# Patient Record
Sex: Male | Born: 1945 | Race: White | Hispanic: No | Marital: Married | Smoking: Former smoker
Health system: Southern US, Community
[De-identification: ages and names within clinical notes are randomized; demographics above are authoritative.]

## PROBLEM LIST (undated history)

## (undated) DIAGNOSIS — M5136 Other intervertebral disc degeneration, lumbar region: Secondary | ICD-10-CM

## (undated) DIAGNOSIS — J449 Chronic obstructive pulmonary disease, unspecified: Secondary | ICD-10-CM

## (undated) DIAGNOSIS — M4712 Other spondylosis with myelopathy, cervical region: Secondary | ICD-10-CM

## (undated) DIAGNOSIS — I2510 Atherosclerotic heart disease of native coronary artery without angina pectoris: Secondary | ICD-10-CM

## (undated) DIAGNOSIS — N529 Male erectile dysfunction, unspecified: Secondary | ICD-10-CM

## (undated) DIAGNOSIS — I219 Acute myocardial infarction, unspecified: Secondary | ICD-10-CM

## (undated) DIAGNOSIS — I10 Essential (primary) hypertension: Secondary | ICD-10-CM

## (undated) DIAGNOSIS — G4700 Insomnia, unspecified: Secondary | ICD-10-CM

## (undated) DIAGNOSIS — G629 Polyneuropathy, unspecified: Secondary | ICD-10-CM

## (undated) DIAGNOSIS — R58 Hemorrhage, not elsewhere classified: Secondary | ICD-10-CM

## (undated) DIAGNOSIS — J439 Emphysema, unspecified: Secondary | ICD-10-CM

## (undated) DIAGNOSIS — G8929 Other chronic pain: Secondary | ICD-10-CM

## (undated) DIAGNOSIS — R200 Anesthesia of skin: Secondary | ICD-10-CM

## (undated) DIAGNOSIS — M549 Dorsalgia, unspecified: Secondary | ICD-10-CM

## (undated) DIAGNOSIS — C3491 Malignant neoplasm of unspecified part of right bronchus or lung: Secondary | ICD-10-CM

## (undated) DIAGNOSIS — K284 Chronic or unspecified gastrojejunal ulcer with hemorrhage: Secondary | ICD-10-CM

## (undated) DIAGNOSIS — Z970 Presence of artificial eye: Secondary | ICD-10-CM

## (undated) DIAGNOSIS — G709 Myoneural disorder, unspecified: Secondary | ICD-10-CM

## (undated) DIAGNOSIS — C801 Malignant (primary) neoplasm, unspecified: Secondary | ICD-10-CM

## (undated) DIAGNOSIS — Z6832 Body mass index (BMI) 32.0-32.9, adult: Secondary | ICD-10-CM

## (undated) DIAGNOSIS — E119 Type 2 diabetes mellitus without complications: Secondary | ICD-10-CM

## (undated) DIAGNOSIS — M4646 Discitis, unspecified, lumbar region: Secondary | ICD-10-CM

## (undated) DIAGNOSIS — J349 Unspecified disorder of nose and nasal sinuses: Secondary | ICD-10-CM

## (undated) DIAGNOSIS — H25019 Cortical age-related cataract, unspecified eye: Secondary | ICD-10-CM

## (undated) DIAGNOSIS — K219 Gastro-esophageal reflux disease without esophagitis: Secondary | ICD-10-CM

## (undated) DIAGNOSIS — G4730 Sleep apnea, unspecified: Secondary | ICD-10-CM

## (undated) DIAGNOSIS — H547 Unspecified visual loss: Secondary | ICD-10-CM

## (undated) DIAGNOSIS — R350 Frequency of micturition: Secondary | ICD-10-CM

## (undated) DIAGNOSIS — E669 Obesity, unspecified: Secondary | ICD-10-CM

## (undated) DIAGNOSIS — I700 Atherosclerosis of aorta: Secondary | ICD-10-CM

## (undated) DIAGNOSIS — K274 Chronic or unspecified peptic ulcer, site unspecified, with hemorrhage: Secondary | ICD-10-CM

## (undated) DIAGNOSIS — D649 Anemia, unspecified: Secondary | ICD-10-CM

## (undated) DIAGNOSIS — Z720 Tobacco use: Secondary | ICD-10-CM

## (undated) DIAGNOSIS — M51369 Other intervertebral disc degeneration, lumbar region without mention of lumbar back pain or lower extremity pain: Secondary | ICD-10-CM

## (undated) HISTORY — PX: TONSILLECTOMY AND ADENOIDECTOMY: SUR1326

## (undated) HISTORY — PX: GALLBLADDER SURGERY: SHX652

## (undated) HISTORY — DX: Atherosclerotic heart disease of native coronary artery without angina pectoris: I25.10

## (undated) HISTORY — DX: Anemia, unspecified: D64.9

## (undated) HISTORY — PX: BACK SURGERY: SHX140

## (undated) HISTORY — DX: Tobacco use: Z72.0

## (undated) HISTORY — DX: Chronic obstructive pulmonary disease, unspecified: J44.9

## (undated) HISTORY — DX: Unspecified visual loss: H54.7

## (undated) HISTORY — DX: Unspecified disorder of nose and nasal sinuses: J34.9

## (undated) HISTORY — PX: FEMUR FRACTURE SURGERY: SHX633

## (undated) HISTORY — PX: CHOLECYSTECTOMY: SHX55

## (undated) HISTORY — PX: OTHER SURGICAL HISTORY: SHX169

## (undated) HISTORY — PX: ANTERIOR CERVICAL DECOMP/DISCECTOMY FUSION: SHX1161

## (undated) HISTORY — DX: Discitis, unspecified, lumbar region: M46.46

## (undated) HISTORY — PX: NASAL RECONSTRUCTION: SHX2069

## (undated) HISTORY — PX: FOOT SURGERY: SHX648

## (undated) HISTORY — DX: Gastro-esophageal reflux disease without esophagitis: K21.9

---

## 1965-03-23 HISTORY — PX: FRACTURE SURGERY: SHX138

## 1965-06-21 DIAGNOSIS — Z9289 Personal history of other medical treatment: Secondary | ICD-10-CM

## 1965-06-21 HISTORY — DX: Personal history of other medical treatment: Z92.89

## 2000-03-23 HISTORY — PX: NECK SURGERY: SHX720

## 2000-09-10 ENCOUNTER — Ambulatory Visit (HOSPITAL_COMMUNITY)
Admission: RE | Admit: 2000-09-10 | Discharge: 2000-09-10 | Payer: Self-pay | Admitting: Physical Medicine and Rehabilitation

## 2000-09-10 ENCOUNTER — Encounter: Payer: Self-pay | Admitting: Physical Medicine and Rehabilitation

## 2000-11-01 ENCOUNTER — Ambulatory Visit (HOSPITAL_COMMUNITY): Admission: RE | Admit: 2000-11-01 | Discharge: 2000-11-01 | Payer: Self-pay | Admitting: Specialist

## 2000-11-01 ENCOUNTER — Encounter: Payer: Self-pay | Admitting: Specialist

## 2000-12-09 ENCOUNTER — Encounter: Payer: Self-pay | Admitting: Specialist

## 2000-12-15 ENCOUNTER — Encounter: Payer: Self-pay | Admitting: Specialist

## 2000-12-15 ENCOUNTER — Inpatient Hospital Stay (HOSPITAL_COMMUNITY): Admission: RE | Admit: 2000-12-15 | Discharge: 2000-12-16 | Payer: Self-pay | Admitting: Specialist

## 2001-04-08 ENCOUNTER — Encounter: Admission: RE | Admit: 2001-04-08 | Discharge: 2001-04-08 | Payer: Self-pay | Admitting: Specialist

## 2001-04-08 ENCOUNTER — Encounter: Payer: Self-pay | Admitting: Specialist

## 2004-06-10 ENCOUNTER — Ambulatory Visit (HOSPITAL_BASED_OUTPATIENT_CLINIC_OR_DEPARTMENT_OTHER): Admission: RE | Admit: 2004-06-10 | Discharge: 2004-06-10 | Payer: Self-pay | Admitting: Orthopedic Surgery

## 2004-06-10 ENCOUNTER — Ambulatory Visit (HOSPITAL_COMMUNITY): Admission: RE | Admit: 2004-06-10 | Discharge: 2004-06-10 | Payer: Self-pay | Admitting: Orthopedic Surgery

## 2004-06-22 ENCOUNTER — Emergency Department (HOSPITAL_COMMUNITY): Admission: EM | Admit: 2004-06-22 | Discharge: 2004-06-22 | Payer: Self-pay | Admitting: Emergency Medicine

## 2006-12-10 ENCOUNTER — Ambulatory Visit: Payer: Self-pay | Admitting: Unknown Physician Specialty

## 2007-04-12 ENCOUNTER — Emergency Department: Payer: Self-pay | Admitting: Emergency Medicine

## 2007-04-12 ENCOUNTER — Other Ambulatory Visit: Payer: Self-pay

## 2007-04-17 ENCOUNTER — Emergency Department (HOSPITAL_COMMUNITY): Admission: EM | Admit: 2007-04-17 | Discharge: 2007-04-17 | Payer: Self-pay | Admitting: Emergency Medicine

## 2007-10-04 ENCOUNTER — Ambulatory Visit (HOSPITAL_BASED_OUTPATIENT_CLINIC_OR_DEPARTMENT_OTHER): Admission: RE | Admit: 2007-10-04 | Discharge: 2007-10-04 | Payer: Self-pay | Admitting: Orthopedic Surgery

## 2008-03-13 ENCOUNTER — Ambulatory Visit: Payer: Self-pay | Admitting: Urology

## 2008-10-04 ENCOUNTER — Ambulatory Visit: Payer: Self-pay | Admitting: Internal Medicine

## 2010-06-05 ENCOUNTER — Ambulatory Visit: Payer: Self-pay | Admitting: Vascular Surgery

## 2010-08-05 NOTE — Op Note (Signed)
NAMEORVA, GWALTNEY              ACCOUNT NO.:  [id number]   MEDICAL RECORD NO.:  [id number]          PATIENT TYPE:  AMB   LOCATION:  DSC                          FACILITY:  [facility]   PHYSICIAN:  Leonides Grills, M.D.     DATE OF BIRTH:  24-Jan-1946   DATE OF PROCEDURE:  10/04/2007  DATE OF DISCHARGE:                               OPERATIVE REPORT   PREOPERATIVE DIAGNOSES:  1. Left insertional Achilles tendinitis, with calcification.  2. Left tight gastrocnemius.  3. Left Haglund deformity.   POSTOPERATIVE DIAGNOSES:  1. Left insertional Achilles tendinitis, with calcification.  2. Left tight gastrocnemius.  3. Left Haglund deformity.   OPERATION:  1. Left excision, Haglund deformity.  2. Left gastrocnemius slide.  3. Left primary repaired of Achilles tendon.   ANESTHESIA:  General.   SURGEON:  Leonides Grills, MD   ASSISTANT:  Richardean Canal, PA-C   ESTIMATED BLOOD LOSS:  Minimal.   TOURNIQUET TIME:  Approximately 1 hour 15 minutes.   COMPLICATIONS:  None.   DISPOSITION:  Stable to PAR.   INDICATIONS:  This is a 64 year old male who has had longstanding left  posterior heel pain that was interfering with his life to the point  where he could not do what he wants to do despite conservative  management.  He was consented to the above procedure.  All risks, which  include infection, nerve or vessel injury, Achilles tendon rupture,  persistent pain, worsening pain, prolonged recovery, and wound  complications were all explained.  Questions were encouraged and  answered.   OPERATION:  The patient was brought to the operating room and placed in  supine position initially after adequate general endotracheal anesthesia  was administered as well as popliteal block and Ancef 1 g IV piggyback.  The patient was then placed in a prone position.  All bony prominences  were well padded.  Chest bolsters were placed.  The left lower extremity  was prepped and draped in sterile manner with  a proximally placed thigh  tourniquet.  We started the procedure with a longitudinal incision in  the medial aspect of gastrocnemius muscle tendinous junction.  Dissection was carried down through skin.  Hemostasis was obtained.  Fascia was opened in line with the incision.  Conjoined region was then  developed between the gastrocnemius and soleus.  Soft tissue was  elevated off the posterior aspect of gastrocnemius.  Sural nerve was  identified and protected posteriorly.  Gastrocnemius was then released  with a curved Mayo scissors.  This had excellent release of tight  gastrocnemius.  The area was copiously irrigated with normal saline.  Subcu was closed with 3-0 Vicryl.  Skin was closed with 4-0 Monocryl  subcuticular stitch.  Steri-Strips were applied.  Limb was then gravity  exsanguinated and tourniquet was elevated to 290 mmHg.  An anterolateral  and anteromedial incision over the Achilles tendon was then made.  Dissection was carried down through skin.  Hemostasis was obtained.  Achilles tendon was then incised both anteromedially and anterolaterally  and was elevated off the calcaneal tuber over the Haglund deformity.  This was  then removed with a sagittal saw through, both the medial and  lateral wounds.  We then carefully dissected out the calcification  within the Achilles tendon.  This was done sharply with a scalpel as  well as a Therapist, nutritional.  We then removed this with a curved 1/4-inch  osteotome and rongeur.  This was then palpated and noted to be  adequately decompressed.  The area was copiously irrigated with normal  saline.  We then placed two 5.5-mm bioabsorbable Arthrotec suture  anchors with #2 FiberWire near the insertion of the Achilles tendon and  this was done through the standard Arthrotec technique and then we used  a crisscross type of suture configuration similar to repairs on rotator  cuff repairs with the 4 suture limbs from the 2 absorbable suture   anchors respectively.  We then placed two PushLock suture anchors using  a knotless technique.  This was then palpated through the skin and this  was adequately decompressed.  There were no palpable knots and no  palpable prominence.  Tourniquet was deflated and hemostasis was  obtained.  Also, of note, care was taken to tunnel the sutures directly  on top of the Achilles tendon and once the tourniquet was deflated and  hemostasis was obtained, the anteromedial and anterolateral edges of the  Achilles tendon were repaired with 2-0 FiberWire stitch.  Subcu was  closed with 3-0 Vicryl.  Skin was closed with 4-0 nylon over both  wounds.  Sterile dressing was applied.  Modified Jones dressing was  applied in gravity equinus.  The patient was stable to the PAR.      Leonides Grills, M.D.  Electronically Signed     PB/MEDQ  D:  10/04/2007  T:  10/05/2007  Job:  [id number]

## 2010-08-08 NOTE — Op Note (Signed)
[facility]  Patient:    Howard Maxwell, Howard Maxwell Visit Number: [id number] MRN: [id number]          Service Type: SUR Location: [id number] Attending Physician:  Lubertha South Dictated by:   Kerrin Champagne, M.D. Proc. Date: 12/15/00 Admit Date:  12/15/2000 Discharge Date: 12/16/2000                             Operative Report  PREOPERATIVE DIAGNOSES:  Left C3-4 uncovertebral spur with left C4-C5 nerve root impingement, left C6-7 herniated nucleus pulposus with left C7 nerve root impingement. 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  PROCEDURE:  Anterior diskectomy and fusion at C6-7 using a 28 mm EBI plate with a 9 mm long graft dense cancellous bone graft. Anterior diskectomy and fusion at C3-4 using a 26 mm EBI plate and screws and an 8 mm long graft. Two 14 mm screws were used throughout the neck. A 4.0 screw at each segment with the exception of the left lower C4 screwing being a 5 mm screw.  SURGEON:  Kerrin Champagne, M.D.  ASSISTANT:  Beverely Low, M.D.  ANESTHESIA:  GOT, Dr. Rica Mast.  ESTIMATED BLOOD LOSS:  150 cc.  DRAINS:  TLS 7 mm x 1 left neck and Foley to straight drain.  BRIEF CLINICAL NOTE:  The patient is a 65 year old man who has had persistent neck pain and radiation to the left upper extremity. He has undergone numerous attempts at selective nerve root block without relief of the pain. A cervical MRI demonstrating significant left C3-4 spondylosis changes with spur affecting the left foramen as well as flattening the left side of the cord and a C6-7 disk degeneration. MRI scan was followed by a myelogram which demonstrated  disk herniation, left sided C6-7 both soft and hard disk material and a left C3-4 hard disk protrusion. EMGs, nerve conduction studies demonstrated no significant cervical radiculopathy. No sign of carpal tunnel syndrome. After attempts at initial management with selective nerve blocks, physical therapy felt that surgical intervention was necessary and diminished the patients pain and improved function. The patient was brought to the operating room to undergo a left C3-4 foraminotomy, decompression and uncovertebral spondylosis changes with fusion and a left sided C7 nerve root decompression with anterior diskectomy and fusion at this site.  INTRAOPERATIVE FINDINGS:  The patient was found to have a very large spur on the left side at C3-4 affecting the left C4 nerve root and causing mass effect on the left side of the cervical cord. He was found to have hard disk protrusion with soft disk protrusion at the left C6-7 level causing primarily C7 nerve root entrapment.  DESCRIPTION OF PROCEDURE:  After adequate general anesthesia, the patient in the beach chair position at the neck and 5 pounds cervical halter traction with Mayfield horseshoe and a roll transverse in the midline between the shoulder blades to allow for exposure of the neck. Standard prep with Duraprep solution, arms well padded at the sides. Draped in the usual manner, iodine impregnated Vidrape with standard preoperative antibiotics. The incision over the left anterior sternocleidomastoid approximately 9-10 cm in length through the skin and subcutaneous layers  down to the platysmal layer. Electrocautery used to control bleeders. The platysmal layer then incised in line with the skin incision. The interval had developed between the trachea and esophagus medially and across the sheath laterally to the anterior aspect of the cervical spine at the expected C6-7 level.  Prevertebral fascia then teased across the midline using a  Kidner dissector and spinal needle inserted at the expected C6-7 level on the left side. Interval between the trachea and esophagus then developed more superiorly using blunt dissection to the anterior aspect of the cervical spine where spurs were evident.  This was felt to represent the C3-4 level. Another spinal needle was inserted at this level. Once the spinal needles were inserted with the sheath attached only allowing about a centimeter of the needle to be inserted. Intraoperative C-arm fluoro was then used to inspect the neck and examine the neck. On my review, it was observed that the upper needle was at the C3-4 level. The lower needle was found to be present at the C5-6 level. With this then being the case then the patient had removal of the upper needle under direct observation using hand held Clowards and a small portion of the disk excised using a 15 blade scalpel and pituitary rongeur. The lower needle was left in place and examination of the neck, a Kidner dissector was used to tease and expose further caudally down to the next level. A second spinal needle was then inserted and C-arm fluoro again used on lateral view to demonstrate that this was at the C6-7 level. With this being the case then the C-arm was removed from the field, the upper spinal needle at the lower segment C5-6 was then removed under direct observation.  The lower needle was then removed under careful observation and retraction using the hand held Clowards and a 15 blade scalpel used to incise the disk at the C5-6 level. The disk was found to be quite narrowed with anterior spurs evident.  This being the case then the medial border of the longus colli muscle was carefully freed up bilaterally using electrocautery and Key elevator. Bipolar electrocautery used to control bleeders. A McCullough retractor was then inserted with the foot of the retractor beneath the medial border of the longus colli muscle  which provided exposure of the anterior aspect of the cervical spine at the C6-7 level. A high speed bur was then brought into the  field and then under loop magnification, the anterior lip spurs were excised using a high speed bur and the disk space identified. Pituitary rongeurs and curettes then used to identify the disk space and remove disk material in the anterior two-thirds of the disk.  The Casper distraction system was inserted using a 14 mm screw post. These were inserted in both the vertebral body of C7 and C6 and distraction obtained across the intervertebral disk space. The operating room microscope was then draped and brought onto the field. Under the operating room microscope then the microcurettes were then used to excise the cartilaginous endplates over the superior aspect of C7 and the inferior aspect of C6. The disk was then removed back to the posterior longitudinal ligament.  A 1 mm Kerrison was then introduced over the posterior aspect of the posterior superior lip of C7 and then this bone bridge was resected from the right side to left side and the posterior annular fibers resected similarly.  Foraminotomy then performed in the left neuroforamen using 1 mm and then 2 mm  Kerrisons opening the neuroforamen widely and decompressing the left C7 nerve root. A titanium nerve hook was then used to free up the left side of the posterior longitudinal ligament and then to carefully peel it off the left C7 nerve root and the left side of the cervical cord. There was bony attachment to the posterior longitudinal ligament and this was resected using both titanium nerve hook and the micropituitary rongeur. Following this, the left neuroforamen and the left sided thecal sac was found to be completely free. The high speed bur was then used to carefully bur the endplates at the both the inferior aspect of C6 and superior aspect of C7. Sounding was performed in the disk space and a  9 mm long graft was found to fit best. This was then inserted after careful inspection of the depth demonstrated a depth of nearly 15 mm at the C6-7 level. The graft was inserted, impacted into place under distraction. Distraction was then released ______ holes and then coated with bone wax to obtain hemostasis. The anterior aspect of the cervical spine and the anterior aspect of C6 and C7 was then exposed using electrocautery to cauterize soft tissue anteriorly. Then debriding this using a high speed bur over both the mid and the lower portion of C6 and the upper and mid portion of C7. The expected height for the intrascrew distance was measured using bone wax applied to cottonoid string placed over the anterior aspect of the cervical spine measuring approximately 24 mm between screw holes. A 28 mm plate was chosen. The plate was then placed against the anterior aspect of the cervical spine at the C6-7 level after burring allowed for removal of osteophytes and smooth opposition of the plate to the anterior aspect of the cervical spine at this level. The plate was fixed in the upper left side with a retaining screw. Then using the drill sleeve, a drill hole was placed first in the left lower side 14 mm in depth and a 14 mm 4.0 screw placed. This was self tapping. Similarly the lower right side was placed and after drilling 14 mm, a 4.0 screw was placed. Next, the single retaining screw was removed, 5 p pounds cervical traction released and the two upper screws were then drilled in place allowing for compression of the graft and excellent opposition of the plate against the anterior aspect of the cervical spine. Irrigation was then performed and a sponge placed into the incision to allow for further inspection of this region later. Attention was then directed to the C3-4 level where hand held Clowards were inserted. The medial border of the longus colli muscle freed up bilaterally and then  the [facility] retractor inserted over the foot of the retractor beneath the medial border of the longus colli muscle bilaterally. Excellent exposure was obtained. Screw posts were then inserted in the anterior aspect of the vertebral body of C3 and C4 and distraction obtained across the intervertebral disk space. A 15 blade scalpel was then used to incise the anterior aspect of the disk space at the C3-4 level. Pituitary rongeurs and currettes were then used to debride the disk space of the intervertebral disk material.  The operating room microscope was then brought into the field under direct observation and the endplates both in the inferior aspect of C3 and over the superior aspect of C4 were debrided of cartilaginous material down to bone.  The high speed bur was used to further open the intervertebral disk space allowing  exposure of the posterior aspect of the disk space. A 1 mm Kerrison was then able to be introduced and placed over the posterior superior lip of C4 and used to resect bony bar here towards the right side to the midline and then brought back towards the left side resecting spur off the posterior superior aspect of C4. This was then used to further enter the neuroforamen on the left side over the pedicle and left C4 performing an initial foraminotomy on this side. The high speed bur was then used to thin the posterior aspect of the posterior inferior aspect of the vertebral body of C3. This was used to thin the spur off the left side posteriorly and left posterior uncovertebral joint. Once this was thin, a house curette as well as 1 mm Kerrison was then used to resect the spur along the left side decompressing the left neuroforamen and left C4 nerve root. The left side of the thecal sac was found to be completely decompressed. Irrigation was performed. The high speed bur was then used to carefully bur to bleeding bony endplates both in the inferior aspect of C3 and  the superior aspect of C4. Sounding the intervertebral disk space measured 8 mm long. Irrigation again was performed. Careful inspection demonstrated no soft tissue including retropulsion with insertion of the graft and a distraction graft was then inserted back into place. The depth of the intervertebral disk space was measured at 18 mm using Cloward depth gauge. This was done prior to insertion of graft. Following insertion of the graft,  distraction was released, the screw posts removed at C3 and C4. Bone wax applied to the bleeding screw post holes to obtain hemostasis. The high speed bur then used to carefully bur the anterior osteophytes off the anterior aspect of the vertebral body of C4 and the anterior aspect of C3. Cautery was used to control bleeders.  The height of the expected plate was then measured using similarly cottonoid string and a 26 mm plate was chosen. The plate placed across the anterior aspect of the cervical spine at the C3-4 level. A single retention screw placed on the left side at the C4 level. This held the blade in place and a screw was then placed on the right side at the C4 level using a 14 mm drill and then a 4.0, 14 mm screw was placed. Distraction was released on the intervertebral disk space, 5 pounds cervical traction released and the two screws then placed at the C3 level superiorly both right and left side first drilling and then using appropriate self tapping 4.0, 14 mm screw. Then the retention screws removed on the left side at C4 and a drill performed using a 14 mm drill. A 5.0 mm screw was then placed on the left side at the this level. All of these obtained excellent purchase. A [place]field 4 was then used to carefully ensure that the washers all spun around the heads of the screws to ensure that they captured appropriately and locked to the plate appropriately. This was done at C3-4 and then down at C6-7 as well. Irrigation was  performed. Intraoperative radiograph using a C-arm fluoro demonstrated the plates at Z6-1 in excellent position and alignment and bone graft showing excellent distraction of the disk space without evidence of retropulsion. At C6-7 the plates and screws appear to be in excellent position and alignment with no evidence of screw penetration in the canal and the bone graft appeared to be in good  position and alignment without evidence of retropulsion. Irrigation was performed above C3-4 and C6-7. There was no active bleeding evident. A single 7.0 mm TLS drain was then placed in the depth of the incision down deeply superiorly exiting out a stab incision over the anterior lower aspect of the neck and left side. The platysmal layer was then approximated to the left side using interrupted 2-0 Vicryl sutures. The deep subcu layer was approximated with interrupted 2-0 Vicryl sutures and 3-0 Vicryl sutures and the more superficial layers with interrupted 3-0 Vicryl suture and the skin closed with a running subcu stitch of 4-0 Vicryl. Tinctured Benzoin and Steri-Strips applied, 4 x 4s affixed to the skin with hypofix tape, TLS drain charge. The patient placed in a [place] collar and he was then reactivated and returned to the recovery room in satisfactory condition. All instrument and sponge counts were correct. Dictated by:   Kerrin Champagne, M.D. Attending Physician:  Lubertha South DD:  12/15/00 TD:  12/16/00 Job: [id number]
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
## 2010-08-08 NOTE — Op Note (Signed)
Howard Maxwell, FORE              ACCOUNT NO.:  [id number]   MEDICAL RECORD NO.:  [id number]          PATIENT TYPE:  AMB   LOCATION:  DSC                          FACILITY:  [facility]   PHYSICIAN:  Leonides Grills, M.D.     DATE OF BIRTH:  May 15, 1945   DATE OF PROCEDURE:  06/10/2004  DATE OF DISCHARGE:                                 OPERATIVE REPORT   PREOPERATIVE DIAGNOSES:  1.  Right Haglund deformity.  2.  Right tight gastrocnemius.  3.  Right Achilles tendon tendinosis/rupture.   POSTOPERATIVE DIAGNOSES:  1.  Right Haglund deformity.  2.  Right tight gastrocnemius.  3.  Right Achilles tendon tendinosis/rupture.   OPERATION:  1.  Excision, right Haglund deformity.  2.  Stress x-rays, right ankle.  3.  Right gastrocnemius slide.  4.  Primary repair, right Achilles tendon.   ANESTHESIA:  General.   SURGEON:  Durene Romans. Lestine Box, MD   ASSISTANT:  Lianne Cure, P.A.   ESTIMATED BLOOD LOSS:  Minimal.   TOURNIQUET TIME:  Approximately an hour.   COMPLICATIONS:  None.   DISPOSITION:  Stable to the PAR.   INDICATIONS:  This is a 65 year old male who has had chronic posterior right  heel pain that was interfering with his life to the point that he cannot do  what he wants to do despite conservative management.  He was consented for  the above procedure.  All risks, which include infection, nerve or vessel  injury, Achilles tendon rupture, persistent pain, worsening pain, prolonged  recovery, weakness and contracture, were all explained, questions were  encouraged and answered.   OPERATION:  The patient was brought to the operating room and placed in the  supine position after adequate general endotracheal tube anesthesia was  administered as well as Ancef 1 g IV piggyback.  The patient was then placed  in a prone position and the right lower extremity was then prepped and  draped in a sterile manner over a proximally-placed thigh tourniquet.  We  started the procedure with  a longitudinal incision over the medial  gastrocnemius muscle-tendinous junction.  Dissection was carried down  through skin and hemostasis was obtained.  The fascia was opened in line  with the incision and soft tissue was elevated off the posterior aspect of  the gastrocnemius tendon.  The conjoint region was then developed between  the gastroc and soleus musculature.  The sural nerve was identified and  protected in the posterior aspect of the knee with the knee retractor.  The  gastrocnemius was then released with a Mayo scissors.  That released the  tight gastrocnemius.  The area was copiously irrigated and the subcu was  closed with 3-0 Vicryl, the skin was closed with 4-0 Monocryl subcuticular  stitch.  Steri-Strips were applied.  The tourniquet was then elevated to 290  mmHg.  Two longitudinal incisions on either side of the Achilles tendon were  then made.  Dissection was carried down directly to bone.  Tendon and soft  tissue were elevated off the posterior aspect of the Haglund area, i.e.,  calcaneal tuber.  The  retrocalcaneal bursa was then removed, as well as  calcification within the Achilles tendon.  The Achilles tendon was  completely debrided during this procedure.  This took quite some time.  We  then obtained a sagittal saw and then cut the Haglund's deformity out using  a sagittal saw as well as a straight osteotome.  We then obtained stress x-  rays in the lateral plane that showed that this was adequately decompressed  and no gross movement of loose bone in this area.  This was then copiously  irrigated with normal saline.  We then assessed the Achilles tendon and it  was quite diseased, and portions of it were torn.  Because of this, we  elected to repair it down to the Haglund deformity excision.  We then  obtained an Arthrotec 5.5 mm suture anchor and placed this with standard  technique with #2 Fibrewire.  We put two of these in, one medially and one  laterally.   This had excellent repair of the tendon back down to the exposed  cancellous bone.  We then, once we tied this down, we then buried the stitch  using Arthrotec slide anchors through a percutaneous longitudinal slit in  the Achilles tendon and used a crisscross hatch technique to help hold the  Achilles tendon down flush to the exposed calcaneal tuber.  This was done  through subcutaneous tunnel on the posterior aspect of the Achilles tendon.  This was again placed in the standard technique and had an outstanding  repair.  There was no dimpling of the skin.  The Achilles was flush down.  There was no prominence posteriorly.  The area was copiously irrigated with  normal saline.  The tourniquet was deflated and hemostasis obtained.  The  skin was closed with 4-0 nylon suture.  Sterile dressings applied.  Modified  Jones dressing applied with the ankle in gravity equinus.  The patient was  stable to the PAR.      PB/MEDQ  D:  06/10/2004  T:  06/10/2004  Job:  [id number]

## 2010-12-18 LAB — POCT I-STAT, CHEM 8
BUN: 12
Calcium, Ion: 1 — ABNORMAL LOW
Chloride: 104
Creatinine, Ser: 1
Glucose, Bld: 149 — ABNORMAL HIGH
HCT: 46
Hemoglobin: 15.6
Potassium: 3.9
Sodium: 138
TCO2: 25

## 2010-12-18 LAB — POCT HEMOGLOBIN-HEMACUE: Hemoglobin: 16.5

## 2012-03-10 ENCOUNTER — Ambulatory Visit: Payer: Self-pay | Admitting: Unknown Physician Specialty

## 2012-10-19 ENCOUNTER — Encounter (HOSPITAL_COMMUNITY): Payer: Self-pay | Admitting: *Deleted

## 2012-10-19 DIAGNOSIS — E119 Type 2 diabetes mellitus without complications: Secondary | ICD-10-CM | POA: Insufficient documentation

## 2012-10-19 DIAGNOSIS — M545 Low back pain, unspecified: Secondary | ICD-10-CM | POA: Insufficient documentation

## 2012-10-19 DIAGNOSIS — Z79899 Other long term (current) drug therapy: Secondary | ICD-10-CM | POA: Insufficient documentation

## 2012-10-19 DIAGNOSIS — F17200 Nicotine dependence, unspecified, uncomplicated: Secondary | ICD-10-CM | POA: Insufficient documentation

## 2012-10-19 DIAGNOSIS — R52 Pain, unspecified: Secondary | ICD-10-CM | POA: Insufficient documentation

## 2012-10-19 DIAGNOSIS — I10 Essential (primary) hypertension: Secondary | ICD-10-CM | POA: Insufficient documentation

## 2012-10-19 LAB — CBC WITH DIFFERENTIAL/PLATELET
Basophils Absolute: 0.1 10*3/uL (ref 0.0–0.1)
Basophils Relative: 1 % (ref 0–1)
Eosinophils Absolute: 0.3 10*3/uL (ref 0.0–0.7)
Eosinophils Relative: 4 % (ref 0–5)
HCT: 45 % (ref 39.0–52.0)
Hemoglobin: 15.2 g/dL (ref 13.0–17.0)
Lymphocytes Relative: 32 % (ref 12–46)
Lymphs Abs: 2.6 10*3/uL (ref 0.7–4.0)
MCH: 31.5 pg (ref 26.0–34.0)
MCHC: 33.8 g/dL (ref 30.0–36.0)
MCV: 93.4 fL (ref 78.0–100.0)
Monocytes Absolute: 0.6 10*3/uL (ref 0.1–1.0)
Monocytes Relative: 7 % (ref 3–12)
Neutro Abs: 4.7 10*3/uL (ref 1.7–7.7)
Neutrophils Relative %: 57 % (ref 43–77)
Platelets: 205 10*3/uL (ref 150–400)
RBC: 4.82 MIL/uL (ref 4.22–5.81)
RDW: 13.7 % (ref 11.5–15.5)
WBC: 8.3 10*3/uL (ref 4.0–10.5)

## 2012-10-19 LAB — COMPREHENSIVE METABOLIC PANEL
ALT: 16 U/L (ref 0–53)
AST: 15 U/L (ref 0–37)
Albumin: 3.9 g/dL (ref 3.5–5.2)
Alkaline Phosphatase: 86 U/L (ref 39–117)
BUN: 14 mg/dL (ref 6–23)
CO2: 27 mEq/L (ref 19–32)
Calcium: 9.3 mg/dL (ref 8.4–10.5)
Chloride: 100 mEq/L (ref 96–112)
Creatinine, Ser: 0.96 mg/dL (ref 0.50–1.35)
GFR calc Af Amer: >90 mL/min (ref >90)
GFR calc non Af Amer: 84 mL/min — ABNORMAL LOW (ref >90)
Glucose, Bld: 127 mg/dL — ABNORMAL HIGH (ref 70–99)
Potassium: 3.7 mEq/L (ref 3.5–5.1)
Sodium: 138 mEq/L (ref 135–145)
Total Bilirubin: 0.3 mg/dL (ref 0.3–1.2)
Total Protein: 7 g/dL (ref 6.0–8.3)

## 2012-10-19 LAB — URINALYSIS, ROUTINE W REFLEX MICROSCOPIC
Glucose, UA: NEGATIVE mg/dL
Hgb urine dipstick: NEGATIVE
Ketones, ur: 15 mg/dL — AB
Leukocytes, UA: NEGATIVE
Nitrite: NEGATIVE
Protein, ur: NEGATIVE mg/dL
Specific Gravity, Urine: 1.026 (ref 1.005–1.030)
Urobilinogen, UA: 1 mg/dL (ref 0.0–1.0)
pH: 6 (ref 5.0–8.0)

## 2012-10-19 NOTE — ED Notes (Signed)
Pt c/o lower back pain for the past 6 hours..  No n or v

## 2012-10-20 ENCOUNTER — Emergency Department (HOSPITAL_COMMUNITY)
Admission: EM | Admit: 2012-10-20 | Discharge: 2012-10-20 | Disposition: A | Discharge status: Home / Self Care | Payer: Medicare Other | Attending: Emergency Medicine | Admitting: Emergency Medicine

## 2012-10-20 DIAGNOSIS — M545 Low back pain, unspecified: Secondary | ICD-10-CM

## 2012-10-20 HISTORY — DX: Essential (primary) hypertension: I10

## 2012-10-20 HISTORY — DX: Type 2 diabetes mellitus without complications: E11.9

## 2012-10-20 MED ORDER — OXYCODONE-ACETAMINOPHEN 5-325 MG PO TABS: 1.0000 | ORAL_TABLET | ORAL | Status: DC | PRN

## 2012-10-20 MED ORDER — DIAZEPAM 5 MG PO TABS
10.0000 mg | ORAL_TABLET | Freq: Once | ORAL | Status: AC
Start: 2012-10-20 — End: 2012-10-20
  Administered 2012-10-20: 10 mg via ORAL
  Filled 2012-10-20: qty 2

## 2012-10-20 MED ORDER — OXYCODONE-ACETAMINOPHEN 5-325 MG PO TABS
1.0000 | ORAL_TABLET | Freq: Once | ORAL | Status: AC
  Administered 2012-10-20: 1 via ORAL
  Filled 2012-10-20: qty 1

## 2012-10-20 MED ORDER — IBUPROFEN 800 MG PO TABS
800.0000 mg | ORAL_TABLET | Freq: Once | ORAL | Status: AC
  Administered 2012-10-20: 800 mg via ORAL
  Filled 2012-10-20: qty 1

## 2012-10-20 MED ORDER — DIAZEPAM 10 MG PO TABS: 10.0000 mg | ORAL_TABLET | Freq: Three times a day (TID) | ORAL | Status: DC | PRN

## 2012-10-20 NOTE — ED Provider Notes (Signed)
CSN: [id number]     Arrival date & time 10/19/12  2132 History     First MD Initiated Contact with Patient 10/20/12 0026     Chief Complaint  Patient presents with  . Back Pain   (Consider location/radiation/quality/duration/timing/severity/associated sxs/prior Treatment) Patient is a 67 y.o. male presenting with back pain. The history is provided by the patient.  Back Pain He states that he took a nap this afternoon when he woke up, he had severe pain in his right paralumbar area which radiated down into the right thigh. At rest, pain is dull and throbbing. When he tries to move, pain gets worse and becomes sharp. There is no associated numbness or tingling or weakness. He denies any bowel or bladder dysfunction. He has had back pain in the past and has had injections in his back for arthritis of the spine. He denies any recent trauma. He states he only lifting he did was putting a light weight wheelchair into his car.  Past Medical History  Diagnosis Date  . Diabetes mellitus without complication   . Hypertension    History reviewed. No pertinent past surgical history. No family history on file. History  Substance Use Topics  . Smoking status: Current Every Day Smoker  . Smokeless tobacco: Not on file  . Alcohol Use: No    Review of Systems  Musculoskeletal: Positive for back pain.  All other systems reviewed and are negative.    Allergies  Review of patient's allergies indicates not on file.  Home Medications   Current Outpatient Rx  Name  Route  Sig  Dispense  Refill  . amLODipine (NORVASC) 5 MG tablet   Oral   Take 5 mg by mouth daily.         Marland Kitchen etodolac (LODINE) 400 MG tablet   Oral   Take 400 mg by mouth 2 (two) times daily.         . hydrochlorothiazide (HYDRODIURIL) 25 MG tablet   Oral   Take 25 mg by mouth daily.         Marland Kitchen lisinopril (PRINIVIL,ZESTRIL) 20 MG tablet   Oral   Take 20 mg by mouth 2 (two) times daily.         . metFORMIN  (GLUCOPHAGE) 500 MG tablet   Oral   Take 500 mg by mouth 2 (two) times daily with a meal.         . Multiple Vitamin (MULTIVITAMIN WITH MINERALS) TABS   Oral   Take 1 tablet by mouth daily.         Marland Kitchen omeprazole (PRILOSEC) 20 MG capsule   Oral   Take 20 mg by mouth daily.         . Probiotic Product (PROBIOTIC DAILY PO)   Oral   Take 1 tablet by mouth 2 (two) times daily.          BP 156/68  Pulse 60  Temp(Src) 98.6 F (37 C)  Resp 22  SpO2 98% Physical Exam  Nursing note and vitals reviewed.  67 year old male, resting comfortably and in no acute distress. Vital signs are significant for hypertension with blood pressure 136/68, and tachypnea with respiratory rate of 22. Oxygen saturation is 98%, which is normal. Head is normocephalic and atraumatic. PERRLA, EOMI. Oropharynx is clear. Neck is nontender and supple without adenopathy or JVD. Back is nontender in the midline, but there is right paralumbar tenderness and spasm. Straight leg raise is positive on the right at  30. There is a positive crossed straight leg raise on the left at 30. Lungs are clear without rales, wheezes, or rhonchi. Chest is nontender. Heart has regular rate and rhythm without murmur. Abdomen is soft, flat, nontender without masses or hepatosplenomegaly and peristalsis is normoactive. Extremities have no cyanosis or edema, full range of motion is present. Skin is warm and dry without rash. Neurologic: Mental status is normal, cranial nerves are intact, there are no motor or sensory deficits.  ED Course   Procedures (including critical care time)  Results for orders placed during the hospital encounter of 10/20/12  URINALYSIS, ROUTINE W REFLEX MICROSCOPIC      Result Value Range   Color, Urine YELLOW  YELLOW   APPearance CLEAR  CLEAR   Specific Gravity, Urine 1.026  1.005 - 1.030   pH 6.0  5.0 - 8.0   Glucose, UA NEGATIVE  NEGATIVE mg/dL   Hgb urine dipstick NEGATIVE  NEGATIVE    Bilirubin Urine LARGE (*) NEGATIVE   Ketones, ur 15 (*) NEGATIVE mg/dL   Protein, ur NEGATIVE  NEGATIVE mg/dL   Urobilinogen, UA 1.0  0.0 - 1.0 mg/dL   Nitrite NEGATIVE  NEGATIVE   Leukocytes, UA NEGATIVE  NEGATIVE  CBC WITH DIFFERENTIAL      Result Value Range   WBC 8.3  4.0 - 10.5 K/uL   RBC 4.82  4.22 - 5.81 MIL/uL   Hemoglobin 15.2  13.0 - 17.0 g/dL   HCT 16.1  09.6 - 04.5 %   MCV 93.4  78.0 - 100.0 fL   MCH 31.5  26.0 - 34.0 pg   MCHC 33.8  30.0 - 36.0 g/dL   RDW 40.9  81.1 - 91.4 %   Platelets 205  150 - 400 K/uL   Neutrophils Relative % 57  43 - 77 %   Neutro Abs 4.7  1.7 - 7.7 K/uL   Lymphocytes Relative 32  12 - 46 %   Lymphs Abs 2.6  0.7 - 4.0 K/uL   Monocytes Relative 7  3 - 12 %   Monocytes Absolute 0.6  0.1 - 1.0 K/uL   Eosinophils Relative 4  0 - 5 %   Eosinophils Absolute 0.3  0.0 - 0.7 K/uL   Basophils Relative 1  0 - 1 %   Basophils Absolute 0.1  0.0 - 0.1 K/uL  COMPREHENSIVE METABOLIC PANEL      Result Value Range   Sodium 138  135 - 145 mEq/L   Potassium 3.7  3.5 - 5.1 mEq/L   Chloride 100  96 - 112 mEq/L   CO2 27  19 - 32 mEq/L   Glucose, Bld 127 (*) 70 - 99 mg/dL   BUN 14  6 - 23 mg/dL   Creatinine, Ser 7.82  0.50 - 1.35 mg/dL   Calcium 9.3  8.4 - 95.6 mg/dL   Total Protein 7.0  6.0 - 8.3 g/dL   Albumin 3.9  3.5 - 5.2 g/dL   AST 15  0 - 37 U/L   ALT 16  0 - 53 U/L   Alkaline Phosphatase 86  39 - 117 U/L   Total Bilirubin 0.3  0.3 - 1.2 mg/dL   GFR calc non Af Amer 84 (*) >90 mL/min   GFR calc Af Amer >90  >90 mL/min    1. Acute lumbar back pain     MDM  Acute lumbar strain with sciatica. There is no indication for emergent imaging. He is given a dose of  diazepam, ibuprofen, and oxycodone-acetaminophen and will be reassessed.  He got very good relief of pain with the above-noted medications. He is on etodolac at home but has not been taking it on a routine basis. He is advised to start taking it twice a day and is sent home with prescriptions  for oxycodone acetaminophen, and diazepam.  Dione Booze, MD 10/20/12 [phone]

## 2012-12-27 ENCOUNTER — Encounter: Payer: Self-pay | Admitting: Podiatrist

## 2012-12-27 NOTE — Progress Notes (Deleted)
Subjective:     Patient ID: Howard Maxwell, male   DOB: 10/28/1945, 67 y.o.   MRN: [id number]  HPI   Review of Systems     Objective:   Physical Exam     Assessment:     ***    Plan:     ***

## 2012-12-27 NOTE — Progress Notes (Signed)
Patient picked up 2nd pair of orthotics.

## 2013-02-07 ENCOUNTER — Ambulatory Visit (INDEPENDENT_AMBULATORY_CARE_PROVIDER_SITE_OTHER): Payer: 59 | Admitting: Podiatrist

## 2013-02-07 ENCOUNTER — Encounter (INDEPENDENT_AMBULATORY_CARE_PROVIDER_SITE_OTHER): Payer: Self-pay

## 2013-02-07 ENCOUNTER — Encounter: Payer: Self-pay | Admitting: Podiatrist

## 2013-02-07 VITALS — BP 132/83 | HR 61 | Resp 18

## 2013-02-07 DIAGNOSIS — M216X9 Other acquired deformities of unspecified foot: Secondary | ICD-10-CM

## 2013-02-07 DIAGNOSIS — Q828 Other specified congenital malformations of skin: Secondary | ICD-10-CM

## 2013-02-07 DIAGNOSIS — M216X2 Other acquired deformities of left foot: Secondary | ICD-10-CM

## 2013-02-07 MED ORDER — HYDROCODONE-ACETAMINOPHEN 5-325 MG PO TABS: 1.0000 | ORAL_TABLET | ORAL | Status: DC | PRN

## 2013-02-07 NOTE — Progress Notes (Signed)
  Subjective: Patient presents today for followup of calloused area submetatarsal 3, 4 of the left foot. He states it's been real sore and he has a friend who referred him to Dr. Lajoyce Corners whom he did see for a consult who recommended a shortening metatarsal osteotomy of metatarsals 2, 3, 4 all the left foot. He relates that the orthotics have helped some however he still gets the pain occurs after the callus gets large. Objective: Neurovascular status is intact and unchanged to the left foot. A large hyperkeratotic lesion is present submetatarsal 3, 4 of the left foot it is painful and symptomatic with pressure. The previous biopsy showed a hyperkeratotic lesion. Assessment: Plantarflexed metatarsal with porokeratotic lesion x1 Plan: Discussed surgical and conservative therapies and treatments. I did agree that conservative therapies have been exhausted with debridement of the lesion and orthotics. Discussed Dr. Burna Sis surgical plan versus my own  thought on performing a dorsiflexion osteotomy of either the third or fourth metatarsals after looking at his x-rays with the patient. He will consider his surgical options and will be seen back when necessary.

## 2013-04-06 ENCOUNTER — Encounter: Payer: Self-pay | Admitting: Podiatrist

## 2013-04-06 ENCOUNTER — Ambulatory Visit (INDEPENDENT_AMBULATORY_CARE_PROVIDER_SITE_OTHER): Payer: Medicare Other | Admitting: Podiatrist

## 2013-04-06 VITALS — BP 152/80 | HR 93 | Resp 16 | Ht 70.0 in | Wt <= 1120 oz

## 2013-04-06 DIAGNOSIS — Q828 Other specified congenital malformations of skin: Secondary | ICD-10-CM

## 2013-04-06 DIAGNOSIS — M216X9 Other acquired deformities of unspecified foot: Secondary | ICD-10-CM

## 2013-04-06 DIAGNOSIS — M216X2 Other acquired deformities of left foot: Secondary | ICD-10-CM

## 2013-04-06 NOTE — Progress Notes (Signed)
Subjective: Patient presents today for followup of calloused area submetatarsal 3, 4 of the left foot. He states it's been real sore and he relates that the orthotics have helped some however he still gets the pain occurs after the callus gets large. We have spoken in the past about surgery to elevate the metatarsals and his also spoken to Dr. Sharol Given about the surgery as well.  Objective: Neurovascular status is intact and unchanged to the left foot. A large hyperkeratotic lesion is present submetatarsal 3, 4 of the left foot it is painful and symptomatic with pressure. The previous biopsy showed a hyperkeratotic lesion.   Assessment: Plantarflexed metatarsal with porokeratotic lesion x1   Plan: Discussed surgical and conservative therapies and treatments. Today we did a thorough debridement of the lesion with a #15 blade without complication. We did briefly again discussed the surgery and the patient's considering this as well. He'll be seen back as needed for followup and if any problems or concerns arise he will call.

## 2013-12-04 ENCOUNTER — Emergency Department: Payer: Self-pay | Admitting: Emergency Medicine

## 2013-12-04 LAB — BASIC METABOLIC PANEL
Anion Gap: 8 (ref 7–16)
BUN: 16 mg/dL (ref 7–18)
Calcium, Total: 9.1 mg/dL (ref 8.5–10.1)
Chloride: 103 mmol/L (ref 98–107)
Co2: 27 mmol/L (ref 21–32)
Creatinine: 1.18 mg/dL (ref 0.60–1.30)
EGFR (African American): >60
EGFR (Non-African Amer.): >60
Glucose: 214 mg/dL — ABNORMAL HIGH (ref 65–99)
Osmolality: 283 (ref 275–301)
Potassium: 3.7 mmol/L (ref 3.5–5.1)
Sodium: 138 mmol/L (ref 136–145)

## 2013-12-04 LAB — CBC
HCT: 47.1 % (ref 40.0–52.0)
HGB: 15.3 g/dL (ref 13.0–18.0)
MCH: 31.3 pg (ref 26.0–34.0)
MCHC: 32.5 g/dL (ref 32.0–36.0)
MCV: 96 fL (ref 80–100)
Platelet: 202 10*3/uL (ref 150–440)
RBC: 4.9 10*6/uL (ref 4.40–5.90)
RDW: 13.6 % (ref 11.5–14.5)
WBC: 12.4 10*3/uL — ABNORMAL HIGH (ref 3.8–10.6)

## 2013-12-05 LAB — PROTIME-INR
INR: 1.1
Prothrombin Time: 13.6 secs (ref 11.5–14.7)

## 2013-12-05 LAB — PRO B NATRIURETIC PEPTIDE: B-Type Natriuretic Peptide: 253 pg/mL — ABNORMAL HIGH (ref 0–125)

## 2013-12-05 LAB — TROPONIN I
Troponin-I: 0.06 ng/mL — ABNORMAL HIGH
Troponin-I: 0.08 ng/mL — ABNORMAL HIGH

## 2013-12-06 ENCOUNTER — Telehealth: Payer: Self-pay | Admitting: *Deleted

## 2013-12-06 ENCOUNTER — Encounter (INDEPENDENT_AMBULATORY_CARE_PROVIDER_SITE_OTHER): Payer: Self-pay

## 2013-12-06 ENCOUNTER — Encounter: Payer: Self-pay | Admitting: Cardiovascular Disease

## 2013-12-06 ENCOUNTER — Ambulatory Visit (INDEPENDENT_AMBULATORY_CARE_PROVIDER_SITE_OTHER): Payer: Medicare Other | Admitting: Cardiovascular Disease

## 2013-12-06 VITALS — BP 149/91 | HR 84 | Ht 70.0 in | Wt 257.2 lb

## 2013-12-06 DIAGNOSIS — R0789 Other chest pain: Secondary | ICD-10-CM

## 2013-12-06 DIAGNOSIS — I200 Unstable angina: Secondary | ICD-10-CM

## 2013-12-06 DIAGNOSIS — Z720 Tobacco use: Secondary | ICD-10-CM | POA: Insufficient documentation

## 2013-12-06 DIAGNOSIS — I10 Essential (primary) hypertension: Secondary | ICD-10-CM

## 2013-12-06 NOTE — Patient Instructions (Addendum)
[facility] Cardiac Cath Instructions   You are scheduled for a Cardiac Cath on:__9/17/15_______________________  Please arrive at _0630______am on the day of your procedure  You will need to pre-register prior to the day of your procedure.  Enter through the [employer] at [facility].  Registration is the first desk on your right.  Please take the procedure order we have given you in order to be registered appropriately  Do not eat/drink anything after midnight  Someone will need to drive you home  It is recommended someone be with you for the first 24 hours after your procedure  Wear clothes that are easy to get on/off and wear slip on shoes if possible   Medications bring a current list of all medications with you   __x_ Do not take these medications before your procedure: Hold Metformin today, tomorrow and two after your procedure. Hold Hydrochlorothiazide the day of your procedure.      Day of your procedure: Arrive at the [facility] entrance.  Free valet service is available.  After entering the [place] please check-in at the registration desk (1st desk on your right) to receive your armband. After receiving your armband someone will escort you to the cardiac cath/special procedures waiting area.  The usual length of stay after your procedure is about 2 to 3 hours.  This can vary.  If you have any questions, please call our office at [phone], or you may call the cardiac cath lab at [facility] directly at [phone]

## 2013-12-06 NOTE — Assessment & Plan Note (Signed)
The patient's symptoms are highly suggestive of unstable angina with mildly elevated troponin. He has multiple risk factors for coronary artery disease. I recommend proceeding with urgent cardiac catheterization. I discussed with him the alternative option of proceeding with stress testing. However, his pretest probability for underlying obstructive coronary artery disease is high and thus stress testing is unlikely to be helpful. He is to continue aspirin 81 mg once daily. I discussed the procedure in details as well as all potential risks associated with the procedure. I asked him to avoid any physical exertion until further instructions after cardiac catheterization which will be scheduled tomorrow.

## 2013-12-06 NOTE — Telephone Encounter (Signed)
Informed patient to arrive for cath 12/07/13 at 0630 am  Patient verbalized understanding   Cath orders faxed  Angie conformed receipt

## 2013-12-06 NOTE — Progress Notes (Signed)
Primary care physician: [name]  HPI  This is a pleasant 68 year old man who was referred from the emergency room at [facility] : [facility] for evaluation of chest pain. He has no previous cardiac history. He has known history of type 2 diabetes diagnosed in 2010, hypertension, tobacco use and obesity. He also has family history of coronary artery disease. 2 days ago he was mowing his yard and was stung by yellow jackets multiple times. He had a local skin reaction. Later that day, he started having substernal chest tightness located on the left side with radiation to the left shoulder. It was associated with shortness of breath, dizziness and sweating. This lasted for about 24 hours. He went to the emergency room at [facility]. His troponin was found to be mildly elevated at 0.08 with a BNP of 253. ECG showed no significant ST changes. Chest x-ray showed no acute cardiopulmonary process. He was discharged home with a close followup. He has not done much physical activities since then.  Allergies  Allergen Reactions  . Codeine Sulfate     Other reaction(s): Unknown Onset 02/19/1999.  Marland Kitchen Loratadine     Other reaction(s): Unknown Onset 02/19/1999.     Current Outpatient Prescriptions on File Prior to Visit  Medication Sig Dispense Refill  . amLODipine (NORVASC) 5 MG tablet Take 5 mg by mouth daily.      . hydrochlorothiazide (HYDRODIURIL) 25 MG tablet Take 25 mg by mouth daily.      Marland Kitchen lisinopril (PRINIVIL,ZESTRIL) 20 MG tablet Take 20 mg by mouth 2 (two) times daily.      . metFORMIN (GLUCOPHAGE) 500 MG tablet Take 500 mg by mouth 2 (two) times daily with a meal.      . Multiple Vitamin (MULTIVITAMIN WITH MINERALS) TABS Take 1 tablet by mouth daily.      Marland Kitchen omeprazole (PRILOSEC) 20 MG capsule Take 20 mg by mouth daily.      . Probiotic Product (PROBIOTIC DAILY PO) Take 1 tablet by mouth 2 (two) times daily.       No current facility-administered medications on file prior to visit.     Past Medical History    Diagnosis Date  . Diabetes mellitus without complication   . Sinus problem   . Blind     one eye left  . GERD (gastroesophageal reflux disease)   . COPD (chronic obstructive pulmonary disease)   . Hypertension   . Tobacco use      Past Surgical History  Procedure Laterality Date  . Foot surgery Bilateral   . Anterior cervical decomp/discectomy fusion    . Gallbladder surgery    . Femur fracture surgery    . Eye surgery    . Nasal reconstruction       Family History  Problem Relation Age of Onset  . Family history unknown: Yes     History   Social History  . Marital Status: Married    Spouse Name: N/A    Number of Children: N/A  . Years of Education: N/A   Occupational History  . Not on file.   Social History Main Topics  . Smoking status: Current Every Day Smoker -- 1.00 packs/day    Types: Cigarettes  . Smokeless tobacco: Never Used  . Alcohol Use: No  . Drug Use: No  . Sexual Activity: Not on file   Other Topics Concern  . Not on file   Social History Narrative  . No narrative on file     ROS A  10 point review of system was performed. It is negative other than that mentioned in the history of present illness.   PHYSICAL EXAM   BP 149/91  Pulse 84  Ht 5\' 10"  (1.778 m)  Wt 257 lb 4 oz (116.688 kg)  BMI 36.91 kg/m2 Constitutional: He is oriented to person, place, and time. He appears well-developed and well-nourished. No distress.  HENT: No nasal discharge.  Head: Normocephalic and atraumatic.  Eyes: Pupils are equal and round.  No discharge. Neck: Normal range of motion. Neck supple. No JVD present. No thyromegaly present.  Cardiovascular: Normal rate, regular rhythm, normal heart sounds. Exam reveals no gallop and no friction rub. No murmur heard.  Pulmonary/Chest: Effort normal and breath sounds normal. No stridor. No respiratory distress. He has no wheezes. He has no rales. He exhibits no tenderness.  Abdominal: Soft. Bowel sounds are  normal. He exhibits no distension. There is no tenderness. There is no rebound and no guarding.  Musculoskeletal: Normal range of motion. He exhibits no edema and no tenderness.  Neurological: He is alert and oriented to person, place, and time. Coordination normal.  Skin: Skin is warm and dry. No rash noted. He is not diaphoretic. No erythema. No pallor.  Psychiatric: He has a normal mood and affect. His behavior is normal. Judgment and thought content normal.       EKG: Sinus  Rhythm  -First degree A-V block  PRi = 232 -No significant ST or T wave changes Isolated Q wave in a 3 likely physiologic  ABNORMAL    ASSESSMENT AND PLAN

## 2013-12-06 NOTE — Assessment & Plan Note (Signed)
Blood pressure is mildly elevated. I will consider adding a beta blocker based on results of cardiac catheterization. Hold hydrochlorothiazide tomorrow.

## 2013-12-07 ENCOUNTER — Ambulatory Visit: Payer: Self-pay | Admitting: Cardiovascular Disease

## 2013-12-07 ENCOUNTER — Telehealth: Payer: Self-pay | Admitting: *Deleted

## 2013-12-07 DIAGNOSIS — I2510 Atherosclerotic heart disease of native coronary artery without angina pectoris: Secondary | ICD-10-CM

## 2013-12-07 HISTORY — PX: CARDIAC CATHETERIZATION: SHX172

## 2013-12-07 MED ORDER — CLOPIDOGREL BISULFATE 75 MG PO TABS: 75.0000 mg | ORAL_TABLET | Freq: Every day | ORAL | Status: DC

## 2013-12-07 MED ORDER — PANTOPRAZOLE SODIUM 40 MG PO TBEC: 40.0000 mg | DELAYED_RELEASE_TABLET | Freq: Every day | ORAL | Status: DC

## 2013-12-07 MED ORDER — ATORVASTATIN CALCIUM 40 MG PO TABS: 40.0000 mg | ORAL_TABLET | Freq: Every day | ORAL | Status: DC

## 2013-12-07 NOTE — Telephone Encounter (Signed)
Per telephone call from Dr. Fletcher Anon  Patient needs to be started on Plavix 75 mg once daily  Atorvastatin 40 mg once daily

## 2013-12-07 NOTE — Telephone Encounter (Signed)
Patient returned call and verbalized understanding.

## 2013-12-07 NOTE — Telephone Encounter (Signed)
Per verbal from Dr. Fletcher Anon  Change Omeprazole to Protonix 40 mg once daily   LVM to inform patient

## 2013-12-08 ENCOUNTER — Encounter: Payer: Self-pay | Admitting: Cardiovascular Disease

## 2013-12-13 ENCOUNTER — Encounter: Payer: Self-pay | Admitting: *Deleted

## 2013-12-14 ENCOUNTER — Ambulatory Visit (INDEPENDENT_AMBULATORY_CARE_PROVIDER_SITE_OTHER): Payer: Medicare Other | Admitting: Cardiovascular Disease

## 2013-12-14 ENCOUNTER — Encounter: Payer: Self-pay | Admitting: Cardiovascular Disease

## 2013-12-14 VITALS — BP 140/84 | HR 70 | Ht 70.0 in | Wt 255.5 lb

## 2013-12-14 DIAGNOSIS — F17200 Nicotine dependence, unspecified, uncomplicated: Secondary | ICD-10-CM

## 2013-12-14 DIAGNOSIS — Z9889 Other specified postprocedural states: Secondary | ICD-10-CM

## 2013-12-14 DIAGNOSIS — Z720 Tobacco use: Secondary | ICD-10-CM

## 2013-12-14 DIAGNOSIS — E785 Hyperlipidemia, unspecified: Secondary | ICD-10-CM | POA: Insufficient documentation

## 2013-12-14 DIAGNOSIS — I10 Essential (primary) hypertension: Secondary | ICD-10-CM

## 2013-12-14 DIAGNOSIS — I2510 Atherosclerotic heart disease of native coronary artery without angina pectoris: Secondary | ICD-10-CM | POA: Insufficient documentation

## 2013-12-14 NOTE — Assessment & Plan Note (Signed)
Blood pressure is reasonably controlled on current medications. 

## 2013-12-14 NOTE — Progress Notes (Signed)
Primary care physician: [name]  HPI  This is a pleasant 68 year old man who is here today for a followup visit regarding coronary artery disease.  He has known history of type 2 diabetes diagnosed in 2010, hypertension, tobacco use and obesity. He also has family history of coronary artery disease. He was seen recently at the emergency room for chest pain with borderline elevated troponin.  ECG showed no significant ST changes. Chest x-ray showed no acute cardiopulmonary process. He was discharged home with a close followup.  Due to continued symptoms, I proceeded with cardiac catheterization which showed  an occluded mid RCA which was medium in size and codominant. Normal ejection fraction. I started him on Plavix and atorvastatin. He has been doing reasonably well but has not resumed his activities.   Allergies  Allergen Reactions  . Codeine Sulfate     Other reaction(s): Unknown Onset 02/19/1999.  Marland Kitchen Loratadine     Other reaction(s): Unknown Onset 02/19/1999.     Current Outpatient Prescriptions on File Prior to Visit  Medication Sig Dispense Refill  . amLODipine (NORVASC) 5 MG tablet Take 5 mg by mouth daily.      Marland Kitchen atorvastatin (LIPITOR) 40 MG tablet Take 1 tablet (40 mg total) by mouth daily.  30 tablet  6  . clopidogrel (PLAVIX) 75 MG tablet Take 1 tablet (75 mg total) by mouth daily.  30 tablet  6  . hydrochlorothiazide (HYDRODIURIL) 25 MG tablet Take 25 mg by mouth daily.      Marland Kitchen lisinopril (PRINIVIL,ZESTRIL) 20 MG tablet Take 20 mg by mouth 2 (two) times daily.      . metFORMIN (GLUCOPHAGE) 500 MG tablet Take 500 mg by mouth 2 (two) times daily with a meal.      . Multiple Vitamin (MULTIVITAMIN WITH MINERALS) TABS Take 1 tablet by mouth daily.      . pantoprazole (PROTONIX) 40 MG tablet Take 1 tablet (40 mg total) by mouth daily.  30 tablet  11  . Probiotic Product (PROBIOTIC DAILY PO) Take 1 tablet by mouth 2 (two) times daily.       No current facility-administered  medications on file prior to visit.     Past Medical History  Diagnosis Date  . Diabetes mellitus without complication   . Sinus problem   . Blind     one eye left  . GERD (gastroesophageal reflux disease)   . COPD (chronic obstructive pulmonary disease)   . Hypertension   . Tobacco use      Past Surgical History  Procedure Laterality Date  . Foot surgery Bilateral   . Anterior cervical decomp/discectomy fusion    . Gallbladder surgery    . Femur fracture surgery    . Eye surgery    . Nasal reconstruction    . Cardiac catheterization  12/07/2013    [facility]     Family History  Problem Relation Age of Onset  . Family history unknown: Yes     History   Social History  . Marital Status: Married    Spouse Name: N/A    Number of Children: N/A  . Years of Education: N/A   Occupational History  . Not on file.   Social History Main Topics  . Smoking status: Current Every Day Smoker -- 1.00 packs/day    Types: Cigarettes  . Smokeless tobacco: Never Used  . Alcohol Use: No  . Drug Use: No  . Sexual Activity: Not on file   Other Topics Concern  .  Not on file   Social History Narrative  . No narrative on file     ROS A 10 point review of system was performed. It is negative other than that mentioned in the history of present illness.   PHYSICAL EXAM   BP 140/84  Pulse 70  Ht 5\' 10"  (1.778 m)  Wt 255 lb 8 oz (115.894 kg)  BMI 36.66 kg/m2 Constitutional: He is oriented to person, place, and time. He appears well-developed and well-nourished. No distress.  HENT: No nasal discharge.  Head: Normocephalic and atraumatic.  Eyes: Pupils are equal and round.  No discharge. Neck: Normal range of motion. Neck supple. No JVD present. No thyromegaly present.  Cardiovascular: Normal rate, regular rhythm, normal heart sounds. Exam reveals no gallop and no friction rub. No murmur heard.  Pulmonary/Chest: Effort normal and breath sounds normal. No stridor. No  respiratory distress. He has no wheezes. He has no rales. He exhibits no tenderness.  Abdominal: Soft. Bowel sounds are normal. He exhibits no distension. There is no tenderness. There is no rebound and no guarding.  Musculoskeletal: Normal range of motion. He exhibits no edema and no tenderness.  Neurological: He is alert and oriented to person, place, and time. Coordination normal.  Skin: Skin is warm and dry. No rash noted. He is not diaphoretic. No erythema. No pallor.  Psychiatric: He has a normal mood and affect. His behavior is normal. Judgment and thought content normal.  Right radial pulse is normal with no hematoma     EKG: Normal sinus rhythm   ASSESSMENT AND PLAN

## 2013-12-14 NOTE — Assessment & Plan Note (Signed)
I started him on atorvastatin recently. Check fasting lipid and liver profile in one month.

## 2013-12-14 NOTE — Assessment & Plan Note (Signed)
He was found recently to have an occluded right coronary artery with faint left-to-right collaterals. Clinically, he seems to be stable and at the present time I recommend continuing medical therapy. 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 Thus, I recommend cardiac rehabilitation. I had a prolonged discussion with him about the importance of lifestyle changes. A beta blocker can be considered if he develops angina.

## 2013-12-14 NOTE — Patient Instructions (Signed)
Your physician recommends that you return for lab work in:  1 month for fasting labs   You have been referred to cardiac rehab. They will call you with an appointment.   Your physician recommends that you schedule a follow-up appointment in:  3 months with Dr. Fletcher Anon

## 2013-12-14 NOTE — Assessment & Plan Note (Signed)
I had a prolonged discussion with them about the importance of smoking cessation.

## 2013-12-21 ENCOUNTER — Telehealth: Payer: Self-pay | Admitting: *Deleted

## 2013-12-21 NOTE — Telephone Encounter (Signed)
LVM to confirm cardiac rehab referral has been sent

## 2013-12-27 NOTE — Telephone Encounter (Signed)
Faxed cardiac rehab orders and patient info

## 2014-01-08 ENCOUNTER — Encounter: Payer: Self-pay | Admitting: Cardiovascular Disease

## 2014-01-12 ENCOUNTER — Emergency Department: Payer: Self-pay | Admitting: Emergency Medicine

## 2014-01-12 LAB — PROTIME-INR
INR: 1
Prothrombin Time: 13.3 secs (ref 11.5–14.7)

## 2014-01-12 LAB — COMPREHENSIVE METABOLIC PANEL
Albumin: 3.6 g/dL (ref 3.4–5.0)
Alkaline Phosphatase: 85 U/L
Anion Gap: 8 (ref 7–16)
BUN: 16 mg/dL (ref 7–18)
Bilirubin,Total: 0.4 mg/dL (ref 0.2–1.0)
Calcium, Total: 9 mg/dL (ref 8.5–10.1)
Chloride: 101 mmol/L (ref 98–107)
Co2: 28 mmol/L (ref 21–32)
Creatinine: 0.78 mg/dL (ref 0.60–1.30)
EGFR (African American): >60
EGFR (Non-African Amer.): >60
Glucose: 158 mg/dL — ABNORMAL HIGH (ref 65–99)
Osmolality: 278 (ref 275–301)
Potassium: 4.2 mmol/L (ref 3.5–5.1)
SGOT(AST): 21 U/L (ref 15–37)
SGPT (ALT): 28 U/L
Sodium: 137 mmol/L (ref 136–145)
Total Protein: 6.7 g/dL (ref 6.4–8.2)

## 2014-01-12 LAB — CK TOTAL AND CKMB (NOT AT ARMC)
CK, Total: 161 U/L
CK-MB: 2.3 ng/mL (ref 0.5–3.6)

## 2014-01-12 LAB — CBC
HCT: 44.9 % (ref 40.0–52.0)
HGB: 14.6 g/dL (ref 13.0–18.0)
MCH: 31.6 pg (ref 26.0–34.0)
MCHC: 32.5 g/dL (ref 32.0–36.0)
MCV: 97 fL (ref 80–100)
Platelet: 224 10*3/uL (ref 150–440)
RBC: 4.62 10*6/uL (ref 4.40–5.90)
RDW: 14 % (ref 11.5–14.5)
WBC: 9.7 10*3/uL (ref 3.8–10.6)

## 2014-01-12 LAB — PRO B NATRIURETIC PEPTIDE: B-Type Natriuretic Peptide: 319 pg/mL — ABNORMAL HIGH (ref 0–125)

## 2014-01-12 LAB — TROPONIN I: Troponin-I: 0.05 ng/mL

## 2014-01-12 LAB — APTT: Activated PTT: 33.9 secs (ref 23.6–35.9)

## 2014-01-15 ENCOUNTER — Encounter: Payer: Self-pay | Admitting: Cardiovascular Disease

## 2014-01-15 ENCOUNTER — Ambulatory Visit (INDEPENDENT_AMBULATORY_CARE_PROVIDER_SITE_OTHER): Payer: Medicare Other | Admitting: Cardiovascular Disease

## 2014-01-15 ENCOUNTER — Telehealth: Payer: Self-pay | Admitting: *Deleted

## 2014-01-15 VITALS — BP 124/78 | HR 61 | Ht 70.0 in | Wt 255.5 lb

## 2014-01-15 DIAGNOSIS — E785 Hyperlipidemia, unspecified: Secondary | ICD-10-CM

## 2014-01-15 DIAGNOSIS — I2510 Atherosclerotic heart disease of native coronary artery without angina pectoris: Secondary | ICD-10-CM

## 2014-01-15 DIAGNOSIS — I10 Essential (primary) hypertension: Secondary | ICD-10-CM

## 2014-01-15 DIAGNOSIS — R0602 Shortness of breath: Secondary | ICD-10-CM

## 2014-01-15 DIAGNOSIS — I471 Supraventricular tachycardia: Secondary | ICD-10-CM

## 2014-01-15 NOTE — Patient Instructions (Signed)
Continue same medications.   Keep your previous follow ups for labs and office visit.   Your next appointment will be scheduled in our new office located at :  [facility]  [address] [place]  [place], [place] [address]

## 2014-01-15 NOTE — Assessment & Plan Note (Signed)
He has no symptoms of angina. Continue medical therapy. Continue cardiac rehabilitation with no restrictions.

## 2014-01-15 NOTE — Assessment & Plan Note (Signed)
Continue treatment with atorvastatin. He is coming for a fasting lipid and liver profile tomorrow.

## 2014-01-15 NOTE — Telephone Encounter (Signed)
Patient scheduled to see Dr. Fletcher Anon today Patient aware

## 2014-01-15 NOTE — Assessment & Plan Note (Signed)
Blood pressure is well controlled on current medications. 

## 2014-01-15 NOTE — Assessment & Plan Note (Signed)
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 Continue metoprolol 25 mg twice daily which was started recently.

## 2014-01-15 NOTE — Progress Notes (Signed)
Primary care physician: [name]  HPI  This is a pleasant 68 year old man who is here today for a followup visit regarding coronary artery disease and recent supraventricular tachycardia.  He has known history of type 2 diabetes diagnosed in 2010, hypertension, tobacco use and obesity. He also has family history of coronary artery disease. He was seen recently at the emergency room for chest pain with borderline elevated troponin.   He was discharged home with a close followup.  Due to continued symptoms, I proceeded with cardiac catheterization which showed  an occluded mid RCA which was medium in size and codominant. Normal ejection fraction. I started him on Plavix and atorvastatin.  He was referred to cardiac rehabilitation and doing his first session, he became suddenly tachycardic with a heart rate of around 150 beats per minute with mild hypotension. Exercise was stopped. He was not significantly symptomatic. He was sent to the emergency room but by the time he arrived his heart rate was back to normal. He was started on metoprolol 25 mg twice daily. He has been doing well since then and resume cardiac rehabilitation today without any significant issues.  Allergies  Allergen Reactions  . Codeine Sulfate     Other reaction(s): Unknown Onset 02/19/1999.  Marland Kitchen Loratadine     Other reaction(s): Unknown Onset 02/19/1999.     Current Outpatient Prescriptions on File Prior to Visit  Medication Sig Dispense Refill  . amLODipine (NORVASC) 5 MG tablet Take 5 mg by mouth daily.      Marland Kitchen atorvastatin (LIPITOR) 40 MG tablet Take 1 tablet (40 mg total) by mouth daily.  30 tablet  6  . clopidogrel (PLAVIX) 75 MG tablet Take 1 tablet (75 mg total) by mouth daily.  30 tablet  6  . hydrochlorothiazide (HYDRODIURIL) 25 MG tablet Take 25 mg by mouth daily.      Marland Kitchen lisinopril (PRINIVIL,ZESTRIL) 20 MG tablet Take 20 mg by mouth 2 (two) times daily.      . metFORMIN (GLUCOPHAGE) 500 MG tablet Take 500 mg by  mouth 2 (two) times daily with a meal.      . Multiple Vitamin (MULTIVITAMIN WITH MINERALS) TABS Take 1 tablet by mouth daily.      . naphazoline (NAPHCON) 0.1 % ophthalmic solution Place 1 drop into both eyes daily.      . pantoprazole (PROTONIX) 40 MG tablet Take 1 tablet (40 mg total) by mouth daily.  30 tablet  11  . Probiotic Product (PROBIOTIC DAILY PO) Take 1 tablet by mouth 2 (two) times daily.       No current facility-administered medications on file prior to visit.     Past Medical History  Diagnosis Date  . Diabetes mellitus without complication   . Sinus problem   . Blind     one eye left  . GERD (gastroesophageal reflux disease)   . COPD (chronic obstructive pulmonary disease)   . Hypertension   . Tobacco use   . Coronary artery disease     Cardiac catheterization in September of 2015 showed an occluded mid RCA which was medium in size and codominant. Normal ejection fraction.     Past Surgical History  Procedure Laterality Date  . Foot surgery Bilateral   . Anterior cervical decomp/discectomy fusion    . Gallbladder surgery    . Femur fracture surgery    . Eye surgery    . Nasal reconstruction    . Cardiac catheterization  12/07/2013    [facility]  History reviewed. No pertinent family history.   History   Social History  . Marital Status: Married    Spouse Name: N/A    Number of Children: N/A  . Years of Education: N/A   Occupational History  . Not on file.   Social History Main Topics  . Smoking status: Current Every Day Smoker -- 1.00 packs/day    Types: Cigarettes  . Smokeless tobacco: Never Used  . Alcohol Use: No  . Drug Use: No  . Sexual Activity: Not on file   Other Topics Concern  . Not on file   Social History Narrative  . No narrative on file     ROS A 10 point review of system was performed. It is negative other than that mentioned in the history of present illness.   PHYSICAL EXAM   BP 124/78  Pulse 61  Ht 5\' 10"   (1.778 m)  Wt 255 lb 8 oz (115.894 kg)  BMI 36.66 kg/m2 Constitutional: He is oriented to person, place, and time. He appears well-developed and well-nourished. No distress.  HENT: No nasal discharge.  Head: Normocephalic and atraumatic.  Eyes: Pupils are equal and round.  No discharge. Neck: Normal range of motion. Neck supple. No JVD present. No thyromegaly present.  Cardiovascular: Normal rate, regular rhythm, normal heart sounds. Exam reveals no gallop and no friction rub. No murmur heard.  Pulmonary/Chest: Effort normal and breath sounds normal. No stridor. No respiratory distress. He has no wheezes. He has no rales. He exhibits no tenderness.  Abdominal: Soft. Bowel sounds are normal. He exhibits no distension. There is no tenderness. There is no rebound and no guarding.  Musculoskeletal: Normal range of motion. He exhibits no edema and no tenderness.  Neurological: He is alert and oriented to person, place, and time. Coordination normal.  Skin: Skin is warm and dry. No rash noted. He is not diaphoretic. No erythema. No pallor.  Psychiatric: He has a normal mood and affect. His behavior is normal. Judgment and thought content normal.  Right radial pulse is normal with no hematoma     EKG: Sinus  Rhythm  -First degree A-V block  -Frequent PACs -atrial bigeminy  PRi = 256 BORDERLINE RHYTHM   ASSESSMENT AND PLAN

## 2014-01-15 NOTE — Telephone Encounter (Signed)
LVM to inform him of fu appt with Dr. Fletcher Anon for ed follow up for svt

## 2014-01-16 ENCOUNTER — Other Ambulatory Visit (INDEPENDENT_AMBULATORY_CARE_PROVIDER_SITE_OTHER): Payer: Medicare Other

## 2014-01-16 DIAGNOSIS — Z9889 Other specified postprocedural states: Secondary | ICD-10-CM

## 2014-01-16 DIAGNOSIS — E785 Hyperlipidemia, unspecified: Secondary | ICD-10-CM

## 2014-01-17 LAB — LIPID PANEL
Chol/HDL Ratio: 2.9 ratio units (ref 0.0–5.0)
Cholesterol, Total: 88 mg/dL — ABNORMAL LOW (ref 100–199)
HDL: 30 mg/dL — ABNORMAL LOW (ref >39)
LDL Calculated: 41 mg/dL (ref 0–99)
Triglycerides: 85 mg/dL (ref 0–149)
VLDL Cholesterol Cal: 17 mg/dL (ref 5–40)

## 2014-01-17 LAB — HEPATIC FUNCTION PANEL
ALT: 22 IU/L (ref 0–44)
AST: 16 IU/L (ref 0–40)
Albumin: 4.5 g/dL (ref 3.6–4.8)
Alkaline Phosphatase: 87 IU/L (ref 39–117)
Bilirubin, Direct: 0.14 mg/dL (ref 0.00–0.40)
Total Bilirubin: 0.5 mg/dL (ref 0.0–1.2)
Total Protein: 6.6 g/dL (ref 6.0–8.5)

## 2014-01-18 ENCOUNTER — Ambulatory Visit: Payer: Medicare Other | Admitting: Cardiovascular Disease

## 2014-01-21 ENCOUNTER — Encounter: Payer: Self-pay | Admitting: Cardiovascular Disease

## 2014-02-20 ENCOUNTER — Encounter: Payer: Self-pay | Admitting: Cardiovascular Disease

## 2014-03-13 ENCOUNTER — Other Ambulatory Visit: Payer: Self-pay | Admitting: *Deleted

## 2014-03-13 ENCOUNTER — Ambulatory Visit
Admission: RE | Admit: 2014-03-13 | Discharge: 2014-03-13 | Disposition: A | Discharge status: Home / Self Care | Payer: Medicare Other | Source: Ambulatory Visit | Attending: Neurological Surgery | Admitting: Neurological Surgery

## 2014-03-13 ENCOUNTER — Other Ambulatory Visit: Payer: Self-pay | Admitting: Neurological Surgery

## 2014-03-13 ENCOUNTER — Telehealth: Payer: Self-pay | Admitting: Cardiovascular Disease

## 2014-03-13 DIAGNOSIS — Z139 Encounter for screening, unspecified: Secondary | ICD-10-CM

## 2014-03-13 DIAGNOSIS — M5412 Radiculopathy, cervical region: Secondary | ICD-10-CM

## 2014-03-13 MED ORDER — METOPROLOL TARTRATE 25 MG PO TABS: 25.0000 mg | ORAL_TABLET | Freq: Two times a day (BID) | ORAL | Status: DC

## 2014-03-13 NOTE — Telephone Encounter (Signed)
Rx sent to local pharmacy for metoprolol 25 mg #60 R#3.

## 2014-03-13 NOTE — Telephone Encounter (Signed)
Pt needs refill on Metoprolol Tartrate 25 mg sent to [facility] on [address] going out of town Sunday morning. Please call patient if we have any questions. He is about out.

## 2014-03-15 ENCOUNTER — Other Ambulatory Visit: Payer: Medicare Other

## 2014-03-18 ENCOUNTER — Ambulatory Visit
Admission: RE | Admit: 2014-03-18 | Discharge: 2014-03-18 | Disposition: A | Discharge status: Home / Self Care | Payer: Medicare Other | Source: Ambulatory Visit | Attending: Neurological Surgery | Admitting: Neurological Surgery

## 2014-03-18 DIAGNOSIS — M5412 Radiculopathy, cervical region: Secondary | ICD-10-CM

## 2014-03-19 ENCOUNTER — Ambulatory Visit: Payer: Medicare Other | Admitting: Cardiovascular Disease

## 2014-03-23 ENCOUNTER — Encounter: Payer: Self-pay | Admitting: Cardiovascular Disease

## 2014-03-29 DIAGNOSIS — M4722 Other spondylosis with radiculopathy, cervical region: Secondary | ICD-10-CM | POA: Insufficient documentation

## 2014-04-02 ENCOUNTER — Ambulatory Visit (INDEPENDENT_AMBULATORY_CARE_PROVIDER_SITE_OTHER): Payer: Medicare Other | Admitting: Cardiovascular Disease

## 2014-04-02 ENCOUNTER — Encounter: Payer: Self-pay | Admitting: Cardiovascular Disease

## 2014-04-02 VITALS — BP 130/78 | HR 57 | Ht 70.0 in | Wt 256.2 lb

## 2014-04-02 DIAGNOSIS — I2510 Atherosclerotic heart disease of native coronary artery without angina pectoris: Secondary | ICD-10-CM

## 2014-04-02 DIAGNOSIS — E785 Hyperlipidemia, unspecified: Secondary | ICD-10-CM

## 2014-04-02 DIAGNOSIS — I10 Essential (primary) hypertension: Secondary | ICD-10-CM

## 2014-04-02 DIAGNOSIS — I471 Supraventricular tachycardia: Secondary | ICD-10-CM

## 2014-04-02 NOTE — Assessment & Plan Note (Signed)
He has not had any arrhythmia since he was started on metoprolol.

## 2014-04-02 NOTE — Assessment & Plan Note (Signed)
Blood pressure is reasonably controlled on current medications. 

## 2014-04-02 NOTE — Progress Notes (Signed)
Primary care physician: [name]  HPI  This is a pleasant 69 year old man who is here today for a followup visit regarding coronary artery disease and supraventricular tachycardia.  He has known history of type 2 diabetes diagnosed in 2010, hypertension, tobacco use and obesity. He also has family history of coronary artery disease. He was seen for chest pain and shortness of breath. He underwent cardiac catheterization in September 2015 which showed  an occluded mid RCA which was medium in size and codominant. Normal ejection fraction. I started him on Plavix and atorvastatin.  He was referred to cardiac rehabilitation and doing his first session, he became suddenly tachycardic with a heart rate of around 150 beats per minute with mild hypotension. Exercise was stopped.  He was sent to the emergency room but by the time he arrived his heart rate was back to normal. He was started on metoprolol 25 mg twice daily. He has been doing well since then and resumed cardiac rehabilitation without any  Issues. He quit smoking in December. He denies any chest pain or shortness of breath. He has significant back and neck pain . He was prescribed meloxicam but he is concerned about taking this medication.  Allergies  Allergen Reactions  . Codeine Sulfate     Other reaction(s): Unknown Onset 02/19/1999.  Marland Kitchen Loratadine     Other reaction(s): Unknown Onset 02/19/1999.     Current Outpatient Prescriptions on File Prior to Visit  Medication Sig Dispense Refill  . amLODipine (NORVASC) 5 MG tablet Take 5 mg by mouth daily.    Marland Kitchen atorvastatin (LIPITOR) 40 MG tablet Take 1 tablet (40 mg total) by mouth daily. 30 tablet 6  . clopidogrel (PLAVIX) 75 MG tablet Take 1 tablet (75 mg total) by mouth daily. 30 tablet 6  . hydrochlorothiazide (HYDRODIURIL) 25 MG tablet Take 25 mg by mouth daily.    Marland Kitchen lisinopril (PRINIVIL,ZESTRIL) 20 MG tablet Take 20 mg by mouth 2 (two) times daily.    . metFORMIN (GLUCOPHAGE) 500 MG  tablet Take 500 mg by mouth 2 (two) times daily with a meal.    . metoprolol tartrate (LOPRESSOR) 25 MG tablet Take 1 tablet (25 mg total) by mouth every 12 (twelve) hours. 60 tablet 3  . Multiple Vitamin (MULTIVITAMIN WITH MINERALS) TABS Take 1 tablet by mouth daily.    . naphazoline (NAPHCON) 0.1 % ophthalmic solution Place 1 drop into both eyes daily.    . pantoprazole (PROTONIX) 40 MG tablet Take 1 tablet (40 mg total) by mouth daily. 30 tablet 11  . Probiotic Product (PROBIOTIC DAILY PO) Take 1 tablet by mouth 2 (two) times daily.     No current facility-administered medications on file prior to visit.     Past Medical History  Diagnosis Date  . Diabetes mellitus without complication   . Sinus problem   . Blind     one eye left  . GERD (gastroesophageal reflux disease)   . COPD (chronic obstructive pulmonary disease)   . Hypertension   . Tobacco use   . Coronary artery disease     Cardiac catheterization in September of 2015 showed an occluded mid RCA which was medium in size and codominant. Normal ejection fraction.     Past Surgical History  Procedure Laterality Date  . Foot surgery Bilateral   . Anterior cervical decomp/discectomy fusion    . Gallbladder surgery    . Femur fracture surgery    . Eye surgery    . Nasal reconstruction    .  Cardiac catheterization  12/07/2013    [facility]     History reviewed. No pertinent family history.   History   Social History  . Marital Status: Married    Spouse Name: N/A    Number of Children: N/A  . Years of Education: N/A   Occupational History  . Not on file.   Social History Main Topics  . Smoking status: Former Smoker -- 1.00 packs/day    Types: Cigarettes    Quit date: 03/21/2014  . Smokeless tobacco: Never Used  . Alcohol Use: No  . Drug Use: No  . Sexual Activity: Not on file   Other Topics Concern  . Not on file   Social History Narrative     ROS A 10 point review of system was performed. It is  negative other than that mentioned in the history of present illness.   PHYSICAL EXAM   BP 130/78 mmHg  Pulse 57  Ht 5\' 10"  (1.778 m)  Wt 256 lb 4 oz (116.234 kg)  BMI 36.77 kg/m2 Constitutional: He is oriented to person, place, and time. He appears well-developed and well-nourished. No distress.  HENT: No nasal discharge.  Head: Normocephalic and atraumatic.  Eyes: Pupils are equal and round.  No discharge. Neck: Normal range of motion. Neck supple. No JVD present. No thyromegaly present.  Cardiovascular: Normal rate, regular rhythm, normal heart sounds. Exam reveals no gallop and no friction rub. No murmur heard.  Pulmonary/Chest: Effort normal and breath sounds normal. No stridor. No respiratory distress. He has no wheezes. He has no rales. He exhibits no tenderness.  Abdominal: Soft. Bowel sounds are normal. He exhibits no distension. There is no tenderness. There is no rebound and no guarding.  Musculoskeletal: Normal range of motion. He exhibits no edema and no tenderness.  Neurological: He is alert and oriented to person, place, and time. Coordination normal.  Skin: Skin is warm and dry. No rash noted. He is not diaphoretic. No erythema. No pallor.  Psychiatric: He has a normal mood and affect. His behavior is normal. Judgment and thought content normal.  Right radial pulse is normal with no hematoma     EKG: Normal sinus rhythm with PACs and first degree AV block ABNORMAL RHYTHM   ASSESSMENT AND PLAN

## 2014-04-02 NOTE — Assessment & Plan Note (Signed)
Lab Results  Component Value Date   HDL 30* 01/16/2014   LDLCALC 41 01/16/2014   TRIG 85 01/16/2014   CHOLHDL 2.9 01/16/2014   Continue treatment with atorvastatin. LDL is at target.

## 2014-04-02 NOTE — Patient Instructions (Signed)
Continue same medications.   Your physician wants you to follow-up in: 6 months.  You will receive a reminder letter in the mail two months in advance. If you don't receive a letter, please call our office to schedule the follow-up appointment.  

## 2014-04-02 NOTE — Assessment & Plan Note (Addendum)
He is doing very well and reports no symptoms suggestive of angina. Continue medical therapy. He is still attending cardiac rehabilitation. Meloxicam is a nonsteroidal anti-inflammatory drug and Increased risk of GI bleed. This was explained to the patient.  risks and benefits have to be factored in. He is currently not on aspirin and only on Plavix. So it might be reasonable to proceed with a small dose meloxicam as a trial.

## 2014-04-23 ENCOUNTER — Encounter: Payer: Self-pay | Admitting: Cardiovascular Disease

## 2014-05-30 ENCOUNTER — Ambulatory Visit: Payer: Self-pay | Admitting: Internal Medicine

## 2014-07-13 ENCOUNTER — Other Ambulatory Visit: Payer: Self-pay | Admitting: Cardiovascular Disease

## 2014-09-14 ENCOUNTER — Other Ambulatory Visit: Payer: Self-pay | Admitting: *Deleted

## 2014-09-14 MED ORDER — METOPROLOL TARTRATE 25 MG PO TABS: 25.0000 mg | ORAL_TABLET | Freq: Two times a day (BID) | ORAL | Status: DC

## 2014-11-09 ENCOUNTER — Ambulatory Visit (INDEPENDENT_AMBULATORY_CARE_PROVIDER_SITE_OTHER): Payer: Medicare Other | Admitting: Cardiovascular Disease

## 2014-11-09 ENCOUNTER — Encounter: Payer: Self-pay | Admitting: Cardiovascular Disease

## 2014-11-09 VITALS — BP 100/80 | HR 57 | Ht 70.0 in | Wt 258.5 lb

## 2014-11-09 DIAGNOSIS — I471 Supraventricular tachycardia: Secondary | ICD-10-CM | POA: Diagnosis not present

## 2014-11-09 DIAGNOSIS — E785 Hyperlipidemia, unspecified: Secondary | ICD-10-CM

## 2014-11-09 DIAGNOSIS — R0789 Other chest pain: Secondary | ICD-10-CM | POA: Diagnosis not present

## 2014-11-09 DIAGNOSIS — I10 Essential (primary) hypertension: Secondary | ICD-10-CM

## 2014-11-09 DIAGNOSIS — I2510 Atherosclerotic heart disease of native coronary artery without angina pectoris: Secondary | ICD-10-CM | POA: Diagnosis not present

## 2014-11-09 MED ORDER — METOPROLOL TARTRATE 25 MG PO TABS: 25.0000 mg | ORAL_TABLET | Freq: Two times a day (BID) | ORAL | Status: DC

## 2014-11-09 NOTE — Progress Notes (Signed)
Primary care physician: [name]  HPI  This is a pleasant 69 year old man who is here today for a followup visit regarding coronary artery disease and supraventricular tachycardia.  He has known history of type 2 diabetes diagnosed in 2010, hypertension, tobacco use and obesity. He also has family history of coronary artery disease. He was seen in 2015 for chest pain and shortness of breath. He underwent cardiac catheterization in September 2015 which showed  an occluded mid RCA which was medium in size and codominant. Normal ejection fraction. I started him on Plavix and atorvastatin. I recommended medical therapy. He quit smoking since then. During cardiac rehabilitation, he had an episode of tachycardia likely SVT. He was treated with metoprolol with no recurrent episodes. He has been doing reasonably well and denies any chest tightness. He has occasional twinges that last for a few seconds. Shortness of breath is stable. His diabetes is not optimally controlled and he admits to poor diet choices.  Allergies  Allergen Reactions  . Codeine Sulfate     Other reaction(s): Unknown Onset 02/19/1999.  Marland Kitchen Loratadine     Other reaction(s): Unknown Onset 02/19/1999.     Current Outpatient Prescriptions on File Prior to Visit  Medication Sig Dispense Refill  . amLODipine (NORVASC) 5 MG tablet Take 5 mg by mouth daily.    Marland Kitchen atorvastatin (LIPITOR) 40 MG tablet Take 1 tablet (40 mg total) by mouth daily. 30 tablet 6  . clopidogrel (PLAVIX) 75 MG tablet Take 1 tablet (75 mg total) by mouth daily. 30 tablet 6  . hydrochlorothiazide (HYDRODIURIL) 25 MG tablet Take 25 mg by mouth daily.    Marland Kitchen lisinopril (PRINIVIL,ZESTRIL) 20 MG tablet Take 20 mg by mouth 2 (two) times daily.    . meloxicam (MOBIC) 15 MG tablet Take 15 mg by mouth daily.     . methocarbamol (ROBAXIN) 500 MG tablet every 6 (six) hours as needed.  2  . Multiple Vitamin (MULTIVITAMIN WITH MINERALS) TABS Take 1 tablet by mouth daily.    .  naphazoline (NAPHCON) 0.1 % ophthalmic solution Place 1 drop into both eyes daily.    . pantoprazole (PROTONIX) 40 MG tablet Take 1 tablet (40 mg total) by mouth daily. 30 tablet 11  . Probiotic Product (PROBIOTIC DAILY PO) Take 1 tablet by mouth daily.      No current facility-administered medications on file prior to visit.     Past Medical History  Diagnosis Date  . Diabetes mellitus without complication   . Sinus problem   . Blind     one eye left  . GERD (gastroesophageal reflux disease)   . COPD (chronic obstructive pulmonary disease)   . Hypertension   . Tobacco use   . Coronary artery disease     Cardiac catheterization in September of 2015 showed an occluded mid RCA which was medium in size and codominant. Normal ejection fraction.     Past Surgical History  Procedure Laterality Date  . Foot surgery Bilateral   . Anterior cervical decomp/discectomy fusion    . Gallbladder surgery    . Femur fracture surgery    . Eye surgery    . Nasal reconstruction    . Cardiac catheterization  12/07/2013    [facility]     Family History  Problem Relation Age of Onset  . Family history unknown: Yes     Social History   Social History  . Marital Status: Married    Spouse Name: N/A  . Number of Children:  N/A  . Years of Education: N/A   Occupational History  . Not on file.   Social History Main Topics  . Smoking status: Former Smoker -- 1.00 packs/day    Types: Cigarettes    Quit date: 03/21/2014  . Smokeless tobacco: Never Used  . Alcohol Use: No  . Drug Use: No  . Sexual Activity: Not on file   Other Topics Concern  . Not on file   Social History Narrative     ROS A 10 point review of system was performed. It is negative other than that mentioned in the history of present illness.   PHYSICAL EXAM   BP 100/80 mmHg  Pulse 57  Ht '5\' 10"'$  (1.778 m)  Wt 258 lb 8 oz (117.255 kg)  BMI 37.09 kg/m2 Constitutional: He is oriented to person, place, and time. He  appears well-developed and well-nourished. No distress.  HENT: No nasal discharge.  Head: Normocephalic and atraumatic.  Eyes: Pupils are equal and round.  No discharge. Neck: Normal range of motion. Neck supple. No JVD present. No thyromegaly present.  Cardiovascular: Normal rate, regular rhythm, normal heart sounds. Exam reveals no gallop and no friction rub. No murmur heard.  Pulmonary/Chest: Effort normal and breath sounds normal. No stridor. No respiratory distress. He has no wheezes. He has no rales. He exhibits no tenderness.  Abdominal: Soft. Bowel sounds are normal. He exhibits no distension. There is no tenderness. There is no rebound and no guarding.  Musculoskeletal: Normal range of motion. He exhibits no edema and no tenderness.  Neurological: He is alert and oriented to person, place, and time. Coordination normal.  Skin: Skin is warm and dry. No rash noted. He is not diaphoretic. No erythema. No pallor.  Psychiatric: He has a normal mood and affect. His behavior is normal. Judgment and thought content normal.       EKG: Sinus  Bradycardia  -First degree A-V block  -With rate variation  PRi = 252  cv = 10. BORDERLINE RHYTHM   ASSESSMENT AND PLAN

## 2014-11-09 NOTE — Patient Instructions (Signed)
Medication Instructions: Continue same medications.   Labwork: None.   Procedures/Testing: None.   Follow-Up: 6 months with Dr. Arida.   Any Additional Special Instructions Will Be Listed Below (If Applicable).   

## 2014-11-09 NOTE — Assessment & Plan Note (Signed)
Blood pressure is on the low side but he denies any dizziness. He reports that his blood pressure is usually around 263-335 systolic at home.

## 2014-11-09 NOTE — Assessment & Plan Note (Signed)
No evidence of recurrent arrhythmia on current dose of metoprolol.

## 2014-11-09 NOTE — Assessment & Plan Note (Signed)
Lab Results  Component Value Date   CHOL 88* 01/16/2014   HDL 30* 01/16/2014   LDLCALC 41 01/16/2014   TRIG 85 01/16/2014   CHOLHDL 2.9 01/16/2014   LDL was at target of less than 70. Continue current dose of atorvastatin.

## 2014-11-09 NOTE — Assessment & Plan Note (Signed)
He is doing well with no convincing symptoms of angina. He is on Plavix without aspirin given that he is on NSAIDs for chronic arthritis. I discussed with him the importance of healthy lifestyle changes including diet, exercise and weight loss.

## 2015-02-16 ENCOUNTER — Emergency Department (HOSPITAL_COMMUNITY)
Admission: EM | Admit: 2015-02-16 | Discharge: 2015-02-16 | Disposition: A | Discharge status: Home / Self Care | Payer: Medicare Other | Attending: Emergency Medicine | Admitting: Emergency Medicine

## 2015-02-16 ENCOUNTER — Encounter (HOSPITAL_COMMUNITY): Payer: Self-pay | Admitting: Emergency Medicine

## 2015-02-16 DIAGNOSIS — I2510 Atherosclerotic heart disease of native coronary artery without angina pectoris: Secondary | ICD-10-CM | POA: Diagnosis not present

## 2015-02-16 DIAGNOSIS — I10 Essential (primary) hypertension: Secondary | ICD-10-CM | POA: Diagnosis not present

## 2015-02-16 DIAGNOSIS — M545 Low back pain: Secondary | ICD-10-CM | POA: Diagnosis not present

## 2015-02-16 DIAGNOSIS — M62830 Muscle spasm of back: Secondary | ICD-10-CM | POA: Insufficient documentation

## 2015-02-16 DIAGNOSIS — H5412 Blindness, left eye, low vision right eye: Secondary | ICD-10-CM | POA: Diagnosis not present

## 2015-02-16 DIAGNOSIS — J449 Chronic obstructive pulmonary disease, unspecified: Secondary | ICD-10-CM | POA: Diagnosis not present

## 2015-02-16 DIAGNOSIS — Z79899 Other long term (current) drug therapy: Secondary | ICD-10-CM | POA: Insufficient documentation

## 2015-02-16 DIAGNOSIS — Z87891 Personal history of nicotine dependence: Secondary | ICD-10-CM | POA: Insufficient documentation

## 2015-02-16 DIAGNOSIS — Z7902 Long term (current) use of antithrombotics/antiplatelets: Secondary | ICD-10-CM | POA: Insufficient documentation

## 2015-02-16 DIAGNOSIS — K219 Gastro-esophageal reflux disease without esophagitis: Secondary | ICD-10-CM | POA: Insufficient documentation

## 2015-02-16 DIAGNOSIS — E119 Type 2 diabetes mellitus without complications: Secondary | ICD-10-CM | POA: Insufficient documentation

## 2015-02-16 MED ORDER — DEXAMETHASONE SODIUM PHOSPHATE 10 MG/ML IJ SOLN
10.0000 mg | Freq: Once | INTRAMUSCULAR | Status: AC
  Administered 2015-02-16: 10 mg via INTRAMUSCULAR
  Filled 2015-02-16: qty 1

## 2015-02-16 MED ORDER — KETOROLAC TROMETHAMINE 60 MG/2ML IM SOLN
60.0000 mg | Freq: Once | INTRAMUSCULAR | Status: AC
  Administered 2015-02-16: 60 mg via INTRAMUSCULAR
  Filled 2015-02-16: qty 2

## 2015-02-16 MED ORDER — CYCLOBENZAPRINE HCL 10 MG PO TABS
5.0000 mg | ORAL_TABLET | Freq: Once | ORAL | Status: AC
  Administered 2015-02-16: 5 mg via ORAL
  Filled 2015-02-16: qty 1

## 2015-02-16 MED ORDER — BACLOFEN 10 MG PO TABS: 5.0000 mg | ORAL_TABLET | Freq: Three times a day (TID) | ORAL | Status: DC | PRN

## 2015-02-16 MED ORDER — IBUPROFEN 800 MG PO TABS: 800.0000 mg | ORAL_TABLET | Freq: Three times a day (TID) | ORAL | Status: DC

## 2015-02-16 NOTE — ED Notes (Signed)
Pt is in stable condition upon d/c and ambulates from ED. 

## 2015-02-16 NOTE — ED Notes (Addendum)
Pt c/o low back pain and right hip pain. Pt reports back pain ongoing for couple weeks increased today. Pt seen here for same in past given valium and pain medications with relief next day.

## 2015-02-16 NOTE — ED Provider Notes (Signed)
CSN: [id number]     Arrival date & time 02/16/15  1800 History  By signing my name below, I, Rayna Sexton, attest that this documentation has been prepared under the direction and in the presence of Gloriann Loan, PA-C. Electronically Signed: Rayna Sexton, ED Scribe. 02/16/2015. 6:29 PM.   Chief Complaint  Patient presents with  . Back Pain  . Hip Pain   The history is provided by the patient. No language interpreter was used.    HPI Comments: Howard Maxwell is a 69 y.o. male with a hx of DDD who presents to the Emergency Department complaining of moderate, diffuse, back pain with onset 2 weeks ago and worsening pain beginning yesterday. Pt notes that his pain began worsening and radiated to his right hip "around the socket" and radiates down his right leg noting that it worsens when ambulating or climbing stairs. He notes associated, mild, tingling to his LE's when the pain worsens further noting that he began ambulating with crutches since his pain worsened. Pt has been seen for the same symptoms (Dr. Ellene Route) in the past noting that he received rx's for vallum and pain medication which he says provided relief. He denies any hx of IVDA or CA. He denies any recent trauma or falls, fevers, groin pain, saddle anesthesia, abd pain, hematuria, dysuria, incontinence of his bowels or bladder and increased urinary frequency.   Past Medical History  Diagnosis Date  . Diabetes mellitus without complication ([place])   . Sinus problem   . Blind     one eye left  . GERD (gastroesophageal reflux disease)   . COPD (chronic obstructive pulmonary disease) ([place])   . Hypertension   . Tobacco use   . Coronary artery disease     Cardiac catheterization in September of 2015 showed an occluded mid RCA which was medium in size and codominant. Normal ejection fraction.   Past Surgical History  Procedure Laterality Date  . Foot surgery Bilateral   . Anterior cervical decomp/discectomy fusion    . Gallbladder  surgery    . Femur fracture surgery    . Eye surgery    . Nasal reconstruction    . Cardiac catheterization  12/07/2013    [facility]   Family History  Problem Relation Age of Onset  . Family history unknown: Yes   Social History  Substance Use Topics  . Smoking status: Former Smoker -- 1.00 packs/day    Types: Cigarettes    Quit date: 03/21/2014  . Smokeless tobacco: Never Used  . Alcohol Use: No    Review of Systems A complete 10 system review of systems was obtained and all systems are negative except as noted in the HPI and PMH.  Allergies  Codeine sulfate and Loratadine  Home Medications   Prior to Admission medications   Medication Sig Start Date End Date Taking? Authorizing Provider  amLODipine (NORVASC) 5 MG tablet Take 5 mg by mouth daily.    Historical Provider, MD  atorvastatin (LIPITOR) 40 MG tablet Take 1 tablet (40 mg total) by mouth daily. 12/07/13   Wellington Hampshire, MD  baclofen (LIORESAL) 10 MG tablet Take 0.5 tablets (5 mg total) by mouth 3 (three) times daily as needed for muscle spasms. 02/16/15   Gloriann Loan, PA-C  clopidogrel (PLAVIX) 75 MG tablet Take 1 tablet (75 mg total) by mouth daily. 12/07/13   Wellington Hampshire, MD  glipiZIDE (GLUCOTROL) 5 MG tablet Take 5 mg by mouth daily before breakfast.  Historical Provider, MD  hydrochlorothiazide (HYDRODIURIL) 25 MG tablet Take 25 mg by mouth daily.    Historical Provider, MD  ibuprofen (ADVIL,MOTRIN) 800 MG tablet Take 1 tablet (800 mg total) by mouth 3 (three) times daily. 02/16/15   Gloriann Loan, PA-C  lisinopril (PRINIVIL,ZESTRIL) 20 MG tablet Take 20 mg by mouth 2 (two) times daily.    Historical Provider, MD  meloxicam (MOBIC) 15 MG tablet Take 15 mg by mouth daily.  03/29/14   Historical Provider, MD  metFORMIN (GLUCOPHAGE) 850 MG tablet Take 850 mg by mouth 2 (two) times daily with a meal.    Historical Provider, MD  metoprolol tartrate (LOPRESSOR) 25 MG tablet Take 1 tablet (25 mg total) by mouth 2 (two) times  daily. 11/09/14   Wellington Hampshire, MD  Multiple Vitamin (MULTIVITAMIN WITH MINERALS) TABS Take 1 tablet by mouth daily.    Historical Provider, MD  naphazoline (NAPHCON) 0.1 % ophthalmic solution Place 1 drop into both eyes daily.    Historical Provider, MD  naproxen sodium (ANAPROX) 220 MG tablet Take 220-440 mg by mouth daily as needed.    Historical Provider, MD  Omega-3 Fatty Acids (FISH OIL) 1000 MG CAPS Take by mouth daily as needed.    Historical Provider, MD  pantoprazole (PROTONIX) 40 MG tablet Take 1 tablet (40 mg total) by mouth daily. 12/07/13   Wellington Hampshire, MD  Probiotic Product (PROBIOTIC DAILY PO) Take 1 tablet by mouth daily.     Historical Provider, MD   BP 135/74 mmHg  Pulse 82  Temp(Src) 98.4 F (36.9 C) (Oral)  Resp 18  Ht '5\' 10"'$  (1.778 m)  Wt 120.203 kg  BMI 38.02 kg/m2  SpO2 96% Physical Exam  Constitutional: He is oriented to person, place, and time. He appears well-developed and well-nourished.  HENT:  Head: Normocephalic and atraumatic.  Mouth/Throat: No oropharyngeal exudate.  Neck: Normal range of motion. No tracheal deviation present.  Cardiovascular: Intact distal pulses.   Pulses:      Dorsalis pedis pulses are 2+ on the right side, and 2+ on the left side.       Posterior tibial pulses are 2+ on the right side, and 2+ on the left side.  Pulmonary/Chest: Effort normal. No respiratory distress.  Abdominal: Soft. Bowel sounds are normal. He exhibits no distension. There is no tenderness.  Musculoskeletal: Normal range of motion.       Right hip: He exhibits tenderness and bony tenderness. He exhibits normal range of motion, normal strength, no swelling, no crepitus, no deformity and no laceration.       Lumbar back: He exhibits tenderness, pain and spasm. He exhibits normal range of motion, no bony tenderness, no swelling, no edema, no deformity, no laceration and normal pulse.       Back:       Legs: No c/t/l midline tenderness.  TTP along lumbar  paraspinal muscles with right spasm noted.   Neurological: He is alert and oriented to person, place, and time. He has normal strength. He displays normal reflexes. No cranial nerve deficit or sensory deficit. Coordination normal.  No saddle anesthesia.  Strength and sensation intact bilaterally throughout lower extremities.   Skin: Skin is warm and dry. He is not diaphoretic.  Psychiatric: He has a normal mood and affect. His behavior is normal.  Nursing note and vitals reviewed.  ED Course  Procedures  DIAGNOSTIC STUDIES: Oxygen Saturation is 96% on RA, normal by my interpretation.    COORDINATION OF  CARE: 6:27 PM Pt presents today due to worsening lower back pain. Discussed next steps with pt and he agreed to plan.   Labs Review Labs Reviewed - No data to display  Imaging Review No results found. I have personally reviewed and evaluated these images and lab results as part of my medical decision-making   EKG Interpretation None      MDM   Final diagnoses:  Right low back pain, with sciatica presence unspecified  Muscle spasm of back    Patient presents with low back pain that radiates to right hip.  VSS.  No injury/trauma.  No red flags. TTP along lumbar paraspinal muscles with right spasm noted. No neurological deficits.  Distal pulses intact.  No gait abnormalities.  No indication for imaging.  Suspect low back strain or other mechanical cause.  Doubt cauda equina.  Doubt infectious process.  Doubt AAA.  Will manage pain with toradol, decadron, and flexeril.  Upon reassessment, patient's pain much improved.  Discussed return precautions to the ED.  Follow up with PCP as well as orthopedics.  I personally performed the services described in this documentation, which was scribed in my presence. The recorded information has been reviewed and is accurate.    Gloriann Loan, PA-C 02/16/15 1940  Merrily Pew, MD 02/19/15 1228

## 2015-02-16 NOTE — ED Notes (Signed)
Call to triage x 1 no response

## 2015-02-16 NOTE — Discharge Instructions (Signed)

## 2015-05-13 ENCOUNTER — Encounter: Payer: Self-pay | Admitting: Cardiovascular Disease

## 2015-05-13 ENCOUNTER — Ambulatory Visit (INDEPENDENT_AMBULATORY_CARE_PROVIDER_SITE_OTHER): Payer: Medicare Other | Admitting: Cardiovascular Disease

## 2015-05-13 VITALS — BP 130/64 | HR 59 | Ht 70.0 in | Wt 257.2 lb

## 2015-05-13 DIAGNOSIS — M5441 Lumbago with sciatica, right side: Secondary | ICD-10-CM

## 2015-05-13 DIAGNOSIS — I471 Supraventricular tachycardia: Secondary | ICD-10-CM

## 2015-05-13 DIAGNOSIS — I2510 Atherosclerotic heart disease of native coronary artery without angina pectoris: Secondary | ICD-10-CM | POA: Diagnosis not present

## 2015-05-13 DIAGNOSIS — E785 Hyperlipidemia, unspecified: Secondary | ICD-10-CM

## 2015-05-13 MED ORDER — TRAMADOL HCL 50 MG PO TABS: 50.0000 mg | ORAL_TABLET | Freq: Four times a day (QID) | ORAL | Status: DC | PRN

## 2015-05-13 NOTE — Assessment & Plan Note (Signed)
No recurrent episodes on Metoprolol.

## 2015-05-13 NOTE — Assessment & Plan Note (Signed)
Does not seem to be vascular. Pulses are normal. He has a follow up with NS. Will likely need an MRI. I agreed to give him a one time refill on Tramadol until his appointment.

## 2015-05-13 NOTE — Progress Notes (Signed)
Primary care physician: [name]  HPI  This is a pleasant 70 year old man who is here today for a followup visit regarding coronary artery disease and supraventricular tachycardia.  He has known history of type 2 diabetes diagnosed in 2010, hypertension, tobacco use and obesity. He also has family history of coronary artery disease.  Cardiac catheterization in September 2015  showed  an occluded mid RCA which was medium in size and codominant. Normal ejection fraction. He was treated medically.  He quit smoking since then. During cardiac rehabilitation, he had an episode of tachycardia likely SVT. He was treated with metoprolol with no recurrent episodes.  He has been stable from a cardiac standpoint with no chest pain or SOB. His biggest issue is back pain radiating to right leg. He has an appointment with neurosurgery next month.   Allergies  Allergen Reactions  . Codeine Sulfate     Other reaction(s): Unknown Onset 02/19/1999.  Marland Kitchen Loratadine     Other reaction(s): Unknown Onset 02/19/1999.     Current Outpatient Prescriptions on File Prior to Visit  Medication Sig Dispense Refill  . atorvastatin (LIPITOR) 40 MG tablet Take 1 tablet (40 mg total) by mouth daily. 30 tablet 6  . baclofen (LIORESAL) 10 MG tablet Take 0.5 tablets (5 mg total) by mouth 3 (three) times daily as needed for muscle spasms. 5 each 0  . clopidogrel (PLAVIX) 75 MG tablet Take 1 tablet (75 mg total) by mouth daily. 30 tablet 6  . glipiZIDE (GLUCOTROL) 5 MG tablet Take 5 mg by mouth daily before breakfast.     . hydrochlorothiazide (HYDRODIURIL) 25 MG tablet Take 25 mg by mouth daily.    Marland Kitchen ibuprofen (ADVIL,MOTRIN) 800 MG tablet Take 1 tablet (800 mg total) by mouth 3 (three) times daily. (Patient taking differently: Take 800 mg by mouth as needed. ) 21 tablet 0  . lisinopril (PRINIVIL,ZESTRIL) 20 MG tablet Take 20 mg by mouth 2 (two) times daily.    . meloxicam (MOBIC) 15 MG tablet Take 15 mg by mouth daily.       . metFORMIN (GLUCOPHAGE) 850 MG tablet Take 850 mg by mouth 2 (two) times daily with a meal.    . metoprolol tartrate (LOPRESSOR) 25 MG tablet Take 1 tablet (25 mg total) by mouth 2 (two) times daily. 180 tablet 3  . Multiple Vitamin (MULTIVITAMIN WITH MINERALS) TABS Take 1 tablet by mouth as needed.     . naphazoline (NAPHCON) 0.1 % ophthalmic solution Place 1 drop into both eyes daily.    . Omega-3 Fatty Acids (FISH OIL) 1000 MG CAPS Take by mouth daily as needed.    . pantoprazole (PROTONIX) 40 MG tablet Take 1 tablet (40 mg total) by mouth daily. 30 tablet 11  . Probiotic Product (PROBIOTIC DAILY PO) Take 1 tablet by mouth daily.      No current facility-administered medications on file prior to visit.     Past Medical History  Diagnosis Date  . Diabetes mellitus without complication ([place])   . Sinus problem   . Blind     one eye left  . GERD (gastroesophageal reflux disease)   . COPD (chronic obstructive pulmonary disease) ([place])   . Hypertension   . Tobacco use   . Coronary artery disease     Cardiac catheterization in September of 2015 showed an occluded mid RCA which was medium in size and codominant. Normal ejection fraction.     Past Surgical History  Procedure Laterality Date  .  Foot surgery Bilateral   . Anterior cervical decomp/discectomy fusion    . Gallbladder surgery    . Femur fracture surgery    . Eye surgery    . Nasal reconstruction    . Cardiac catheterization  12/07/2013    [facility]     Family History  Problem Relation Age of Onset  . Family history unknown: Yes     Social History   Social History  . Marital Status: Married    Spouse Name: N/A  . Number of Children: N/A  . Years of Education: N/A   Occupational History  . Not on file.   Social History Main Topics  . Smoking status: Former Smoker -- 1.00 packs/day    Types: Cigarettes    Quit date: 03/21/2014  . Smokeless tobacco: Never Used  . Alcohol Use: No  . Drug Use: No  . Sexual  Activity: Not on file   Other Topics Concern  . Not on file   Social History Narrative     ROS A 10 point review of system was performed. It is negative other than that mentioned in the history of present illness.   PHYSICAL EXAM   BP 130/64 mmHg  Pulse 59  Ht '5\' 10"'$  (1.778 m)  Wt 257 lb 4 oz (116.688 kg)  BMI 36.91 kg/m2 Constitutional: He is oriented to person, place, and time. He appears well-developed and well-nourished. No distress.  HENT: No nasal discharge.  Head: Normocephalic and atraumatic.  Eyes: Pupils are equal and round.  No discharge. Neck: Normal range of motion. Neck supple. No JVD present. No thyromegaly present.  Cardiovascular: Normal rate, regular rhythm, normal heart sounds. Exam reveals no gallop and no friction rub. No murmur heard.  Pulmonary/Chest: Effort normal and breath sounds normal. No stridor. No respiratory distress. He has no wheezes. He has no rales. He exhibits no tenderness.  Abdominal: Soft. Bowel sounds are normal. He exhibits no distension. There is no tenderness. There is no rebound and no guarding.  Musculoskeletal: Normal range of motion. He exhibits no edema and no tenderness.  Neurological: He is alert and oriented to person, place, and time. Coordination normal.  Skin: Skin is warm and dry. No rash noted. He is not diaphoretic. No erythema. No pallor.  Psychiatric: He has a normal mood and affect. His behavior is normal. Judgment and thought content normal.  Distal pulses are normal.      EKG: Sinus  Bradycardia  -First degree A-V block  -   ASSESSMENT AND PLAN

## 2015-05-13 NOTE — Assessment & Plan Note (Signed)
He is doing well with no angina. Continue medical therapy.

## 2015-05-13 NOTE — Patient Instructions (Signed)
Medication Instructions:  Your physician recommends that you continue on your current medications as directed. Please refer to the Current Medication list given to you today.   Labwork: none  Testing/Procedures: none  Follow-Up: Your physician wants you to follow-up in: six months with Dr. Fletcher Anon.  You will receive a reminder letter in the mail two months in advance. If you don't receive a letter, please call our office to schedule the follow-up appointment.   Any Other Special Instructions Will Be Listed Below (If Applicable).     If you need a refill on your cardiac medications before your next appointment, please call your pharmacy.

## 2015-05-13 NOTE — Assessment & Plan Note (Signed)
Lab Results  Component Value Date   CHOL 88* 01/16/2014   HDL 30* 01/16/2014   LDLCALC 41 01/16/2014   TRIG 85 01/16/2014   CHOLHDL 2.9 01/16/2014   Continue atorvastatin.

## 2015-05-31 ENCOUNTER — Other Ambulatory Visit: Payer: Self-pay | Admitting: Neurological Surgery

## 2015-05-31 DIAGNOSIS — M5416 Radiculopathy, lumbar region: Secondary | ICD-10-CM

## 2015-06-09 ENCOUNTER — Ambulatory Visit
Admission: RE | Admit: 2015-06-09 | Discharge: 2015-06-09 | Disposition: A | Discharge status: Home / Self Care | Payer: Medicare Other | Source: Ambulatory Visit | Attending: Neurological Surgery | Admitting: Neurological Surgery

## 2015-06-09 DIAGNOSIS — M5416 Radiculopathy, lumbar region: Secondary | ICD-10-CM

## 2015-06-19 DIAGNOSIS — M48061 Spinal stenosis, lumbar region without neurogenic claudication: Secondary | ICD-10-CM | POA: Insufficient documentation

## 2015-08-16 ENCOUNTER — Telehealth: Payer: Self-pay | Admitting: Cardiovascular Disease

## 2015-08-16 NOTE — Telephone Encounter (Signed)
Pt states he is having back surgery on 6/22, by Dr. Ellene Route. They ask he stay off his blood thinner 1 week prior.

## 2015-08-19 NOTE — Telephone Encounter (Signed)
He is at low risk from a cardiac standpoint. Plavix can be held 1 week before surgery.

## 2015-08-20 NOTE — Telephone Encounter (Signed)
Faxed clearance letter  to Howard Maxwell, surgical scheduler for Dr. [name] at [facility], [phone] Pt to have back surgery September 13, 2015.

## 2015-08-23 ENCOUNTER — Telehealth: Payer: Self-pay | Admitting: Cardiovascular Disease

## 2015-08-23 NOTE — Telephone Encounter (Signed)
Pt calling stating the past few morning his HR has been running low Says its running around 40-45   today 126-77 with HR of 45 Yesterday 123/74 HR of 58  Thinks it may be his metoprolol, he's been on it for a year he said.  Please advise.

## 2015-08-23 NOTE — Telephone Encounter (Signed)
S/w pt who states HR normally runs 55-65bpm however, he reports HR 58, 48, and 45 for past 3 consecutive mornings.  This is before taking '25mg'$  metoprolol. He does not check HR prior to evening dose. Pt has also  been taking '100mg'$  tramadol PRN for back pain.  He has 5-'325mg'$  vicodin but reports he has not taken this as often.   He has had no other medication changes. He is scheduled for back surgery June 23. He took [place] '10mg'$  two nights ago but it made him too lethargic during the day and will not take it again. Reports BP normal and is asymptomatic.  He has been dieting and has lost 30lbs. Advised pt to check HR prior to taking metoprolol and hold if HR is lower than his normal rate. I will forward to Dr. Fletcher Anon to review and advise.

## 2015-08-23 NOTE — Telephone Encounter (Signed)
S/w pt of Dr. Tyrell Antonio recommendations. Pt states he is not dizzy or tired and understands to continue to monitor and notify us if he becomes symptomatic. Advised pt to call the On Call this weekend if he has further concerns before Monday. He verbalized understanding and is appreciative of the call.

## 2015-08-23 NOTE — Telephone Encounter (Signed)
If he is asymptomatic, he does not need to worry about this. If he is having dizziness or extreme fatigue, we can decrease the dose of metoprolol to 12.5 mg twice daily.

## 2015-11-14 ENCOUNTER — Encounter (INDEPENDENT_AMBULATORY_CARE_PROVIDER_SITE_OTHER): Payer: Self-pay

## 2015-11-14 ENCOUNTER — Ambulatory Visit (INDEPENDENT_AMBULATORY_CARE_PROVIDER_SITE_OTHER): Payer: Medicare Other | Admitting: Cardiovascular Disease

## 2015-11-14 ENCOUNTER — Encounter: Payer: Self-pay | Admitting: Cardiovascular Disease

## 2015-11-14 VITALS — BP 94/60 | HR 63 | Ht 70.0 in | Wt 235.8 lb

## 2015-11-14 DIAGNOSIS — I10 Essential (primary) hypertension: Secondary | ICD-10-CM | POA: Diagnosis not present

## 2015-11-14 DIAGNOSIS — E785 Hyperlipidemia, unspecified: Secondary | ICD-10-CM

## 2015-11-14 DIAGNOSIS — I471 Supraventricular tachycardia: Secondary | ICD-10-CM

## 2015-11-14 DIAGNOSIS — I2510 Atherosclerotic heart disease of native coronary artery without angina pectoris: Secondary | ICD-10-CM | POA: Diagnosis not present

## 2015-11-14 MED ORDER — TRAMADOL HCL 50 MG PO TABS
50.0000 mg | ORAL_TABLET | Freq: Four times a day (QID) | ORAL | 0 refills | Status: DC | PRN
Start: 2015-11-14 — End: 2016-05-12

## 2015-11-14 MED ORDER — DILTIAZEM HCL ER 180 MG PO CP24: 180.0000 mg | ORAL_CAPSULE | Freq: Every day | ORAL | 6 refills | Status: DC

## 2015-11-14 NOTE — Patient Instructions (Signed)
Medication Instructions:  Your physician has recommended you make the following change in your medication:  STOP taking amlodipine STOP taking metoprolol START taking diltiazem '180mg'$  once daily  Labwork: none  Testing/Procedures: none  Follow-Up: Your physician wants you to follow-up in: 6 months with Dr. Fletcher Anon.  You will receive a reminder letter in the mail two months in advance. If you don't receive a letter, please call our office to schedule the follow-up appointment.   Any Other Special Instructions Will Be Listed Below (If Applicable).     If you need a refill on your cardiac medications before your next appointment, please call your pharmacy.

## 2015-11-14 NOTE — Progress Notes (Signed)
Cardiology Office Note   Date:  11/14/2015   ID:  Howard Maxwell, DOB [birth year], MRN [id number]  PCP:  Tracie Harrier, MD  Cardiologist:   Kathlyn Sacramento, MD   Chief Complaint  Patient presents with  . Other    6 month f/u no complaints today. Meds reviewed verbally with pt.      History of Present Illness: Howard Maxwell is a 70 y.o. male who presents for a followup visit regarding coronary artery disease and supraventricular tachycardia.  He has known history of type 2 diabetes diagnosed in 2010, hypertension, tobacco use and obesity. He also has family history of coronary artery disease.  He presented in September 2015 with unstable angina.  Cardiac catheterization in September 2015  showed  an occluded mid RCA which was medium in size and codominant. Normal ejection fraction. He was treated medically.  He quit smoking since then. During cardiac rehabilitation, he had an episode of tachycardia likely SVT. He was treated with metoprolol with no recurrent episodes.  He has been doing reasonably well with no recurrent anginal symptoms. 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 He started following healthier diet and has been able to lose about 40 pounds since last year. A pressure has been running low recently. He complains of erectile dysfunction and he thinks it's due to some of his antihypertensive medications.   Past Medical History:  Diagnosis Date  . Blind    one eye left  . COPD (chronic obstructive pulmonary disease) ([place])   . Coronary artery disease    Cardiac catheterization in September of 2015 showed an occluded mid RCA which was medium in size and codominant. Normal ejection fraction.  . Diabetes mellitus without complication ([place])   . GERD (gastroesophageal reflux disease)   . Hypertension   . Sinus problem   . Tobacco use     Past Surgical History:  Procedure Laterality Date  . ANTERIOR CERVICAL DECOMP/DISCECTOMY FUSION    . BACK SURGERY     . CARDIAC CATHETERIZATION  12/07/2013   [facility]  . EYE SURGERY    . FEMUR FRACTURE SURGERY    . FOOT SURGERY Bilateral   . GALLBLADDER SURGERY    . NASAL RECONSTRUCTION       Current Outpatient Prescriptions  Medication Sig Dispense Refill  . amLODipine (NORVASC) 10 MG tablet Take 10 mg by mouth daily.    Marland Kitchen atorvastatin (LIPITOR) 40 MG tablet Take 1 tablet (40 mg total) by mouth daily. 30 tablet 6  . baclofen (LIORESAL) 10 MG tablet Take 0.5 tablets (5 mg total) by mouth 3 (three) times daily as needed for muscle spasms. 5 each 0  . clopidogrel (PLAVIX) 75 MG tablet Take 1 tablet (75 mg total) by mouth daily. 30 tablet 6  . glipiZIDE (GLUCOTROL) 5 MG tablet Take 5 mg by mouth daily before breakfast.     . hydrochlorothiazide (HYDRODIURIL) 25 MG tablet Take 25 mg by mouth daily.    Marland Kitchen ibuprofen (ADVIL,MOTRIN) 800 MG tablet Take 1 tablet (800 mg total) by mouth 3 (three) times daily. (Patient taking differently: Take 800 mg by mouth as needed. ) 21 tablet 0  . losartan (COZAAR) 50 MG tablet Take 50 mg by mouth daily.    . metFORMIN (GLUCOPHAGE) 850 MG tablet Take 850 mg by mouth 2 (two) times daily with a meal.    . metoprolol tartrate (LOPRESSOR) 25 MG tablet Take 1 tablet (25 mg total) by mouth 2 (two) times  daily. 180 tablet 3  . naphazoline (NAPHCON) 0.1 % ophthalmic solution Place 1 drop into both eyes as needed.     . pantoprazole (PROTONIX) 40 MG tablet Take 1 tablet (40 mg total) by mouth daily. 30 tablet 11  . Probiotic Product (PROBIOTIC DAILY PO) Take 1 tablet by mouth daily.     . traMADol (ULTRAM) 50 MG tablet Take 1 tablet (50 mg total) by mouth every 6 (six) hours as needed. 30 tablet 0   No current facility-administered medications for this visit.     Allergies:   Codeine sulfate and Loratadine    Social History:  The patient  reports that he quit smoking about 19 months ago. His smoking use included Cigarettes. He smoked 1.00 pack per day. He has never used smokeless  tobacco. He reports that he does not drink alcohol or use drugs.   Family History:  The patient's Family history is unknown by patient.    ROS:  Please see the history of present illness.   Otherwise, review of systems are positive for none.   All other systems are reviewed and negative.    PHYSICAL EXAM: VS:  BP 94/60 (BP Location: Left Arm, Patient Position: Sitting, Cuff Size: Large)   Pulse 63   Ht '5\' 10"'$  (1.778 m)   Wt 235 lb 12 oz (106.9 kg)   BMI 33.83 kg/m  , BMI Body mass index is 33.83 kg/m. GEN: Well nourished, well developed, in no acute distress  HEENT: normal  Neck: no JVD, carotid bruits, or masses Cardiac: RRR; no murmurs, rubs, or gallops,no edema  Respiratory:  clear to auscultation bilaterally, normal work of breathing GI: soft, nontender, nondistended, + BS MS: no deformity or atrophy  Skin: warm and dry, no rash Neuro:  Strength and sensation are intact Psych: euthymic mood, full affect   EKG:  EKG is ordered today. The ekg ordered today demonstrates  Normal sinus rhythm with sinus arrhythmia. Possible old inferior infarct.   Recent Labs: No results found for requested labs within last 8760 hours.    Lipid Panel    Component Value Date/Time   CHOL 88 (L) 01/16/2014 0805   TRIG 85 01/16/2014 0805   HDL 30 (L) 01/16/2014 0805   CHOLHDL 2.9 01/16/2014 0805   LDLCALC 41 01/16/2014 0805      Wt Readings from Last 3 Encounters:  11/14/15 235 lb 12 oz (106.9 kg)  05/13/15 257 lb 4 oz (116.7 kg)  02/16/15 265 lb (120.2 kg)       ASSESSMENT AND PLAN:  1.  Coronary artery disease involving native coronary arteries without angina: He is overall doing well. Continue medical therapy.  2. Paroxysmal supraventricular tachycardia: He thinks that metoprolol might be causing erectile dysfunction. Thus, I elected to switch this to diltiazem.  3. Essential hypertension: Blood pressure has been running low which could be due to the fact that he has been  losing weight. In order to simplify his medications, I discontinued metoprolol for the above-mentioned reasons, I discontinued amlodipine and put him on diltiazem extended release 180 mg once daily.  4. Hyperlipidemia: Continue treatment with atorvastatin 40 mg daily. His LDL has been consistently below 70.  5. Erectile dysfunction: I explained to him that this could be due to his diabetes and some other vascular disease. Medications might be contributing especially metoprolol and hydrochlorothiazide. Metoprolol was discontinued as outlined above.   Disposition:   FU with me in 6 months  Signed,  Kathlyn Sacramento, MD  11/14/2015 10:20 AM    [facility]

## 2016-03-23 DIAGNOSIS — K284 Chronic or unspecified gastrojejunal ulcer with hemorrhage: Secondary | ICD-10-CM

## 2016-03-23 HISTORY — DX: Chronic or unspecified gastrojejunal ulcer with hemorrhage: K28.4

## 2016-03-30 ENCOUNTER — Telehealth: Payer: Self-pay | Admitting: Cardiovascular Disease

## 2016-03-30 NOTE — Telephone Encounter (Signed)
Pt c/o BP issue: STAT if pt c/o blurred vision, one-sided weakness or slurred speech  1. What are your last 5 BP readings?  All these readings are in the morning 1/4-179/94 1/5-167/88 1/6-167/90 1/7-184/92 1/8-174/91  2. Are you having any other symptoms (ex. Dizziness, headache, blurred vision, passed out)? no  3. What is your BP issue? elevated

## 2016-03-30 NOTE — Telephone Encounter (Signed)
S/w patient. He reports his BP has remained elevated over the past few months. Most recent readings from this past week are: 1/4-179/94 1/5-167/88 1/6-167/90 1/7-184/92 1/8-174/91  He denies Headache, CP, SOB, Dizziness. He has been exercising by walking and going to the gym regularly.  Patient is also concerned that his Diltiazem will move to Tier 3 drug with his insurance started Mar 23, 2016. Currently, he has half a month's supply of diltiazem. He is taking his diltiazem, HCTZ, losartan as prescribed. He was started on diltiazem at last office visit 11/14/15 at the same time his Metoprolol and amlodipine were discontinued and patient reports that's when he started seeing the BP elevation trend begin.  Patient due for 6 month f/u in February and is scheduled to see Dr Fletcher Anon on 04/24/16.  Routing to Dr Fletcher Anon for advice.

## 2016-03-31 MED ORDER — CARVEDILOL 6.25 MG PO TABS: 6.2500 mg | ORAL_TABLET | Freq: Two times a day (BID) | ORAL | 3 refills | Status: DC

## 2016-03-31 NOTE — Telephone Encounter (Signed)
Called patient. He is out of town until Friday so he will not start the new medication regimen until Saturday. He will call our office next Thursday or Friday with BP and HR readings.

## 2016-03-31 NOTE — Telephone Encounter (Signed)
Called patient. In speaking with him today, he stated he thought he was already taking Losartan '100mg'$  daily as increased by Dr Ginette Pitman about 6 months ago. Yesterday, patient was not at home when we reviewed medications and had stated the dosage was correct. Patient verbalized understanding to stop diltiazem and start carvedilol 6.'25mg'$  by mouth twice a day. He will continue on the losartan 100 mg daily. He has f/u with Dr Fletcher Anon on 04/24/16. Patient will continue to monitor his BP.  Will route to Dr Fletcher Anon to let him know patient already taking Losartan '100mg'$  daily and for advice.

## 2016-03-31 NOTE — Telephone Encounter (Signed)
Stop Diltiazem. Start Carvedilol 6.25 mg bid and Increase Losartan to 100 mg once daily. Schedule routine follow up to recheck.

## 2016-03-31 NOTE — Telephone Encounter (Signed)
Ok. He should call us back in 1 week with BP and HR readings to see if we need to increase Carvedilol or resume Amlodipine.

## 2016-04-15 ENCOUNTER — Telehealth: Payer: Self-pay | Admitting: Cardiovascular Disease

## 2016-04-15 ENCOUNTER — Other Ambulatory Visit: Payer: Self-pay

## 2016-04-15 ENCOUNTER — Emergency Department
Admission: EM | Admit: 2016-04-15 | Discharge: 2016-04-15 | Disposition: A | Discharge status: Home / Self Care | Payer: Medicare Other | Attending: Emergency Medicine | Admitting: Emergency Medicine

## 2016-04-15 ENCOUNTER — Emergency Department: Payer: Medicare Other

## 2016-04-15 ENCOUNTER — Encounter: Payer: Self-pay | Admitting: Emergency Medicine

## 2016-04-15 DIAGNOSIS — I10 Essential (primary) hypertension: Secondary | ICD-10-CM | POA: Diagnosis not present

## 2016-04-15 DIAGNOSIS — R0600 Dyspnea, unspecified: Secondary | ICD-10-CM

## 2016-04-15 DIAGNOSIS — Z7984 Long term (current) use of oral hypoglycemic drugs: Secondary | ICD-10-CM | POA: Insufficient documentation

## 2016-04-15 DIAGNOSIS — R0602 Shortness of breath: Secondary | ICD-10-CM | POA: Insufficient documentation

## 2016-04-15 DIAGNOSIS — E119 Type 2 diabetes mellitus without complications: Secondary | ICD-10-CM | POA: Insufficient documentation

## 2016-04-15 DIAGNOSIS — Z79899 Other long term (current) drug therapy: Secondary | ICD-10-CM | POA: Diagnosis not present

## 2016-04-15 DIAGNOSIS — Z87891 Personal history of nicotine dependence: Secondary | ICD-10-CM | POA: Insufficient documentation

## 2016-04-15 DIAGNOSIS — J449 Chronic obstructive pulmonary disease, unspecified: Secondary | ICD-10-CM | POA: Diagnosis not present

## 2016-04-15 DIAGNOSIS — I2510 Atherosclerotic heart disease of native coronary artery without angina pectoris: Secondary | ICD-10-CM | POA: Insufficient documentation

## 2016-04-15 HISTORY — DX: Other chronic pain: G89.29

## 2016-04-15 HISTORY — DX: Dorsalgia, unspecified: M54.9

## 2016-04-15 LAB — CBC
HCT: 35.2 % — ABNORMAL LOW (ref 40.0–52.0)
Hemoglobin: 12.2 g/dL — ABNORMAL LOW (ref 13.0–18.0)
MCH: 29.4 pg (ref 26.0–34.0)
MCHC: 34.7 g/dL (ref 32.0–36.0)
MCV: 84.8 fL (ref 80.0–100.0)
Platelets: 226 10*3/uL (ref 150–440)
RBC: 4.15 MIL/uL — ABNORMAL LOW (ref 4.40–5.90)
RDW: 14.5 % (ref 11.5–14.5)
WBC: 8.3 10*3/uL (ref 3.8–10.6)

## 2016-04-15 LAB — TROPONIN I
Troponin I: <0.03 ng/mL (ref <0.03)
Troponin I: <0.03 ng/mL (ref <0.03)

## 2016-04-15 LAB — BASIC METABOLIC PANEL
Anion gap: 9 (ref 5–15)
BUN: 19 mg/dL (ref 6–20)
CO2: 27 mmol/L (ref 22–32)
Calcium: 8.9 mg/dL (ref 8.9–10.3)
Chloride: 101 mmol/L (ref 101–111)
Creatinine, Ser: 0.69 mg/dL (ref 0.61–1.24)
GFR calc Af Amer: >60 mL/min (ref >60)
GFR calc non Af Amer: >60 mL/min (ref >60)
Glucose, Bld: 152 mg/dL — ABNORMAL HIGH (ref 65–99)
Potassium: 3.4 mmol/L — ABNORMAL LOW (ref 3.5–5.1)
Sodium: 137 mmol/L (ref 135–145)

## 2016-04-15 LAB — FIBRIN DERIVATIVES D-DIMER (ARMC ONLY): Fibrin derivatives D-dimer (ARMC): 471 (ref 0–499)

## 2016-04-15 MED ORDER — CLONIDINE HCL 0.1 MG PO TABS
0.1000 mg | ORAL_TABLET | Freq: Once | ORAL | Status: AC
  Administered 2016-04-15: 0.1 mg via ORAL
  Filled 2016-04-15: qty 1

## 2016-04-15 MED ORDER — SPIRONOLACTONE 25 MG PO TABS: 25.0000 mg | ORAL_TABLET | Freq: Every day | ORAL | 3 refills | Status: DC

## 2016-04-15 MED ORDER — IOPAMIDOL (ISOVUE-370) INJECTION 76%
75.0000 mL | Freq: Once | INTRAVENOUS | Status: AC | PRN
  Administered 2016-04-15: 75 mL via INTRAVENOUS

## 2016-04-15 NOTE — Telephone Encounter (Signed)
Left message on machine for patient to contact the office.  Prescription submitted, orders placed and scheduled

## 2016-04-15 NOTE — Telephone Encounter (Signed)
Pt states he was in the ED late last night due to SOB States he was given Clonidine at 4:48 am  Pt c/o BP issue: STAT if pt c/o blurred vision, one-sided weakness or slurred speech  1. What are your last 5 BP readings?  1/24 am- 10 min ago 169/90 1/23 am-183/93 1/22-190/97 1/21-179/91 1/19-164/88   2. Are you having any other symptoms (ex. Dizziness, headache, blurred vision, passed out)? SOB, went to ED late last night   3. What is your BP issue? elevated

## 2016-04-15 NOTE — ED Triage Notes (Signed)
Pt presents to ED with c/o shortness of breath starting this morning. Pt reports woke up this morning feeling as if he could not get a "full breath of air" Pt reports hx of COPD. Pt denies hx of similar symptoms. Pt c/o chronic back pain, states had back pain tonight radiating to abdomen when he had episode. Pt denies chest pain, dizziness or weakness. Pt speaking in complete sentences. Reports non-productive cough starting this morning as well.

## 2016-04-15 NOTE — ED Provider Notes (Signed)
[facility] Emergency Department Provider Note    First MD Initiated Contact with Patient 04/15/16 0112     (approximate)  I have reviewed the triage vital signs and the nursing notes.   HISTORY  Chief Complaint Shortness of Breath    HPI Howard Maxwell is a 71 y.o. male with bolus of chronic medical conditions presents with dyspnea following intercourse tonight. Patient states that he felt as though he could not take a deep breath however that symptoms since resolved. Patient denies any chest pain no nausea vomiting no dizziness.   Past Medical History:  Diagnosis Date  . Blind    one eye left  . Chronic back pain   . COPD (chronic obstructive pulmonary disease) ([place])   . Coronary artery disease    Cardiac catheterization in September of 2015 showed an occluded mid RCA which was medium in size and codominant. Normal ejection fraction.  . Diabetes mellitus without complication ([place])   . GERD (gastroesophageal reflux disease)   . Hypertension   . Sinus problem   . Tobacco use     Patient Active Problem List   Diagnosis Date Noted  . Back pain 05/13/2015  . PSVT (paroxysmal supraventricular tachycardia) ([place]) 01/15/2014  . Hyperlipidemia 12/14/2013  . Coronary artery disease   . Unstable angina ([place]) 12/06/2013  . Hypertension   . Tobacco use     Past Surgical History:  Procedure Laterality Date  . ANTERIOR CERVICAL DECOMP/DISCECTOMY FUSION    . BACK SURGERY    . CARDIAC CATHETERIZATION  12/07/2013   [facility]  . EYE SURGERY    . FEMUR FRACTURE SURGERY    . FOOT SURGERY Bilateral   . GALLBLADDER SURGERY    . NASAL RECONSTRUCTION      Prior to Admission medications   Medication Sig Start Date End Date Taking? Authorizing Provider  baclofen (LIORESAL) 10 MG tablet Take 0.5 tablets (5 mg total) by mouth 3 (three) times daily as needed for muscle spasms. 02/16/15  Yes Kayla Rose, PA-C  carvedilol (COREG) 6.25 MG tablet Take 1 tablet (6.25  mg total) by mouth 2 (two) times daily. 03/31/16  Yes Wellington Hampshire, MD  clopidogrel (PLAVIX) 75 MG tablet Take 1 tablet (75 mg total) by mouth daily. 12/07/13  Yes Wellington Hampshire, MD  glipiZIDE (GLUCOTROL) 5 MG tablet Take 5 mg by mouth daily before breakfast.    Yes Historical Provider, MD  hydrochlorothiazide (HYDRODIURIL) 25 MG tablet Take 25 mg by mouth daily.   Yes Historical Provider, MD  ibuprofen (ADVIL,MOTRIN) 800 MG tablet Take 1 tablet (800 mg total) by mouth 3 (three) times daily. Patient taking differently: Take 800 mg by mouth as needed.  02/16/15  Yes Kayla Rose, PA-C  losartan (COZAAR) 50 MG tablet Take 100 mg by mouth daily.    Yes Historical Provider, MD  metFORMIN (GLUCOPHAGE) 850 MG tablet Take 850 mg by mouth 2 (two) times daily with a meal.   Yes Historical Provider, MD  naphazoline (NAPHCON) 0.1 % ophthalmic solution Place 1 drop into both eyes as needed.    Yes Historical Provider, MD  pantoprazole (PROTONIX) 40 MG tablet Take 1 tablet (40 mg total) by mouth daily. 12/07/13  Yes Wellington Hampshire, MD  atorvastatin (LIPITOR) 40 MG tablet Take 1 tablet (40 mg total) by mouth daily. 12/07/13   Wellington Hampshire, MD  Probiotic Product (PROBIOTIC DAILY PO) Take 1 tablet by mouth daily.     Historical Provider, MD  traMADol (ULTRAM) 50 MG tablet Take 1 tablet (50 mg total) by mouth every 6 (six) hours as needed. 11/14/15   Wellington Hampshire, MD    Allergies Codeine sulfate and Loratadine  Family History  Problem Relation Age of Onset  . Family history unknown: Yes    Social History Social History  Substance Use Topics  . Smoking status: Former Smoker    Packs/day: 1.00    Types: Cigarettes    Quit date: 03/21/2014  . Smokeless tobacco: Never Used  . Alcohol use No    Review of Systems Constitutional: No fever/chills Eyes: No visual changes. ENT: No sore throat. Cardiovascular: Denies chest pain. Respiratory: Denies shortness of breath. Gastrointestinal: No  abdominal pain.  No nausea, no vomiting.  No diarrhea.  No constipation. Genitourinary: Negative for dysuria. Musculoskeletal: Negative for back pain. Skin: Negative for rash. Neurological: Negative for headaches, focal weakness or numbness.  10-point ROS otherwise negative.  ____________________________________________   PHYSICAL EXAM:  VITAL SIGNS: ED Triage Vitals  Enc Vitals Group     BP 04/15/16 0041 (!) 174/85     Pulse Rate 04/15/16 0041 69     Resp 04/15/16 0041 20     Temp 04/15/16 0041 98.2 F (36.8 C)     Temp Source 04/15/16 0041 Oral     SpO2 04/15/16 0041 92 %     Weight 04/15/16 0042 240 lb (108.9 kg)     Height 04/15/16 0042 '5\' 10"'$  (1.778 m)     Head Circumference --      Peak Flow --      Pain Score 04/15/16 0044 5     Pain Loc --      Pain Edu? --      Excl. in [place]? --     Constitutional: Alert and oriented. Well appearing and in no acute distress. Eyes: Conjunctivae are normal. PERRL. EOMI. Head: Atraumatic. Mouth/Throat: Mucous membranes are moist.  Oropharynx non-erythematous. Neck: No stridor.   Cardiovascular: Normal rate, regular rhythm. Good peripheral circulation. Grossly normal heart sounds. Respiratory: Normal respiratory effort.  No retractions. Lungs CTAB. Gastrointestinal: Soft and nontender. No distention.  Musculoskeletal: No lower extremity tenderness nor edema. No gross deformities of extremities. Neurologic:  Normal speech and language. No gross focal neurologic deficits are appreciated.  Skin:  Skin is warm, dry and intact. No rash noted. Psychiatric: Mood and affect are normal. Speech and behavior are normal.  ____________________________________________   LABS (all labs ordered are listed, but only abnormal results are displayed)  Labs Reviewed  BASIC METABOLIC PANEL - Abnormal; Notable for the following:       Result Value   Potassium 3.4 (*)    Glucose, Bld 152 (*)    All other components within normal limits  CBC -  Abnormal; Notable for the following:    RBC 4.15 (*)    Hemoglobin 12.2 (*)    HCT 35.2 (*)    All other components within normal limits  TROPONIN I  FIBRIN DERIVATIVES D-DIMER ([facility] ONLY)  TROPONIN I   ____________________________________________  EKG  ED ECG REPORT I, Clare N Diaz Crago, the attending physician, personally viewed and interpreted this ECG.   Date: 04/16/2016  EKG Time: 12:49 AM  Rate: 68  Rhythm: Normal sinus rhythm  Axis: Normal  Intervals: Normal  ST&T Change: None  ____________________________________________  RADIOLOGY I, [place] Lewie Deman, personally viewed and evaluated these images (plain radiographs) as part of my medical decision making, as well as reviewing the written report  by the radiologist.  Dg Chest 2 View  Result Date: 04/15/2016 CLINICAL DATA:  Dyspnea EXAM: CHEST  2 VIEW COMPARISON:  01/12/2014 FINDINGS: Heart size is within normal limits. Minimal aortic atherosclerosis. ACDF hardware seen along the cervical spine. Chronic interstitial prominence noted bilaterally without pneumonic consolidation, effusion or pneumothorax. Bilateral nipple shadows project over the lung bases. IMPRESSION: No active cardiopulmonary disease. Aortic atherosclerosis. ACDF of the cervical spine. Electronically Signed   By: Ashley Royalty M.D.   On: 04/15/2016 01:16   Ct Angio Chest Pe W And/or Wo Contrast  Result Date: 04/15/2016 CLINICAL DATA:  71 year old male with shortness of breath. History of COPD. EXAM: CT ANGIOGRAPHY CHEST WITH CONTRAST TECHNIQUE: Multidetector CT imaging of the chest was performed using the standard protocol during bolus administration of intravenous contrast. Multiplanar CT image reconstructions and MIPs were obtained to evaluate the vascular anatomy. CONTRAST:  75 cc Isovue 370 COMPARISON:  Chest radiograph dated 04/15/2016 FINDINGS: Cardiovascular: There is no cardiomegaly or pericardial effusion. Coronary vascular calcification predominantly  involving the LAD. The thoracic aorta appears unremarkable. The origins of the great vessels of the aortic arch appear patent. No CT evidence of pulmonary embolism. Mediastinum/Nodes: There is no hilar or mediastinal adenopathy. The esophagus and the thyroid gland are grossly unremarkable. Lungs/Pleura: There is diffuse hazy density throughout the lungs with areas of air trapping. There is no focal consolidation, pleural effusion, or pneumothorax. The central airways are patent. Upper Abdomen: Cholecystectomy. Left adrenal thickening/ hyperplasia. The visualized upper abdomen is otherwise unremarkable. Musculoskeletal: There is degenerative changes of the spine with osteophyte formation. No acute fracture. Lower cervical anterior fixation hardware is partially visualized. A 4 mm apparent lucency in the anterior aspect of the right seventh rib (series 4, image 73) is indeterminate. Review of the MIP images confirms the above findings. IMPRESSION: No CT evidence of pulmonary embolism. Areas of air trapping within the lungs may be related to small vessel versus small airway disease. No focal consolidation. A 4 mm lucent focus in the anterior aspect of the right seventh rib, indeterminate. Electronically Signed   By: Anner Crete M.D.   On: 04/15/2016 03:19     Procedures    INITIAL IMPRESSION / ASSESSMENT AND PLAN / ED COURSE  Pertinent labs & imaging results that were available during my care of the patient were reviewed by me and considered in my medical decision making (see chart for details).  71 year-old male presenting to the emergency department with episode of dyspnea that has since resolved which occurred following sexual intercourse. Consider potential cardiac etiology troponin negative 2 EKG revealed no evidence of ischemia or infarct CT scan of the chest revealed no abnormality.      ____________________________________________  FINAL CLINICAL IMPRESSION(S) / ED DIAGNOSES  Final  diagnoses:  Dyspnea, unspecified type     MEDICATIONS GIVEN DURING THIS VISIT:  Medications  iopamidol (ISOVUE-370) 76 % injection 75 mL (75 mLs Intravenous Contrast Given 04/15/16 0251)     NEW OUTPATIENT MEDICATIONS STARTED DURING THIS VISIT:  New Prescriptions   No medications on file    Modified Medications   No medications on file    Discontinued Medications   No medications on file     Note:  This document was prepared using Dragon voice recognition software and may include unintentional dictation errors.    Gregor Hams, MD 04/16/16 [phone]

## 2016-04-15 NOTE — Telephone Encounter (Signed)
Pt presented to the [facility] ED early this morning d/t dyspnea. CXR negative for PE; pt reports he was given clonidine for elevated BP.    BP readings:  1/24 am- 10 min ago 169/90 1/23 am-183/93 1/22-190/97 1/21-179/91 1/19-164/88  HR upper 50s to low 60s. BP readings are early morning before he takes coreg and losartan and he does not recheck pressure. Advised him to recheck one hour after taking medications.   He has taken amlodipine and lisinopril in the past. Lisinopril d/c'd d/t cough. Per Jan 8 phone note: "He should call us back in 1 week with BP and HR readings to see if we need to increase Carvedilol or resume Amlodipine." Routed to MD for medication adjustment.

## 2016-04-15 NOTE — Telephone Encounter (Signed)
Add Spironolactone 25 mg once daily. check BMP in 1 week. Schedule renal artery duplex to see why his BP is not controlled on spite of multiple medications.

## 2016-04-15 NOTE — Telephone Encounter (Signed)
Pt was seen in ED on 04/14/16 for SOB  Pt already has appointment on 04/24/16 Please advise

## 2016-04-15 NOTE — Telephone Encounter (Signed)
Reviewed results and recommendations w/pt who verbalized understanding.  He will be out of town until Feb 2 but will have BMET drawn at [facility] outpatient lab that afternoon.  He will pick up spironolactone at [facility] today and continue to monitor BP. Pt will be out of town much of February; transferred to scheduling to adjust renal artery duplex and f/u appts.

## 2016-04-24 ENCOUNTER — Ambulatory Visit: Payer: Self-pay | Admitting: Cardiovascular Disease

## 2016-04-27 ENCOUNTER — Other Ambulatory Visit: Payer: Self-pay | Admitting: Cardiovascular Disease

## 2016-04-27 ENCOUNTER — Other Ambulatory Visit: Payer: Self-pay

## 2016-04-27 ENCOUNTER — Other Ambulatory Visit (INDEPENDENT_AMBULATORY_CARE_PROVIDER_SITE_OTHER): Payer: Medicare Other

## 2016-04-27 DIAGNOSIS — I10 Essential (primary) hypertension: Secondary | ICD-10-CM

## 2016-04-28 LAB — BASIC METABOLIC PANEL
BUN/Creatinine Ratio: 22 (ref 10–24)
BUN: 16 mg/dL (ref 8–27)
CO2: 25 mmol/L (ref 18–29)
Calcium: 9 mg/dL (ref 8.6–10.2)
Chloride: 98 mmol/L (ref 96–106)
Creatinine, Ser: 0.72 mg/dL — ABNORMAL LOW (ref 0.76–1.27)
GFR calc Af Amer: 109 mL/min/{1.73_m2} (ref >59)
GFR calc non Af Amer: 95 mL/min/{1.73_m2} (ref >59)
Glucose: 134 mg/dL — ABNORMAL HIGH (ref 65–99)
Potassium: 4.8 mmol/L (ref 3.5–5.2)
Sodium: 141 mmol/L (ref 134–144)

## 2016-04-30 ENCOUNTER — Encounter: Payer: Self-pay | Admitting: Cardiovascular Disease

## 2016-05-08 ENCOUNTER — Ambulatory Visit: Payer: Self-pay | Admitting: Cardiovascular Disease

## 2016-05-12 ENCOUNTER — Ambulatory Visit (INDEPENDENT_AMBULATORY_CARE_PROVIDER_SITE_OTHER): Payer: Medicare Other | Admitting: Cardiovascular Disease

## 2016-05-12 ENCOUNTER — Ambulatory Visit: Payer: Medicare Other

## 2016-05-12 ENCOUNTER — Encounter: Payer: Self-pay | Admitting: Cardiovascular Disease

## 2016-05-12 VITALS — BP 148/80 | HR 54 | Ht 70.0 in | Wt 245.5 lb

## 2016-05-12 DIAGNOSIS — E7800 Pure hypercholesterolemia, unspecified: Secondary | ICD-10-CM

## 2016-05-12 DIAGNOSIS — I471 Supraventricular tachycardia: Secondary | ICD-10-CM

## 2016-05-12 DIAGNOSIS — I2510 Atherosclerotic heart disease of native coronary artery without angina pectoris: Secondary | ICD-10-CM | POA: Diagnosis not present

## 2016-05-12 DIAGNOSIS — I10 Essential (primary) hypertension: Secondary | ICD-10-CM

## 2016-05-12 MED ORDER — AZILSARTAN-CHLORTHALIDONE 40-25 MG PO TABS: 1.0000 | ORAL_TABLET | Freq: Every day | ORAL | 3 refills | Status: DC

## 2016-05-12 NOTE — Patient Instructions (Addendum)
Medication Instructions:  Your physician has recommended you make the following change in your medication: STOP taking losartan STOP taking hydrochlorothiazide STOP taking plavix START taking edarbyclor 40/25 mg once daily  Labwork: BMET in one week at [facility]  Testing/Procedures: none  Follow-Up: Your physician recommends that you schedule a follow-up appointment in: one month with Dr. Fletcher Anon.    Any Other Special Instructions Will Be Listed Below (If Applicable).     If you need a refill on your cardiac medications before your next appointment, please call your pharmacy.

## 2016-05-12 NOTE — Progress Notes (Signed)
Cardiology Office Note   Date:  05/12/2016   ID:  Howard Maxwell, DOB Jul 28, 1945, MRN [id number]  PCP:  Tracie Harrier, MD  Cardiologist:   Kathlyn Sacramento, MD   Chief Complaint  Patient presents with  . other    Patient was in hospital for SOB with excertion. Meds reviewed verbally with patient.       History of Present Illness: Howard Maxwell is a 71 y.o. male who presents for a followup visit regarding coronary artery disease, hypertension and supraventricular tachycardia.  He has known history of type 2 diabetes diagnosed in 2010, hypertension, tobacco use and obesity. He also has family history of coronary artery disease.  He presented in September 2015 with unstable angina.  Cardiac catheterization in September 2015  showed  an occluded mid RCA which was medium in size and codominant. Normal ejection fraction. He was treated medically.  He quit smoking since then. During cardiac rehabilitation, he had an episode of tachycardia likely SVT. He was treated with metoprolol with no recurrent episodes.  Last year, he started exercising regularly and was able to lose 40 pounds. As a result, we were able to get him off some of his antihypertensive medications. However, he gained 10 pounds and started having lower back pain which required taking NSAIDs. His blood pressure has gradually increased and we started adding blood pressure medications due to that reason. Most recently, spironolactone was added with improved blood pressure. His blood pressure improved significantly but still not optimal even on his home blood pressure readings. He went to the emergency room recently for acute onset shortness of breath after he had sex. His workup was negative. He had no recurrent episodes since then.  Past Medical History:  Diagnosis Date  . Blind    one eye left  . Chronic back pain   . COPD (chronic obstructive pulmonary disease) ([place])   . Coronary artery disease    Cardiac  catheterization in September of 2015 showed an occluded mid RCA which was medium in size and codominant. Normal ejection fraction.  . Diabetes mellitus without complication ([place])   . GERD (gastroesophageal reflux disease)   . Hypertension   . Sinus problem   . Tobacco use     Past Surgical History:  Procedure Laterality Date  . ANTERIOR CERVICAL DECOMP/DISCECTOMY FUSION    . BACK SURGERY    . CARDIAC CATHETERIZATION  12/07/2013   [facility]  . EYE SURGERY    . FEMUR FRACTURE SURGERY    . FOOT SURGERY Bilateral   . GALLBLADDER SURGERY    . NASAL RECONSTRUCTION       Current Outpatient Prescriptions  Medication Sig Dispense Refill  . atorvastatin (LIPITOR) 40 MG tablet Take 1 tablet (40 mg total) by mouth daily. 30 tablet 6  . baclofen (LIORESAL) 10 MG tablet Take 0.5 tablets (5 mg total) by mouth 3 (three) times daily as needed for muscle spasms. 5 each 0  . carvedilol (COREG) 6.25 MG tablet Take 1 tablet (6.25 mg total) by mouth 2 (two) times daily. 180 tablet 3  . clopidogrel (PLAVIX) 75 MG tablet Take 1 tablet (75 mg total) by mouth daily. 30 tablet 6  . glipiZIDE (GLUCOTROL) 5 MG tablet Take 5 mg by mouth daily before breakfast.     . hydrochlorothiazide (HYDRODIURIL) 25 MG tablet Take 25 mg by mouth daily.    Marland Kitchen ibuprofen (ADVIL,MOTRIN) 800 MG tablet Take 1 tablet (800 mg total) by mouth 3 (three) times daily. (Patient  taking differently: Take 800 mg by mouth as needed. ) 21 tablet 0  . losartan (COZAAR) 50 MG tablet Take 100 mg by mouth daily.     . metFORMIN (GLUCOPHAGE) 850 MG tablet Take 850 mg by mouth 2 (two) times daily with a meal.    . naphazoline (NAPHCON) 0.1 % ophthalmic solution Place 1 drop into both eyes as needed.     . pantoprazole (PROTONIX) 40 MG tablet Take 1 tablet (40 mg total) by mouth daily. 30 tablet 11  . Probiotic Product (PROBIOTIC DAILY PO) Take 1 tablet by mouth daily.     Marland Kitchen spironolactone (ALDACTONE) 25 MG tablet Take 1 tablet (25 mg total) by mouth  daily. 30 tablet 3   No current facility-administered medications for this visit.     Allergies:   Codeine sulfate and Loratadine    Social History:  The patient  reports that he quit smoking about 2 years ago. His smoking use included Cigarettes. He smoked 1.00 pack per day. He has never used smokeless tobacco. He reports that he does not drink alcohol or use drugs.   Family History:  The patient's Family history is unknown by patient.    ROS:  Please see the history of present illness.   Otherwise, review of systems are positive for none.   All other systems are reviewed and negative.    PHYSICAL EXAM: VS:  BP (!) 148/80 (BP Location: Left Arm, Patient Position: Sitting, Cuff Size: Normal)   Pulse (!) 54   Ht '5\' 10"'$  (1.778 m)   Wt 245 lb 8 oz (111.4 kg)   BMI 35.23 kg/m  , BMI Body mass index is 35.23 kg/m. GEN: Well nourished, well developed, in no acute distress  HEENT: normal  Neck: no JVD, carotid bruits, or masses Cardiac: RRR; no murmurs, rubs, or gallops,no edema  Respiratory:  clear to auscultation bilaterally, normal work of breathing GI: soft, nontender, nondistended, + BS MS: no deformity or atrophy  Skin: warm and dry, no rash Neuro:  Strength and sensation are intact Psych: euthymic mood, full affect   EKG:  EKG is ordered today. The ekg ordered today demonstrates sinus bradycardia with PACs.   Recent Labs: 04/15/2016: Hemoglobin 12.2; Platelets 226 04/27/2016: BUN 16; Creatinine, Ser 0.72; Potassium 4.8; Sodium 141    Lipid Panel    Component Value Date/Time   CHOL 88 (L) 01/16/2014 0805   TRIG 85 01/16/2014 0805   HDL 30 (L) 01/16/2014 0805   CHOLHDL 2.9 01/16/2014 0805   LDLCALC 41 01/16/2014 0805      Wt Readings from Last 3 Encounters:  05/12/16 245 lb 8 oz (111.4 kg)  04/15/16 240 lb (108.9 kg)  11/14/15 235 lb 12 oz (106.9 kg)       ASSESSMENT AND PLAN:  1.  Coronary artery disease involving native coronary arteries without angina:  He is overall doing well. Continue medical therapy. I elected to discontinue clopidogrel today and continue aspirin 81 mg daily.  2. Paroxysmal supraventricular tachycardia:  No recurrent arrhythmia.  3. Essential hypertension:  Blood pressure is still not controlled in spite of multiple adjustments of medications. He did improve significantly below. I elected to stop losartan and hydrochlorothiazide and switch him to Edarbyclor 40/25 mg once daily. Check basic metabolic profile in one week. He underwent renal artery ultrasound today which showed no evidence of renal artery stenosis.  4. Hyperlipidemia: Continue treatment with atorvastatin 40 mg daily. His LDL has been consistently below 70.  5.  Erectile dysfunction:  This improved after stopping metoprolol.  Disposition:   FU with me in 1 months  Signed,  Kathlyn Sacramento, MD  05/12/2016 3:26 PM    [place]

## 2016-05-13 ENCOUNTER — Encounter: Payer: Self-pay | Admitting: Cardiovascular Disease

## 2016-05-14 ENCOUNTER — Telehealth: Payer: Self-pay | Admitting: Cardiovascular Disease

## 2016-05-14 NOTE — Telephone Encounter (Signed)
Tier exception denied for [employer]. Awaiting appeal process. Pt reports $100 copay for medication. [place] and has had prescription transferred to [facility] there. He will pay $100 this month but will be unable to afford it every month. He will pick up samples next week when he returns home and have labs drawn March 2 @ [facility] outpatient lab.  Samples of this drug were given to the patient, quantity 2 bottles, Lot Number [id number]

## 2016-05-19 NOTE — Telephone Encounter (Signed)
Faxed Edarbyclor information requested on Monday, Feb 26 to [phone]

## 2016-05-20 DIAGNOSIS — M48062 Spinal stenosis, lumbar region with neurogenic claudication: Secondary | ICD-10-CM | POA: Insufficient documentation

## 2016-05-22 ENCOUNTER — Other Ambulatory Visit
Admission: RE | Admit: 2016-05-22 | Discharge: 2016-05-22 | Disposition: A | Discharge status: Home / Self Care | Payer: Medicare Other | Source: Ambulatory Visit | Attending: Cardiovascular Disease | Admitting: Cardiovascular Disease

## 2016-05-22 DIAGNOSIS — I471 Supraventricular tachycardia: Secondary | ICD-10-CM | POA: Insufficient documentation

## 2016-05-22 LAB — BASIC METABOLIC PANEL
Anion gap: 8 (ref 5–15)
BUN: 15 mg/dL (ref 6–20)
CO2: 29 mmol/L (ref 22–32)
Calcium: 8.9 mg/dL (ref 8.9–10.3)
Chloride: 101 mmol/L (ref 101–111)
Creatinine, Ser: 0.52 mg/dL — ABNORMAL LOW (ref 0.61–1.24)
GFR calc Af Amer: >60 mL/min (ref >60)
GFR calc non Af Amer: >60 mL/min (ref >60)
Glucose, Bld: 138 mg/dL — ABNORMAL HIGH (ref 65–99)
Potassium: 4.5 mmol/L (ref 3.5–5.1)
Sodium: 138 mmol/L (ref 135–145)

## 2016-05-25 ENCOUNTER — Other Ambulatory Visit: Payer: Self-pay | Admitting: Neurological Surgery

## 2016-05-25 DIAGNOSIS — M48062 Spinal stenosis, lumbar region with neurogenic claudication: Secondary | ICD-10-CM

## 2016-06-02 ENCOUNTER — Ambulatory Visit
Admission: RE | Admit: 2016-06-02 | Discharge: 2016-06-02 | Disposition: A | Discharge status: Home / Self Care | Payer: Medicare Other | Source: Ambulatory Visit | Attending: Neurological Surgery | Admitting: Neurological Surgery

## 2016-06-02 DIAGNOSIS — M48062 Spinal stenosis, lumbar region with neurogenic claudication: Secondary | ICD-10-CM

## 2016-06-02 MED ORDER — GADOBENATE DIMEGLUMINE 529 MG/ML IV SOLN
20.0000 mL | Freq: Once | INTRAVENOUS | Status: AC | PRN
  Administered 2016-06-02: 20 mL via INTRAVENOUS

## 2016-06-04 DIAGNOSIS — M5126 Other intervertebral disc displacement, lumbar region: Secondary | ICD-10-CM | POA: Insufficient documentation

## 2016-06-12 ENCOUNTER — Encounter: Payer: Self-pay | Admitting: Cardiovascular Disease

## 2016-06-12 ENCOUNTER — Ambulatory Visit (INDEPENDENT_AMBULATORY_CARE_PROVIDER_SITE_OTHER): Payer: Medicare Other | Admitting: Cardiovascular Disease

## 2016-06-12 VITALS — BP 140/82 | HR 58 | Ht 70.0 in | Wt 252.0 lb

## 2016-06-12 DIAGNOSIS — I471 Supraventricular tachycardia: Secondary | ICD-10-CM

## 2016-06-12 DIAGNOSIS — I10 Essential (primary) hypertension: Secondary | ICD-10-CM

## 2016-06-12 DIAGNOSIS — I2510 Atherosclerotic heart disease of native coronary artery without angina pectoris: Secondary | ICD-10-CM

## 2016-06-12 DIAGNOSIS — E7800 Pure hypercholesterolemia, unspecified: Secondary | ICD-10-CM

## 2016-06-12 MED ORDER — CARVEDILOL 6.25 MG PO TABS: 6.2500 mg | ORAL_TABLET | Freq: Two times a day (BID) | ORAL | 3 refills | Status: DC

## 2016-06-12 MED ORDER — AZILSARTAN-CHLORTHALIDONE 40-25 MG PO TABS: 1.0000 | ORAL_TABLET | Freq: Every day | ORAL | 3 refills | Status: DC

## 2016-06-12 MED ORDER — PANTOPRAZOLE SODIUM 40 MG PO TBEC: 40.0000 mg | DELAYED_RELEASE_TABLET | Freq: Every day | ORAL | 3 refills | Status: DC

## 2016-06-12 MED ORDER — ATORVASTATIN CALCIUM 40 MG PO TABS: 40.0000 mg | ORAL_TABLET | Freq: Every day | ORAL | 6 refills | Status: DC

## 2016-06-12 MED ORDER — ATORVASTATIN CALCIUM 40 MG PO TABS: 40.0000 mg | ORAL_TABLET | Freq: Every day | ORAL | 3 refills | Status: DC

## 2016-06-12 MED ORDER — SPIRONOLACTONE 25 MG PO TABS: 25.0000 mg | ORAL_TABLET | Freq: Every day | ORAL | 3 refills | Status: DC

## 2016-06-12 MED ORDER — SPIRONOLACTONE 25 MG PO TABS
25.0000 mg | ORAL_TABLET | Freq: Every day | ORAL | 3 refills | Status: DC
Start: 2016-06-12 — End: 2016-09-08

## 2016-06-12 NOTE — Progress Notes (Signed)
Cardiology Office Note   Date:  06/12/2016   ID:  Howard Maxwell, DOB 14-May-1945, MRN [id number]  PCP:  Tracie Harrier, MD  Cardiologist:   Kathlyn Sacramento, MD   Chief Complaint  Patient presents with  . other    1 month follow up. Meds reviewed by the pt. verbally. "doing well."       History of Present Illness: Howard Maxwell is a 71 y.o. male who presents for a followup visit regarding coronary artery disease, hypertension and supraventricular tachycardia.  He has known history of type 2 diabetes diagnosed in 2010, hypertension, tobacco use and obesity. He also has family history of coronary artery disease.  He presented in September 2015 with unstable angina.  Cardiac catheterization showed  an occluded mid RCA which was medium in size and codominant. Normal ejection fraction. He was treated medically.  He quit smoking since then. During cardiac rehabilitation, he had an episode of tachycardia likely SVT.   He was seen recently for uncontrolled hypertension. Part of this was felt to be due to excessive NSAID use related to lower back pain which seems to be chronic at this point. He followed with neurosurgery and was advised against surgery. During last visit, I switched him to [facility] with significant improvement in blood pressure since then. He denies any chest pain or shortness of breath. He continues to use ibuprofen and Aleve for lower back pain. He also has not been sleeping well at night due to his back pain.  Past Medical History:  Diagnosis Date  . Blind    one eye left  . Chronic back pain   . COPD (chronic obstructive pulmonary disease) ([place])   . Coronary artery disease    Cardiac catheterization in September of 2015 showed an occluded mid RCA which was medium in size and codominant. Normal ejection fraction.  . Diabetes mellitus without complication ([place])   . GERD (gastroesophageal reflux disease)   . Hypertension   . Sinus problem   . Tobacco use      Past Surgical History:  Procedure Laterality Date  . ANTERIOR CERVICAL DECOMP/DISCECTOMY FUSION    . BACK SURGERY    . CARDIAC CATHETERIZATION  12/07/2013   [facility]  . EYE SURGERY    . FEMUR FRACTURE SURGERY    . FOOT SURGERY Bilateral   . GALLBLADDER SURGERY    . NASAL RECONSTRUCTION       Current Outpatient Prescriptions  Medication Sig Dispense Refill  . atorvastatin (LIPITOR) 40 MG tablet Take 1 tablet (40 mg total) by mouth daily. 30 tablet 6  . Azilsartan-Chlorthalidone (EDARBYCLOR) 40-25 MG TABS Take 1 tablet by mouth daily. 30 tablet 3  . baclofen (LIORESAL) 10 MG tablet Take 0.5 tablets (5 mg total) by mouth 3 (three) times daily as needed for muscle spasms. 5 each 0  . carvedilol (COREG) 6.25 MG tablet Take 1 tablet (6.25 mg total) by mouth 2 (two) times daily. 180 tablet 3  . glipiZIDE (GLUCOTROL) 5 MG tablet Take 5 mg by mouth daily before breakfast.     . ibuprofen (ADVIL,MOTRIN) 800 MG tablet Take 1 tablet (800 mg total) by mouth 3 (three) times daily. (Patient taking differently: Take 800 mg by mouth as needed. ) 21 tablet 0  . metFORMIN (GLUCOPHAGE) 850 MG tablet Take 850 mg by mouth 2 (two) times daily with a meal.    . naphazoline (NAPHCON) 0.1 % ophthalmic solution Place 1 drop into both eyes as needed.     Marland Kitchen  pantoprazole (PROTONIX) 40 MG tablet Take 1 tablet (40 mg total) by mouth daily. 30 tablet 11  . Probiotic Product (PROBIOTIC DAILY PO) Take 1 tablet by mouth daily.     Marland Kitchen spironolactone (ALDACTONE) 25 MG tablet Take 1 tablet (25 mg total) by mouth daily. 30 tablet 3   No current facility-administered medications for this visit.     Allergies:   Codeine sulfate and Loratadine    Social History:  The patient  reports that he quit smoking about 2 years ago. His smoking use included Cigarettes. He smoked 1.00 pack per day. He has never used smokeless tobacco. He reports that he does not drink alcohol or use drugs.   Family History:  The patient's Family  history is unknown by patient.    ROS:  Please see the history of present illness.   Otherwise, review of systems are positive for none.   All other systems are reviewed and negative.    PHYSICAL EXAM: VS:  BP 140/82 (BP Location: Left Arm, Patient Position: Sitting, Cuff Size: Normal)   Pulse (!) 58   Ht '5\' 10"'$  (1.778 m)   Wt 252 lb (114.3 kg)   BMI 36.16 kg/m  , BMI Body mass index is 36.16 kg/m. GEN: Well nourished, well developed, in no acute distress  HEENT: normal  Neck: no JVD, carotid bruits, or masses Cardiac: RRR; no murmurs, rubs, or gallops,no edema  Respiratory:  clear to auscultation bilaterally, normal work of breathing GI: soft, nontender, nondistended, + BS MS: no deformity or atrophy  Skin: warm and dry, no rash Neuro:  Strength and sensation are intact Psych: euthymic mood, full affect   EKG:  EKG is ordered today. The ekg ordered today demonstrates normal sinus rhythm with first-degree AV block   Recent Labs: 04/15/2016: Hemoglobin 12.2; Platelets 226 05/22/2016: BUN 15; Creatinine, Ser 0.52; Potassium 4.5; Sodium 138    Lipid Panel    Component Value Date/Time   CHOL 88 (L) 01/16/2014 0805   TRIG 85 01/16/2014 0805   HDL 30 (L) 01/16/2014 0805   CHOLHDL 2.9 01/16/2014 0805   LDLCALC 41 01/16/2014 0805      Wt Readings from Last 3 Encounters:  06/12/16 252 lb (114.3 kg)  05/12/16 245 lb 8 oz (111.4 kg)  04/15/16 240 lb (108.9 kg)       ASSESSMENT AND PLAN:  1.  Coronary artery disease involving native coronary arteries without angina: He is overall doing well. Continue medical therapy. Continue low-dose aspirin.  2. Paroxysmal supraventricular tachycardia:  No recurrent arrhythmia.  3. Essential hypertension:  Blood pressure is now reasonably controlled on current medications. I suspect that NSAIDs use and poor quality sleep are both contributing to elevated blood pressure.  4. Hyperlipidemia: Continue treatment with atorvastatin 40 mg  daily. His LDL has been consistently below 70.  5. Chronic back pain: I asked him to discuss with his primary care physician about the possibility of using  different pain medications other than NSAIDs. Possibly tramadol.  Disposition:   FU with me in 6 months  Signed,  Kathlyn Sacramento, MD  06/12/2016 10:41 AM    [place]

## 2016-06-12 NOTE — Patient Instructions (Signed)
Medication Instructions:  Your physician recommends that you continue on your current medications as directed. Please refer to the Current Medication list given to you today.   Labwork: none  Testing/Procedures: none  Follow-Up: Your physician wants you to follow-up in: 6 months with Dr. Arida. You will receive a reminder letter in the mail two months in advance. If you don't receive a letter, please call our office to schedule the follow-up appointment.   Any Other Special Instructions Will Be Listed Below (If Applicable).     If you need a refill on your cardiac medications before your next appointment, please call your pharmacy.   

## 2016-07-17 DIAGNOSIS — Z6835 Body mass index (BMI) 35.0-35.9, adult: Secondary | ICD-10-CM | POA: Insufficient documentation

## 2016-07-17 DIAGNOSIS — M48061 Spinal stenosis, lumbar region without neurogenic claudication: Secondary | ICD-10-CM | POA: Insufficient documentation

## 2016-07-17 DIAGNOSIS — M51379 Other intervertebral disc degeneration, lumbosacral region without mention of lumbar back pain or lower extremity pain: Secondary | ICD-10-CM | POA: Insufficient documentation

## 2016-07-17 DIAGNOSIS — M5137 Other intervertebral disc degeneration, lumbosacral region: Secondary | ICD-10-CM | POA: Insufficient documentation

## 2016-07-17 DIAGNOSIS — M5136 Other intervertebral disc degeneration, lumbar region: Secondary | ICD-10-CM | POA: Insufficient documentation

## 2016-08-05 DIAGNOSIS — Z9889 Other specified postprocedural states: Secondary | ICD-10-CM | POA: Insufficient documentation

## 2016-08-05 DIAGNOSIS — R937 Abnormal findings on diagnostic imaging of other parts of musculoskeletal system: Secondary | ICD-10-CM | POA: Insufficient documentation

## 2016-08-05 DIAGNOSIS — F1190 Opioid use, unspecified, uncomplicated: Secondary | ICD-10-CM | POA: Insufficient documentation

## 2016-08-05 DIAGNOSIS — E119 Type 2 diabetes mellitus without complications: Secondary | ICD-10-CM | POA: Insufficient documentation

## 2016-08-05 DIAGNOSIS — Z79899 Other long term (current) drug therapy: Secondary | ICD-10-CM | POA: Insufficient documentation

## 2016-08-05 DIAGNOSIS — M961 Postlaminectomy syndrome, not elsewhere classified: Secondary | ICD-10-CM | POA: Insufficient documentation

## 2016-08-05 DIAGNOSIS — G894 Chronic pain syndrome: Secondary | ICD-10-CM | POA: Insufficient documentation

## 2016-08-05 NOTE — Progress Notes (Signed)
Patient's Name: Howard Maxwell  MRN: [id number]  Referring Provider: Tracie Harrier, MD  DOB: 22-Aug-1945  PCP: Tracie Harrier, MD  DOS: 08/06/2016  Note by: Kathlen Brunswick. Dossie Arbour, MD  Service setting: Ambulatory outpatient  Specialty: Interventional Pain Management  Location: [facility] (AMB) Pain Management Facility    Patient type: New Patient   Primary Reason(s) for Visit: Initial Patient Evaluation CC: Back Pain (low )  HPI  Howard Maxwell is a 71 y.o. year old, male patient, who comes today for an initial evaluation. 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); History of cervical spinal surgery (C3-4 and C6-7 ACDF); Discitis of lumbar region (L1-2); 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); Musculoskeletal pain; Osteoarthritis of lumbar spine; Chronic low back pain (Location of Primary Source of Pain) (Bilateral) (R>L); Chronic hip pain (Location of Secondary source of pain) (Bilateral) (R>L); Chronic neck pain (Location of Tertiary source of pain) (Bilateral) (L>R); Chronic knee pain (Left); 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, C5-6, C6-7; 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) (s/p ACDF); 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 His primarily concern today is the Back Pain (low )  Pain Assessment: Self-Reported Pain Score: 4 /10             Reported level is compatible with observation.       Pain Type: Chronic pain Pain Location: Back Pain Orientation: Lower, Right, Left Pain Descriptors / Indicators: Aching, Sharp, Stabbing, Shooting Pain Frequency: Constant  Onset and Duration: Sudden Cause of pain: Unknown Severity: Getting worse, NAS-11 at its worse: 8/10, NAS-11 at its best: 4/10, NAS-11 now: 5/10 and NAS-11 on the average: 7/10 Timing: Not influenced by the time of the day, During activity or exercise, After activity or exercise and After a period of immobility Aggravating Factors: Motion, Prolonged standing and Walking Alleviating Factors: Medications Associated Problems: Numbness, Tingling and Pain that wakes patient up Quality of Pain: Constant, Sharp and Throbbing Previous Examinations or Tests: MRI scan, Nerve block, X-rays, Nerve conduction test, Neurological evaluation, Orthoperdic evaluation and Chiropractic evaluation Previous Treatments: Chiropractic manipulations, Epidural steroid injections, Narcotic medications and Physical Therapy  The patient comes into the clinics today for the first time for a chronic pain management evaluation. According to the patient and the primary area of pain is that of the lower back with the right side being worst on the left. The patient indicates having had a prior lumbar surgery done by Dr. Ellene Route around June 2017, without any complications. He also indicates having had a nerve block in his back around the year 2000 I Dr. Nelva Bush. At the time he was experiencing left-sided low back pain which did go away with only one injection  and never came back. The pain that he experienced prior to the  surgery was in the opposite side. The patient also indicates having had physical therapy which consisted of 2-3 visits per week for up proximally 4 weeks. He indicates that this seemed to have helped to a certain degree. He also indicates having a recent MRI of the lumbar spine. This pain in the lower back seems to be referred towards the area of the hips. The patient's second worst area pain is that of the hips with the right being worst on the left. He denies any surgeries, nerve blocks, physical therapy, or x-rays/MRIs of the hips. The next area of pain is described to be that of the neck with the pain is in the posterior aspect of the neck affecting both sides but with the left side being worse than the right. He indicates having had one cervical spine surgery done by Dr. Louanne Skye, around the year 2000 without any complications. He also indicates having a series of 3 cervical epidural steroid injections done by Dr. Nelva Bush without any significant benefit Lumbar prior to the surgery. At the time he was experiencing neck pain and left hand which completely results after the surgical he indicates having try physical therapy before the surgery without any success. After the surgery he has been seen by a chiropractor and he also indicates having an MRI of the cervical spine that is less than 56 years old. His next area of pain is that of the left knee. He indicates that this is fairly new but has been there for longer than 3 months. He denies any prior surgeries, nerve blocks, joint injections, physical therapy, x-rays, or MRIs.  He indicates treating his pain with ibuprofen 600-800 mg 2-3 times a day. With this he can completely control his pain. Unfortunately, he was instructed by his cardiologist to discontinue the nonsteroidal anti-inflammatory drugs. Although he did not provide a reason for this, we're assuming that this is secondary to the  cardiovascular risk associated with the NSAIDS. Since he was unable to use the ibuprofen for his pain management, he was started on hydrocodone/APAP 5/325, 2 tablets per day. He refers that this does help, but the ibuprofen use to work better. He also indicates having tried tramadol which did not help. The patient indicates that Dr. Ellene Route told him that he should not have any steroid injections and he also indicated that another one of his physicians told him that using the steroids could lead to insulin-dependent diabetes. Currently the patient has2 non-insulin-dependent diabetes but he denies ever having been admitted to the hospital due to this diabetes. He has never experienced any diabetic ketoacidosis, hyperosmolar coma, or significant hyperglycemia. 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  During today's appointment, I went over his lab work and radiological studies and he did not seem to be familiar with the results of his latest MRI that indicated the possibility of discitis or osteomyelitis at the L1-2 level. The MRI suggested following up with CRP and/or sedimentation rate levels, but I did not find any such labs. I informed the patient that I will be ordering today a CBC with differential, sedimentation rate, and C-reactive protein, as well as a bone scan of the thoracolumbar spine looking for any possible evidence of discitis/osteomyelitis. If the patient does have a discitis and or osteomyelitis, this would be the reason why he was told not to have any steroid injections into the area of the spine.  Today  I took the time to provide the patient with information regarding my pain practice. The patient was informed that my practice is divided into two sections: an interventional pain management section, as well as a completely separate and distinct medication management section. I explained that I have procedure days for my  interventional therapies, and evaluation days for follow-ups and medication management. Because of the amount of documentation required during both, they are kept separated. This means that there is the possibility that he may be scheduled for a procedure on one day, and medication management the next. I have also informed him that because of staffing and facility limitations, I no longer take patients for medication management only. To illustrate the reasons for this, I gave the patient the example of surgeons, and how inappropriate it would be to refer a patient to his/her care, just to write for the post-surgical antibiotics on a surgery done by a different surgeon.   Because interventional pain management is my board-certified specialty, the patient was informed that joining my practice means that they are open to any and all interventional therapies. I made it clear that this does not mean that they will be forced to have any procedures done. What this means is that I believe interventional therapies to be essential part of the diagnosis and proper management of chronic pain conditions. Therefore, patients not interested in these interventional alternatives will be better served under the care of a different practitioner.  The patient was also made aware of my Comprehensive Pain Management Safety Guidelines where by joining my practice, they limit all of their nerve blocks and joint injections to those done by our practice, for as long as we are retained to manage their care.   Historic Controlled Substance Pharmacotherapy Review  PMP and historical list of controlled substances: Hydrocodone/APAP 5/325; tramadol 50 mg; diazepam 5 mg; oxycodone/APAP 5/325; Ambien 10 mg; hydrocodone/ibuprofen 7.5/200 diazepam 10 mg; hydrocodone cough syrup; hydrocodone/APAP 5/500 Highest opioid analgesic regimen found: Oxycodone/APAP 5/325 one tablet by mouth every 4 hours (30 mg/day of oxycodone) (50 MME/Day) Most  recent opioid analgesic: Hydrocodone/APAP 5/325 one tablet by mouth twice a day (filled on 07/17/2016) Current opioid analgesics: Hydrocodone/APAP 5/325 one tablet by mouth twice a day (filled on 07/17/2016) Highest recorded MME/day: 50 mg/day MME/day: 10 mg/day Medications: The patient did not bring the medication(s) to the appointment, as requested in our "New Patient Package" Pharmacodynamics: Desired effects: Analgesia: The patient reports >50% benefit. Reported improvement in function: The patient reports medication allows him to accomplish basic ADLs. Clinically meaningful improvement in function (CMIF): Sustained CMIF goals met Perceived effectiveness: Described as relatively effective, allowing for increase in activities of daily living (ADL) Undesirable effects: Side-effects or Adverse reactions: None reported Historical Monitoring: The patient  reports that he does not use drugs. List of all UDS Test(s): No results found for: MDMA, COCAINSCRNUR, PCPSCRNUR, PCPQUANT, CANNABQUANT, THCU, [place] List of all Serum Drug Screening Test(s):  No results found for: AMPHSCRSER, BARBSCRSER, BENZOSCRSER, COCAINSCRSER, PCPSCRSER, PCPQUANT, THCSCRSER, CANNABQUANT, OPIATESCRSER, OXYSCRSER, PROPOXSCRSER Historical Background Evaluation: [place] PDMP: Six (6) year initial data search conducted. Regular, uninterrupted pattern of monthly opioid refills detected.       [place] Department of public safety, offender search: Editor, commissioning Information) Non-contributory Risk Assessment Profile: Aberrant behavior: None observed or detected today Risk factors for fatal opioid overdose: None identified today Fatal overdose hazard ratio (HR): Calculation deferred Non-fatal overdose hazard ratio (HR): Calculation deferred Risk of opioid abuse or dependence: 0.7-3.0% with doses ? [place]  MME/day and 6.1-26% with doses ? 120 MME/day. Substance use disorder (SUD) risk level: Pending results of Medical Psychology Evaluation for  SUD Opioid risk tool (ORT) (Total Score): 6  ORT Scoring interpretation table:  Score <3 = Low Risk for SUD  Score between 4-7 = Moderate Risk for SUD  Score >8 = High Risk for Opioid Abuse   PHQ-2 Depression Scale:  Total score: 0  PHQ-2 Scoring interpretation table: (Score and probability of major depressive disorder)  Score 0 = No depression  Score 1 = 15.4% Probability  Score 2 = 21.1% Probability  Score 3 = 38.4% Probability  Score 4 = 45.5% Probability  Score 5 = 56.4% Probability  Score 6 = 78.6% Probability   PHQ-9 Depression Scale:  Total score: 0  PHQ-9 Scoring interpretation table:  Score 0-4 = No depression  Score 5-9 = Mild depression  Score 10-14 = Moderate depression  Score 15-19 = Moderately severe depression  Score 20-27 = Severe depression (2.4 times higher risk of SUD and 2.89 times higher risk of overuse)   Pharmacologic Plan: Pending ordered tests and/or consults  Meds  The patient has a current medication list which includes the following prescription(s): atorvastatin, azilsartan-chlorthalidone, carvedilol, vitamin d3, glipizide, glucose blood, hydrocodone-acetaminophen, ibuprofen, magnesium, metformin, methocarbamol, naphazoline, pantoprazole, potassium, and spironolactone.  Current Outpatient Prescriptions on File Prior to Visit  Medication Sig  . atorvastatin (LIPITOR) 40 MG tablet Take 1 tablet (40 mg total) by mouth daily.  . Azilsartan-Chlorthalidone (EDARBYCLOR) 40-25 MG TABS Take 1 tablet by mouth daily.  . carvedilol (COREG) 6.25 MG tablet Take 1 tablet (6.25 mg total) by mouth 2 (two) times daily.  Marland Kitchen glipiZIDE (GLUCOTROL) 5 MG tablet Take 5 mg by mouth daily before breakfast.   . ibuprofen (ADVIL,MOTRIN) 800 MG tablet Take 1 tablet (800 mg total) by mouth 3 (three) times daily. (Patient taking differently: Take 800 mg by mouth as needed. )  . metFORMIN (GLUCOPHAGE) 850 MG tablet Take 850 mg by mouth 2 (two) times daily with a meal.  .  naphazoline (NAPHCON) 0.1 % ophthalmic solution Place 1 drop into both eyes as needed.   . pantoprazole (PROTONIX) 40 MG tablet Take 1 tablet (40 mg total) by mouth daily.  Marland Kitchen spironolactone (ALDACTONE) 25 MG tablet Take 1 tablet (25 mg total) by mouth daily.   No current facility-administered medications on file prior to visit.    Imaging Review  Cervical Imaging: Cervical MR wo contrast:  Results for orders placed during the hospital encounter of 03/18/14  MR Cervical Spine Wo Contrast   Narrative CLINICAL DATA:  71 year old male with chronic left side neck pain radiating to the left upper extremity. Numbness and weakness in the arm and fingers. History of cervical spine surgery in 2002. Subsequent encounter.  EXAM: MRI CERVICAL SPINE WITHOUT CONTRAST  TECHNIQUE: Multiplanar, multisequence MR imaging of the cervical spine was performed. No intravenous contrast was administered.  COMPARISON:  [facility] cervical spine radiographs 08/18/2012.  FINDINGS: Mild hardware susceptibility artifact associated with ACDF hardware at the C3-C4 and C6-C7 levels, corresponding to the the radiographic appearance in 2014. Stable vertebral height and alignment. No marrow edema or evidence of acute osseous abnormality.  Cervicomedullary junction is within normal limits. No spinal cord signal abnormality identified. Negative paraspinal soft tissues.  C2-C3: Moderate to severe facet hypertrophy on the left with mild uncovertebral hypertrophy. Mild left C3 foraminal stenosis.  C3-C4: ACDF. Moderate facet and uncovertebral hypertrophy. No spinal stenosis. Moderate to severe left C4 foraminal  stenosis.  C4-C5: Multifactorial spinal stenosis related to circumferential disc bulge, ligament flavum hypertrophy, and moderate facet hypertrophy greater on the left. Spinal stenosis with mild spinal cord mass effect, no cord signal abnormality. Moderate to severe left and moderate right C5  foraminal stenosis.  C5-C6: Circumferential disc osteophyte complex and mild to moderate ligament flavum hypertrophy. Moderate facet hypertrophy. Left greater than right uncovertebral hypertrophy. Spinal stenosis with no spinal cord mass effect. Moderate to severe left and borderline to mild right C6 foraminal stenosis.  C6-C7: ACDF. Mild ligament flavum hypertrophy. Moderate facet hypertrophy. Bulky left greater than right uncovertebral hypertrophy. No spinal stenosis. Moderate to severe left and moderate right C7 foraminal stenosis.  C7-T1: Circumferential disc osteophyte complex. Mild to moderate ligament flavum hypertrophy. No spinal stenosis. Mild to moderate facet and uncovertebral hypertrophy. Mild bilateral C8 foraminal stenosis.  No upper thoracic spinal stenosis.  IMPRESSION: 1. Sequelae of ACDF at C3-C4 and C6-C7. Moderate to severe left side foraminal stenosis at both levels. 2. 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 Mild spinal cord mass effect at the former but no cord signal abnormality. Moderate to severe left greater than right C5 and left C6 neural foraminal stenosis.   Electronically Signed   By: Lars Pinks M.D.   On: 03/18/2014 10:07    Cervical CT wo contrast:  Results for orders placed in visit on 04/08/01  CT Cervical Spine Wo Contrast   Narrative FINDINGS CLINICAL DATA:  NECK PAIN; S/P ANTERIOR CERVICAL FUSION AT C3-4 AND C6-7 LEVELS CERVICAL SPINE CT WITHOUT CONTRAST 2.5 MM THICK AXIAL IMAGES WERE OBTAINED FROM THE UPPER C2 LEVEL THROUGH THE MID T2 LEVEL. MULTIPLANAR SAGITTAL AND CORONAL RECONSTRUCTION IMAGES WERE ALSO OBTAINED.  THESE ARE CORRELATED WITH THE C-ARM RADIOGRAPHS OBTAINED AT [facility] ON 12/15/00.  AGAIN DEMONSTRATED IS INTERBODY BONE PLUG AND ANTERIOR SCREW AND PLATE FUSION AT THE V7-8 AND C6-7 LEVELS.  NORMAL VERTEBRAL ALIGNMENT IS DEMONSTRATED.  ON THE SAGITTAL AND AND CORONAL  RECONSTRUCTION IMAGES, NO LUCENCY IS SEEN BETWEEN THE BONE PLUGS AND ADJACENT VERTEBRAL BODIES.  ON THE AXIAL IMAGES, LUCENCY IS DEMONSTRATED BETWEEN THE BONE PLUGS AND ADJACENT INTERVERTEBRAL BODIES LATERALLY AND POSTERIORLY.  AGAIN DEMONSTRATED IS MILD ANTERIOR SPUR FORMATION AT THE C4-5 AND C5-6 LEVELS.  NO SIGNIFICANT POSTERIOR SPUR FORMATION IS DEMONSTRATED. IMPRESSION STATUS POST ANTERIOR CERVICAL FUSION AT THE C3-4 AND C6-7 LEVELS AS DESCRIBED ABOVE.  ON THE SAGITTAL AND CORONAL RECONSTRUCTION IMAGES, THE BONE PLUGS APPEAR FUSED WITH THE ADJACENT VERTEBRAL BODIES SUPERIORLY AND INFERIORLY.  ON THE AXIAL IMAGES, THERE ARE PORTIONS OF THE LATERAL AND POSTERIOR ASPECTS OF THE BONE PLUGS WHICH ARE NOT FUSED WITH THE ADJACENT BONE. MULTIPLANAR CT RECONSTRUCTION PLEASE SEE THE ABOVE REPORT. IMPRESSION SEE THE ABOVE REPORT.   Cervical CT w contrast:  Results for orders placed in visit on 11/01/00  CT Cervical Spine W Contrast   Narrative FINDINGS CERVICAL MYELOGRAM & POST-MYELOGRAM CT: HISTORY:  LT. ARM & SHOULDER PAIN. MYELOGRAM: LUMBAR PUNCTURE WAS PERFORMED BY MYSELF AT L4-5 USING A 22 GAUGE SPINAL NEEDLE.  FLUID WAS CLEAR AND COLORLESS.  8 CC OMNIPAQUE 300 WAS INSTILLED IN THE SUBARACHNOID SPACE AND MANEUVERED INTO THE CERVICAL REGION.  AP, LATERAL AND OBLIQUE VIEWS DEMONSTRATE AN EXTRADURAL DEFECT AT C6-7 ON THE LEFT.  A MORE SUBTLE DEFECT IS SEEN AT C3-4 ON THE LEFT. IMPRESSION EXTRADURAL DEFECT AT C6-7, LEFT, WITH MORE SUBTLE DEFECT SEEN ALSO AT C3-4 ON THE LEFT. POST-MYELOGRAM CT: DISC SPACES WERE EXAMINED AS  FOLLOWS: C3-4:  LARGE UNCINATE SPUR, LEFT.  LEFT C-4 NERVE ROOT ENCROACHMENT IS PRESENT. C4-5:  NORMAL INTERSPACE. C5-6:  NORMAL INTERSPACE. C6-7:  HARD AND SOFT DISC CENTRAL AND TO THE LEFT.  LEFT C-7 NERVE ROOT ENCROACHMENT IS PRESENT. C7-T1:  NORMAL INTERSPACE. IMPRESSION: 1.  HARD AND SOFT DISC AT C6-7 CENTRAL AND TO THE LEFT WITH LEFT C-7 NERVE ROOT  ENCROACHMENT. 2.  MODERATE-SIZED UNCINATE SPUR AT C3-4, LEFT, WITH LEFT C-4 NERVE ROOT ENCROACHMENT.   Cervical DG 1 view:  Results for orders placed in visit on 12/15/00  DG Cervical Spine 1 View   Narrative FINDINGS  CLINICAL DATA:  HERNIATED DISC AT C3-4 C-ARM FLUOROSCOPY: C-ARM FLUOROSCOPY WAS USED IN THE OPERATING ROOM FOR 30-60 MINUTES. IMPRESSION C-ARM FLUOROSCOPY. CERVICAL SPINE:FOUR LATERAL IMAGES OF THE CERVICAL SPINE WERE SUBMITTED FOR INTERPRETATION.  ON THE INITIAL IMAGES, THE ANTERIOR ORTHOPEDIC LOCALIZERS ARE AT THE C3-4 AND C5-6 LEVELS.  ON THE SECOND IMAGE, THE LOCALIZERS ARE AT THE C5-6 AND C6-7 LEVELS.  THE THIRD AND FOURTH IMAGES REVEAL ANTERIOR ORTHOPEDIC FIXATION OF C3 TO C4 AND C6 TO C7. IMPRESSION:INTRAOPERATIVE LATERAL CERVICAL SPINE IMAGES.   Cervical DG Myelogram views:  Results for orders placed in visit on 11/01/00  DG Myelogram Cervical   Narrative FINDINGS CERVICAL MYELOGRAM & POST-MYELOGRAM CT: HISTORY:  LT. ARM & SHOULDER PAIN. MYELOGRAM: LUMBAR PUNCTURE WAS PERFORMED BY MYSELF AT L4-5 USING A 22 GAUGE SPINAL NEEDLE.  FLUID WAS CLEAR AND COLORLESS.  8 CC OMNIPAQUE 300 WAS INSTILLED IN THE SUBARACHNOID SPACE AND MANEUVERED INTO THE CERVICAL REGION.  AP, LATERAL AND OBLIQUE VIEWS DEMONSTRATE AN EXTRADURAL DEFECT AT C6-7 ON THE LEFT.  A MORE SUBTLE DEFECT IS SEEN AT C3-4 ON THE LEFT. IMPRESSION EXTRADURAL DEFECT AT C6-7, LEFT, WITH MORE SUBTLE DEFECT SEEN ALSO AT C3-4 ON THE LEFT. POST-MYELOGRAM CT: DISC SPACES WERE EXAMINED AS FOLLOWS: C3-4:  LARGE UNCINATE SPUR, LEFT.  LEFT C-4 NERVE ROOT ENCROACHMENT IS PRESENT. C4-5:  NORMAL INTERSPACE. C5-6:  NORMAL INTERSPACE. C6-7:  HARD AND SOFT DISC CENTRAL AND TO THE LEFT.  LEFT C-7 NERVE ROOT ENCROACHMENT IS PRESENT. C7-T1:  NORMAL INTERSPACE. IMPRESSION: 1.  HARD AND SOFT DISC AT C6-7 CENTRAL AND TO THE LEFT WITH LEFT C-7 NERVE ROOT ENCROACHMENT. 2.  MODERATE-SIZED UNCINATE SPUR AT C3-4, LEFT, WITH  LEFT C-4 NERVE ROOT ENCROACHMENT.   Lumbosacral Imaging: Lumbar MR wo contrast:  Results for orders placed during the hospital encounter of 06/09/15  MR Lumbar Spine Wo Contrast   Narrative CLINICAL DATA:  Low back pain with right hip and leg pain for 18 months.  EXAM: MRI LUMBAR SPINE WITHOUT CONTRAST  TECHNIQUE: Multiplanar, multisequence MR imaging of the lumbar spine was performed. No intravenous contrast was administered.  COMPARISON:  03/07/2014  FINDINGS: The lowest lumbar type non-rib-bearing vertebra is labeled as L5. The conus medullaris appears normal. Conus level: L1-2.  Dextroconvex lumbar scoliosis. Intervertebral disc desiccation is observed at all levels in the lumbar spine with type 1 degenerative endplate findings posteriorly at the L1-2 level.  Congenitally short pedicles in the lumbar spine with mild prominence of lumbar epidural adipose tissues.  No vertebral subluxation is observed. Additional findings at individual levels are as follows:  L1-2: Moderate central narrowing of the thecal sac with mild left foraminal stenosis due to disc bulge and facet arthropathy.  L2-3: Moderate to prominent central narrowing of the thecal sac with mild bilateral subarticular lateral recess stenosis due to diffuse disc bulge, short pedicles, and facet arthropathy.  L3-4: Moderate  central narrowing of the thecal sac with mild bilateral foraminal stenosis and mild right subarticular lateral recess stenosis due to disc bulge, short pedicles, and facet arthropathy.  L4-5: Prominent central narrowing of the thecal sac with mild bilateral foraminal stenosis, mild displacement of the left L4 nerve in the lateral extraforaminal space, and mild bilateral subarticular lateral recess stenosis due to disc bulge, short pedicles, central disc protrusion, and facet arthropathy.  L5-S1:  No impingement.  Bilateral facet arthropathy.  IMPRESSION: 1. Lumbar spondylosis,  congenitally short pedicles, and degenerative disc disease, causing prominent impingement at L4-5; moderate to prominent impingement at L2- 3; and moderate impingement at L1- 2 and L3-4, as detailed above. 2. Mild dextroconvex lumbar scoliosis.   Electronically Signed   By: Van Clines M.D.   On: 06/09/2015 14:32    Lumbar MR w/wo contrast:  Results for orders placed during the hospital encounter of 06/02/16  MR Lumbar Spine W Wo Contrast   Narrative CLINICAL DATA:  Three-month acute on chronic constant low back pain radiating to RIGHT posterior flank. Status post lumbar spine surgery June 2017. Assess neurogenic claudication.  EXAM: MRI LUMBAR SPINE WITHOUT AND WITH CONTRAST  TECHNIQUE: Multiplanar and multiecho pulse sequences of the lumbar spine were obtained without and with intravenous contrast.  CONTRAST:  42m MULTIHANCE GADOBENATE DIMEGLUMINE 529 MG/ML IV SOLN  COMPARISON:  MRI of the lumbar spine June 09, 2015  FINDINGS: SEGMENTATION: For the purposes of this report, the last well-formed intervertebral disc will be described as L5-S1.  ALIGNMENT: Maintenance of the lumbar lordosis. No malalignment.  VERTEBRAE:Vertebral bodies are intact. Status post L4-5 laminectomies. Severe new L1-2 disc height loss with extensive irregular bone marrow edema and enhancement. Associated new bright STIR signal, and heterogeneously enhancing 17 mm soft tissue component extending into the prevertebral and LEFT paraspinal soft tissues deep to the iliopsoas muscle. Similar moderate to severe L4-5 disc height loss. Decreased T2 signal within the lumbar disc compatible with desiccation with similar L2-3 through L5-S1 mild subacute on chronic discogenic endplate changes.  CONUS MEDULLARIS: Conus medullaris terminates at L1-2 and demonstrates normal morphology and signal characteristics. Pops duration of the cauda equina L1 is similar with faint epidural enhancement from L1-2  at L2 predominately dorsal. No abnormal leptomeningeal enhancement or nerve root clumping.  PARASPINAL AND SOFT TISSUES: RIGHT greater than LEFT paraspinal muscle denervation at and below the level surgical intervention.  DISC LEVELS:  L1-2: Similar broad-based disc bulge with new large LEFT subarticular to extraforaminal disc protrusion displacing the exited LEFT L1 nerve. Enhancing LEFT central annular fissure. Mild canal stenosis, lateral recess stenosis may affect the traversing RIGHT L2 nerve. Mild RIGHT, moderate LEFT neural foraminal narrowing.  L2-3: 4 mm similar broad-based disc bulge, moderate RIGHT and mild LEFT facet arthropathy and ligamentum flavum redundancy. Stable moderate canal stenosis with thecal sac effacement. Mild LEFT neural foraminal narrowing.  L3-4: Similar 4 mm broad-based disc bulge. New small RIGHT central disc protrusion. Moderate facet arthropathy and ligamentum flavum redundancy, 5 mm RIGHT facet synovial cyst extending the paraspinal soft tissues. New 4 mm migrated LEFT facet synovial cyst extending into the dorsal epidural space. Moderate canal stenosis. Mild to moderate bilateral neural foraminal narrowing. 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  L4-5: Laminectomies. Similar 5 mm broad-based disc bulge and mild canal stenosis. Moderate facet arthropathy. Moderate RIGHT greater than LEFT neural foraminal narrowing.  L5-S1: Small broad-based disc bulge. Severe bilateral facet arthropathy. No canal stenosis. Similar 7 mm RIGHT  facet synovial cyst extending into the superior aspect of the neural foramen. No neural foraminal narrowing.  IMPRESSION: New large LEFT L1-2 disc protrusion, though given large prevertebral component at this level, superimposed discitis osteomyelitis is possible. Recommend correlation with ESR and CRP.  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  Similar moderate canal stenosis L2-3 and L3-4. Neural foraminal narrowing at all lumbar levels: Moderate at L1-2 and L4-5.   Electronically Signed   By: Elon Alas M.D.   On: 06/03/2016 04:11    Note: Available results from prior imaging studies were reviewed.        ROS  Cardiovascular History: Heart trouble, Hypertension, Heart attack ( Date: 11/2013) and Heart catheterization Pulmonary or Respiratory History: Smoker Neurological History: Negative for epilepsy, stroke, urinary or fecal inontinence, spina bifida or tethered cord syndrome Review of Past Neurological Studies: No results found for this or any previous visit. Psychological-Psychiatric History: Negative for anxiety, depression, schizophrenia, bipolar disorders or suicidal ideations or attempts Gastrointestinal History: Reflux or heatburn Genitourinary History: Negative for nephrolithiasis, hematuria, renal failure or chronic kidney disease Hematological History: Negative for anticoagulant therapy, anemia, bruising or bleeding easily, hemophilia, sickle cell disease or trait, thrombocytopenia or coagulupathies Endocrine History: Non-insulin-dependent diabetes mellitus Rheumatologic History: Negative for lupus, osteoarthritis, rheumatoid arthritis, myositis, polymyositis or fibromyagia Musculoskeletal History: Negative for myasthenia gravis, muscular dystrophy, multiple sclerosis or malignant hyperthermia Work History: Retired  Allergies  Mr. Walberg is allergic to codeine sulfate and loratadine.  Laboratory Chemistry  Inflammation Markers Lab Results  Component Value Date   ESRSEDRATE 10 08/06/2016   (CRP: Acute Phase) (ESR: Chronic Phase) Renal Function Markers Lab Results  Component Value Date   BUN 17 08/06/2016   CREATININE 0.87 08/06/2016   GFRAA >60 08/06/2016   GFRNONAA >60 08/06/2016   Hepatic Function Markers Lab Results  Component Value Date   AST 28 08/06/2016   ALT  30 08/06/2016   ALBUMIN 4.2 08/06/2016   ALKPHOS 71 08/06/2016   Electrolytes Lab Results  Component Value Date   NA 133 (L) 08/06/2016   K 4.0 08/06/2016   CL 96 (L) 08/06/2016   CALCIUM 9.1 08/06/2016   MG 1.7 08/06/2016   Neuropathy Markers No results found for: [id number] Bone Pathology Markers Lab Results  Component Value Date   ALKPHOS 71 08/06/2016   CALCIUM 9.1 08/06/2016   Coagulation Parameters Lab Results  Component Value Date   INR 1.0 01/12/2014   LABPROT 13.3 01/12/2014   APTT 33.9 01/12/2014   PLT 275 08/06/2016   Cardiovascular Markers Lab Results  Component Value Date   BNP 319 (H) 01/12/2014   HGB 13.4 08/06/2016   HCT 40.2 08/06/2016   Note: Lab results reviewed.  [place]  Drug: Mr. Cleavenger  reports that he does not use drugs. Alcohol:  reports that he does not drink alcohol. Tobacco:  reports that he quit smoking about 2 years ago. His smoking use included Cigarettes. He smoked 1.00 pack per day. He has never used smokeless tobacco. Medical:  has a past medical history of Blind; Chronic back pain; COPD (chronic obstructive pulmonary disease) ([place]); Coronary artery disease; Diabetes mellitus without complication ([place]); GERD (gastroesophageal reflux disease); Hypertension; Sinus problem; and Tobacco use. Family: Family history is unknown by patient.  Past Surgical History:  Procedure Laterality Date  . ANTERIOR CERVICAL DECOMP/DISCECTOMY FUSION    . BACK SURGERY    . CARDIAC CATHETERIZATION  12/07/2013   [facility]  . EYE SURGERY    .  FEMUR FRACTURE SURGERY    . FOOT SURGERY Bilateral   . GALLBLADDER SURGERY    . NASAL RECONSTRUCTION     Active Ambulatory Problems    Diagnosis Date Noted  . Hypertension   . Tobacco use   . Unstable angina ([place]) 12/06/2013  . Coronary artery disease   . Hyperlipidemia 12/14/2013  . PSVT (paroxysmal supraventricular tachycardia) ([place]) 01/15/2014  . Diabetes mellitus type 2, uncomplicated ([place]) 07/37/1062  . BMI  35.0-35.9,adult 07/17/2016  . Lumbar DDD (degenerative disc disease) 07/17/2016  . Lumbar central spinal stenosis (L2-3 and L3-4) 07/17/2016  . Chronic pain syndrome 08/05/2016  . Opiate use 08/05/2016  . Long-term use of high-risk medication 08/05/2016  . Abnormal MRI, lumbar spine 08/05/2016  . Abnormal MRI, cervical spine 08/05/2016  . 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 History of cervical spinal surgery (C3-4 and C6-7 ACDF) 08/05/2016  . Discitis of lumbar region (L1-2) 08/06/2016  . 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 Musculoskeletal pain 08/06/2016  . Osteoarthritis of lumbar spine 08/06/2016  . Chronic low back pain (Location of Primary Source of Pain) (Bilateral) (R>L) 08/06/2016  . Chronic hip pain (Location of Secondary source of pain) (Bilateral) (R>L) 08/06/2016  . Chronic neck pain (Location of Tertiary source of pain) (Bilateral) (L>R) 08/06/2016  . Chronic knee pain (Left) 08/06/2016  . 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) 08/06/2016  . Tricompartmental disease of knee (Left) 08/06/2016  . 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) 08/06/2016  . GERD (gastroesophageal reflux disease) 08/06/2016  . Vitamin D insufficiency 08/06/2016  . Long term (current) use of opiate analgesic 08/06/2016  . Long term prescription opiate use 08/06/2016  . Lumbar facet syndrome (Bilateral) (R>L) 08/06/2016  . Lumbar facet arthropathy (HCC) (Bilateral & Multilevel) (L2-3 to L5-S1) 08/06/2016  . Lumbar facet joint synovial cysts (Right: L3-4 & L5-S1 & Left: L3-4) 08/06/2016   Resolved Ambulatory Problems    Diagnosis Date Noted  . No Resolved  Ambulatory Problems   Past Medical History:  Diagnosis Date  . Blind   . Chronic back pain   . COPD (chronic obstructive pulmonary disease) ([place])   . Coronary artery disease   . Diabetes mellitus without complication ([place])   . GERD (gastroesophageal reflux disease)   . Hypertension   . Sinus problem   . Tobacco use    Constitutional Exam  General appearance: Well nourished, well developed, and well hydrated. In no apparent acute distress Vitals:   08/06/16 1136  BP: 134/75  Pulse: 65  Resp: 16  Temp: 98.4 F (36.9 C)  SpO2: 98%  Weight: 246 lb (111.6 kg)  Height: 5' 10"  (1.778 m)   BMI Assessment: Estimated body mass index is 35.3 kg/m as calculated from the following:   Height as of this encounter: 5' 10"  (1.778 m).   Weight as of this encounter: 246 lb (111.6 kg).  BMI interpretation table: BMI level Category Range association with higher incidence of chronic pain  <18 kg/m2 Underweight   18.5-24.9 kg/m2 Ideal body weight   25-29.9 kg/m2 Overweight Increased incidence by 20%  30-34.9 kg/m2 Obese (Class I) Increased incidence by 68%  35-39.9 kg/m2 Severe obesity (Class II) Increased incidence by 136%  >40 kg/m2 Extreme obesity (Class  III) Increased incidence by 254%   BMI Readings from Last 4 Encounters:  08/06/16 35.30 kg/m  06/12/16 36.16 kg/m  05/12/16 35.23 kg/m  04/15/16 34.44 kg/m   Wt Readings from Last 4 Encounters:  08/06/16 246 lb (111.6 kg)  06/12/16 252 lb (114.3 kg)  05/12/16 245 lb 8 oz (111.4 kg)  04/15/16 240 lb (108.9 kg)  Psych/Mental status: Alert, oriented x 3 (person, place, & time)       Eyes: PERLA. 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 Respiratory: No evidence of acute respiratory distress  Cervical Spine Exam  Inspection: Well healed scar from previous spine surgery detected Alignment: Symmetrical Functional ROM: Diminished ROM      Stability: No instability detected Muscle strength & Tone: Functionally intact Sensory: Movement-associated  discomfort Palpation: No palpable anomalies              Upper Extremity (UE) Exam    Side: Right upper extremity  Side: Left upper extremity  Inspection: No masses, redness, swelling, or asymmetry. No contractures  Inspection: No masses, redness, swelling, or asymmetry. No contractures  Functional ROM: Unrestricted ROM          Functional ROM: Unrestricted ROM          Muscle strength & Tone: Functionally intact  Muscle strength & Tone: Functionally intact  Sensory: Unimpaired  Sensory: Unimpaired  Palpation: No palpable anomalies              Palpation: No palpable anomalies              Specialized Test(s): Deferred         Specialized Test(s): Deferred          Thoracic Spine Exam  Inspection: No masses, redness, or swelling Alignment: Symmetrical Functional ROM: Unrestricted ROM Stability: No instability detected Sensory: Unimpaired Muscle strength & Tone: No palpable anomalies  Lumbar Spine Exam  Inspection: Well healed scar from previous spine surgery detected Alignment: Symmetrical Functional ROM: Decreased ROM      Stability: No instability detected Muscle strength & Tone: Functionally intact Sensory: Movement-associated pain Palpation: Complains of area being tender to palpation       Provocative Tests: Lumbar Hyperextension and rotation test: Positive bilaterally for facet joint pain. Patrick's Maneuver: evaluation deferred today                    Gait & Posture Assessment  Ambulation: Unassisted Gait: Antalgic Posture: WNL   Lower Extremity Exam    Side: Right lower extremity  Side: Left lower extremity  Inspection: No masses, redness, swelling, or asymmetry. No contractures  Inspection: No masses, redness, swelling, or asymmetry. No contractures  Functional ROM: Diminished ROM for hip joint  Functional ROM: Diminished ROM for hip and knee joints  Muscle strength & Tone: Functionally intact  Muscle strength & Tone: Functionally intact  Sensory: Unimpaired   Sensory: Unimpaired  Palpation: No palpable anomalies  Palpation: No palpable anomalies   Assessment  Primary Diagnosis & Pertinent Problem List: The primary encounter diagnosis was Chronic pain syndrome. 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), 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), Lumbar facet arthropathy (HCC) (multilevel), Lumbar facet joint synovial cysts (Right: L3-4 & L5-S1 & Left: L3-4), Lumbar facet syndrome (  Bilateral) (R>L), Abnormal MRI, lumbar spine, 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), Chronic hip pain (Location of Secondary source of pain) (Bilateral) (R>L), 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), Chronic neck pain (Location of Tertiary source of pain) (Bilateral) (L>R), History of cervical spinal surgery (C3-4 and C6-7 ACDF), Abnormal MRI, cervical spine, 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, C5-6, C6-7; 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) (s/p ACDF), Chronic knee pain (Left), 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  Visit Diagnosis: 1. Chronic pain syndrome   2. Chronic low back pain (Location of Primary Source of Pain)  (Bilateral) (R>L)   3. 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 Discitis of lumbar region (L1-2)   5. Osteoarthritis of lumbar spine   6. Lumbar DDD (degenerative disc disease)   7. 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 Lumbar facet arthropathy (HCC) (multilevel)   12. Lumbar facet joint synovial cysts (Right: L3-4 & L5-S1 & Left: L3-4)   13. Lumbar facet syndrome (Bilateral) (R>L)   14. Abnormal MRI, lumbar spine   15. 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 Chronic hip pain (Location of Secondary source of pain) (Bilateral) (R>L)   17. 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 Chronic neck pain (Location of Tertiary source of pain) (Bilateral) (L>R)   19. History of cervical spinal surgery (C3-4 and C6-7 ACDF)   20. Abnormal MRI, cervical spine   21. 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, C5-6, C6-7; 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) (s/p ACDF)   22. Chronic knee pain (Left)   23. 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 Tricompartmental disease of knee (Left)   25. Musculoskeletal pain   26. Long-term use of high-risk medication   27. Long term (current) use of opiate analgesic   28. Long term prescription opiate use   29. Opiate use   30. Type 2 diabetes mellitus without complication, without long-term current use of insulin (HCC)   31. Essential hypertension   32. Gastroesophageal reflux disease without esophagitis   33. PSVT (paroxysmal supraventricular tachycardia) ([place])   34. Pure hypercholesterolemia   35. Vitamin D insufficiency    Plan of Care  Initial treatment plan:  Please be advised that as per protocol, today's visit has been an evaluation only. We have not taken over the patient's controlled substance management.  Problem-specific plan: No  problem-specific Assessment & Plan notes found for this encounter.  Ordered Lab-work, Procedure(s), Referral(s), & Consult(s): Orders Placed This Encounter  Procedures  . NM Bone Scan Limited  . DG Lumbar Spine Complete W/Bend  . DG HIP UNILAT W OR W/O PELVIS 2-3 VIEWS LEFT  . DG HIP UNILAT W OR W/O PELVIS 2-3 VIEWS RIGHT  . DG Knee 1-2 Views Left  . DG Cervical Spine Complete  . Compliance Drug Analysis, Ur  . Comprehensive metabolic panel  . C-reactive protein  . Magnesium  . Sedimentation rate  . Vitamin B12  . 25-Hydroxyvitamin D Lcms D2+D3  .  CBC with Differential/Platelet  . Ambulatory referral to Psychology   Pharmacotherapy: Medications ordered:  No orders of the defined types were placed in this encounter.  Medications administered during this visit: Mr. Dargis had no medications administered during this visit.   Pharmacotherapy under consideration:  Opioid Analgesics: The patient was informed that there is no guarantee that he would be a candidate for opioid analgesics. The decision will be made following CDC guidelines. This decision will be based on the results of diagnostic studies, as well as Mr. Shubert's risk profile.  Membrane stabilizer: To be determined at a later time Muscle relaxant: To be determined at a later time NSAID: Despite the effectiveness of NSAIDs, the patient has a history of cardiovascular problems, SVT, and GERD. Because of this, his cardiologist recommended to stay away from them. Other analgesic(s): the patient indicates having unsuccessfully attempted the use of tramadol.   Interventional therapies under consideration: Mr. Dobie was informed that there is no guarantee that he would be a candidate for interventional therapies. The decision will be based on the results of diagnostic studies, as well as Mr. Montemayor's risk profile.  Possible procedure(s): The first step is to identify to identify whether or not the patient is having  discitis and/or osteomyelitis.  Diagnostic bilateral lumbar facet block  Possible bilateral lumbar facet RFA  Diagnostic L3-4 interspinous ligament block  Possible L3-4 bilateral medial branch RFA  Diagnostic right sided L2-3 and L3-4 lumbar epidural steroid injection  Diagnostic bilateral L1-2 & L4-5 transforaminal epidural steroid injection  Diagnostic caudal epidural steroid injection + diagnostic epidurogram  Possible Racz procedure  Diagnostic bilateral intra-articular hip joint injection  Diagnostic bilateral femoral nerve and obturator nerve block  Possible bilateral femoral nerve and obturator nerve RFA  Diagnostic left cervical epidural steroid injection  Diagnostic bilateral cervical facet block  Possible bilateral cervical facet RFA  Diagnostic left intra-articular knee injection with local anesthetic and steroid  Possible series of 5 left intra-articular Hyalgan knee injection  Diagnostic left Genicular nerve block  Possible left Genicular nerve radiofrequency ablation  Possible intrathecal opioid trial    Provider-requested follow-up: Return for 2nd Visit, after [facility] eval.  No future appointments.  Primary Care Physician: Tracie Harrier, MD Location: [facility] Note by: Kathlen Brunswick. Dossie Arbour, M.D, DABA, DABAPM, DABPM, DABIPP, FIPP Date: 08/06/2016; Time: 5:22 PM  Patient instructions provided during this appointment: Patient Instructions   Pain Score  Introduction: The pain score used by this practice is the Verbal Numerical Rating Scale (VNRS-11). This is an 11-point scale. It is for adults and children 10 years or older. There are significant differences in how the pain score is reported, used, and applied. Forget everything you learned in the past and learn this scoring system.  General Information: The scale should reflect your current level of pain. Unless you are specifically asked for the level of your worst pain, or your  average pain. If you are asked for one of these two, then it should be understood that it is over the past 24 hours.  Basic Activities of Daily Living (ADL): Personal hygiene, dressing, eating, transferring, and using restroom.  Instructions: Most patients tend to report their level of pain as a combination of two factors, their physical pain and their psychosocial pain. This last one is also known as "suffering" and it is reflection of how physical pain affects you socially and psychologically. From now on, report them separately. From this point on, when asked to report your pain  level, report only your physical pain. Use the following table for reference.  Pain Clinic Pain Levels (0-5/10)  Pain Level Score Description  No Pain 0   Mild pain 1 Nagging, annoying, but does not interfere with basic activities of daily living (ADL). Patients are able to eat, bathe, get dressed, toileting (being able to get on and off the toilet and perform personal hygiene functions), transfer (move in and out of bed or a chair without assistance), and maintain continence (able to control bladder and bowel functions). Blood pressure and heart rate are unaffected. A normal heart rate for a healthy adult ranges from 60 to 100 bpm (beats per minute).   Mild to moderate pain 2 Noticeable and distracting. Impossible to hide from other people. More frequent flare-ups. Still possible to adapt and function close to normal. It can be very annoying and may have occasional stronger flare-ups. With discipline, patients may get used to it and adapt.   Moderate pain 3 Interferes significantly with activities of daily living (ADL). It becomes difficult to feed, bathe, get dressed, get on and off the toilet or to perform personal hygiene functions. Difficult to get in and out of bed or a chair without assistance. Very distracting. With effort, it can be ignored when deeply involved in activities.   Moderately severe pain 4 Impossible  to ignore for more than a few minutes. With effort, patients may still be able to manage work or participate in some social activities. Very difficult to concentrate. Signs of autonomic nervous system discharge are evident: dilated pupils (mydriasis); mild sweating (diaphoresis); sleep interference. Heart rate becomes elevated (>115 bpm). Diastolic blood pressure (lower number) rises above 100 mmHg. Patients find relief in laying down and not moving.   Severe pain 5 Intense and extremely unpleasant. Associated with frowning face and frequent crying. Pain overwhelms the senses.  Ability to do any activity or maintain social relationships becomes significantly limited. Conversation becomes difficult. Pacing back and forth is common, as getting into a comfortable position is nearly impossible. Pain wakes you up from deep sleep. Physical signs will be obvious: pupillary dilation; increased sweating; goosebumps; brisk reflexes; cold, clammy hands and feet; nausea, vomiting or dry heaves; loss of appetite; significant sleep disturbance with inability to fall asleep or to remain asleep. When persistent, significant weight loss is observed due to the complete loss of appetite and sleep deprivation.  Blood pressure and heart rate becomes significantly elevated. Caution: If elevated blood pressure triggers a pounding headache associated with blurred vision, then the patient should immediately seek attention at an urgent or emergency care unit, as these may be signs of an impending stroke.    Emergency Department Pain Levels (6-10/10)  Emergency Room Pain 6 Severely limiting. Requires emergency care and should not be seen or managed at an outpatient pain management facility. Communication becomes difficult and requires great effort. Assistance to reach the emergency department may be required. Facial flushing and profuse sweating along with potentially dangerous increases in heart rate and blood pressure will be  evident.   Distressing pain 7 Self-care is very difficult. Assistance is required to transport, or use restroom. Assistance to reach the emergency department will be required. Tasks requiring coordination, such as bathing and getting dressed become very difficult.   Disabling pain 8 Self-care is no longer possible. At this level, pain is disabling. The individual is unable to do even the most "basic" activities such as walking, eating, bathing, dressing, transferring to a bed, or toileting. Fine  motor skills are lost. It is difficult to think clearly.   Incapacitating pain 9 Pain becomes incapacitating. Thought processing is no longer possible. Difficult to remember your own name. Control of movement and coordination are lost.   The worst pain imaginable 10 At this level, most patients pass out from pain. When this level is reached, collapse of the autonomic nervous system occurs, leading to a sudden drop in blood pressure and heart rate. This in turn results in a temporary and dramatic drop in blood flow to the brain, leading to a loss of consciousness. Fainting is one of the body's self defense mechanisms. Passing out puts the brain in a calmed state and causes it to shut down for a while, in order to begin the healing process.    Summary: 1. Refer to this scale when providing [place] with your pain level. 2. Be accurate and careful when reporting your pain level. This will help with your care. 3. Over-reporting your pain level will lead to loss of credibility. 4. Even a level of 1/10 means that there is pain and will be treated at our facility. 5. High, inaccurate reporting will be documented as "Symptom Exaggeration", leading to loss of credibility and suspicions of possible secondary gains such as obtaining more narcotics, or wanting to appear disabled, for fraudulent reasons. 6. Only pain levels of 5 or below will be seen at our facility. 7. Pain levels of 6 and above will be sent to the Emergency  Department and the appointment cancelled.  Please get your labs and x-rays done as soon as possible. _____________________________________________________________________________________________

## 2016-08-06 ENCOUNTER — Other Ambulatory Visit
Admission: RE | Admit: 2016-08-06 | Discharge: 2016-08-06 | Disposition: A | Discharge status: Home / Self Care | Payer: Medicare Other | Source: Ambulatory Visit | Attending: Pain Medicine | Admitting: Pain Medicine

## 2016-08-06 ENCOUNTER — Ambulatory Visit
Admission: RE | Admit: 2016-08-06 | Discharge: 2016-08-06 | Disposition: A | Discharge status: Home / Self Care | Payer: Medicare Other | Source: Ambulatory Visit | Attending: Pain Medicine | Admitting: Pain Medicine

## 2016-08-06 ENCOUNTER — Ambulatory Visit: Payer: Medicare Other | Attending: Pain Medicine | Admitting: Pain Medicine

## 2016-08-06 ENCOUNTER — Encounter: Payer: Self-pay | Admitting: Pain Medicine

## 2016-08-06 VITALS — BP 134/75 | HR 65 | Temp 98.4°F | Resp 16 | Ht 70.0 in | Wt 246.0 lb

## 2016-08-06 DIAGNOSIS — M25559 Pain in unspecified hip: Secondary | ICD-10-CM

## 2016-08-06 DIAGNOSIS — M4626 Osteomyelitis of vertebra, lumbar region: Secondary | ICD-10-CM

## 2016-08-06 DIAGNOSIS — M961 Postlaminectomy syndrome, not elsewhere classified: Secondary | ICD-10-CM

## 2016-08-06 DIAGNOSIS — G8929 Other chronic pain: Secondary | ICD-10-CM | POA: Insufficient documentation

## 2016-08-06 DIAGNOSIS — M545 Low back pain, unspecified: Secondary | ICD-10-CM

## 2016-08-06 DIAGNOSIS — G894 Chronic pain syndrome: Secondary | ICD-10-CM

## 2016-08-06 DIAGNOSIS — Z79899 Other long term (current) drug therapy: Secondary | ICD-10-CM | POA: Diagnosis not present

## 2016-08-06 DIAGNOSIS — M25562 Pain in left knee: Secondary | ICD-10-CM | POA: Diagnosis not present

## 2016-08-06 DIAGNOSIS — E559 Vitamin D deficiency, unspecified: Secondary | ICD-10-CM | POA: Diagnosis not present

## 2016-08-06 DIAGNOSIS — M4646 Discitis, unspecified, lumbar region: Secondary | ICD-10-CM | POA: Insufficient documentation

## 2016-08-06 DIAGNOSIS — M2578 Osteophyte, vertebrae: Secondary | ICD-10-CM | POA: Insufficient documentation

## 2016-08-06 DIAGNOSIS — Z981 Arthrodesis status: Secondary | ICD-10-CM | POA: Insufficient documentation

## 2016-08-06 DIAGNOSIS — M4820 Kissing spine, site unspecified: Secondary | ICD-10-CM | POA: Diagnosis not present

## 2016-08-06 DIAGNOSIS — I10 Essential (primary) hypertension: Secondary | ICD-10-CM

## 2016-08-06 DIAGNOSIS — I471 Supraventricular tachycardia, unspecified: Secondary | ICD-10-CM

## 2016-08-06 DIAGNOSIS — Z9889 Other specified postprocedural states: Secondary | ICD-10-CM

## 2016-08-06 DIAGNOSIS — M542 Cervicalgia: Secondary | ICD-10-CM | POA: Insufficient documentation

## 2016-08-06 DIAGNOSIS — M47816 Spondylosis without myelopathy or radiculopathy, lumbar region: Secondary | ICD-10-CM

## 2016-08-06 DIAGNOSIS — M1712 Unilateral primary osteoarthritis, left knee: Secondary | ICD-10-CM | POA: Insufficient documentation

## 2016-08-06 DIAGNOSIS — E7800 Pure hypercholesterolemia, unspecified: Secondary | ICD-10-CM | POA: Diagnosis not present

## 2016-08-06 DIAGNOSIS — E119 Type 2 diabetes mellitus without complications: Secondary | ICD-10-CM | POA: Diagnosis not present

## 2016-08-06 DIAGNOSIS — M48061 Spinal stenosis, lumbar region without neurogenic claudication: Secondary | ICD-10-CM

## 2016-08-06 DIAGNOSIS — F1190 Opioid use, unspecified, uncomplicated: Secondary | ICD-10-CM

## 2016-08-06 DIAGNOSIS — M5136 Other intervertebral disc degeneration, lumbar region: Secondary | ICD-10-CM

## 2016-08-06 DIAGNOSIS — M9981 Other biomechanical lesions of cervical region: Secondary | ICD-10-CM

## 2016-08-06 DIAGNOSIS — M7138 Other bursal cyst, other site: Secondary | ICD-10-CM

## 2016-08-06 DIAGNOSIS — M9983 Other biomechanical lesions of lumbar region: Secondary | ICD-10-CM

## 2016-08-06 DIAGNOSIS — M4696 Unspecified inflammatory spondylopathy, lumbar region: Secondary | ICD-10-CM

## 2016-08-06 DIAGNOSIS — M51369 Other intervertebral disc degeneration, lumbar region without mention of lumbar back pain or lower extremity pain: Secondary | ICD-10-CM

## 2016-08-06 DIAGNOSIS — M791 Myalgia: Secondary | ICD-10-CM

## 2016-08-06 DIAGNOSIS — Z79891 Long term (current) use of opiate analgesic: Secondary | ICD-10-CM | POA: Insufficient documentation

## 2016-08-06 DIAGNOSIS — M7918 Myalgia, other site: Secondary | ICD-10-CM

## 2016-08-06 DIAGNOSIS — M4802 Spinal stenosis, cervical region: Secondary | ICD-10-CM

## 2016-08-06 DIAGNOSIS — M160 Bilateral primary osteoarthritis of hip: Secondary | ICD-10-CM | POA: Insufficient documentation

## 2016-08-06 DIAGNOSIS — M5030 Other cervical disc degeneration, unspecified cervical region: Secondary | ICD-10-CM | POA: Diagnosis not present

## 2016-08-06 DIAGNOSIS — M4826 Kissing spine, lumbar region: Secondary | ICD-10-CM | POA: Diagnosis not present

## 2016-08-06 DIAGNOSIS — R937 Abnormal findings on diagnostic imaging of other parts of musculoskeletal system: Secondary | ICD-10-CM | POA: Diagnosis not present

## 2016-08-06 DIAGNOSIS — K219 Gastro-esophageal reflux disease without esophagitis: Secondary | ICD-10-CM | POA: Diagnosis not present

## 2016-08-06 DIAGNOSIS — M47812 Spondylosis without myelopathy or radiculopathy, cervical region: Secondary | ICD-10-CM | POA: Insufficient documentation

## 2016-08-06 HISTORY — DX: Discitis, unspecified, lumbar region: M46.46

## 2016-08-06 LAB — COMPREHENSIVE METABOLIC PANEL
ALT: 30 U/L (ref 17–63)
AST: 28 U/L (ref 15–41)
Albumin: 4.2 g/dL (ref 3.5–5.0)
Alkaline Phosphatase: 71 U/L (ref 38–126)
Anion gap: 9 (ref 5–15)
BUN: 17 mg/dL (ref 6–20)
CO2: 28 mmol/L (ref 22–32)
Calcium: 9.1 mg/dL (ref 8.9–10.3)
Chloride: 96 mmol/L — ABNORMAL LOW (ref 101–111)
Creatinine, Ser: 0.87 mg/dL (ref 0.61–1.24)
GFR calc Af Amer: >60 mL/min (ref >60)
GFR calc non Af Amer: >60 mL/min (ref >60)
Glucose, Bld: 230 mg/dL — ABNORMAL HIGH (ref 65–99)
Potassium: 4 mmol/L (ref 3.5–5.1)
Sodium: 133 mmol/L — ABNORMAL LOW (ref 135–145)
Total Bilirubin: 0.9 mg/dL (ref 0.3–1.2)
Total Protein: 7.2 g/dL (ref 6.5–8.1)

## 2016-08-06 LAB — CBC WITH DIFFERENTIAL/PLATELET
Basophils Absolute: 0.1 10*3/uL (ref 0–0.1)
Basophils Relative: 1 %
Eosinophils Absolute: 0.2 10*3/uL (ref 0–0.7)
Eosinophils Relative: 2 %
HCT: 40.2 % (ref 40.0–52.0)
Hemoglobin: 13.4 g/dL (ref 13.0–18.0)
Lymphocytes Relative: 21 %
Lymphs Abs: 1.8 10*3/uL (ref 1.0–3.6)
MCH: 28.9 pg (ref 26.0–34.0)
MCHC: 33.2 g/dL (ref 32.0–36.0)
MCV: 87 fL (ref 80.0–100.0)
Monocytes Absolute: 0.6 10*3/uL (ref 0.2–1.0)
Monocytes Relative: 7 %
Neutro Abs: 5.8 10*3/uL (ref 1.4–6.5)
Neutrophils Relative %: 69 %
Platelets: 275 10*3/uL (ref 150–440)
RBC: 4.63 MIL/uL (ref 4.40–5.90)
RDW: 15.8 % — ABNORMAL HIGH (ref 11.5–14.5)
WBC: 8.4 10*3/uL (ref 3.8–10.6)

## 2016-08-06 LAB — C-REACTIVE PROTEIN: CRP: <0.8 mg/dL (ref <1.0)

## 2016-08-06 LAB — VITAMIN B12: Vitamin B-12: 209 pg/mL (ref 180–914)

## 2016-08-06 LAB — SEDIMENTATION RATE: Sed Rate: 10 mm/hr (ref 0–20)

## 2016-08-06 LAB — MAGNESIUM: Magnesium: 1.7 mg/dL (ref 1.7–2.4)

## 2016-08-06 NOTE — Patient Instructions (Addendum)
Pain Score  Introduction: The pain score used by this practice is the Verbal Numerical Rating Scale (VNRS-11). This is an 11-point scale. It is for adults and children 10 years or older. There are significant differences in how the pain score is reported, used, and applied. Forget everything you learned in the past and learn this scoring system.  General Information: The scale should reflect your current level of pain. Unless you are specifically asked for the level of your worst pain, or your average pain. If you are asked for one of these two, then it should be understood that it is over the past 24 hours.  Basic Activities of Daily Living (ADL): Personal hygiene, dressing, eating, transferring, and using restroom.  Instructions: Most patients tend to report their level of pain as a combination of two factors, their physical pain and their psychosocial pain. This last one is also known as "suffering" and it is reflection of how physical pain affects you socially and psychologically. From now on, report them separately. From this point on, when asked to report your pain level, report only your physical pain. Use the following table for reference.  Pain Clinic Pain Levels (0-5/10)  Pain Level Score Description  No Pain 0   Mild pain 1 Nagging, annoying, but does not interfere with basic activities of daily living (ADL). Patients are able to eat, bathe, get dressed, toileting (being able to get on and off the toilet and perform personal hygiene functions), transfer (move in and out of bed or a chair without assistance), and maintain continence (able to control bladder and bowel functions). Blood pressure and heart rate are unaffected. A normal heart rate for a healthy adult ranges from 60 to 100 bpm (beats per minute).   Mild to moderate pain 2 Noticeable and distracting. Impossible to hide from other people. More frequent flare-ups. Still possible to adapt and function close to normal. It can be very  annoying and may have occasional stronger flare-ups. With discipline, patients may get used to it and adapt.   Moderate pain 3 Interferes significantly with activities of daily living (ADL). It becomes difficult to feed, bathe, get dressed, get on and off the toilet or to perform personal hygiene functions. Difficult to get in and out of bed or a chair without assistance. Very distracting. With effort, it can be ignored when deeply involved in activities.   Moderately severe pain 4 Impossible to ignore for more than a few minutes. With effort, patients may still be able to manage work or participate in some social activities. Very difficult to concentrate. Signs of autonomic nervous system discharge are evident: dilated pupils (mydriasis); mild sweating (diaphoresis); sleep interference. Heart rate becomes elevated (>115 bpm). Diastolic blood pressure (lower number) rises above 100 mmHg. Patients find relief in laying down and not moving.   Severe pain 5 Intense and extremely unpleasant. Associated with frowning face and frequent crying. Pain overwhelms the senses.  Ability to do any activity or maintain social relationships becomes significantly limited. Conversation becomes difficult. Pacing back and forth is common, as getting into a comfortable position is nearly impossible. Pain wakes you up from deep sleep. Physical signs will be obvious: pupillary dilation; increased sweating; goosebumps; brisk reflexes; cold, clammy hands and feet; nausea, vomiting or dry heaves; loss of appetite; significant sleep disturbance with inability to fall asleep or to remain asleep. When persistent, significant weight loss is observed due to the complete loss of appetite and sleep deprivation.  Blood pressure and heart   rate becomes significantly elevated. Caution: If elevated blood pressure triggers a pounding headache associated with blurred vision, then the patient should immediately seek attention at an urgent or  emergency care unit, as these may be signs of an impending stroke.    Emergency Department Pain Levels (6-10/10)  Emergency Room Pain 6 Severely limiting. Requires emergency care and should not be seen or managed at an outpatient pain management facility. Communication becomes difficult and requires great effort. Assistance to reach the emergency department may be required. Facial flushing and profuse sweating along with potentially dangerous increases in heart rate and blood pressure will be evident.   Distressing pain 7 Self-care is very difficult. Assistance is required to transport, or use restroom. Assistance to reach the emergency department will be required. Tasks requiring coordination, such as bathing and getting dressed become very difficult.   Disabling pain 8 Self-care is no longer possible. At this level, pain is disabling. The individual is unable to do even the most "basic" activities such as walking, eating, bathing, dressing, transferring to a bed, or toileting. Fine motor skills are lost. It is difficult to think clearly.   Incapacitating pain 9 Pain becomes incapacitating. Thought processing is no longer possible. Difficult to remember your own name. Control of movement and coordination are lost.   The worst pain imaginable 10 At this level, most patients pass out from pain. When this level is reached, collapse of the autonomic nervous system occurs, leading to a sudden drop in blood pressure and heart rate. This in turn results in a temporary and dramatic drop in blood flow to the brain, leading to a loss of consciousness. Fainting is one of the body's self defense mechanisms. Passing out puts the brain in a calmed state and causes it to shut down for a while, in order to begin the healing process.    Summary: 1. Refer to this scale when providing [place] with your pain level. 2. Be accurate and careful when reporting your pain level. This will help with your care. 3. Over-reporting  your pain level will lead to loss of credibility. 4. Even a level of 1/10 means that there is pain and will be treated at our facility. 5. High, inaccurate reporting will be documented as "Symptom Exaggeration", leading to loss of credibility and suspicions of possible secondary gains such as obtaining more narcotics, or wanting to appear disabled, for fraudulent reasons. 6. Only pain levels of 5 or below will be seen at our facility. 7. Pain levels of 6 and above will be sent to the Emergency Department and the appointment cancelled.  Please get your labs and x-rays done as soon as possible. _____________________________________________________________________________________________

## 2016-08-06 NOTE — Progress Notes (Signed)
Safety precautions to be maintained throughout the outpatient stay will include: orient to surroundings, keep bed in low position, maintain call bell within reach at all times, provide assistance with transfer out of bed and ambulation.  

## 2016-08-10 ENCOUNTER — Other Ambulatory Visit: Payer: Self-pay

## 2016-08-10 LAB — 25-HYDROXY VITAMIN D LCMS D2+D3
25-Hydroxy, Vitamin D-2: <1 ng/mL
25-Hydroxy, Vitamin D-3: 29 ng/mL
25-Hydroxy, Vitamin D: 29 ng/mL — ABNORMAL LOW

## 2016-08-10 NOTE — Progress Notes (Signed)
Results were reviewed and found to be: significantly abnormal  Further testing may be useful  Review would suggest interventional pain management techniques may be of benefit

## 2016-08-10 NOTE — Progress Notes (Signed)
Results were reviewed and found to be: mildly abnormal  Further testing may be useful  Review would suggest interventional pain management techniques may be of benefit

## 2016-08-10 NOTE — Progress Notes (Signed)
Results were reviewed and found to be: significantly abnormal  Further testing may be useful

## 2016-08-10 NOTE — Progress Notes (Signed)
Results were reviewed and found to be: mildly abnormal  No acute injury or pathology identified  Review would suggest interventional pain management techniques may be of benefit 

## 2016-08-12 LAB — COMPLIANCE DRUG ANALYSIS, UR

## 2016-08-18 ENCOUNTER — Telehealth: Payer: Self-pay

## 2016-08-25 ENCOUNTER — Other Ambulatory Visit: Payer: Self-pay | Admitting: Pain Medicine

## 2016-08-25 ENCOUNTER — Other Ambulatory Visit: Payer: Self-pay

## 2016-08-25 DIAGNOSIS — M4626 Osteomyelitis of vertebra, lumbar region: Secondary | ICD-10-CM

## 2016-08-25 DIAGNOSIS — M4646 Discitis, unspecified, lumbar region: Secondary | ICD-10-CM

## 2016-09-08 ENCOUNTER — Other Ambulatory Visit: Payer: Self-pay | Admitting: *Deleted

## 2016-09-08 MED ORDER — SPIRONOLACTONE 25 MG PO TABS: 25.0000 mg | ORAL_TABLET | Freq: Every day | ORAL | 3 refills | Status: DC

## 2016-09-08 MED ORDER — CARVEDILOL 6.25 MG PO TABS: 6.2500 mg | ORAL_TABLET | Freq: Two times a day (BID) | ORAL | 3 refills | Status: DC

## 2016-09-09 ENCOUNTER — Encounter
Admission: RE | Admit: 2016-09-09 | Discharge: 2016-09-09 | Disposition: A | Discharge status: Home / Self Care | Payer: Medicare Other | Source: Ambulatory Visit | Attending: Pain Medicine | Admitting: Pain Medicine

## 2016-09-09 DIAGNOSIS — M4646 Discitis, unspecified, lumbar region: Secondary | ICD-10-CM | POA: Insufficient documentation

## 2016-09-09 DIAGNOSIS — M4626 Osteomyelitis of vertebra, lumbar region: Secondary | ICD-10-CM | POA: Insufficient documentation

## 2016-09-09 MED ORDER — TECHNETIUM TC 99M MEDRONATE IV KIT
22.1600 | PACK | Freq: Once | INTRAVENOUS | Status: AC | PRN
  Administered 2016-09-09: 22.16 via INTRAVENOUS

## 2016-09-10 ENCOUNTER — Other Ambulatory Visit: Payer: Medicare Other

## 2016-09-10 ENCOUNTER — Ambulatory Visit: Payer: Medicare Other | Attending: Pain Medicine | Admitting: Pain Medicine

## 2016-09-10 ENCOUNTER — Other Ambulatory Visit: Payer: Self-pay | Admitting: *Deleted

## 2016-09-10 VITALS — BP 138/97 | HR 65 | Temp 97.9°F | Resp 16 | Ht 70.0 in | Wt 250.0 lb

## 2016-09-10 DIAGNOSIS — M961 Postlaminectomy syndrome, not elsewhere classified: Secondary | ICD-10-CM | POA: Insufficient documentation

## 2016-09-10 DIAGNOSIS — J449 Chronic obstructive pulmonary disease, unspecified: Secondary | ICD-10-CM | POA: Insufficient documentation

## 2016-09-10 DIAGNOSIS — Z885 Allergy status to narcotic agent status: Secondary | ICD-10-CM | POA: Insufficient documentation

## 2016-09-10 DIAGNOSIS — F1190 Opioid use, unspecified, uncomplicated: Secondary | ICD-10-CM

## 2016-09-10 DIAGNOSIS — H547 Unspecified visual loss: Secondary | ICD-10-CM | POA: Diagnosis not present

## 2016-09-10 DIAGNOSIS — E1169 Type 2 diabetes mellitus with other specified complication: Secondary | ICD-10-CM | POA: Diagnosis not present

## 2016-09-10 DIAGNOSIS — M542 Cervicalgia: Secondary | ICD-10-CM | POA: Diagnosis not present

## 2016-09-10 DIAGNOSIS — K219 Gastro-esophageal reflux disease without esophagitis: Secondary | ICD-10-CM | POA: Insufficient documentation

## 2016-09-10 DIAGNOSIS — I10 Essential (primary) hypertension: Secondary | ICD-10-CM | POA: Diagnosis not present

## 2016-09-10 DIAGNOSIS — E785 Hyperlipidemia, unspecified: Secondary | ICD-10-CM | POA: Insufficient documentation

## 2016-09-10 DIAGNOSIS — Z888 Allergy status to other drugs, medicaments and biological substances status: Secondary | ICD-10-CM | POA: Diagnosis not present

## 2016-09-10 DIAGNOSIS — Z79891 Long term (current) use of opiate analgesic: Secondary | ICD-10-CM | POA: Insufficient documentation

## 2016-09-10 DIAGNOSIS — G894 Chronic pain syndrome: Secondary | ICD-10-CM | POA: Insufficient documentation

## 2016-09-10 DIAGNOSIS — M4646 Discitis, unspecified, lumbar region: Secondary | ICD-10-CM | POA: Diagnosis not present

## 2016-09-10 DIAGNOSIS — Z9889 Other specified postprocedural states: Secondary | ICD-10-CM | POA: Diagnosis not present

## 2016-09-10 DIAGNOSIS — M4696 Unspecified inflammatory spondylopathy, lumbar region: Secondary | ICD-10-CM | POA: Insufficient documentation

## 2016-09-10 DIAGNOSIS — Z6835 Body mass index (BMI) 35.0-35.9, adult: Secondary | ICD-10-CM | POA: Insufficient documentation

## 2016-09-10 DIAGNOSIS — Z87891 Personal history of nicotine dependence: Secondary | ICD-10-CM | POA: Diagnosis not present

## 2016-09-10 DIAGNOSIS — Z981 Arthrodesis status: Secondary | ICD-10-CM | POA: Insufficient documentation

## 2016-09-10 DIAGNOSIS — Z7984 Long term (current) use of oral hypoglycemic drugs: Secondary | ICD-10-CM | POA: Insufficient documentation

## 2016-09-10 DIAGNOSIS — I25110 Atherosclerotic heart disease of native coronary artery with unstable angina pectoris: Secondary | ICD-10-CM | POA: Diagnosis not present

## 2016-09-10 DIAGNOSIS — E559 Vitamin D deficiency, unspecified: Secondary | ICD-10-CM | POA: Diagnosis not present

## 2016-09-10 DIAGNOSIS — M545 Low back pain, unspecified: Secondary | ICD-10-CM

## 2016-09-10 DIAGNOSIS — M25559 Pain in unspecified hip: Secondary | ICD-10-CM | POA: Diagnosis not present

## 2016-09-10 DIAGNOSIS — M4626 Osteomyelitis of vertebra, lumbar region: Secondary | ICD-10-CM | POA: Diagnosis not present

## 2016-09-10 DIAGNOSIS — Z79899 Other long term (current) drug therapy: Secondary | ICD-10-CM

## 2016-09-10 DIAGNOSIS — G8929 Other chronic pain: Secondary | ICD-10-CM

## 2016-09-10 DIAGNOSIS — M452 Ankylosing spondylitis of cervical region: Secondary | ICD-10-CM | POA: Insufficient documentation

## 2016-09-10 MED ORDER — HYDROCODONE-ACETAMINOPHEN 5-325 MG PO TABS: 1.0000 | ORAL_TABLET | Freq: Two times a day (BID) | ORAL | 0 refills | Status: DC

## 2016-09-10 MED ORDER — AZILSARTAN-CHLORTHALIDONE 40-25 MG PO TABS: 1.0000 | ORAL_TABLET | Freq: Every day | ORAL | 3 refills | Status: DC

## 2016-09-10 NOTE — Progress Notes (Signed)
Nursing Pain Medication Assessment:  Safety precautions to be maintained throughout the outpatient stay will include: orient to surroundings, keep bed in low position, maintain call bell within reach at all times, provide assistance with transfer out of bed and ambulation.  Medication Inspection Compliance: Pill count conducted under aseptic conditions, in front of the patient. Neither the pills nor the bottle was removed from the patient's sight at any time. Once count was completed pills were immediately returned to the patient in their original bottle.  Medication: See above Pill/Patch Count: 6 of 60 pills remain Pill/Patch Appearance: Markings consistent with prescribed medication Bottle Appearance: Standard pharmacy container. Clearly labeled. Filled Date: 08/19/16  Last Medication intake:  Yesterday

## 2016-09-10 NOTE — Patient Instructions (Addendum)
____________________________________________________________________________________________  Medication Rules  Applies to: All patients receiving prescriptions (written or electronic).  Pharmacy of record: Pharmacy where electronic prescriptions will be sent. If written prescriptions are taken to a different pharmacy, please inform the nursing staff. The pharmacy listed in the electronic medical record should be the one where you would like electronic prescriptions to be sent.  Prescription refills: Only during scheduled appointments. Applies to both, written and electronic prescriptions.  NOTE: The following applies primarily to controlled substances (Opioid Pain Medications)  Patient's responsibilities: 1. Pain Pills: Bring all pain pills to every appointment (except for procedure appointments). 2. Pill Bottles: Bring pills in original pharmacy bottle. Always bring newest bottle. Bring bottle, even if empty. 3. Medication refills: You are responsible for knowing and keeping track of what medications you need refilled. The day before your appointment, write a list of all prescriptions that need to be refilled. Bring that list to your appointment and give it to the admitting nurse. Prescriptions will be written only during appointments. If you forget a medication, it will not be "Called in", "Faxed", or "electronically sent". You will need to get another appointment to get these prescribed. 4. Prescription Accuracy: You are responsible for carefully inspecting your prescriptions before leaving our office. Have the discharge nurse carefully go over each prescription with you, before taking them home. Make sure that your name is accurately spelled, that your address is correct. Check the name and dose of your medication to make sure it is accurate. Check the number of pills, and the written instructions to make sure they are clear and accurate. Make sure that you are given enough medication to last  until your next medication refill appointment. 5. Taking Medication: Take medication as prescribed. Never take more pills than instructed. Never take medication more frequently than prescribed. Taking less pills or less frequently is permitted and encouraged, when it comes to controlled substances (written prescriptions).  6. Inform other Doctors: Always inform, all of your healthcare providers, of all the medications you take. 7. Pain Medication from other Providers: You are not allowed to accept any additional pain medication from any other Doctor or Healthcare provider. There are two exceptions to this rule. (see below) In the event that you require additional pain medication, you are responsible for notifying us, as stated below. 8. Medication Agreement: You are responsible for carefully reading and following our Medication Agreement. This must be signed before receiving any prescriptions from our practice. Safely store a copy of your signed Agreement. Violations to the Agreement will result in no further prescriptions. (Additional copies of our Medication Agreement are available upon request.) 9. Laws, Rules, & Regulations: All patients are expected to follow all Federal and [employer], Rules, [employer]. Ignorance of the Laws does not constitute a valid excuse.  Exceptions: There are only two exceptions to the rule of not receiving pain medications from other Healthcare Providers. 1. Exception #1 (Emergencies): In the event of an emergency (i.e.: accident requiring emergency care), you are allowed to receive additional pain medication. However, you are responsible for: As soon as you are able, call our office [phone], at any time of the day or night, and leave a message stating your name, the date and nature of the emergency, and the name and dose of the medication prescribed. In the event that your call is answered by a member of our staff, make sure to document and save the date,  time, and the name of the person that  took your information.  2. Exception #2 (Planned Surgery): In the event that you are scheduled by another doctor or dentist to have any type of surgery or procedure, you are allowed (for a period no longer than 30 days), to receive additional pain medication, for the acute post-op pain. However, in this case, you are responsible for picking up a copy of our "Post-op Pain Management for Surgeons" handout, and giving it to your surgeon or dentist. This document is available at our office, and does not require an appointment to obtain it. Simply go to our office during business hours (Monday-Thursday from 8:00 AM to 4:00 PM) (Friday 8:00 AM to 12:00 Noon) or if you have a scheduled appointment with [place], prior to your surgery, and ask for it by name. In addition, you will need to provide [place] with your name, name of your surgeon, type of surgery, and date of procedure or surgery.  ____________________________________________________________________________________________  ____________________________________________________________________________________________  Appointment Policy Summary  It is our goal and responsibility to provide the medical community with assistance in the evaluation and management of patients with chronic pain. Unfortunately our resources are limited. Because we do not have an unlimited amount of time, or available appointments, we are required to closely monitor and manage their use. The following rules exist to maximize their use:  Patient's responsibilities: 1. Punctuality: You are required to be physically present and registered in our facility at least 30 minutes before your appointment. 2. Tardiness: The cutoff is your appointment time. If you have an appointment scheduled for 10:00 AM and you arrive at 10:01, you will be required to reschedule your appointment.  3. Plan ahead: Always assume that you will encounter traffic on your way in.  Plan for it. If you are dependent on a driver, make sure they understand these rules and the need to arrive early. 4. Other appointments and responsibilities: Avoid scheduling any other appointments before or after your pain clinic appointments.  5. Be prepared: Write down everything that you need to discuss with your healthcare provider and give this information to the admitting nurse. Write down the medications that you will need refilled. Bring your pills and bottles (even the empty ones), to all of your appointments, except for those where a procedure is scheduled. 6. No children or pets: Find someone to take care of them. It is not appropriate to bring them in. 7. Scheduling changes: We request "advanced notification" of any changes or cancellations. 8. Advanced notification: Defined as a time period of more than 24 hours prior to the originally scheduled appointment. This allows for the appointment to be offered to other patients. 9. Rescheduling: When a visit is rescheduled, it will require the cancellation of the original appointment. For this reason they both fall within the category of "Cancellations".  10. Cancellations: They require advanced notification. Any cancellation less than 24 hours before the  appointment will be recorded as a "No Show". 11. No Show: Defined as an unkept appointment where the patient failed to notify or declare to the practice their intention or inability to keep the appointment.  Corrective process for repeat offenders:  1. Tardiness: Three (3) episodes of rescheduling due to late arrivals will be recorded as one (1) "No Show". 2. Cancellation or reschedule: Three (3) cancellations or rescheduling will be recorded as one (1) "No Show". 3. "No Shows": Three (3) "No Shows" within a 12 month period will result in discharge from the  practice.  ____________________________________________________________________________________________  ____________________________________________________________________________________________  Pain Scale  Introduction: The pain score used by this practice is the Verbal Numerical Rating Scale (VNRS-11). This is an 11-point scale. It is for adults and children 10 years or older. There are significant differences in how the pain score is reported, used, and applied. Forget everything you learned in the past and learn this scoring system.  General Information: The scale should reflect your current level of pain. Unless you are specifically asked for the level of your worst pain, or your average pain. If you are asked for one of these two, then it should be understood that it is over the past 24 hours.  Basic Activities of Daily Living (ADL): Personal hygiene, dressing, eating, transferring, and using restroom.  Instructions: Most patients tend to report their level of pain as a combination of two factors, their physical pain and their psychosocial pain. This last one is also known as "suffering" and it is reflection of how physical pain affects you socially and psychologically. From now on, report them separately. From this point on, when asked to report your pain level, report only your physical pain. Use the following table for reference.  Pain Clinic Pain Levels (0-5/10)  Pain Level Score  Description  No Pain 0   Mild pain 1 Nagging, annoying, but does not interfere with basic activities of daily living (ADL). Patients are able to eat, bathe, get dressed, toileting (being able to get on and off the toilet and perform personal hygiene functions), transfer (move in and out of bed or a chair without assistance), and maintain continence (able to control bladder and bowel functions). Blood pressure and heart rate are unaffected. A normal heart rate for a healthy adult ranges from 60 to 100 bpm  (beats per minute).   Mild to moderate pain 2 Noticeable and distracting. Impossible to hide from other people. More frequent flare-ups. Still possible to adapt and function close to normal. It can be very annoying and may have occasional stronger flare-ups. With discipline, patients may get used to it and adapt.   Moderate pain 3 Interferes significantly with activities of daily living (ADL). It becomes difficult to feed, bathe, get dressed, get on and off the toilet or to perform personal hygiene functions. Difficult to get in and out of bed or a chair without assistance. Very distracting. With effort, it can be ignored when deeply involved in activities.   Moderately severe pain 4 Impossible to ignore for more than a few minutes. With effort, patients may still be able to manage work or participate in some social activities. Very difficult to concentrate. Signs of autonomic nervous system discharge are evident: dilated pupils (mydriasis); mild sweating (diaphoresis); sleep interference. Heart rate becomes elevated (>115 bpm). Diastolic blood pressure (lower number) rises above 100 mmHg. Patients find relief in laying down and not moving.   Severe pain 5 Intense and extremely unpleasant. Associated with frowning face and frequent crying. Pain overwhelms the senses.  Ability to do any activity or maintain social relationships becomes significantly limited. Conversation becomes difficult. Pacing back and forth is common, as getting into a comfortable position is nearly impossible. Pain wakes you up from deep sleep. Physical signs will be obvious: pupillary dilation; increased sweating; goosebumps; brisk reflexes; cold, clammy hands and feet; nausea, vomiting or dry heaves; loss of appetite; significant sleep disturbance with inability to fall asleep or to remain asleep. When persistent, significant weight loss is observed due to the complete loss of appetite and sleep deprivation.  Blood pressure and heart  rate  becomes significantly elevated. Caution: If elevated blood pressure triggers a pounding headache associated with blurred vision, then the patient should immediately seek attention at an urgent or emergency care unit, as these may be signs of an impending stroke.    Emergency Department Pain Levels (6-10/10)  Emergency Room Pain 6 Severely limiting. Requires emergency care and should not be seen or managed at an outpatient pain management facility. Communication becomes difficult and requires great effort. Assistance to reach the emergency department may be required. Facial flushing and profuse sweating along with potentially dangerous increases in heart rate and blood pressure will be evident.   Distressing pain 7 Self-care is very difficult. Assistance is required to transport, or use restroom. Assistance to reach the emergency department will be required. Tasks requiring coordination, such as bathing and getting dressed become very difficult.   Disabling pain 8 Self-care is no longer possible. At this level, pain is disabling. The individual is unable to do even the most "basic" activities such as walking, eating, bathing, dressing, transferring to a bed, or toileting. Fine motor skills are lost. It is difficult to think clearly.   Incapacitating pain 9 Pain becomes incapacitating. Thought processing is no longer possible. Difficult to remember your own name. Control of movement and coordination are lost.   The worst pain imaginable 10 At this level, most patients pass out from pain. When this level is reached, collapse of the autonomic nervous system occurs, leading to a sudden drop in blood pressure and heart rate. This in turn results in a temporary and dramatic drop in blood flow to the brain, leading to a loss of consciousness. Fainting is one of the body's self defense mechanisms. Passing out puts the brain in a calmed state and causes it to shut down for a while, in order to begin the  healing process.    Summary: 1. Refer to this scale when providing [place] with your pain level. 2. Be accurate and careful when reporting your pain level. This will help with your care. 3. Over-reporting your pain level will lead to loss of credibility. 4. Even a level of 1/10 means that there is pain and will be treated at our facility. 5. High, inaccurate reporting will be documented as "Symptom Exaggeration", leading to loss of credibility and suspicions of possible secondary gains such as obtaining more narcotics, or wanting to appear disabled, for fraudulent reasons. 6. Only pain levels of 5 or below will be seen at our facility. 7. Pain levels of 6 and above will be sent to the Emergency Department and the appointment cancelled. _______________________________________  NEED TO TAKE VITAMIN D AND CALICUM DAILY- CAN BUY OVER THE COUNTER  rEFERRAL TO NEUROSURGERY AND INFECTIOUS DISESES_____________________________________________________

## 2016-09-10 NOTE — Progress Notes (Signed)
Patient's Name: Howard Maxwell  MRN: [id number]  Referring Provider: Tracie Harrier, MD  DOB: [birth year]  PCP: Tracie Harrier, MD  DOS: 09/10/2016  Note by: Kathlen Brunswick. Dossie Arbour, MD  Service setting: Ambulatory outpatient  Specialty: Interventional Pain Management  Location: [facility] (AMB) Pain Management Facility    Patient type: Established   Primary Reason(s) for Visit: Encounter for evaluation before starting new chronic pain management plan of care (Level of risk: moderate) CC: Back Pain (lower); Knee Pain (left); and Neck Pain (radiates to left shoulder)  HPI  Howard Maxwell is a 71 y.o. year old, male patient, who comes today for a follow-up evaluation to review the test results and decide on a treatment plan. 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); History of cervical spinal surgery (C3-4 and C6-7 ACDF); Discitis of lumbar region (L1-2); 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); Musculoskeletal pain; Osteoarthritis of lumbar spine; Chronic low back pain (Location of Primary Source of Pain) (Bilateral) (R>L); Chronic hip pain (Location of Secondary source of pain) (Bilateral) (R>L); Chronic neck pain (Location of Tertiary source of pain) (Bilateral) (L>R); Chronic knee pain (Left); 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, C5-6, C6-7; 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) (s/p ACDF); 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 His primarily concern today is the Back Pain (lower); Knee Pain (left); and Neck Pain (radiates to left shoulder)  Pain Assessment: Self-Reported Pain Score: 2 /10             Reported level is compatible with observation.       Pain Type: Chronic pain Pain Location: Back Pain Orientation: Lower Pain Descriptors / Indicators: Aching, Sharp, Stabbing Pain Frequency: Constant  Howard Maxwell comes in today for a follow-up visit after his initial evaluation on 08/06/2016. Today we went over the results of his tests. These were explained in "Layman's terms". During today's appointment we went over my diagnostic impression, as well as the proposed treatment plan. At this point, based on the results of his recent bone scan and prior radiological exams, I believe he needs to be evaluated by a Neurosurgeon and an Infectious Diseases specialist to determine if further tests or treatments are needed for the patient's osteomyelitis/diskitis problem. Should they determine that this is not an infectious osteomyelitis/diskitis, then we will reconsider the possibility of interventional treatments for his low back pain. At this point, I will stay away from the area, until this is clarified. I do understand that his current CBC, CRP, & ESR results would argue against an active infection, but we all know that this is no guarantee that there is not one. I'll need to know if a biopsy and culture, and/or a white blood cell scan may be needed. For this reason, the  patient is being  referred to those with the expertise to do and interpret them.  In considering the treatment plan options, Howard Maxwell was reminded that I no longer take patients for medication management only. I asked him to let me know if he had no intention of taking advantage of the interventional therapies, so that we could make arrangements to provide this space to someone interested. I also made it clear that undergoing interventional therapies for the purpose of getting pain medications is very inappropriate on the part of a patient, and it will not be tolerated in this practice. This type of behavior would suggest true addiction and therefore it requires referral to an addiction specialist.   Further details on both, my assessment(s), as well as the proposed treatment plan, please see below. Controlled Substance Pharmacotherapy Assessment REMS (Risk Evaluation and Mitigation Strategy)  Analgesic: Hydrocodone/APAP 5/325 one tablet by mouth twice a day (filled on 07/17/2016) Highest recorded MME/day: 50 mg/day MME/day: 10 mg/day Pill Count: See nursing notes. Pharmacokinetics: Liberation and absorption (onset of action): WNL Distribution (time to peak effect): WNL Metabolism and excretion (duration of action): WNL         Pharmacodynamics: Desired effects: Analgesia: Mr. Auguste reports >50% benefit. Functional ability: Patient reports that medication allows him to accomplish basic ADLs Clinically meaningful improvement in function (CMIF): Sustained CMIF goals met Perceived effectiveness: Described as relatively effective, allowing for increase in activities of daily living (ADL) Undesirable effects: Side-effects or Adverse reactions: None reported Monitoring: [place] PMP: Online review of the past 44-monthperiod previously conducted. Not applicable at this point since we have not taken over the patient's medication management yet. List of all Serum Drug Screening Test(s):  No results found for:  AMPHSCRSER, BARBSCRSER, BENZOSCRSER, COCAINSCRSER, PCPSCRSER, THCSCRSER, OPIATESCRSER, [place] PPort AllenList of all UDS test(s) done:  Lab Results  Component Value Date   SUMMARY FINAL 08/06/2016   Last UDS on record: Summary  Date Value Ref Range Status  08/06/2016 FINAL  Final    Comment:    ==================================================================== TOXASSURE COMP DRUG ANALYSIS,UR ==================================================================== Test                             Result       Flag       Units Drug Present and Declared for Prescription Verification   Hydrocodone                    252          EXPECTED   ng/mg creat   Norhydrocodone                 345          EXPECTED   ng/mg creat    Sources of hydrocodone include scheduled prescription    medications. Norhydrocodone is an expected metabolite of    hydrocodone.   Methocarbamol                  PRESENT      EXPECTED Drug Absent but Declared for Prescription Verification   Acetaminophen                  Not Detected UNEXPECTED    Acetaminophen, as indicated in the declared medication list, is    not always detected even when used as directed.   Ibuprofen  Not Detected UNEXPECTED    Ibuprofen, as indicated in the declared medication list, is not    always detected even when used as directed. ==================================================================== Test                      Result    Flag   Units      Ref Range   Creatinine              60               mg/dL      >=20 ==================================================================== Declared Medications:  The flagging and interpretation on this report are based on the  following declared medications.  Unexpected results may arise from  inaccuracies in the declared medications.  **Note: The testing scope of this panel includes these medications:  Hydrocodone (Hydrocodone-Acetaminophen)  Methocarbamol   **Note: The testing scope of this panel does not include small to  moderate amounts of these reported medications:  Acetaminophen (Hydrocodone-Acetaminophen)  Ibuprofen  **Note: The testing scope of this panel does not include following  reported medications:  Atorvastatin  Azilsartan  Carvedilol  Chlorthalidone  Cholecalciferol  Eye Drops  Glipizide  Magnesium  Metformin  Pantoprazole  Potassium  Spironolactone ==================================================================== For clinical consultation, please call [phone]. ====================================================================    UDS interpretation: Unexpected findings not considered significantly abnormal          Medication Assessment Form: Patient introduced to form today Treatment compliance: Treatment may start today if patient agrees with proposed plan. Evaluation of compliance is not applicable at this point Risk Assessment Profile: Aberrant behavior: See initial evaluations. None observed or detected today Comorbid factors increasing risk of overdose: See initial evaluation. No additional risks detected today Risk Mitigation Strategies:  Patient opioid safety counseling: Completed today. Counseling provided to patient as per "Patient Counseling Document". Document signed by patient, attesting to counseling and understanding Patient-Prescriber Agreement (PPA): Obtained today  Controlled substance notification to other providers: Written and sent today  Pharmacologic Plan: Today we may be taking over the patient's pharmacological regimen. See below  Laboratory Chemistry  Inflammation Markers Lab Results  Component Value Date   CRP <0.8 08/06/2016   ESRSEDRATE 10 08/06/2016   (CRP: Acute Phase) (ESR: Chronic Phase) Renal Function Markers Lab Results  Component Value Date   BUN 17 08/06/2016   CREATININE 0.87 08/06/2016   GFRAA >60 08/06/2016   GFRNONAA >60 08/06/2016   Hepatic Function  Markers Lab Results  Component Value Date   AST 28 08/06/2016   ALT 30 08/06/2016   ALBUMIN 4.2 08/06/2016   ALKPHOS 71 08/06/2016   Electrolytes Lab Results  Component Value Date   NA 133 (L) 08/06/2016   K 4.0 08/06/2016   CL 96 (L) 08/06/2016   CALCIUM 9.1 08/06/2016   MG 1.7 08/06/2016   Neuropathy Markers Lab Results  Component Value Date   VITAMINB12 209 08/06/2016   Bone Pathology Markers Lab Results  Component Value Date   ALKPHOS 71 08/06/2016   25OHVITD1 29 (L) 08/06/2016   25OHVITD2 <1.0 08/06/2016   25OHVITD3 29 08/06/2016   CALCIUM 9.1 08/06/2016   Coagulation Parameters Lab Results  Component Value Date   INR 1.0 01/12/2014   LABPROT 13.3 01/12/2014   APTT 33.9 01/12/2014   PLT 275 08/06/2016   Cardiovascular Markers Lab Results  Component Value Date   BNP 319 (H) 01/12/2014   HGB 13.4 08/06/2016   HCT 40.2 08/06/2016   Note: Lab results reviewed  and explained to patient in Layman's terms.  Recent Diagnostic Imaging Review  Nm Bone W/spect Result Date: 09/09/2016 CLINICAL DATA:  Low back pain.  Osteomyelitis.  Diskitis. EXAM: NM BONE SCAN AND SPECT IMAGING TECHNIQUE: After intravenous injection of radiopharmaceutical, delayed planar images were obtained in multiple projections. Additionally, delayed triplanar SPECT images were obtained through the area of interest. RADIOPHARMACEUTICALS:  22.16 mCi Tc-76mMDP COMPARISON:  Lumbar spine series 5 09/2016.  MRI 06/02/2016. FINDINGS: Bilateral renal function excretion. Lumbar vertebrae numbered as per prior MRI. Noted along the left L1-L2 level is intense increased activity corresponding to endplate sclerosis and erosive changes at this level. Adjacent soft tissue swelling noted. Findings are consistent with discitis/ osteomyelitis. Noted along the right T10-T11 disc space level is increased activity with sclerotic endplate changes. No prominent bony erosions. No adjacent soft tissue swelling. Findings most  consistent with degenerative change at this level. Activity noted over the left facets at L5-S1 most likely degenerative. IMPRESSION: Findings worrisome for L1-L2 diskitis and adjacent osteomyelitis . Electronically Signed   By: TMarcello Moores Register   On: 09/09/2016 15:52   Cervical Imaging: Cervical MR wo contrast:  Results for orders placed during the hospital encounter of 03/18/14  MR Cervical Spine Wo Contrast   Narrative CLINICAL DATA:  71year old male with chronic left side neck pain radiating to the left upper extremity. Numbness and weakness in the arm and fingers. History of cervical spine surgery in 2002. Subsequent encounter.  EXAM: MRI CERVICAL SPINE WITHOUT CONTRAST  TECHNIQUE: Multiplanar, multisequence MR imaging of the cervical spine was performed. No intravenous contrast was administered.  [facility].  FINDINGS: Mild hardware susceptibility artifact associated with ACDF hardware at the C3-C4 and C6-C7 levels, corresponding to the the radiographic appearance in 2014. Stable vertebral height and alignment. No marrow edema or evidence of acute osseous abnormality.  Cervicomedullary junction is within normal limits. No spinal cord signal abnormality identified. Negative paraspinal soft tissues.  C2-C3: Moderate to severe facet hypertrophy on the left with mild uncovertebral hypertrophy. Mild left C3 foraminal stenosis.  C3-C4: ACDF. Moderate facet and uncovertebral hypertrophy. No spinal stenosis. Moderate to severe left C4 foraminal stenosis.  C4-C5: Multifactorial spinal stenosis related to circumferential disc bulge, ligament flavum hypertrophy, and moderate facet hypertrophy greater on the left. Spinal stenosis with mild spinal cord mass effect, no cord signal abnormality. Moderate to severe left and moderate right C5 foraminal stenosis.  C5-C6: Circumferential disc osteophyte complex and mild to  moderate ligament flavum hypertrophy. Moderate facet hypertrophy. Left greater than right uncovertebral hypertrophy. Spinal stenosis with no spinal cord mass effect. Moderate to severe left and borderline to mild right C6 foraminal stenosis.  C6-C7: ACDF. Mild ligament flavum hypertrophy. Moderate facet hypertrophy. Bulky left greater than right uncovertebral hypertrophy. No spinal stenosis. Moderate to severe left and moderate right C7 foraminal stenosis.  C7-T1: Circumferential disc osteophyte complex. Mild to moderate ligament flavum hypertrophy. No spinal stenosis. Mild to moderate facet and uncovertebral hypertrophy. Mild bilateral C8 foraminal stenosis.  No upper thoracic spinal stenosis.  IMPRESSION: 1. Sequelae of ACDF at C3-C4 and C6-C7. Moderate to severe left side foraminal stenosis at both levels. 2. 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 Mild spinal cord mass effect at the former but no cord signal abnormality. Moderate to severe left greater than right C5 and left C6 neural foraminal stenosis.   Electronically Signed   By: LLars PinksM.D.   On: 03/18/2014 10:07  Cervical CT wo contrast:  Results for orders placed in visit on 04/08/01  CT Cervical Spine Wo Contrast   Narrative FINDINGS CLINICAL DATA:  NECK PAIN; S/P ANTERIOR CERVICAL FUSION AT C3-4 AND C6-7 LEVELS CERVICAL SPINE CT WITHOUT CONTRAST 2.5 MM THICK AXIAL IMAGES WERE OBTAINED FROM THE UPPER C2 LEVEL THROUGH THE MID T2 LEVEL. MULTIPLANAR SAGITTAL AND CORONAL RECONSTRUCTION IMAGES WERE ALSO OBTAINED.  THESE ARE CORRELATED WITH THE C-ARM RADIOGRAPHS OBTAINED AT [facility] ON 12/15/00.  AGAIN DEMONSTRATED IS INTERBODY BONE PLUG AND ANTERIOR SCREW AND PLATE FUSION AT THE V8-9 AND C6-7 LEVELS.  NORMAL VERTEBRAL ALIGNMENT IS DEMONSTRATED.  ON THE SAGITTAL AND AND CORONAL RECONSTRUCTION IMAGES, NO LUCENCY IS SEEN BETWEEN THE BONE PLUGS AND ADJACENT  VERTEBRAL BODIES.  ON THE AXIAL IMAGES, LUCENCY IS DEMONSTRATED BETWEEN THE BONE PLUGS AND ADJACENT INTERVERTEBRAL BODIES LATERALLY AND POSTERIORLY.  AGAIN DEMONSTRATED IS MILD ANTERIOR SPUR FORMATION AT THE C4-5 AND C5-6 LEVELS.  NO SIGNIFICANT POSTERIOR SPUR FORMATION IS DEMONSTRATED. IMPRESSION STATUS POST ANTERIOR CERVICAL FUSION AT THE C3-4 AND C6-7 LEVELS AS DESCRIBED ABOVE.  ON THE SAGITTAL AND CORONAL RECONSTRUCTION IMAGES, THE BONE PLUGS APPEAR FUSED WITH THE ADJACENT VERTEBRAL BODIES SUPERIORLY AND INFERIORLY.  ON THE AXIAL IMAGES, THERE ARE PORTIONS OF THE LATERAL AND POSTERIOR ASPECTS OF THE BONE PLUGS WHICH ARE NOT FUSED WITH THE ADJACENT BONE. MULTIPLANAR CT RECONSTRUCTION PLEASE SEE THE ABOVE REPORT. IMPRESSION SEE THE ABOVE REPORT.   Cervical CT w contrast:  Results for orders placed in visit on 11/01/00  CT Cervical Spine W Contrast   Narrative FINDINGS CERVICAL MYELOGRAM & POST-MYELOGRAM CT: HISTORY:  LT. ARM & SHOULDER PAIN. MYELOGRAM: LUMBAR PUNCTURE WAS PERFORMED BY MYSELF AT L4-5 USING A 22 GAUGE SPINAL NEEDLE.  FLUID WAS CLEAR AND COLORLESS.  8 CC OMNIPAQUE 300 WAS INSTILLED IN THE SUBARACHNOID SPACE AND MANEUVERED INTO THE CERVICAL REGION.  AP, LATERAL AND OBLIQUE VIEWS DEMONSTRATE AN EXTRADURAL DEFECT AT C6-7 ON THE LEFT.  A MORE SUBTLE DEFECT IS SEEN AT C3-4 ON THE LEFT. IMPRESSION EXTRADURAL DEFECT AT C6-7, LEFT, WITH MORE SUBTLE DEFECT SEEN ALSO AT C3-4 ON THE LEFT. POST-MYELOGRAM CT: DISC SPACES WERE EXAMINED AS FOLLOWS: C3-4:  LARGE UNCINATE SPUR, LEFT.  LEFT C-4 NERVE ROOT ENCROACHMENT IS PRESENT. C4-5:  NORMAL INTERSPACE. C5-6:  NORMAL INTERSPACE. C6-7:  HARD AND SOFT DISC CENTRAL AND TO THE LEFT.  LEFT C-7 NERVE ROOT ENCROACHMENT IS PRESENT. C7-T1:  NORMAL INTERSPACE. IMPRESSION: 1.  HARD AND SOFT DISC AT C6-7 CENTRAL AND TO THE LEFT WITH LEFT C-7 NERVE ROOT ENCROACHMENT. 2.  MODERATE-SIZED UNCINATE SPUR AT C3-4, LEFT, WITH LEFT C-4 NERVE ROOT  ENCROACHMENT.   Cervical DG 1 view:  Results for orders placed in visit on 12/15/00  DG Cervical Spine 1 View   Narrative FINDINGS  CLINICAL DATA:  HERNIATED DISC AT C3-4 C-ARM FLUOROSCOPY: C-ARM FLUOROSCOPY WAS USED IN THE OPERATING ROOM FOR 30-60 MINUTES. IMPRESSION C-ARM FLUOROSCOPY. CERVICAL SPINE:FOUR LATERAL IMAGES OF THE CERVICAL SPINE WERE SUBMITTED FOR INTERPRETATION.  ON THE INITIAL IMAGES, THE ANTERIOR ORTHOPEDIC LOCALIZERS ARE AT THE C3-4 AND C5-6 LEVELS.  ON THE SECOND IMAGE, THE LOCALIZERS ARE AT THE C5-6 AND C6-7 LEVELS.  THE THIRD AND FOURTH IMAGES REVEAL ANTERIOR ORTHOPEDIC FIXATION OF C3 TO C4 AND C6 TO C7. IMPRESSION:INTRAOPERATIVE LATERAL CERVICAL SPINE IMAGES.   Cervical DG complete:  Results for orders placed during the hospital encounter of 08/06/16  DG Cervical Spine Complete   Narrative CLINICAL DATA:  Pain.  EXAM: CERVICAL SPINE -  COMPLETE 4+ VIEW  COMPARISON:  None.  FINDINGS: Anterior plate and screws are seen at C3-4 and C6-7. No traumatic malalignment identified. No fractures are noted. The prevertebral soft tissues and pre odontoid space are unremarkable. The lateral masses of C1 align with C2. The odontoid process is unremarkable. Multilevel neural foraminal narrowing on the right. No definitive neural foraminal narrowing on the left. Degenerative disc disease with anterior osteophytes at C4-5 and C5-6. No fractures are noted.  IMPRESSION: Postsurgical and degenerative changes. Neural foraminal narrowing on the right.   Electronically Signed   By: Dorise Bullion III M.D   On: 08/06/2016 15:37    Cervical DG Myelogram views:  Results for orders placed in visit on 11/01/00  DG Myelogram Cervical   Narrative FINDINGS CERVICAL MYELOGRAM & POST-MYELOGRAM CT: HISTORY:  LT. ARM & SHOULDER PAIN. MYELOGRAM: LUMBAR PUNCTURE WAS PERFORMED BY MYSELF AT L4-5 USING A 22 GAUGE SPINAL NEEDLE.  FLUID WAS CLEAR AND COLORLESS.  8 CC OMNIPAQUE 300  WAS INSTILLED IN THE SUBARACHNOID SPACE AND MANEUVERED INTO THE CERVICAL REGION.  AP, LATERAL AND OBLIQUE VIEWS DEMONSTRATE AN EXTRADURAL DEFECT AT C6-7 ON THE LEFT.  A MORE SUBTLE DEFECT IS SEEN AT C3-4 ON THE LEFT. IMPRESSION EXTRADURAL DEFECT AT C6-7, LEFT, WITH MORE SUBTLE DEFECT SEEN ALSO AT C3-4 ON THE LEFT. POST-MYELOGRAM CT: DISC SPACES WERE EXAMINED AS FOLLOWS: C3-4:  LARGE UNCINATE SPUR, LEFT.  LEFT C-4 NERVE ROOT ENCROACHMENT IS PRESENT. C4-5:  NORMAL INTERSPACE. C5-6:  NORMAL INTERSPACE. C6-7:  HARD AND SOFT DISC CENTRAL AND TO THE LEFT.  LEFT C-7 NERVE ROOT ENCROACHMENT IS PRESENT. C7-T1:  NORMAL INTERSPACE. IMPRESSION: 1.  HARD AND SOFT DISC AT C6-7 CENTRAL AND TO THE LEFT WITH LEFT C-7 NERVE ROOT ENCROACHMENT. 2.  MODERATE-SIZED UNCINATE SPUR AT C3-4, LEFT, WITH LEFT C-4 NERVE ROOT ENCROACHMENT.   Lumbosacral Imaging: Lumbar MR wo contrast:  Results for orders placed in visit on 08/10/16  MR [place]   Lumbar MR w/wo contrast:  Results for orders placed during the hospital encounter of 06/02/16  MR Lumbar Spine W Wo Contrast   Narrative CLINICAL DATA:  Three-month acute on chronic constant low back pain radiating to RIGHT posterior flank. Status post lumbar spine surgery June 2017. Assess neurogenic claudication.  EXAM: MRI LUMBAR SPINE WITHOUT AND WITH CONTRAST  TECHNIQUE: Multiplanar and multiecho pulse sequences of the lumbar spine were obtained without and with intravenous contrast.  CONTRAST:  6m MULTIHANCE GADOBENATE DIMEGLUMINE 529 MG/ML IV SOLN  COMPARISON:  MRI of the lumbar spine June 09, 2015  FINDINGS: SEGMENTATION: For the purposes of this report, the last well-formed intervertebral disc will be described as L5-S1.  ALIGNMENT: Maintenance of the lumbar lordosis. No malalignment.  VERTEBRAE:Vertebral bodies are intact. Status post L4-5 laminectomies. Severe new L1-2 disc height loss with extensive irregular bone marrow edema  and enhancement. Associated new bright STIR signal, and heterogeneously enhancing 17 mm soft tissue component extending into the prevertebral and LEFT paraspinal soft tissues deep to the iliopsoas muscle. Similar moderate to severe L4-5 disc height loss. Decreased T2 signal within the lumbar disc compatible with desiccation with similar L2-3 through L5-S1 mild subacute on chronic discogenic endplate changes.  CONUS MEDULLARIS: Conus medullaris terminates at L1-2 and demonstrates normal morphology and signal characteristics. Pops duration of the cauda equina L1 is similar with faint epidural enhancement from L1-2 at L2 predominately dorsal. No abnormal leptomeningeal enhancement or nerve root clumping.  PARASPINAL AND SOFT TISSUES: RIGHT greater than LEFT paraspinal  muscle denervation at and below the level surgical intervention.  DISC LEVELS:  L1-2: Similar broad-based disc bulge with new large LEFT subarticular to extraforaminal disc protrusion displacing the exited LEFT L1 nerve. Enhancing LEFT central annular fissure. Mild canal stenosis, lateral recess stenosis may affect the traversing RIGHT L2 nerve. Mild RIGHT, moderate LEFT neural foraminal narrowing.  L2-3: 4 mm similar broad-based disc bulge, moderate RIGHT and mild LEFT facet arthropathy and ligamentum flavum redundancy. Stable moderate canal stenosis with thecal sac effacement. Mild LEFT neural foraminal narrowing.  L3-4: Similar 4 mm broad-based disc bulge. New small RIGHT central disc protrusion. Moderate facet arthropathy and ligamentum flavum redundancy, 5 mm RIGHT facet synovial cyst extending the paraspinal soft tissues. New 4 mm migrated LEFT facet synovial cyst extending into the dorsal epidural space. Moderate canal stenosis. Mild to moderate bilateral neural foraminal narrowing. 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  L4-5: Laminectomies. Similar 5 mm broad-based  disc bulge and mild canal stenosis. Moderate facet arthropathy. Moderate RIGHT greater than LEFT neural foraminal narrowing.  L5-S1: Small broad-based disc bulge. Severe bilateral facet arthropathy. No canal stenosis. Similar 7 mm RIGHT facet synovial cyst extending into the superior aspect of the neural foramen. No neural foraminal narrowing.  IMPRESSION: New large LEFT L1-2 disc protrusion, though given large prevertebral component at this level, superimposed discitis osteomyelitis is possible. Recommend correlation with ESR and CRP.  Status post L4-5 laminectomies with new suspected L3-4 Baastrup's disease.  Similar moderate canal stenosis L2-3 and L3-4. Neural foraminal narrowing at all lumbar levels: Moderate at L1-2 and L4-5.   Electronically Signed   By: Elon Alas M.D.   On: 06/03/2016 04:11    Lumbar DG Bending views:  Results for orders placed during the hospital encounter of 08/06/16  DG Lumbar Spine Complete W/Bend   Narrative CLINICAL DATA:  Failed back surgery.  EXAM: LUMBAR SPINE - COMPLETE WITH BENDING VIEWS  COMPARISON:  MRI 06/02/2016  FINDINGS: Previously seen bony changes at the L1-2 disc space not appreciated by plain film. Sclerosis is noted in the endplates around the K1-6 disc space. Disc space narrowing at this level with spurring. No instability with flexion or extension. Diffuse degenerative disc disease and facet disease throughout the lumbar spine.  IMPRESSION: Degenerative disc disease changes at L1-2 and elsewhere throughout the lumbar spine. No instability with flexion or extension.   Electronically Signed   By: Rolm Baptise M.D.   On: 08/06/2016 15:32    Hip Imaging: Hip-R DG 2-3 views:  Results for orders placed during the hospital encounter of 08/06/16  DG HIP UNILAT W OR W/O PELVIS 2-3 VIEWS RIGHT   Narrative CLINICAL DATA:  Pain without trauma  EXAM: DG HIP (WITH OR WITHOUT PELVIS) 2-3V RIGHT  COMPARISON:   None.  FINDINGS: Degenerative changes seen in both hips. No fractures or dislocations. Degenerative changes seen in the lower lumbar spine. Contour abnormality to the distal most visualized portion of the right femur, likely in the right femoral diaphysis. This could be sequela of previous trauma or an underlying bony lesion. No other acute abnormalities.  IMPRESSION: Contour abnormality to the mid right femoral diaphysis could represent sequela of previous trauma or an underlying bony lesion. This is incompletely evaluated. Recommend clinical correlation and dedicated imaging as clinically warranted.  Mild degenerative changes in the right hip.   Electronically Signed   By: Dorise Bullion III M.D   On: 08/06/2016 15:40    Hip-L DG 2-3 views:  Results for orders placed during the hospital encounter of 08/06/16  DG HIP UNILAT W OR W/O PELVIS 2-3 VIEWS LEFT   Narrative CLINICAL DATA:  Failed back surgery with chronic back pain since 2017. Fall December 2017.  EXAM: DG HIP (WITH OR WITHOUT PELVIS) 2-3V LEFT  COMPARISON:  None.  FINDINGS: Degenerative changes seen in both hips without significant loss of joint space. No fracture or dislocation. Degenerative changes in lower lumbar spine. A calcification lateral right hip may be from previous trauma. No other acute abnormalities.  IMPRESSION: Degenerative changes in both hips.  No acute abnormalities.   Electronically Signed   By: Dorise Bullion III M.D   On: 08/06/2016 15:35    Knee Imaging: Knee-L DG 1-2 views:  Results for orders placed during the hospital encounter of 08/06/16  DG Knee 1-2 Views Left   Narrative CLINICAL DATA:  Pain without trauma.  Chronic pain.  EXAM: LEFT KNEE - 1-2 VIEW  COMPARISON:  None.  FINDINGS: Degenerative changes in all 3 compartments of the knee with significant joint space loss medially. No fracture or dislocation. Known joint effusion.  IMPRESSION: Tricompartmental  degenerative changes with significant joint space loss medially.   Electronically Signed   By: Dorise Bullion III M.D   On: 08/06/2016 15:41    Note: Results of ordered imaging test(s) reviewed and explained to patient in Layman's terms. Copy of results provided to patient  Meds  The patient has a current medication list which includes the following prescription(s): atorvastatin, carvedilol, vitamin d3, glipizide, glucose blood, hydrocodone-acetaminophen, ibuprofen, magnesium, metformin, methocarbamol, naphazoline, pantoprazole, spironolactone, and azilsartan-chlorthalidone.  Current Outpatient Prescriptions on File Prior to Visit  Medication Sig  . atorvastatin (LIPITOR) 40 MG tablet Take 1 tablet (40 mg total) by mouth daily.  . carvedilol (COREG) 6.25 MG tablet Take 1 tablet (6.25 mg total) by mouth 2 (two) times daily.  . Cholecalciferol (VITAMIN D3) 1000 units CAPS Take 1 capsule by mouth daily.  Marland Kitchen glipiZIDE (GLUCOTROL) 5 MG tablet Take 5 mg by mouth daily before breakfast.   . glucose blood (ONE TOUCH ULTRA TEST) test strip USE AS DIRECTED TWICE A DAY  . ibuprofen (ADVIL,MOTRIN) 800 MG tablet Take 1 tablet (800 mg total) by mouth 3 (three) times daily. (Patient taking differently: Take 800 mg by mouth as needed. )  . Magnesium 250 MG TABS Take 1 tablet by mouth daily.  . metFORMIN (GLUCOPHAGE) 850 MG tablet Take 850 mg by mouth 2 (two) times daily with a meal.  . methocarbamol (ROBAXIN) 500 MG tablet Take 500 mg by mouth 3 (three) times daily.   . naphazoline (NAPHCON) 0.1 % ophthalmic solution Place 1 drop into both eyes as needed.   . pantoprazole (PROTONIX) 40 MG tablet Take 1 tablet (40 mg total) by mouth daily.  Marland Kitchen spironolactone (ALDACTONE) 25 MG tablet Take 1 tablet (25 mg total) by mouth daily.   No current facility-administered medications on file prior to visit.    ROS  Constitutional: Denies any fever or chills Gastrointestinal: No reported hemesis, hematochezia,  vomiting, or acute GI distress Musculoskeletal: Denies any acute onset joint swelling, redness, loss of ROM, or weakness Neurological: No reported episodes of acute onset apraxia, aphasia, dysarthria, agnosia, amnesia, paralysis, loss of coordination, or loss of consciousness  Allergies  Howard Maxwell is allergic to codeine sulfate and loratadine.  [place]  Drug: Howard Maxwell  reports that he does not use drugs. Alcohol:  reports that he does not drink alcohol. Tobacco:  reports  that he quit smoking about 2 years ago. His smoking use included Cigarettes. He smoked 1.00 pack per day. He has never used smokeless tobacco. Medical:  has a past medical history of Blind; Chronic back pain; COPD (chronic obstructive pulmonary disease) ([place]); Coronary artery disease; Diabetes mellitus without complication ([place]); GERD (gastroesophageal reflux disease); Hypertension; Sinus problem; and Tobacco use. Family: Family history is unknown by patient.  Past Surgical History:  Procedure Laterality Date  . ANTERIOR CERVICAL DECOMP/DISCECTOMY FUSION    . BACK SURGERY    . CARDIAC CATHETERIZATION  12/07/2013   [facility]  . EYE SURGERY    . FEMUR FRACTURE SURGERY    . FOOT SURGERY Bilateral   . GALLBLADDER SURGERY    . NASAL RECONSTRUCTION     Constitutional Exam  General appearance: Well nourished, well developed, and well hydrated. In no apparent acute distress Vitals:   09/10/16 0901  BP: (!) 138/97  Pulse: 65  Resp: 16  Temp: 97.9 F (36.6 C)  SpO2: 97%  Weight: 250 lb (113.4 kg)  Height: _0  (1.778 m)   BMI Assessment: Estimated body mass index is 35.87 kg/m as calculated from the following:   Height as of this encounter: _1  (1.778 m).   Weight as of this encounter: 250 lb (113.4 kg).  BMI interpretation table: BMI level Category Range association with higher incidence of chronic pain  <18 kg/m2 Underweight   18.5-24.9 kg/m2 Ideal body weight   25-29.9 kg/m2 Overweight Increased incidence  by 20%  30-34.9 kg/m2 Obese (Class I) Increased incidence by 68%  35-39.9 kg/m2 Severe obesity (Class II) Increased incidence by 136%  >40 kg/m2 Extreme obesity (Class III) Increased incidence by 254%   BMI Readings from Last 4 Encounters:  09/10/16 35.87 kg/m  08/06/16 35.30 kg/m  06/12/16 36.16 kg/m  05/12/16 35.23 kg/m   Wt Readings from Last 4 Encounters:  09/10/16 250 lb (113.4 kg)  08/06/16 246 lb (111.6 kg)  06/12/16 252 lb (114.3 kg)  05/12/16 245 lb 8 oz (111.4 kg)  Psych/Mental status: Alert, oriented x 3 (person, place, & time)       Eyes: PERLA Respiratory: No evidence of acute respiratory distress  Cervical Spine Exam  Inspection: No masses, redness, or swelling Alignment: Symmetrical Functional ROM: Unrestricted ROM      Stability: No instability detected Muscle strength & Tone: Functionally intact Sensory: Unimpaired Palpation: No palpable anomalies              Upper Extremity (UE) Exam    Side: Right upper extremity  Side: Left upper extremity  Inspection: No masses, redness, swelling, or asymmetry. No contractures  Inspection: No masses, redness, swelling, or asymmetry. No contractures  Functional ROM: Unrestricted ROM          Functional ROM: Unrestricted ROM          Muscle strength & Tone: Functionally intact  Muscle strength & Tone: Functionally intact  Sensory: Unimpaired  Sensory: Unimpaired  Palpation: No palpable anomalies              Palpation: No palpable anomalies              Specialized Test(s): Deferred         Specialized Test(s): Deferred          Thoracic Spine Exam  Inspection: No masses, redness, or swelling Alignment: Symmetrical Functional ROM: Unrestricted ROM Stability: No instability detected Sensory: Unimpaired Muscle strength & Tone: No palpable anomalies  Lumbar Spine Exam  Inspection: No masses, redness, or swelling Alignment: Symmetrical Functional ROM: Decreased ROM, bilaterally Stability: No instability  detected Muscle strength & Tone: Functionally intact Sensory: Movement-associated pain Palpation: Complains of area being tender to palpation       Provocative Tests: Lumbar Hyperextension and rotation test: Positive bilaterally for facet joint pain. Patrick's Maneuver: evaluation deferred today                    Gait & Posture Assessment  Ambulation: Unassisted Gait: Relatively normal for age and body habitus Posture: WNL   Lower Extremity Exam    Side: Right lower extremity  Side: Left lower extremity  Inspection: No masses, redness, swelling, or asymmetry. No contractures  Inspection: No masses, redness, swelling, or asymmetry. No contractures  Functional ROM: Unrestricted ROM          Functional ROM: Unrestricted ROM          Muscle strength & Tone: Functionally intact  Muscle strength & Tone: Functionally intact  Sensory: Unimpaired  Sensory: Unimpaired  Palpation: No palpable anomalies  Palpation: No palpable anomalies   Assessment & Plan  Primary Diagnosis & Pertinent Problem List: The primary encounter diagnosis was Chronic low back pain (Location of Primary Source of Pain) (Bilateral) (R>L). 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  Visit Diagnosis: 1. Chronic low back pain (Location of Primary Source of Pain) (Bilateral) (R>L)   2. 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 Chronic hip pain (Location of Secondary source of pain) (Bilateral) (R>L)   6. Chronic neck pain  (Location of Tertiary source of pain) (Bilateral) (L>R)   7. Chronic pain syndrome   8. Long term prescription opiate use   9. Long-term use of high-risk medication   10. Opiate use (10 MME/Day)    Problems updated and reviewed during this visit: No problems updated. Problem-specific Plan(s): No problem-specific Assessment & Plan notes found for this encounter.  Assessment & plan notes cannot be loaded without a specified hospital service.  Plan of Care  Pharmacotherapy (Medications Ordered): Meds ordered this encounter  Medications  . HYDROcodone-acetaminophen (NORCO/VICODIN) 5-325 MG tablet    Sig: Take 1 tablet by mouth 2 (two) times daily.    Dispense:  60 tablet    Refill:  0    Fill one day early if pharmacy is closed on scheduled refill date. Do not fill until: 09/10/16 To last until: 10/10/16   Lab-work, procedure(s), and/or referral(s): Orders Placed This Encounter  Referrals  . Ambulatory referral to Neurosurgery  . Ambulatory referral to Infectious Disease   I believe he needs to be evaluated by a Neurosurgeon and an Infectious Diseases specialist to determine if further tests or treatments are needed for the patient's osteomyelitis/diskitis problem. Should they determine that this is not an infectious osteomyelitis/diskitis, then we will reconsider the possibility of interventional treatments for his low back pain. At this point, I will stay away from the area, until this is clarified. I do understand that his current CBC, CRP, & ESR results would argue against an  active infection, but we all know that this is no guarantee that there is not one. I'll need to know if a biopsy and culture, and/or a white blood cell scan may be needed. For this reason, the patient is being referred to those with the expertise to do and interpret them.   Pharmacotherapy: Opioid Analgesics: We'll take over management today. See above orders Membrane stabilizer: We have discussed the  possibility of optimizing this mode of therapy, if tolerated Muscle relaxant: We have discussed the possibility of a trial NSAID: We have discussed the possibility of a trial Other analgesic(s): To be determined at a later time   Interventional therapies: Planned, scheduled, and/or pending:    No procedures. Very high likelihood of L1-2 discitis and/osteomyelitis on bone scan.    Considering:   The first step is to identify to identify whether or not the patient is having discitis and/or osteomyelitis.  Diagnostic bilateral lumbar facet block  Possible bilateral lumbar facet RFA  Diagnostic L3-4 interspinous ligament block  Possible L3-4 bilateral medial branch RFA  Diagnostic right sided L2-3 and L3-4 lumbar epidural steroid injection  Diagnostic bilateral L1-2 & L4-5 transforaminal epidural steroid injection  Diagnostic caudal epidural steroid injection + diagnostic epidurogram  Possible Racz procedure  Diagnostic bilateral intra-articular hip joint injection  Diagnostic bilateral femoral nerve and obturator nerve block  Possible bilateral femoral nerve and obturator nerve RFA  Diagnostic left cervical epidural steroid injection  Diagnostic bilateral cervical facet block  Possible bilateral cervical facet RFA  Diagnostic left intra-articular knee injection with local anesthetic and steroid  Possible series of 5 left intra-articular Hyalgan knee injection  Diagnostic left Genicular nerve block  Possible left Genicular nerve radiofrequency ablation  Possible intrathecal opioid trial    PRN Procedures:   To be determined at a later time   Provider-requested follow-up: Return in about 1 month (around 10/10/2016) for Med-Mgmt, (1 mo), w/ MD.  Future Appointments Date Time Provider [place]  10/08/2016 1:00 PM Milinda Pointer, MD [facility] None    Primary Care Physician: Tracie Harrier, MD Location: [facility] Note by: Kathlen Brunswick.  Dossie Arbour, M.D, DABA, DABAPM, DABPM, DABIPP, FIPP Date: 09/10/2016; Time: 10:01 AM  Patient instructions provided during this appointment: Patient Instructions    ____________________________________________________________________________________________  Medication Rules  Applies to: All patients receiving prescriptions (written or electronic).  Pharmacy of record: Pharmacy where electronic prescriptions will be sent. If written prescriptions are taken to a different pharmacy, please inform the nursing staff. The pharmacy listed in the electronic medical record should be the one where you would like electronic prescriptions to be sent.  Prescription refills: Only during scheduled appointments. Applies to both, written and electronic prescriptions.  NOTE: The following applies primarily to controlled substances (Opioid Pain Medications)  Patient's responsibilities: 1. Pain Pills: Bring all pain pills to every appointment (except for procedure appointments). 2. Pill Bottles: Bring pills in original pharmacy bottle. Always bring newest bottle. Bring bottle, even if empty. 3. Medication refills: You are responsible for knowing and keeping track of what medications you need refilled. The day before your appointment, write a list of all prescriptions that need to be refilled. Bring that list to your appointment and give it to the admitting nurse. Prescriptions will be written only during appointments. If you forget a medication, it will not be "Called in", "Faxed", or "electronically sent". You will need to get another appointment to get these prescribed. 4. Prescription Accuracy: You are responsible for carefully inspecting your  prescriptions before leaving our office. Have the discharge nurse carefully go over each prescription with you, before taking them home. Make sure that your name is accurately spelled, that your address is correct. Check the name and dose of your medication to make sure it  is accurate. Check the number of pills, and the written instructions to make sure they are clear and accurate. Make sure that you are given enough medication to last until your next medication refill appointment. 5. Taking Medication: Take medication as prescribed. Never take more pills than instructed. Never take medication more frequently than prescribed. Taking less pills or less frequently is permitted and encouraged, when it comes to controlled substances (written prescriptions).  6. Inform other Doctors: Always inform, all of your healthcare providers, of all the medications you take. 7. Pain Medication from other Providers: You are not allowed to accept any additional pain medication from any other Doctor or Healthcare provider. There are two exceptions to this rule. (see below) In the event that you require additional pain medication, you are responsible for notifying us, as stated below. 8. Medication Agreement: You are responsible for carefully reading and following our Medication Agreement. This must be signed before receiving any prescriptions from our practice. Safely store a copy of your signed Agreement. Violations to the Agreement will result in no further prescriptions. (Additional copies of our Medication Agreement are available upon request.) 9. Laws, Rules, & Regulations: All patients are expected to follow all Federal and [employer], [employer], Rules, [employer]. Ignorance of the Laws does not constitute a valid excuse.  Exceptions: There are only two exceptions to the rule of not receiving pain medications from other Healthcare Providers. 1. Exception #1 (Emergencies): In the event of an emergency (i.e.: accident requiring emergency care), you are allowed to receive additional pain medication. However, you are responsible for: As soon as you are able, call our office [phone], at any time of the day or night, and leave a message stating your name, the date and nature of the  emergency, and the name and dose of the medication prescribed. In the event that your call is answered by a member of our staff, make sure to document and save the date, time, and the name of the person that took your information.  2. Exception #2 (Planned Surgery): In the event that you are scheduled by another doctor or dentist to have any type of surgery or procedure, you are allowed (for a period no longer than 30 days), to receive additional pain medication, for the acute post-op pain. However, in this case, you are responsible for picking up a copy of our "Post-op Pain Management for Surgeons" handout, and giving it to your surgeon or dentist. This document is available at our office, and does not require an appointment to obtain it. Simply go to our office during business hours (Monday-Thursday from 8:00 AM to 4:00 PM) (Friday 8:00 AM to 12:00 Noon) or if you have a scheduled appointment with [place], prior to your surgery, and ask for it by name. In addition, you will need to provide [place] with your name, name of your surgeon, type of surgery, and date of procedure or surgery.  ____________________________________________________________________________________________  ____________________________________________________________________________________________  Appointment Policy Summary  It is our goal and responsibility to provide the medical community with assistance in the evaluation and management of patients with chronic pain. Unfortunately our resources are limited. Because we do not have an unlimited amount of time, or available appointments, we are  required to closely monitor and manage their use. The following rules exist to maximize their use:  Patient's responsibilities: 1. Punctuality: You are required to be physically present and registered in our facility at least 30 minutes before your appointment. 2. Tardiness: The cutoff is your appointment time. If you have an appointment scheduled  for 10:00 AM and you arrive at 10:01, you will be required to reschedule your appointment.  3. Plan ahead: Always assume that you will encounter traffic on your way in. Plan for it. If you are dependent on a driver, make sure they understand these rules and the need to arrive early. 4. Other appointments and responsibilities: Avoid scheduling any other appointments before or after your pain clinic appointments.  5. Be prepared: Write down everything that you need to discuss with your healthcare provider and give this information to the admitting nurse. Write down the medications that you will need refilled. Bring your pills and bottles (even the empty ones), to all of your appointments, except for those where a procedure is scheduled. 6. No children or pets: Find someone to take care of them. It is not appropriate to bring them in. 7. Scheduling changes: We request "advanced notification" of any changes or cancellations. 8. Advanced notification: Defined as a time period of more than 24 hours prior to the originally scheduled appointment. This allows for the appointment to be offered to other patients. 9. Rescheduling: When a visit is rescheduled, it will require the cancellation of the original appointment. For this reason they both fall within the category of "Cancellations".  10. Cancellations: They require advanced notification. Any cancellation less than 24 hours before the  appointment will be recorded as a "No Show". 11. No Show: Defined as an unkept appointment where the patient failed to notify or declare to the practice their intention or inability to keep the appointment.  Corrective process for repeat offenders:  1. Tardiness: Three (3) episodes of rescheduling due to late arrivals will be recorded as one (1) "No Show". 2. Cancellation or reschedule: Three (3) cancellations or rescheduling will be recorded as one (1) "No Show". 3. "No Shows": Three (3) "No Shows" within a 12 month period  will result in discharge from the practice.  ____________________________________________________________________________________________  ____________________________________________________________________________________________  Pain Scale  Introduction: The pain score used by this practice is the Verbal Numerical Rating Scale (VNRS-11). This is an 11-point scale. It is for adults and children 10 years or older. There are significant differences in how the pain score is reported, used, and applied. Forget everything you learned in the past and learn this scoring system.  General Information: The scale should reflect your current level of pain. Unless you are specifically asked for the level of your worst pain, or your average pain. If you are asked for one of these two, then it should be understood that it is over the past 24 hours.  Basic Activities of Daily Living (ADL): Personal hygiene, dressing, eating, transferring, and using restroom.  Instructions: Most patients tend to report their level of pain as a combination of two factors, their physical pain and their psychosocial pain. This last one is also known as "suffering" and it is reflection of how physical pain affects you socially and psychologically. From now on, report them separately. From this point on, when asked to report your pain level, report only your physical pain. Use the following table for reference.  Pain Clinic Pain Levels (0-5/10)  Pain Level Score  Description  No Pain  0   Mild pain 1 Nagging, annoying, but does not interfere with basic activities of daily living (ADL). Patients are able to eat, bathe, get dressed, toileting (being able to get on and off the toilet and perform personal hygiene functions), transfer (move in and out of bed or a chair without assistance), and maintain continence (able to control bladder and bowel functions). Blood pressure and heart rate are unaffected. A normal heart rate for a healthy  adult ranges from 60 to 100 bpm (beats per minute).   Mild to moderate pain 2 Noticeable and distracting. Impossible to hide from other people. More frequent flare-ups. Still possible to adapt and function close to normal. It can be very annoying and may have occasional stronger flare-ups. With discipline, patients may get used to it and adapt.   Moderate pain 3 Interferes significantly with activities of daily living (ADL). It becomes difficult to feed, bathe, get dressed, get on and off the toilet or to perform personal hygiene functions. Difficult to get in and out of bed or a chair without assistance. Very distracting. With effort, it can be ignored when deeply involved in activities.   Moderately severe pain 4 Impossible to ignore for more than a few minutes. With effort, patients may still be able to manage work or participate in some social activities. Very difficult to concentrate. Signs of autonomic nervous system discharge are evident: dilated pupils (mydriasis); mild sweating (diaphoresis); sleep interference. Heart rate becomes elevated (>115 bpm). Diastolic blood pressure (lower number) rises above 100 mmHg. Patients find relief in laying down and not moving.   Severe pain 5 Intense and extremely unpleasant. Associated with frowning face and frequent crying. Pain overwhelms the senses.  Ability to do any activity or maintain social relationships becomes significantly limited. Conversation becomes difficult. Pacing back and forth is common, as getting into a comfortable position is nearly impossible. Pain wakes you up from deep sleep. Physical signs will be obvious: pupillary dilation; increased sweating; goosebumps; brisk reflexes; cold, clammy hands and feet; nausea, vomiting or dry heaves; loss of appetite; significant sleep disturbance with inability to fall asleep or to remain asleep. When persistent, significant weight loss is observed due to the complete loss of appetite and sleep  deprivation.  Blood pressure and heart rate becomes significantly elevated. Caution: If elevated blood pressure triggers a pounding headache associated with blurred vision, then the patient should immediately seek attention at an urgent or emergency care unit, as these may be signs of an impending stroke.    Emergency Department Pain Levels (6-10/10)  Emergency Room Pain 6 Severely limiting. Requires emergency care and should not be seen or managed at an outpatient pain management facility. Communication becomes difficult and requires great effort. Assistance to reach the emergency department may be required. Facial flushing and profuse sweating along with potentially dangerous increases in heart rate and blood pressure will be evident.   Distressing pain 7 Self-care is very difficult. Assistance is required to transport, or use restroom. Assistance to reach the emergency department will be required. Tasks requiring coordination, such as bathing and getting dressed become very difficult.   Disabling pain 8 Self-care is no longer possible. At this level, pain is disabling. The individual is unable to do even the most "basic" activities such as walking, eating, bathing, dressing, transferring to a bed, or toileting. Fine motor skills are lost. It is difficult to think clearly.   Incapacitating pain 9 Pain becomes incapacitating. Thought processing is no longer possible. Difficult to  remember your own name. Control of movement and coordination are lost.   The worst pain imaginable 10 At this level, most patients pass out from pain. When this level is reached, collapse of the autonomic nervous system occurs, leading to a sudden drop in blood pressure and heart rate. This in turn results in a temporary and dramatic drop in blood flow to the brain, leading to a loss of consciousness. Fainting is one of the body's self defense mechanisms. Passing out puts the brain in a calmed state and causes it to shut down  for a while, in order to begin the healing process.    Summary: 1. Refer to this scale when providing [place] with your pain level. 2. Be accurate and careful when reporting your pain level. This will help with your care. 3. Over-reporting your pain level will lead to loss of credibility. 4. Even a level of 1/10 means that there is pain and will be treated at our facility. 5. High, inaccurate reporting will be documented as "Symptom Exaggeration", leading to loss of credibility and suspicions of possible secondary gains such as obtaining more narcotics, or wanting to appear disabled, for fraudulent reasons. 6. Only pain levels of 5 or below will be seen at our facility. 7. Pain levels of 6 and above will be sent to the Emergency Department and the appointment cancelled. _______________________________________  NEED TO TAKE VITAMIN D AND CALICUM DAILY- CAN BUY OVER THE COUNTER  rEFERRAL TO NEUROSURGERY AND INFECTIOUS DISESES_____________________________________________________

## 2016-09-14 ENCOUNTER — Telehealth: Payer: Self-pay | Admitting: *Deleted

## 2016-09-14 NOTE — Telephone Encounter (Signed)
Patient instructed to contact Medical Records Dept and request records be sent.

## 2016-09-25 ENCOUNTER — Other Ambulatory Visit: Payer: Self-pay | Admitting: Infectious Diseases

## 2016-09-25 ENCOUNTER — Other Ambulatory Visit: Payer: Self-pay | Admitting: Student

## 2016-09-25 DIAGNOSIS — R937 Abnormal findings on diagnostic imaging of other parts of musculoskeletal system: Secondary | ICD-10-CM

## 2016-09-25 DIAGNOSIS — M4646 Discitis, unspecified, lumbar region: Secondary | ICD-10-CM

## 2016-09-25 DIAGNOSIS — M4626 Osteomyelitis of vertebra, lumbar region: Secondary | ICD-10-CM

## 2016-09-27 ENCOUNTER — Emergency Department
Admission: EM | Admit: 2016-09-27 | Discharge: 2016-09-27 | Disposition: A | Discharge status: Home / Self Care | Payer: Medicare Other | Attending: Emergency Medicine | Admitting: Emergency Medicine

## 2016-09-27 ENCOUNTER — Encounter: Payer: Self-pay | Admitting: Emergency Medicine

## 2016-09-27 ENCOUNTER — Emergency Department: Payer: Medicare Other

## 2016-09-27 DIAGNOSIS — Z87891 Personal history of nicotine dependence: Secondary | ICD-10-CM | POA: Diagnosis not present

## 2016-09-27 DIAGNOSIS — E119 Type 2 diabetes mellitus without complications: Secondary | ICD-10-CM | POA: Diagnosis not present

## 2016-09-27 DIAGNOSIS — Z79899 Other long term (current) drug therapy: Secondary | ICD-10-CM | POA: Diagnosis not present

## 2016-09-27 DIAGNOSIS — K219 Gastro-esophageal reflux disease without esophagitis: Secondary | ICD-10-CM | POA: Diagnosis not present

## 2016-09-27 DIAGNOSIS — I10 Essential (primary) hypertension: Secondary | ICD-10-CM | POA: Diagnosis not present

## 2016-09-27 DIAGNOSIS — I2510 Atherosclerotic heart disease of native coronary artery without angina pectoris: Secondary | ICD-10-CM | POA: Insufficient documentation

## 2016-09-27 DIAGNOSIS — Z7984 Long term (current) use of oral hypoglycemic drugs: Secondary | ICD-10-CM | POA: Diagnosis not present

## 2016-09-27 DIAGNOSIS — R0602 Shortness of breath: Secondary | ICD-10-CM | POA: Diagnosis not present

## 2016-09-27 DIAGNOSIS — J449 Chronic obstructive pulmonary disease, unspecified: Secondary | ICD-10-CM | POA: Insufficient documentation

## 2016-09-27 LAB — CBC
HCT: 35.7 % — ABNORMAL LOW (ref 40.0–52.0)
Hemoglobin: 11.9 g/dL — ABNORMAL LOW (ref 13.0–18.0)
MCH: 30.3 pg (ref 26.0–34.0)
MCHC: 33.4 g/dL (ref 32.0–36.0)
MCV: 90.8 fL (ref 80.0–100.0)
Platelets: 293 10*3/uL (ref 150–440)
RBC: 3.93 MIL/uL — ABNORMAL LOW (ref 4.40–5.90)
RDW: 15.5 % — ABNORMAL HIGH (ref 11.5–14.5)
WBC: 9.6 10*3/uL (ref 3.8–10.6)

## 2016-09-27 LAB — BASIC METABOLIC PANEL
Anion gap: 9 (ref 5–15)
BUN: 35 mg/dL — ABNORMAL HIGH (ref 6–20)
CO2: 28 mmol/L (ref 22–32)
Calcium: 9.3 mg/dL (ref 8.9–10.3)
Chloride: 100 mmol/L — ABNORMAL LOW (ref 101–111)
Creatinine, Ser: 0.94 mg/dL (ref 0.61–1.24)
GFR calc Af Amer: >60 mL/min (ref >60)
GFR calc non Af Amer: >60 mL/min (ref >60)
Glucose, Bld: 237 mg/dL — ABNORMAL HIGH (ref 65–99)
Potassium: 4.6 mmol/L (ref 3.5–5.1)
Sodium: 137 mmol/L (ref 135–145)

## 2016-09-27 LAB — TROPONIN I: Troponin I: <0.03 ng/mL (ref <0.03)

## 2016-09-27 MED ORDER — METOCLOPRAMIDE HCL 10 MG PO TABS: 10.0000 mg | ORAL_TABLET | Freq: Four times a day (QID) | ORAL | 0 refills | Status: DC | PRN

## 2016-09-27 MED ORDER — FAMOTIDINE 20 MG PO TABS: 20.0000 mg | ORAL_TABLET | Freq: Two times a day (BID) | ORAL | 0 refills | Status: DC

## 2016-09-27 NOTE — ED Provider Notes (Signed)
[facility] Emergency Department Provider Note  ____________________________________________  Time seen: Approximately 3:53 PM  I have reviewed the triage vital signs and the nursing notes.   HISTORY  Chief Complaint Shortness of Breath and Chest Pain    HPI Howard Maxwell is a 71 y.o. male who complains of shortness of breath that started this morning at about 8 AM. Not exertional. No cough. No fever or chills. He did have some sweats with this last night. Also reports having some central chest tightness which is nonradiating, not pleuritic or exertional. No aggravating or alleviating factors, constant, started also about 8 or 9 AM today. He has had this before and it was due to acid reflux. He stopped taking his Protonix about 6 weeks ago because he noticed that it was giving him leg cramps. His leg cramps have now resolved. No leg swelling.  No recent travel trauma hospitalizations or surgeries. He is scheduled to have a spinal aspiration tomorrow for evaluation of possible discitis. He is not currently on any antibiotics.      Past Medical History:  Diagnosis Date  . Blind    one eye left  . Chronic back pain   . COPD (chronic obstructive pulmonary disease) ([place])   . Coronary artery disease    Cardiac catheterization in September of 2015 showed an occluded mid RCA which was medium in size and codominant. Normal ejection fraction.  . Diabetes mellitus without complication ([place])   . GERD (gastroesophageal reflux disease)   . Hypertension   . Sinus problem   . Tobacco use      Patient Active Problem List   Diagnosis Date Noted  . Discitis of lumbar region (L1-2) 08/06/2016  . 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 Musculoskeletal pain 08/06/2016  . Osteoarthritis of lumbar spine 08/06/2016  . Chronic low back pain (Location of Primary Source of Pain) (Bilateral) (R>L) 08/06/2016  . Chronic hip pain (Location of Secondary  source of pain) (Bilateral) (R>L) 08/06/2016  . Chronic neck pain (Location of Tertiary source of pain) (Bilateral) (L>R) 08/06/2016  . Chronic knee pain (Left) 08/06/2016  . 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) 08/06/2016  . Tricompartmental disease of knee (Left) 08/06/2016  . 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) 08/06/2016  . GERD (gastroesophageal reflux disease) 08/06/2016  . Vitamin D insufficiency 08/06/2016  . Long term (current) use of opiate analgesic 08/06/2016  . Long term prescription opiate use 08/06/2016  . Lumbar facet syndrome (Bilateral) (R>L) 08/06/2016  . Lumbar facet arthropathy (HCC) (Bilateral & Multilevel) (L2-3 to L5-S1) 08/06/2016  . Lumbar facet joint synovial cysts (Right: L3-4 & L5-S1 & Left: L3-4) 08/06/2016  . Diabetes mellitus type 2, uncomplicated ([place]) 82/99/3716  . Chronic pain syndrome 08/05/2016  . Opiate use (10 MME/Day) 08/05/2016  . Long-term use of high-risk medication 08/05/2016  . Abnormal MRI, lumbar spine 08/05/2016  . Abnormal MRI, cervical spine 08/05/2016  . 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 History of cervical spinal surgery (C3-4 and C6-7 ACDF) 08/05/2016  . BMI 35.0-35.9,adult 07/17/2016  . Lumbar DDD (degenerative disc disease) 07/17/2016  . Lumbar central spinal stenosis (L2-3 and L3-4) 07/17/2016  . PSVT (paroxysmal supraventricular tachycardia) ([place]) 01/15/2014  . Hyperlipidemia 12/14/2013  .  Coronary artery disease   . Unstable angina ([place]) 12/06/2013  . Hypertension   . Tobacco use      Past Surgical History:  Procedure Laterality Date  . ANTERIOR CERVICAL DECOMP/DISCECTOMY FUSION    . BACK  SURGERY    . CARDIAC CATHETERIZATION  12/07/2013   [facility]  . EYE SURGERY    . FEMUR FRACTURE SURGERY    . FOOT SURGERY Bilateral   . GALLBLADDER SURGERY    . NASAL RECONSTRUCTION       Prior to Admission medications   Medication Sig Start Date End Date Taking? Authorizing Provider  atorvastatin (LIPITOR) 40 MG tablet Take 1 tablet (40 mg total) by mouth daily. 06/12/16   Wellington Hampshire, MD  Azilsartan-Chlorthalidone (EDARBYCLOR) 40-25 MG TABS Take 1 tablet by mouth daily. 09/10/16   Wellington Hampshire, MD  carvedilol (COREG) 6.25 MG tablet Take 1 tablet (6.25 mg total) by mouth 2 (two) times daily. 09/08/16   Wellington Hampshire, MD  Cholecalciferol (VITAMIN D3) 1000 units CAPS Take 1 capsule by mouth daily.    [provider]  famotidine (PEPCID) 20 MG tablet Take 1 tablet (20 mg total) by mouth 2 (two) times daily. 09/27/16   Carrie Mew, MD  glipiZIDE (GLUCOTROL) 5 MG tablet Take 5 mg by mouth daily before breakfast.     [provider]  glucose blood (ONE TOUCH ULTRA TEST) test strip USE AS DIRECTED TWICE A DAY 06/18/16   [provider]  HYDROcodone-acetaminophen (NORCO/VICODIN) 5-325 MG tablet Take 1 tablet by mouth 2 (two) times daily. 09/10/16 10/10/16  Milinda Pointer, MD  ibuprofen (ADVIL,MOTRIN) 800 MG tablet Take 1 tablet (800 mg total) by mouth 3 (three) times daily. Patient taking differently: Take 800 mg by mouth as needed.  02/16/15   Gloriann Loan, PA-C  Magnesium 250 MG TABS Take 1 tablet by mouth daily.    [provider]  metFORMIN (GLUCOPHAGE) 850 MG tablet Take 850 mg by mouth 2 (two) times daily with a meal.    [provider]  methocarbamol (ROBAXIN) 500 MG tablet Take 500 mg by mouth 3 (three) times daily.  06/12/16   [provider]  metoCLOPramide (REGLAN) 10 MG tablet Take 1 tablet (10 mg total) by mouth every 6 (six) hours as needed. 09/27/16   Carrie Mew, MD  naphazoline [facility]) 0.1 % ophthalmic solution  Place 1 drop into both eyes as needed.     [provider]  pantoprazole (PROTONIX) 40 MG tablet Take 1 tablet (40 mg total) by mouth daily. 06/12/16   Wellington Hampshire, MD  spironolactone (ALDACTONE) 25 MG tablet Take 1 tablet (25 mg total) by mouth daily. 09/08/16 12/07/16  Wellington Hampshire, MD     Allergies Codeine sulfate and Loratadine   Family History  Problem Relation Age of Onset  . Family history unknown: Yes    Social History Social History  Substance Use Topics  . Smoking status: Former Smoker    Packs/day: 1.00    Types: Cigarettes    Quit date: 03/21/2014  . Smokeless tobacco: Never Used  . Alcohol use No    Review of Systems  Constitutional:   No fever or chills.  ENT:   No sore throat. No rhinorrhea. Cardiovascular:   Positive as above anterior chest tightness. No syncope. Respiratory:   Positive shortness of breath, no cough . Gastrointestinal:   Negative for abdominal pain, vomiting and diarrhea.  Musculoskeletal:   Positive chronic low back pain All  other systems reviewed and are negative except as documented above in ROS and HPI.  ____________________________________________   PHYSICAL EXAM:  VITAL SIGNS: ED Triage Vitals  Enc Vitals Group     BP 09/27/16 1308 106/72     Pulse Rate 09/27/16 1308 (!) 104     Resp 09/27/16 1308 20     Temp 09/27/16 1308 97.9 F (36.6 C)     Temp Source 09/27/16 1308 Oral     SpO2 09/27/16 1308 97 %     Weight 09/27/16 1308 255 lb (115.7 kg)     Height 09/27/16 1308 5\' 10"  (1.778 m)     Head Circumference --      Peak Flow --      Pain Score 09/27/16 1314 0     Pain Loc --      Pain Edu? --      Excl. in [place]? --     Vital signs reviewed, nursing assessments reviewed.   Constitutional:   Alert and oriented. Well appearing and in no distress. Eyes:   No scleral icterus.  EOMI. No nystagmus. No conjunctival pallor. PERRL. ENT   Head:   Normocephalic and atraumatic.   Nose:   No  congestion/rhinnorhea.    Mouth/Throat:   MMM, no pharyngeal erythema. No peritonsillar mass.    Neck:   No meningismus. Full ROM Hematological/Lymphatic/Immunilogical:   No cervical lymphadenopathy. Cardiovascular:   RRR, Heart rate 90 on my exam . Symmetric bilateral radial and DP pulses.  No murmurs.  Respiratory:   Normal respiratory effort without tachypnea/retractions. Breath sounds are clear and equal bilaterally. No wheezes/rales/rhonchi. Gastrointestinal:   Soft and nontender. Non distended. There is no CVA tenderness.  No rebound, rigidity, or guarding. Genitourinary:   deferred Musculoskeletal:   Normal range of motion in all extremities. No joint effusions.  No lower extremity tenderness.  No edema.No midline spinal tenderness. No low back tenderness. Chest wall nontender. Neurologic:   Normal speech and language.  Motor grossly intact. No gross focal neurologic deficits are appreciated.  Skin:    Skin is warm, dry and intact. No rash noted.  No petechiae, purpura, or bullae.  ____________________________________________    LABS (pertinent positives/negatives) (all labs ordered are listed, but only abnormal results are displayed) Labs Reviewed  BASIC METABOLIC PANEL - Abnormal; Notable for the following:       Result Value   Chloride 100 (*)    Glucose, Bld 237 (*)    BUN 35 (*)    All other components within normal limits  CBC - Abnormal; Notable for the following:    RBC 3.93 (*)    Hemoglobin 11.9 (*)    HCT 35.7 (*)    RDW 15.5 (*)    All other components within normal limits  TROPONIN I   ____________________________________________   EKG  Interpreted by me Sinus rhythm rate of 96, normal axis. First-degree AV block. Normal QRS ST segments and T waves.  ____________________________________________    RADIOLOGY  Dg Chest 2 View  Result Date: 09/27/2016 CLINICAL DATA:  Shortness of breath with nausea. EXAM: CHEST  2 VIEW COMPARISON:  Chest CT  04/15/2016.  Chest x-ray 04/15/2016. FINDINGS: Cardiopericardial silhouette is at upper limits of normal for size. Bilateral pleural thickening again noted and unchanged in the interval. No focal airspace consolidation. No pulmonary edema or pleural effusion. Lungs appear hyperexpanded. IMPRESSION: Stable exam. Bilateral pleural thickening and hyperexpansion. No acute cardiopulmonary findings. Electronically Signed   By: Verda Cumins.D.  On: 09/27/2016 13:54    ____________________________________________   PROCEDURES Procedures  ____________________________________________   INITIAL IMPRESSION / ASSESSMENT AND PLAN / ED COURSE  Pertinent labs & imaging results that were available during my care of the patient were reviewed by me and considered in my medical decision making (see chart for details).  Patient well appearing no acute distress, complains of shortness of breath and atypical chest pain which feels identical to acid reflux in the past. He did discontinue his antacid 6 weeks ago. He has close follow-up with his doctor and infectious disease and is getting a aspiration of possible discitis tomorrow. In this setting, there is no clear infectious source, I would not cover him with antibiotics presumptively for fear of confounding his upcoming test for diagnosis of his spinal condition. Encourage continued close follow-up with his primary care doctor and monitoring his symptoms. Return to the ED if his symptoms worsen.  Considering the patient's symptoms, medical history, and physical examination today, I have low suspicion for ACS, PE, TAD, pneumothorax, carditis, mediastinitis, pneumonia, CHF, or sepsis.        ____________________________________________   FINAL CLINICAL IMPRESSION(S) / ED DIAGNOSES  Final diagnoses:  Shortness of breath  Gastroesophageal reflux disease, esophagitis presence not specified      New Prescriptions   FAMOTIDINE (PEPCID) 20 MG TABLET     Take 1 tablet (20 mg total) by mouth 2 (two) times daily.   METOCLOPRAMIDE (REGLAN) 10 MG TABLET    Take 1 tablet (10 mg total) by mouth every 6 (six) hours as needed.     Portions of this note were generated with dragon dictation software. Dictation errors may occur despite best attempts at proofreading.    Carrie Mew, MD 09/27/16 [phone]

## 2016-09-27 NOTE — ED Triage Notes (Signed)
Pt arrived via POV with c/o shortness breath, feeling faint and nausea that started this morning.  Pt states he is having difficulty taking in a deep breath.  Pt c/o some chest tightness which he has had before.  Denies a cough but states he has had a cold sweats.

## 2016-09-28 ENCOUNTER — Ambulatory Visit: Payer: Medicare Other

## 2016-09-28 ENCOUNTER — Ambulatory Visit: Admission: RE | Admit: 2016-09-28 | Payer: Medicare Other | Source: Ambulatory Visit

## 2016-09-30 ENCOUNTER — Other Ambulatory Visit: Payer: Self-pay | Admitting: Neurological Surgery

## 2016-09-30 DIAGNOSIS — M5126 Other intervertebral disc displacement, lumbar region: Secondary | ICD-10-CM

## 2016-10-05 ENCOUNTER — Telehealth: Payer: Self-pay

## 2016-10-05 ENCOUNTER — Encounter: Payer: Self-pay | Admitting: Pain Medicine

## 2016-10-05 NOTE — Telephone Encounter (Signed)
Juliann Pulse, could you please reschedule Howard Maxwell appointment until after his MRI on 10-13-26?  He sent an email and requested to move the appointment that he has on 10-07-16 since his MRi was scheduled for 10-12-16. Would you please call patient and let him know.  Thank you.

## 2016-10-06 ENCOUNTER — Ambulatory Visit: Payer: Medicare Other | Admitting: Pain Medicine

## 2016-10-08 ENCOUNTER — Ambulatory Visit: Payer: Medicare Other | Admitting: Pain Medicine

## 2016-10-12 ENCOUNTER — Ambulatory Visit
Admission: RE | Admit: 2016-10-12 | Discharge: 2016-10-12 | Disposition: A | Discharge status: Home / Self Care | Payer: Medicare Other | Source: Ambulatory Visit | Attending: Neurological Surgery | Admitting: Neurological Surgery

## 2016-10-12 DIAGNOSIS — M5126 Other intervertebral disc displacement, lumbar region: Secondary | ICD-10-CM

## 2016-10-12 MED ORDER — GADOBENATE DIMEGLUMINE 529 MG/ML IV SOLN
20.0000 mL | Freq: Once | INTRAVENOUS | Status: AC | PRN
  Administered 2016-10-12: 20 mL via INTRAVENOUS

## 2016-10-12 NOTE — Progress Notes (Signed)
Patient's Name: Howard Maxwell  MRN: [id number]  Referring Provider: Tracie Harrier, MD  DOB: [birth year]  PCP: Tracie Harrier, MD  DOS: 10/13/2016  Note by: Gaspar Cola, MD  Service setting: Ambulatory outpatient  Specialty: Interventional Pain Management  Location: [facility] (AMB) Pain Management Facility    Patient type: Established   Primary Reason(s) for Visit: Encounter for prescription drug management. (Level of risk: moderate)  CC: Back Pain (lower)  HPI  Howard Maxwell is a 71 y.o. year old, male patient, who comes today for a medication management evaluation. 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); History of cervical spinal surgery (C3-4 and C6-7 ACDF); Discitis of lumbar region (L1-2); 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); Musculoskeletal pain; Osteoarthritis of lumbar spine; Chronic low back pain (Location of Primary Source of Pain) (Bilateral) (R>L); Chronic hip pain (Location of Secondary source of pain) (Bilateral) (R>L); Chronic neck pain (Location of Tertiary source of pain) (Bilateral) (L>R); Chronic knee pain (Left); 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, C5-6, C6-7; 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) (s/p ACDF); 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); 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); Tricompartmental disease of knee (Left); 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 His primarily concern today is the Back Pain (lower)  Pain Assessment: Location: Lower Back Radiating: right hip Onset: More than a month ago Duration: Chronic pain Quality: Throbbing Severity: 2 /10 (self-reported pain score)  Note: Reported level is compatible with observation.                   Effect on ADL:   Timing: Constant Modifying factors: lying on right side in fetal position, rest  Howard Maxwell was last scheduled for an appointment on 09/10/2016 for medication management. During today's appointment we reviewed Howard Maxwell chronic pain status, as well as his outpatient medication regimen. The patient returns to the clinics today for follow-up evaluation after having been seen by Dr. [name] (neurosurgeon). He had an MRI of the lumbar spine done yesterday and today we have gone over some of the results. To start with, the MRI would suggest that the patient does not have any evidence of ongoing infection. This means that we should be able to start treating him with interventional techniques. The MRI also states that he has some severe spinal stenosis at the L2-3 level, as well as some moderate spinal stenosis at the L3-4 level. The patient comes in today indicating that even though the MRI would suggest that he is improving, as he reads it, he does not feel like he is. He has gone now to using a cane and he has been experiencing lower extremity weakness. This is likely to be secondary to the severe spinal stenosis. With spinal stenosis at the L2-3 level  this can affect his lower  back and lower extremities. Today we will go ahead and schedule him to come back for a diagnostic bilateral L2-3 transforaminal epidural steroid injection under fluoroscopic guidance and IV sedation. Today we spoke about the steroids and their side effects and how it could affect his diabetes. We also talked about his medications and how he should not be taking ibuprofen and meloxicam since both are nonsteroidal anti-inflammatory drugs. Today I have given him some information regarding preemptive analgesia so that he can use this technique whenever he goes out to mow the yard. I have increased the dose on his magnesium so as to take advantage of the muscle relaxant effects of this over-the-counter medication. Today I also talked to the patient about the side effects and the magnesium and how it can give him diarrhea. At the same time, since that opioids can give him constipation, they may balance each other. He indicates that he was taken the hydrocodone, but it was not controlling his pain well and he was having to supplemented by taking 600 mg of ibuprofen twice a day. He does have a prior history of gastritis and because of his diabetes, he has increase risk of renal problems. Because of this I will encourage him to discontinue using the ibuprofen and meloxicam on a regular basis. He still has permission to use it on a when necessary basis. In addition, we will discontinue the hydrocodone and switch him to a more potent opioid that does not contain Tylenol. Today I have gone from a 10 MME/Day to a 30 MME/Day. According to our records at one point he was taking as much as 88 MME's.  The patient  reports that he does not use drugs. His body mass index is 36.59 kg/m.  Further details on both, my assessment(s), as well as the proposed treatment plan, please see below.  Controlled Substance Pharmacotherapy Assessment REMS (Risk Evaluation and Mitigation Strategy)  Current Analgesic: Hydrocodone/APAP 5/325 one  tablet by mouth twice a day (filled on 07/17/2016) Highest recorded MME/day:74m/day Current MME/day:143mday WhLandis MartinsRN  10/13/2016  1:03 PM  Sign at close encounter Nursing Pain Medication Assessment:  Safety precautions to be maintained throughout the outpatient stay will include: orient to surroundings, keep bed in low position, maintain call bell within reach at all times, provide assistance with transfer out of bed and ambulation.  Medication Inspection Compliance: Pill count conducted under aseptic conditions, in front of the patient. Neither the pills nor the bottle was removed from the patient's sight at any time. Once count was completed pills were immediately returned to the patient in their original bottle.  Medication: Hydrocodone/APAP Pill/Patch Count: 2 of 60 pills remain Pill/Patch Appearance: Markings consistent with prescribed medication Bottle Appearance: Standard pharmacy container. Clearly labeled. Filled Date:06/26/ 2018 Last Medication intake:  Today    Future Analgesic: Oxycodone IR 5 mg by mouth every 6 hours (20 mg/day of oxycodone) (30 MME/Day) Future MME/day: 30 mg/day.  Pharmacokinetics: Liberation and absorption (onset of action): WNL Distribution (time to peak effect): WNL Metabolism and excretion (duration of action): WNL         Pharmacodynamics: Desired effects: Analgesia: Mr. MiMasellieports >50% benefit. Functional ability: Patient reports that medication allows him to accomplish basic ADLs Clinically meaningful improvement in function (CMIF): Sustained CMIF goals met Perceived effectiveness: Described as relatively effective, allowing for increase in activities of daily living (ADL) Undesirable effects: Side-effects or Adverse reactions: None reported Monitoring: [place] PMP: Online review of  the past 61-monthperiod conducted. Compliant with practice rules and regulations List of all UDS test(s) done:  Lab Results  Component Value Date     SUMMARY FINAL 08/06/2016   Last UDS on record: Summary  Date Value Ref Range Status  08/06/2016 FINAL  Final    Comment:    ==================================================================== TOXASSURE COMP DRUG ANALYSIS,UR ==================================================================== Test                             Result       Flag       Units Drug Present and Declared for Prescription Verification   Hydrocodone                    252          EXPECTED   ng/mg creat   Norhydrocodone                 345          EXPECTED   ng/mg creat    Sources of hydrocodone include scheduled prescription    medications. Norhydrocodone is an expected metabolite of    hydrocodone.   Methocarbamol                  PRESENT      EXPECTED Drug Absent but Declared for Prescription Verification   Acetaminophen                  Not Detected UNEXPECTED    Acetaminophen, as indicated in the declared medication list, is    not always detected even when used as directed.   Ibuprofen                      Not Detected UNEXPECTED    Ibuprofen, as indicated in the declared medication list, is not    always detected even when used as directed. ==================================================================== Test                      Result    Flag   Units      Ref Range   Creatinine              60               mg/dL      >=20 ==================================================================== Declared Medications:  The flagging and interpretation on this report are based on the  following declared medications.  Unexpected results may arise from  inaccuracies in the declared medications.  **Note: The testing scope of this panel includes these medications:  Hydrocodone (Hydrocodone-Acetaminophen)  Methocarbamol  **Note: The testing scope of this panel does not include small to  moderate amounts of these reported medications:  Acetaminophen (Hydrocodone-Acetaminophen)  Ibuprofen  **Note: The  testing scope of this panel does not include following  reported medications:  Atorvastatin  Azilsartan  Carvedilol  Chlorthalidone  Cholecalciferol  Eye Drops  Glipizide  Magnesium  Metformin  Pantoprazole  Potassium  Spironolactone ==================================================================== For clinical consultation, please call ([phone] ====================================================================    UDS interpretation: Compliant          Medication Assessment Form: Reviewed. Patient indicates being compliant with therapy Treatment compliance: Compliant Risk Assessment Profile: Aberrant behavior: See prior evaluations. None observed or detected today Comorbid factors increasing risk of overdose: See prior notes. No additional risks detected today Risk of substance use disorder (SUD): Low Opioid Risk Tool (  ORT) Total Score: 0  Interpretation Table:  Score <3 = Low Risk for SUD  Score between 4-7 = Moderate Risk for SUD  Score >8 = High Risk for Opioid Abuse   Risk Mitigation Strategies:  Patient Counseling: Covered Patient-Prescriber Agreement (PPA): Present and active  Notification to other healthcare providers: Done  Pharmacologic Plan: No change in therapy, at this time  Laboratory Chemistry  Inflammation Markers (CRP: Acute Phase) (ESR: Chronic Phase) Lab Results  Component Value Date   CRP <0.8 08/06/2016   ESRSEDRATE 10 08/06/2016                 Renal Function Markers Lab Results  Component Value Date   BUN 35 (H) 09/27/2016   CREATININE 0.94 09/27/2016   GFRAA >60 09/27/2016   GFRNONAA >60 09/27/2016                 Hepatic Function Markers Lab Results  Component Value Date   AST 28 08/06/2016   ALT 30 08/06/2016   ALBUMIN 4.2 08/06/2016   ALKPHOS 71 08/06/2016                 Electrolytes Lab Results  Component Value Date   NA 137 09/27/2016   K 4.6 09/27/2016   CL 100 (L) 09/27/2016   CALCIUM 9.3 09/27/2016    MG 1.7 08/06/2016                 Neuropathy Markers Lab Results  Component Value Date   VITAMINB12 209 08/06/2016                 Bone Pathology Markers Lab Results  Component Value Date   ALKPHOS 71 08/06/2016   25OHVITD1 29 (L) 08/06/2016   25OHVITD2 <1.0 08/06/2016   25OHVITD3 29 08/06/2016   CALCIUM 9.3 09/27/2016                 Coagulation Parameters Lab Results  Component Value Date   INR 1.0 01/12/2014   LABPROT 13.3 01/12/2014   APTT 33.9 01/12/2014   PLT 293 09/27/2016                 Cardiovascular Markers Lab Results  Component Value Date   BNP 319 (H) 01/12/2014   HGB 11.9 (L) 09/27/2016   HCT 35.7 (L) 09/27/2016                 Note: Lab results reviewed.  Recent Diagnostic Imaging Review  No results found. Note: Imaging results reviewed.          Meds   Current Meds  Medication Sig  . atorvastatin (LIPITOR) 40 MG tablet Take 1 tablet (40 mg total) by mouth daily.  . Azilsartan-Chlorthalidone (EDARBYCLOR) 40-25 MG TABS Take 1 tablet by mouth daily.  . carvedilol (COREG) 6.25 MG tablet Take 1 tablet (6.25 mg total) by mouth 2 (two) times daily.  . Cholecalciferol (VITAMIN D3) 1000 units CAPS Take 1 capsule by mouth daily.  . famotidine (PEPCID) 20 MG tablet Take 1 tablet (20 mg total) by mouth 2 (two) times daily.  Marland Kitchen glipiZIDE (GLUCOTROL) 5 MG tablet Take 5 mg by mouth daily before breakfast.   . glucose blood (ONE TOUCH ULTRA TEST) test strip USE AS DIRECTED TWICE A DAY  . ibuprofen (ADVIL,MOTRIN) 800 MG tablet Take 1 tablet (800 mg total) by mouth 3 (three) times daily. (Patient taking differently: Take 800 mg by mouth as needed. )  . Magnesium 250 MG TABS Take  1 tablet by mouth daily.  . Magnesium Oxide 500 MG CAPS Take 1 capsule (500 mg total) by mouth 2 (two) times daily at 8 am and 10 pm.  . metFORMIN (GLUCOPHAGE) 850 MG tablet Take 850 mg by mouth 2 (two) times daily with a meal.  . methocarbamol (ROBAXIN) 500 MG tablet Take 500 mg by  mouth 3 (three) times daily.   . naphazoline (NAPHCON) 0.1 % ophthalmic solution Place 1 drop into both eyes as needed.   Marland Kitchen oxyCODONE (OXY IR/ROXICODONE) 5 MG immediate release tablet Take 1 tablet (5 mg total) by mouth every 6 (six) hours as needed for severe pain.  Marland Kitchen spironolactone (ALDACTONE) 25 MG tablet Take 1 tablet (25 mg total) by mouth daily.    ROS  Constitutional: Denies any fever or chills Gastrointestinal: No reported hemesis, hematochezia, vomiting, or acute GI distress Musculoskeletal: Denies any acute onset joint swelling, redness, loss of ROM, or weakness Neurological: No reported episodes of acute onset apraxia, aphasia, dysarthria, agnosia, amnesia, paralysis, loss of coordination, or loss of consciousness  Allergies  Mr. Thornhill is allergic to loratadine.  Anton  Drug: Mr. Markus  reports that he does not use drugs. Alcohol:  reports that he does not drink alcohol. Tobacco:  reports that he quit smoking about 2 years ago. His smoking use included Cigarettes. He smoked 1.00 pack per day. He has never used smokeless tobacco. Medical:  has a past medical history of Blind; Chronic back pain; COPD (chronic obstructive pulmonary disease) ([place]); Coronary artery disease; Diabetes mellitus without complication ([place]); GERD (gastroesophageal reflux disease); Hypertension; Sinus problem; and Tobacco use. Surgical: Mr. Gilkeson  has a past surgical history that includes Foot surgery (Bilateral); Anterior cervical decomp/discectomy fusion; Gallbladder surgery; Femur fracture surgery; Eye surgery; Nasal reconstruction; Cardiac catheterization (12/07/2013); and Back surgery. Family: Family history is unknown by patient.  Constitutional Exam  General appearance: Well nourished, well developed, and well hydrated. In no apparent acute distress Vitals:   10/13/16 1253  BP: 127/66  Pulse: 64  Resp: 16  Temp: 98.3 F (36.8 C)  TempSrc: Oral  SpO2: 96%  Weight: 255 lb (115.7 kg)    Height: _0  (1.778 m)   BMI Assessment: Estimated body mass index is 36.59 kg/m as calculated from the following:   Height as of this encounter: _1  (1.778 m).   Weight as of this encounter: 255 lb (115.7 kg).  BMI interpretation table: BMI level Category Range association with higher incidence of chronic pain  <18 kg/m2 Underweight   18.5-24.9 kg/m2 Ideal body weight   25-29.9 kg/m2 Overweight Increased incidence by 20%  30-34.9 kg/m2 Obese (Class I) Increased incidence by 68%  35-39.9 kg/m2 Severe obesity (Class II) Increased incidence by 136%  >40 kg/m2 Extreme obesity (Class III) Increased incidence by 254%   BMI Readings from Last 4 Encounters:  10/13/16 36.59 kg/m  09/27/16 36.59 kg/m  09/10/16 35.87 kg/m  08/06/16 35.30 kg/m   Wt Readings from Last 4 Encounters:  10/13/16 255 lb (115.7 kg)  09/27/16 255 lb (115.7 kg)  09/10/16 250 lb (113.4 kg)  08/06/16 246 lb (111.6 kg)  Psych/Mental status: Alert, oriented x 3 (person, place, & time)       Eyes: PERLA Respiratory: No evidence of acute respiratory distress  Cervical Spine Exam  Inspection: No masses, redness, or swelling Alignment: Symmetrical Functional ROM: Unrestricted ROM      Stability: No instability detected Muscle strength & Tone: Functionally intact Sensory: Unimpaired Palpation: No palpable anomalies  Upper Extremity (UE) Exam    Side: Right upper extremity  Side: Left upper extremity  Inspection: No masses, redness, swelling, or asymmetry. No contractures  Inspection: No masses, redness, swelling, or asymmetry. No contractures  Functional ROM: Unrestricted ROM          Functional ROM: Unrestricted ROM          Muscle strength & Tone: Functionally intact  Muscle strength & Tone: Functionally intact  Sensory: Unimpaired  Sensory: Unimpaired  Palpation: No palpable anomalies              Palpation: No palpable anomalies              Specialized Test(s): Deferred          Specialized Test(s): Deferred          Thoracic Spine Exam  Inspection: No masses, redness, or swelling Alignment: Symmetrical Functional ROM: Unrestricted ROM Stability: No instability detected Sensory: Unimpaired Muscle strength & Tone: No palpable anomalies  Lumbar Spine Exam  Inspection: No masses, redness, or swelling Alignment: Symmetrical Functional ROM: Unrestricted ROM      Stability: No instability detected Muscle strength & Tone: Functionally intact Sensory: Unimpaired Palpation: No palpable anomalies       Provocative Tests: Lumbar Hyperextension and rotation test: evaluation deferred today       Lumbar Lateral bending test: evaluation deferred today       Patrick's Maneuver: evaluation deferred today                    Gait & Posture Assessment  Ambulation: Patient ambulates using a cane Gait: Very limited, using assistive device to ambulate Posture: WNL   Lower Extremity Exam    Side: Right lower extremity  Side: Left lower extremity  Inspection: No masses, redness, swelling, or asymmetry. No contractures  Inspection: No masses, redness, swelling, or asymmetry. No contractures  Functional ROM: Unrestricted ROM          Functional ROM: Unrestricted ROM          Muscle strength & Tone: L2 weakness  Muscle strength & Tone: L2 weakness  Sensory: Dermatomal pain pattern  Sensory: Dermatomal pain pattern  Palpation: No palpable anomalies  Palpation: No palpable anomalies   Assessment  Primary Diagnosis & Pertinent Problem List: The primary encounter diagnosis was Chronic low back pain (Location of Primary Source of Pain) (Bilateral) (R>L). 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  Status Diagnosis  Worsening Worsening Unimproved 1. Chronic low back pain (Location of Primary Source of Pain) (Bilateral) (R>L)   2. Chronic hip pain (Location of Secondary source of pain) (Bilateral) (R>L)   3. Chronic neck pain (Location of Tertiary source of pain) (Bilateral) (L>R)   4. Lumbar facet syndrome (Bilateral) (R>L)   5. Lumbar facet joint synovial cysts (Right: L3-4 & L5-S1 & Left: L3-4)   6. Chronic pain syndrome   7. Musculoskeletal pain   8. Gastroesophageal reflux disease without esophagitis   9. Lumbar central spinal stenosis (L2-3  and L3-4)   10. 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 Lumbar radiculopathy (Bilateral)   12. Weakness of proximal end of lower extremity (Bilateral)   13. BMI 35.0-35.9,adult   14. Abnormal MRI, lumbar spine     Problems updated and reviewed during this visit: Problem  Lumbar radiculopathy (Bilateral)  Weakness of proximal end of lower extremity (Bilateral)  Abnormal MRI, lumbar spine (10/13/16)   FINDINGS: Alignment:  2 mm anterolisthesis L5-S1.  Mild dextroscoliosis. Vertebrae: Negative for fracture. Asymmetric bone marrow edema and enhancement on the left at L1-2 has improved since the prior study. This is most consistent with asymmetric disc degeneration with discogenic edema. Infection not considered likely. Conus medullaris: Extends to the L1-2 level and appears normal. Paraspinal and other soft tissues: Negative for mass or abscess Disc levels: T12-L1:  Mild disc degeneration L1-2: Asymmetric bone marrow edema and enhancement on the left has improved. Right-sided disc shows mild degenerative change.  Anterior disc protrusion and spurring unchanged. Posterolateral disc protrusion and spurring on the left is similar to the prior study causing subarticular and foraminal stenosis on the left. Moderate spinal stenosis unchanged. Posterior epidural enhancement seen on the prior study has resolved. No abscess. L2-3: Diffuse disc bulging and endplate spurring. Moderate facet hypertrophy. Moderate to severe spinal stenosis unchanged from the prior study. Subarticular stenosis bilaterally L3-4: Disc degeneration with diffuse disc bulging and endplate spurring. Bilateral facet hypertrophy. Moderate spinal stenosis unchanged. Small posterior synovial cyst on the left no longer visualized. L4-5: Diffuse disc bulging and endplate spurring. Posterior laminectomy. Subarticular stenosis bilaterally unchanged. L5-S1: 2 mm anterolisthesis with facet degeneration. Negative for disc protrusion or stenosis.  IMPRESSION: Asymmetric bone marrow edema enhancement on the left at L1-2 has improved and is felt to be related to degenerative change. Posterolateral disc protrusion and spurring on the left similar to the prior study. Moderate spinal stenosis unchanged. No evidence of infection. Moderate to severe spinal stenosis L2-3 unchanged Moderate spinal stenosis L3-4 unchanged   Bmi 35.0-35.9,Adult   The patient was instructed to lower his BMI to less than 30. In his case this would mean a weight of less than 205 pounds.    Plan of Care  Pharmacotherapy (Medications Ordered): Meds ordered this encounter  Medications  . oxyCODONE (OXY IR/ROXICODONE) 5 MG immediate release tablet    Sig: Take 1 tablet (5 mg total) by mouth every 6 (six) hours as needed for severe pain.    Dispense:  120 tablet    Refill:  0    Do not place this medication, or any other prescription from our practice, on "Automatic Refill". Patient may have prescription filled one day early if pharmacy is closed on scheduled refill date. Do not fill  until: 10/13/16 To last until: 11/12/16  . Magnesium Oxide 500 MG CAPS    Sig: Take 1 capsule (500 mg total) by mouth 2 (two) times daily at 8 am and 10 pm.    Dispense:  180 capsule    Refill:  0    Do not place medication on "Automatic Refill". Fill one day early if pharmacy is closed on scheduled refill date.   New Prescriptions   MAGNESIUM OXIDE 500 MG CAPS    Take 1 capsule (500 mg total) by mouth 2 (two) times daily at 8 am and 10 pm.   OXYCODONE (OXY IR/ROXICODONE) 5 MG IMMEDIATE RELEASE TABLET    Take 1 tablet (5 mg total) by mouth every 6 (six) hours as needed  for severe pain.   Medications administered today: Mr. [place] had no medications administered during this visit.  Lab-work, procedure(s), and/or referral(s): Orders Placed This Encounter  Procedures  . Lumbar Transforaminal Epidural  . NCV with EMG(electromyography)    Interventional management options: Planned, scheduled, and/or pending:   Diagnostic bilateral L2-3 transforaminal epidural steroid injection under fluoroscopic guidance and IV sedation    Considering:   The first step is to identify to identify whether or not the patient is having discitis and/or osteomyelitis.  Diagnostic bilateral lumbar facetblock  Possible bilateral lumbar facet RFA Diagnostic L3-4 interspinous ligament block Possible L3-4 bilateral medial branch RFA Diagnostic right sided L2-3 and L3-4 lumbar epiduralsteroid injection  Diagnostic bilateral L1-2 &L4-5 transforaminalepidural steroid injection  Diagnostic bilateral L2-3 transforaminal epidural steroid injection  Diagnostic caudal epidural steroid injection + diagnostic epidurogram Possible Racz procedure Diagnostic bilateral intra-articular hipjoint injection  Diagnostic bilateral femoral nerve and obturator nerveblock  Possible bilateral femoral nerve and obturator nerve RFA Diagnostic left cervical epidural steroid injection  Diagnostic bilateral cervical facet  block Possible bilateral cervical facet RFA Diagnosticleft intra-articular knee injectionwith local anesthetic and steroid  Possible series of 5 left intra-articular Hyalganknee injection  Diagnostic left Genicular nerve block Possible left Genicular nerve radiofrequencyablation  Possible intrathecal opioid trial   Palliative PRN treatment(s):   None at this time.    Provider-requested follow-up: Return for procedure (w/ sedation), (ASAP), by MD, in addition, Med-Mgmt, w/ MD.  Future Appointments Date Time Provider [place]  10/22/2016 8:00 AM Milinda Pointer, MD [facility] None  11/04/2016 8:45 AM Milinda Pointer, MD [facility] None  12/11/2016 10:40 AM Wellington Hampshire, MD [facility]   Primary Care Physician: Tracie Harrier, MD Location: [facility] Note by: Gaspar Cola, MD Date: 10/13/2016; Time: 2:33 PM  Patient Instructions   ____________________________________________________________________________________________  Preparing for Procedure with Sedation Instructions: . Oral Intake: Do not eat or drink anything for at least 8 hours prior to your procedure. . Transportation: Public transportation is not allowed. Bring an adult driver. The driver must be physically present in our waiting room before any procedure can be started. Marland Kitchen Physical Assistance: Bring an adult physically capable of assisting you, in the event you need help. This adult should keep you company at home for at least 6 hours after the procedure. . Blood Pressure Medicine: Take your blood pressure medicine with a sip of water the morning of the procedure. . Blood thinners:  . Diabetics on insulin: Notify the staff so that you can be scheduled 1st case in the morning. If your diabetes requires high dose insulin, take only  of your normal insulin dose the morning of the procedure and notify the staff that you have done so. . Preventing  infections: Shower with an antibacterial soap the morning of your procedure. . Build-up your immune system: Take 1000 mg of Vitamin C with every meal (3 times a day) the day prior to your procedure. Marland Kitchen Antibiotics: Inform the staff if you have a condition or reason that requires you to take antibiotics before dental procedures. . Pregnancy: If you are pregnant, call and cancel the procedure. . Sickness: If you have a cold, fever, or any active infections, call and cancel the procedure. . Arrival: You must be in the facility at least 30 minutes prior to your scheduled procedure. . Children: Do not bring children with you. . Dress appropriately: Bring dark clothing that you would not mind if they get stained. . Valuables: Do  not bring any jewelry or valuables. Procedure appointments are reserved for interventional treatments only. Marland Kitchen No Prescription Refills. . No medication changes will be discussed during procedure appointments. . No disability issues will be discussed. ____________________________________________________________________________________________  ____________________________________________________________________________________________  Pain Prevention Technique  Definition:   A technique used to minimize the effects of an activity known to cause inflammation or swelling, which in turn leads to an increase in pain.  Purpose: To prevent swelling from occurring. It is based on the fact that it is easier to prevent swelling from happening than it is to get rid of it, once it occurs.  Contraindications: 1. Anyone with allergy or hypersensitivity to the recommended medications. 2. Anyone taking anticoagulants (Blood Thinners) (e.g., Coumadin, Warfarin, Plavix, etc.). 3. Patients in Renal Failure.  Technique: Before you undertake an activity known to cause pain, or a flare-up of your chronic pain, and before you experience any pain, do the following:  1. On a full stomach,  take 4 (four) over the counter Ibuprofens 230m tablets (Motrin), for a total of 800 mg. 2. In addition, take over the counter Magnesium 400 to 500 mg, before doing the activity.  3. Six (6) hours later, again on a full stomach, repeat the Ibuprofen. 4. That night, take a warm shower and stretch under the running warm water.  This technique may be sufficient to abort the pain and discomfort before it happens. Keep in mind that it takes a lot less medication to prevent swelling than it takes to eliminate it once it occurs.  ____________________________________________________________________________________________  Pain Management Discharge Instructions  General Discharge Instructions :  If you need to reach your doctor call: Monday-Friday 8:00 am - 4:00 pm at [phone] toll free [phone]  After clinic hours [phone] have operator reach doctor.  Bring all of your medication bottles to all your appointments in the pain clinic.  To cancel or reschedule your appointment with Pain Management please remember to call 24 hours in advance to avoid a fee.  Refer to the educational materials which you have been given on: General Risks, I had my Procedure. Discharge Instructions, Post Sedation.  Post Procedure Instructions:  The drugs you were given will stay in your system until tomorrow, so for the next 24 hours you should not drive, make any legal decisions or drink any alcoholic beverages.  You may eat anything you prefer, but it is better to start with liquids then soups and crackers, and gradually work up to solid foods.  Please notify your doctor immediately if you have any unusual bleeding, trouble breathing or pain that is not related to your normal pain.  Depending on the type of procedure that was done, some parts of your body may feel week and/or numb.  This usually clears up by tonight or the next day.  Walk with the use of an assistive device or accompanied by an  adult for the 24 hours.  You may use ice on the affected area for the first 24 hours.  Put ice in a Ziploc bag and cover with a towel and place against area 15 minutes on 15 minutes off.  You may switch to heat after 24 hours.GENERAL RISKS AND COMPLICATIONS  What are the risk, side effects and possible complications? Generally speaking, most procedures are safe.  However, with any procedure there are risks, side effects, and the possibility of complications.  The risks and complications are dependent upon the sites that are lesioned, or the type of nerve block to  be performed.  The closer the procedure is to the spine, the more serious the risks are.  Great care is taken when placing the radio frequency needles, block needles or lesioning probes, but sometimes complications can occur. 1. Infection: Any time there is an injection through the skin, there is a risk of infection.  This is why sterile conditions are used for these blocks.  There are four possible types of infection. 1. Localized skin infection. 2. Central Nervous System Infection-This can be in the form of Meningitis, which can be deadly. 3. Epidural Infections-This can be in the form of an epidural abscess, which can cause pressure inside of the spine, causing compression of the spinal cord with subsequent paralysis. This would require an emergency surgery to decompress, and there are no guarantees that the patient would recover from the paralysis. 4. Discitis-This is an infection of the intervertebral discs.  It occurs in about 1% of discography procedures.  It is difficult to treat and it may lead to surgery.        2. Pain: the needles have to go through skin and soft tissues, will cause soreness.       3. Damage to internal structures:  The nerves to be lesioned may be near blood vessels or    other nerves which can be potentially damaged.       4. Bleeding: Bleeding is more common if the patient is taking blood thinners such as    aspirin, Coumadin, Ticiid, Plavix, etc., or if he/she have some genetic predisposition  such as hemophilia. Bleeding into the spinal canal can cause compression of the spinal  cord with subsequent paralysis.  This would require an emergency surgery to  decompress and there are no guarantees that the patient would recover from the  paralysis.       5. Pneumothorax:  Puncturing of a lung is a possibility, every time a needle is introduced in  the area of the chest or upper back.  Pneumothorax refers to free air around the  collapsed lung(s), inside of the thoracic cavity (chest cavity).  Another two possible  complications related to a similar event would include: Hemothorax and Chylothorax.   These are variations of the Pneumothorax, where instead of air around the collapsed  lung(s), you may have blood or chyle, respectively.       6. Spinal headaches: They may occur with any procedures in the area of the spine.       7. Persistent CSF (Cerebro-Spinal Fluid) leakage: This is a rare problem, but may occur  with prolonged intrathecal or epidural catheters either due to the formation of a fistulous  track or a dural tear.       8. Nerve damage: By working so close to the spinal cord, there is always a possibility of  nerve damage, which could be as serious as a permanent spinal cord injury with  paralysis.       9. Death:  Although rare, severe deadly allergic reactions known as "Anaphylactic  reaction" can occur to any of the medications used.      10. Worsening of the symptoms:  We can always make thing worse.  What are the chances of something like this happening? Chances of any of this occuring are extremely low.  By statistics, you have more of a chance of getting killed in a motor vehicle accident: while driving to the hospital than any of the above occurring .  Nevertheless, you should be aware  that they are possibilities.  In general, it is similar to taking a shower.  Everybody knows that you can  slip, hit your head and get killed.  Does that mean that you should not shower again?  Nevertheless always keep in mind that statistics do not mean anything if you happen to be on the wrong side of them.  Even if a procedure has a 1 (one) in a 1,000,000 (million) chance of going wrong, it you happen to be that one..Also, keep in mind that by statistics, you have more of a chance of having something go wrong when taking medications.  Who should not have this procedure? If you are on a blood thinning medication (e.g. Coumadin, Plavix, see list of "Blood Thinners"), or if you have an active infection going on, you should not have the procedure.  If you are taking any blood thinners, please inform your physician.  How should I prepare for this procedure?  Do not eat or drink anything at least six hours prior to the procedure.  Bring a driver with you .  It cannot be a taxi.  Come accompanied by an adult that can drive you back, and that is strong enough to help you if your legs get weak or numb from the local anesthetic.  Take all of your medicines the morning of the procedure with just enough water to swallow them.  If you have diabetes, make sure that you are scheduled to have your procedure done first thing in the morning, whenever possible.  If you have diabetes, take only half of your insulin dose and notify our nurse that you have done so as soon as you arrive at the clinic.  If you are diabetic, but only take blood sugar pills (oral hypoglycemic), then do not take them on the morning of your procedure.  You may take them after you have had the procedure.  Do not take aspirin or any aspirin-containing medications, at least eleven (11) days prior to the procedure.  They may prolong bleeding.  Wear loose fitting clothing that may be easy to take off and that you would not mind if it got stained with Betadine or blood.  Do not wear any jewelry or perfume  Remove any nail coloring.  It will  interfere with some of our monitoring equipment.  NOTE: Remember that this is not meant to be interpreted as a complete list of all possible complications.  Unforeseen problems may occur.  BLOOD THINNERS The following drugs contain aspirin or other products, which can cause increased bleeding during surgery and should not be taken for 2 weeks prior to and 1 week after surgery.  If you should need take something for relief of minor pain, you may take acetaminophen which is found in Tylenol,m Datril, Anacin-3 and Panadol. It is not blood thinner. The products listed below are.  Do not take any of the products listed below in addition to any listed on your instruction sheet.  A.P.C or A.P.C with Codeine Codeine Phosphate Capsules #3 Ibuprofen Ridaura  ABC compound Congesprin Imuran rimadil  Advil Cope Indocin Robaxisal  Alka-Seltzer Effervescent Pain Reliever and Antacid Coricidin or Coricidin-D  Indomethacin Rufen  Alka-Seltzer plus Cold Medicine Cosprin Ketoprofen S-A-C Tablets  Anacin Analgesic Tablets or Capsules Coumadin Korlgesic Salflex  Anacin Extra Strength Analgesic tablets or capsules CP-2 Tablets Lanoril Salicylate  Anaprox Cuprimine Capsules Levenox Salocol  Anexsia-D Dalteparin Magan Salsalate  Anodynos Darvon compound Magnesium Salicylate Sine-off  Ansaid Dasin Capsules Magsal Sodium  Salicylate  Anturane Depen Capsules Marnal Soma  APF Arthritis pain formula Dewitt's Pills Measurin Stanback  Argesic Dia-Gesic Meclofenamic Sulfinpyrazone  Arthritis Bayer Timed Release Aspirin Diclofenac Meclomen Sulindac  Arthritis pain formula Anacin Dicumarol Medipren Supac  Analgesic (Safety coated) Arthralgen Diffunasal Mefanamic Suprofen  Arthritis Strength Bufferin Dihydrocodeine Mepro Compound Suprol  Arthropan liquid Dopirydamole Methcarbomol with Aspirin Synalgos  ASA tablets/Enseals Disalcid Micrainin Tagament  Ascriptin Doan's Midol Talwin  Ascriptin A/D Dolene Mobidin Tanderil    Ascriptin Extra Strength Dolobid Moblgesic Ticlid  Ascriptin with Codeine Doloprin or Doloprin with Codeine Momentum Tolectin  Asperbuf Duoprin Mono-gesic Trendar  Aspergum Duradyne Motrin or Motrin IB Triminicin  Aspirin plain, buffered or enteric coated Durasal Myochrisine Trigesic  Aspirin Suppositories Easprin Nalfon Trillsate  Aspirin with Codeine Ecotrin Regular or Extra Strength Naprosyn Uracel  Atromid-S Efficin Naproxen Ursinus  Auranofin Capsules Elmiron Neocylate Vanquish  Axotal Emagrin Norgesic Verin  Azathioprine Empirin or Empirin with Codeine Normiflo Vitamin E  Azolid Emprazil Nuprin Voltaren  Bayer Aspirin plain, buffered or children's or timed BC Tablets or powders Encaprin Orgaran Warfarin Sodium  Buff-a-Comp Enoxaparin Orudis Zorpin  Buff-a-Comp with Codeine Equegesic Os-Cal-Gesic   Buffaprin Excedrin plain, buffered or Extra Strength Oxalid   Bufferin Arthritis Strength Feldene Oxphenbutazone   Bufferin plain or Extra Strength Feldene Capsules Oxycodone with Aspirin   Bufferin with Codeine Fenoprofen Fenoprofen Pabalate or Pabalate-SF   Buffets II Flogesic Panagesic   Buffinol plain or Extra Strength Florinal or Florinal with Codeine Panwarfarin   Buf-Tabs Flurbiprofen Penicillamine   Butalbital Compound Four-way cold tablets Penicillin   Butazolidin Fragmin Pepto-Bismol   Carbenicillin Geminisyn Percodan   Carna Arthritis Reliever Geopen Persantine   Carprofen Gold's salt Persistin   Chloramphenicol Goody's Phenylbutazone   Chloromycetin Haltrain Piroxlcam   Clmetidine heparin Plaquenil   Cllnoril Hyco-pap Ponstel   Clofibrate Hydroxy chloroquine Propoxyphen         Before stopping any of these medications, be sure to consult the physician who ordered them.  Some, such as Coumadin (Warfarin) are ordered to prevent or treat serious conditions such as "deep thrombosis", "pumonary embolisms", and other heart problems.  The amount of time that you may need off  of the medication may also vary with the medication and the reason for which you were taking it.  If you are taking any of these medications, please make sure you notify your pain physician before you undergo any procedures.          Epidural Steroid Injection An epidural steroid injection is a shot of steroid medicine and numbing medicine that is given into the space between the spinal cord and the bones in your back (epidural space). The shot helps relieve pain caused by an irritated or swollen nerve root. The amount of pain relief you get from the injection depends on what is causing the nerve to be swollen and irritated, and how long your pain lasts. You are more likely to benefit from this injection if your pain is strong and comes on suddenly rather than if you have had pain for a long time. Tell a health care provider about:  Any allergies you have.  All medicines you are taking, including vitamins, herbs, eye drops, creams, and over-the-counter medicines.  Any problems you or family members have had with anesthetic medicines.  Any blood disorders you have.  Any surgeries you have had.  Any medical conditions you have.  Whether you are pregnant or may be pregnant. What are the risks? Generally, this  is a safe procedure. However, problems may occur, including:  Headache.  Bleeding.  Infection.  Allergic reaction to medicines.  Damage to your nerves.  What happens before the procedure? Staying hydrated Follow instructions from your health care provider about hydration, which may include:  Up to 2 hours before the procedure - you may continue to drink clear liquids, such as water, clear fruit juice, black coffee, and plain tea.  Eating and drinking restrictions Follow instructions from your health care provider about eating and drinking, which may include:  8 hours before the procedure - stop eating heavy meals or foods such as meat, fried foods, or fatty  foods.  6 hours before the procedure - stop eating light meals or foods, such as toast or cereal.  6 hours before the procedure - stop drinking milk or drinks that contain milk.  2 hours before the procedure - stop drinking clear liquids.  Medicine  You may be given medicines to lower anxiety.  Ask your health care provider about: ? Changing or stopping your regular medicines. This is especially important if you are taking diabetes medicines or blood thinners. ? Taking medicines such as aspirin and ibuprofen. These medicines can thin your blood. Do not take these medicines before your procedure if your health care provider instructs you not to. General instructions  Plan to have someone take you home from the hospital or clinic. What happens during the procedure?  You may receive a medicine to help you relax (sedative).  You will be asked to lie on your abdomen.  The injection site will be cleaned.  A numbing medicine (local anesthetic) will be used to numb the injection site.  A needle will be inserted through your skin into the epidural space. You may feel some discomfort when this happens. An X-ray machine will be used to make sure the needle is put as close as possible to the affected nerve.  A steroid medicine and a local anesthetic will be injected into the epidural space.  The needle will be removed.  A bandage (dressing) will be put over the injection site. What happens after the procedure?  Your blood pressure, heart rate, breathing rate, and blood oxygen level will be monitored until the medicines you were given have worn off.  Your arm or leg may feel weak or numb for a few hours.  The injection site may feel sore.  Do not drive for 24 hours if you received a sedative. This information is not intended to replace advice given to you by your health care provider. Make sure you discuss any questions you have with your health care provider. Document Released:  06/16/2007 Document Revised: 08/21/2015 Document Reviewed: 06/25/2015 Elsevier Interactive Patient Education  2017 [employer].

## 2016-10-13 ENCOUNTER — Encounter: Payer: Self-pay | Admitting: Pain Medicine

## 2016-10-13 ENCOUNTER — Ambulatory Visit: Payer: Medicare Other | Attending: Pain Medicine | Admitting: Pain Medicine

## 2016-10-13 VITALS — BP 127/66 | HR 64 | Temp 98.3°F | Resp 16 | Ht 70.0 in | Wt 255.0 lb

## 2016-10-13 DIAGNOSIS — M545 Low back pain, unspecified: Secondary | ICD-10-CM

## 2016-10-13 DIAGNOSIS — H547 Unspecified visual loss: Secondary | ICD-10-CM | POA: Insufficient documentation

## 2016-10-13 DIAGNOSIS — M25551 Pain in right hip: Secondary | ICD-10-CM | POA: Diagnosis not present

## 2016-10-13 DIAGNOSIS — G8929 Other chronic pain: Secondary | ICD-10-CM

## 2016-10-13 DIAGNOSIS — M791 Myalgia: Secondary | ICD-10-CM | POA: Diagnosis not present

## 2016-10-13 DIAGNOSIS — E119 Type 2 diabetes mellitus without complications: Secondary | ICD-10-CM | POA: Insufficient documentation

## 2016-10-13 DIAGNOSIS — M4826 Kissing spine, lumbar region: Secondary | ICD-10-CM | POA: Insufficient documentation

## 2016-10-13 DIAGNOSIS — Z87891 Personal history of nicotine dependence: Secondary | ICD-10-CM | POA: Diagnosis not present

## 2016-10-13 DIAGNOSIS — M5116 Intervertebral disc disorders with radiculopathy, lumbar region: Secondary | ICD-10-CM | POA: Insufficient documentation

## 2016-10-13 DIAGNOSIS — I25110 Atherosclerotic heart disease of native coronary artery with unstable angina pectoris: Secondary | ICD-10-CM | POA: Insufficient documentation

## 2016-10-13 DIAGNOSIS — I471 Supraventricular tachycardia: Secondary | ICD-10-CM | POA: Insufficient documentation

## 2016-10-13 DIAGNOSIS — G894 Chronic pain syndrome: Secondary | ICD-10-CM | POA: Diagnosis not present

## 2016-10-13 DIAGNOSIS — R937 Abnormal findings on diagnostic imaging of other parts of musculoskeletal system: Secondary | ICD-10-CM | POA: Diagnosis not present

## 2016-10-13 DIAGNOSIS — I10 Essential (primary) hypertension: Secondary | ICD-10-CM | POA: Insufficient documentation

## 2016-10-13 DIAGNOSIS — M5416 Radiculopathy, lumbar region: Secondary | ICD-10-CM | POA: Diagnosis not present

## 2016-10-13 DIAGNOSIS — R531 Weakness: Secondary | ICD-10-CM | POA: Insufficient documentation

## 2016-10-13 DIAGNOSIS — M7918 Myalgia, other site: Secondary | ICD-10-CM

## 2016-10-13 DIAGNOSIS — Z5181 Encounter for therapeutic drug level monitoring: Secondary | ICD-10-CM | POA: Diagnosis not present

## 2016-10-13 DIAGNOSIS — M25552 Pain in left hip: Secondary | ICD-10-CM | POA: Diagnosis not present

## 2016-10-13 DIAGNOSIS — M4696 Unspecified inflammatory spondylopathy, lumbar region: Secondary | ICD-10-CM

## 2016-10-13 DIAGNOSIS — M7138 Other bursal cyst, other site: Secondary | ICD-10-CM | POA: Diagnosis not present

## 2016-10-13 DIAGNOSIS — M961 Postlaminectomy syndrome, not elsewhere classified: Secondary | ICD-10-CM | POA: Insufficient documentation

## 2016-10-13 DIAGNOSIS — M9983 Other biomechanical lesions of lumbar region: Secondary | ICD-10-CM

## 2016-10-13 DIAGNOSIS — Z79891 Long term (current) use of opiate analgesic: Secondary | ICD-10-CM | POA: Insufficient documentation

## 2016-10-13 DIAGNOSIS — M25559 Pain in unspecified hip: Secondary | ICD-10-CM | POA: Diagnosis not present

## 2016-10-13 DIAGNOSIS — M542 Cervicalgia: Secondary | ICD-10-CM | POA: Diagnosis not present

## 2016-10-13 DIAGNOSIS — E559 Vitamin D deficiency, unspecified: Secondary | ICD-10-CM | POA: Insufficient documentation

## 2016-10-13 DIAGNOSIS — M4802 Spinal stenosis, cervical region: Secondary | ICD-10-CM | POA: Insufficient documentation

## 2016-10-13 DIAGNOSIS — M1712 Unilateral primary osteoarthritis, left knee: Secondary | ICD-10-CM | POA: Insufficient documentation

## 2016-10-13 DIAGNOSIS — K219 Gastro-esophageal reflux disease without esophagitis: Secondary | ICD-10-CM | POA: Diagnosis not present

## 2016-10-13 DIAGNOSIS — M488X6 Other specified spondylopathies, lumbar region: Secondary | ICD-10-CM | POA: Insufficient documentation

## 2016-10-13 DIAGNOSIS — Z6835 Body mass index (BMI) 35.0-35.9, adult: Secondary | ICD-10-CM

## 2016-10-13 DIAGNOSIS — M160 Bilateral primary osteoarthritis of hip: Secondary | ICD-10-CM | POA: Insufficient documentation

## 2016-10-13 DIAGNOSIS — Z79899 Other long term (current) drug therapy: Secondary | ICD-10-CM | POA: Insufficient documentation

## 2016-10-13 DIAGNOSIS — Z888 Allergy status to other drugs, medicaments and biological substances status: Secondary | ICD-10-CM | POA: Insufficient documentation

## 2016-10-13 DIAGNOSIS — J449 Chronic obstructive pulmonary disease, unspecified: Secondary | ICD-10-CM | POA: Insufficient documentation

## 2016-10-13 DIAGNOSIS — Z981 Arthrodesis status: Secondary | ICD-10-CM | POA: Diagnosis not present

## 2016-10-13 DIAGNOSIS — M4726 Other spondylosis with radiculopathy, lumbar region: Secondary | ICD-10-CM | POA: Insufficient documentation

## 2016-10-13 DIAGNOSIS — M48061 Spinal stenosis, lumbar region without neurogenic claudication: Secondary | ICD-10-CM | POA: Diagnosis not present

## 2016-10-13 DIAGNOSIS — E785 Hyperlipidemia, unspecified: Secondary | ICD-10-CM | POA: Insufficient documentation

## 2016-10-13 DIAGNOSIS — M47816 Spondylosis without myelopathy or radiculopathy, lumbar region: Secondary | ICD-10-CM

## 2016-10-13 DIAGNOSIS — R29898 Other symptoms and signs involving the musculoskeletal system: Secondary | ICD-10-CM

## 2016-10-13 DIAGNOSIS — Z7984 Long term (current) use of oral hypoglycemic drugs: Secondary | ICD-10-CM | POA: Insufficient documentation

## 2016-10-13 MED ORDER — MAGNESIUM OXIDE -MG SUPPLEMENT 500 MG PO CAPS: 1.0000 | ORAL_CAPSULE | Freq: Two times a day (BID) | ORAL | 0 refills | Status: DC

## 2016-10-13 MED ORDER — OXYCODONE HCL 5 MG PO TABS: 5.0000 mg | ORAL_TABLET | Freq: Four times a day (QID) | ORAL | 0 refills | Status: DC | PRN

## 2016-10-13 NOTE — Progress Notes (Signed)
Nursing Pain Medication Assessment:  Safety precautions to be maintained throughout the outpatient stay will include: orient to surroundings, keep bed in low position, maintain call bell within reach at all times, provide assistance with transfer out of bed and ambulation.  Medication Inspection Compliance: Pill count conducted under aseptic conditions, in front of the patient. Neither the pills nor the bottle was removed from the patient's sight at any time. Once count was completed pills were immediately returned to the patient in their original bottle.  Medication: Hydrocodone/APAP Pill/Patch Count: 2 of 60 pills remain Pill/Patch Appearance: Markings consistent with prescribed medication Bottle Appearance: Standard pharmacy container. Clearly labeled. Filled Date:06/26/ 2018 Last Medication intake:  Today

## 2016-10-13 NOTE — Patient Instructions (Addendum)
____________________________________________________________________________________________  Preparing for Procedure with Sedation Instructions: . Oral Intake: Do not eat or drink anything for at least 8 hours prior to your procedure. . Transportation: Public transportation is not allowed. Bring an adult driver. The driver must be physically present in our waiting room before any procedure can be started. Marland Kitchen Physical Assistance: Bring an adult physically capable of assisting you, in the event you need help. This adult should keep you company at home for at least 6 hours after the procedure. . Blood Pressure Medicine: Take your blood pressure medicine with a sip of water the morning of the procedure. . Blood thinners:  . Diabetics on insulin: Notify the staff so that you can be scheduled 1st case in the morning. If your diabetes requires high dose insulin, take only  of your normal insulin dose the morning of the procedure and notify the staff that you have done so. . Preventing infections: Shower with an antibacterial soap the morning of your procedure. . Build-up your immune system: Take 1000 mg of Vitamin C with every meal (3 times a day) the day prior to your procedure. Marland Kitchen Antibiotics: Inform the staff if you have a condition or reason that requires you to take antibiotics before dental procedures. . Pregnancy: If you are pregnant, call and cancel the procedure. . Sickness: If you have a cold, fever, or any active infections, call and cancel the procedure. . Arrival: You must be in the facility at least 30 minutes prior to your scheduled procedure. . Children: Do not bring children with you. . Dress appropriately: Bring dark clothing that you would not mind if they get stained. . Valuables: Do not bring any jewelry or valuables. Procedure appointments are reserved for interventional treatments only. Marland Kitchen No Prescription Refills. . No medication changes will be discussed during procedure  appointments. . No disability issues will be discussed. ____________________________________________________________________________________________  ____________________________________________________________________________________________  Pain Prevention Technique  Definition:   A technique used to minimize the effects of an activity known to cause inflammation or swelling, which in turn leads to an increase in pain.  Purpose: To prevent swelling from occurring. It is based on the fact that it is easier to prevent swelling from happening than it is to get rid of it, once it occurs.  Contraindications: 1. Anyone with allergy or hypersensitivity to the recommended medications. 2. Anyone taking anticoagulants (Blood Thinners) (e.g., Coumadin, Warfarin, Plavix, etc.). 3. Patients in Renal Failure.  Technique: Before you undertake an activity known to cause pain, or a flare-up of your chronic pain, and before you experience any pain, do the following:  1. On a full stomach, take 4 (four) over the counter Ibuprofens 200mg  tablets (Motrin), for a total of 800 mg. 2. In addition, take over the counter Magnesium 400 to 500 mg, before doing the activity.  3. Six (6) hours later, again on a full stomach, repeat the Ibuprofen. 4. That night, take a warm shower and stretch under the running warm water.  This technique may be sufficient to abort the pain and discomfort before it happens. Keep in mind that it takes a lot less medication to prevent swelling than it takes to eliminate it once it occurs.  ____________________________________________________________________________________________  Pain Management Discharge Instructions  General Discharge Instructions :  If you need to reach your doctor call: Monday-Friday 8:00 am - 4:00 pm at [phone] or toll free [phone].  After clinic hours [phone] to have operator reach doctor.  Bring all of your medication bottles to all  your appointments in the pain clinic.  To cancel or reschedule your appointment with Pain Management please remember to call 24 hours in advance to avoid a fee.  Refer to the educational materials which you have been given on: General Risks, I had my Procedure. Discharge Instructions, Post Sedation.  Post Procedure Instructions:  The drugs you were given will stay in your system until tomorrow, so for the next 24 hours you should not drive, make any legal decisions or drink any alcoholic beverages.  You may eat anything you prefer, but it is better to start with liquids then soups and crackers, and gradually work up to solid foods.  Please notify your doctor immediately if you have any unusual bleeding, trouble breathing or pain that is not related to your normal pain.  Depending on the type of procedure that was done, some parts of your body may feel week and/or numb.  This usually clears up by tonight or the next day.  Walk with the use of an assistive device or accompanied by an adult for the 24 hours.  You may use ice on the affected area for the first 24 hours.  Put ice in a Ziploc bag and cover with a towel and place against area 15 minutes on 15 minutes off.  You may switch to heat after 24 hours.GENERAL RISKS AND COMPLICATIONS  What are the risk, side effects and possible complications? Generally speaking, most procedures are safe.  However, with any procedure there are risks, side effects, and the possibility of complications.  The risks and complications are dependent upon the sites that are lesioned, or the type of nerve block to be performed.  The closer the procedure is to the spine, the more serious the risks are.  Great care is taken when placing the radio frequency needles, block needles or lesioning probes, but sometimes complications can occur. 1. Infection: Any time there is an injection through the skin, there is a risk of infection.  This is why sterile conditions are used  for these blocks.  There are four possible types of infection. 1. Localized skin infection. 2. Central Nervous System Infection-This can be in the form of Meningitis, which can be deadly. 3. Epidural Infections-This can be in the form of an epidural abscess, which can cause pressure inside of the spine, causing compression of the spinal cord with subsequent paralysis. This would require an emergency surgery to decompress, and there are no guarantees that the patient would recover from the paralysis. 4. Discitis-This is an infection of the intervertebral discs.  It occurs in about 1% of discography procedures.  It is difficult to treat and it may lead to surgery.        2. Pain: the needles have to go through skin and soft tissues, will cause soreness.       3. Damage to internal structures:  The nerves to be lesioned may be near blood vessels or    other nerves which can be potentially damaged.       4. Bleeding: Bleeding is more common if the patient is taking blood thinners such as  aspirin, Coumadin, Ticiid, Plavix, etc., or if he/she have some genetic predisposition  such as hemophilia. Bleeding into the spinal canal can cause compression of the spinal  cord with subsequent paralysis.  This would require an emergency surgery to  decompress and there are no guarantees that the patient would recover from the  paralysis.       5. Pneumothorax:  Puncturing of a lung is a possibility, every time a needle is introduced in  the area of the chest or upper back.  Pneumothorax refers to free air around the  collapsed lung(s), inside of the thoracic cavity (chest cavity).  Another two possible  complications related to a similar event would include: Hemothorax and Chylothorax.   These are variations of the Pneumothorax, where instead of air around the collapsed  lung(s), you may have blood or chyle, respectively.       6. Spinal headaches: They may occur with any procedures in the area of the spine.        7. Persistent CSF (Cerebro-Spinal Fluid) leakage: This is a rare problem, but may occur  with prolonged intrathecal or epidural catheters either due to the formation of a fistulous  track or a dural tear.       8. Nerve damage: By working so close to the spinal cord, there is always a possibility of  nerve damage, which could be as serious as a permanent spinal cord injury with  paralysis.       9. Death:  Although rare, severe deadly allergic reactions known as "Anaphylactic  reaction" can occur to any of the medications used.      10. Worsening of the symptoms:  We can always make thing worse.  What are the chances of something like this happening? Chances of any of this occuring are extremely low.  By statistics, you have more of a chance of getting killed in a motor vehicle accident: while driving to the hospital than any of the above occurring .  Nevertheless, you should be aware that they are possibilities.  In general, it is similar to taking a shower.  Everybody knows that you can slip, hit your head and get killed.  Does that mean that you should not shower again?  Nevertheless always keep in mind that statistics do not mean anything if you happen to be on the wrong side of them.  Even if a procedure has a 1 (one) in a 1,000,000 (million) chance of going wrong, it you happen to be that one..Also, keep in mind that by statistics, you have more of a chance of having something go wrong when taking medications.  Who should not have this procedure? If you are on a blood thinning medication (e.g. Coumadin, Plavix, see list of "Blood Thinners"), or if you have an active infection going on, you should not have the procedure.  If you are taking any blood thinners, please inform your physician.  How should I prepare for this procedure?  Do not eat or drink anything at least six hours prior to the procedure.  Bring a driver with you .  It cannot be a taxi.  Come accompanied by an adult that can drive  you back, and that is strong enough to help you if your legs get weak or numb from the local anesthetic.  Take all of your medicines the morning of the procedure with just enough water to swallow them.  If you have diabetes, make sure that you are scheduled to have your procedure done first thing in the morning, whenever possible.  If you have diabetes, take only half of your insulin dose and notify our nurse that you have done so as soon as you arrive at the clinic.  If you are diabetic, but only take blood sugar pills (oral hypoglycemic), then do not take them on the morning of your procedure.  You  may take them after you have had the procedure.  Do not take aspirin or any aspirin-containing medications, at least eleven (11) days prior to the procedure.  They may prolong bleeding.  Wear loose fitting clothing that may be easy to take off and that you would not mind if it got stained with Betadine or blood.  Do not wear any jewelry or perfume  Remove any nail coloring.  It will interfere with some of our monitoring equipment.  NOTE: Remember that this is not meant to be interpreted as a complete list of all possible complications.  Unforeseen problems may occur.  BLOOD THINNERS The following drugs contain aspirin or other products, which can cause increased bleeding during surgery and should not be taken for 2 weeks prior to and 1 week after surgery.  If you should need take something for relief of minor pain, you may take acetaminophen which is found in Tylenol,m Datril, Anacin-3 and Panadol. It is not blood thinner. The products listed below are.  Do not take any of the products listed below in addition to any listed on your instruction sheet.  A.P.C or A.P.C with Codeine Codeine Phosphate Capsules #3 Ibuprofen Ridaura  ABC compound Congesprin Imuran rimadil  Advil Cope Indocin Robaxisal  Alka-Seltzer Effervescent Pain Reliever and Antacid Coricidin or Coricidin-D  Indomethacin Rufen   Alka-Seltzer plus Cold Medicine Cosprin Ketoprofen S-A-C Tablets  Anacin Analgesic Tablets or Capsules Coumadin Korlgesic Salflex  Anacin Extra Strength Analgesic tablets or capsules CP-2 Tablets Lanoril Salicylate  Anaprox Cuprimine Capsules Levenox Salocol  Anexsia-D Dalteparin Magan Salsalate  Anodynos Darvon compound Magnesium Salicylate Sine-off  Ansaid Dasin Capsules Magsal Sodium Salicylate  Anturane Depen Capsules Marnal Soma  APF Arthritis pain formula Dewitt's Pills Measurin Stanback  Argesic Dia-Gesic Meclofenamic Sulfinpyrazone  Arthritis Bayer Timed Release Aspirin Diclofenac Meclomen Sulindac  Arthritis pain formula Anacin Dicumarol Medipren Supac  Analgesic (Safety coated) Arthralgen Diffunasal Mefanamic Suprofen  Arthritis Strength Bufferin Dihydrocodeine Mepro Compound Suprol  Arthropan liquid Dopirydamole Methcarbomol with Aspirin Synalgos  ASA tablets/Enseals Disalcid Micrainin Tagament  Ascriptin Doan's Midol Talwin  Ascriptin A/D Dolene Mobidin Tanderil  Ascriptin Extra Strength Dolobid Moblgesic Ticlid  Ascriptin with Codeine Doloprin or Doloprin with Codeine Momentum Tolectin  Asperbuf Duoprin Mono-gesic Trendar  Aspergum Duradyne Motrin or Motrin IB Triminicin  Aspirin plain, buffered or enteric coated Durasal Myochrisine Trigesic  Aspirin Suppositories Easprin Nalfon Trillsate  Aspirin with Codeine Ecotrin Regular or Extra Strength Naprosyn Uracel  Atromid-S Efficin Naproxen Ursinus  Auranofin Capsules Elmiron Neocylate Vanquish  Axotal Emagrin Norgesic Verin  Azathioprine Empirin or Empirin with Codeine Normiflo Vitamin E  Azolid Emprazil Nuprin Voltaren  Bayer Aspirin plain, buffered or children's or timed BC Tablets or powders Encaprin Orgaran Warfarin Sodium  Buff-a-Comp Enoxaparin Orudis Zorpin  Buff-a-Comp with Codeine Equegesic Os-Cal-Gesic   Buffaprin Excedrin plain, buffered or Extra Strength Oxalid   Bufferin Arthritis Strength Feldene  Oxphenbutazone   Bufferin plain or Extra Strength Feldene Capsules Oxycodone with Aspirin   Bufferin with Codeine Fenoprofen Fenoprofen Pabalate or Pabalate-SF   Buffets II Flogesic Panagesic   Buffinol plain or Extra Strength Florinal or Florinal with Codeine Panwarfarin   Buf-Tabs Flurbiprofen Penicillamine   Butalbital Compound Four-way cold tablets Penicillin   Butazolidin Fragmin Pepto-Bismol   Carbenicillin Geminisyn Percodan   Carna Arthritis Reliever Geopen Persantine   Carprofen Gold's salt Persistin   Chloramphenicol Goody's Phenylbutazone   Chloromycetin Haltrain Piroxlcam   Clmetidine heparin Plaquenil   Cllnoril Hyco-pap Ponstel   Clofibrate Hydroxy chloroquine Propoxyphen  Before stopping any of these medications, be sure to consult the physician who ordered them.  Some, such as Coumadin (Warfarin) are ordered to prevent or treat serious conditions such as "deep thrombosis", "pumonary embolisms", and other heart problems.  The amount of time that you may need off of the medication may also vary with the medication and the reason for which you were taking it.  If you are taking any of these medications, please make sure you notify your pain physician before you undergo any procedures.          Epidural Steroid Injection An epidural steroid injection is a shot of steroid medicine and numbing medicine that is given into the space between the spinal cord and the bones in your back (epidural space). The shot helps relieve pain caused by an irritated or swollen nerve root. The amount of pain relief you get from the injection depends on what is causing the nerve to be swollen and irritated, and how long your pain lasts. You are more likely to benefit from this injection if your pain is strong and comes on suddenly rather than if you have had pain for a long time. Tell a health care provider about:  Any allergies you have.  All medicines you are taking, including  vitamins, herbs, eye drops, creams, and over-the-counter medicines.  Any problems you or family members have had with anesthetic medicines.  Any blood disorders you have.  Any surgeries you have had.  Any medical conditions you have.  Whether you are pregnant or may be pregnant. What are the risks? Generally, this is a safe procedure. However, problems may occur, including:  Headache.  Bleeding.  Infection.  Allergic reaction to medicines.  Damage to your nerves.  What happens before the procedure? Staying hydrated Follow instructions from your health care provider about hydration, which may include:  Up to 2 hours before the procedure - you may continue to drink clear liquids, such as water, clear fruit juice, black coffee, and plain tea.  Eating and drinking restrictions Follow instructions from your health care provider about eating and drinking, which may include:  8 hours before the procedure - stop eating heavy meals or foods such as meat, fried foods, or fatty foods.  6 hours before the procedure - stop eating light meals or foods, such as toast or cereal.  6 hours before the procedure - stop drinking milk or drinks that contain milk.  2 hours before the procedure - stop drinking clear liquids.  Medicine  You may be given medicines to lower anxiety.  Ask your health care provider about: ? Changing or stopping your regular medicines. This is especially important if you are taking diabetes medicines or blood thinners. ? Taking medicines such as aspirin and ibuprofen. These medicines can thin your blood. Do not take these medicines before your procedure if your health care provider instructs you not to. General instructions  Plan to have someone take you home from the hospital or clinic. What happens during the procedure?  You may receive a medicine to help you relax (sedative).  You will be asked to lie on your abdomen.  The injection site will be  cleaned.  A numbing medicine (local anesthetic) will be used to numb the injection site.  A needle will be inserted through your skin into the epidural space. You may feel some discomfort when this happens. An X-ray machine will be used to make sure the needle is put as close as possible  to the affected nerve.  A steroid medicine and a local anesthetic will be injected into the epidural space.  The needle will be removed.  A bandage (dressing) will be put over the injection site. What happens after the procedure?  Your blood pressure, heart rate, breathing rate, and blood oxygen level will be monitored until the medicines you were given have worn off.  Your arm or leg may feel weak or numb for a few hours.  The injection site may feel sore.  Do not drive for 24 hours if you received a sedative. This information is not intended to replace advice given to you by your health care provider. Make sure you discuss any questions you have with your health care provider. Document Released: 06/16/2007 Document Revised: 08/21/2015 Document Reviewed: 06/25/2015 [employer]vier Interactive Patient Education  2017 [employer].

## 2016-10-22 ENCOUNTER — Ambulatory Visit
Admission: RE | Admit: 2016-10-22 | Discharge: 2016-10-22 | Disposition: A | Discharge status: Home / Self Care | Payer: Medicare Other | Source: Ambulatory Visit | Attending: Pain Medicine | Admitting: Pain Medicine

## 2016-10-22 ENCOUNTER — Ambulatory Visit (HOSPITAL_BASED_OUTPATIENT_CLINIC_OR_DEPARTMENT_OTHER): Payer: Medicare Other | Admitting: Pain Medicine

## 2016-10-22 ENCOUNTER — Encounter: Payer: Self-pay | Admitting: Pain Medicine

## 2016-10-22 VITALS — BP 109/55 | HR 57 | Temp 97.5°F | Resp 13 | Ht 70.0 in | Wt 250.0 lb

## 2016-10-22 DIAGNOSIS — M545 Low back pain, unspecified: Secondary | ICD-10-CM

## 2016-10-22 DIAGNOSIS — M5136 Other intervertebral disc degeneration, lumbar region: Secondary | ICD-10-CM | POA: Insufficient documentation

## 2016-10-22 DIAGNOSIS — M47816 Spondylosis without myelopathy or radiculopathy, lumbar region: Secondary | ICD-10-CM | POA: Diagnosis not present

## 2016-10-22 DIAGNOSIS — M5416 Radiculopathy, lumbar region: Secondary | ICD-10-CM

## 2016-10-22 DIAGNOSIS — G8929 Other chronic pain: Secondary | ICD-10-CM | POA: Insufficient documentation

## 2016-10-22 DIAGNOSIS — M48061 Spinal stenosis, lumbar region without neurogenic claudication: Secondary | ICD-10-CM | POA: Insufficient documentation

## 2016-10-22 DIAGNOSIS — M9983 Other biomechanical lesions of lumbar region: Secondary | ICD-10-CM | POA: Diagnosis present

## 2016-10-22 MED ORDER — ROPIVACAINE HCL 2 MG/ML IJ SOLN
1.0000 mL | Freq: Once | INTRAMUSCULAR | Status: AC
  Administered 2016-10-22: 1 mL via EPIDURAL

## 2016-10-22 MED ORDER — LIDOCAINE HCL (PF) 1.5 % IJ SOLN
20.0000 mL | Freq: Once | INTRAMUSCULAR | Status: DC
  Filled 2016-10-22: qty 20

## 2016-10-22 MED ORDER — DEXAMETHASONE SODIUM PHOSPHATE 10 MG/ML IJ SOLN
10.0000 mg | Freq: Once | INTRAMUSCULAR | Status: AC
  Administered 2016-10-22: 10 mg

## 2016-10-22 MED ORDER — FENTANYL CITRATE (PF) 100 MCG/2ML IJ SOLN
INTRAMUSCULAR | Status: AC
  Filled 2016-10-22: qty 2

## 2016-10-22 MED ORDER — FENTANYL CITRATE (PF) 100 MCG/2ML IJ SOLN
25.0000 ug | INTRAMUSCULAR | Status: DC | PRN
  Administered 2016-10-22: 50 ug via INTRAVENOUS

## 2016-10-22 MED ORDER — ROPIVACAINE HCL 2 MG/ML IJ SOLN
INTRAMUSCULAR | Status: AC
  Filled 2016-10-22: qty 10

## 2016-10-22 MED ORDER — LACTATED RINGERS IV SOLN
1000.0000 mL | Freq: Once | INTRAVENOUS | Status: AC
  Administered 2016-10-22: 1000 mL via INTRAVENOUS

## 2016-10-22 MED ORDER — IOPAMIDOL (ISOVUE-M 200) INJECTION 41%
10.0000 mL | Freq: Once | INTRAMUSCULAR | Status: DC
  Filled 2016-10-22: qty 10

## 2016-10-22 MED ORDER — MIDAZOLAM HCL 5 MG/5ML IJ SOLN
INTRAMUSCULAR | Status: AC
  Filled 2016-10-22: qty 5

## 2016-10-22 MED ORDER — MIDAZOLAM HCL 5 MG/5ML IJ SOLN
1.0000 mg | INTRAMUSCULAR | Status: DC | PRN
  Administered 2016-10-22: 2 mg via INTRAVENOUS

## 2016-10-22 MED ORDER — SODIUM CHLORIDE 0.9 % IJ SOLN
INTRAMUSCULAR | Status: AC
  Filled 2016-10-22: qty 20

## 2016-10-22 MED ORDER — SODIUM CHLORIDE 0.9% FLUSH
1.0000 mL | Freq: Once | INTRAVENOUS | Status: AC
  Administered 2016-10-22: 1 mL

## 2016-10-22 MED ORDER — DEXAMETHASONE SODIUM PHOSPHATE 10 MG/ML IJ SOLN
INTRAMUSCULAR | Status: AC
  Filled 2016-10-22: qty 2

## 2016-10-22 NOTE — Patient Instructions (Addendum)
____________________________________________________________________________________________  Post-Procedure instructions Instructions:  Apply ice: Fill a plastic sandwich bag with crushed ice. Cover it with a small towel and apply to injection site. Apply for 15 minutes then remove x 15 minutes. Repeat sequence on day of procedure, until you go to bed. The purpose is to minimize swelling and discomfort after procedure.  Apply heat: Apply heat to procedure site starting the day following the procedure. The purpose is to treat any soreness and discomfort from the procedure.  Food intake: Start with clear liquids (like water) and advance to regular food, as tolerated.   Physical activities: Keep activities to a minimum for the first 8 hours after the procedure.   Driving: If you have received any sedation, you are not allowed to drive for 24 hours after your procedure.  Blood thinner: Restart your blood thinner 6 hours after your procedure. (Only for those taking blood thinners)  Insulin: As soon as you can eat, you may resume your normal dosing schedule. (Only for those taking insulin)  Infection prevention: Keep procedure site clean and dry.  Post-procedure Pain Diary: Extremely important that this be done correctly and accurately. Recorded information will be used to determine the next step in treatment.  Pain evaluated is that of treated area only. Do not include pain from an untreated area.  Complete every hour, on the hour, for the initial 8 hours. Set an alarm to help you do this part accurately.  Do not go to sleep and have it completed later. It will not be accurate.  Follow-up appointment: Keep your follow-up appointment after the procedure. Usually 2 weeks for most procedures. (6 weeks in the case of radiofrequency.) Bring you pain diary.  Expect:  From numbing medicine (AKA: Local Anesthetics): Numbness or decrease in pain.  Onset: Full effect within 15 minutes of  injected.  Duration: It will depend on the type of local anesthetic used. On the average, 1 to 8 hours.   From steroids: Decrease in swelling or inflammation. Once inflammation is improved, relief of the pain will follow.  Onset of benefits: Depends on the amount of swelling present. The more swelling, the longer it will take for the benefits to be seen. In some cases, up to 10 days.  Duration: Steroids will stay in the system x 2 weeks. Duration of benefits will depend on multiple posibilities including persistent irritating factors.  From procedure: Some discomfort is to be expected once the numbing medicine wears off. This should be minimal if ice and heat are applied as instructed. Call if:  You experience numbness and weakness that gets worse with time, as opposed to wearing off.  New onset bowel or bladder incontinence. (Spinal procedures only)  Emergency Numbers:  Durning business hours (Monday - Thursday, 8:00 AM - 4:00 PM) (Friday, 9:00 AM - 12:00 Noon): [phone]  After hours: [phone] ____________________________________________________________________________________________  Pain Management Discharge Instructions  General Discharge Instructions :  If you need to reach your doctor call: Monday-Friday 8:00 am - 4:00 pm at [phone] or toll free [phone].  After clinic hours [phone] to have operator reach doctor.  Bring all of your medication bottles to all your appointments in the pain clinic.  To cancel or reschedule your appointment with Pain Management please remember to call 24 hours in advance to avoid a fee.  Refer to the educational materials which you have been given on: General Risks, I had my Procedure. Discharge Instructions, Post Sedation.  Post Procedure Instructions:  The drugs you   were given will stay in your system until tomorrow, so for the next 24 hours you should not drive, make any legal decisions or drink any alcoholic  beverages.  You may eat anything you prefer, but it is better to start with liquids then soups and crackers, and gradually work up to solid foods.  Please notify your doctor immediately if you have any unusual bleeding, trouble breathing or pain that is not related to your normal pain.  Depending on the type of procedure that was done, some parts of your body may feel week and/or numb.  This usually clears up by tonight or the next day.  Walk with the use of an assistive device or accompanied by an adult for the 24 hours.  You may use ice on the affected area for the first 24 hours.  Put ice in a Ziploc bag and cover with a towel and place against area 15 minutes on 15 minutes off.  You may switch to heat after 24 hours. Epidural Steroid Injection An epidural steroid injection is a shot of steroid medicine and numbing medicine that is given into the space between the spinal cord and the bones in your back (epidural space). The shot helps relieve pain caused by an irritated or swollen nerve root. The amount of pain relief you get from the injection depends on what is causing the nerve to be swollen and irritated, and how long your pain lasts. You are more likely to benefit from this injection if your pain is strong and comes on suddenly rather than if you have had pain for a long time. Tell a health care provider about:  Any allergies you have.  All medicines you are taking, including vitamins, herbs, eye drops, creams, and over-the-counter medicines.  Any problems you or family members have had with anesthetic medicines.  Any blood disorders you have.  Any surgeries you have had.  Any medical conditions you have.  Whether you are pregnant or may be pregnant. What are the risks? Generally, this is a safe procedure. However, problems may occur, including:  Headache.  Bleeding.  Infection.  Allergic reaction to medicines.  Damage to your nerves.  What happens before the  procedure? Staying hydrated Follow instructions from your health care provider about hydration, which may include:  Up to 2 hours before the procedure - you may continue to drink clear liquids, such as water, clear fruit juice, black coffee, and plain tea.  Eating and drinking restrictions Follow instructions from your health care provider about eating and drinking, which may include:  8 hours before the procedure - stop eating heavy meals or foods such as meat, fried foods, or fatty foods.  6 hours before the procedure - stop eating light meals or foods, such as toast or cereal.  6 hours before the procedure - stop drinking milk or drinks that contain milk.  2 hours before the procedure - stop drinking clear liquids.  Medicine  You may be given medicines to lower anxiety.  Ask your health care provider about: ? Changing or stopping your regular medicines. This is especially important if you are taking diabetes medicines or blood thinners. ? Taking medicines such as aspirin and ibuprofen. These medicines can thin your blood. Do not take these medicines before your procedure if your health care provider instructs you not to. General instructions  Plan to have someone take you home from the hospital or clinic. What happens during the procedure?  You may receive a medicine  to help you relax (sedative).  You will be asked to lie on your abdomen.  The injection site will be cleaned.  A numbing medicine (local anesthetic) will be used to numb the injection site.  A needle will be inserted through your skin into the epidural space. You may feel some discomfort when this happens. An X-ray machine will be used to make sure the needle is put as close as possible to the affected nerve.  A steroid medicine and a local anesthetic will be injected into the epidural space.  The needle will be removed.  A bandage (dressing) will be put over the injection site. What happens after the  procedure?  Your blood pressure, heart rate, breathing rate, and blood oxygen level will be monitored until the medicines you were given have worn off.  Your arm or leg may feel weak or numb for a few hours.  The injection site may feel sore.  Do not drive for 24 hours if you received a sedative. This information is not intended to replace advice given to you by your health care provider. Make sure you discuss any questions you have with your health care provider. Document Released: 06/16/2007 Document Revised: 08/21/2015 Document Reviewed: 06/25/2015 Elsevier Interactive Patient Education  2017 [place]  What are the risk, side effects and possible complications? Generally speaking, most procedures are safe.  However, with any procedure there are risks, side effects, and the possibility of complications.  The risks and complications are dependent upon the sites that are lesioned, or the type of nerve block to be performed.  The closer the procedure is to the spine, the more serious the risks are.  Great care is taken when placing the radio frequency needles, block needles or lesioning probes, but sometimes complications can occur. 1. Infection: Any time there is an injection through the skin, there is a risk of infection.  This is why sterile conditions are used for these blocks.  There are four possible types of infection. 1. Localized skin infection. 2. Central Nervous System Infection-This can be in the form of Meningitis, which can be deadly. 3. Epidural Infections-This can be in the form of an epidural abscess, which can cause pressure inside of the spine, causing compression of the spinal cord with subsequent paralysis. This would require an emergency surgery to decompress, and there are no guarantees that the patient would recover from the paralysis. 4. Discitis-This is an infection of the intervertebral discs.  It occurs in about 1% of discography  procedures.  It is difficult to treat and it may lead to surgery.        2. Pain: the needles have to go through skin and soft tissues, will cause soreness.       3. Damage to internal structures:  The nerves to be lesioned may be near blood vessels or    other nerves which can be potentially damaged.       4. Bleeding: Bleeding is more common if the patient is taking blood thinners such as  aspirin, Coumadin, Ticiid, Plavix, etc., or if he/she have some genetic predisposition  such as hemophilia. Bleeding into the spinal canal can cause compression of the spinal  cord with subsequent paralysis.  This would require an emergency surgery to  decompress and there are no guarantees that the patient would recover from the  paralysis.       5. Pneumothorax:  Puncturing of a lung is a possibility, every time  a needle is introduced in  the area of the chest or upper back.  Pneumothorax refers to free air around the  collapsed lung(s), inside of the thoracic cavity (chest cavity).  Another two possible  complications related to a similar event would include: Hemothorax and Chylothorax.   These are variations of the Pneumothorax, where instead of air around the collapsed  lung(s), you may have blood or chyle, respectively.       6. Spinal headaches: They may occur with any procedures in the area of the spine.       7. Persistent CSF (Cerebro-Spinal Fluid) leakage: This is a rare problem, but may occur  with prolonged intrathecal or epidural catheters either due to the formation of a fistulous  track or a dural tear.       8. Nerve damage: By working so close to the spinal cord, there is always a possibility of  nerve damage, which could be as serious as a permanent spinal cord injury with  paralysis.       9. Death:  Although rare, severe deadly allergic reactions known as "Anaphylactic  reaction" can occur to any of the medications used.      10. Worsening of the symptoms:  We can always make thing worse.  What  are the chances of something like this happening? Chances of any of this occuring are extremely low.  By statistics, you have more of a chance of getting killed in a motor vehicle accident: while driving to the hospital than any of the above occurring .  Nevertheless, you should be aware that they are possibilities.  In general, it is similar to taking a shower.  Everybody knows that you can slip, hit your head and get killed.  Does that mean that you should not shower again?  Nevertheless always keep in mind that statistics do not mean anything if you happen to be on the wrong side of them.  Even if a procedure has a 1 (one) in a 1,000,000 (million) chance of going wrong, it you happen to be that one..Also, keep in mind that by statistics, you have more of a chance of having something go wrong when taking medications.  Who should not have this procedure? If you are on a blood thinning medication (e.g. Coumadin, Plavix, see list of "Blood Thinners"), or if you have an active infection going on, you should not have the procedure.  If you are taking any blood thinners, please inform your physician.  How should I prepare for this procedure?  Do not eat or drink anything at least six hours prior to the procedure.  Bring a driver with you .  It cannot be a taxi.  Come accompanied by an adult that can drive you back, and that is strong enough to help you if your legs get weak or numb from the local anesthetic.  Take all of your medicines the morning of the procedure with just enough water to swallow them.  If you have diabetes, make sure that you are scheduled to have your procedure done first thing in the morning, whenever possible.  If you have diabetes, take only half of your insulin dose and notify our nurse that you have done so as soon as you arrive at the clinic.  If you are diabetic, but only take blood sugar pills (oral hypoglycemic), then do not take them on the morning of your procedure.   You may take them after you have had the procedure.  Do not take aspirin or any aspirin-containing medications, at least eleven (11) days prior to the procedure.  They may prolong bleeding.  Wear loose fitting clothing that may be easy to take off and that you would not mind if it got stained with Betadine or blood.  Do not wear any jewelry or perfume  Remove any nail coloring.  It will interfere with some of our monitoring equipment.  NOTE: Remember that this is not meant to be interpreted as a complete list of all possible complications.  Unforeseen problems may occur.  BLOOD THINNERS The following drugs contain aspirin or other products, which can cause increased bleeding during surgery and should not be taken for 2 weeks prior to and 1 week after surgery.  If you should need take something for relief of minor pain, you may take acetaminophen which is found in Tylenol,m Datril, Anacin-3 and Panadol. It is not blood thinner. The products listed below are.  Do not take any of the products listed below in addition to any listed on your instruction sheet.  A.P.C or A.P.C with Codeine Codeine Phosphate Capsules #3 Ibuprofen Ridaura  ABC compound Congesprin Imuran rimadil  Advil Cope Indocin Robaxisal  Alka-Seltzer Effervescent Pain Reliever and Antacid Coricidin or Coricidin-D  Indomethacin Rufen  Alka-Seltzer plus Cold Medicine Cosprin Ketoprofen S-A-C Tablets  Anacin Analgesic Tablets or Capsules Coumadin Korlgesic Salflex  Anacin Extra Strength Analgesic tablets or capsules CP-2 Tablets Lanoril Salicylate  Anaprox Cuprimine Capsules Levenox Salocol  Anexsia-D Dalteparin Magan Salsalate  Anodynos Darvon compound Magnesium Salicylate Sine-off  Ansaid Dasin Capsules Magsal Sodium Salicylate  Anturane Depen Capsules Marnal Soma  APF Arthritis pain formula Dewitt's Pills Measurin Stanback  Argesic Dia-Gesic Meclofenamic Sulfinpyrazone  Arthritis Bayer Timed Release Aspirin Diclofenac  Meclomen Sulindac  Arthritis pain formula Anacin Dicumarol Medipren Supac  Analgesic (Safety coated) Arthralgen Diffunasal Mefanamic Suprofen  Arthritis Strength Bufferin Dihydrocodeine Mepro Compound Suprol  Arthropan liquid Dopirydamole Methcarbomol with Aspirin Synalgos  ASA tablets/Enseals Disalcid Micrainin Tagament  Ascriptin Doan's Midol Talwin  Ascriptin A/D Dolene Mobidin Tanderil  Ascriptin Extra Strength Dolobid Moblgesic Ticlid  Ascriptin with Codeine Doloprin or Doloprin with Codeine Momentum Tolectin  Asperbuf Duoprin Mono-gesic Trendar  Aspergum Duradyne Motrin or Motrin IB Triminicin  Aspirin plain, buffered or enteric coated Durasal Myochrisine Trigesic  Aspirin Suppositories Easprin Nalfon Trillsate  Aspirin with Codeine Ecotrin Regular or Extra Strength Naprosyn Uracel  Atromid-S Efficin Naproxen Ursinus  Auranofin Capsules Elmiron Neocylate Vanquish  Axotal Emagrin Norgesic Verin  Azathioprine Empirin or Empirin with Codeine Normiflo Vitamin E  Azolid Emprazil Nuprin Voltaren  Bayer Aspirin plain, buffered or children's or timed BC Tablets or powders Encaprin Orgaran Warfarin Sodium  Buff-a-Comp Enoxaparin Orudis Zorpin  Buff-a-Comp with Codeine Equegesic Os-Cal-Gesic   Buffaprin Excedrin plain, buffered or Extra Strength Oxalid   Bufferin Arthritis Strength Feldene Oxphenbutazone   Bufferin plain or Extra Strength Feldene Capsules Oxycodone with Aspirin   Bufferin with Codeine Fenoprofen Fenoprofen Pabalate or Pabalate-SF   Buffets II Flogesic Panagesic   Buffinol plain or Extra Strength Florinal or Florinal with Codeine Panwarfarin   Buf-Tabs Flurbiprofen Penicillamine   Butalbital Compound Four-way cold tablets Penicillin   Butazolidin Fragmin Pepto-Bismol   Carbenicillin Geminisyn Percodan   Carna Arthritis Reliever Geopen Persantine   Carprofen Gold's salt Persistin   Chloramphenicol Goody's Phenylbutazone   Chloromycetin Haltrain Piroxlcam   Clmetidine  heparin Plaquenil   Cllnoril Hyco-pap Ponstel   Clofibrate Hydroxy chloroquine Propoxyphen         Before stopping  any of these medications, be sure to consult the physician who ordered them.  Some, such as Coumadin (Warfarin) are ordered to prevent or treat serious conditions such as "deep thrombosis", "pumonary embolisms", and other heart problems.  The amount of time that you may need off of the medication may also vary with the medication and the reason for which you were taking it.  If you are taking any of these medications, please make sure you notify your pain physician before you undergo any procedures.

## 2016-10-22 NOTE — Progress Notes (Signed)
Patient's Name: Howard Maxwell  MRN: [id number]  Referring Provider: Tracie Harrier, MD  DOB: 1945/04/07  PCP: Tracie Harrier, MD  DOS: 10/22/2016  Note by: Gaspar Cola, MD  Service setting: Ambulatory outpatient  Specialty: Interventional Pain Management  Patient type: Established  Location: [facility] (AMB) Pain Management Facility  Visit type: Interventional Procedure   Primary Reason for Visit: Interventional Pain Management Treatment. CC: Back Pain (lower)  Procedure:  Anesthesia, Analgesia, Anxiolysis:  Type: Therapeutic Trans-Foraminal Epidural Steroid Injection Region: Posterolateral Lumbosacral Level: L2-3 Level Laterality: Bilateral Paravertebral  Type: Local Anesthesia with Moderate (Conscious) Sedation Local Anesthetic: Lidocaine 1% Route: Intravenous (IV) IV Access: Secured Sedation: Meaningful verbal contact was maintained at all times during the procedure  Indication(s): Analgesia and Anxiety  Indications: 1. Lumbar radiculopathy (Bilateral)   2. 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 Osteoarthritis of lumbar spine   5. Lumbar DDD (degenerative disc disease)   6. Chronic low back pain (Location of Primary Source of Pain) (Bilateral) (R>L)    Pain Score: Pre-procedure: 2 /10 Post-procedure: 0-No pain/10  Pre-op Assessment:  Previous date of service: 10/13/16 Service provided: Med Refill Howard Maxwell is a 71 y.o. (year old), male patient, seen today for interventional treatment. He  has a past surgical history that includes Foot surgery (Bilateral); Anterior cervical decomp/discectomy fusion; Gallbladder surgery; Femur fracture surgery; Eye surgery; Nasal reconstruction; Cardiac catheterization (12/07/2013); and Back surgery. Howard Maxwell has a current medication list which includes the following prescription(s): atorvastatin, azilsartan-chlorthalidone, carvedilol, vitamin d3, famotidine, glipizide,  glucose blood, hydrocodone-acetaminophen, magnesium, magnesium oxide, metformin, methocarbamol, naphazoline, oxycodone, and spironolactone, and the following Facility-Administered Medications: fentanyl, iopamidol, lidocaine, and midazolam. His primarily concern today is the Back Pain (lower)  Initial Vital Signs: Blood pressure 119/68, pulse (!) 58, temperature 98.4 F (36.9 C), resp. rate 16, height 5\' 10"  (1.778 m), weight 250 lb (113.4 kg), SpO2 98 %. BMI: Estimated body mass index is 35.87 kg/m as calculated from the following:   Height as of this encounter: 5\' 10"  (1.778 m).   Weight as of this encounter: 250 lb (113.4 kg).  Risk Assessment: Allergies: Reviewed. He is allergic to loratadine.  Allergy Precautions: None required Coagulopathies: Reviewed. None identified.  Blood-thinner therapy: None at this time Active Infection(s): Reviewed. None identified. Howard Maxwell is afebrile  Site Confirmation: Howard Maxwell was asked to confirm the procedure and laterality before marking the site Procedure checklist: Completed Consent: Before the procedure and under the influence of no sedative(s), amnesic(s), or anxiolytics, the patient was informed of the treatment options, risks and possible complications. To fulfill our ethical and legal obligations, as recommended by the American Medical Association's Code of Ethics, I have informed the patient of my clinical impression; the nature and purpose of the treatment or procedure; the risks, benefits, and possible complications of the intervention; the alternatives, including doing nothing; the risk(s) and benefit(s) of the alternative treatment(s) or procedure(s); and the risk(s) and benefit(s) of doing nothing. The patient was provided information about the general risks and possible complications associated with the procedure. These may include, but are not limited to: failure to achieve desired goals, infection, bleeding, organ or nerve damage,  allergic reactions, paralysis, and death. In addition, the patient was informed of those risks and complications associated to Spine-related procedures, such as failure to decrease pain; infection (i.e.: Meningitis, epidural or intraspinal abscess); bleeding (i.e.: epidural hematoma, subarachnoid hemorrhage, or any other type of  intraspinal or peri-dural bleeding); organ or nerve damage (i.e.: Any type of peripheral nerve, nerve root, or spinal cord injury) with subsequent damage to sensory, motor, and/or autonomic systems, resulting in permanent pain, numbness, and/or weakness of one or several areas of the body; allergic reactions; (i.e.: anaphylactic reaction); and/or death. Furthermore, the patient was informed of those risks and complications associated with the medications. These include, but are not limited to: allergic reactions (i.e.: anaphylactic or anaphylactoid reaction(s)); adrenal axis suppression; blood sugar elevation that in diabetics may result in ketoacidosis or comma; water retention that in patients with history of congestive heart failure may result in shortness of breath, pulmonary edema, and decompensation with resultant heart failure; weight gain; swelling or edema; medication-induced neural toxicity; particulate matter embolism and blood vessel occlusion with resultant organ, and/or nervous system infarction; and/or aseptic necrosis of one or more joints. Finally, the patient was informed that Medicine is not an exact science; therefore, there is also the possibility of unforeseen or unpredictable risks and/or possible complications that may result in a catastrophic outcome. The patient indicated having understood very clearly. We have given the patient no guarantees and we have made no promises. Enough time was given to the patient to ask questions, all of which were answered to the patient's satisfaction. Howard Maxwell has indicated that he wanted to continue with the  procedure. Attestation: I, the ordering provider, attest that I have discussed with the patient the benefits, risks, side-effects, alternatives, likelihood of achieving goals, and potential problems during recovery for the procedure that I have provided informed consent. Date: 10/22/2016; Time: 8:26 AM  Pre-Procedure Preparation:  Monitoring: As per clinic protocol. Respiration, ETCO2, SpO2, BP, heart rate and rhythm monitor placed and checked for adequate function Safety Precautions: Patient was assessed for positional comfort and pressure points before starting the procedure. Time-out: I initiated and conducted the "Time-out" before starting the procedure, as per protocol. The patient was asked to participate by confirming the accuracy of the "Time Out" information. Verification of the correct person, site, and procedure were performed and confirmed by me, the nursing staff, and the patient. "Time-out" conducted as per Joint Commission's Universal Protocol (UP.01.01.01). "Time-out" Date & Time: 10/22/2016; 0837 hrs.  Description of Procedure Process:   Position: Prone Target Area: For Epidural Steroid injection(s), the target area is the  interlaminar space, initially targeting the lower border of the superior vertebral body lamina. Approach: Paravertebral approach. Area Prepped: Entire Posterior Lumbosacral Area Prepping solution: ChloraPrep (2% chlorhexidine gluconate and 70% isopropyl alcohol) Safety Precautions: Aspiration looking for blood return was conducted prior to all injections. At no point did we inject any substances, as a needle was being advanced. No attempts were made at seeking any paresthesias. Safe injection practices and needle disposal techniques used. Medications properly checked for expiration dates. SDV (single dose vial) medications used. Description of the Procedure: Protocol guidelines were followed. The patient was placed in position over the procedure table. The target  area was identified and the area prepped in the usual manner. Skin desensitized using vapocoolant spray. Skin & deeper tissues infiltrated with local anesthetic. Appropriate amount of time allowed to pass for local anesthetics to take effect. The procedure needles were then advanced to the target area. Proper needle placement secured. Negative aspiration confirmed. Solution injected in intermittent fashion, asking for systemic symptoms every 0.5cc of injectate. The needles were then removed and the area cleansed, making sure to leave some of the prepping solution back to take advantage of its long  term bactericidal properties. Vitals:   10/22/16 0855 10/22/16 0905 10/22/16 0915 10/22/16 0925  BP: 130/71 116/83 (!) 120/56 (!) 109/55  Pulse: (!) 59 (!) 56 (!) 58 (!) 57  Resp: 16 12 12 13   Temp:  (!) 97.5 F (36.4 C)    SpO2: 97% 98% 98% 99%  Weight:      Height:        Start Time: 0837 hrs. End Time: 0851 hrs. Materials:  Needle(s) Type: Regular needle Gauge: 22G Length: 5-in Medication(s): We administered lactated ringers, midazolam, fentaNYL, dexamethasone, ropivacaine (PF) 2 mg/mL (0.2%), sodium chloride flush, dexamethasone, ropivacaine (PF) 2 mg/mL (0.2%), and sodium chloride flush. Please see chart orders for dosing details.  Imaging Guidance (Spinal):  Type of Imaging Technique: Fluoroscopy Guidance (Spinal) Indication(s): Assistance in needle guidance and placement for procedures requiring needle placement in or near specific anatomical locations not easily accessible without such assistance. Exposure Time: Please see nurses notes. Contrast: Before injecting any contrast, we confirmed that the patient did not have an allergy to iodine, shellfish, or radiological contrast. Once satisfactory needle placement was completed at the desired level, radiological contrast was injected. Contrast injected under live fluoroscopy. No contrast complications. See chart for type and volume of contrast  used. Fluoroscopic Guidance: I was personally present during the use of fluoroscopy. "Tunnel Vision Technique" used to obtain the best possible view of the target area. Parallax error corrected before commencing the procedure. "Direction-depth-direction" technique used to introduce the needle under continuous pulsed fluoroscopy. Once target was reached, antero-posterior, oblique, and lateral fluoroscopic projection used confirm needle placement in all planes. Images permanently stored in EMR. Interpretation: I personally interpreted the imaging intraoperatively. Adequate needle placement confirmed in multiple planes. Appropriate spread of contrast into desired area was observed. No evidence of afferent or efferent intravascular uptake. No intrathecal or subarachnoid spread observed. Permanent images saved into the patient's record.  Antibiotic Prophylaxis:  Indication(s): None identified Antibiotic given: None  Post-operative Assessment:  EBL: None Complications: No immediate post-treatment complications observed by team, or reported by patient. Note: The patient tolerated the entire procedure well. A repeat set of vitals were taken after the procedure and the patient was kept under observation following institutional policy, for this type of procedure. Post-procedural neurological assessment was performed, showing return to baseline, prior to discharge. The patient was provided with post-procedure discharge instructions, including a section on how to identify potential problems. Should any problems arise concerning this procedure, the patient was given instructions to immediately contact us, at any time, without hesitation. In any case, we plan to contact the patient by telephone for a follow-up status report regarding this interventional procedure. Comments:  No additional relevant information.  Plan of Care  Disposition: Discharge home  Discharge Date & Time: 10/22/2016; 0926  hrs.  Physician-requested Follow-up:  Return for post-procedure eval by Dr. Dossie Arbour in 2 weeks.  Future Appointments Date Time Provider [place]  11/09/2016 8:45 AM Milinda Pointer, MD [facility] None  12/11/2016 10:40 AM Wellington Hampshire, MD [id number]    Procedure Orders Placed     Lumbar Transforaminal Epidural Medications ordered for procedure: Meds ordered this encounter  Medications  . lactated ringers infusion 1,000 mL  . midazolam (VERSED) 5 MG/5ML injection 1-2 mg    Make sure Flumazenil is available in the pyxis when using this medication. If oversedation occurs, administer 0.2 mg IV over 15 sec. If after 45 sec no response, administer 0.2 mg again over 1 min; may repeat at 1  min intervals; not to exceed 4 doses (1 mg)  . fentaNYL (SUBLIMAZE) injection 25-50 mcg    Make sure Narcan is available in the pyxis when using this medication. In the event of respiratory depression (RR< 8/min): Titrate NARCAN (naloxone) in increments of 0.1 to 0.2 mg IV at 2-3 minute intervals, until desired degree of reversal.  . lidocaine 1.5 % injection 20 mL    This order is for documentation purposes only. Lidocaine w/ preservative included in block trays.  . iopamidol (ISOVUE-M) 41 % intrathecal injection 10 mL  . dexamethasone (DECADRON) injection 10 mg  . ropivacaine (PF) 2 mg/mL (0.2%) (NAROPIN) injection 1 mL  . sodium chloride flush (NS) 0.9 % injection 1 mL  . dexamethasone (DECADRON) injection 10 mg  . ropivacaine (PF) 2 mg/mL (0.2%) (NAROPIN) injection 1 mL  . sodium chloride flush (NS) 0.9 % injection 1 mL   Medications administered: We administered lactated ringers, midazolam, fentaNYL, dexamethasone, ropivacaine (PF) 2 mg/mL (0.2%), sodium chloride flush, dexamethasone, ropivacaine (PF) 2 mg/mL (0.2%), and sodium chloride flush.  See the medical record for exact dosing, route, and time of administration.  Orders:     Imaging Ordered:  Imaging Orders  Placed     DG C-Arm 1-60 Min-No Report No results found for this or any previous visit. New Prescriptions   No medications on file   Primary Care Physician: Tracie Harrier, MD Location: [facility] Note by: Gaspar Cola, MD Date: 10/22/2016; Time: 9:58 AM  Disclaimer:  Medicine is not an exact science. The only guarantee in medicine is that nothing is guaranteed. It is important to note that the decision to proceed with this intervention was based on the information collected from the patient. The Data and conclusions were drawn from the patient's questionnaire, the interview, and the physical examination. Because the information was provided in large part by the patient, it cannot be guaranteed that it has not been purposely or unconsciously manipulated. Every effort has been made to obtain as much relevant data as possible for this evaluation. It is important to note that the conclusions that lead to this procedure are derived in large part from the available data. Always take into account that the treatment will also be dependent on availability of resources and existing treatment guidelines, considered by other Pain Management Practitioners as being common knowledge and practice, at the time of the intervention. For Medico-Legal purposes, it is also important to point out that variation in procedural techniques and pharmacological choices are the acceptable norm. The indications, contraindications, technique, and results of the above procedure should only be interpreted and judged by a Board-Certified Interventional Pain Specialist with extensive familiarity and expertise in the same exact procedure and technique.  Instructions provided at this appointment: Patient Instructions   ____________________________________________________________________________________________  Post-Procedure instructions Instructions:  Apply ice: Fill a plastic sandwich bag  with crushed ice. Cover it with a small towel and apply to injection site. Apply for 15 minutes then remove x 15 minutes. Repeat sequence on day of procedure, until you go to bed. The purpose is to minimize swelling and discomfort after procedure.  Apply heat: Apply heat to procedure site starting the day following the procedure. The purpose is to treat any soreness and discomfort from the procedure.  Food intake: Start with clear liquids (like water) and advance to regular food, as tolerated.   Physical activities: Keep activities to a minimum for the first 8 hours after the procedure.   Driving:  If you have received any sedation, you are not allowed to drive for 24 hours after your procedure.  Blood thinner: Restart your blood thinner 6 hours after your procedure. (Only for those taking blood thinners)  Insulin: As soon as you can eat, you may resume your normal dosing schedule. (Only for those taking insulin)  Infection prevention: Keep procedure site clean and dry.  Post-procedure Pain Diary: Extremely important that this be done correctly and accurately. Recorded information will be used to determine the next step in treatment.  Pain evaluated is that of treated area only. Do not include pain from an untreated area.  Complete every hour, on the hour, for the initial 8 hours. Set an alarm to help you do this part accurately.  Do not go to sleep and have it completed later. It will not be accurate.  Follow-up appointment: Keep your follow-up appointment after the procedure. Usually 2 weeks for most procedures. (6 weeks in the case of radiofrequency.) Bring you pain diary.  Expect:  From numbing medicine (AKA: Local Anesthetics): Numbness or decrease in pain.  Onset: Full effect within 15 minutes of injected.  Duration: It will depend on the type of local anesthetic used. On the average, 1 to 8 hours.   From steroids: Decrease in swelling or inflammation. Once inflammation is  improved, relief of the pain will follow.  Onset of benefits: Depends on the amount of swelling present. The more swelling, the longer it will take for the benefits to be seen. In some cases, up to 10 days.  Duration: Steroids will stay in the system x 2 weeks. Duration of benefits will depend on multiple posibilities including persistent irritating factors.  From procedure: Some discomfort is to be expected once the numbing medicine wears off. This should be minimal if ice and heat are applied as instructed. Call if:  You experience numbness and weakness that gets worse with time, as opposed to wearing off.  New onset bowel or bladder incontinence. (Spinal procedures only)  Emergency Numbers:  Durning business hours (Monday - Thursday, 8:00 AM - 4:00 PM) (Friday, 9:00 AM - 12:00 Noon): [phone]  After hours: [phone] ____________________________________________________________________________________________  Pain Management Discharge Instructions  General Discharge Instructions :  If you need to reach your doctor call: Monday-Friday 8:00 am - 4:00 pm at [phone] or toll free [phone].  After clinic hours [phone] to have operator reach doctor.  Bring all of your medication bottles to all your appointments in the pain clinic.  To cancel or reschedule your appointment with Pain Management please remember to call 24 hours in advance to avoid a fee.  Refer to the educational materials which you have been given on: General Risks, I had my Procedure. Discharge Instructions, Post Sedation.  Post Procedure Instructions:  The drugs you were given will stay in your system until tomorrow, so for the next 24 hours you should not drive, make any legal decisions or drink any alcoholic beverages.  You may eat anything you prefer, but it is better to start with liquids then soups and crackers, and gradually work up to solid foods.  Please notify your doctor  immediately if you have any unusual bleeding, trouble breathing or pain that is not related to your normal pain.  Depending on the type of procedure that was done, some parts of your body may feel week and/or numb.  This usually clears up by tonight or the next day.  Walk with the use of an assistive device  or accompanied by an adult for the 24 hours.  You may use ice on the affected area for the first 24 hours.  Put ice in a Ziploc bag and cover with a towel and place against area 15 minutes on 15 minutes off.  You may switch to heat after 24 hours. Epidural Steroid Injection An epidural steroid injection is a shot of steroid medicine and numbing medicine that is given into the space between the spinal cord and the bones in your back (epidural space). The shot helps relieve pain caused by an irritated or swollen nerve root. The amount of pain relief you get from the injection depends on what is causing the nerve to be swollen and irritated, and how long your pain lasts. You are more likely to benefit from this injection if your pain is strong and comes on suddenly rather than if you have had pain for a long time. Tell a health care provider about:  Any allergies you have.  All medicines you are taking, including vitamins, herbs, eye drops, creams, and over-the-counter medicines.  Any problems you or family members have had with anesthetic medicines.  Any blood disorders you have.  Any surgeries you have had.  Any medical conditions you have.  Whether you are pregnant or may be pregnant. What are the risks? Generally, this is a safe procedure. However, problems may occur, including:  Headache.  Bleeding.  Infection.  Allergic reaction to medicines.  Damage to your nerves.  What happens before the procedure? Staying hydrated Follow instructions from your health care provider about hydration, which may include:  Up to 2 hours before the procedure - you may continue to drink  clear liquids, such as water, clear fruit juice, black coffee, and plain tea.  Eating and drinking restrictions Follow instructions from your health care provider about eating and drinking, which may include:  8 hours before the procedure - stop eating heavy meals or foods such as meat, fried foods, or fatty foods.  6 hours before the procedure - stop eating light meals or foods, such as toast or cereal.  6 hours before the procedure - stop drinking milk or drinks that contain milk.  2 hours before the procedure - stop drinking clear liquids.  Medicine  You may be given medicines to lower anxiety.  Ask your health care provider about: ? Changing or stopping your regular medicines. This is especially important if you are taking diabetes medicines or blood thinners. ? Taking medicines such as aspirin and ibuprofen. These medicines can thin your blood. Do not take these medicines before your procedure if your health care provider instructs you not to. General instructions  Plan to have someone take you home from the hospital or clinic. What happens during the procedure?  You may receive a medicine to help you relax (sedative).  You will be asked to lie on your abdomen.  The injection site will be cleaned.  A numbing medicine (local anesthetic) will be used to numb the injection site.  A needle will be inserted through your skin into the epidural space. You may feel some discomfort when this happens. An X-ray machine will be used to make sure the needle is put as close as possible to the affected nerve.  A steroid medicine and a local anesthetic will be injected into the epidural space.  The needle will be removed.  A bandage (dressing) will be put over the injection site. What happens after the procedure?  Your blood pressure,  heart rate, breathing rate, and blood oxygen level will be monitored until the medicines you were given have worn off.  Your arm or leg may feel weak  or numb for a few hours.  The injection site may feel sore.  Do not drive for 24 hours if you received a sedative. This information is not intended to replace advice given to you by your health care provider. Make sure you discuss any questions you have with your health care provider. Document Released: 06/16/2007 Document Revised: 08/21/2015 Document Reviewed: 06/25/2015 Elsevier Interactive Patient Education  2017 [place]  What are the risk, side effects and possible complications? Generally speaking, most procedures are safe.  However, with any procedure there are risks, side effects, and the possibility of complications.  The risks and complications are dependent upon the sites that are lesioned, or the type of nerve block to be performed.  The closer the procedure is to the spine, the more serious the risks are.  Great care is taken when placing the radio frequency needles, block needles or lesioning probes, but sometimes complications can occur. 1. Infection: Any time there is an injection through the skin, there is a risk of infection.  This is why sterile conditions are used for these blocks.  There are four possible types of infection. 1. Localized skin infection. 2. Central Nervous System Infection-This can be in the form of Meningitis, which can be deadly. 3. Epidural Infections-This can be in the form of an epidural abscess, which can cause pressure inside of the spine, causing compression of the spinal cord with subsequent paralysis. This would require an emergency surgery to decompress, and there are no guarantees that the patient would recover from the paralysis. 4. Discitis-This is an infection of the intervertebral discs.  It occurs in about 1% of discography procedures.  It is difficult to treat and it may lead to surgery.        2. Pain: the needles have to go through skin and soft tissues, will cause soreness.       3. Damage to internal  structures:  The nerves to be lesioned may be near blood vessels or    other nerves which can be potentially damaged.       4. Bleeding: Bleeding is more common if the patient is taking blood thinners such as  aspirin, Coumadin, Ticiid, Plavix, etc., or if he/she have some genetic predisposition  such as hemophilia. Bleeding into the spinal canal can cause compression of the spinal  cord with subsequent paralysis.  This would require an emergency surgery to  decompress and there are no guarantees that the patient would recover from the  paralysis.       5. Pneumothorax:  Puncturing of a lung is a possibility, every time a needle is introduced in  the area of the chest or upper back.  Pneumothorax refers to free air around the  collapsed lung(s), inside of the thoracic cavity (chest cavity).  Another two possible  complications related to a similar event would include: Hemothorax and Chylothorax.   These are variations of the Pneumothorax, where instead of air around the collapsed  lung(s), you may have blood or chyle, respectively.       6. Spinal headaches: They may occur with any procedures in the area of the spine.       7. Persistent CSF (Cerebro-Spinal Fluid) leakage: This is a rare problem, but may occur  with prolonged intrathecal or epidural  catheters either due to the formation of a fistulous  track or a dural tear.       8. Nerve damage: By working so close to the spinal cord, there is always a possibility of  nerve damage, which could be as serious as a permanent spinal cord injury with  paralysis.       9. Death:  Although rare, severe deadly allergic reactions known as "Anaphylactic  reaction" can occur to any of the medications used.      10. Worsening of the symptoms:  We can always make thing worse.  What are the chances of something like this happening? Chances of any of this occuring are extremely low.  By statistics, you have more of a chance of getting killed in a motor vehicle  accident: while driving to the hospital than any of the above occurring .  Nevertheless, you should be aware that they are possibilities.  In general, it is similar to taking a shower.  Everybody knows that you can slip, hit your head and get killed.  Does that mean that you should not shower again?  Nevertheless always keep in mind that statistics do not mean anything if you happen to be on the wrong side of them.  Even if a procedure has a 1 (one) in a 1,000,000 (million) chance of going wrong, it you happen to be that one..Also, keep in mind that by statistics, you have more of a chance of having something go wrong when taking medications.  Who should not have this procedure? If you are on a blood thinning medication (e.g. Coumadin, Plavix, see list of "Blood Thinners"), or if you have an active infection going on, you should not have the procedure.  If you are taking any blood thinners, please inform your physician.  How should I prepare for this procedure?  Do not eat or drink anything at least six hours prior to the procedure.  Bring a driver with you .  It cannot be a taxi.  Come accompanied by an adult that can drive you back, and that is strong enough to help you if your legs get weak or numb from the local anesthetic.  Take all of your medicines the morning of the procedure with just enough water to swallow them.  If you have diabetes, make sure that you are scheduled to have your procedure done first thing in the morning, whenever possible.  If you have diabetes, take only half of your insulin dose and notify our nurse that you have done so as soon as you arrive at the clinic.  If you are diabetic, but only take blood sugar pills (oral hypoglycemic), then do not take them on the morning of your procedure.  You may take them after you have had the procedure.  Do not take aspirin or any aspirin-containing medications, at least eleven (11) days prior to the procedure.  They may prolong  bleeding.  Wear loose fitting clothing that may be easy to take off and that you would not mind if it got stained with Betadine or blood.  Do not wear any jewelry or perfume  Remove any nail coloring.  It will interfere with some of our monitoring equipment.  NOTE: Remember that this is not meant to be interpreted as a complete list of all possible complications.  Unforeseen problems may occur.  BLOOD THINNERS The following drugs contain aspirin or other products, which can cause increased bleeding during surgery and should not be taken  for 2 weeks prior to and 1 week after surgery.  If you should need take something for relief of minor pain, you may take acetaminophen which is found in Tylenol,m Datril, Anacin-3 and Panadol. It is not blood thinner. The products listed below are.  Do not take any of the products listed below in addition to any listed on your instruction sheet.  A.P.C or A.P.C with Codeine Codeine Phosphate Capsules #3 Ibuprofen Ridaura  ABC compound Congesprin Imuran rimadil  Advil Cope Indocin Robaxisal  Alka-Seltzer Effervescent Pain Reliever and Antacid Coricidin or Coricidin-D  Indomethacin Rufen  Alka-Seltzer plus Cold Medicine Cosprin Ketoprofen S-A-C Tablets  Anacin Analgesic Tablets or Capsules Coumadin Korlgesic Salflex  Anacin Extra Strength Analgesic tablets or capsules CP-2 Tablets Lanoril Salicylate  Anaprox Cuprimine Capsules Levenox Salocol  Anexsia-D Dalteparin Magan Salsalate  Anodynos Darvon compound Magnesium Salicylate Sine-off  Ansaid Dasin Capsules Magsal Sodium Salicylate  Anturane Depen Capsules Marnal Soma  APF Arthritis pain formula Dewitt's Pills Measurin Stanback  Argesic Dia-Gesic Meclofenamic Sulfinpyrazone  Arthritis Bayer Timed Release Aspirin Diclofenac Meclomen Sulindac  Arthritis pain formula Anacin Dicumarol Medipren Supac  Analgesic (Safety coated) Arthralgen Diffunasal Mefanamic Suprofen  Arthritis Strength Bufferin Dihydrocodeine  Mepro Compound Suprol  Arthropan liquid Dopirydamole Methcarbomol with Aspirin Synalgos  ASA tablets/Enseals Disalcid Micrainin Tagament  Ascriptin Doan's Midol Talwin  Ascriptin A/D Dolene Mobidin Tanderil  Ascriptin Extra Strength Dolobid Moblgesic Ticlid  Ascriptin with Codeine Doloprin or Doloprin with Codeine Momentum Tolectin  Asperbuf Duoprin Mono-gesic Trendar  Aspergum Duradyne Motrin or Motrin IB Triminicin  Aspirin plain, buffered or enteric coated Durasal Myochrisine Trigesic  Aspirin Suppositories Easprin Nalfon Trillsate  Aspirin with Codeine Ecotrin Regular or Extra Strength Naprosyn Uracel  Atromid-S Efficin Naproxen Ursinus  Auranofin Capsules Elmiron Neocylate Vanquish  Axotal Emagrin Norgesic Verin  Azathioprine Empirin or Empirin with Codeine Normiflo Vitamin E  Azolid Emprazil Nuprin Voltaren  Bayer Aspirin plain, buffered or children's or timed BC Tablets or powders Encaprin Orgaran Warfarin Sodium  Buff-a-Comp Enoxaparin Orudis Zorpin  Buff-a-Comp with Codeine Equegesic Os-Cal-Gesic   Buffaprin Excedrin plain, buffered or Extra Strength Oxalid   Bufferin Arthritis Strength Feldene Oxphenbutazone   Bufferin plain or Extra Strength Feldene Capsules Oxycodone with Aspirin   Bufferin with Codeine Fenoprofen Fenoprofen Pabalate or Pabalate-SF   Buffets II Flogesic Panagesic   Buffinol plain or Extra Strength Florinal or Florinal with Codeine Panwarfarin   Buf-Tabs Flurbiprofen Penicillamine   Butalbital Compound Four-way cold tablets Penicillin   Butazolidin Fragmin Pepto-Bismol   Carbenicillin Geminisyn Percodan   Carna Arthritis Reliever Geopen Persantine   Carprofen Gold's salt Persistin   Chloramphenicol Goody's Phenylbutazone   Chloromycetin Haltrain Piroxlcam   Clmetidine heparin Plaquenil   Cllnoril Hyco-pap Ponstel   Clofibrate Hydroxy chloroquine Propoxyphen         Before stopping any of these medications, be sure to consult the physician who ordered  them.  Some, such as Coumadin (Warfarin) are ordered to prevent or treat serious conditions such as "deep thrombosis", "pumonary embolisms", and other heart problems.  The amount of time that you may need off of the medication may also vary with the medication and the reason for which you were taking it.  If you are taking any of these medications, please make sure you notify your pain physician before you undergo any procedures.

## 2016-10-23 ENCOUNTER — Telehealth: Payer: Self-pay | Admitting: *Deleted

## 2016-10-23 NOTE — Telephone Encounter (Signed)
Voicemail left with patient to call if there are any questions or concerns re; procedure on yesterday.

## 2016-11-04 ENCOUNTER — Ambulatory Visit: Payer: Medicare Other | Admitting: Pain Medicine

## 2016-11-09 ENCOUNTER — Ambulatory Visit: Payer: Medicare Other | Admitting: Pain Medicine

## 2016-11-11 ENCOUNTER — Encounter: Payer: Self-pay | Admitting: Pain Medicine

## 2016-11-11 ENCOUNTER — Ambulatory Visit: Payer: Medicare Other | Attending: Pain Medicine | Admitting: Pain Medicine

## 2016-11-11 ENCOUNTER — Ambulatory Visit: Payer: Medicare Other | Admitting: Pain Medicine

## 2016-11-11 VITALS — BP 122/53 | HR 57 | Temp 98.2°F | Resp 16 | Ht 70.0 in | Wt 250.0 lb

## 2016-11-11 DIAGNOSIS — G8929 Other chronic pain: Secondary | ICD-10-CM

## 2016-11-11 DIAGNOSIS — M9981 Other biomechanical lesions of cervical region: Secondary | ICD-10-CM | POA: Diagnosis not present

## 2016-11-11 DIAGNOSIS — D649 Anemia, unspecified: Secondary | ICD-10-CM | POA: Diagnosis not present

## 2016-11-11 DIAGNOSIS — M79601 Pain in right arm: Secondary | ICD-10-CM | POA: Diagnosis not present

## 2016-11-11 DIAGNOSIS — J449 Chronic obstructive pulmonary disease, unspecified: Secondary | ICD-10-CM | POA: Diagnosis not present

## 2016-11-11 DIAGNOSIS — Z79891 Long term (current) use of opiate analgesic: Secondary | ICD-10-CM | POA: Diagnosis not present

## 2016-11-11 DIAGNOSIS — M5136 Other intervertebral disc degeneration, lumbar region: Secondary | ICD-10-CM | POA: Diagnosis not present

## 2016-11-11 DIAGNOSIS — M48061 Spinal stenosis, lumbar region without neurogenic claudication: Secondary | ICD-10-CM | POA: Diagnosis not present

## 2016-11-11 DIAGNOSIS — I25110 Atherosclerotic heart disease of native coronary artery with unstable angina pectoris: Secondary | ICD-10-CM | POA: Insufficient documentation

## 2016-11-11 DIAGNOSIS — M1610 Unilateral primary osteoarthritis, unspecified hip: Secondary | ICD-10-CM | POA: Insufficient documentation

## 2016-11-11 DIAGNOSIS — M4826 Kissing spine, lumbar region: Secondary | ICD-10-CM | POA: Insufficient documentation

## 2016-11-11 DIAGNOSIS — M79602 Pain in left arm: Secondary | ICD-10-CM | POA: Insufficient documentation

## 2016-11-11 DIAGNOSIS — I471 Supraventricular tachycardia: Secondary | ICD-10-CM | POA: Diagnosis not present

## 2016-11-11 DIAGNOSIS — E559 Vitamin D deficiency, unspecified: Secondary | ICD-10-CM | POA: Insufficient documentation

## 2016-11-11 DIAGNOSIS — M5416 Radiculopathy, lumbar region: Secondary | ICD-10-CM

## 2016-11-11 DIAGNOSIS — M4802 Spinal stenosis, cervical region: Secondary | ICD-10-CM | POA: Insufficient documentation

## 2016-11-11 DIAGNOSIS — M1712 Unilateral primary osteoarthritis, left knee: Secondary | ICD-10-CM | POA: Insufficient documentation

## 2016-11-11 DIAGNOSIS — M545 Low back pain, unspecified: Secondary | ICD-10-CM

## 2016-11-11 DIAGNOSIS — M961 Postlaminectomy syndrome, not elsewhere classified: Secondary | ICD-10-CM

## 2016-11-11 DIAGNOSIS — G894 Chronic pain syndrome: Secondary | ICD-10-CM | POA: Insufficient documentation

## 2016-11-11 DIAGNOSIS — I10 Essential (primary) hypertension: Secondary | ICD-10-CM | POA: Insufficient documentation

## 2016-11-11 DIAGNOSIS — Z981 Arthrodesis status: Secondary | ICD-10-CM | POA: Insufficient documentation

## 2016-11-11 DIAGNOSIS — E119 Type 2 diabetes mellitus without complications: Secondary | ICD-10-CM | POA: Diagnosis not present

## 2016-11-11 DIAGNOSIS — M5414 Radiculopathy, thoracic region: Secondary | ICD-10-CM | POA: Diagnosis not present

## 2016-11-11 DIAGNOSIS — E785 Hyperlipidemia, unspecified: Secondary | ICD-10-CM | POA: Diagnosis not present

## 2016-11-11 DIAGNOSIS — K219 Gastro-esophageal reflux disease without esophagitis: Secondary | ICD-10-CM | POA: Diagnosis not present

## 2016-11-11 DIAGNOSIS — F329 Major depressive disorder, single episode, unspecified: Secondary | ICD-10-CM | POA: Insufficient documentation

## 2016-11-11 DIAGNOSIS — Z87891 Personal history of nicotine dependence: Secondary | ICD-10-CM | POA: Insufficient documentation

## 2016-11-11 DIAGNOSIS — M5116 Intervertebral disc disorders with radiculopathy, lumbar region: Secondary | ICD-10-CM | POA: Insufficient documentation

## 2016-11-11 DIAGNOSIS — M160 Bilateral primary osteoarthritis of hip: Secondary | ICD-10-CM | POA: Insufficient documentation

## 2016-11-11 DIAGNOSIS — Z7984 Long term (current) use of oral hypoglycemic drugs: Secondary | ICD-10-CM | POA: Insufficient documentation

## 2016-11-11 MED ORDER — OXYCODONE HCL 5 MG PO TABS: 5.0000 mg | ORAL_TABLET | Freq: Four times a day (QID) | ORAL | 0 refills | Status: DC | PRN

## 2016-11-11 NOTE — Patient Instructions (Signed)

## 2016-11-11 NOTE — Progress Notes (Addendum)
Nursing Pain Medication Assessment:  Safety precautions to be maintained throughout the outpatient stay will include: orient to surroundings, keep bed in low position, maintain call bell within reach at all times, provide assistance with transfer out of bed and ambulation.  Medication Inspection Compliance: Pill count conducted under aseptic conditions, in front of the patient. Neither the pills nor the bottle was removed from the patient's sight at any time. Once count was completed pills were immediately returned to the patient in their original bottle.  Medication: Oxycodone IR Pill/Patch Count: 7 of 120 pills remain Pill/Patch Appearance: Markings consistent with prescribed medication Bottle Appearance: Standard pharmacy container. Clearly labeled. 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  Patient has appointment with GI MD for Anemia

## 2016-11-11 NOTE — Progress Notes (Signed)
Patient's Name: Howard Maxwell  MRN: [id number]  Referring Provider: Tracie Harrier, MD  DOB: 12/11/1945  PCP: Tracie Harrier, MD  DOS: 11/11/2016  Note by: Gaspar Cola, MD  Service setting: Ambulatory outpatient  Specialty: Interventional Pain Management  Location: [facility] (AMB) Pain Management Facility    Patient type: Established   Primary Reason(s) for Visit: Encounter for prescription drug management & post-procedure evaluation of chronic illness with mild to moderate exacerbation(Level of risk: moderate) CC: Back Pain (low)  HPI  Howard Maxwell is a 71 y.o. year old, male patient, who comes today for a post-procedure evaluation and medication management. 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); Musculoskeletal pain; Osteoarthritis of lumbar spine; Chronic low back pain (Secondary Area of Pain) (Bilateral) (R>L); Chronic hip pain (Location of Secondary source of pain) (Bilateral) (R>L); Chronic neck pain (Location of Tertiary source of pain) (Bilateral) (L>R); Chronic knee pain (Left); 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, C5-6, C6-7; 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) (s/p ACDF); 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); 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); Tricompartmental disease of knee (Left); 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); 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); 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 His primarily concern today is the Back Pain (low)  Pain Assessment: Location: Lower Back Radiating: radiates down both hips at times, then stops Onset: More than a month ago Duration: Chronic pain Quality: Aching, Constant, Radiating Severity: 2 /10 (self-reported pain score)  Note: Reported level is compatible with observation.                   Effect on ADL:   Timing: Constant Modifying factors: medicine and procedures help  Howard Maxwell was last seen on 10/22/2016 for a procedure. During today's appointment we reviewed Howard Maxwell's post-procedure results, as well as his outpatient medication regimen.  Further details on both, my assessment(s), as well as the proposed treatment plan, please see below.  Controlled Substance Pharmacotherapy Assessment REMS (Risk Evaluation and Mitigation Strategy)  Analgesic: Oxycodone IR 5 mg every 6 hours (20 mg/day of oxycodone) (30 MME/day) MME/day: 30 mg/day.  Dewayne Shorter, RN  11/11/2016 11:44 AM  Addendum Nursing Pain Medication Assessment:  Safety precautions to be maintained throughout the outpatient stay will include: orient to surroundings, keep bed in low position, maintain call bell within reach at all times, provide assistance with transfer out of bed and ambulation.  Medication Inspection Compliance: Pill count conducted under aseptic conditions, in  front of the patient. Neither the pills nor the bottle was removed from the patient's sight at any time. Once count was completed pills were immediately  returned to the patient in their original bottle.  Medication: Oxycodone IR Pill/Patch Count: 7 of 120 pills remain Pill/Patch Appearance: Markings consistent with prescribed medication Bottle Appearance: Standard pharmacy container. Clearly labeled. Filled Date: 07 /24/ 2018 Last Medication intake:  Today  Patient states he is having left eye retinal prosthesis  Placed in the near future by Dr Norlene Duel [facility].  Patient has appointment with GI MD for Anemia   Pharmacokinetics: Liberation and absorption (onset of action): WNL Distribution (time to peak effect): WNL Metabolism and excretion (duration of action): WNL         Pharmacodynamics: Desired effects: Analgesia: Howard Maxwell reports >50% benefit. Functional ability: Patient reports that medication allows him to accomplish basic ADLs Clinically meaningful improvement in function (CMIF): Sustained CMIF goals met Perceived effectiveness: Described as relatively effective, allowing for increase in activities of daily living (ADL) Undesirable effects: Side-effects or Adverse reactions: None reported Monitoring: [place] PMP: Online review of the past 52-monthperiod conducted. Compliant with practice rules and regulations List of all UDS test(s) done:  Lab Results  Component Value Date   SUMMARY FINAL 08/06/2016   Last UDS on record: Summary  Date Value Ref Range Status  08/06/2016 FINAL  Final    Comment:    ==================================================================== TOXASSURE COMP DRUG ANALYSIS,UR ==================================================================== Test                             Result       Flag       Units Drug Present and Declared for Prescription Verification   Hydrocodone                    252          EXPECTED   ng/mg creat   Norhydrocodone                  345          EXPECTED   ng/mg creat    Sources of hydrocodone include scheduled prescription    medications. Norhydrocodone is an expected metabolite of    hydrocodone.   Methocarbamol                  PRESENT      EXPECTED Drug Absent but Declared for Prescription Verification   Acetaminophen                  Not Detected UNEXPECTED    Acetaminophen, as indicated in the declared medication list, is    not always detected even when used as directed.   Ibuprofen                      Not Detected UNEXPECTED    Ibuprofen, as indicated in the declared medication list, is not    always detected even when used as directed. ==================================================================== Test                      Result    Flag   Units      Ref Range   Creatinine              60               mg/dL      >=20 ==================================================================== Declared Medications:  The flagging and interpretation on this report are based on the  following declared medications.  Unexpected results may  arise from  inaccuracies in the declared medications.  **Note: The testing scope of this panel includes these medications:  Hydrocodone (Hydrocodone-Acetaminophen)  Methocarbamol  **Note: The testing scope of this panel does not include small to  moderate amounts of these reported medications:  Acetaminophen (Hydrocodone-Acetaminophen)  Ibuprofen  **Note: The testing scope of this panel does not include following  reported medications:  Atorvastatin  Azilsartan  Carvedilol  Chlorthalidone  Cholecalciferol  Eye Drops  Glipizide  Magnesium  Metformin  Pantoprazole  Potassium  Spironolactone ==================================================================== For clinical consultation, please call [phone]. ====================================================================    UDS interpretation: Compliant          Medication Assessment Form:  Reviewed. Patient indicates being compliant with therapy Treatment compliance: Compliant Risk Assessment Profile: Aberrant behavior: See prior evaluations. None observed or detected today Comorbid factors increasing risk of overdose: See prior notes. No additional risks detected today Risk of substance use disorder (SUD): Low     Opioid Risk Tool - 10/22/16 0807      Family History of Substance Abuse   Alcohol Negative   Illegal Drugs Negative   Rx Drugs Negative     Personal History of Substance Abuse   Alcohol Negative   Illegal Drugs Negative   Rx Drugs Negative     Age   Age between 29-45 years  No     History of Preadolescent Sexual Abuse   History of Preadolescent Sexual Abuse Negative or Male     Psychological Disease   Psychological Disease Negative   Depression Negative     Total Score   Opioid Risk Tool Scoring 0   Opioid Risk Interpretation Low Risk     ORT Scoring interpretation table:  Score <3 = Low Risk for SUD  Score between 4-7 = Moderate Risk for SUD  Score >8 = High Risk for Opioid Abuse   Risk Mitigation Strategies:  Patient Counseling: Covered Patient-Prescriber Agreement (PPA): Present and active  Notification to other healthcare providers: Done  Pharmacologic Plan: No change in therapy, at this time  Post-Procedure Assessment  10/22/2016 Procedure: Diagnostic bilateral L2-3 transforaminal epidural steroid injection under fluoroscopic guidance and IV sedation  Pre-procedure pain score:        /10 Post-procedure pain score: 0/10         Influential Factors: BMI: 35.87 kg/m Intra-procedural challenges: None observed.         Assessment challenges: None detected.              Reported side-effects: None.        Post-procedural adverse reactions or complications: None reported         Sedation: Sedation provided. When no sedatives are used, the analgesic levels obtained are directly associated to the effectiveness of the local anesthetics.  However, when sedation is provided, the level of analgesia obtained during the initial 1 hour following the intervention, is believed to be the result of a combination of factors. These factors may include, but are not limited to: 1. The effectiveness of the local anesthetics used. 2. The effects of the analgesic(s) and/or anxiolytic(s) used. 3. The degree of discomfort experienced by the patient at the time of the procedure. 4. The patients ability and reliability in recalling and recording the events. 5. The presence and influence of possible secondary gains and/or psychosocial factors. Reported result: Relief experienced during the 1st hour after the procedure: 100 % (Ultra-Short Term Relief)            Interpretative annotation: Clinically  appropriate result. Analgesia during this period is likely to be Local Anesthetic and/or IV Sedative (Analgesic/Anxiolytic) related.          Effects of local anesthetic: The analgesic effects attained during this period are directly associated to the localized infiltration of local anesthetics and therefore cary significant diagnostic value as to the etiological location, or anatomical origin, of the pain. Expected duration of relief is directly dependent on the pharmacodynamics of the local anesthetic used. Long-acting (4-6 hours) anesthetics used.  Reported result: Relief during the next 4 to 6 hour after the procedure: 75 % (right side started coming  back) (Short-Term Relief)            Interpretative annotation: Clinically appropriate result. Analgesia during this period is likely to be Local Anesthetic-related.          Long-term benefit: Defined as the period of time past the expected duration of local anesthetics (1 hour for short-acting and 4-6 hours for long-acting). With the possible exception of prolonged sympathetic blockade from the local anesthetics, benefits during this period are typically attributed to, or associated with, other factors such as  analgesic sensory neuropraxia, antiinflammatory effects, or beneficial biochemical changes provided by agents other than the local anesthetics.  Reported result: Extended relief following procedure: 50 % (lasting 2 days) (Long-Term Relief)            Interpretative annotation: Clinically appropriate result. Good relief. No permanent benefit expected. Inflammation plays a part in the etiology to the pain.          Current benefits: Defined as persistent relief that continues at this point in time.   Reported results: Treated area: <25 % Howard Maxwell reports improvement in function Interpretative annotation: Recurrence of symptoms. No permanent benefit expected. Effective diagnostic intervention.          Interpretation: Results would suggest a successful diagnostic intervention. We'll proceed with diagnostic intervention #2, as soon as convenient          Plan:  Repeat treatment or therapy and compare duration of benefits between them.  Laboratory Chemistry  Inflammation Markers (CRP: Acute Phase) (ESR: Chronic Phase) Lab Results  Component Value Date   CRP <0.8 08/06/2016   ESRSEDRATE 10 08/06/2016                 Renal Function Markers Lab Results  Component Value Date   BUN 35 (H) 09/27/2016   CREATININE 0.94 09/27/2016   GFRAA >60 09/27/2016   GFRNONAA >60 09/27/2016                 Hepatic Function Markers Lab Results  Component Value Date   AST 28 08/06/2016   ALT 30 08/06/2016   ALBUMIN 4.2 08/06/2016   ALKPHOS 71 08/06/2016                 Electrolytes Lab Results  Component Value Date   NA 137 09/27/2016   K 4.6 09/27/2016   CL 100 (L) 09/27/2016   CALCIUM 9.3 09/27/2016   MG 1.7 08/06/2016                 Neuropathy Markers Lab Results  Component Value Date   VITAMINB12 209 08/06/2016                 Bone Pathology Markers Lab Results  Component Value Date   ALKPHOS 71 08/06/2016   25OHVITD1 29 (L) 08/06/2016   25OHVITD2 <1.0 08/06/2016    25OHVITD3 29 08/06/2016  CALCIUM 9.3 09/27/2016                 Coagulation Parameters Lab Results  Component Value Date   INR 1.0 01/12/2014   LABPROT 13.3 01/12/2014   APTT 33.9 01/12/2014   PLT 293 09/27/2016                 Cardiovascular Markers Lab Results  Component Value Date   BNP 319 (H) 01/12/2014   HGB 11.9 (L) 09/27/2016   HCT 35.7 (L) 09/27/2016                 Note: Lab results reviewed.  Recent Diagnostic Imaging Review  Dg C-arm 1-60 Min-no Report  Result Date: 10/22/2016 Fluoroscopy was utilized by the requesting physician.  No radiographic interpretation.   Note: Imaging results reviewed.          Meds   Current Meds  Medication Sig  . atorvastatin (LIPITOR) 40 MG tablet Take 1 tablet (40 mg total) by mouth daily.  . Azilsartan-Chlorthalidone (EDARBYCLOR) 40-25 MG TABS Take 1 tablet by mouth daily.  . carvedilol (COREG) 6.25 MG tablet Take 1 tablet (6.25 mg total) by mouth 2 (two) times daily.  . Cholecalciferol (VITAMIN D3) 1000 units CAPS Take 1 capsule by mouth daily.  . Coenzyme Q10 (COQ10) 200 MG CAPS Take 1 capsule by mouth daily.  . famotidine (PEPCID) 20 MG tablet Take 1 tablet (20 mg total) by mouth 2 (two) times daily.  . Ferrous Sulfate (SLOW FE) 142 (45 Fe) MG TBCR Take 1 tablet by mouth daily.  Marland Kitchen glipiZIDE (GLUCOTROL) 5 MG tablet Take 5 mg by mouth daily before breakfast.   . glucose blood (ONE TOUCH ULTRA TEST) test strip USE AS DIRECTED TWICE A DAY  . Magnesium Oxide 500 MG CAPS Take 1 capsule (500 mg total) by mouth 2 (two) times daily at 8 am and 10 pm.  . metFORMIN (GLUCOPHAGE) 850 MG tablet Take 850 mg by mouth 2 (two) times daily with a meal.  . naphazoline (NAPHCON) 0.1 % ophthalmic solution Place 1 drop into both eyes as needed.   Marland Kitchen oxyCODONE (OXY IR/ROXICODONE) 5 MG immediate release tablet Take 1 tablet (5 mg total) by mouth every 6 (six) hours as needed for severe pain.  Marland Kitchen Potassium 95 MG TABS Take 1 tablet by mouth daily.  Marland Kitchen  spironolactone (ALDACTONE) 25 MG tablet Take 1 tablet (25 mg total) by mouth daily.  Marland Kitchen tiZANidine (ZANAFLEX) 2 MG tablet Take by mouth.  . [DISCONTINUED] oxyCODONE (OXY IR/ROXICODONE) 5 MG immediate release tablet Take 1 tablet (5 mg total) by mouth every 6 (six) hours as needed for severe pain.    ROS  Constitutional: Denies any fever or chills Gastrointestinal: No reported hemesis, hematochezia, vomiting, or acute GI distress Musculoskeletal: Denies any acute onset joint swelling, redness, loss of ROM, or weakness Neurological: No reported episodes of acute onset apraxia, aphasia, dysarthria, agnosia, amnesia, paralysis, loss of coordination, or loss of consciousness  Allergies  Howard Maxwell is allergic to loratadine.  [place]  Drug: Howard Maxwell  reports that he does not use drugs. Alcohol:  reports that he does not drink alcohol. Tobacco:  reports that he quit smoking about 2 years ago. His smoking use included Cigarettes. He smoked 1.00 pack per day. He has never used smokeless tobacco. Medical:  has a past medical history of Anemia; Blind; Chronic back pain; COPD (chronic obstructive pulmonary disease) ([place]); Coronary artery disease; Diabetes mellitus without complication ([place]); Discitis of  lumbar region (L1-2) (08/06/2016); GERD (gastroesophageal reflux disease); Hypertension; Sinus problem; and Tobacco use. Surgical: Howard Maxwell  has a past surgical history that includes Foot surgery (Bilateral); Anterior cervical decomp/discectomy fusion; Gallbladder surgery; Femur fracture surgery; Eye surgery; Nasal reconstruction; Cardiac catheterization (12/07/2013); and Back surgery. Family: Family history is unknown by patient.  Constitutional Exam  General appearance: Well nourished, well developed, and well hydrated. In no apparent acute distress Vitals:   11/11/16 1118 11/11/16 1120  BP:  (!) 122/53  Pulse: (!) 57   Resp: 16   Temp: 98.2 F (36.8 C)   SpO2: 98%   Weight: 250 lb (113.4  kg)   Height: 5' 10"  (1.778 m)    BMI Assessment: Estimated body mass index is 35.87 kg/m as calculated from the following:   Height as of this encounter: 5' 10"  (1.778 m).   Weight as of this encounter: 250 lb (113.4 kg).  BMI interpretation table: BMI level Category Range association with higher incidence of chronic pain  <18 kg/m2 Underweight   18.5-24.9 kg/m2 Ideal body weight   25-29.9 kg/m2 Overweight Increased incidence by 20%  30-34.9 kg/m2 Obese (Class I) Increased incidence by 68%  35-39.9 kg/m2 Severe obesity (Class II) Increased incidence by 136%  >40 kg/m2 Extreme obesity (Class III) Increased incidence by 254%   BMI Readings from Last 4 Encounters:  11/11/16 35.87 kg/m  10/22/16 35.87 kg/m  10/13/16 36.59 kg/m  09/27/16 36.59 kg/m   Wt Readings from Last 4 Encounters:  11/11/16 250 lb (113.4 kg)  10/22/16 250 lb (113.4 kg)  10/13/16 255 lb (115.7 kg)  09/27/16 255 lb (115.7 kg)  Psych/Mental status: Alert, oriented x 3 (person, place, & time)       Eyes: PERLA Respiratory: No evidence of acute respiratory distress  Cervical Spine Area Exam  Skin & Axial Inspection: No masses, redness, edema, swelling, or associated skin lesions Alignment: Symmetrical Functional ROM: Unrestricted ROM      Stability: No instability detected Muscle Tone/Strength: Functionally intact. No obvious neuro-muscular anomalies detected. Sensory (Neurological): Unimpaired Palpation: No palpable anomalies              Upper Extremity (UE) Exam    Side: Right upper extremity  Side: Left upper extremity  Skin & Extremity Inspection: Skin color, temperature, and hair growth are WNL. No peripheral edema or cyanosis. No masses, redness, swelling, asymmetry, or associated skin lesions. No contractures.  Skin & Extremity Inspection: Skin color, temperature, and hair growth are WNL. No peripheral edema or cyanosis. No masses, redness, swelling, asymmetry, or associated skin lesions. No  contractures.  Functional ROM: Unrestricted ROM          Functional ROM: Unrestricted ROM          Muscle Tone/Strength: Functionally intact. No obvious neuro-muscular anomalies detected.  Muscle Tone/Strength: Functionally intact. No obvious neuro-muscular anomalies detected.  Sensory (Neurological): Unimpaired  Sensory (Neurological): Unimpaired  Palpation: No palpable anomalies              Palpation: No palpable anomalies              Specialized Test(s): Deferred         Specialized Test(s): Deferred          Thoracic Spine Area Exam  Skin & Axial Inspection: No masses, redness, or swelling Alignment: Symmetrical Functional ROM: Unrestricted ROM Stability: No instability detected Muscle Tone/Strength: Functionally intact. No obvious neuro-muscular anomalies detected. Sensory (Neurological): Unimpaired Muscle strength & Tone: No palpable anomalies  Lumbar Spine Area Exam  Skin & Axial Inspection: No masses, redness, or swelling Alignment: Symmetrical Functional ROM: Decreased ROM      Stability: No instability detected Muscle Tone/Strength: Functionally intact. No obvious neuro-muscular anomalies detected. Sensory (Neurological): Movement-associated discomfort Palpation: Complains of area being tender to palpation       Provocative Tests: Lumbar Hyperextension and rotation test: evaluation deferred today       Lumbar Lateral bending test: evaluation deferred today       Patrick's Maneuver: evaluation deferred today                    Gait & Posture Assessment  Ambulation: Limited Gait: Antalgic Posture: Antalgic   Lower Extremity Exam    Side: Right lower extremity  Side: Left lower extremity  Skin & Extremity Inspection: Skin color, temperature, and hair growth are WNL. No peripheral edema or cyanosis. No masses, redness, swelling, asymmetry, or associated skin lesions. No contractures.  Skin & Extremity Inspection: Skin color, temperature, and hair growth are WNL. No  peripheral edema or cyanosis. No masses, redness, swelling, asymmetry, or associated skin lesions. No contractures.  Functional ROM: Unrestricted ROM          Functional ROM: Unrestricted ROM          Muscle Tone/Strength: Functionally intact. No obvious neuro-muscular anomalies detected.  Muscle Tone/Strength: Functionally intact. No obvious neuro-muscular anomalies detected.  Sensory (Neurological): Unimpaired  Sensory (Neurological): Unimpaired  Palpation: No palpable anomalies  Palpation: No palpable anomalies   Assessment  Primary Diagnosis & Pertinent Problem List: The primary encounter diagnosis was Lumbar radiculopathy (Bilateral). 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  Status Diagnosis  Improving Recurring Persistent 1. Lumbar radiculopathy (Bilateral)   2. Chronic low back pain (Location of Primary Source of Pain) (Bilateral) (R>L)   3. 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 Lumbar DDD (degenerative disc disease)   6. 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 Chronic pain of both upper extremities   8. Chronic thoracic radiculitis (Fourth Area of Pain) (Bilateral) (T8)   9. Chronic pain syndrome     Problems updated and reviewed during this visit: Problem  Chronic upper extremity pain (Primary Area of Pain) (Bilateral) (L>R)  Chronic thoracic radiculitis (Fourth Area of Pain) (Bilateral) (T8)  Chronic low back pain  (Secondary Area of Pain) (Bilateral) (R>L)   Plan of Care  Pharmacotherapy (Medications Ordered): Meds ordered this encounter  Medications  . oxyCODONE (OXY IR/ROXICODONE) 5 MG immediate release tablet    Sig: Take 1 tablet (5 mg total) by mouth every 6 (six) hours as needed for severe pain.    Dispense:  120 tablet    Refill:  0    Do not place this medication, or any other prescription from our practice, on "Automatic Refill". Patient may have prescription filled one day early if pharmacy is closed on scheduled refill date. Do not fill until: 11/12/16 To last until: 12/12/16   New Prescriptions   No medications on file  Medications administered today: Howard Maxwell had no medications administered during this visit.   Procedure Orders     Lumbar Transforaminal Epidural Lab Orders  No laboratory test(s) ordered today   Imaging Orders  No imaging studies ordered today   Referral Orders  No referral(s) requested today    Interventional management options: Planned, scheduled, and/or pending:   Diagnostic bilateral L2-3 TFESI + Right L1-2 TFESI under fluoroscopic guidance and IV sedation    Considering:   Thefirst stepis to identify to identify whether or not the patient is having discitis and/or osteomyelitis.  Diagnostic bilateral lumbar facetblock  Possible bilateral lumbar facet RFA Diagnostic L3-4 interspinous ligament block Possible L3-4 bilateral medial branch RFA Diagnostic right sided L2-3 and L3-4 lumbar epiduralsteroid injection  Diagnostic bilateral L1-2 &L4-5 transforaminalepidural steroid injection  Diagnostic bilateral L2-3 transforaminal epidural steroid injection  Diagnostic caudal epidural steroid injection + diagnostic epidurogram Possible Racz procedure Diagnostic bilateral intra-articular hipjoint injection  Diagnostic bilateral femoral nerve and obturator nerveblock  Possible bilateral femoral nerve and obturator nerve RFA Diagnostic  left cervical epidural steroid injection  Diagnostic bilateral cervical facet block Possible bilateral cervical facet RFA Diagnosticleft intra-articular knee injectionwith local anesthetic and steroid  Possible series of 5 left intra-articular [place]nee injection  Diagnostic left Genicular nerve block Possible left Genicular nerve radiofrequencyablation  Possible intrathecal opioid trial   Palliative PRN treatment(s):   None at this time    Provider-requested follow-up: Return for Procedure (with sedation): TFESI.  Future Appointments Date Time Provider [place]  12/01/2016 8:15 AM Milinda Pointer, MD [facility] None  12/09/2016 10:00 AM Milinda Pointer, MD [facility] None  12/11/2016 10:40 AM Wellington Hampshire, MD [facility]   Primary Care Physician: Tracie Harrier, MD Location: [facility] Note by: Gaspar Cola, MD Date: 11/11/2016; Time: 12:47 PM

## 2016-11-12 DIAGNOSIS — M79602 Pain in left arm: Secondary | ICD-10-CM

## 2016-11-12 DIAGNOSIS — M79601 Pain in right arm: Secondary | ICD-10-CM

## 2016-11-12 DIAGNOSIS — M5414 Radiculopathy, thoracic region: Secondary | ICD-10-CM | POA: Insufficient documentation

## 2016-11-12 DIAGNOSIS — G8929 Other chronic pain: Secondary | ICD-10-CM | POA: Insufficient documentation

## 2016-11-25 DIAGNOSIS — G629 Polyneuropathy, unspecified: Secondary | ICD-10-CM | POA: Insufficient documentation

## 2016-12-01 ENCOUNTER — Encounter: Payer: Self-pay | Admitting: Pain Medicine

## 2016-12-01 ENCOUNTER — Ambulatory Visit (HOSPITAL_BASED_OUTPATIENT_CLINIC_OR_DEPARTMENT_OTHER): Payer: Medicare Other | Admitting: Pain Medicine

## 2016-12-01 ENCOUNTER — Ambulatory Visit
Admission: RE | Admit: 2016-12-01 | Discharge: 2016-12-01 | Disposition: A | Discharge status: Home / Self Care | Payer: Medicare Other | Source: Ambulatory Visit | Attending: Pain Medicine | Admitting: Pain Medicine

## 2016-12-01 VITALS — BP 137/70 | HR 61 | Temp 97.4°F | Resp 16 | Ht 70.0 in | Wt 248.0 lb

## 2016-12-01 DIAGNOSIS — M25562 Pain in left knee: Secondary | ICD-10-CM | POA: Diagnosis present

## 2016-12-01 DIAGNOSIS — M47896 Other spondylosis, lumbar region: Secondary | ICD-10-CM | POA: Diagnosis not present

## 2016-12-01 DIAGNOSIS — M5416 Radiculopathy, lumbar region: Secondary | ICD-10-CM | POA: Diagnosis not present

## 2016-12-01 DIAGNOSIS — M48061 Spinal stenosis, lumbar region without neurogenic claudication: Secondary | ICD-10-CM | POA: Diagnosis not present

## 2016-12-01 DIAGNOSIS — M47816 Spondylosis without myelopathy or radiculopathy, lumbar region: Secondary | ICD-10-CM

## 2016-12-01 DIAGNOSIS — M961 Postlaminectomy syndrome, not elsewhere classified: Secondary | ICD-10-CM

## 2016-12-01 DIAGNOSIS — M549 Dorsalgia, unspecified: Secondary | ICD-10-CM | POA: Diagnosis present

## 2016-12-01 MED ORDER — LIDOCAINE HCL 2 % IJ SOLN
10.0000 mL | Freq: Once | INTRAMUSCULAR | Status: AC
  Administered 2016-12-01: 200 mg

## 2016-12-01 MED ORDER — SODIUM CHLORIDE 0.9% FLUSH
2.0000 mL | Freq: Once | INTRAVENOUS | Status: AC
  Administered 2016-12-01: 10 mL

## 2016-12-01 MED ORDER — IOPAMIDOL (ISOVUE-M 200) INJECTION 41%
10.0000 mL | Freq: Once | INTRAMUSCULAR | Status: AC
  Administered 2016-12-01: 10 mL via EPIDURAL
  Filled 2016-12-01: qty 10

## 2016-12-01 MED ORDER — LIDOCAINE HCL 2 % IJ SOLN
INTRAMUSCULAR | Status: AC
  Filled 2016-12-01: qty 20

## 2016-12-01 MED ORDER — MIDAZOLAM HCL 5 MG/5ML IJ SOLN
1.0000 mg | INTRAMUSCULAR | Status: DC | PRN
  Administered 2016-12-01: 2 mg via INTRAVENOUS
  Filled 2016-12-01: qty 5

## 2016-12-01 MED ORDER — TRIAMCINOLONE ACETONIDE 40 MG/ML IJ SUSP
40.0000 mg | Freq: Once | INTRAMUSCULAR | Status: AC
  Administered 2016-12-01: 40 mg
  Filled 2016-12-01: qty 1

## 2016-12-01 MED ORDER — LACTATED RINGERS IV SOLN
1000.0000 mL | Freq: Once | INTRAVENOUS | Status: AC
  Administered 2016-12-01: 1000 mL via INTRAVENOUS

## 2016-12-01 MED ORDER — ROPIVACAINE HCL 2 MG/ML IJ SOLN
2.0000 mL | Freq: Once | INTRAMUSCULAR | Status: AC
  Administered 2016-12-01: 2 mL via EPIDURAL
  Filled 2016-12-01: qty 10

## 2016-12-01 MED ORDER — FENTANYL CITRATE (PF) 100 MCG/2ML IJ SOLN
25.0000 ug | INTRAMUSCULAR | Status: DC | PRN
  Administered 2016-12-01: 50 ug via INTRAVENOUS
  Filled 2016-12-01: qty 2

## 2016-12-01 MED ORDER — SODIUM CHLORIDE 0.9 % IJ SOLN
INTRAMUSCULAR | Status: AC
  Filled 2016-12-01: qty 10

## 2016-12-01 NOTE — Progress Notes (Signed)
Safety precautions to be maintained throughout the outpatient stay will include: orient to surroundings, keep bed in low position, maintain call bell within reach at all times, provide assistance with transfer out of bed and ambulation.  

## 2016-12-01 NOTE — Progress Notes (Signed)
Patient's Name: Howard Maxwell  MRN: [id number]  Referring Provider: Tracie Harrier, MD  DOB: [birth year]  PCP: Tracie Harrier, MD  DOS: 12/01/2016  Note by: Gaspar Cola, MD  Service setting: Ambulatory outpatient  Specialty: Interventional Pain Management  Patient type: Established  Location: [facility] (AMB) Pain Management Facility  Visit type: Interventional Procedure   Primary Reason for Visit: Interventional Pain Management Treatment. CC: Back Pain (mid) and Knee Pain (left)  Procedure:  Anesthesia, Analgesia, Anxiolysis:  Type: Therapeutic Inter-Laminar Epidural Steroid Injection Region: Lumbar Level: L3-4 Level. Laterality: Right-Sided Paramedial  Type: Local Anesthesia with Moderate (Conscious) Sedation Local Anesthetic: Lidocaine 1% Route: Intravenous (IV) IV Access: Secured Sedation: Meaningful verbal contact was maintained at all times during the procedure  Indication(s): Analgesia and Anxiety  Indications: 1. Lumbar central spinal stenosis (L2-3 and L3-4)   2. Lumbar radiculopathy (Bilateral)   3. 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 Osteoarthritis of lumbar spine    Pain Score: Pre-procedure: 2 /10 Post-procedure: 0-No pain/10  Pre-op Assessment:  Howard Maxwell is a 71 y.o. (year old), male patient, seen today for interventional treatment. He  has a past surgical history that includes Foot surgery (Bilateral); Anterior cervical decomp/discectomy fusion; Gallbladder surgery; Femur fracture surgery; Eye surgery; Nasal reconstruction; Cardiac catheterization (12/07/2013); and Back surgery. Howard Maxwell has a current medication list which includes the following prescription(s): atorvastatin, azilsartan-chlorthalidone, carvedilol, vitamin d3, coq10, famotidine, ferrous sulfate, glipizide, glucose blood, magnesium oxide, metformin, misc natural products, naphazoline, neomycin-polymyxin-dexameth, oxycodone, polyethyl glycol-propyl glycol, potassium,  spironolactone, tizanidine, and vitamin c, and the following Facility-Administered Medications: fentanyl and midazolam. His primarily concern today is the Back Pain (mid) and Knee Pain (left)  Initial Vital Signs: Blood pressure (!) 138/58, pulse 61, temperature 98.3 F (36.8 C), resp. rate 16, height 5\' 10"  (1.778 m), weight 248 lb (112.5 kg), SpO2 97 %. BMI: Estimated body mass index is 35.58 kg/m as calculated from the following:   Height as of this encounter: 5\' 10"  (1.778 m).   Weight as of this encounter: 248 lb (112.5 kg).  Risk Assessment: Allergies: Reviewed. He is allergic to loratadine. (Claritin - terrible taste in his mouth, not an allergy) Allergy Precautions: None required Coagulopathies: Reviewed. None identified.  Blood-thinner therapy: None at this time Active Infection(s): Reviewed. None identified. Howard Maxwell is afebrile  Site Confirmation: Howard Maxwell was asked to confirm the procedure and laterality before marking the site Procedure checklist: Completed Consent: Before the procedure and under the influence of no sedative(s), amnesic(s), or anxiolytics, the patient was informed of the treatment options, risks and possible complications. To fulfill our ethical and legal obligations, as recommended by the American Medical Association's Code of Ethics, I have informed the patient of my clinical impression; the nature and purpose of the treatment or procedure; the risks, benefits, and possible complications of the intervention; the alternatives, including doing nothing; the risk(s) and benefit(s) of the alternative treatment(s) or procedure(s); and the risk(s) and benefit(s) of doing nothing. The patient was provided information about the general risks and possible complications associated with the procedure. These may include, but are not limited to: failure to achieve desired goals, infection, bleeding, organ or nerve damage, allergic reactions, paralysis, and death. In  addition, the patient was informed of those risks and complications associated to Spine-related procedures, such as failure to decrease pain; infection (i.e.: Meningitis, epidural or intraspinal abscess); bleeding (i.e.: epidural hematoma, subarachnoid hemorrhage, or any other type of intraspinal or peri-dural bleeding); organ or nerve damage (  i.e.: Any type of peripheral nerve, nerve root, or spinal cord injury) with subsequent damage to sensory, motor, and/or autonomic systems, resulting in permanent pain, numbness, and/or weakness of one or several areas of the body; allergic reactions; (i.e.: anaphylactic reaction); and/or death. Furthermore, the patient was informed of those risks and complications associated with the medications. These include, but are not limited to: allergic reactions (i.e.: anaphylactic or anaphylactoid reaction(s)); adrenal axis suppression; blood sugar elevation that in diabetics may result in ketoacidosis or comma; water retention that in patients with history of congestive heart failure may result in shortness of breath, pulmonary edema, and decompensation with resultant heart failure; weight gain; swelling or edema; medication-induced neural toxicity; particulate matter embolism and blood vessel occlusion with resultant organ, and/or nervous system infarction; and/or aseptic necrosis of one or more joints. Finally, the patient was informed that Medicine is not an exact science; therefore, there is also the possibility of unforeseen or unpredictable risks and/or possible complications that may result in a catastrophic outcome. The patient indicated having understood very clearly. We have given the patient no guarantees and we have made no promises. Enough time was given to the patient to ask questions, all of which were answered to the patient's satisfaction. Howard Maxwell has indicated that he wanted to continue with the procedure. Attestation: I, the ordering provider, attest that I  have discussed with the patient the benefits, risks, side-effects, alternatives, likelihood of achieving goals, and potential problems during recovery for the procedure that I have provided informed consent. Date: 12/01/2016; Time: 9:04 AM  Pre-Procedure Preparation:  Monitoring: As per clinic protocol. Respiration, ETCO2, SpO2, BP, heart rate and rhythm monitor placed and checked for adequate function Safety Precautions: Patient was assessed for positional comfort and pressure points before starting the procedure. Time-out: I initiated and conducted the "Time-out" before starting the procedure, as per protocol. The patient was asked to participate by confirming the accuracy of the "Time Out" information. Verification of the correct person, site, and procedure were performed and confirmed by me, the nursing staff, and the patient. "Time-out" conducted as per Joint Commission's Universal Protocol (UP.01.01.01). "Time-out" Date & Time: 12/01/2016; 0930 hrs.  Description of Procedure Process:   Position: Prone with head of the table was raised to facilitate breathing. Target Area: The interlaminar space, initially targeting the lower laminar border of the superior vertebral body. Approach: Paramedial approach. Area Prepped: Entire Posterior Lumbar Region Prepping solution: ChloraPrep (2% chlorhexidine gluconate and 70% isopropyl alcohol) Safety Precautions: Aspiration looking for blood return was conducted prior to all injections. At no point did we inject any substances, as a needle was being advanced. No attempts were made at seeking any paresthesias. Safe injection practices and needle disposal techniques used. Medications properly checked for expiration dates. SDV (single dose vial) medications used. Description of the Procedure: Protocol guidelines were followed. The procedure needle was introduced through the skin, ipsilateral to the reported pain, and advanced to the target area. Bone was contacted  and the needle walked caudad, until the lamina was cleared. The epidural space was identified using "loss-of-resistance technique" with 2-3 ml of PF-NaCl (0.9% NSS), in a 5cc LOR glass syringe. Vitals:   12/01/16 0940 12/01/16 0950 12/01/16 1000 12/01/16 1010  BP: (!) 144/77 127/63 (!) 150/67 137/70  Pulse:      Resp: 20 18 18 16   Temp:  (!) 97.4 F (36.3 C)    SpO2: 95% 94% 95% 94%  Weight:      Height:  Start Time: 0930 hrs. End Time: 0940 hrs. Materials:  Needle(s) Type: Epidural needle Gauge: 17G Length: 3.5-in Medication(s): We administered lactated ringers, midazolam, fentaNYL, iopamidol, triamcinolone acetonide, ropivacaine (PF) 2 mg/mL (0.2%), sodium chloride flush, and lidocaine. Please see chart orders for dosing details.  Imaging Guidance (Spinal):  Type of Imaging Technique: Fluoroscopy Guidance (Spinal) Indication(s): Assistance in needle guidance and placement for procedures requiring needle placement in or near specific anatomical locations not easily accessible without such assistance. Exposure Time: Please see nurses notes. Contrast: Before injecting any contrast, we confirmed that the patient did not have an allergy to iodine, shellfish, or radiological contrast. Once satisfactory needle placement was completed at the desired level, radiological contrast was injected. Contrast injected under live fluoroscopy. No contrast complications. See chart for type and volume of contrast used. Fluoroscopic Guidance: I was personally present during the use of fluoroscopy. "Tunnel Vision Technique" used to obtain the best possible view of the target area. Parallax error corrected before commencing the procedure. "Direction-depth-direction" technique used to introduce the needle under continuous pulsed fluoroscopy. Once target was reached, antero-posterior, oblique, and lateral fluoroscopic projection used confirm needle placement in all planes. Images permanently stored in  EMR. Interpretation: I personally interpreted the imaging intraoperatively. Adequate needle placement confirmed in multiple planes. Appropriate spread of contrast into desired area was observed. No evidence of afferent or efferent intravascular uptake. No intrathecal or subarachnoid spread observed. Permanent images saved into the patient's record.  Antibiotic Prophylaxis:  Indication(s): None identified Antibiotic given: None  Post-operative Assessment:  EBL: None Complications: No immediate post-treatment complications observed by team, or reported by patient. Note: The patient tolerated the entire procedure well. A repeat set of vitals were taken after the procedure and the patient was kept under observation following institutional policy, for this type of procedure. Post-procedural neurological assessment was performed, showing return to baseline, prior to discharge. The patient was provided with post-procedure discharge instructions, including a section on how to identify potential problems. Should any problems arise concerning this procedure, the patient was given instructions to immediately contact us, at any time, without hesitation. In any case, we plan to contact the patient by telephone for a follow-up status report regarding this interventional procedure. Comments:  No additional relevant information.  Plan of Care  Disposition: Discharge home  Discharge Date & Time: 12/01/2016; 1011 hrs.   Physician-requested Follow-up:  Return for post-procedure eval by Dr. Dossie Arbour in 2 weeks.  Future Appointments Date Time Provider [place]  12/09/2016 10:00 AM Milinda Pointer, MD [facility] None  12/11/2016 10:40 AM Wellington Hampshire, MD [facility]  12/28/2016 10:15 AM Milinda Pointer, MD [facility] None    Imaging Orders     DG C-Arm 1-60 Min-No Report  Procedure Orders     Lumbar Epidural Injection  Medications ordered for procedure: Meds ordered this encounter   Medications  . lactated ringers infusion 1,000 mL  . midazolam (VERSED) 5 MG/5ML injection 1-2 mg    Make sure Flumazenil is available in the pyxis when using this medication. If oversedation occurs, administer 0.2 mg IV over 15 sec. If after 45 sec no response, administer 0.2 mg again over 1 min; may repeat at 1 min intervals; not to exceed 4 doses (1 mg)  . fentaNYL (SUBLIMAZE) injection 25-50 mcg    Make sure Narcan is available in the pyxis when using this medication. In the event of respiratory depression (RR< 8/min): Titrate NARCAN (naloxone) in increments of 0.1 to 0.2 mg IV at 2-3 minute  intervals, until desired degree of reversal.  . iopamidol (ISOVUE-M) 41 % intrathecal injection 10 mL  . triamcinolone acetonide (KENALOG-40) injection 40 mg  . ropivacaine (PF) 2 mg/mL (0.2%) (NAROPIN) injection 2 mL  . sodium chloride flush (NS) 0.9 % injection 2 mL  . lidocaine (XYLOCAINE) 2 % (with pres) injection 200 mg   Medications administered: We administered lactated ringers, midazolam, fentaNYL, iopamidol, triamcinolone acetonide, ropivacaine (PF) 2 mg/mL (0.2%), sodium chloride flush, and lidocaine.  See the medical record for exact dosing, route, and time of administration.  New Prescriptions   No medications on file   Primary Care Physician: Tracie Harrier, MD Location: [facility] Facility Note by: Gaspar Cola, MD Date: 12/01/2016; Time: 1:11 PM  Disclaimer:  Medicine is not an Chief Strategy Officer. The only guarantee in medicine is that nothing is guaranteed. It is important to note that the decision to proceed with this intervention was based on the information collected from the patient. The Data and conclusions were drawn from the patient's questionnaire, the interview, and the physical examination. Because the information was provided in large part by the patient, it cannot be guaranteed that it has not been purposely or unconsciously manipulated. Every  effort has been made to obtain as much relevant data as possible for this evaluation. It is important to note that the conclusions that lead to this procedure are derived in large part from the available data. Always take into account that the treatment will also be dependent on availability of resources and existing treatment guidelines, considered by other Pain Management Practitioners as being common knowledge and practice, at the time of the intervention. For Medico-Legal purposes, it is also important to point out that variation in procedural techniques and pharmacological choices are the acceptable norm. The indications, contraindications, technique, and results of the above procedure should only be interpreted and judged by a Board-Certified Interventional Pain Specialist with extensive familiarity and expertise in the same exact procedure and technique.

## 2016-12-01 NOTE — Patient Instructions (Addendum)
____________________________________________________________________________________________  Post-Procedure instructions Instructions:  Apply ice: Fill a plastic sandwich bag with crushed ice. Cover it with a small towel and apply to injection site. Apply for 15 minutes then remove x 15 minutes. Repeat sequence on day of procedure, until you go to bed. The purpose is to minimize swelling and discomfort after procedure.  Apply heat: Apply heat to procedure site starting the day following the procedure. The purpose is to treat any soreness and discomfort from the procedure.  Food intake: Start with clear liquids (like water) and advance to regular food, as tolerated.   Physical activities: Keep activities to a minimum for the first 8 hours after the procedure.   Driving: If you have received any sedation, you are not allowed to drive for 24 hours after your procedure.  Blood thinner: Restart your blood thinner 6 hours after your procedure. (Only for those taking blood thinners)  Insulin: As soon as you can eat, you may resume your normal dosing schedule. (Only for those taking insulin)  Infection prevention: Keep procedure site clean and dry.  Post-procedure Pain Diary: Extremely important that this be done correctly and accurately. Recorded information will be used to determine the next step in treatment.  Pain evaluated is that of treated area only. Do not include pain from an untreated area.  Complete every hour, on the hour, for the initial 8 hours. Set an alarm to help you do this part accurately.  Do not go to sleep and have it completed later. It will not be accurate.  Follow-up appointment: Keep your follow-up appointment after the procedure. Usually 2 weeks for most procedures. (6 weeks in the case of radiofrequency.) Bring you pain diary.  Expect:  From numbing medicine (AKA: Local Anesthetics): Numbness or decrease in pain.  Onset: Full effect within 15 minutes of  injected.  Duration: It will depend on the type of local anesthetic used. On the average, 1 to 8 hours.   From steroids: Decrease in swelling or inflammation. Once inflammation is improved, relief of the pain will follow.  Onset of benefits: Depends on the amount of swelling present. The more swelling, the longer it will take for the benefits to be seen. In some cases, up to 10 days.  Duration: Steroids will stay in the system x 2 weeks. Duration of benefits will depend on multiple posibilities including persistent irritating factors.  From procedure: Some discomfort is to be expected once the numbing medicine wears off. This should be minimal if ice and heat are applied as instructed. Call if:  You experience numbness and weakness that gets worse with time, as opposed to wearing off.  New onset bowel or bladder incontinence. (Spinal procedures only)  Emergency Numbers:  Durning business hours (Monday - Thursday, 8:00 AM - 4:00 PM) (Friday, 9:00 AM - 12:00 Noon): [phone]  After hours: [phone] ____________________________________________________________________________________________  Pain Management Discharge Instructions  General Discharge Instructions :  If you need to reach your doctor call: Monday-Friday 8:00 am - 4:00 pm at [phone] or toll free [phone].  After clinic hours [phone] to have operator reach doctor.  Bring all of your medication bottles to all your appointments in the pain clinic.  To cancel or reschedule your appointment with Pain Management please remember to call 24 hours in advance to avoid a fee.  Refer to the educational materials which you have been given on: General Risks, I had my Procedure. Discharge Instructions, Post Sedation.  Post Procedure Instructions:  The drugs you   were given will stay in your system until tomorrow, so for the next 24 hours you should not drive, make any legal decisions or drink any alcoholic  beverages.  You may eat anything you prefer, but it is better to start with liquids then soups and crackers, and gradually work up to solid foods.  Please notify your doctor immediately if you have any unusual bleeding, trouble breathing or pain that is not related to your normal pain.  Depending on the type of procedure that was done, some parts of your body may feel week and/or numb.  This usually clears up by tonight or the next day.  Walk with the use of an assistive device or accompanied by an adult for the 24 hours.  You may use ice on the affected area for the first 24 hours.  Put ice in a Ziploc bag and cover with a towel and place against area 15 minutes on 15 minutes off.  You may switch to heat after 24 hours. Epidural Steroid Injection An epidural steroid injection is a shot of steroid medicine and numbing medicine that is given into the space between the spinal cord and the bones in your back (epidural space). The shot helps relieve pain caused by an irritated or swollen nerve root. The amount of pain relief you get from the injection depends on what is causing the nerve to be swollen and irritated, and how long your pain lasts. You are more likely to benefit from this injection if your pain is strong and comes on suddenly rather than if you have had pain for a long time. Tell a health care provider about:  Any allergies you have.  All medicines you are taking, including vitamins, herbs, eye drops, creams, and over-the-counter medicines.  Any problems you or family members have had with anesthetic medicines.  Any blood disorders you have.  Any surgeries you have had.  Any medical conditions you have.  Whether you are pregnant or may be pregnant. What are the risks? Generally, this is a safe procedure. However, problems may occur, including:  Headache.  Bleeding.  Infection.  Allergic reaction to medicines.  Damage to your nerves.  What happens before the  procedure? Staying hydrated Follow instructions from your health care provider about hydration, which may include:  Up to 2 hours before the procedure - you may continue to drink clear liquids, such as water, clear fruit juice, black coffee, and plain tea.  Eating and drinking restrictions Follow instructions from your health care provider about eating and drinking, which may include:  8 hours before the procedure - stop eating heavy meals or foods such as meat, fried foods, or fatty foods.  6 hours before the procedure - stop eating light meals or foods, such as toast or cereal.  6 hours before the procedure - stop drinking milk or drinks that contain milk.  2 hours before the procedure - stop drinking clear liquids.  Medicine  You may be given medicines to lower anxiety.  Ask your health care provider about: ? Changing or stopping your regular medicines. This is especially important if you are taking diabetes medicines or blood thinners. ? Taking medicines such as aspirin and ibuprofen. These medicines can thin your blood. Do not take these medicines before your procedure if your health care provider instructs you not to. General instructions  Plan to have someone take you home from the hospital or clinic. What happens during the procedure?  You may receive a medicine  to help you relax (sedative).  You will be asked to lie on your abdomen.  The injection site will be cleaned.  A numbing medicine (local anesthetic) will be used to numb the injection site.  A needle will be inserted through your skin into the epidural space. You may feel some discomfort when this happens. An X-ray machine will be used to make sure the needle is put as close as possible to the affected nerve.  A steroid medicine and a local anesthetic will be injected into the epidural space.  The needle will be removed.  A bandage (dressing) will be put over the injection site. What happens after the  procedure?  Your blood pressure, heart rate, breathing rate, and blood oxygen level will be monitored until the medicines you were given have worn off.  Your arm or leg may feel weak or numb for a few hours.  The injection site may feel sore.  Do not drive for 24 hours if you received a sedative. This information is not intended to replace advice given to you by your health care provider. Make sure you discuss any questions you have with your health care provider. Document Released: 06/16/2007 Document Revised: 08/21/2015 Document Reviewed: 06/25/2015 Elsevier Interactive Patient Education  2017 [employer].

## 2016-12-02 ENCOUNTER — Telehealth: Payer: Self-pay

## 2016-12-02 NOTE — Telephone Encounter (Signed)
Denies any needs at this time. Stes he is doing fine. Instructed to call if needed.

## 2016-12-08 ENCOUNTER — Ambulatory Visit: Admit: 2016-12-08 | Payer: Medicare Other | Admitting: Ophthalmology

## 2016-12-08 SURGERY — PHACOEMULSIFICATION, CATARACT, WITH IOL INSERTION
Anesthesia: Choice | Laterality: Right

## 2016-12-09 ENCOUNTER — Encounter: Payer: Self-pay | Admitting: Pain Medicine

## 2016-12-09 ENCOUNTER — Ambulatory Visit: Payer: Medicare Other | Attending: Pain Medicine | Admitting: Pain Medicine

## 2016-12-09 VITALS — BP 116/61 | HR 62 | Temp 98.0°F | Resp 18 | Ht 70.0 in | Wt 246.0 lb

## 2016-12-09 DIAGNOSIS — M9983 Other biomechanical lesions of lumbar region: Secondary | ICD-10-CM

## 2016-12-09 DIAGNOSIS — G8929 Other chronic pain: Secondary | ICD-10-CM | POA: Diagnosis not present

## 2016-12-09 DIAGNOSIS — E785 Hyperlipidemia, unspecified: Secondary | ICD-10-CM | POA: Diagnosis not present

## 2016-12-09 DIAGNOSIS — M79672 Pain in left foot: Secondary | ICD-10-CM | POA: Insufficient documentation

## 2016-12-09 DIAGNOSIS — K219 Gastro-esophageal reflux disease without esophagitis: Secondary | ICD-10-CM | POA: Diagnosis not present

## 2016-12-09 DIAGNOSIS — M542 Cervicalgia: Secondary | ICD-10-CM | POA: Insufficient documentation

## 2016-12-09 DIAGNOSIS — M25551 Pain in right hip: Secondary | ICD-10-CM | POA: Insufficient documentation

## 2016-12-09 DIAGNOSIS — M1610 Unilateral primary osteoarthritis, unspecified hip: Secondary | ICD-10-CM | POA: Insufficient documentation

## 2016-12-09 DIAGNOSIS — M48061 Spinal stenosis, lumbar region without neurogenic claudication: Secondary | ICD-10-CM | POA: Insufficient documentation

## 2016-12-09 DIAGNOSIS — M25562 Pain in left knee: Secondary | ICD-10-CM | POA: Insufficient documentation

## 2016-12-09 DIAGNOSIS — Z87891 Personal history of nicotine dependence: Secondary | ICD-10-CM | POA: Insufficient documentation

## 2016-12-09 DIAGNOSIS — I471 Supraventricular tachycardia: Secondary | ICD-10-CM | POA: Insufficient documentation

## 2016-12-09 DIAGNOSIS — J449 Chronic obstructive pulmonary disease, unspecified: Secondary | ICD-10-CM | POA: Insufficient documentation

## 2016-12-09 DIAGNOSIS — M25552 Pain in left hip: Secondary | ICD-10-CM | POA: Insufficient documentation

## 2016-12-09 DIAGNOSIS — F1190 Opioid use, unspecified, uncomplicated: Secondary | ICD-10-CM

## 2016-12-09 DIAGNOSIS — M4826 Kissing spine, lumbar region: Secondary | ICD-10-CM | POA: Diagnosis not present

## 2016-12-09 DIAGNOSIS — G894 Chronic pain syndrome: Secondary | ICD-10-CM | POA: Diagnosis not present

## 2016-12-09 DIAGNOSIS — Z79891 Long term (current) use of opiate analgesic: Secondary | ICD-10-CM | POA: Insufficient documentation

## 2016-12-09 DIAGNOSIS — R531 Weakness: Secondary | ICD-10-CM | POA: Insufficient documentation

## 2016-12-09 DIAGNOSIS — E1140 Type 2 diabetes mellitus with diabetic neuropathy, unspecified: Secondary | ICD-10-CM | POA: Insufficient documentation

## 2016-12-09 DIAGNOSIS — M5414 Radiculopathy, thoracic region: Secondary | ICD-10-CM | POA: Insufficient documentation

## 2016-12-09 DIAGNOSIS — M4626 Osteomyelitis of vertebra, lumbar region: Secondary | ICD-10-CM | POA: Diagnosis not present

## 2016-12-09 DIAGNOSIS — M4802 Spinal stenosis, cervical region: Secondary | ICD-10-CM | POA: Insufficient documentation

## 2016-12-09 DIAGNOSIS — Z6835 Body mass index (BMI) 35.0-35.9, adult: Secondary | ICD-10-CM | POA: Diagnosis not present

## 2016-12-09 DIAGNOSIS — M1712 Unilateral primary osteoarthritis, left knee: Secondary | ICD-10-CM | POA: Diagnosis not present

## 2016-12-09 DIAGNOSIS — I10 Essential (primary) hypertension: Secondary | ICD-10-CM | POA: Insufficient documentation

## 2016-12-09 DIAGNOSIS — D649 Anemia, unspecified: Secondary | ICD-10-CM | POA: Insufficient documentation

## 2016-12-09 DIAGNOSIS — M545 Low back pain: Secondary | ICD-10-CM | POA: Diagnosis present

## 2016-12-09 DIAGNOSIS — M5116 Intervertebral disc disorders with radiculopathy, lumbar region: Secondary | ICD-10-CM | POA: Diagnosis not present

## 2016-12-09 DIAGNOSIS — M7138 Other bursal cyst, other site: Secondary | ICD-10-CM | POA: Insufficient documentation

## 2016-12-09 DIAGNOSIS — M791 Myalgia: Secondary | ICD-10-CM | POA: Diagnosis not present

## 2016-12-09 DIAGNOSIS — I25110 Atherosclerotic heart disease of native coronary artery with unstable angina pectoris: Secondary | ICD-10-CM | POA: Insufficient documentation

## 2016-12-09 DIAGNOSIS — E559 Vitamin D deficiency, unspecified: Secondary | ICD-10-CM | POA: Insufficient documentation

## 2016-12-09 MED ORDER — OXYCODONE HCL 5 MG PO TABS: 5.0000 mg | ORAL_TABLET | Freq: Four times a day (QID) | ORAL | 0 refills | Status: DC | PRN

## 2016-12-09 NOTE — Progress Notes (Signed)
Patient's Name: Howard Maxwell  MRN: [id number]  Referring Provider: Tracie Harrier, MD  DOB: [birth year]  PCP: Tracie Harrier, MD  DOS: 12/09/2016  Note by: Gaspar Cola, MD  Service setting: Ambulatory outpatient  Specialty: Interventional Pain Management  Location: [facility] (AMB) Pain Management Facility    Patient type: Established   Primary Reason(s) for Visit: Encounter for prescription drug management & post-procedure evaluation of chronic illness with mild to moderate exacerbation(Level of risk: moderate) CC: Back Pain (low); Foot Pain (left); and Knee Pain (left)  HPI  Howard Maxwell is a 71 y.o. year old, male patient, who comes today for a post-procedure evaluation and medication management. 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); Musculoskeletal pain; Osteoarthritis of lumbar spine; Chronic low back pain (Secondary Area of Pain) (Bilateral) (R>L); Chronic hip pain (Location of Secondary source of pain) (Bilateral) (R>L); Chronic neck pain (Location of Tertiary source of pain) (Bilateral) (L>R); Chronic knee pain (Left); 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, C5-6, C6-7; 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) (s/p ACDF); 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); 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); Tricompartmental disease of knee (Left); 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); 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); 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); GERD (gastroesophageal reflux disease); Vitamin D insufficiency; Long term (current) use of opiate analgesic; Long term prescription opiate use; Lumbar facet syndrome (Bilateral) (R>L); Lumbar facet arthropathy (HCC) (Bilateral & Multilevel) (L2-3 to L5-S1); Lumbar facet joint synovial cysts (Right: L3-4 & L5-S1 & Left: L3-4); Lumbar radiculopathy (Bilateral); Weakness of proximal end of lower extremity (Bilateral); Chronic upper extremity pain (Primary Area of Pain) (Bilateral) (L>R); 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 His primarily concern today is the Back Pain (low); Foot Pain (left); and Knee Pain (left)  Pain Assessment: Location: Lower Back Radiating: radiates into left hip and down to foot  in the front and back Onset: More than a month ago Duration: Chronic pain Quality: Aching, Constant, Radiating Severity: 2 /10 (self-reported pain score)  Note: Reported level is compatible with observation.                   Timing: Constant Modifying factors: pain meds  Howard Maxwell was last seen on 71/01/2017 for a procedure. During today's appointment we reviewed Howard Maxwell's post-procedure results, as well as his outpatient medication regimen.  Further details on both, my assessment(s), as well as the proposed treatment plan, please see below.  Controlled Substance Pharmacotherapy Assessment REMS (Risk Evaluation and Mitigation Strategy)  Analgesic: Oxycodone IR 5 mg every 6 hours (20 mg/day of oxycodone) (30 MME/day) MME/day: 30 mg/day.  Howard Shorter, Howard Maxwell  12/09/2016 10:48 AM  Sign at close encounter Nursing Pain Medication Assessment:  Safety precautions to be maintained throughout the outpatient stay will include: orient to surroundings, keep bed in low position, maintain call bell within reach at all times, provide assistance with transfer out of bed and  ambulation.  Medication Inspection Compliance: Pill count conducted under aseptic conditions, in front of the patient. Neither the pills nor  the bottle was removed from the patient's sight at any time. Once count was completed pills were immediately returned to the patient in their original bottle.  Medication: Oxycodone IR Pill/Patch Count: 7 of 120 pills remain Pill/Patch Appearance: Markings consistent with prescribed medication Bottle Appearance: Standard pharmacy container. Clearly labeled. Filled [id number] / 23/ 2018 Last Medication intake:  Today   Pharmacokinetics: Liberation and absorption (onset of action): WNL Distribution (time to peak effect): WNL Metabolism and excretion (duration of action): WNL         Pharmacodynamics: Desired effects: Analgesia: Howard Maxwell reports >50% benefit. Functional ability: Patient reports that medication allows him to accomplish basic ADLs Clinically meaningful improvement in function (CMIF): Sustained CMIF goals met Perceived effectiveness: Described as relatively effective, allowing for increase in activities of daily living (ADL) Undesirable effects: Side-effects or Adverse reactions: None reported Monitoring: [place] PMP: Online review of the past 17-monthperiod conducted. Compliant with practice rules and regulations List of all UDS test(s) done:  Lab Results  Component Value Date   SUMMARY FINAL 08/06/2016   Last UDS on record: Summary  Date Value Ref Range Status  08/06/2016 FINAL  Final    Comment:    ==================================================================== TOXASSURE COMP DRUG ANALYSIS,UR ==================================================================== Test                             Result       Flag       Units Drug Present and Declared for Prescription Verification   Hydrocodone                    252          EXPECTED   ng/mg creat   Norhydrocodone                 345          EXPECTED   ng/mg creat    Sources  of hydrocodone include scheduled prescription    medications. Norhydrocodone is an expected metabolite of    hydrocodone.   Methocarbamol                  PRESENT      EXPECTED Drug Absent but Declared for Prescription Verification   Acetaminophen                  Not Detected UNEXPECTED    Acetaminophen, as indicated in the declared medication list, is    not always detected even when used as directed.   Ibuprofen                      Not Detected UNEXPECTED    Ibuprofen, as indicated in the declared medication list, is not    always detected even when used as directed. ==================================================================== Test                      Result    Flag   Units      Ref Range   Creatinine              60               mg/dL      >=20 ==================================================================== Declared Medications:  The flagging and interpretation on this report are based on the  following declared medications.  Unexpected results may arise from  inaccuracies in the declared medications.  **Note: The testing scope  of this panel includes these medications:  Hydrocodone (Hydrocodone-Acetaminophen)  Methocarbamol  **Note: The testing scope of this panel does not include small to  moderate amounts of these reported medications:  Acetaminophen (Hydrocodone-Acetaminophen)  Ibuprofen  **Note: The testing scope of this panel does not include following  reported medications:  Atorvastatin  Azilsartan  Carvedilol  Chlorthalidone  Cholecalciferol  Eye Drops  Glipizide  Magnesium  Metformin  Pantoprazole  Potassium  Spironolactone ==================================================================== For clinical consultation, please call [phone]. ====================================================================    UDS interpretation: Compliant          Medication Assessment Form: Reviewed. Patient indicates being compliant with  therapy Treatment compliance: Compliant Risk Assessment Profile: Aberrant behavior: See prior evaluations. None observed or detected today Comorbid factors increasing risk of overdose: See prior notes. No additional risks detected today Risk of substance use disorder (SUD): Low     Opioid Risk Tool - 12/01/16 0816      Family History of Substance Abuse   Alcohol Negative   Illegal Drugs Negative   Rx Drugs Negative     Personal History of Substance Abuse   Alcohol Negative   Illegal Drugs Negative   Rx Drugs Negative     Age   Age between 38-45 years  No     History of Preadolescent Sexual Abuse   History of Preadolescent Sexual Abuse Negative or Male     Psychological Disease   Psychological Disease Negative   Depression Negative     Total Score   Opioid Risk Tool Scoring 0   Opioid Risk Interpretation Low Risk     ORT Scoring interpretation table:  Score <3 = Low Risk for SUD  Score between 4-7 = Moderate Risk for SUD  Score >8 = High Risk for Opioid Abuse   Risk Mitigation Strategies:  Patient Counseling: Covered Patient-Prescriber Agreement (PPA): Present and active  Notification to other healthcare providers: Done  Pharmacologic Plan: No change in therapy, at this time  Post-Procedure Assessment  12/01/2016 Procedure: Diagnostic bilateral L2-3 TFESI + Right L1-2 TFESI under fluoroscopic guidance and IV sedation  Pre-procedure pain score:  2/10 Post-procedure pain score: 0/10 (100% relief) Influential Factors: BMI: 35.30 kg/m Intra-procedural challenges: None observed.         Assessment challenges: None detected.              Reported side-effects: None.        Post-procedural adverse reactions or complications: None reported         Sedation: Sedation provided. When no sedatives are used, the analgesic levels obtained are directly associated to the effectiveness of the local anesthetics. However, when sedation is provided, the level of analgesia  obtained during the initial 1 hour following the intervention, is believed to be the result of a combination of factors. These factors may include, but are not limited to: 1. The effectiveness of the local anesthetics used. 2. The effects of the analgesic(s) and/or anxiolytic(s) used. 3. The degree of discomfort experienced by the patient at the time of the procedure. 4. The patients ability and reliability in recalling and recording the events. 5. The presence and influence of possible secondary gains and/or psychosocial factors. Reported result: Relief experienced during the 1st hour after the procedure: 100 % (Ultra-Short Term Relief)            Interpretative annotation: Clinically appropriate result. Analgesia during this period is likely to be Local Anesthetic and/or IV Sedative (Analgesic/Anxiolytic) related.  Effects of local anesthetic: The analgesic effects attained during this period are directly associated to the localized infiltration of local anesthetics and therefore cary significant diagnostic value as to the etiological location, or anatomical origin, of the pain. Expected duration of relief is directly dependent on the pharmacodynamics of the local anesthetic used. Long-acting (4-6 hours) anesthetics used.  Reported result: Relief during the next 4 to 6 hour after the procedure: 90 % (Short-Term Relief)            Interpretative annotation: Clinically appropriate result. Analgesia during this period is likely to be Local Anesthetic-related.          Long-term benefit: Defined as the period of time past the expected duration of local anesthetics (1 hour for short-acting and 4-6 hours for long-acting). With the possible exception of prolonged sympathetic blockade from the local anesthetics, benefits during this period are typically attributed to, or associated with, other factors such as analgesic sensory neuropraxia, antiinflammatory effects, or beneficial biochemical changes  provided by agents other than the local anesthetics.  Reported result: Extended relief following procedure: 90 % (lasting 48 hours) (Long-Term Relief)            Interpretative annotation: Clinically appropriate result. Good relief. No permanent benefit expected. Etiology is likely mechanical rather than inflammatory.          Current benefits: Defined as persistent relief that continues at this point in time.   Reported results: Treated area: 0 %       Interpretative annotation: Recurrence of symptoms. No permanent benefit expected. Effective diagnostic intervention.          Interpretation: Results would suggest a successful diagnostic intervention.                  Plan:  Based on the results, we have recommended that the patient follow up with his neurosurgeon for possible decompression of his severe spinal stenosis.  Laboratory Chemistry  Inflammation Markers (CRP: Acute Phase) (ESR: Chronic Phase) Lab Results  Component Value Date   CRP <0.8 08/06/2016   ESRSEDRATE 10 08/06/2016                 Renal Function Markers Lab Results  Component Value Date   BUN 35 (H) 09/27/2016   CREATININE 0.94 09/27/2016   GFRAA >60 09/27/2016   GFRNONAA >60 09/27/2016                 Hepatic Function Markers Lab Results  Component Value Date   AST 28 08/06/2016   ALT 30 08/06/2016   ALBUMIN 4.2 08/06/2016   ALKPHOS 71 08/06/2016                 Electrolytes Lab Results  Component Value Date   NA 137 09/27/2016   K 4.6 09/27/2016   CL 100 (L) 09/27/2016   CALCIUM 9.3 09/27/2016   MG 1.7 08/06/2016                 Neuropathy Markers Lab Results  Component Value Date   VITAMINB12 209 08/06/2016                 Bone Pathology Markers Lab Results  Component Value Date   ALKPHOS 71 08/06/2016   25OHVITD1 29 (L) 08/06/2016   25OHVITD2 <1.0 08/06/2016   25OHVITD3 29 08/06/2016   CALCIUM 9.3 09/27/2016                 Coagulation Parameters Lab Results  Component  Value  Date   INR 1.0 01/12/2014   LABPROT 13.3 01/12/2014   APTT 33.9 01/12/2014   PLT 293 09/27/2016                 Cardiovascular Markers Lab Results  Component Value Date   BNP 319 (H) 01/12/2014   HGB 11.9 (L) 09/27/2016   HCT 35.7 (L) 09/27/2016                 Note: Lab results reviewed.  Recent Diagnostic Imaging Review  Dg C-arm 1-60 Min-no Report  Result Date: 12/01/2016 Fluoroscopy was utilized by the requesting physician.  No radiographic interpretation.   Note: Imaging results reviewed.          Meds   Current Outpatient Prescriptions:  .  atorvastatin (LIPITOR) 40 MG tablet, Take 1 tablet (40 mg total) by mouth daily., Disp: 90 tablet, Rfl: 3 .  Azilsartan-Chlorthalidone (EDARBYCLOR) 40-25 MG TABS, Take 1 tablet by mouth daily., Disp: 90 tablet, Rfl: 3 .  carvedilol (COREG) 6.25 MG tablet, Take 1 tablet (6.25 mg total) by mouth 2 (two) times daily., Disp: 180 tablet, Rfl: 3 .  Cholecalciferol (VITAMIN D3) 1000 units CAPS, Take 1 capsule by mouth daily., Disp: , Rfl:  .  Coenzyme Q10 (COQ10) 200 MG CAPS, Take 1 capsule by mouth daily., Disp: , Rfl:  .  famotidine (PEPCID) 20 MG tablet, Take 1 tablet (20 mg total) by mouth 2 (two) times daily., Disp: 60 tablet, Rfl: 0 .  Ferrous Sulfate (SLOW FE) 142 (45 Fe) MG TBCR, Take 1 tablet by mouth daily., Disp: , Rfl:  .  glipiZIDE (GLUCOTROL) 5 MG tablet, Take 5 mg by mouth daily before breakfast. , Disp: , Rfl:  .  glucose blood (ONE TOUCH ULTRA TEST) test strip, USE AS DIRECTED TWICE A DAY, Disp: , Rfl:  .  Magnesium Oxide 500 MG CAPS, Take 1 capsule (500 mg total) by mouth 2 (two) times daily at 8 am and 10 pm., Disp: 180 capsule, Rfl: 0 .  metFORMIN (GLUCOPHAGE) 850 MG tablet, Take 850 mg by mouth 2 (two) times daily with a meal., Disp: , Rfl:  .  Misc Natural Products (TART CHERRY ADVANCED PO), Take by mouth., Disp: , Rfl:  .  naphazoline (NAPHCON) 0.1 % ophthalmic solution, Place 1 drop into both eyes as needed. , Disp: ,  Rfl:  .  neomycin-polymyxin-dexameth (MAXITROL) 0.1 % OINT, 1 application., Disp: , Rfl:  .  [START ON 12/12/2016] oxyCODONE (OXY IR/ROXICODONE) 5 MG immediate release tablet, Take 1 tablet (5 mg total) by mouth every 6 (six) hours as needed for severe pain., Disp: 120 tablet, Rfl: 0 .  [START ON 01/11/2017] oxyCODONE (OXY IR/ROXICODONE) 5 MG immediate release tablet, Take 1 tablet (5 mg total) by mouth every 6 (six) hours as needed for severe pain., Disp: 120 tablet, Rfl: 0 .  [START ON 02/10/2017] oxyCODONE (OXY IR/ROXICODONE) 5 MG immediate release tablet, Take 1 tablet (5 mg total) by mouth every 6 (six) hours as needed for severe pain., Disp: 120 tablet, Rfl: 0 .  Polyethyl Glycol-Propyl Glycol (SYSTANE) 0.4-0.3 % SOLN, Apply to eye., Disp: , Rfl:  .  Potassium 95 MG TABS, Take 1 tablet by mouth daily., Disp: , Rfl:  .  tiZANidine (ZANAFLEX) 2 MG tablet, Take by mouth., Disp: , Rfl:  .  vitamin B-12 (CYANOCOBALAMIN) 1000 MCG tablet, Take 1,000 mcg by mouth daily., Disp: , Rfl:  .  vitamin C (ASCORBIC ACID) 500 MG tablet, Take  500 mg by mouth daily., Disp: , Rfl:  .  spironolactone (ALDACTONE) 25 MG tablet, Take 1 tablet (25 mg total) by mouth daily., Disp: 90 tablet, Rfl: 3  ROS  Constitutional: Denies any fever or chills Gastrointestinal: No reported hemesis, hematochezia, vomiting, or acute GI distress Musculoskeletal: Denies any acute onset joint swelling, redness, loss of ROM, or weakness Neurological: No reported episodes of acute onset apraxia, aphasia, dysarthria, agnosia, amnesia, paralysis, loss of coordination, or loss of consciousness  Allergies  Howard Maxwell is allergic to loratadine.  [place]  Drug: Howard Maxwell  reports that he does not use drugs. Alcohol:  reports that he does not drink alcohol. Tobacco:  reports that he quit smoking about 2 years ago. His smoking use included Cigarettes. He smoked 1.00 pack per day. He has never used smokeless tobacco. Medical:  has a past  medical history of Anemia; Blind; Chronic back pain; COPD (chronic obstructive pulmonary disease) ([place]); Coronary artery disease; Diabetes mellitus without complication ([place]); Discitis of lumbar region (L1-2) (08/06/2016); GERD (gastroesophageal reflux disease); Hypertension; Sinus problem; and Tobacco use. Surgical: Howard Maxwell  has a past surgical history that includes Foot surgery (Bilateral); Anterior cervical decomp/discectomy fusion; Gallbladder surgery; Femur fracture surgery; Eye surgery; Nasal reconstruction; Cardiac catheterization (12/07/2013); and Back surgery. Family: Family history is unknown by patient.  Constitutional Exam  General appearance: Well nourished, well developed, and well hydrated. In no apparent acute distress Vitals:   12/09/16 1029 12/09/16 1034  BP: 116/61   Pulse: 62   Resp: 18   Temp: 98 F (36.7 C)   SpO2: 98%   Weight:  246 lb (111.6 kg)  Height:  5' 10"  (1.778 m)   BMI Assessment: Estimated body mass index is 35.3 kg/m as calculated from the following:   Height as of this encounter: 5' 10"  (1.778 m).   Weight as of this encounter: 246 lb (111.6 kg).  BMI interpretation table: BMI level Category Range association with higher incidence of chronic pain  <18 kg/m2 Underweight   18.5-24.9 kg/m2 Ideal body weight   25-29.9 kg/m2 Overweight Increased incidence by 20%  30-34.9 kg/m2 Obese (Class I) Increased incidence by 68%  35-39.9 kg/m2 Severe obesity (Class II) Increased incidence by 136%  >40 kg/m2 Extreme obesity (Class III) Increased incidence by 254%   BMI Readings from Last 4 Encounters:  12/10/16 35.44 kg/m  12/09/16 35.30 kg/m  12/01/16 35.58 kg/m  11/11/16 35.87 kg/m   Wt Readings from Last 4 Encounters:  12/10/16 247 lb (112 kg)  12/09/16 246 lb (111.6 kg)  12/01/16 248 lb (112.5 kg)  11/11/16 250 lb (113.4 kg)  Psych/Mental status: Alert, oriented x 3 (person, place, & time)       Eyes: PERLA Respiratory: No evidence of acute  respiratory distress  Cervical Spine Area Exam  Skin & Axial Inspection: No masses, redness, edema, swelling, or associated skin lesions Alignment: Symmetrical Functional ROM: Unrestricted ROM      Stability: No instability detected Muscle Tone/Strength: Functionally intact. No obvious neuro-muscular anomalies detected. Sensory (Neurological): Unimpaired Palpation: No palpable anomalies              Upper Extremity (UE) Exam    Side: Right upper extremity  Side: Left upper extremity  Skin & Extremity Inspection: Skin color, temperature, and hair growth are WNL. No peripheral edema or cyanosis. No masses, redness, swelling, asymmetry, or associated skin lesions. No contractures.  Skin & Extremity Inspection: Skin color, temperature, and hair growth are WNL. No peripheral edema  or cyanosis. No masses, redness, swelling, asymmetry, or associated skin lesions. No contractures.  Functional ROM: Unrestricted ROM          Functional ROM: Unrestricted ROM          Muscle Tone/Strength: Functionally intact. No obvious neuro-muscular anomalies detected.  Muscle Tone/Strength: Functionally intact. No obvious neuro-muscular anomalies detected.  Sensory (Neurological): Unimpaired          Sensory (Neurological): Unimpaired          Palpation: No palpable anomalies              Palpation: No palpable anomalies              Specialized Test(s): Deferred         Specialized Test(s): Deferred          Thoracic Spine Area Exam  Skin & Axial Inspection: No masses, redness, or swelling Alignment: Symmetrical Functional ROM: Unrestricted ROM Stability: No instability detected Muscle Tone/Strength: Functionally intact. No obvious neuro-muscular anomalies detected. Sensory (Neurological): Unimpaired Muscle strength & Tone: No palpable anomalies  Lumbar Spine Area Exam  Skin & Axial Inspection: No masses, redness, or swelling Alignment: Symmetrical Functional ROM: Decreased ROM      Stability: No  instability detected Muscle Tone/Strength: Functionally intact. No obvious neuro-muscular anomalies detected. Sensory (Neurological): Movement-associated pain Palpation: Complains of area being tender to palpation       Provocative Tests: Lumbar Hyperextension and rotation test: Equivocal       Lumbar Lateral bending test: evaluation deferred today       Patrick's Maneuver: evaluation deferred today                    Gait & Posture Assessment  Ambulation: Patient ambulates using a cane Gait: Very limited, using assistive device to ambulate Posture: Antalgic   Lower Extremity Exam    Side: Right lower extremity  Side: Left lower extremity  Skin & Extremity Inspection: Skin color, temperature, and hair growth are WNL. No peripheral edema or cyanosis. No masses, redness, swelling, asymmetry, or associated skin lesions. No contractures.  Skin & Extremity Inspection: Skin color, temperature, and hair growth are WNL. No peripheral edema or cyanosis. No masses, redness, swelling, asymmetry, or associated skin lesions. No contractures.  Functional ROM: Unrestricted ROM          Functional ROM: Decreased ROM for knee joint  Muscle Tone/Strength: Functionally intact. No obvious neuro-muscular anomalies detected.  Muscle Tone/Strength: Functionally intact. No obvious neuro-muscular anomalies detected.  Sensory (Neurological): Unimpaired  Sensory (Neurological): Arthropathic arthralgia  Palpation: No palpable anomalies  Palpation: No palpable anomalies   Assessment  Primary Diagnosis & Pertinent Problem List: The primary encounter diagnosis was Chronic pain syndrome. 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  Status Diagnosis  Controlled Controlled Controlled 1. Chronic pain syndrome    2. 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 Chronic knee pain (Left)   4. 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 Long term prescription opiate use   6. Opiate use (30 MME/Day)     Problems updated and reviewed during this visit: Problem  Sensory Neuropathy  Opiate use (30 MME/Day)   Plan of Care  Pharmacotherapy (Medications Ordered): Meds ordered this encounter  Medications  . oxyCODONE (OXY IR/ROXICODONE) 5 MG immediate  release tablet    Sig: Take 1 tablet (5 mg total) by mouth every 6 (six) hours as needed for severe pain.    Dispense:  120 tablet    Refill:  0    Do not place this medication, or any other prescription from our practice, on "Automatic Refill". Patient may have prescription filled one day early if pharmacy is closed on scheduled refill date. Do not fill until: 12/12/16 To last until: 01/11/17  . oxyCODONE (OXY IR/ROXICODONE) 5 MG immediate release tablet    Sig: Take 1 tablet (5 mg total) by mouth every 6 (six) hours as needed for severe pain.    Dispense:  120 tablet    Refill:  0    Do not place this medication, or any other prescription from our practice, on "Automatic Refill". Patient may have prescription filled one day early if pharmacy is closed on scheduled refill date. Do not fill until: 01/11/17 To last until: 02/10/17  . oxyCODONE (OXY IR/ROXICODONE) 5 MG immediate release tablet    Sig: Take 1 tablet (5 mg total) by mouth every 6 (six) hours as needed for severe pain.    Dispense:  120 tablet    Refill:  0    Do not place this medication, or any other prescription from our practice, on "Automatic Refill". Patient may have prescription filled one day early if pharmacy is closed on scheduled refill date. Do not fill until: 02/10/17 To last until: 03/12/17   New Prescriptions   No medications on file   Medications administered today: Howard Maxwell had no medications administered during this  visit.   Procedure Orders     KNEE INJECTION Lab Orders  No laboratory test(s) ordered today   Imaging Orders  No imaging studies ordered today   Referral Orders  No referral(s) requested today    Interventional management options: Planned, scheduled, and/or pending:   Therapeutic Left intra-articular Hyalgan knee injection #1    Considering:   Diagnostic bilateral lumbar facetblock  Possible bilateral lumbar facet RFA Diagnostic L3-4 interspinous ligament block Possible L3-4 bilateral medial branch RFA Diagnostic right sided L2-3 and L3-4 lumbar epiduralsteroid injection  Diagnostic bilateral L1-2 &L4-5 transforaminalepidural steroid injection  Diagnostic bilateral L2-3 transforaminal epidural steroid injection Diagnostic caudal epidural steroid injection + diagnostic epidurogram Possible Racz procedure Diagnostic bilateral intra-articular hipjoint injection  Diagnostic bilateral femoral nerve and obturator nerveblock  Possible bilateral femoral nerve and obturator nerve RFA Diagnostic left cervical epidural steroid injection  Diagnostic bilateral cervical facet block Possible bilateral cervical facet RFA Possible series of 5 left intra-articular Hyalganknee injection  Diagnostic left Genicular nerve block Possible left Genicular nerve radiofrequencyablation  Possible intrathecal opioid trial   Palliative PRN treatment(s):   None at this time   Provider-requested follow-up: Return in about 3 months (around 02/24/2017) for Med-Mgmt by Dionisio David, NP. In addition, return tomorrow for an intra-articular knee injection on the left side using Hyalgan. Initially we thought about doing an injection with local anesthetic and steroid, but his diabetes is acting up and therefore we have decided to go directly to the Hyalgan.  Future Appointments Date Time Provider [place]  12/11/2016 10:40 AM Wellington Hampshire, MD [facility]  12/24/2016  7:45 AM Milinda Pointer, MD [facility] None  02/17/2017 8:30 AM Vevelyn Francois, NP [facility] None   Primary Care Physician: Tracie Harrier, MD Location: [facility] Outpatient Pain Management Facility Note by: Gaspar Cola, MD Date: 12/09/2016; Time: 11:45 AM

## 2016-12-09 NOTE — Patient Instructions (Addendum)
You have been given instructions for pre procedure without sedation.  You have been given 3 scripts for oxycodone.       Post-op Pain Management on a Chronic Pain Patient  Why should the surgeon manage his patient's post-op pain? The Surgeon is uniquely qualified to determine the amount of post-operative pain to be expected on a procedure or surgery that he/she has performed. Even with similar surgeries, the surgeon's perspective on expected pain is unique, since he/she performed the procedure and knows the degree of difficulty and/or tissue damage involved in each particular case. The surgeon is also up to date on events such as blood loss, intraoperative complications, and PO (per orum) status that may influence not only the patient's dose and schedule, but route of administration as well.  How about telling chronic pain patients to just double up or increase their usual pain medication intake to compensate for the increased pain? This is a bad idea since it will lead to the patient running out of his/her usual medications early and this may create a problem at the level of the insurance, which supplies medications based on the amount and schedule stated on the prescription. Running out early may trigger an event where the refill is denied by the insurance company and/or pharmacy. In addition, this practice provides a very poor paper trail as to why this patient ran out of medication early. In addition, from the perspective of the pain physician, it creates a nightmare in the accounting of the patient's medication.  So, what should I do as a Psychologist, sport and exercise when confronted with a patient that needs surgery and already takes a significant amount of pain medicine for their chronic pain, which may or may not be related to the surgery I have to perform?  This is what the surgeon should do: 1. Do not change the dose or schedule of the pain medications prescribed by the pain specialist. This medication  regimen allows for the patient's chronic pain to be under control, so as to bring that patient down to the level of an average individual. 2. Have the patient continue their usual pain management regimen, without any alterations. In addition, manage the post-operative pain as you would on any other "narcotic naive" patient. Do not attempt to compensate for tolerance. This is what the patient's usual regimen will do for you. Simply treat the patient as if they had no chronic pain and as if they were taking no other pain medications. 3. Talk to the patient about the medication, just like you would for anyone else. Do not assume that they are experts in opioids. Make sure you let the patient know that the medication is to be used only if absolutely necessary. (PRN) 4. Prescribe the medication for as long as you would on any other patient undergoing the same type of surgery. Prescribe for the same average amount of time that you would on any other patient. Avoid prescribing for longer periods. 5. Send [place] a copy of the operative report with information about your choice of the post-op pain medication provided. 6. Keep [place] informed of any complications that may prolong the average duration patient's post-op pain.  If you have any questions, please feel to contact us at [phone]. _____________________________________________________________________________________________  ____________________________________________________________________________________________  Preparing for your procedure (without sedation) Instructions: . Oral Intake: Do not eat or drink anything for at least 3 hours prior to your procedure. . Transportation: Unless otherwise stated by your physician, you may drive yourself  after the procedure. . Blood Pressure Medicine: Take your blood pressure medicine with a sip of water the morning of the procedure. . Blood thinners:  . Diabetics on insulin: Notify the staff so that you can  be scheduled 1st case in the morning. If your diabetes requires high dose insulin, take only  of your normal insulin dose the morning of the procedure and notify the staff that you have done so. . Preventing infections: Shower with an antibacterial soap the morning of your procedure.  . Build-up your immune system: Take 1000 mg of Vitamin C with every meal (3 times a day) the day prior to your procedure. Marland Kitchen Antibiotics: Inform the staff if you have a condition or reason that requires you to take antibiotics before dental procedures. . Pregnancy: If you are pregnant, call and cancel the procedure. . Sickness: If you have a cold, fever, or any active infections, call and cancel the procedure. . Arrival: You must be in the facility at least 30 minutes prior to your scheduled procedure. . Children: Do not bring any children with you. . Dress appropriately: Bring dark clothing that you would not mind if they get stained. . Valuables: Do not bring any jewelry or valuables. Procedure appointments are reserved for interventional treatments only. Marland Kitchen No Prescription Refills. . No medication changes will be discussed during procedure appointments. . No disability issues will be discussed. ____________________________________________________________________________________________

## 2016-12-09 NOTE — Progress Notes (Signed)
Nursing Pain Medication Assessment:  Safety precautions to be maintained throughout the outpatient stay will include: orient to surroundings, keep bed in low position, maintain call bell within reach at all times, provide assistance with transfer out of bed and ambulation.  Medication Inspection Compliance: Pill count conducted under aseptic conditions, in front of the patient. Neither the pills nor the bottle was removed from the patient's sight at any time. Once count was completed pills were immediately returned to the patient in their original bottle.  Medication: Oxycodone IR Pill/Patch Count: 7 of 120 pills remain Pill/Patch Appearance: Markings consistent with prescribed medication Bottle Appearance: Standard pharmacy container. Clearly labeled. Filled [id number] / 23/ 2018 Last Medication intake:  Today

## 2016-12-10 ENCOUNTER — Ambulatory Visit: Payer: Medicare Other | Attending: Pain Medicine | Admitting: Pain Medicine

## 2016-12-10 ENCOUNTER — Encounter: Payer: Self-pay | Admitting: Pain Medicine

## 2016-12-10 VITALS — BP 134/71 | HR 65 | Temp 98.5°F | Resp 16 | Ht 70.0 in | Wt 247.0 lb

## 2016-12-10 DIAGNOSIS — Z9889 Other specified postprocedural states: Secondary | ICD-10-CM | POA: Diagnosis not present

## 2016-12-10 DIAGNOSIS — M542 Cervicalgia: Secondary | ICD-10-CM | POA: Diagnosis not present

## 2016-12-10 DIAGNOSIS — M79672 Pain in left foot: Secondary | ICD-10-CM | POA: Diagnosis not present

## 2016-12-10 DIAGNOSIS — M25562 Pain in left knee: Secondary | ICD-10-CM | POA: Diagnosis not present

## 2016-12-10 DIAGNOSIS — G8929 Other chronic pain: Secondary | ICD-10-CM | POA: Insufficient documentation

## 2016-12-10 DIAGNOSIS — M549 Dorsalgia, unspecified: Secondary | ICD-10-CM | POA: Insufficient documentation

## 2016-12-10 DIAGNOSIS — M1712 Unilateral primary osteoarthritis, left knee: Secondary | ICD-10-CM | POA: Insufficient documentation

## 2016-12-10 MED ORDER — ROPIVACAINE HCL 2 MG/ML IJ SOLN
2.0000 mL | Freq: Once | INTRAMUSCULAR | Status: AC
  Administered 2016-12-10: 8 mL via INTRA_ARTICULAR
  Filled 2016-12-10: qty 10

## 2016-12-10 MED ORDER — SODIUM HYALURONATE (VISCOSUP) 20 MG/2ML IX SOSY
2.0000 mL | PREFILLED_SYRINGE | Freq: Once | INTRA_ARTICULAR | Status: AC
  Administered 2016-12-10: 08:00:00 via INTRA_ARTICULAR
  Filled 2016-12-10: qty 2

## 2016-12-10 MED ORDER — LIDOCAINE HCL (PF) 1 % IJ SOLN
5.0000 mL | Freq: Once | INTRAMUSCULAR | Status: AC
  Administered 2016-12-10: 5 mL
  Filled 2016-12-10: qty 5

## 2016-12-10 NOTE — Patient Instructions (Signed)
____________________________________________________________________________________________  Preparing for your procedure (without sedation) Instructions: . Oral Intake: Do not eat or drink anything for at least 3 hours prior to your procedure. . Transportation: Unless otherwise stated by your physician, you may drive yourself after the procedure. . Blood Pressure Medicine: Take your blood pressure medicine with a sip of water the morning of the procedure. . Blood thinners:  . Diabetics on insulin: Notify the staff so that you can be scheduled 1st case in the morning. If your diabetes requires high dose insulin, take only  of your normal insulin dose the morning of the procedure and notify the staff that you have done so. . Preventing infections: Shower with an antibacterial soap the morning of your procedure.  . Build-up your immune system: Take 1000 mg of Vitamin C with every meal (3 times a day) the day prior to your procedure. Marland Kitchen Antibiotics: Inform the staff if you have a condition or reason that requires you to take antibiotics before dental procedures. . Pregnancy: If you are pregnant, call and cancel the procedure. . Sickness: If you have a cold, fever, or any active infections, call and cancel the procedure. . Arrival: You must be in the facility at least 30 minutes prior to your scheduled procedure. . Children: Do not bring any children with you. . Dress appropriately: Bring dark clothing that you would not mind if they get stained. . Valuables: Do not bring any jewelry or valuables. Procedure appointments are reserved for interventional treatments only. Marland Kitchen No Prescription Refills. . No medication changes will be discussed during procedure appointments. . No disability issues will be discussed. ____________________________________________________________________________________________

## 2016-12-10 NOTE — Progress Notes (Signed)
Patient's Name: Howard Maxwell  MRN: [id number]  Referring Provider: Tracie Harrier, MD  DOB: Aug 08, 1945  PCP: Tracie Harrier, MD  DOS: 12/10/2016  Note by: Gaspar Cola, MD  Service setting: Ambulatory outpatient  Specialty: Interventional Pain Management  Patient type: Established  Location: [facility] (AMB) Pain Management Facility  Visit type: Interventional Procedure   Primary Reason for Visit: Interventional Pain Management Treatment. CC: Knee Pain (left); Back Pain; Neck Pain; and Foot Pain (left)  Procedure:  Anesthesia, Analgesia, Anxiolysis:  Type: Therapeutic Intra-Articular Hyalgan Knee Injection #1 Region: Lateral infrapatellar Knee Region Level: Knee Joint Laterality: Left knee  Type: Local Anesthesia Local Anesthetic: Lidocaine 1% Route: Infiltration ([place]/IM) IV Access: Declined Sedation: Declined  Indication(s): Analgesia          Indications: 1. Chronic knee pain (Left)   2. 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 (year old), male patient, seen today for interventional treatment. He  has a past surgical history that includes Foot surgery (Bilateral); Anterior cervical decomp/discectomy fusion; Gallbladder surgery; Femur fracture surgery; Eye surgery; Nasal reconstruction; Cardiac catheterization (12/07/2013); and Back surgery. Howard Maxwell has a current medication list which includes the following prescription(s): atorvastatin, azilsartan-chlorthalidone, carvedilol, vitamin d3, coq10, famotidine, ferrous sulfate, glipizide, glucose blood, magnesium oxide, metformin, misc natural products, naphazoline, neomycin-polymyxin-dexameth, oxycodone, oxycodone, oxycodone, polyethyl glycol-propyl glycol, potassium, tizanidine, vitamin b-12, vitamin c, and spironolactone. His primarily concern today is the Knee Pain (left); Back Pain; Neck Pain;  and Foot Pain (left)  Initial Vital Signs: There were no vitals taken for this visit. BMI: Estimated body mass index is 35.44 kg/m as calculated from the following:   Height as of this encounter: 5\' 10"  (1.778 m).   Weight as of this encounter: 247 lb (112 kg).  Risk Assessment: Allergies: Reviewed. He is allergic to loratadine.  Allergy Precautions: None required Coagulopathies: Reviewed. None identified.  Blood-thinner therapy: None at this time Active Infection(s): Reviewed. None identified. Howard Maxwell is afebrile  Site Confirmation: Howard Maxwell was asked to confirm the procedure and laterality before marking the site Procedure checklist: Completed Consent: Before the procedure and under the influence of no sedative(s), amnesic(s), or anxiolytics, the patient was informed of the treatment options, risks and possible complications. To fulfill our ethical and legal obligations, as recommended by the American Medical Association's Code of Ethics, I have informed the patient of my clinical impression; the nature and purpose of the treatment or procedure; the risks, benefits, and possible complications of the intervention; the alternatives, including doing nothing; the risk(s) and benefit(s) of the alternative treatment(s) or procedure(s); and the risk(s) and benefit(s) of doing nothing. The patient was provided information about the general risks and possible complications associated with the procedure. These may include, but are not limited to: failure to achieve desired goals, infection, bleeding, organ or nerve damage, allergic reactions, paralysis, and death. In addition, the patient was informed of those risks and complications associated to the procedure, such as failure to decrease pain; infection; bleeding; organ or nerve damage with subsequent damage to sensory, motor, and/or autonomic systems, resulting in permanent pain, numbness, and/or weakness of one or several areas of the body;  allergic reactions; (i.e.: anaphylactic reaction); and/or death. Furthermore, the patient was informed of those risks and complications associated with the medications. These include, but are not limited to: allergic reactions (i.e.: anaphylactic or anaphylactoid reaction(s)); adrenal axis suppression; blood  sugar elevation that in diabetics may result in ketoacidosis or comma; water retention that in patients with history of congestive heart failure may result in shortness of breath, pulmonary edema, and decompensation with resultant heart failure; weight gain; swelling or edema; medication-induced neural toxicity; particulate matter embolism and blood vessel occlusion with resultant organ, and/or nervous system infarction; and/or aseptic necrosis of one or more joints. Finally, the patient was informed that Medicine is not an exact science; therefore, there is also the possibility of unforeseen or unpredictable risks and/or possible complications that may result in a catastrophic outcome. The patient indicated having understood very clearly. We have given the patient no guarantees and we have made no promises. Enough time was given to the patient to ask questions, all of which were answered to the patient's satisfaction. Howard Maxwell has indicated that he wanted to continue with the procedure. Attestation: I, the ordering provider, attest that I have discussed with the patient the benefits, risks, side-effects, alternatives, likelihood of achieving goals, and potential problems during recovery for the procedure that I have provided informed consent. Date: 12/10/2016; Time: 6:59 AM  Pre-Procedure Preparation:  Monitoring: As per clinic protocol. Respiration, ETCO2, SpO2, BP, heart rate and rhythm monitor placed and checked for adequate function Safety Precautions: Patient was assessed for positional comfort and pressure points before starting the procedure. Time-out: I initiated and conducted the "Time-out"  before starting the procedure, as per protocol. The patient was asked to participate by confirming the accuracy of the "Time Out" information. Verification of the correct person, site, and procedure were performed and confirmed by me, the nursing staff, and the patient. "Time-out" conducted as per Joint Commission's Universal Protocol (UP.01.01.01). "Time-out" Date & Time: 12/10/2016; 0819 hrs.  Description of Procedure Process:   Position: Sitting Target Area: Knee Joint Approach: Just above the Lateral tibial plateau, lateral to the infrapatellar tendon. Area Prepped: Entire knee area, from the mid-thigh to the mid-shin. Prepping solution: ChloraPrep (2% chlorhexidine gluconate and 70% isopropyl alcohol) Safety Precautions: Aspiration looking for blood return was conducted prior to all injections. At no point did we inject any substances, as a needle was being advanced. No attempts were made at seeking any paresthesias. Safe injection practices and needle disposal techniques used. Medications properly checked for expiration dates. SDV (single dose vial) medications used. Description of the Procedure: Protocol guidelines were followed. The patient was placed in position over the fluoroscopy table. The target area was identified and the area prepped in the usual manner. Skin desensitized using vapocoolant spray. Skin & deeper tissues infiltrated with local anesthetic. Appropriate amount of time allowed to pass for local anesthetics to take effect. The procedure needles were then advanced to the target area. Proper needle placement secured. Negative aspiration confirmed. Solution injected in intermittent fashion, asking for systemic symptoms every 0.5cc of injectate. The needles were then removed and the area cleansed, making sure to leave some of the prepping solution back to take advantage of its long term bactericidal properties. Vitals:   12/10/16 0758 12/10/16 0825  BP: 132/68 134/71  Pulse: 63 65   Resp: 16 16  Temp: 98.5 F (36.9 C)   SpO2: 97% 98%  Weight: 247 lb (112 kg)   Height: 5\' 10"  (1.778 m)     Start Time: 0819 hrs. End Time: 0823 hrs. Materials:  Needle(s) Type: Regular needle Gauge: 22G Length: 3.5-in Medication(s): We administered Sodium Hyaluronate, ropivacaine (PF) 2 mg/mL (0.2%), and lidocaine (PF). Please see chart orders for dosing details.  Imaging  Guidance:  Type of Imaging Technique: None used Indication(s): N/A Exposure Time: No patient exposure Contrast: None used. Fluoroscopic Guidance: N/A Ultrasound Guidance: N/A Interpretation: N/A  Antibiotic Prophylaxis:  Indication(s): None identified Antibiotic given: None  Post-operative Assessment:  EBL: None Complications: No immediate post-treatment complications observed by team, or reported by patient. Note: The patient tolerated the entire procedure well. A repeat set of vitals were taken after the procedure and the patient was kept under observation following institutional policy, for this type of procedure. Post-procedural neurological assessment was performed, showing return to baseline, prior to discharge. The patient was provided with post-procedure discharge instructions, including a section on how to identify potential problems. Should any problems arise concerning this procedure, the patient was given instructions to immediately contact us, at any time, without hesitation. In any case, we plan to contact the patient by telephone for a follow-up status report regarding this interventional procedure. Comments:  No additional relevant information.  Plan of Care  Disposition: Discharge home  Discharge Date & Time: 12/10/2016; 0824 hrs.   Physician-requested Follow-up:  Return in about 2 weeks (around 12/24/2016) for Procedure (no sedation): (L) IA Knee Hyalgan #2.  Future Appointments Date Time Provider [place]  12/11/2016 10:40 AM Wellington Hampshire, MD [facility]   12/24/2016 7:45 AM Milinda Pointer, MD [facility] None  02/17/2017 8:30 AM Vevelyn Francois, NP [facility] None   Imaging Orders  No imaging studies ordered today    Procedure Orders     KNEE INJECTION     KNEE INJECTION  Medications ordered for procedure: Meds ordered this encounter  Medications  . Sodium Hyaluronate SOSY 2 mL  . ropivacaine (PF) 2 mg/mL (0.2%) (NAROPIN) injection 2 mL  . lidocaine (PF) (XYLOCAINE) 1 % injection 5 mL   Medications administered: We administered Sodium Hyaluronate, ropivacaine (PF) 2 mg/mL (0.2%), and lidocaine (PF).  See the medical record for exact dosing, route, and time of administration.  New Prescriptions   No medications on file   Primary Care Physician: Tracie Harrier, MD Location: [facility] Outpatient Pain Management Facility Note by: Gaspar Cola, MD Date: 12/10/2016; Time: 8:44 AM  Disclaimer:  Medicine is not an Chief Strategy Officer. The only guarantee in medicine is that nothing is guaranteed. It is important to note that the decision to proceed with this intervention was based on the information collected from the patient. The Data and conclusions were drawn from the patient's questionnaire, the interview, and the physical examination. Because the information was provided in large part by the patient, it cannot be guaranteed that it has not been purposely or unconsciously manipulated. Every effort has been made to obtain as much relevant data as possible for this evaluation. It is important to note that the conclusions that lead to this procedure are derived in large part from the available data. Always take into account that the treatment will also be dependent on availability of resources and existing treatment guidelines, considered by other Pain Management Practitioners as being common knowledge and practice, at the time of the intervention. For Medico-Legal purposes, it is also important to point out that variation in procedural  techniques and pharmacological choices are the acceptable norm. The indications, contraindications, technique, and results of the above procedure should only be interpreted and judged by a Board-Certified Interventional Pain Specialist with extensive familiarity and expertise in the same exact procedure and technique.

## 2016-12-10 NOTE — Progress Notes (Signed)
Safety precautions to be maintained throughout the outpatient stay will include: orient to surroundings, keep bed in low position, maintain call bell within reach at all times, provide assistance with transfer out of bed and ambulation.  

## 2016-12-11 ENCOUNTER — Encounter: Payer: Self-pay | Admitting: Cardiovascular Disease

## 2016-12-11 ENCOUNTER — Ambulatory Visit (INDEPENDENT_AMBULATORY_CARE_PROVIDER_SITE_OTHER): Payer: Medicare Other | Admitting: Cardiovascular Disease

## 2016-12-11 ENCOUNTER — Telehealth: Payer: Self-pay

## 2016-12-11 VITALS — BP 102/60 | HR 57 | Ht 70.0 in | Wt 252.0 lb

## 2016-12-11 DIAGNOSIS — I471 Supraventricular tachycardia: Secondary | ICD-10-CM

## 2016-12-11 DIAGNOSIS — I2510 Atherosclerotic heart disease of native coronary artery without angina pectoris: Secondary | ICD-10-CM

## 2016-12-11 DIAGNOSIS — I10 Essential (primary) hypertension: Secondary | ICD-10-CM

## 2016-12-11 DIAGNOSIS — E7800 Pure hypercholesterolemia, unspecified: Secondary | ICD-10-CM | POA: Diagnosis not present

## 2016-12-11 MED ORDER — LOSARTAN POTASSIUM-HCTZ 100-12.5 MG PO TABS: 1.0000 | ORAL_TABLET | Freq: Every day | ORAL | 3 refills | Status: DC

## 2016-12-11 NOTE — Progress Notes (Signed)
Cardiology Office Note   Date:  12/11/2016   ID:  Howard Maxwell, DOB [birth year], MRN [id number]  PCP:  Tracie Harrier, MD  Cardiologist:   Kathlyn Sacramento, MD   Chief Complaint  Patient presents with  . other    6 month follow up. Meds reviewed by the pt. verbally. "doing well."       History of Present Illness: Howard Maxwell is a 71 y.o. male who presents for a followup visit regarding coronary artery disease, hypertension and supraventricular tachycardia.  He has known history of type 2 diabetes diagnosed in 2010, hypertension, tobacco use and obesity. He also has family history of coronary artery disease.  He presented in September 2015 with unstable angina.  Cardiac catheterization showed  an occluded mid RCA which was medium in size and codominant. Normal ejection fraction. He was treated medically.  He quit smoking since then. During cardiac rehabilitation, he had an episode of tachycardia likely SVT.   Since last visit, he was diagnosed with severe iron deficiency anemia with a hemoglobin of 8.7. This gradually improved with iron treatment and is supposed to get an EGD and colonoscopy in the near future. He also continues to suffer with lower and cervical back pain. He reports the need to have surgery done. He stopped taking all NSAIDs and currently on oxycodone. He denies any chest pain, shortness of breath or palpitations.  Past Medical History:  Diagnosis Date  . Anemia   . Blind    one eye left  . Chronic back pain   . COPD (chronic obstructive pulmonary disease) ([place])   . Coronary artery disease    Cardiac catheterization in September of 2015 showed an occluded mid RCA which was medium in size and codominant. Normal ejection fraction.  . Diabetes mellitus without complication ([place])   . Discitis of lumbar region (L1-2) 08/06/2016  . GERD (gastroesophageal reflux disease)   . Hypertension   . Sinus problem   . Tobacco use     Past Surgical History:    Procedure Laterality Date  . ANTERIOR CERVICAL DECOMP/DISCECTOMY FUSION    . BACK SURGERY    . CARDIAC CATHETERIZATION  12/07/2013   [facility]  . EYE SURGERY    . FEMUR FRACTURE SURGERY    . FOOT SURGERY Bilateral   . GALLBLADDER SURGERY    . NASAL RECONSTRUCTION       Current Outpatient Prescriptions  Medication Sig Dispense Refill  . atorvastatin (LIPITOR) 40 MG tablet Take 1 tablet (40 mg total) by mouth daily. 90 tablet 3  . carvedilol (COREG) 6.25 MG tablet Take 1 tablet (6.25 mg total) by mouth 2 (two) times daily. 180 tablet 3  . Cholecalciferol (VITAMIN D3) 1000 units CAPS Take 1 capsule by mouth daily.    . Coenzyme Q10 (COQ10) 200 MG CAPS Take 1 capsule by mouth daily.    . famotidine (PEPCID) 20 MG tablet Take 1 tablet (20 mg total) by mouth 2 (two) times daily. 60 tablet 0  . Ferrous Sulfate (SLOW FE) 142 (45 Fe) MG TBCR Take 1 tablet by mouth daily.    Marland Kitchen glipiZIDE (GLUCOTROL) 5 MG tablet Take 5 mg by mouth daily before breakfast.     . glucose blood (ONE TOUCH ULTRA TEST) test strip USE AS DIRECTED TWICE A DAY    . Magnesium Oxide 500 MG CAPS Take 1 capsule (500 mg total) by mouth 2 (two) times daily at 8 am and 10 pm. 180 capsule 0  .  metFORMIN (GLUCOPHAGE) 850 MG tablet Take 850 mg by mouth 2 (two) times daily with a meal.    . Misc Natural Products (TART CHERRY ADVANCED PO) Take by mouth.    . naphazoline (NAPHCON) 0.1 % ophthalmic solution Place 1 drop into both eyes as needed.     . neomycin-polymyxin-dexameth (MAXITROL) 0.1 % OINT 1 application.    Derrill Memo ON 12/12/2016] oxyCODONE (OXY IR/ROXICODONE) 5 MG immediate release tablet Take 1 tablet (5 mg total) by mouth every 6 (six) hours as needed for severe pain. 120 tablet 0  . Polyethyl Glycol-Propyl Glycol (SYSTANE) 0.4-0.3 % SOLN Apply to eye.    Marland Kitchen Potassium 95 MG TABS Take 1 tablet by mouth daily.    Marland Kitchen spironolactone (ALDACTONE) 25 MG tablet Take 1 tablet (25 mg total) by mouth daily. 90 tablet 3  . tiZANidine  (ZANAFLEX) 2 MG tablet Take by mouth.    . vitamin B-12 (CYANOCOBALAMIN) 1000 MCG tablet Take 1,000 mcg by mouth daily.    . vitamin C (ASCORBIC ACID) 500 MG tablet Take 500 mg by mouth daily.    Marland Kitchen losartan-hydrochlorothiazide (HYZAAR) 100-12.5 MG tablet Take 1 tablet by mouth daily. 90 tablet 3   No current facility-administered medications for this visit.     Allergies:   Loratadine    Social History:  The patient  reports that he quit smoking about 2 years ago. His smoking use included Cigarettes. He smoked 1.00 pack per day. He has never used smokeless tobacco. He reports that he does not drink alcohol or use drugs.   Family History:  The patient's Family history is unknown by patient.    ROS:  Please see the history of present illness.   Otherwise, review of systems are positive for none.   All other systems are reviewed and negative.    PHYSICAL EXAM: VS:  BP 102/60 (BP Location: Right Arm, Patient Position: Sitting, Cuff Size: Large)   Pulse (!) 57   Ht 5\' 10"  (1.778 m)   Wt 252 lb (114.3 kg)   BMI 36.16 kg/m  , BMI Body mass index is 36.16 kg/m. GEN: Well nourished, well developed, in no acute distress  HEENT: normal  Neck: no JVD, carotid bruits, or masses Cardiac: RRR; no murmurs, rubs, or gallops,no edema  Respiratory:  clear to auscultation bilaterally, normal work of breathing GI: soft, nontender, nondistended, + BS MS: no deformity or atrophy  Skin: warm and dry, no rash Neuro:  Strength and sensation are intact Psych: euthymic mood, full affect   EKG:  EKG is ordered today. The ekg ordered today demonstrates sinus bradycardia with first-degree AV block. Old inferior infarct.   Recent Labs: 08/06/2016: ALT 30; Magnesium 1.7 09/27/2016: BUN 35; Creatinine, Ser 0.94; Hemoglobin 11.9; Platelets 293; Potassium 4.6; Sodium 137    Lipid Panel    Component Value Date/Time   CHOL 88 (L) 01/16/2014 0805   TRIG 85 01/16/2014 0805   HDL 30 (L) 01/16/2014 0805    CHOLHDL 2.9 01/16/2014 0805   LDLCALC 41 01/16/2014 0805      Wt Readings from Last 3 Encounters:  12/11/16 252 lb (114.3 kg)  12/10/16 247 lb (112 kg)  12/09/16 246 lb (111.6 kg)       ASSESSMENT AND PLAN:  1.  Coronary artery disease involving native coronary arteries without angina: He is overall doing well. Continue medical therapy. Continue low-dose aspirin.  2. Paroxysmal supraventricular tachycardia:  No recurrent arrhythmia.  3. Essential hypertension:  Blood pressure is  running low and he is having hard time affording Edarbyclor. I elected to switch him to losartan-hydrochlorthiazide 11/12.5 mg once daily.  4. Hyperlipidemia: Continue treatment with atorvastatin 40 mg daily. His LDL has been consistently below 70.  Disposition:   FU with me in 6 months  Signed,  Kathlyn Sacramento, MD  12/11/2016 10:58 AM    [place]

## 2016-12-11 NOTE — Telephone Encounter (Signed)
Post procedure phone call.  Left message.  

## 2016-12-11 NOTE — Patient Instructions (Signed)
Medication Instructions:  Your physician has recommended you make the following change in your medication:  STOP taking edarbyclor START taking losartan-HCTZ 100mg /12.5mg  once daily  Labwork: none  Testing/Procedures: none  Follow-Up: Your physician wants you to follow-up in: 6 months with Dr. Fletcher Anon.  You will receive a reminder letter in the mail two months in advance. If you don't receive a letter, please call our office to schedule the follow-up appointment.  Any Other Special Instructions Will Be Listed Below (If Applicable).     If you need a refill on your cardiac medications before your next appointment, please call your pharmacy.

## 2016-12-15 ENCOUNTER — Telehealth: Payer: Self-pay | Admitting: Cardiovascular Disease

## 2016-12-15 NOTE — Telephone Encounter (Signed)
Faxed clearance for 9/28 colonoscopy and upper endoscopy to [facility], [phone]

## 2016-12-15 NOTE — Telephone Encounter (Signed)
Clearance request from [facility] for 9/28 colonoscopy and EGD in MD basket

## 2016-12-17 ENCOUNTER — Encounter: Payer: Self-pay | Admitting: *Deleted

## 2016-12-18 ENCOUNTER — Encounter
Admission: RE | Disposition: A | Discharge status: Home / Self Care | Payer: Self-pay | Source: Ambulatory Visit | Attending: Unknown Physician Specialty

## 2016-12-18 ENCOUNTER — Ambulatory Visit: Payer: Medicare Other | Admitting: Anesthesiology

## 2016-12-18 ENCOUNTER — Ambulatory Visit
Admission: RE | Admit: 2016-12-18 | Discharge: 2016-12-18 | Disposition: A | Discharge status: Home / Self Care | Payer: Medicare Other | Source: Ambulatory Visit | Attending: Unknown Physician Specialty | Admitting: Unknown Physician Specialty

## 2016-12-18 ENCOUNTER — Encounter: Payer: Self-pay | Admitting: *Deleted

## 2016-12-18 DIAGNOSIS — Z981 Arthrodesis status: Secondary | ICD-10-CM | POA: Insufficient documentation

## 2016-12-18 DIAGNOSIS — K640 First degree hemorrhoids: Secondary | ICD-10-CM | POA: Insufficient documentation

## 2016-12-18 DIAGNOSIS — Z888 Allergy status to other drugs, medicaments and biological substances status: Secondary | ICD-10-CM | POA: Insufficient documentation

## 2016-12-18 DIAGNOSIS — K2270 Barrett's esophagus without dysplasia: Secondary | ICD-10-CM | POA: Diagnosis not present

## 2016-12-18 DIAGNOSIS — Z79899 Other long term (current) drug therapy: Secondary | ICD-10-CM | POA: Diagnosis not present

## 2016-12-18 DIAGNOSIS — K2950 Unspecified chronic gastritis without bleeding: Secondary | ICD-10-CM | POA: Diagnosis not present

## 2016-12-18 DIAGNOSIS — K5730 Diverticulosis of large intestine without perforation or abscess without bleeding: Secondary | ICD-10-CM | POA: Insufficient documentation

## 2016-12-18 DIAGNOSIS — E669 Obesity, unspecified: Secondary | ICD-10-CM | POA: Diagnosis not present

## 2016-12-18 DIAGNOSIS — N529 Male erectile dysfunction, unspecified: Secondary | ICD-10-CM | POA: Diagnosis not present

## 2016-12-18 DIAGNOSIS — Z6835 Body mass index (BMI) 35.0-35.9, adult: Secondary | ICD-10-CM | POA: Diagnosis not present

## 2016-12-18 DIAGNOSIS — D509 Iron deficiency anemia, unspecified: Secondary | ICD-10-CM | POA: Insufficient documentation

## 2016-12-18 DIAGNOSIS — I2510 Atherosclerotic heart disease of native coronary artery without angina pectoris: Secondary | ICD-10-CM | POA: Insufficient documentation

## 2016-12-18 DIAGNOSIS — I252 Old myocardial infarction: Secondary | ICD-10-CM | POA: Insufficient documentation

## 2016-12-18 DIAGNOSIS — D123 Benign neoplasm of transverse colon: Secondary | ICD-10-CM | POA: Diagnosis not present

## 2016-12-18 DIAGNOSIS — I10 Essential (primary) hypertension: Secondary | ICD-10-CM | POA: Diagnosis not present

## 2016-12-18 DIAGNOSIS — Z7984 Long term (current) use of oral hypoglycemic drugs: Secondary | ICD-10-CM | POA: Insufficient documentation

## 2016-12-18 DIAGNOSIS — K3189 Other diseases of stomach and duodenum: Secondary | ICD-10-CM | POA: Insufficient documentation

## 2016-12-18 DIAGNOSIS — Z1211 Encounter for screening for malignant neoplasm of colon: Secondary | ICD-10-CM | POA: Insufficient documentation

## 2016-12-18 DIAGNOSIS — K210 Gastro-esophageal reflux disease with esophagitis: Secondary | ICD-10-CM | POA: Diagnosis not present

## 2016-12-18 DIAGNOSIS — G4730 Sleep apnea, unspecified: Secondary | ICD-10-CM | POA: Diagnosis not present

## 2016-12-18 DIAGNOSIS — Z86010 Personal history of colonic polyps: Secondary | ICD-10-CM | POA: Diagnosis not present

## 2016-12-18 DIAGNOSIS — E119 Type 2 diabetes mellitus without complications: Secondary | ICD-10-CM | POA: Diagnosis not present

## 2016-12-18 DIAGNOSIS — Z87891 Personal history of nicotine dependence: Secondary | ICD-10-CM | POA: Insufficient documentation

## 2016-12-18 DIAGNOSIS — H5462 Unqualified visual loss, left eye, normal vision right eye: Secondary | ICD-10-CM | POA: Insufficient documentation

## 2016-12-18 DIAGNOSIS — J449 Chronic obstructive pulmonary disease, unspecified: Secondary | ICD-10-CM | POA: Diagnosis not present

## 2016-12-18 HISTORY — DX: Cortical age-related cataract, unspecified eye: H25.019

## 2016-12-18 HISTORY — PX: ESOPHAGOGASTRODUODENOSCOPY: SHX5428

## 2016-12-18 HISTORY — DX: Sleep apnea, unspecified: G47.30

## 2016-12-18 HISTORY — DX: Male erectile dysfunction, unspecified: N52.9

## 2016-12-18 HISTORY — DX: Obesity, unspecified: E66.9

## 2016-12-18 HISTORY — DX: Acute myocardial infarction, unspecified: I21.9

## 2016-12-18 HISTORY — PX: COLONOSCOPY WITH PROPOFOL: SHX5780

## 2016-12-18 LAB — GLUCOSE, CAPILLARY: Glucose-Capillary: 156 mg/dL — ABNORMAL HIGH (ref 65–99)

## 2016-12-18 SURGERY — EGD (ESOPHAGOGASTRODUODENOSCOPY)
Anesthesia: General

## 2016-12-18 MED ORDER — FENTANYL CITRATE (PF) 100 MCG/2ML IJ SOLN
INTRAMUSCULAR | Status: DC | PRN
  Administered 2016-12-18 (×2): 50 ug via INTRAVENOUS

## 2016-12-18 MED ORDER — LIDOCAINE HCL (PF) 2 % IJ SOLN
INTRAMUSCULAR | Status: AC
  Filled 2016-12-18: qty 2

## 2016-12-18 MED ORDER — MIDAZOLAM HCL 5 MG/5ML IJ SOLN
INTRAMUSCULAR | Status: DC | PRN
  Administered 2016-12-18: 2 mg via INTRAVENOUS

## 2016-12-18 MED ORDER — LIDOCAINE HCL (PF) 2 % IJ SOLN
INTRAMUSCULAR | Status: DC | PRN
  Administered 2016-12-18: 80 mg

## 2016-12-18 MED ORDER — PROPOFOL 500 MG/50ML IV EMUL
INTRAVENOUS | Status: AC
  Filled 2016-12-18: qty 50

## 2016-12-18 MED ORDER — FENTANYL CITRATE (PF) 100 MCG/2ML IJ SOLN
INTRAMUSCULAR | Status: AC
  Filled 2016-12-18: qty 2

## 2016-12-18 MED ORDER — MIDAZOLAM HCL 2 MG/2ML IJ SOLN
INTRAMUSCULAR | Status: AC
  Filled 2016-12-18: qty 2

## 2016-12-18 MED ORDER — SODIUM CHLORIDE 0.9 % IV SOLN
INTRAVENOUS | Status: DC
  Administered 2016-12-18: 1000 mL via INTRAVENOUS

## 2016-12-18 MED ORDER — PROPOFOL 10 MG/ML IV BOLUS
INTRAVENOUS | Status: DC | PRN
  Administered 2016-12-18: 20 mg via INTRAVENOUS
  Administered 2016-12-18: 30 mg via INTRAVENOUS

## 2016-12-18 MED ORDER — PROPOFOL 500 MG/50ML IV EMUL
INTRAVENOUS | Status: DC | PRN
  Administered 2016-12-18: 75 ug/kg/min via INTRAVENOUS

## 2016-12-18 MED ORDER — IPRATROPIUM-ALBUTEROL 0.5-2.5 (3) MG/3ML IN SOLN
RESPIRATORY_TRACT | Status: AC
  Filled 2016-12-18: qty 3

## 2016-12-18 MED ORDER — SODIUM CHLORIDE 0.9 % IV SOLN: INTRAVENOUS | Status: DC

## 2016-12-18 MED ORDER — IPRATROPIUM-ALBUTEROL 0.5-2.5 (3) MG/3ML IN SOLN
3.0000 mL | Freq: Once | RESPIRATORY_TRACT | Status: AC
  Administered 2016-12-18: 3 mL via RESPIRATORY_TRACT

## 2016-12-18 MED ORDER — GLYCOPYRROLATE 0.2 MG/ML IJ SOLN
INTRAMUSCULAR | Status: AC
  Filled 2016-12-18: qty 1

## 2016-12-18 NOTE — Anesthesia Postprocedure Evaluation (Signed)
Anesthesia Post Note  Patient: [name]  Procedure(s) Performed: Procedure(s) (LRB): ESOPHAGOGASTRODUODENOSCOPY (EGD) (N/A) COLONOSCOPY WITH PROPOFOL (N/A)  Patient location during evaluation: Endoscopy Anesthesia Type: General Level of consciousness: awake and alert Pain management: pain level controlled Vital Signs Assessment: post-procedure vital signs reviewed and stable Respiratory status: spontaneous breathing, nonlabored ventilation, respiratory function stable and patient connected to nasal cannula oxygen Cardiovascular status: blood pressure returned to baseline and stable Postop Assessment: no apparent nausea or vomiting Anesthetic complications: no     Last Vitals:  Vitals:   12/18/16 0924 12/18/16 0934  BP: 127/70 (!) (P) 145/72  Pulse: (!) 55   Resp: 11   Temp:    SpO2: 96%     Last Pain:  Vitals:   12/18/16 0904  TempSrc: Tympanic  PainSc:                  Precious Haws Piscitello

## 2016-12-18 NOTE — Anesthesia Post-op Follow-up Note (Signed)
Anesthesia QCDR form completed.        
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
## 2016-12-18 NOTE — Op Note (Signed)
[facility] Gastroenterology Patient Name: Howard Maxwell Procedure Date: 12/18/2016 8:17 AM MRN: [id number] Account #: [id number] Date of Birth: 11-12-1945 Admit Type: Outpatient Age: 71 Room: [facility] ENDO ROOM 3 Gender: Male Note Status: Finalized Procedure:            Upper GI endoscopy Indications:          Unexplained iron deficiency anemia Providers:            Manya Silvas, MD Referring MD:         Tracie Harrier, MD (Referring MD) Medicines:            Propofol per Anesthesia Complications:        No immediate complications. Procedure:            Pre-Anesthesia Assessment:                       - After reviewing the risks and benefits, the patient                        was deemed in satisfactory condition to undergo the                        procedure.                       After obtaining informed consent, the endoscope was                        passed under direct vision. Throughout the procedure,                        the patient's blood pressure, pulse, and oxygen                        saturations were monitored continuously. The Endoscope                        was introduced through the mouth, and advanced to the                        second part of duodenum. The upper GI endoscopy was                        accomplished without difficulty. The patient tolerated                        the procedure well. Findings:      LA Grade A (one or more mucosal breaks less than 5 mm, not extending       between tops of 2 mucosal folds) esophagitis with no bleeding was found       42 cm from the incisors. Biopsies were taken with a cold forceps for       histology.      Diffuse mild inflammation characterized by erythema and granularity was       found in the gastric antrum. Biopsies were taken with a cold forceps for       histology. Biopsies were taken with a cold forceps for Helicobacter       pylori testing. Pin point flecks of old blood seen in  antrum In a few  areas.      Mild inflammation without ulceration or bleeding seen in duodenum. in       the bulb. Impression:           - LA Grade A reflux esophagitis. Rule out Barrett's                        esophagus. Biopsied.                       - Gastritis. Biopsied.                       - Erythematous duodenopathy. Recommendation:       - Await pathology results.                       - Perform a colonoscopy as previously scheduled. Manya Silvas, MD 12/18/2016 8:36:40 AM This report has been signed electronically. Number of Addenda: 0 Note Initiated On: 12/18/2016 8:17 AM      [facility]
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
## 2016-12-18 NOTE — Transfer of Care (Signed)
Immediate Anesthesia Transfer of Care Note  Patient: Howard Maxwell  Procedure(s) Performed: Procedure(s): ESOPHAGOGASTRODUODENOSCOPY (EGD) (N/A) COLONOSCOPY WITH PROPOFOL (N/A)  Patient Location: PACU  Anesthesia Type:General  Level of Consciousness: sedated  Airway & Oxygen Therapy: Patient Spontanous Breathing and Patient connected to nasal cannula oxygen  Post-op Assessment: Report given to RN and Post -op Vital signs reviewed and stable  Post vital signs: Reviewed and stable  Last Vitals:  Vitals:   12/18/16 0742 12/18/16 0904  BP: 125/75 111/67  Pulse: 63 (!) 56  Resp: 18 13  Temp: (!) 36.3 C (!) 36 C  SpO2: 96% 96%    Last Pain:  Vitals:   12/18/16 0904  TempSrc: Tympanic  PainSc:          Complications: No apparent anesthesia complications

## 2016-12-18 NOTE — Op Note (Signed)
[facility] Gastroenterology Patient Name: Klein Willcox Procedure Date: 12/18/2016 8:01 AM MRN: [id number] Account #: [id number] Date of Birth: 05-27-1945 Admit Type: Outpatient Age: 71 Room: [facility] ENDO ROOM 3 Gender: Male Note Status: Finalized Procedure:            Colonoscopy Indications:          High risk colon cancer surveillance: Personal history                        of colonic polyps Providers:            Manya Silvas, MD Medicines:            Propofol per Anesthesia Complications:        No immediate complications. Procedure:            Pre-Anesthesia Assessment:                       - After reviewing the risks and benefits, the patient                        was deemed in satisfactory condition to undergo the                        procedure.                       After obtaining informed consent, the colonoscope was                        passed under direct vision. Throughout the procedure,                        the patient's blood pressure, pulse, and oxygen                        saturations were monitored continuously. The                        Colonoscope was introduced through the anus and                        advanced to the the cecum, identified by appendiceal                        orifice and ileocecal valve. The colonoscopy was                        somewhat difficult. Successful completion of the                        procedure was aided by lavage. The patient tolerated                        the procedure well. The quality of the bowel                        preparation was adequate to identify polyps 6 mm and                        larger in size. Findings:      Three  sessile polyps were found in the descending colon and transverse       colon. The polyps were diminutive in size. These polyps were removed       with a jumbo cold forceps. Resection and retrieval were complete.      Many small-mouthed diverticula were found  in the sigmoid colon and       descending colon.      Internal hemorrhoids were found during endoscopy. The hemorrhoids were       small and Grade I (internal hemorrhoids that do not prolapse). Impression:           - Three diminutive polyps in the descending colon and                        in the transverse colon, removed with a jumbo cold                        forceps. Resected and retrieved.                       - Diverticulosis in the sigmoid colon and in the                        descending colon.                       - Internal hemorrhoids. Recommendation:       - Await pathology results. Manya Silvas, MD 12/18/2016 9:05:28 AM This report has been signed electronically. Number of Addenda: 0 Note Initiated On: 12/18/2016 8:01 AM Scope Withdrawal Time: 0 hours 20 minutes 49 seconds  Total Procedure Duration: 0 hours 24 minutes 11 seconds       [facility]

## 2016-12-21 LAB — SURGICAL PATHOLOGY

## 2016-12-23 DIAGNOSIS — Z79899 Other long term (current) drug therapy: Secondary | ICD-10-CM | POA: Insufficient documentation

## 2016-12-23 DIAGNOSIS — M899 Disorder of bone, unspecified: Secondary | ICD-10-CM | POA: Insufficient documentation

## 2016-12-23 DIAGNOSIS — Z789 Other specified health status: Secondary | ICD-10-CM | POA: Insufficient documentation

## 2016-12-23 NOTE — Progress Notes (Signed)
Patient's Name: Howard Maxwell  MRN: [id number]  Referring Provider: Tracie Harrier, MD  DOB: 1945/12/31  PCP: Tracie Harrier, MD  DOS: 12/24/2016  Note by: Gaspar Cola, MD  Service setting: Ambulatory outpatient  Specialty: Interventional Pain Management  Patient type: Established  Location: [facility] (AMB) Pain Management Facility  Visit type: Interventional Procedure   Primary Reason for Visit: Interventional Pain Management Treatment. CC: Knee Pain (left) and Back Pain  Procedure:  Anesthesia, Analgesia, Anxiolysis:  Type: Therapeutic Intra-Articular Hyalgan Knee Injection #2 Region: Lateral infrapatellar Knee Region Level: Knee Joint Laterality: Left knee  Type: Local Anesthesia Local Anesthetic: Lidocaine 1% Route: Infiltration (/IM) IV Access: Declined Sedation: Declined  Indication(s): Analgesia           Indications: 1. 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 Tricompartmental disease of knee (Left)   3. Chronic knee pain (Left)   4. Pharmacologic therapy   5. Chronic pain syndrome    Pain Score: Pre-procedure: 1 /10 Post-procedure: 0-No pain/10  Post-Procedure Assessment  12/10/2016 Procedure: Left intra-articular Hyalgan knee injection #1 Pre-procedure pain score:  2/10 Post-procedure pain score: 1/10 No initial benefit, possible due to rapid discharge after no sedation procedure, without enough time to allow full onset of block. Influential Factors: BMI: 35.01 kg/m Intra-procedural challenges: None observed.         Assessment challenges: None detected.              Reported side-effects: None.        Post-procedural adverse reactions or complications: None reported         Sedation: No sedation used. When no sedatives are used, the analgesic levels obtained are directly associated to the effectiveness of the local anesthetics. However, when sedation is provided, the level of analgesia obtained during the initial 1 hour  following the intervention, is believed to be the result of a combination of factors. These factors may include, but are not limited to: 1. The effectiveness of the local anesthetics used. 2. The effects of the analgesic(s) and/or anxiolytic(s) used. 3. The degree of discomfort experienced by the patient at the time of the procedure. 4. The patients ability and reliability in recalling and recording the events. 5. The presence and influence of possible secondary gains and/or psychosocial factors. Reported result: Relief experienced during the 1st hour after the procedure: 100 % (Ultra-Short Term Relief) Howard Maxwell has indicated area to have been numb during this time. Interpretative annotation: Clinically appropriate result. Analgesia during this period is likely to be Local Anesthetic and/or IV Sedative (Analgesic/Anxiolytic) related.          Effects of local anesthetic: The analgesic effects attained during this period are directly associated to the localized infiltration of local anesthetics and therefore cary significant diagnostic value as to the etiological location, or anatomical origin, of the pain. Expected duration of relief is directly dependent on the pharmacodynamics of the local anesthetic used. Long-acting (4-6 hours) anesthetics used.  Reported result: Relief during the next 4 to 6 hour after the procedure: 100 % (Short-Term Relief) Howard Maxwell has indicated area to have been numb during this time. Interpretative annotation: Clinically appropriate result. Analgesia during this period is likely to be Local Anesthetic-related.          Long-term benefit: Defined as the period of time past the expected duration of local anesthetics (1 hour for short-acting and 4-6 hours for long-acting). With the possible exception of prolonged sympathetic blockade from the local anesthetics, benefits  during this period are typically attributed to, or associated with, other factors such as analgesic  sensory neuropraxia, antiinflammatory effects, or beneficial biochemical changes provided by agents other than the local anesthetics.  Reported result: Extended relief following procedure: 90 % (ongoing) (Long-Term Relief)            Interpretative annotation: Clinically appropriate result. Good relief. No permanent benefit expected. Inflammation plays a part in the etiology to the pain.          Current benefits: Defined as reported results that persistent at this point in time.   Analgesia: 90 % [name] reports improvement of arthralgia. Function: Howard Maxwell reports improvement in function ROM: Howard Maxwell reports improvement in ROM Interpretative annotation: Recurrence of symptoms. No permanent benefit expected. Effective therapeutic approach.          Interpretation: Results would suggest a successful palliative intervention.                  Plan:  Proceed with treatment No.2  Pre-op Assessment:  Howard Maxwell is a 71 y.o. (year old), male patient, seen today for interventional treatment. He  has a past surgical history that includes Foot surgery (Bilateral); Anterior cervical decomp/discectomy fusion; Gallbladder surgery; Femur fracture surgery; Eye surgery; Nasal reconstruction; Cardiac catheterization (12/07/2013); Back surgery; Fracture surgery; Cholecystectomy; Esophagogastroduodenoscopy (N/A, 12/18/2016); and Colonoscopy with propofol (N/A, 12/18/2016). Howard Maxwell has a current medication list which includes the following prescription(s): atorvastatin, calcium, carvedilol, vitamin d3, coq10, famotidine, ferrous sulfate, fluocinonide gel, fluzone high-dose, glipizide, glucose blood, losartan-hydrochlorothiazide, magnesium oxide, metformin, misc natural products, naphazoline, neomycin-polymyxin-dexameth, oxycodone, polyethyl glycol-propyl glycol, potassium, prevnar 13, tizanidine, vitamin b-12, vitamin c, polyethylene glycol powder, polyethylene glycol-electrolytes, polyethylene  glycol-electrolytes, spironolactone, and spironolactone, and the following Facility-Administered Medications: sodium hyaluronate. His primarily concern today is the Knee Pain (left) and Back Pain  Initial Vital Signs: There were no vitals taken for this visit. BMI: Estimated body mass index is 35.01 kg/m as calculated from the following:   Height as of this encounter: 5\' 10"  (1.778 m).   Weight as of this encounter: 244 lb (110.7 kg).  Risk Assessment: Allergies: Reviewed. He is allergic to loratadine.  Allergy Precautions: None required Coagulopathies: Reviewed. None identified.  Blood-thinner therapy: None at this time Active Infection(s): Reviewed. None identified. Mr. Whitworth is afebrile  Site Confirmation: Mr. Hoge was asked to confirm the procedure and laterality before marking the site Procedure checklist: Completed Consent: Before the procedure and under the influence of no sedative(s), amnesic(s), or anxiolytics, the patient was informed of the treatment options, risks and possible complications. To fulfill our ethical and legal obligations, as recommended by the American Medical Association's Code of Ethics, I have informed the patient of my clinical impression; the nature and purpose of the treatment or procedure; the risks, benefits, and possible complications of the intervention; the alternatives, including doing nothing; the risk(s) and benefit(s) of the alternative treatment(s) or procedure(s); and the risk(s) and benefit(s) of doing nothing. The patient was provided information about the general risks and possible complications associated with the procedure. These may include, but are not limited to: failure to achieve desired goals, infection, bleeding, organ or nerve damage, allergic reactions, paralysis, and death. In addition, the patient was informed of those risks and complications associated to the procedure, such as failure to decrease pain; infection; bleeding; organ  or nerve damage with subsequent damage to sensory, motor, and/or autonomic systems, resulting in permanent pain, numbness, and/or weakness of one or several areas of  the body; allergic reactions; (i.e.: anaphylactic reaction); and/or death. Furthermore, the patient was informed of those risks and complications associated with the medications. These include, but are not limited to: allergic reactions (i.e.: anaphylactic or anaphylactoid reaction(s)); adrenal axis suppression; blood sugar elevation that in diabetics may result in ketoacidosis or comma; water retention that in patients with history of congestive heart failure may result in shortness of breath, pulmonary edema, and decompensation with resultant heart failure; weight gain; swelling or edema; medication-induced neural toxicity; particulate matter embolism and blood vessel occlusion with resultant organ, and/or nervous system infarction; and/or aseptic necrosis of one or more joints. Finally, the patient was informed that Medicine is not an exact science; therefore, there is also the possibility of unforeseen or unpredictable risks and/or possible complications that may result in a catastrophic outcome. The patient indicated having understood very clearly. We have given the patient no guarantees and we have made no promises. Enough time was given to the patient to ask questions, all of which were answered to the patient's satisfaction. Mr. Hausman has indicated that he wanted to continue with the procedure. Attestation: I, the ordering provider, attest that I have discussed with the patient the benefits, risks, side-effects, alternatives, likelihood of achieving goals, and potential problems during recovery for the procedure that I have provided informed consent. Date: 12/24/2016; Time: 7:24 AM  Pre-Procedure Preparation:  Monitoring: As per clinic protocol. Respiration, ETCO2, SpO2, BP, heart rate and rhythm monitor placed and checked for adequate  function Safety Precautions: Patient was assessed for positional comfort and pressure points before starting the procedure. Time-out: I initiated and conducted the "Time-out" before starting the procedure, as per protocol. The patient was asked to participate by confirming the accuracy of the "Time Out" information. Verification of the correct person, site, and procedure were performed and confirmed by me, the nursing staff, and the patient. "Time-out" conducted as per Joint Commission's Universal Protocol (UP.01.01.01). "Time-out" Date & Time: 12/24/2016; 0810 hrs.  Description of Procedure Process:   Position: Sitting Target Area: Knee Joint Approach: Just above the Lateral tibial plateau, lateral to the infrapatellar tendon. Area Prepped: Entire knee area, from the mid-thigh to the mid-shin. Prepping solution: ChloraPrep (2% chlorhexidine gluconate and 70% isopropyl alcohol) Safety Precautions: Aspiration looking for blood return was conducted prior to all injections. At no point did we inject any substances, as a needle was being advanced. No attempts were made at seeking any paresthesias. Safe injection practices and needle disposal techniques used. Medications properly checked for expiration dates. SDV (single dose vial) medications used. Description of the Procedure: Protocol guidelines were followed. The patient was placed in position over the fluoroscopy table. The target area was identified and the area prepped in the usual manner. Skin desensitized using vapocoolant spray. Skin & deeper tissues infiltrated with local anesthetic. Appropriate amount of time allowed to pass for local anesthetics to take effect. The procedure needles were then advanced to the target area. Proper needle placement secured. Negative aspiration confirmed. Solution injected in intermittent fashion, asking for systemic symptoms every 0.5cc of injectate. The needles were then removed and the area cleansed, making sure to  leave some of the prepping solution back to take advantage of its long term bactericidal properties. Vitals:   12/24/16 0752 12/24/16 0753 12/24/16 0809 12/24/16 0813  BP:  (!) 115/94 127/65 115/61  Pulse:  65 72 63  Resp:  16 16 18   Temp:  98.3 F (36.8 C)    SpO2:  97% 96% 95%  Weight: 244 lb (110.7 kg)     Height: 5\' 10"  (1.778 m)       Start Time: 0810 hrs. End Time: 0812 hrs. Materials:  Needle(s) Type: Regular needle Gauge: 25G Length: 1.5-in Medication(s): We administered lidocaine (PF), Sodium Hyaluronate, and ropivacaine (PF) 2 mg/mL (0.2%). Please see chart orders for dosing details.  Imaging Guidance:  Type of Imaging Technique: None used Indication(s): N/A Exposure Time: No patient exposure Contrast: None used. Fluoroscopic Guidance: N/A Ultrasound Guidance: N/A Interpretation: N/A  Antibiotic Prophylaxis:  Indication(s): None identified Antibiotic given: None  Post-operative Assessment:  EBL: None Complications: No immediate post-treatment complications observed by team, or reported by patient. Note: The patient tolerated the entire procedure well. A repeat set of vitals were taken after the procedure and the patient was kept under observation following institutional policy, for this type of procedure. Post-procedural neurological assessment was performed, showing return to baseline, prior to discharge. The patient was provided with post-procedure discharge instructions, including a section on how to identify potential problems. Should any problems arise concerning this procedure, the patient was given instructions to immediately contact us, at any time, without hesitation. In any case, we plan to contact the patient by telephone for a follow-up status report regarding this interventional procedure. Comments:  No additional relevant information.  Plan of Care   Imaging Orders  No imaging studies ordered today    Procedure Orders     KNEE INJECTION     KNEE  INJECTION  Medications ordered for procedure: Meds ordered this encounter  Medications  . lidocaine (PF) (XYLOCAINE) 1 % injection 5 mL  . Sodium Hyaluronate SOSY 2 mL  . Sodium Hyaluronate SOSY 2 mL  . ropivacaine (PF) 2 mg/mL (0.2%) (NAROPIN) injection 2 mL   Medications administered: We administered lidocaine (PF), Sodium Hyaluronate, and ropivacaine (PF) 2 mg/mL (0.2%).  See the medical record for exact dosing, route, and time of administration.  New Prescriptions   No medications on file   Disposition: Discharge home  Discharge Date & Time: 12/24/2016; 0815 hrs.   Physician-requested Follow-up: Return in about 2 weeks (around 01/07/2017) for Procedure (no sedation): (L) IA Hyalgan #3. Future Appointments Date Time Provider [place]  02/17/2017 8:30 AM Vevelyn Francois, NP [facility] None   Primary Care Physician: Tracie Harrier, MD Location: [facility] Note by: Gaspar Cola, MD Date: 12/24/2016; Time: 8:17 AM  Disclaimer:  Medicine is not an exact science. The only guarantee in medicine is that nothing is guaranteed. It is important to note that the decision to proceed with this intervention was based on the information collected from the patient. The Data and conclusions were drawn from the patient's questionnaire, the interview, and the physical examination. Because the information was provided in large part by the patient, it cannot be guaranteed that it has not been purposely or unconsciously manipulated. Every effort has been made to obtain as much relevant data as possible for this evaluation. It is important to note that the conclusions that lead to this procedure are derived in large part from the available data. Always take into account that the treatment will also be dependent on availability of resources and existing treatment guidelines, considered by other Pain Management Practitioners as being common knowledge and  practice, at the time of the intervention. For Medico-Legal purposes, it is also important to point out that variation in procedural techniques and pharmacological choices are the acceptable norm. The indications, contraindications, technique, and results of the  above procedure should only be interpreted and judged by a Board-Certified Interventional Pain Specialist with extensive familiarity and expertise in the same exact procedure and technique.

## 2016-12-24 ENCOUNTER — Ambulatory Visit: Payer: Medicare Other | Attending: Pain Medicine | Admitting: Pain Medicine

## 2016-12-24 ENCOUNTER — Encounter: Payer: Self-pay | Admitting: Pain Medicine

## 2016-12-24 VITALS — BP 115/61 | HR 63 | Temp 98.3°F | Resp 18 | Ht 70.0 in | Wt 244.0 lb

## 2016-12-24 DIAGNOSIS — M549 Dorsalgia, unspecified: Secondary | ICD-10-CM | POA: Diagnosis not present

## 2016-12-24 DIAGNOSIS — M25562 Pain in left knee: Secondary | ICD-10-CM | POA: Diagnosis present

## 2016-12-24 DIAGNOSIS — G894 Chronic pain syndrome: Secondary | ICD-10-CM | POA: Diagnosis not present

## 2016-12-24 DIAGNOSIS — Z79891 Long term (current) use of opiate analgesic: Secondary | ICD-10-CM | POA: Diagnosis not present

## 2016-12-24 DIAGNOSIS — Z79899 Other long term (current) drug therapy: Secondary | ICD-10-CM | POA: Diagnosis not present

## 2016-12-24 DIAGNOSIS — G8929 Other chronic pain: Secondary | ICD-10-CM

## 2016-12-24 DIAGNOSIS — M1712 Unilateral primary osteoarthritis, left knee: Secondary | ICD-10-CM | POA: Diagnosis not present

## 2016-12-24 MED ORDER — ROPIVACAINE HCL 2 MG/ML IJ SOLN
2.0000 mL | Freq: Once | INTRAMUSCULAR | Status: AC
  Administered 2016-12-24: 10 mL via INTRA_ARTICULAR

## 2016-12-24 MED ORDER — ROPIVACAINE HCL 2 MG/ML IJ SOLN
INTRAMUSCULAR | Status: AC
  Filled 2016-12-24: qty 10

## 2016-12-24 MED ORDER — LIDOCAINE HCL (PF) 1 % IJ SOLN
5.0000 mL | Freq: Once | INTRAMUSCULAR | Status: AC
  Administered 2016-12-24: 5 mL

## 2016-12-24 MED ORDER — SODIUM HYALURONATE (VISCOSUP) 20 MG/2ML IX SOSY
2.0000 mL | PREFILLED_SYRINGE | Freq: Once | INTRA_ARTICULAR | Status: DC
  Filled 2016-12-24: qty 2

## 2016-12-24 MED ORDER — LIDOCAINE HCL 2 % IJ SOLN
INTRAMUSCULAR | Status: AC
  Filled 2016-12-24: qty 20

## 2016-12-24 MED ORDER — LIDOCAINE HCL (PF) 1 % IJ SOLN
INTRAMUSCULAR | Status: AC
  Filled 2016-12-24: qty 5

## 2016-12-24 MED ORDER — SODIUM HYALURONATE (VISCOSUP) 20 MG/2ML IX SOSY
2.0000 mL | PREFILLED_SYRINGE | Freq: Once | INTRA_ARTICULAR | Status: AC
  Administered 2016-12-24: 2 mL via INTRA_ARTICULAR
  Filled 2016-12-24: qty 2

## 2016-12-24 NOTE — Progress Notes (Signed)
Safety precautions to be maintained throughout the outpatient stay will include: orient to surroundings, keep bed in low position, maintain call bell within reach at all times, provide assistance with transfer out of bed and ambulation.  

## 2016-12-24 NOTE — Patient Instructions (Signed)

## 2016-12-25 ENCOUNTER — Telehealth: Payer: Self-pay

## 2016-12-25 NOTE — Telephone Encounter (Signed)
POst procedure phone call.  Left messge.

## 2016-12-28 ENCOUNTER — Ambulatory Visit: Payer: Medicare Other | Admitting: Pain Medicine

## 2016-12-30 LAB — TOXASSURE SELECT 13 (MW), URINE

## 2017-01-07 ENCOUNTER — Ambulatory Visit: Payer: Medicare Other | Attending: Pain Medicine | Admitting: Pain Medicine

## 2017-01-07 ENCOUNTER — Encounter: Payer: Self-pay | Admitting: Pain Medicine

## 2017-01-07 VITALS — BP 133/82 | HR 64 | Temp 98.2°F | Resp 16 | Ht 70.0 in | Wt 244.0 lb

## 2017-01-07 DIAGNOSIS — M549 Dorsalgia, unspecified: Secondary | ICD-10-CM | POA: Insufficient documentation

## 2017-01-07 DIAGNOSIS — M1712 Unilateral primary osteoarthritis, left knee: Secondary | ICD-10-CM | POA: Diagnosis not present

## 2017-01-07 DIAGNOSIS — G8929 Other chronic pain: Secondary | ICD-10-CM | POA: Diagnosis not present

## 2017-01-07 DIAGNOSIS — M25562 Pain in left knee: Secondary | ICD-10-CM | POA: Diagnosis not present

## 2017-01-07 MED ORDER — ROPIVACAINE HCL 2 MG/ML IJ SOLN
2.0000 mL | Freq: Once | INTRAMUSCULAR | Status: AC
  Administered 2017-01-07: 2 mL via INTRA_ARTICULAR
  Filled 2017-01-07: qty 10

## 2017-01-07 MED ORDER — SODIUM HYALURONATE (VISCOSUP) 20 MG/2ML IX SOSY
2.0000 mL | PREFILLED_SYRINGE | Freq: Once | INTRA_ARTICULAR | Status: AC
  Administered 2017-01-07: 2 mL via INTRA_ARTICULAR
  Filled 2017-01-07: qty 2

## 2017-01-07 MED ORDER — LIDOCAINE HCL (PF) 1 % IJ SOLN
5.0000 mL | Freq: Once | INTRAMUSCULAR | Status: AC
  Administered 2017-01-07: 5 mL
  Filled 2017-01-07: qty 5

## 2017-01-07 NOTE — Progress Notes (Signed)
Patient's Name: Howard Maxwell  MRN: [id number]  Referring Provider: Tracie Harrier, MD  DOB: 1946-03-06  PCP: Tracie Harrier, MD  DOS: 01/07/2017  Note by: Gaspar Cola, MD  Service setting: Ambulatory outpatient  Specialty: Interventional Pain Management  Patient type: Established  Location: [facility] (AMB) Pain Management Facility  Visit type: Interventional Procedure   Primary Reason for Visit: Interventional Pain Management Treatment. CC: Knee Pain (left) and Back Pain (low)  Procedure:  Anesthesia, Analgesia, Anxiolysis:  Type: Therapeutic Intra-Articular Hyalgan Knee Injection #3 Region: Lateral infrapatellar Knee Region Level: Knee Joint Laterality: Left knee  Type: Local Anesthesia Local Anesthetic: Lidocaine 1% Route: Infiltration ([place]/IM) IV Access: Declined Sedation: Declined  Indication(s): Analgesia           Indications: 1. Chronic knee pain (Left)   2. 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 Tricompartmental disease of knee (Left)    Pain Score: Pre-procedure: 1 /10 Post-procedure: 0-No pain/10  Post-Procedure Assessment  12/24/2016 Procedure: Therapeutic left sided intra-articular Hyalgan knee injection #2 Pre-procedure pain score:  1/10 Post-procedure pain score: 0/10 (100% relief) Influential Factors: BMI: 35.01 kg/m Intra-procedural challenges: None observed.         Assessment challenges: None detected.              Reported side-effects: None.        Post-procedural adverse reactions or complications: None reported         Sedation: No sedation used. When no sedatives are used, the analgesic levels obtained are directly associated to the effectiveness of the local anesthetics. However, when sedation is provided, the level of analgesia obtained during the initial 1 hour following the intervention, is believed to be the result of a combination of factors. These factors may include, but are not limited to: 1. The  effectiveness of the local anesthetics used. 2. The effects of the analgesic(s) and/or anxiolytic(s) used. 3. The degree of discomfort experienced by the patient at the time of the procedure. 4. The patients ability and reliability in recalling and recording the events. 5. The presence and influence of possible secondary gains and/or psychosocial factors. Reported result: Relief experienced during the 1st hour after the procedure: 100 % (Ultra-Short Term Relief)            Interpretative annotation: Clinically appropriate result. No IV Analgesic or Anxiolytic given, therefore benefits are completely due to Local Anesthetic effects.          Effects of local anesthetic: The analgesic effects attained during this period are directly associated to the localized infiltration of local anesthetics and therefore cary significant diagnostic value as to the etiological location, or anatomical origin, of the pain. Expected duration of relief is directly dependent on the pharmacodynamics of the local anesthetic used. Long-acting (4-6 hours) anesthetics used.  Reported result: Relief during the next 4 to 6 hour after the procedure: 100 % (Short-Term Relief)            Interpretative annotation: Clinically appropriate result. Analgesia during this period is likely to be Local Anesthetic-related.          Long-term benefit: Defined as the period of time past the expected duration of local anesthetics (1 hour for short-acting and 4-6 hours for long-acting). With the possible exception of prolonged sympathetic blockade from the local anesthetics, benefits during this period are typically attributed to, or associated with, other factors such as analgesic sensory neuropraxia, antiinflammatory effects, or beneficial biochemical changes provided by agents other than the  local anesthetics.  Reported result: Extended relief following procedure: 90 % (Long-Term Relief)            Interpretative annotation: Clinically  appropriate result. Good relief.                Current benefits: Defined as reported results that persistent at this point in time.   Analgesia: 90 % Howard Maxwell reports improvement of arthralgia. Function: Howard Maxwell reports improvement in function ROM: Howard Maxwell reports improvement in ROM Interpretative annotation: Ongoing benefit. No permanent benefit expected. Effective therapeutic approach.          Interpretation: Results would suggest this therapy to be effective in the management of Mr. Mish's condition.                  Plan:  Proceed with treatment No.3  Pre-op Assessment:  Howard Maxwell is a 71 y.o. (year old), male patient, seen today for interventional treatment. He  has a past surgical history that includes Foot surgery (Bilateral); Anterior cervical decomp/discectomy fusion; Gallbladder surgery; Femur fracture surgery; Eye surgery; Nasal reconstruction; Cardiac catheterization (12/07/2013); Back surgery; Fracture surgery; Cholecystectomy; Esophagogastroduodenoscopy (N/A, 12/18/2016); and Colonoscopy with propofol (N/A, 12/18/2016). Howard Maxwell has a current medication list which includes the following prescription(s): atorvastatin, calcium, carvedilol, vitamin d3, coq10, ferrous sulfate, fluocinonide gel, glipizide, glucose blood, losartan-hydrochlorothiazide, magnesium oxide, metformin, misc natural products, naphazoline, neomycin-polymyxin-dexameth, omeprazole, oxycodone, polyethyl glycol-propyl glycol, polyethylene glycol powder, polyethylene glycol-electrolytes, potassium, prevnar 13, tizanidine, vitamin b-12, vitamin c, fluzone high-dose, and spironolactone. His primarily concern today is the Knee Pain (left) and Back Pain (low)  Initial Vital Signs: There were no vitals taken for this visit. BMI: Estimated body mass index is 35.01 kg/m as calculated from the following:   Height as of this encounter: 5\' 10"  (1.778 m).   Weight as of this encounter: 244 lb (110.7  kg).  Risk Assessment: Allergies: Reviewed. He is allergic to codeine sulfate and loratadine.  Allergy Precautions: None required Coagulopathies: Reviewed. None identified.  Blood-thinner therapy: None at this time Active Infection(s): Reviewed. None identified. Mr. Kinser is afebrile  Site Confirmation: Mr. Huntsman was asked to confirm the procedure and laterality before marking the site Procedure checklist: Completed Consent: Before the procedure and under the influence of no sedative(s), amnesic(s), or anxiolytics, the patient was informed of the treatment options, risks and possible complications. To fulfill our ethical and legal obligations, as recommended by the American Medical Association's Code of Ethics, I have informed the patient of my clinical impression; the nature and purpose of the treatment or procedure; the risks, benefits, and possible complications of the intervention; the alternatives, including doing nothing; the risk(s) and benefit(s) of the alternative treatment(s) or procedure(s); and the risk(s) and benefit(s) of doing nothing. The patient was provided information about the general risks and possible complications associated with the procedure. These may include, but are not limited to: failure to achieve desired goals, infection, bleeding, organ or nerve damage, allergic reactions, paralysis, and death. In addition, the patient was informed of those risks and complications associated to the procedure, such as failure to decrease pain; infection; bleeding; organ or nerve damage with subsequent damage to sensory, motor, and/or autonomic systems, resulting in permanent pain, numbness, and/or weakness of one or several areas of the body; allergic reactions; (i.e.: anaphylactic reaction); and/or death. Furthermore, the patient was informed of those risks and complications associated with the medications. These include, but are not limited to: allergic reactions (i.e.:  anaphylactic or anaphylactoid reaction(s));  adrenal axis suppression; blood sugar elevation that in diabetics may result in ketoacidosis or comma; water retention that in patients with history of congestive heart failure may result in shortness of breath, pulmonary edema, and decompensation with resultant heart failure; weight gain; swelling or edema; medication-induced neural toxicity; particulate matter embolism and blood vessel occlusion with resultant organ, and/or nervous system infarction; and/or aseptic necrosis of one or more joints. Finally, the patient was informed that Medicine is not an exact science; therefore, there is also the possibility of unforeseen or unpredictable risks and/or possible complications that may result in a catastrophic outcome. The patient indicated having understood very clearly. We have given the patient no guarantees and we have made no promises. Enough time was given to the patient to ask questions, all of which were answered to the patient's satisfaction. Mr. Wolke has indicated that he wanted to continue with the procedure. Attestation: I, the ordering provider, attest that I have discussed with the patient the benefits, risks, side-effects, alternatives, likelihood of achieving goals, and potential problems during recovery for the procedure that I have provided informed consent. Date: 01/07/2017; Time: 6:54 AM  Pre-Procedure Preparation:  Monitoring: As per clinic protocol. Respiration, ETCO2, SpO2, BP, heart rate and rhythm monitor placed and checked for adequate function Safety Precautions: Patient was assessed for positional comfort and pressure points before starting the procedure. Time-out: I initiated and conducted the "Time-out" before starting the procedure, as per protocol. The patient was asked to participate by confirming the accuracy of the "Time Out" information. Verification of the correct person, site, and procedure were performed and confirmed by  me, the nursing staff, and the patient. "Time-out" conducted as per Joint Commission's Universal Protocol (UP.01.01.01). "Time-out" Date & Time: 01/07/2017; 0805 hrs.  Description of Procedure Process:   Position: Sitting Target Area: Knee Joint Approach: Just above the Lateral tibial plateau, lateral to the infrapatellar tendon. Area Prepped: Entire knee area, from the mid-thigh to the mid-shin. Prepping solution: ChloraPrep (2% chlorhexidine gluconate and 70% isopropyl alcohol) Safety Precautions: Aspiration looking for blood return was conducted prior to all injections. At no point did we inject any substances, as a needle was being advanced. No attempts were made at seeking any paresthesias. Safe injection practices and needle disposal techniques used. Medications properly checked for expiration dates. SDV (single dose vial) medications used. Description of the Procedure: Protocol guidelines were followed. The patient was placed in position over the fluoroscopy table. The target area was identified and the area prepped in the usual manner. Skin desensitized using vapocoolant spray. Skin & deeper tissues infiltrated with local anesthetic. Appropriate amount of time allowed to pass for local anesthetics to take effect. The procedure needles were then advanced to the target area. Proper needle placement secured. Negative aspiration confirmed. Solution injected in intermittent fashion, asking for systemic symptoms every 0.5cc of injectate. The needles were then removed and the area cleansed, making sure to leave some of the prepping solution back to take advantage of its long term bactericidal properties. Vitals:   01/07/17 0745 01/07/17 0747 01/07/17 0800 01/07/17 0808  BP:  130/65 136/69 133/82  Pulse: 64     Resp: 18  16 16   Temp: 98.2 F (36.8 C)     SpO2: 97%  98% 98%  Weight: 244 lb (110.7 kg)     Height: 5\' 10"  (1.778 m)       Start Time: 0805 hrs. End Time: 0808 hrs. Materials:   Needle(s) Type: Regular needle Gauge: 22G Length: 3.5-in  Medication(s): We administered lidocaine (PF), Sodium Hyaluronate, and ropivacaine (PF) 2 mg/mL (0.2%). Please see chart orders for dosing details.  Imaging Guidance:  Type of Imaging Technique: None used Indication(s): N/A Exposure Time: No patient exposure Contrast: None used. Fluoroscopic Guidance: N/A Ultrasound Guidance: N/A Interpretation: N/A  Antibiotic Prophylaxis:  Indication(s): None identified Antibiotic given: None  Post-operative Assessment:  EBL: None Complications: No immediate post-treatment complications observed by team, or reported by patient. Note: The patient tolerated the entire procedure well. A repeat set of vitals were taken after the procedure and the patient was kept under observation following institutional policy, for this type of procedure. Post-procedural neurological assessment was performed, showing return to baseline, prior to discharge. The patient was provided with post-procedure discharge instructions, including a section on how to identify potential problems. Should any problems arise concerning this procedure, the patient was given instructions to immediately contact us, at any time, without hesitation. In any case, we plan to contact the patient by telephone for a follow-up status report regarding this interventional procedure. Comments:  No additional relevant information.  Plan of Care   Imaging Orders  No imaging studies ordered today    Procedure Orders     KNEE INJECTION     KNEE INJECTION  Medications ordered for procedure: Meds ordered this encounter  Medications  . lidocaine (PF) (XYLOCAINE) 1 % injection 5 mL  . Sodium Hyaluronate SOSY 2 mL  . ropivacaine (PF) 2 mg/mL (0.2%) (NAROPIN) injection 2 mL   Medications administered: We administered lidocaine (PF), Sodium Hyaluronate, and ropivacaine (PF) 2 mg/mL (0.2%).  See the medical record for exact dosing, route, and  time of administration.  New Prescriptions   No medications on file   Disposition: Discharge home  Discharge Date & Time: 01/07/2017; 0810 hrs.   Physician-requested Follow-up: Return for Procedure (no sedation): (L) Hyalgan Knee injection #4. Future Appointments Date Time Provider [place]  01/21/2017 7:45 AM Milinda Pointer, MD [facility] None  02/17/2017 8:30 AM Vevelyn Francois, NP [facility] None  06/08/2017 11:30 AM Venia Carbon, MD [id number]   Primary Care Physician: Tracie Harrier, MD Location: [facility] Note by: Gaspar Cola, MD Date: 01/07/2017; Time: 8:19 AM  Disclaimer:  Medicine is not an exact science. The only guarantee in medicine is that nothing is guaranteed. It is important to note that the decision to proceed with this intervention was based on the information collected from the patient. The Data and conclusions were drawn from the patient's questionnaire, the interview, and the physical examination. Because the information was provided in large part by the patient, it cannot be guaranteed that it has not been purposely or unconsciously manipulated. Every effort has been made to obtain as much relevant data as possible for this evaluation. It is important to note that the conclusions that lead to this procedure are derived in large part from the available data. Always take into account that the treatment will also be dependent on availability of resources and existing treatment guidelines, considered by other Pain Management Practitioners as being common knowledge and practice, at the time of the intervention. For Medico-Legal purposes, it is also important to point out that variation in procedural techniques and pharmacological choices are the acceptable norm. The indications, contraindications, technique, and results of the above procedure should only be interpreted and judged by a Board-Certified  Interventional Pain Specialist with extensive familiarity and expertise in the same exact procedure and technique.

## 2017-01-07 NOTE — Patient Instructions (Signed)

## 2017-01-08 ENCOUNTER — Telehealth: Payer: Self-pay

## 2017-01-08 NOTE — Telephone Encounter (Signed)
Denies any needs at this time. Instructed to call if needed. 

## 2017-01-21 ENCOUNTER — Ambulatory Visit: Payer: Medicare Other | Admitting: Pain Medicine

## 2017-01-27 NOTE — Progress Notes (Signed)
Patient's Name: Howard Maxwell  MRN: [id number]  Referring Provider: Tracie Harrier, MD  DOB: [birth year]  PCP: Tracie Harrier, MD  DOS: 01/28/2017  Note by: Gaspar Cola, MD  Service setting: Ambulatory outpatient  Specialty: Interventional Pain Management  Patient type: Established  Location: [facility] (AMB) Pain Management Facility  Visit type: Interventional Procedure   Primary Reason for Visit: Interventional Pain Management Treatment. CC: Knee Pain (left)  Procedure:  Anesthesia, Analgesia, Anxiolysis:  Type: Therapeutic Intra-Articular Hyalgan Knee Injection #4 Region: Lateral infrapatellar Knee Region Level: Knee Joint Laterality: Left knee  Type: Local Anesthesia Local Anesthetic: Lidocaine 1% Route: Infiltration (Delia/IM) IV Access: Declined Sedation: Declined  Indication(s): Analgesia           Indications: 1. Chronic knee pain (Left)   2. 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 (year old), male patient, seen today for interventional treatment. He  has a past surgical history that includes Foot surgery (Bilateral); Anterior cervical decomp/discectomy fusion; Gallbladder surgery; Femur fracture surgery; Eye surgery; Nasal reconstruction; Cardiac catheterization (12/07/2013); Back surgery; Fracture surgery; Cholecystectomy; ESOPHAGOGASTRODUODENOSCOPY (EGD) (N/A, 12/18/2016); and COLONOSCOPY WITH PROPOFOL (N/A, 12/18/2016). Howard Maxwell has a current medication list which includes the following prescription(s): atorvastatin, calcium, carvedilol, vitamin d3, coq10, ferrous sulfate, fluocinonide gel, glipizide, glucose blood, krill oil, losartan-hydrochlorothiazide, metformin, misc natural products, naphazoline, neomycin-polymyxin-dexameth, omeprazole, polyethyl glycol-propyl glycol, polyethylene glycol powder, polyethylene  glycol-electrolytes, potassium, prevnar 13, tizanidine, vitamin b-12, vitamin c, fluzone high-dose, magnesium oxide, oxycodone, and spironolactone. His primarily concern today is the Knee Pain (left)  Initial Vital Signs: There were no vitals taken for this visit. BMI: Estimated body mass index is 35.58 kg/m as calculated from the following:   Height as of this encounter: 5\' 10"  (1.778 m).   Weight as of this encounter: 248 lb (112.5 kg).  Risk Assessment: Allergies: Reviewed. He is allergic to codeine sulfate and loratadine.  Allergy Precautions: None required Coagulopathies: Reviewed. None identified.  Blood-thinner therapy: None at this time Active Infection(s): Reviewed. None identified. Howard Maxwell is afebrile  Site Confirmation: Howard Maxwell was asked to confirm the procedure and laterality before marking the site Procedure checklist: Completed Consent: Before the procedure and under the influence of no sedative(s), amnesic(s), or anxiolytics, the patient was informed of the treatment options, risks and possible complications. To fulfill our ethical and legal obligations, as recommended by the American Medical Association's Code of Ethics, I have informed the patient of my clinical impression; the nature and purpose of the treatment or procedure; the risks, benefits, and possible complications of the intervention; the alternatives, including doing nothing; the risk(s) and benefit(s) of the alternative treatment(s) or procedure(s); and the risk(s) and benefit(s) of doing nothing. The patient was provided information about the general risks and possible complications associated with the procedure. These may include, but are not limited to: failure to achieve desired goals, infection, bleeding, organ or nerve damage, allergic reactions, paralysis, and death. In addition, the patient was informed of those risks and complications associated to the procedure, such as failure to decrease pain;  infection; bleeding; organ or nerve damage with subsequent damage to sensory, motor, and/or autonomic systems, resulting in permanent pain, numbness, and/or weakness of one or several areas of the body; allergic reactions; (i.e.: anaphylactic reaction); and/or death. Furthermore, the patient was informed of those risks and complications associated with the medications. These include, but are not limited  to: allergic reactions (i.e.: anaphylactic or anaphylactoid reaction(s)); adrenal axis suppression; blood sugar elevation that in diabetics may result in ketoacidosis or comma; water retention that in patients with history of congestive heart failure may result in shortness of breath, pulmonary edema, and decompensation with resultant heart failure; weight gain; swelling or edema; medication-induced neural toxicity; particulate matter embolism and blood vessel occlusion with resultant organ, and/or nervous system infarction; and/or aseptic necrosis of one or more joints. Finally, the patient was informed that Medicine is not an exact science; therefore, there is also the possibility of unforeseen or unpredictable risks and/or possible complications that may result in a catastrophic outcome. The patient indicated having understood very clearly. We have given the patient no guarantees and we have made no promises. Enough time was given to the patient to ask questions, all of which were answered to the patient's satisfaction. Howard Maxwell has indicated that he wanted to continue with the procedure. Attestation: I, the ordering provider, attest that I have discussed with the patient the benefits, risks, side-effects, alternatives, likelihood of achieving goals, and potential problems during recovery for the procedure that I have provided informed consent. Date: 01/28/2017; Time: 6:23 PM  Pre-Procedure Preparation:  Monitoring: As per clinic protocol. Respiration, ETCO2, SpO2, BP, heart rate and rhythm monitor  placed and checked for adequate function Safety Precautions: Patient was assessed for positional comfort and pressure points before starting the procedure. Time-out: I initiated and conducted the "Time-out" before starting the procedure, as per protocol. The patient was asked to participate by confirming the accuracy of the "Time Out" information. Verification of the correct person, site, and procedure were performed and confirmed by me, the nursing staff, and the patient. "Time-out" conducted as per Joint Commission's Universal Protocol (UP.01.01.01). "Time-out" Date & Time: 01/28/2017; 0818 hrs.  Description of Procedure Process:   Position: Sitting Target Area: Knee Joint Approach: Just above the Lateral tibial plateau, lateral to the infrapatellar tendon. Area Prepped: Entire knee area, from the mid-thigh to the mid-shin. Prepping solution: ChloraPrep (2% chlorhexidine gluconate and 70% isopropyl alcohol) Safety Precautions: Aspiration looking for blood return was conducted prior to all injections. At no point did we inject any substances, as a needle was being advanced. No attempts were made at seeking any paresthesias. Safe injection practices and needle disposal techniques used. Medications properly checked for expiration dates. SDV (single dose vial) medications used. Description of the Procedure: Protocol guidelines were followed. The patient was placed in position over the fluoroscopy table. The target area was identified and the area prepped in the usual manner. Skin desensitized using vapocoolant spray. Skin & deeper tissues infiltrated with local anesthetic. Appropriate amount of time allowed to pass for local anesthetics to take effect. The procedure needles were then advanced to the target area. Proper needle placement secured. Negative aspiration confirmed. Solution injected in intermittent fashion, asking for systemic symptoms every 0.5cc of injectate. The needles were then removed and  the area cleansed, making sure to leave some of the prepping solution back to take advantage of its long term bactericidal properties. Vitals:   01/28/17 0750 01/28/17 0808 01/28/17 0817 01/28/17 0820  BP: 128/63 134/68 (!) 145/71 124/69  Pulse: 62 60 60 (!) 56  Resp: 16 16 16 16   Temp: 98.5 F (36.9 C)     SpO2: 97% 96% 95% 96%  Weight: 248 lb (112.5 kg)     Height: 5\' 10"  (1.778 m)       Start Time: 0818 hrs. End Time: 0820 hrs.  Materials:  Needle(s) Type: Regular needle Gauge: 22G Length: 3.5-in Medication(s): We administered lidocaine (PF), Sodium Hyaluronate, and ropivacaine (PF) 2 mg/mL (0.2%). Please see chart orders for dosing details.  Imaging Guidance:  Type of Imaging Technique: None used Indication(s): N/A Exposure Time: No patient exposure Contrast: None used. Fluoroscopic Guidance: N/A Ultrasound Guidance: N/A Interpretation: N/A  Antibiotic Prophylaxis:  Indication(s): None identified Antibiotic given: None  Post-operative Assessment:  EBL: None Complications: No immediate post-treatment complications observed by team, or reported by patient. Note: The patient tolerated the entire procedure well. A repeat set of vitals were taken after the procedure and the patient was kept under observation following institutional policy, for this type of procedure. Post-procedural neurological assessment was performed, showing return to baseline, prior to discharge. The patient was provided with post-procedure discharge instructions, including a section on how to identify potential problems. Should any problems arise concerning this procedure, the patient was given instructions to immediately contact us, at any time, without hesitation. In any case, we plan to contact the patient by telephone for a follow-up status report regarding this interventional procedure. Comments:  No additional relevant information.  Plan of Care   Imaging Orders  No imaging studies ordered today     Procedure Orders     KNEE INJECTION     KNEE INJECTION  Medications ordered for procedure: Meds ordered this encounter  Medications  . lidocaine (PF) (XYLOCAINE) 1 % injection 5 mL  . Sodium Hyaluronate SOSY 2 mL  . ropivacaine (PF) 2 mg/mL (0.2%) (NAROPIN) injection 2 mL   Medications administered: We administered lidocaine (PF), Sodium Hyaluronate, and ropivacaine (PF) 2 mg/mL (0.2%).  See the medical record for exact dosing, route, and time of administration.  This SmartLink is deprecated. Use AVSMEDLIST instead to display the medication list for a patient. Disposition: Discharge home  Discharge Date & Time: 01/28/2017; 0821 hrs.   Physician-requested Follow-up: Return for Procedure (no sedation): (L) IA Hyalgan Knee inj #5. Future Appointments  Date Time Provider [place]  02/18/2017  7:45 AM Milinda Pointer, MD [facility] None  06/08/2017 11:30 AM Venia Carbon, MD [facility]   Primary Care Physician: Tracie Harrier, MD Location: [facility] Out[facility] Note by: Gaspar Cola, MD Date: 01/28/2017; Time: 8:37 AM  Disclaimer:  Medicine is not an exact science. The only guarantee in medicine is that nothing is guaranteed. It is important to note that the decision to proceed with this intervention was based on the information collected from the patient. The Data and conclusions were drawn from the patient's questionnaire, the interview, and the physical examination. Because the information was provided in large part by the patient, it cannot be guaranteed that it has not been purposely or unconsciously manipulated. Every effort has been made to obtain as much relevant data as possible for this evaluation. It is important to note that the conclusions that lead to this procedure are derived in large part from the available data. Always take into account that the treatment will also be dependent on availability of resources and existing  treatment guidelines, considered by other Pain Management Practitioners as being common knowledge and practice, at the time of the intervention. For Medico-Legal purposes, it is also important to point out that variation in procedural techniques and pharmacological choices are the acceptable norm. The indications, contraindications, technique, and results of the above procedure should only be interpreted and judged by a Board-Certified Interventional Pain Specialist with extensive familiarity and expertise in the same exact  procedure and technique.

## 2017-01-28 ENCOUNTER — Encounter: Payer: Self-pay | Admitting: Pain Medicine

## 2017-01-28 ENCOUNTER — Ambulatory Visit: Payer: Medicare Other | Attending: Pain Medicine | Admitting: Pain Medicine

## 2017-01-28 VITALS — BP 124/69 | HR 56 | Temp 98.5°F | Resp 16 | Ht 70.0 in | Wt 248.0 lb

## 2017-01-28 DIAGNOSIS — Z79891 Long term (current) use of opiate analgesic: Secondary | ICD-10-CM | POA: Insufficient documentation

## 2017-01-28 DIAGNOSIS — M1712 Unilateral primary osteoarthritis, left knee: Secondary | ICD-10-CM | POA: Insufficient documentation

## 2017-01-28 DIAGNOSIS — Z888 Allergy status to other drugs, medicaments and biological substances status: Secondary | ICD-10-CM | POA: Insufficient documentation

## 2017-01-28 DIAGNOSIS — G8929 Other chronic pain: Secondary | ICD-10-CM | POA: Insufficient documentation

## 2017-01-28 DIAGNOSIS — Z7984 Long term (current) use of oral hypoglycemic drugs: Secondary | ICD-10-CM | POA: Diagnosis not present

## 2017-01-28 DIAGNOSIS — Z79899 Other long term (current) drug therapy: Secondary | ICD-10-CM | POA: Insufficient documentation

## 2017-01-28 DIAGNOSIS — Z885 Allergy status to narcotic agent status: Secondary | ICD-10-CM | POA: Diagnosis not present

## 2017-01-28 DIAGNOSIS — M25562 Pain in left knee: Secondary | ICD-10-CM | POA: Insufficient documentation

## 2017-01-28 MED ORDER — ROPIVACAINE HCL 2 MG/ML IJ SOLN
2.0000 mL | Freq: Once | INTRAMUSCULAR | Status: AC
  Administered 2017-01-28: 10 mL via INTRA_ARTICULAR

## 2017-01-28 MED ORDER — LIDOCAINE HCL (PF) 1 % IJ SOLN
INTRAMUSCULAR | Status: AC
  Filled 2017-01-28: qty 5

## 2017-01-28 MED ORDER — SODIUM HYALURONATE (VISCOSUP) 20 MG/2ML IX SOSY
2.0000 mL | PREFILLED_SYRINGE | Freq: Once | INTRA_ARTICULAR | Status: AC
  Administered 2017-01-28: 2 mL via INTRA_ARTICULAR
  Filled 2017-01-28: qty 2

## 2017-01-28 MED ORDER — ROPIVACAINE HCL 2 MG/ML IJ SOLN
INTRAMUSCULAR | Status: AC
  Filled 2017-01-28: qty 10

## 2017-01-28 MED ORDER — LIDOCAINE HCL (PF) 1 % IJ SOLN
5.0000 mL | Freq: Once | INTRAMUSCULAR | Status: AC
  Administered 2017-01-28: 5 mL

## 2017-01-28 NOTE — Patient Instructions (Signed)

## 2017-01-28 NOTE — Progress Notes (Signed)
Nursing Pain Medication Assessment:  Safety precautions to be maintained throughout the outpatient stay will include: orient to surroundings, keep bed in low position, maintain call bell within reach at all times, provide assistance with transfer out of bed and ambulation.  Medication Inspection Compliance: Pill count conducted under aseptic conditions, in front of the patient. Neither the pills nor the bottle was removed from the patient's sight at any time. Once count was completed pills were immediately returned to the patient in their original bottle.  Medication: Oxycodone IR Pill/Patch Count: 36 of 120 pills remain Pill/Patch Appearance: Markings consistent with prescribed medication Bottle Appearance: Standard pharmacy container. Clearly labeled. Filled Date: 29 / 22 / 2018 Last Medication intake:  Today

## 2017-01-29 ENCOUNTER — Telehealth: Payer: Self-pay

## 2017-01-29 NOTE — Telephone Encounter (Signed)
Post procedure phone call.  Patient states he is doing good.  

## 2017-02-17 ENCOUNTER — Ambulatory Visit: Payer: Medicare Other | Admitting: Nurse Practitioner

## 2017-02-18 ENCOUNTER — Ambulatory Visit: Payer: Medicare Other | Admitting: Pain Medicine

## 2017-02-22 ENCOUNTER — Ambulatory Visit
Admission: RE | Admit: 2017-02-22 | Payer: Medicare Other | Source: Ambulatory Visit | Admitting: Unknown Physician Specialty

## 2017-02-22 ENCOUNTER — Encounter: Admission: RE | Payer: Self-pay | Source: Ambulatory Visit

## 2017-02-22 SURGERY — COLONOSCOPY WITH PROPOFOL
Anesthesia: General

## 2017-03-02 ENCOUNTER — Ambulatory Visit: Payer: Medicare Other | Admitting: Pain Medicine

## 2017-03-08 ENCOUNTER — Ambulatory Visit: Payer: Medicare Other | Attending: Nurse Practitioner | Admitting: Nurse Practitioner

## 2017-03-08 ENCOUNTER — Encounter: Payer: Self-pay | Admitting: Nurse Practitioner

## 2017-03-08 ENCOUNTER — Other Ambulatory Visit: Payer: Self-pay

## 2017-03-08 VITALS — BP 113/97 | HR 95 | Temp 98.0°F | Ht 70.0 in | Wt 250.0 lb

## 2017-03-08 DIAGNOSIS — E559 Vitamin D deficiency, unspecified: Secondary | ICD-10-CM | POA: Diagnosis not present

## 2017-03-08 DIAGNOSIS — R531 Weakness: Secondary | ICD-10-CM | POA: Diagnosis not present

## 2017-03-08 DIAGNOSIS — Z79899 Other long term (current) drug therapy: Secondary | ICD-10-CM | POA: Insufficient documentation

## 2017-03-08 DIAGNOSIS — M545 Low back pain, unspecified: Secondary | ICD-10-CM

## 2017-03-08 DIAGNOSIS — Z885 Allergy status to narcotic agent status: Secondary | ICD-10-CM | POA: Insufficient documentation

## 2017-03-08 DIAGNOSIS — I471 Supraventricular tachycardia: Secondary | ICD-10-CM | POA: Insufficient documentation

## 2017-03-08 DIAGNOSIS — I25110 Atherosclerotic heart disease of native coronary artery with unstable angina pectoris: Secondary | ICD-10-CM | POA: Diagnosis not present

## 2017-03-08 DIAGNOSIS — G4730 Sleep apnea, unspecified: Secondary | ICD-10-CM | POA: Insufficient documentation

## 2017-03-08 DIAGNOSIS — E1140 Type 2 diabetes mellitus with diabetic neuropathy, unspecified: Secondary | ICD-10-CM | POA: Insufficient documentation

## 2017-03-08 DIAGNOSIS — E785 Hyperlipidemia, unspecified: Secondary | ICD-10-CM | POA: Insufficient documentation

## 2017-03-08 DIAGNOSIS — Z9049 Acquired absence of other specified parts of digestive tract: Secondary | ICD-10-CM | POA: Insufficient documentation

## 2017-03-08 DIAGNOSIS — Z720 Tobacco use: Secondary | ICD-10-CM | POA: Diagnosis not present

## 2017-03-08 DIAGNOSIS — K219 Gastro-esophageal reflux disease without esophagitis: Secondary | ICD-10-CM | POA: Insufficient documentation

## 2017-03-08 DIAGNOSIS — Z7984 Long term (current) use of oral hypoglycemic drugs: Secondary | ICD-10-CM | POA: Insufficient documentation

## 2017-03-08 DIAGNOSIS — I219 Acute myocardial infarction, unspecified: Secondary | ICD-10-CM | POA: Insufficient documentation

## 2017-03-08 DIAGNOSIS — M48061 Spinal stenosis, lumbar region without neurogenic claudication: Secondary | ICD-10-CM | POA: Diagnosis not present

## 2017-03-08 DIAGNOSIS — M4802 Spinal stenosis, cervical region: Secondary | ICD-10-CM | POA: Diagnosis not present

## 2017-03-08 DIAGNOSIS — Z882 Allergy status to sulfonamides status: Secondary | ICD-10-CM | POA: Diagnosis not present

## 2017-03-08 DIAGNOSIS — I10 Essential (primary) hypertension: Secondary | ICD-10-CM | POA: Diagnosis not present

## 2017-03-08 DIAGNOSIS — G894 Chronic pain syndrome: Secondary | ICD-10-CM | POA: Diagnosis not present

## 2017-03-08 DIAGNOSIS — Z888 Allergy status to other drugs, medicaments and biological substances status: Secondary | ICD-10-CM | POA: Insufficient documentation

## 2017-03-08 DIAGNOSIS — M5116 Intervertebral disc disorders with radiculopathy, lumbar region: Secondary | ICD-10-CM | POA: Diagnosis not present

## 2017-03-08 DIAGNOSIS — J449 Chronic obstructive pulmonary disease, unspecified: Secondary | ICD-10-CM | POA: Insufficient documentation

## 2017-03-08 DIAGNOSIS — M25562 Pain in left knee: Secondary | ICD-10-CM | POA: Insufficient documentation

## 2017-03-08 DIAGNOSIS — M4322 Fusion of spine, cervical region: Secondary | ICD-10-CM | POA: Insufficient documentation

## 2017-03-08 DIAGNOSIS — M7918 Myalgia, other site: Secondary | ICD-10-CM | POA: Insufficient documentation

## 2017-03-08 DIAGNOSIS — M25559 Pain in unspecified hip: Secondary | ICD-10-CM | POA: Diagnosis not present

## 2017-03-08 DIAGNOSIS — G8929 Other chronic pain: Secondary | ICD-10-CM

## 2017-03-08 DIAGNOSIS — M1610 Unilateral primary osteoarthritis, unspecified hip: Secondary | ICD-10-CM | POA: Diagnosis not present

## 2017-03-08 DIAGNOSIS — D649 Anemia, unspecified: Secondary | ICD-10-CM | POA: Insufficient documentation

## 2017-03-08 DIAGNOSIS — E669 Obesity, unspecified: Secondary | ICD-10-CM | POA: Diagnosis not present

## 2017-03-08 DIAGNOSIS — Z9889 Other specified postprocedural states: Secondary | ICD-10-CM | POA: Insufficient documentation

## 2017-03-08 MED ORDER — OXYCODONE HCL 5 MG PO TABS: 5.0000 mg | ORAL_TABLET | Freq: Four times a day (QID) | ORAL | 0 refills | Status: DC | PRN

## 2017-03-08 NOTE — Progress Notes (Signed)
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 procedure Diagnostic bilateral intra-articular hipjoint injection  Diagnostic bilateral femoral nerve and obturator nerveblock  Possible bilateral femoral nerve and obturator nerve RFA Diagnostic left cervical epidural steroid injection  Diagnostic bilateral cervical facet block Possible bilateral cervical facet RFA Possible series of 5 left intra-articular Hyalganknee injection  Diagnostic left Genicular nerve block Possible left Genicular nerve radiofrequencyablation  Possible intrathecal opioid trial   Palliative PRN treatment(s):   None at this time      Provider-requested follow-up: Return in about 3 months (around 06/06/2017) for MedMgmt with Me Donella Stade [facility] Pace).  Future Appointments  Date Time Provider [place]  06/08/2017 11:30 AM Venia Carbon, MD [facility]   Primary Care  Physician: Tracie Harrier, MD Location: [facility] Note by: Vevelyn Francois NP Date: 03/08/2017; Time: 8:51 AM  Pain Score Disclaimer: We use the NRS-11 scale. This is a self-reported, subjective measurement of pain severity with only modest accuracy. It is used primarily to identify changes within a particular patient. It must be understood that outpatient pain scales are significantly less accurate that those used for research, where they can be applied under ideal controlled circumstances with minimal exposure to variables. In reality, the score is likely to be a combination of pain intensity and pain affect, where pain affect describes the degree of emotional arousal or changes in action readiness caused by the sensory experience of pain. Factors such as social and work situation, setting, emotional state, anxiety levels, expectation, and prior pain experience may influence pain perception and show large inter-individual differences that may also be affected by time variables.  Patient instructions provided during this appointment: Patient Instructions    ____________________________________________________________________________________________  Medication Rules  Applies to: All patients receiving prescriptions (written or electronic).  Pharmacy of record: Pharmacy where electronic prescriptions will be sent. If written prescriptions are taken to a different pharmacy, please inform the nursing staff. The pharmacy listed in the electronic medical record should be the one where you would like electronic prescriptions to be sent.  Prescription refills: Only during scheduled appointments. Applies to both, written and electronic prescriptions.  NOTE: The following applies primarily to controlled substances (Opioid* Pain Medications).   Patient's responsibilities: 1. Pain Pills: Bring all pain pills to every appointment (except for procedure  appointments). 2. Pill Bottles: Bring pills in original pharmacy bottle. Always bring newest bottle. Bring bottle, even if empty. 3. Medication refills: You are responsible for knowing and keeping track of what medications you need refilled. The day before your appointment, write a list of all prescriptions that need to be refilled. Bring that list to your appointment and give it to the admitting nurse. Prescriptions will be written only during appointments. If you forget a medication, it will not be "Called in", "Faxed",  or "electronically sent". You will need to get another appointment to get these prescribed. 4. Prescription Accuracy: You are responsible for carefully inspecting your prescriptions before leaving our office. Have the discharge nurse carefully go over each prescription with you, before taking them home. Make sure that your name is accurately spelled, that your address is correct. Check the name and dose of your medication to make sure it is accurate. Check the number of pills, and the written instructions to make sure they are clear and accurate. Make sure that you are given enough medication to last until your next medication refill appointment. 5. Taking Medication: Take medication as prescribed. Never take more pills than instructed. Never take medication more frequently than prescribed. Taking less pills or less frequently is permitted and encouraged, when it comes to controlled substances (written prescriptions).  6. Inform other Doctors: Always inform, all of your healthcare providers, of all the medications you take. 7. Pain Medication from other Providers: You are not allowed to accept any additional pain medication from any other Doctor or Healthcare provider. There are two exceptions to this rule. (see below) In the event that you require additional pain medication, you are responsible for notifying us, as stated below. 8. Medication Agreement: You are responsible for carefully  reading and following our Medication Agreement. This must be signed before receiving any prescriptions from our practice. Safely store a copy of your signed Agreement. Violations to the Agreement will result in no further prescriptions. (Additional copies of our Medication Agreement are available upon request.) 9. Laws, Rules, & Regulations: All patients are expected to follow all Federal and [employer], Rules, [employer]. Ignorance of the Laws does not constitute a valid excuse. The use of any illegal substances is prohibited. 10. Adopted CDC guidelines & recommendations: Target dosing levels will be at or below 60 MME/day. Use of benzodiazepines** is not recommended.  Exceptions: There are only two exceptions to the rule of not receiving pain medications from other Healthcare Providers. 1. Exception #1 (Emergencies): In the event of an emergency (i.e.: accident requiring emergency care), you are allowed to receive additional pain medication. However, you are responsible for: As soon as you are able, call our office [phone], at any time of the day or night, and leave a message stating your name, the date and nature of the emergency, and the name and dose of the medication prescribed. In the event that your call is answered by a member of our staff, make sure to document and save the date, time, and the name of the person that took your information.  2. Exception #2 (Planned Surgery): In the event that you are scheduled by another doctor or dentist to have any type of surgery or procedure, you are allowed (for a period no longer than 30 days), to receive additional pain medication, for the acute post-op pain. However, in this case, you are responsible for picking up a copy of our "Post-op Pain Management for Surgeons" handout, and giving it to your surgeon or dentist. This document is available at our office, and does not require an appointment to obtain it. Simply go to our office during  business hours (Monday-Thursday from 8:00 AM to 4:00 PM) (Friday 8:00 AM to 12:00 Noon) or if you have a scheduled appointment with [place], prior to your surgery, and ask for it by name. In addition, you will need to provide [place] with your name, name of your surgeon, type of surgery, and date of procedure  or surgery.  *Opioid medications include: morphine, codeine, oxycodone, oxymorphone, hydrocodone, hydromorphone, meperidine, tramadol, tapentadol, buprenorphine, fentanyl, methadone. **Benzodiazepine medications include: diazepam (Valium), alprazolam (Xanax), clonazepam (Klonopine), lorazepam (Ativan), clorazepate (Tranxene), chlordiazepoxide (Librium), estazolam (Prosom), oxazepam (Serax), temazepam (Restoril), triazolam (Halcion)  ____________________________________________________________________________________________   BMI interpretation table: BMI level Category Range association with higher incidence of chronic pain  <18 kg/m2 Underweight   18.5-24.9 kg/m2 Ideal body weight   25-29.9 kg/m2 Overweight Increased incidence by 20%  30-34.9 kg/m2 Obese (Class I) Increased incidence by 68%  35-39.9 kg/m2 Severe obesity (Class II) Increased incidence by 136%  >40 kg/m2 Extreme obesity (Class III) Increased incidence by 254%   BMI Readings from Last 4 Encounters:  03/08/17 35.87 kg/m  01/28/17 35.58 kg/m  01/07/17 35.01 kg/m  12/24/16 35.01 kg/m   Wt Readings from Last 4 Encounters:  03/08/17 250 lb (113.4 kg)  01/28/17 248 lb (112.5 kg)  01/07/17 244 lb (110.7 kg)  12/24/16 244 lb (110.7 kg)

## 2017-03-08 NOTE — Patient Instructions (Addendum)
____________________________________________________________________________________________  Medication Rules  Applies to: All patients receiving prescriptions (written or electronic).  Pharmacy of record: Pharmacy where electronic prescriptions will be sent. If written prescriptions are taken to a different pharmacy, please inform the nursing staff. The pharmacy listed in the electronic medical record should be the one where you would like electronic prescriptions to be sent.  Prescription refills: Only during scheduled appointments. Applies to both, written and electronic prescriptions.  NOTE: The following applies primarily to controlled substances (Opioid* Pain Medications).   Patient's responsibilities: 1. Pain Pills: Bring all pain pills to every appointment (except for procedure appointments). 2. Pill Bottles: Bring pills in original pharmacy bottle. Always bring newest bottle. Bring bottle, even if empty. 3. Medication refills: You are responsible for knowing and keeping track of what medications you need refilled. The day before your appointment, write a list of all prescriptions that need to be refilled. Bring that list to your appointment and give it to the admitting nurse. Prescriptions will be written only during appointments. If you forget a medication, it will not be "Called in", "Faxed", or "electronically sent". You will need to get another appointment to get these prescribed. 4. Prescription Accuracy: You are responsible for carefully inspecting your prescriptions before leaving our office. Have the discharge nurse carefully go over each prescription with you, before taking them home. Make sure that your name is accurately spelled, that your address is correct. Check the name and dose of your medication to make sure it is accurate. Check the number of pills, and the written instructions to make sure they are clear and accurate. Make sure that you are given enough medication to  last until your next medication refill appointment. 5. Taking Medication: Take medication as prescribed. Never take more pills than instructed. Never take medication more frequently than prescribed. Taking less pills or less frequently is permitted and encouraged, when it comes to controlled substances (written prescriptions).  6. Inform other Doctors: Always inform, all of your healthcare providers, of all the medications you take. 7. Pain Medication from other Providers: You are not allowed to accept any additional pain medication from any other Doctor or Healthcare provider. There are two exceptions to this rule. (see below) In the event that you require additional pain medication, you are responsible for notifying us, as stated below. 8. Medication Agreement: You are responsible for carefully reading and following our Medication Agreement. This must be signed before receiving any prescriptions from our practice. Safely store a copy of your signed Agreement. Violations to the Agreement will result in no further prescriptions. (Additional copies of our Medication Agreement are available upon request.) 9. Laws, Rules, & Regulations: All patients are expected to follow all Federal and State Laws, Statutes, Rules, & Regulations. Ignorance of the Laws does not constitute a valid excuse. The use of any illegal substances is prohibited. 10. Adopted CDC guidelines & recommendations: Target dosing levels will be at or below 60 MME/day. Use of benzodiazepines** is not recommended.  Exceptions: There are only two exceptions to the rule of not receiving pain medications from other Healthcare Providers. 1. Exception #1 (Emergencies): In the event of an emergency (i.e.: accident requiring emergency care), you are allowed to receive additional pain medication. However, you are responsible for: As soon as you are able, call our office [phone], at any time of the day or night, and leave a message stating your  name, the date and nature of the emergency, and the name and dose of the medication   prescribed. In the event that your call is answered by a member of our staff, make sure to document and save the date, time, and the name of the person that took your information.  2. Exception #2 (Planned Surgery): In the event that you are scheduled by another doctor or dentist to have any type of surgery or procedure, you are allowed (for a period no longer than 30 days), to receive additional pain medication, for the acute post-op pain. However, in this case, you are responsible for picking up a copy of our "Post-op Pain Management for Surgeons" handout, and giving it to your surgeon or dentist. This document is available at our office, and does not require an appointment to obtain it. Simply go to our office during business hours (Monday-Thursday from 8:00 AM to 4:00 PM) (Friday 8:00 AM to 12:00 Noon) or if you have a scheduled appointment with [place], prior to your surgery, and ask for it by name. In addition, you will need to provide [place] with your name, name of your surgeon, type of surgery, and date of procedure or surgery.  *Opioid medications include: morphine, codeine, oxycodone, oxymorphone, hydrocodone, hydromorphone, meperidine, tramadol, tapentadol, buprenorphine, fentanyl, methadone. **Benzodiazepine medications include: diazepam (Valium), alprazolam (Xanax), clonazepam (Klonopine), lorazepam (Ativan), clorazepate (Tranxene), chlordiazepoxide (Librium), estazolam (Prosom), oxazepam (Serax), temazepam (Restoril), triazolam (Halcion)  ____________________________________________________________________________________________   BMI interpretation table: BMI level Category Range association with higher incidence of chronic pain  <18 kg/m2 Underweight   18.5-24.9 kg/m2 Ideal body weight   25-29.9 kg/m2 Overweight Increased incidence by 20%  30-34.9 kg/m2 Obese (Class I) Increased incidence by 68%  35-39.9 kg/m2  Severe obesity (Class II) Increased incidence by 136%  >40 kg/m2 Extreme obesity (Class III) Increased incidence by 254%   BMI Readings from Last 4 Encounters:  03/08/17 35.87 kg/m  01/28/17 35.58 kg/m  01/07/17 35.01 kg/m  12/24/16 35.01 kg/m   Wt Readings from Last 4 Encounters:  03/08/17 250 lb (113.4 kg)  01/28/17 248 lb (112.5 kg)  01/07/17 244 lb (110.7 kg)  12/24/16 244 lb (110.7 kg)

## 2017-03-08 NOTE — Progress Notes (Signed)
Nursing Pain Medication Assessment:  Safety precautions to be maintained throughout the outpatient stay will include: orient to surroundings, keep bed in low position, maintain call bell within reach at all times, provide assistance with transfer out of bed and ambulation.  Medication Inspection Compliance: Pill count conducted under aseptic conditions, in front of the patient. Neither the pills nor the bottle was removed from the patient's sight at any time. Once count was completed pills were immediately returned to the patient in their original bottle.  Medication: Morphine IR Pill/Patch Count: 15 of 120 pills remain Pill/Patch Appearance: Markings consistent with prescribed medication Bottle Appearance: Standard pharmacy container. Clearly labeled. Filled Date: 11 /21 / 2018 Last Medication intake:  Today

## 2017-04-01 DIAGNOSIS — M47816 Spondylosis without myelopathy or radiculopathy, lumbar region: Secondary | ICD-10-CM | POA: Insufficient documentation

## 2017-04-20 ENCOUNTER — Other Ambulatory Visit: Payer: Self-pay | Admitting: Neurological Surgery

## 2017-04-20 DIAGNOSIS — M4722 Other spondylosis with radiculopathy, cervical region: Secondary | ICD-10-CM

## 2017-05-03 ENCOUNTER — Ambulatory Visit
Admission: RE | Admit: 2017-05-03 | Discharge: 2017-05-03 | Disposition: A | Discharge status: Home / Self Care | Payer: Medicare Other | Source: Ambulatory Visit | Attending: Neurological Surgery | Admitting: Neurological Surgery

## 2017-05-03 DIAGNOSIS — M4722 Other spondylosis with radiculopathy, cervical region: Secondary | ICD-10-CM

## 2017-05-05 DIAGNOSIS — M4712 Other spondylosis with myelopathy, cervical region: Secondary | ICD-10-CM | POA: Insufficient documentation

## 2017-05-11 ENCOUNTER — Other Ambulatory Visit: Payer: Self-pay | Admitting: Neurological Surgery

## 2017-05-12 ENCOUNTER — Telehealth: Payer: Self-pay | Admitting: Cardiovascular Disease

## 2017-05-12 NOTE — Telephone Encounter (Signed)
   [facility] Pre-operative Risk Assessment    Request for surgical clearance:  1. What type of surgery is being performed? c4-5, c5-6, cervical artifical disc replacement  2. When is this surgery scheduled? 05/28/2017  3. What type of clearance is required (medical clearance vs. Pharmacy clearance to hold med vs. Both)? Not listed  4. Are there any medications that need to be held prior to surgery and how long? Not listed  5. Practice name and name of physician performing surgery? [facility] Dr. [name]  6. What is your office phone and fax number? [phone], fax [phone]   7. Anesthesia type (None, local, MAC, general) ? Not listed   Howard Maxwell 05/12/2017, 12:34 PM  _________________________________________________________________   (provider comments below)

## 2017-05-13 NOTE — Telephone Encounter (Signed)
Moderate risk.  No need for a stress test.  If he is on aspirin, he should hold that 5 days before surgery.

## 2017-05-13 NOTE — Telephone Encounter (Signed)
Clearance routed to number provided via Epic fax.

## 2017-05-20 NOTE — Pre-Procedure Instructions (Signed)
Howard Maxwell  05/20/2017      [id number] [address]ina [address]on, [place] [address] [place] [address] Phone: [phone] Fax: [phone]  [place], [place] [address] [place] [address] Phone: [phone] Fax: [phone]    Your procedure is scheduled on May 28, 2017.  Report to [facility] Admitting at 1000 AM.  Call this number if you have problems the morning of surgery:  [phone]   Remember:  Do not eat food or drink liquids after midnight.  Take these medicines the morning of surgery with A SIP OF WATER carvedilol (coreg), eye drops if needed, omeprazole (prilosec), tizanidine (zanaflex), oxycodone-if needed for pain.  7 days prior to surgery STOP taking any Aspirin (unless otherwise instructed by your surgeon), Aleve, Naproxen, Ibuprofen, Motrin, Advil, Goody's, BC's, all herbal medications, fish oil, and all vitamins  Continue all other medications as instructed by your physician except follow the above medication instructions before surgery   WHAT DO I DO ABOUT MY DIABETES MEDICATION?   Marland Kitchen Do not take oral diabetes medicines (pills) the morning of surgery metformin (glucophage) or glipizide (glucatrol) .  How to Manage Your Diabetes Before and After Surgery  Why is it important to control my blood sugar before and after surgery? . Improving blood sugar levels before and after surgery helps healing and can limit problems. . A way of improving blood sugar control is eating a healthy diet by: o  Eating less sugar and carbohydrates o  Increasing activity/exercise o  Talking with your doctor about reaching your blood sugar goals . High blood sugars (greater than 180 mg/dL) can raise your risk of infections and slow your recovery, so you will need to focus on controlling your diabetes during the weeks before surgery. . Make sure that the doctor who takes  care of your diabetes knows about your planned surgery including the date and location.  How do I manage my blood sugar before surgery? . Check your blood sugar at least 4 times a day, starting 2 days before surgery, to make sure that the level is not too high or low. o Check your blood sugar the morning of your surgery when you wake up and every 2 hours until you get to the Short Stay unit. . If your blood sugar is less than 70 mg/dL, you will need to treat for low blood sugar: o Do not take insulin. o Treat a low blood sugar (less than 70 mg/dL) with  cup of clear juice (cranberry or apple), 4 glucose tablets, OR glucose gel. Recheck blood sugar in 15 minutes after treatment (to make sure it is greater than 70 mg/dL). If your blood sugar is not greater than 70 mg/dL on recheck, call [phone] o  for further instructions. . Report your blood sugar to the short stay nurse when you get to Short Stay.  . If you are admitted to the hospital after surgery: o Your blood sugar will be checked by the staff and you will probably be given insulin after surgery (instead of oral diabetes medicines) to make sure you have good blood sugar levels. o The goal for blood sugar control after surgery is 80-180 mg/dL.   Reviewed and Endorsed by [facility] Patient Education Committee, August 2015   Do not wear jewelry.  Do not wear lotions, powders, or colognes, or deodorant.  Men may shave face and neck.  Do not bring valuables to the hospital.  [facility] is not responsible for any belongings or valuables.  Contacts, dentures or bridgework may not be worn into surgery.  Leave your suitcase in the car.  After surgery it may be brought to your room.  For patients admitted to the hospital, discharge time will be determined by your treatment team.  Patients discharged the day of surgery will not be allowed to drive home.   Special instructions:  [facility]- Preparing For Surgery  Before surgery,  you can play an important role. Because skin is not sterile, your skin needs to be as free of germs as possible. You can reduce the number of germs on your skin by washing with CHG (chlorahexidine gluconate) Soap before surgery.  CHG is an antiseptic cleaner which kills germs and bonds with the skin to continue killing germs even after washing.  Please do not use if you have an allergy to CHG or antibacterial soaps. If your skin becomes reddened/irritated stop using the CHG.  Do not shave (including legs and underarms) for at least 48 hours prior to first CHG shower. It is OK to shave your face.  Please follow these instructions carefully.   1. Shower the NIGHT BEFORE SURGERY and the MORNING OF SURGERY with CHG.   2. If you chose to wash your hair, wash your hair first as usual with your normal shampoo.  3. After you shampoo, rinse your hair and body thoroughly to remove the shampoo.  4. Use CHG as you would any other liquid soap. You can apply CHG directly to the skin and wash gently with a scrungie or a clean washcloth.   5. Apply the CHG Soap to your body ONLY FROM THE NECK DOWN.  Do not use on open wounds or open sores. Avoid contact with your eyes, ears, mouth and genitals (private parts). Wash Face and genitals (private parts)  with your normal soap.  6. Wash thoroughly, paying special attention to the area where your surgery will be performed.  7. Thoroughly rinse your body with warm water from the neck down.  8. DO NOT shower/wash with your normal soap after using and rinsing off the CHG Soap.  9. Pat yourself dry with a CLEAN TOWEL.  10. Wear CLEAN PAJAMAS to bed the night before surgery, wear comfortable clothes the morning of surgery  11. Place CLEAN SHEETS on your bed the night of your first shower and DO NOT SLEEP WITH PETS.  Day of Surgery: Do not apply any deodorants/lotions. Please wear clean clothes to the hospital/surgery center.    Please read over the following  fact sheets that you were given. Pain Booklet, Coughing and Deep Breathing, MRSA Information and Surgical Site Infection Prevention

## 2017-05-20 NOTE — Progress Notes (Addendum)
[id number], Cherlyn Labella, MD  Cardiologist: Kathlyn Sacramento, MD  EKG: 12/11/16 in [facility]  Stress test: pt denies past 10 + years  ECHO: pt denies  Cardiac Cath:12/07/13 in [facility]  Chest x-ray: 09/27/16 in [facility]

## 2017-05-21 ENCOUNTER — Encounter (HOSPITAL_COMMUNITY)
Admission: RE | Admit: 2017-05-21 | Discharge: 2017-05-21 | Disposition: A | Discharge status: Home / Self Care | Payer: Medicare Other | Source: Ambulatory Visit | Attending: Neurological Surgery | Admitting: Neurological Surgery

## 2017-05-21 ENCOUNTER — Other Ambulatory Visit: Payer: Self-pay

## 2017-05-21 ENCOUNTER — Encounter (HOSPITAL_COMMUNITY): Payer: Self-pay

## 2017-05-21 DIAGNOSIS — I10 Essential (primary) hypertension: Secondary | ICD-10-CM | POA: Diagnosis not present

## 2017-05-21 DIAGNOSIS — K219 Gastro-esophageal reflux disease without esophagitis: Secondary | ICD-10-CM | POA: Diagnosis not present

## 2017-05-21 DIAGNOSIS — Z01812 Encounter for preprocedural laboratory examination: Secondary | ICD-10-CM | POA: Insufficient documentation

## 2017-05-21 DIAGNOSIS — M4712 Other spondylosis with myelopathy, cervical region: Secondary | ICD-10-CM | POA: Insufficient documentation

## 2017-05-21 DIAGNOSIS — E669 Obesity, unspecified: Secondary | ICD-10-CM | POA: Diagnosis not present

## 2017-05-21 DIAGNOSIS — E119 Type 2 diabetes mellitus without complications: Secondary | ICD-10-CM | POA: Insufficient documentation

## 2017-05-21 DIAGNOSIS — I252 Old myocardial infarction: Secondary | ICD-10-CM | POA: Diagnosis not present

## 2017-05-21 LAB — BASIC METABOLIC PANEL
Anion gap: 12 (ref 5–15)
BUN: 10 mg/dL (ref 6–20)
CO2: 26 mmol/L (ref 22–32)
Calcium: 8.8 mg/dL — ABNORMAL LOW (ref 8.9–10.3)
Chloride: 96 mmol/L — ABNORMAL LOW (ref 101–111)
Creatinine, Ser: 1.17 mg/dL (ref 0.61–1.24)
GFR calc Af Amer: >60 mL/min (ref >60)
GFR calc non Af Amer: >60 mL/min (ref >60)
Glucose, Bld: 238 mg/dL — ABNORMAL HIGH (ref 65–99)
Potassium: 4.2 mmol/L (ref 3.5–5.1)
Sodium: 134 mmol/L — ABNORMAL LOW (ref 135–145)

## 2017-05-21 LAB — CBC
HCT: 39.7 % (ref 39.0–52.0)
Hemoglobin: 12.8 g/dL — ABNORMAL LOW (ref 13.0–17.0)
MCH: 29.9 pg (ref 26.0–34.0)
MCHC: 32.2 g/dL (ref 30.0–36.0)
MCV: 92.8 fL (ref 78.0–100.0)
Platelets: 220 10*3/uL (ref 150–400)
RBC: 4.28 MIL/uL (ref 4.22–5.81)
RDW: 14 % (ref 11.5–15.5)
WBC: 9.2 10*3/uL (ref 4.0–10.5)

## 2017-05-21 LAB — TYPE AND SCREEN
ABO/RH(D): O POS
Antibody Screen: NEGATIVE

## 2017-05-21 LAB — HEMOGLOBIN A1C
Hgb A1c MFr Bld: 8.3 % — ABNORMAL HIGH (ref 4.8–5.6)
Mean Plasma Glucose: 191.51 mg/dL

## 2017-05-21 LAB — SURGICAL PCR SCREEN
MRSA, PCR: NEGATIVE
Staphylococcus aureus: POSITIVE — AB

## 2017-05-21 LAB — GLUCOSE, CAPILLARY: Glucose-Capillary: 254 mg/dL — ABNORMAL HIGH (ref 65–99)

## 2017-05-21 LAB — ABO/RH: ABO/RH(D): O POS

## 2017-05-21 NOTE — Progress Notes (Signed)
Mupirocin Ointment [facility] [address] in [place] for positive PCR of Staph. Pt notified and voiced understanding.

## 2017-05-24 NOTE — Progress Notes (Signed)
Anesthesia Chart Review: Patient is a 72 year old male scheduled for C4-5, C5-6 artifical disc replacement on 05/28/17 by Dr. [name].  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   - PCP is Dr. Tracie Harrier with [facility] ([place]). Last visit 03/03/17.  - Cardiologist is Dr.  Kathlyn [place]. Last visit 12/11/16. In regards to upcoming surgery, on 05/13/17, he wrote, "Moderate risk.  No need for a stress test.  If he is on aspirin, he should hold that 5 days before surgery." - Interventional Pain Specialist is Dr. Milinda Pointer.   - GI is Dr. Keith Rake at [facility] ([place]). - Ophthalmologist is Dr. Norlene Duel ([place]).  Meds Lipitor, Coreg, ferrous sulfate, glipizide, Krill oil, losartan-HCTZ, magnesium oxide, metformin, naphazoline ophthalmic, Prilosec, Oxy IR, potassium, Aldactone, Zanaflex.   BP (!) 155/71   Pulse 69   Temp 36.8 C   Resp 20   Ht 5\' 10"  (1.778 m)   Wt 261 lb 14.4 oz (118.8 kg)   SpO2 97%   BMI 37.58 kg/m   EKG 12/11/16: SB at 57 bpm, first degree AV block, inferior infarct (age undetermined).  Cardiac cath 12/07/13 [facility]):   -Hemodynamics: Hemodynamic assessment demonstrates borderline systemic hypertension and mildly elevated LVEDP. -Coronary circulation: , there was severe 1 vessel CAD.  RCA is occluded with faint collaterals.  The vessel is medium in size and codominant. 30% mid LAD. DIAG1 (very small), DIAG2, CX, OM1, OM2, OM3, LPLA1, LPLA2 all with only minor luminal irregularities.  - Cardiac structures: Global left ventricular function was normal.  EF estimated  at 55%. - Recommendations: Patient management should include aggressive medical therapy and cardiac rehabilitation program.  CXR 09/27/16: IMPRESSION: Stable exam. Bilateral pleural thickening and hyperexpansion. No acute cardiopulmonary findings.  MRI c-spine 05/03/17: IMPRESSION: - Worsened facet arthropathy on the left at C4-5 since the prior MRI with new marrow edema and cystic change in the facets. Mild deformity of the ventral cord at C4-5 and moderate to moderately severe bilateral foraminal narrowing, worse on the left, do not appear markedly changed. - Status post C3-4 fusion. The central canal is open. Moderate to moderately severe foraminal narrowing is worse on the left. - Status post C6-7 fusion. No bridging bone is seen across the disc interspace raise the possibility of pseudoarthrosis. This could be better evaluated with CT scan. Moderately severe to severe foraminal narrowing at this level is worse on the left and unchanged. The central canal is open. - Deformity of the ventral cord and moderately severe to severe left foraminal narrowing at C5-6. - Moderate to moderately severe bilateral foraminal narrowing C7-T1.  Preoperative labs noted. Cr 1.17, Na 134, Cl 96 (range 96-101 since 04/27/16), Ca 8.8, glucose 238, A1c 8.3, H/H 12.8/39.7, PLT 220. T&S done.   Patient has cardiac clearance for surgery (moderate risk). Patient will get a fasting CBG on the day of surgery. If results acceptable and otherwise no acute changes then I anticipate that he can proceed as planned.  [facility] Short Stay Center/Anesthesiology Phone [phone] 05/24/2017 12:45 PM

## 2017-05-26 ENCOUNTER — Encounter: Payer: Self-pay | Admitting: Nurse Practitioner

## 2017-05-26 ENCOUNTER — Other Ambulatory Visit: Payer: Self-pay

## 2017-05-26 ENCOUNTER — Ambulatory Visit: Payer: Medicare Other | Attending: Nurse Practitioner | Admitting: Nurse Practitioner

## 2017-05-26 VITALS — BP 142/71 | HR 62 | Temp 98.1°F | Resp 18 | Ht 70.0 in | Wt 250.0 lb

## 2017-05-26 DIAGNOSIS — I2510 Atherosclerotic heart disease of native coronary artery without angina pectoris: Secondary | ICD-10-CM | POA: Insufficient documentation

## 2017-05-26 DIAGNOSIS — E669 Obesity, unspecified: Secondary | ICD-10-CM | POA: Insufficient documentation

## 2017-05-26 DIAGNOSIS — M542 Cervicalgia: Secondary | ICD-10-CM

## 2017-05-26 DIAGNOSIS — Z9049 Acquired absence of other specified parts of digestive tract: Secondary | ICD-10-CM | POA: Insufficient documentation

## 2017-05-26 DIAGNOSIS — Z79899 Other long term (current) drug therapy: Secondary | ICD-10-CM | POA: Diagnosis not present

## 2017-05-26 DIAGNOSIS — Z882 Allergy status to sulfonamides status: Secondary | ICD-10-CM | POA: Insufficient documentation

## 2017-05-26 DIAGNOSIS — Z7984 Long term (current) use of oral hypoglycemic drugs: Secondary | ICD-10-CM | POA: Insufficient documentation

## 2017-05-26 DIAGNOSIS — M48061 Spinal stenosis, lumbar region without neurogenic claudication: Secondary | ICD-10-CM | POA: Insufficient documentation

## 2017-05-26 DIAGNOSIS — M79602 Pain in left arm: Secondary | ICD-10-CM | POA: Diagnosis not present

## 2017-05-26 DIAGNOSIS — E785 Hyperlipidemia, unspecified: Secondary | ICD-10-CM | POA: Insufficient documentation

## 2017-05-26 DIAGNOSIS — Z9889 Other specified postprocedural states: Secondary | ICD-10-CM | POA: Diagnosis not present

## 2017-05-26 DIAGNOSIS — M5116 Intervertebral disc disorders with radiculopathy, lumbar region: Secondary | ICD-10-CM | POA: Insufficient documentation

## 2017-05-26 DIAGNOSIS — G4730 Sleep apnea, unspecified: Secondary | ICD-10-CM | POA: Insufficient documentation

## 2017-05-26 DIAGNOSIS — Z6835 Body mass index (BMI) 35.0-35.9, adult: Secondary | ICD-10-CM | POA: Diagnosis not present

## 2017-05-26 DIAGNOSIS — D649 Anemia, unspecified: Secondary | ICD-10-CM | POA: Insufficient documentation

## 2017-05-26 DIAGNOSIS — Z981 Arthrodesis status: Secondary | ICD-10-CM | POA: Diagnosis not present

## 2017-05-26 DIAGNOSIS — G8929 Other chronic pain: Secondary | ICD-10-CM

## 2017-05-26 DIAGNOSIS — J449 Chronic obstructive pulmonary disease, unspecified: Secondary | ICD-10-CM | POA: Diagnosis not present

## 2017-05-26 DIAGNOSIS — M7918 Myalgia, other site: Secondary | ICD-10-CM | POA: Insufficient documentation

## 2017-05-26 DIAGNOSIS — E1140 Type 2 diabetes mellitus with diabetic neuropathy, unspecified: Secondary | ICD-10-CM | POA: Insufficient documentation

## 2017-05-26 DIAGNOSIS — Z888 Allergy status to other drugs, medicaments and biological substances status: Secondary | ICD-10-CM | POA: Insufficient documentation

## 2017-05-26 DIAGNOSIS — M5136 Other intervertebral disc degeneration, lumbar region: Secondary | ICD-10-CM | POA: Insufficient documentation

## 2017-05-26 DIAGNOSIS — G894 Chronic pain syndrome: Secondary | ICD-10-CM | POA: Diagnosis not present

## 2017-05-26 DIAGNOSIS — M4726 Other spondylosis with radiculopathy, lumbar region: Secondary | ICD-10-CM | POA: Diagnosis not present

## 2017-05-26 DIAGNOSIS — M79601 Pain in right arm: Secondary | ICD-10-CM | POA: Diagnosis not present

## 2017-05-26 DIAGNOSIS — Z885 Allergy status to narcotic agent status: Secondary | ICD-10-CM | POA: Insufficient documentation

## 2017-05-26 DIAGNOSIS — N529 Male erectile dysfunction, unspecified: Secondary | ICD-10-CM | POA: Diagnosis not present

## 2017-05-26 DIAGNOSIS — I252 Old myocardial infarction: Secondary | ICD-10-CM | POA: Diagnosis not present

## 2017-05-26 DIAGNOSIS — Z87891 Personal history of nicotine dependence: Secondary | ICD-10-CM | POA: Diagnosis not present

## 2017-05-26 DIAGNOSIS — K219 Gastro-esophageal reflux disease without esophagitis: Secondary | ICD-10-CM | POA: Insufficient documentation

## 2017-05-26 DIAGNOSIS — M4802 Spinal stenosis, cervical region: Secondary | ICD-10-CM | POA: Insufficient documentation

## 2017-05-26 DIAGNOSIS — I10 Essential (primary) hypertension: Secondary | ICD-10-CM | POA: Insufficient documentation

## 2017-05-26 DIAGNOSIS — H547 Unspecified visual loss: Secondary | ICD-10-CM | POA: Insufficient documentation

## 2017-05-26 DIAGNOSIS — M160 Bilateral primary osteoarthritis of hip: Secondary | ICD-10-CM | POA: Insufficient documentation

## 2017-05-26 DIAGNOSIS — E559 Vitamin D deficiency, unspecified: Secondary | ICD-10-CM | POA: Insufficient documentation

## 2017-05-26 DIAGNOSIS — Z79891 Long term (current) use of opiate analgesic: Secondary | ICD-10-CM | POA: Insufficient documentation

## 2017-05-26 MED ORDER — OXYCODONE HCL 5 MG PO TABS: 5.0000 mg | ORAL_TABLET | Freq: Four times a day (QID) | ORAL | 0 refills | Status: DC | PRN

## 2017-05-26 MED ORDER — OXYCODONE HCL 5 MG PO TABS
5.0000 mg | ORAL_TABLET | Freq: Four times a day (QID) | ORAL | 0 refills | Status: DC | PRN
Start: 2017-06-10 — End: 2017-06-22

## 2017-05-26 MED ORDER — MAGNESIUM OXIDE -MG SUPPLEMENT 500 MG PO CAPS: 1.0000 | ORAL_CAPSULE | Freq: Two times a day (BID) | ORAL | 0 refills | Status: DC

## 2017-05-26 NOTE — Progress Notes (Signed)
Nursing Pain Medication Assessment:  Safety precautions to be maintained throughout the outpatient stay will include: orient to surroundings, keep bed in low position, maintain call bell within reach at all times, provide assistance with transfer out of bed and ambulation.  Medication Inspection Compliance: Pill count conducted under aseptic conditions, in front of the patient. Neither the pills nor the bottle was removed from the patient's sight at any time. Once count was completed pills were immediately returned to the patient in their original bottle.  Medication: Oxycodone IR Pill/Patch Count: 35 of 120 pills remain Pill/Patch Appearance: Markings consistent with prescribed medication Bottle Appearance: Standard pharmacy container. Clearly labeled. Filled Date: 02 / 19 / 2019 Last Medication intake:  Today

## 2017-05-26 NOTE — Progress Notes (Signed)
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 is closed on scheduled refill date. Do not fill until: 08/09/2017 To last until:09/08/2017    Order Specific Question:   Supervising Provider    Answer:   Milinda Pointer [phone]  . oxyCODONE (OXY IR/ROXICODONE) 5 MG immediate release tablet    Sig: Take 1 tablet (5 mg total) by mouth every 6 (six) hours as needed for severe pain.    Dispense:  120 tablet    Refill:  0    Do not place this medication, or any other prescription from our practice, on "Automatic Refill". Patient may have prescription filled one day early if pharmacy is closed on scheduled refill date. Do not fill until: 07/10/2017 To last until: 08/09/2017    Order Specific Question:   Supervising Provider    Answer:   Milinda Pointer [phone]  . oxyCODONE (OXY IR/ROXICODONE) 5 MG immediate release tablet    Sig: Take 1 tablet (5 mg total) by mouth every  6 (six) hours as needed for severe pain.    Dispense:  120 tablet    Refill:  0    Do not place this medication, or any other prescription from our practice, on "Automatic Refill". Patient may have prescription filled one day early if pharmacy is closed on scheduled refill date. Do not fill until: 06/10/2017 To last until: 07/10/2017    Order Specific Question:   Supervising Provider    Answer:   Milinda Pointer [phone]  . Magnesium Oxide 500 MG CAPS    Sig: Take 1 capsule (500 mg total) by mouth 2 (two) times daily at 8 am and 10 pm.    Dispense:  180 capsule    Refill:  0    Do not place medication on "Automatic Refill". Fill one day early if pharmacy is closed on scheduled refill date.    Order Specific Question:   Supervising Provider    Answer:   Milinda Pointer [[id number]]   New Prescriptions   No medications on file   Medications administered today: Christo Hain. Santana had no medications administered during this visit. Lab-work, procedure(s), and/or referral(s): No orders of the defined types were placed in this encounter.  Imaging and/or referral(s): None  Interventional management options: Planned, scheduled, and/or pending: Not at this time.   Considering: Diagnostic bilateral lumbar facetblock  Possible bilateral lumbar facet RFA Diagnostic L3-4 interspinous ligament block Possible L3-4 bilateral medial branch RFA Diagnostic right sided L2-3 and L3-4 lumbar epiduralsteroid injection  Diagnostic bilateral L1-2 &L4-5 transforaminalepidural steroid injection  Diagnostic bilateral L2-3 transforaminal epidural steroid injection Diagnostic caudal epidural steroid injection + diagnostic epidurogram Possible Racz procedure Diagnostic bilateral intra-articular hipjoint injection  Diagnostic bilateral femoral nerve and obturator nerveblock  Possible bilateral femoral nerve and obturator nerve RFA Diagnostic left cervical epidural  steroid injection   Diagnostic bilateral cervical facet block Possible bilateral cervical facet RFA Possible series of 5 left intra-articular Hyalganknee injection  Diagnostic left Genicular nerve block Possible left Genicular nerve radiofrequencyablation  Possible intrathecal opioid trial   Palliative PRN treatment(s): None at this time   Provider-requested follow-up: Return in about 3 months (around 08/26/2017) for MedMgmt with Me Donella Stade [facility]).  Future Appointments  Date Time Provider [place]  07/06/2017  2:40 PM Wellington Hampshire, MD [facility]  08/26/2017  9:00 AM Vevelyn Francois, NP [facility] None   Primary Care Physician: Tracie Harrier, MD Location: [facility] Note by: Vevelyn Francois NP Date: 05/26/2017; Time: 10:04 AM  Pain Score Disclaimer: We use the NRS-11 scale. This is a self-reported, subjective measurement of pain severity with only modest accuracy. It is used primarily to identify changes within a particular patient. It must be understood that outpatient pain scales are significantly less accurate that those used for research, where they can be applied under ideal controlled circumstances with minimal exposure to variables. In reality, the score is likely to be a combination of pain intensity and pain affect, where pain affect describes the degree of emotional arousal or changes in action readiness caused by the sensory experience of pain. Factors such as social and work situation, setting, emotional state, anxiety levels, expectation, and prior pain experience may influence pain perception and show large inter-individual differences that may also be affected by time variables.  Patient instructions provided during this appointment: Patient Instructions   ____________________________________________________________________________________________  Medication Rules  Applies to: All patients receiving prescriptions (written or  electronic).  Pharmacy of record: Pharmacy where electronic prescriptions will be sent. If written prescriptions are taken to a different pharmacy, please inform the nursing staff. The pharmacy listed in the electronic medical record should be the one where you would like electronic prescriptions to be sent.  Prescription refills: Only during scheduled appointments. Applies to both, written and electronic prescriptions.  NOTE: The following applies primarily to controlled substances (Opioid* Pain Medications).   Patient's responsibilities: 1. Pain Pills: Bring all pain pills to every appointment (except for procedure appointments). 2. Pill Bottles: Bring pills in original pharmacy bottle. Always bring newest bottle. Bring bottle, even if empty. 3. Medication refills: You are responsible for knowing and keeping track of what medications you need refilled. The day before your appointment, write a list of all prescriptions that need to be refilled. Bring that list to your appointment and give it to the admitting nurse. Prescriptions will be written only during appointments. If you forget a medication, it will not be "Called in", "Faxed", or "electronically sent". You will need to get another appointment to get these prescribed. 4. Prescription Accuracy: You are responsible for carefully inspecting your prescriptions before leaving our office. Have the discharge nurse carefully go over each prescription with you, before taking them home. Make sure that your name is accurately spelled, that your address is correct. Check the name and dose of your medication to make sure it is accurate. Check the number of pills, and the written instructions to make sure they are clear and accurate. Make sure that you are given enough medication to last until your next medication refill appointment. 5. Taking Medication: Take medication as prescribed. Never take more pills than instructed. Never take medication more  frequently than prescribed. Taking less pills or less frequently is permitted and encouraged, when it comes to controlled substances (written prescriptions).  6. Inform  other Doctors: Always inform, all of your healthcare providers, of all the medications you take. 7. Pain Medication from other Providers: You are not allowed to accept any additional pain medication from any other Doctor or Healthcare provider. There are two exceptions to this rule. (see below) In the event that you require additional pain medication, you are responsible for notifying us, as stated below. 8. Medication Agreement: You are responsible for carefully reading and following our Medication Agreement. This must be signed before receiving any prescriptions from our practice. Safely store a copy of your signed Agreement. Violations to the Agreement will result in no further prescriptions. (Additional copies of our Medication Agreement are available upon request.) 9. Laws, Rules, & Regulations: All patients are expected to follow all Federal and [employer], Rules, [employer]. Ignorance of the Laws does not constitute a valid excuse. The use of any illegal substances is prohibited. 10. Adopted CDC guidelines & recommendations: Target dosing levels will be at or below 60 MME/day. Use of benzodiazepines** is not recommended.  Exceptions: There are only two exceptions to the rule of not receiving pain medications from other Healthcare Providers. 1. Exception #1 (Emergencies): In the event of an emergency (i.e.: accident requiring emergency care), you are allowed to receive additional pain medication. However, you are responsible for: As soon as you are able, call our office [phone], at any time of the day or night, and leave a message stating your name, the date and nature of the emergency, and the name and dose of the medication prescribed. In the event that your call is answered by a member of our staff, make sure to  document and save the date, time, and the name of the person that took your information.  2. Exception #2 (Planned Surgery): In the event that you are scheduled by another doctor or dentist to have any type of surgery or procedure, you are allowed (for a period no longer than 30 days), to receive additional pain medication, for the acute post-op pain. However, in this case, you are responsible for picking up a copy of our "Post-op Pain Management for Surgeons" handout, and giving it to your surgeon or dentist. This document is available at our office, and does not require an appointment to obtain it. Simply go to our office during business hours (Monday-Thursday from 8:00 AM to 4:00 PM) (Friday 8:00 AM to 12:00 Noon) or if you have a scheduled appointment with [place], prior to your surgery, and ask for it by name. In addition, you will need to provide [place] with your name, name of your surgeon, type of surgery, and date of procedure or surgery.  *Opioid medications include: morphine, codeine, oxycodone, oxymorphone, hydrocodone, hydromorphone, meperidine, tramadol, tapentadol, buprenorphine, fentanyl, methadone. **Benzodiazepine medications include: diazepam (Valium), alprazolam (Xanax), clonazepam (Klonopine), lorazepam (Ativan), clorazepate (Tranxene), chlordiazepoxide (Librium), estazolam (Prosom), oxazepam (Serax), temazepam (Restoril), triazolam (Halcion) (Last updated: 05/20/2017) ____________________________________________________________________________________________    BMI Assessment: Estimated body mass index is 35.87 kg/m as calculated from the following:   Height as of this encounter: 5' 10"  (1.778 m).   Weight as of this encounter: 250 lb (113.4 kg).  BMI interpretation table: BMI level Category Range association with higher incidence of chronic pain  <18 kg/m2 Underweight   18.5-24.9 kg/m2 Ideal body weight   25-29.9 kg/m2 Overweight Increased incidence by 20%  30-34.9 kg/m2 Obese  (Class I) Increased incidence by 68%  35-39.9 kg/m2 Severe obesity (Class II) Increased incidence by 136%  >40 kg/m2 Extreme obesity (  Class III) Increased incidence by 254%   BMI Readings from Last 4 Encounters:  05/26/17 35.87 kg/m  05/21/17 37.58 kg/m  03/08/17 35.87 kg/m  01/28/17 35.58 kg/m   Wt Readings from Last 4 Encounters:  05/26/17 250 lb (113.4 kg)  05/21/17 261 lb 14.4 oz (118.8 kg)  03/08/17 250 lb (113.4 kg)  01/28/17 248 lb (112.5 kg)   You were given 3 prescriptions for Oxycodone today. A prescription for Magnesium was sent to your pharmacy.

## 2017-05-26 NOTE — Patient Instructions (Addendum)
____________________________________________________________________________________________  Medication Rules  Applies to: All patients receiving prescriptions (written or electronic).  Pharmacy of record: Pharmacy where electronic prescriptions will be sent. If written prescriptions are taken to a different pharmacy, please inform the nursing staff. The pharmacy listed in the electronic medical record should be the one where you would like electronic prescriptions to be sent.  Prescription refills: Only during scheduled appointments. Applies to both, written and electronic prescriptions.  NOTE: The following applies primarily to controlled substances (Opioid* Pain Medications).   Patient's responsibilities: 1. Pain Pills: Bring all pain pills to every appointment (except for procedure appointments). 2. Pill Bottles: Bring pills in original pharmacy bottle. Always bring newest bottle. Bring bottle, even if empty. 3. Medication refills: You are responsible for knowing and keeping track of what medications you need refilled. The day before your appointment, write a list of all prescriptions that need to be refilled. Bring that list to your appointment and give it to the admitting nurse. Prescriptions will be written only during appointments. If you forget a medication, it will not be "Called in", "Faxed", or "electronically sent". You will need to get another appointment to get these prescribed. 4. Prescription Accuracy: You are responsible for carefully inspecting your prescriptions before leaving our office. Have the discharge nurse carefully go over each prescription with you, before taking them home. Make sure that your name is accurately spelled, that your address is correct. Check the name and dose of your medication to make sure it is accurate. Check the number of pills, and the written instructions to make sure they are clear and accurate. Make sure that you are given enough medication to last  until your next medication refill appointment. 5. Taking Medication: Take medication as prescribed. Never take more pills than instructed. Never take medication more frequently than prescribed. Taking less pills or less frequently is permitted and encouraged, when it comes to controlled substances (written prescriptions).  6. Inform other Doctors: Always inform, all of your healthcare providers, of all the medications you take. 7. Pain Medication from other Providers: You are not allowed to accept any additional pain medication from any other Doctor or Healthcare provider. There are two exceptions to this rule. (see below) In the event that you require additional pain medication, you are responsible for notifying us, as stated below. 8. Medication Agreement: You are responsible for carefully reading and following our Medication Agreement. This must be signed before receiving any prescriptions from our practice. Safely store a copy of your signed Agreement. Violations to the Agreement will result in no further prescriptions. (Additional copies of our Medication Agreement are available upon request.) 9. Laws, Rules, & Regulations: All patients are expected to follow all Federal and State Laws, Statutes, Rules, & Regulations. Ignorance of the Laws does not constitute a valid excuse. The use of any illegal substances is prohibited. 10. Adopted CDC guidelines & recommendations: Target dosing levels will be at or below 60 MME/day. Use of benzodiazepines** is not recommended.  Exceptions: There are only two exceptions to the rule of not receiving pain medications from other Healthcare Providers. 1. Exception #1 (Emergencies): In the event of an emergency (i.e.: accident requiring emergency care), you are allowed to receive additional pain medication. However, you are responsible for: As soon as you are able, call our office [phone], at any time of the day or night, and leave a message stating your name, the  date and nature of the emergency, and the name and dose of the medication   prescribed. In the event that your call is answered by a member of our staff, make sure to document and save the date, time, and the name of the person that took your information.  2. Exception #2 (Planned Surgery): In the event that you are scheduled by another doctor or dentist to have any type of surgery or procedure, you are allowed (for a period no longer than 30 days), to receive additional pain medication, for the acute post-op pain. However, in this case, you are responsible for picking up a copy of our "Post-op Pain Management for Surgeons" handout, and giving it to your surgeon or dentist. This document is available at our office, and does not require an appointment to obtain it. Simply go to our office during business hours (Monday-Thursday from 8:00 AM to 4:00 PM) (Friday 8:00 AM to 12:00 Noon) or if you have a scheduled appointment with [place], prior to your surgery, and ask for it by name. In addition, you will need to provide [place] with your name, name of your surgeon, type of surgery, and date of procedure or surgery.  *Opioid medications include: morphine, codeine, oxycodone, oxymorphone, hydrocodone, hydromorphone, meperidine, tramadol, tapentadol, buprenorphine, fentanyl, methadone. **Benzodiazepine medications include: diazepam (Valium), alprazolam (Xanax), clonazepam (Klonopine), lorazepam (Ativan), clorazepate (Tranxene), chlordiazepoxide (Librium), estazolam (Prosom), oxazepam (Serax), temazepam (Restoril), triazolam (Halcion) (Last updated: 05/20/2017) ____________________________________________________________________________________________    BMI Assessment: Estimated body mass index is 35.87 kg/m as calculated from the following:   Height as of this encounter: 5\' 10"  (1.778 m).   Weight as of this encounter: 250 lb (113.4 kg).  BMI interpretation table: BMI level Category Range association with higher  incidence of chronic pain  <18 kg/m2 Underweight   18.5-24.9 kg/m2 Ideal body weight   25-29.9 kg/m2 Overweight Increased incidence by 20%  30-34.9 kg/m2 Obese (Class I) Increased incidence by 68%  35-39.9 kg/m2 Severe obesity (Class II) Increased incidence by 136%  >40 kg/m2 Extreme obesity (Class III) Increased incidence by 254%   BMI Readings from Last 4 Encounters:  05/26/17 35.87 kg/m  05/21/17 37.58 kg/m  03/08/17 35.87 kg/m  01/28/17 35.58 kg/m   Wt Readings from Last 4 Encounters:  05/26/17 250 lb (113.4 kg)  05/21/17 261 lb 14.4 oz (118.8 kg)  03/08/17 250 lb (113.4 kg)  01/28/17 248 lb (112.5 kg)   You were given 3 prescriptions for Oxycodone today. A prescription for Magnesium was sent to your pharmacy.

## 2017-05-28 ENCOUNTER — Encounter (HOSPITAL_COMMUNITY): Payer: Self-pay

## 2017-05-28 ENCOUNTER — Other Ambulatory Visit: Payer: Self-pay

## 2017-05-28 ENCOUNTER — Ambulatory Visit (HOSPITAL_COMMUNITY): Payer: Medicare Other | Admitting: Vascular Surgery

## 2017-05-28 ENCOUNTER — Ambulatory Visit (HOSPITAL_COMMUNITY): Payer: Medicare Other

## 2017-05-28 ENCOUNTER — Observation Stay (HOSPITAL_COMMUNITY)
Admission: RE | Admit: 2017-05-28 | Discharge: 2017-05-28 | Disposition: A | Discharge status: Home / Self Care | Payer: Medicare Other | Source: Ambulatory Visit | Attending: Neurological Surgery | Admitting: Neurological Surgery

## 2017-05-28 ENCOUNTER — Ambulatory Visit (HOSPITAL_COMMUNITY)
Admission: RE | Disposition: A | Discharge status: Home / Self Care | Payer: Self-pay | Source: Ambulatory Visit | Attending: Neurological Surgery

## 2017-05-28 ENCOUNTER — Ambulatory Visit (HOSPITAL_COMMUNITY): Payer: Medicare Other | Admitting: Certified Registered Nurse Anesthetist

## 2017-05-28 DIAGNOSIS — G4730 Sleep apnea, unspecified: Secondary | ICD-10-CM | POA: Diagnosis not present

## 2017-05-28 DIAGNOSIS — Z79899 Other long term (current) drug therapy: Secondary | ICD-10-CM | POA: Diagnosis not present

## 2017-05-28 DIAGNOSIS — M5412 Radiculopathy, cervical region: Secondary | ICD-10-CM | POA: Diagnosis present

## 2017-05-28 DIAGNOSIS — Z6835 Body mass index (BMI) 35.0-35.9, adult: Secondary | ICD-10-CM | POA: Diagnosis not present

## 2017-05-28 DIAGNOSIS — J449 Chronic obstructive pulmonary disease, unspecified: Secondary | ICD-10-CM | POA: Insufficient documentation

## 2017-05-28 DIAGNOSIS — M4712 Other spondylosis with myelopathy, cervical region: Secondary | ICD-10-CM | POA: Diagnosis not present

## 2017-05-28 DIAGNOSIS — E119 Type 2 diabetes mellitus without complications: Secondary | ICD-10-CM | POA: Diagnosis not present

## 2017-05-28 DIAGNOSIS — I2510 Atherosclerotic heart disease of native coronary artery without angina pectoris: Secondary | ICD-10-CM | POA: Diagnosis not present

## 2017-05-28 DIAGNOSIS — I252 Old myocardial infarction: Secondary | ICD-10-CM | POA: Diagnosis not present

## 2017-05-28 DIAGNOSIS — Z7984 Long term (current) use of oral hypoglycemic drugs: Secondary | ICD-10-CM | POA: Diagnosis not present

## 2017-05-28 DIAGNOSIS — Z87891 Personal history of nicotine dependence: Secondary | ICD-10-CM | POA: Insufficient documentation

## 2017-05-28 DIAGNOSIS — M4722 Other spondylosis with radiculopathy, cervical region: Secondary | ICD-10-CM | POA: Insufficient documentation

## 2017-05-28 DIAGNOSIS — I10 Essential (primary) hypertension: Secondary | ICD-10-CM | POA: Diagnosis not present

## 2017-05-28 DIAGNOSIS — E669 Obesity, unspecified: Secondary | ICD-10-CM | POA: Diagnosis not present

## 2017-05-28 DIAGNOSIS — K219 Gastro-esophageal reflux disease without esophagitis: Secondary | ICD-10-CM | POA: Insufficient documentation

## 2017-05-28 DIAGNOSIS — Z419 Encounter for procedure for purposes other than remedying health state, unspecified: Secondary | ICD-10-CM

## 2017-05-28 HISTORY — DX: Other spondylosis with myelopathy, cervical region: M47.12

## 2017-05-28 HISTORY — PX: CERVICAL DISC ARTHROPLASTY: SHX587

## 2017-05-28 LAB — GLUCOSE, CAPILLARY
Glucose-Capillary: 172 mg/dL — ABNORMAL HIGH (ref 65–99)
Glucose-Capillary: 152 mg/dL — ABNORMAL HIGH (ref 65–99)
Glucose-Capillary: 162 mg/dL — ABNORMAL HIGH (ref 65–99)

## 2017-05-28 SURGERY — CERVICAL ANTERIOR DISC ARTHROPLASTY
Anesthesia: General

## 2017-05-28 MED ORDER — PANTOPRAZOLE SODIUM 40 MG PO TBEC: 40.0000 mg | DELAYED_RELEASE_TABLET | Freq: Every day | ORAL | Status: DC

## 2017-05-28 MED ORDER — CHLORHEXIDINE GLUCONATE CLOTH 2 % EX PADS: 6.0000 | MEDICATED_PAD | Freq: Once | CUTANEOUS | Status: DC

## 2017-05-28 MED ORDER — SUGAMMADEX SODIUM 200 MG/2ML IV SOLN
INTRAVENOUS | Status: AC
  Filled 2017-05-28: qty 2

## 2017-05-28 MED ORDER — SODIUM CHLORIDE 0.9% FLUSH: 3.0000 mL | INTRAVENOUS | Status: DC | PRN

## 2017-05-28 MED ORDER — FENTANYL CITRATE (PF) 100 MCG/2ML IJ SOLN
INTRAMUSCULAR | Status: DC | PRN
  Administered 2017-05-28: 50 ug via INTRAVENOUS
  Administered 2017-05-28: 100 ug via INTRAVENOUS
  Administered 2017-05-28 (×3): 50 ug via INTRAVENOUS

## 2017-05-28 MED ORDER — GLIPIZIDE ER 5 MG PO TB24
5.0000 mg | ORAL_TABLET | Freq: Every day | ORAL | Status: DC
  Filled 2017-05-28: qty 1

## 2017-05-28 MED ORDER — PHENOL 1.4 % MT LIQD: 1.0000 | OROMUCOSAL | Status: DC | PRN

## 2017-05-28 MED ORDER — BISACODYL 10 MG RE SUPP: 10.0000 mg | Freq: Every day | RECTAL | Status: DC | PRN

## 2017-05-28 MED ORDER — PROPOFOL 10 MG/ML IV BOLUS
INTRAVENOUS | Status: AC
  Filled 2017-05-28: qty 20

## 2017-05-28 MED ORDER — FENTANYL CITRATE (PF) 100 MCG/2ML IJ SOLN
INTRAMUSCULAR | Status: AC
  Filled 2017-05-28: qty 2

## 2017-05-28 MED ORDER — LIDOCAINE HCL (CARDIAC) 20 MG/ML IV SOLN
INTRAVENOUS | Status: DC | PRN
  Administered 2017-05-28: 70 mg via INTRAVENOUS

## 2017-05-28 MED ORDER — DIAZEPAM 5 MG PO TABS: 5.0000 mg | ORAL_TABLET | Freq: Four times a day (QID) | ORAL | 0 refills | Status: DC | PRN

## 2017-05-28 MED ORDER — ONDANSETRON HCL 4 MG/2ML IJ SOLN
INTRAMUSCULAR | Status: AC
  Filled 2017-05-28: qty 2

## 2017-05-28 MED ORDER — OXYCODONE HCL 5 MG/5ML PO SOLN: 5.0000 mg | Freq: Once | ORAL | Status: DC | PRN

## 2017-05-28 MED ORDER — EPHEDRINE 5 MG/ML INJ
INTRAVENOUS | Status: AC
  Filled 2017-05-28: qty 10

## 2017-05-28 MED ORDER — METFORMIN HCL 850 MG PO TABS
850.0000 mg | ORAL_TABLET | Freq: Two times a day (BID) | ORAL | Status: DC
  Filled 2017-05-28: qty 1

## 2017-05-28 MED ORDER — DOCUSATE SODIUM 100 MG PO CAPS: 100.0000 mg | ORAL_CAPSULE | Freq: Two times a day (BID) | ORAL | Status: DC

## 2017-05-28 MED ORDER — POTASSIUM 99 MG PO TABS: 99.0000 mg | ORAL_TABLET | Freq: Every day | ORAL | Status: DC

## 2017-05-28 MED ORDER — ROCURONIUM BROMIDE 100 MG/10ML IV SOLN
INTRAVENOUS | Status: DC | PRN
  Administered 2017-05-28: 50 mg via INTRAVENOUS
  Administered 2017-05-28: 20 mg via INTRAVENOUS

## 2017-05-28 MED ORDER — TIZANIDINE HCL 4 MG PO TABS: 2.0000 mg | ORAL_TABLET | Freq: Three times a day (TID) | ORAL | Status: DC

## 2017-05-28 MED ORDER — OXYCODONE HCL 5 MG PO TABS: 5.0000 mg | ORAL_TABLET | ORAL | Status: DC | PRN

## 2017-05-28 MED ORDER — POLYETHYLENE GLYCOL 3350 17 G PO PACK: 17.0000 g | PACK | Freq: Every day | ORAL | Status: DC | PRN

## 2017-05-28 MED ORDER — DEXAMETHASONE SODIUM PHOSPHATE 10 MG/ML IJ SOLN
INTRAMUSCULAR | Status: DC | PRN
  Administered 2017-05-28: 10 mg via INTRAVENOUS

## 2017-05-28 MED ORDER — DEXAMETHASONE SODIUM PHOSPHATE 10 MG/ML IJ SOLN
INTRAMUSCULAR | Status: AC
  Filled 2017-05-28: qty 1

## 2017-05-28 MED ORDER — CARVEDILOL 6.25 MG PO TABS: 6.2500 mg | ORAL_TABLET | Freq: Two times a day (BID) | ORAL | Status: DC

## 2017-05-28 MED ORDER — SODIUM CHLORIDE 0.9 % IR SOLN
Status: DC | PRN
  Administered 2017-05-28: 15:00:00

## 2017-05-28 MED ORDER — NAPHAZOLINE-GLYCERIN 0.012-0.2 % OP SOLN
1.0000 [drp] | Freq: Four times a day (QID) | OPHTHALMIC | Status: DC | PRN
  Filled 2017-05-28: qty 15

## 2017-05-28 MED ORDER — ONDANSETRON HCL 4 MG/2ML IJ SOLN: 4.0000 mg | Freq: Four times a day (QID) | INTRAMUSCULAR | Status: DC | PRN

## 2017-05-28 MED ORDER — 0.9 % SODIUM CHLORIDE (POUR BTL) OPTIME
TOPICAL | Status: DC | PRN
  Administered 2017-05-28: 1000 mL

## 2017-05-28 MED ORDER — SODIUM CHLORIDE 0.9% FLUSH: 3.0000 mL | Freq: Two times a day (BID) | INTRAVENOUS | Status: DC

## 2017-05-28 MED ORDER — SODIUM CHLORIDE 0.9 % IV SOLN: 250.0000 mL | INTRAVENOUS | Status: DC

## 2017-05-28 MED ORDER — HYDROCHLOROTHIAZIDE 12.5 MG PO CAPS: 12.5000 mg | ORAL_CAPSULE | Freq: Every day | ORAL | Status: DC

## 2017-05-28 MED ORDER — CEFAZOLIN SODIUM-DEXTROSE 2-4 GM/100ML-% IV SOLN
2.0000 g | INTRAVENOUS | Status: AC
  Administered 2017-05-28: 2 g via INTRAVENOUS
  Filled 2017-05-28: qty 100

## 2017-05-28 MED ORDER — FENTANYL CITRATE (PF) 250 MCG/5ML IJ SOLN
INTRAMUSCULAR | Status: AC
  Filled 2017-05-28: qty 5

## 2017-05-28 MED ORDER — ACETAMINOPHEN 325 MG PO TABS: 325.0000 mg | ORAL_TABLET | ORAL | Status: DC | PRN

## 2017-05-28 MED ORDER — SENNA 8.6 MG PO TABS: 1.0000 | ORAL_TABLET | Freq: Two times a day (BID) | ORAL | Status: DC

## 2017-05-28 MED ORDER — LIDOCAINE-EPINEPHRINE 1 %-1:100000 IJ SOLN
INTRAMUSCULAR | Status: AC
  Filled 2017-05-28: qty 1

## 2017-05-28 MED ORDER — ACETAMINOPHEN 325 MG PO TABS: 650.0000 mg | ORAL_TABLET | ORAL | Status: DC | PRN

## 2017-05-28 MED ORDER — OXYCODONE HCL 5 MG PO TABS
ORAL_TABLET | ORAL | Status: AC
  Filled 2017-05-28: qty 2

## 2017-05-28 MED ORDER — FLEET ENEMA 7-19 GM/118ML RE ENEM: 1.0000 | ENEMA | Freq: Once | RECTAL | Status: DC | PRN

## 2017-05-28 MED ORDER — SUGAMMADEX SODIUM 200 MG/2ML IV SOLN
INTRAVENOUS | Status: DC | PRN
  Administered 2017-05-28: 200 mg via INTRAVENOUS

## 2017-05-28 MED ORDER — LOSARTAN POTASSIUM-HCTZ 100-12.5 MG PO TABS: 1.0000 | ORAL_TABLET | Freq: Every day | ORAL | Status: DC

## 2017-05-28 MED ORDER — METHOCARBAMOL 1000 MG/10ML IJ SOLN
500.0000 mg | Freq: Four times a day (QID) | INTRAVENOUS | Status: DC | PRN
  Filled 2017-05-28: qty 5

## 2017-05-28 MED ORDER — PHENYLEPHRINE 40 MCG/ML (10ML) SYRINGE FOR IV PUSH (FOR BLOOD PRESSURE SUPPORT)
PREFILLED_SYRINGE | INTRAVENOUS | Status: AC
  Filled 2017-05-28: qty 10

## 2017-05-28 MED ORDER — MORPHINE SULFATE (PF) 4 MG/ML IV SOLN: 2.0000 mg | INTRAVENOUS | Status: DC | PRN

## 2017-05-28 MED ORDER — MENTHOL 3 MG MT LOZG: 1.0000 | LOZENGE | OROMUCOSAL | Status: DC | PRN

## 2017-05-28 MED ORDER — METHOCARBAMOL 500 MG PO TABS
ORAL_TABLET | ORAL | Status: AC
  Filled 2017-05-28: qty 1

## 2017-05-28 MED ORDER — POTASSIUM 95 MG PO TABS: 1.0000 | ORAL_TABLET | Freq: Every day | ORAL | Status: DC

## 2017-05-28 MED ORDER — METHOCARBAMOL 500 MG PO TABS
500.0000 mg | ORAL_TABLET | Freq: Four times a day (QID) | ORAL | Status: DC | PRN
  Administered 2017-05-28: 500 mg via ORAL

## 2017-05-28 MED ORDER — ONDANSETRON HCL 4 MG/2ML IJ SOLN
INTRAMUSCULAR | Status: DC | PRN
  Administered 2017-05-28: 4 mg via INTRAVENOUS

## 2017-05-28 MED ORDER — ACETAMINOPHEN 650 MG RE SUPP: 650.0000 mg | RECTAL | Status: DC | PRN

## 2017-05-28 MED ORDER — PROPOFOL 10 MG/ML IV BOLUS
INTRAVENOUS | Status: DC | PRN
  Administered 2017-05-28: 150 mg via INTRAVENOUS

## 2017-05-28 MED ORDER — LIDOCAINE-EPINEPHRINE 1 %-1:100000 IJ SOLN
INTRAMUSCULAR | Status: DC | PRN
  Administered 2017-05-28: 5 mL

## 2017-05-28 MED ORDER — THROMBIN (RECOMBINANT) 5000 UNITS EX SOLR
OROMUCOSAL | Status: DC | PRN
  Administered 2017-05-28: 15:00:00 via TOPICAL

## 2017-05-28 MED ORDER — THROMBIN 5000 UNITS EX SOLR
CUTANEOUS | Status: AC
  Filled 2017-05-28: qty 5000

## 2017-05-28 MED ORDER — BUPIVACAINE HCL (PF) 0.5 % IJ SOLN
INTRAMUSCULAR | Status: AC
  Filled 2017-05-28: qty 30

## 2017-05-28 MED ORDER — ONDANSETRON HCL 4 MG PO TABS: 4.0000 mg | ORAL_TABLET | Freq: Four times a day (QID) | ORAL | Status: DC | PRN

## 2017-05-28 MED ORDER — ALUM & MAG HYDROXIDE-SIMETH 200-200-20 MG/5ML PO SUSP: 30.0000 mL | Freq: Four times a day (QID) | ORAL | Status: DC | PRN

## 2017-05-28 MED ORDER — ARTIFICIAL TEARS OPHTHALMIC OINT: 1.0000 "application " | TOPICAL_OINTMENT | Freq: Every day | OPHTHALMIC | Status: DC | PRN

## 2017-05-28 MED ORDER — OXYCODONE-ACETAMINOPHEN 7.5-325 MG PO TABS: 1.0000 | ORAL_TABLET | ORAL | 0 refills | Status: DC | PRN

## 2017-05-28 MED ORDER — LACTATED RINGERS IV SOLN
INTRAVENOUS | Status: DC
  Administered 2017-05-28 (×2): via INTRAVENOUS

## 2017-05-28 MED ORDER — ATORVASTATIN CALCIUM 20 MG PO TABS: 40.0000 mg | ORAL_TABLET | Freq: Every day | ORAL | Status: DC

## 2017-05-28 MED ORDER — SPIRONOLACTONE 25 MG PO TABS: 25.0000 mg | ORAL_TABLET | Freq: Every day | ORAL | Status: DC

## 2017-05-28 MED ORDER — OXYCODONE HCL 5 MG PO TABS
10.0000 mg | ORAL_TABLET | ORAL | Status: DC | PRN
  Administered 2017-05-28: 10 mg via ORAL

## 2017-05-28 MED ORDER — LOSARTAN POTASSIUM 50 MG PO TABS: 100.0000 mg | ORAL_TABLET | Freq: Every day | ORAL | Status: DC

## 2017-05-28 MED ORDER — ACETAMINOPHEN 160 MG/5ML PO SOLN: 325.0000 mg | ORAL | Status: DC | PRN

## 2017-05-28 MED ORDER — FENTANYL CITRATE (PF) 100 MCG/2ML IJ SOLN
25.0000 ug | INTRAMUSCULAR | Status: DC | PRN
  Administered 2017-05-28 (×2): 50 ug via INTRAVENOUS

## 2017-05-28 MED ORDER — NEOMYCIN-POLYMYXIN-DEXAMETH 0.1 % OP OINT
2.0000 "application " | TOPICAL_OINTMENT | Freq: Every day | OPHTHALMIC | Status: DC | PRN
  Filled 2017-05-28: qty 3.5

## 2017-05-28 MED ORDER — BUPIVACAINE HCL (PF) 0.5 % IJ SOLN
INTRAMUSCULAR | Status: DC | PRN
  Administered 2017-05-28: 5 mL

## 2017-05-28 MED ORDER — OXYCODONE HCL 5 MG PO TABS: 5.0000 mg | ORAL_TABLET | Freq: Once | ORAL | Status: DC | PRN

## 2017-05-28 SURGICAL SUPPLY — 48 items
ADH SKN CLS APL DERMABOND .7 (GAUZE/BANDAGES/DRESSINGS) ×1
ALCOHOL ISOPROPYL (RUBBING) (MISCELLANEOUS) ×2 IMPLANT
APL SKNCLS STERI-STRIP NONHPOA (GAUZE/BANDAGES/DRESSINGS) ×1
BAG DECANTER FOR FLEXI CONT (MISCELLANEOUS) ×2 IMPLANT
BENZOIN TINCTURE PRP APPL 2/3 (GAUZE/BANDAGES/DRESSINGS) ×2 IMPLANT
BIT DRILL NEURO 2X3.1 SFT TUCH (MISCELLANEOUS) ×1 IMPLANT
CANISTER SUCT 3000ML PPV (MISCELLANEOUS) ×2 IMPLANT
DECANTER SPIKE VIAL GLASS SM (MISCELLANEOUS) ×2 IMPLANT
DERMABOND ADVANCED (GAUZE/BANDAGES/DRESSINGS) ×1
DERMABOND ADVANCED .7 DNX12 (GAUZE/BANDAGES/DRESSINGS) ×1 IMPLANT
DISC MOBI-C CERVICAL 13X17 H5 (Miscellaneous) ×2 IMPLANT
DRAPE C-ARM 42X72 X-RAY (DRAPES) ×4 IMPLANT
DRAPE C-ARMOR (DRAPES) ×2 IMPLANT
DRAPE LAPAROTOMY 100X72 PEDS (DRAPES) ×2 IMPLANT
DRAPE MICROSCOPE LEICA (MISCELLANEOUS) IMPLANT
DRAPE POUCH INSTRU U-SHP 10X18 (DRAPES) ×2 IMPLANT
DRILL NEURO 2X3.1 SOFT TOUCH (MISCELLANEOUS) ×2
DURAPREP 6ML APPLICATOR 50/CS (WOUND CARE) ×2 IMPLANT
ELECT BLADE 4.0 EZ CLEAN MEGAD (MISCELLANEOUS) ×2
ELECT REM PT RETURN 9FT ADLT (ELECTROSURGICAL) ×2
ELECTRODE BLDE 4.0 EZ CLN MEGD (MISCELLANEOUS) IMPLANT
ELECTRODE REM PT RTRN 9FT ADLT (ELECTROSURGICAL) ×1 IMPLANT
GAUZE SPONGE 4X4 16PLY XRAY LF (GAUZE/BANDAGES/DRESSINGS) IMPLANT
GLOVE BIOGEL PI IND STRL 8.5 (GLOVE) ×1 IMPLANT
GLOVE BIOGEL PI INDICATOR 8.5 (GLOVE) ×1
GLOVE ECLIPSE 8.5 STRL (GLOVE) ×2 IMPLANT
GOWN STRL REUS W/ TWL LRG LVL3 (GOWN DISPOSABLE) IMPLANT
GOWN STRL REUS W/ TWL XL LVL3 (GOWN DISPOSABLE) ×1 IMPLANT
GOWN STRL REUS W/TWL 2XL LVL3 (GOWN DISPOSABLE) ×2 IMPLANT
GOWN STRL REUS W/TWL LRG LVL3 (GOWN DISPOSABLE)
GOWN STRL REUS W/TWL XL LVL3 (GOWN DISPOSABLE) ×2
HEMOSTAT POWDER KIT SURGIFOAM (HEMOSTASIS) ×2 IMPLANT
KIT BASIN OR (CUSTOM PROCEDURE TRAY) ×2 IMPLANT
KIT ROOM TURNOVER OR (KITS) ×2 IMPLANT
NDL SPNL 22GX3.5 QUINCKE BK (NEEDLE) ×1 IMPLANT
NEEDLE HYPO 22GX1.5 SAFETY (NEEDLE) ×2 IMPLANT
NEEDLE SPNL 22GX3.5 QUINCKE BK (NEEDLE) ×2 IMPLANT
NS IRRIG 1000ML POUR BTL (IV SOLUTION) ×2 IMPLANT
PACK LAMINECTOMY NEURO (CUSTOM PROCEDURE TRAY) ×2 IMPLANT
PAD ARMBOARD 7.5X6 YLW CONV (MISCELLANEOUS) ×6 IMPLANT
PATTIES SURGICAL .5 X1 (DISPOSABLE) ×2 IMPLANT
RUBBERBAND STERILE (MISCELLANEOUS) IMPLANT
SPONGE INTESTINAL PEANUT (DISPOSABLE) ×2 IMPLANT
SUT VIC AB 4-0 RB1 18 (SUTURE) ×2 IMPLANT
TAPE CLOTH 4X10 WHT NS (GAUZE/BANDAGES/DRESSINGS) IMPLANT
TOWEL GREEN STERILE (TOWEL DISPOSABLE) ×2 IMPLANT
TOWEL GREEN STERILE FF (TOWEL DISPOSABLE) ×2 IMPLANT
WATER STERILE IRR 1000ML POUR (IV SOLUTION) ×2 IMPLANT

## 2017-05-28 NOTE — Progress Notes (Signed)
Patient ID: PHI AVANS, male   DOB: 05-16-1945, 72 y.o.   MRN: [id number] Vital signs are stable Patient is awakening very well Has some intrascapular pain His motor function appears intact We will discharge this evening
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
## 2017-05-28 NOTE — Op Note (Signed)
Date of surgery: 05/28/2017 Preoperative diagnosis: Cervical spondylosis C4-5 C5-6 with radiculopathy and myelopathy, history of decompression arthrodesis C3-4 and C6-C7. Postoperative diagnosis: Same Procedure: Anterior cervical decompression C4-5 and C5-6 arthroplasty with no obesity 17 x 13 x 5 mm arthroplasty device. Surgeon: [name] First assistant: Sherley Bounds MD Anesthesia: General endotracheal Indications: Howard Maxwell is a 72 year old individuals had significant back issues and cervical spine issues.  In the past these had decompression at C3-4 and at C6-C7.  And also has cervical spondylosis now at C4-5 and C5-6 with cord compression and nerve root compromise.  He has been advised regarding the need for surgical decompression but he will undergo arthroplasty to maintain a good level of function.  Procedure: The patient was brought to the operating room supine on the stretcher.  After the smooth induction of general endotracheal anesthesia he was placed on the table in the donor and the neck was prepped with alcohol and DuraPrep and draped in a sterile fashion.  Fluoroscopic guidance was used to localize C4-5 and C5-6.  Transverse incision was created in the neck and carried down to the platysma the plane between the sternocleidomastoid and strap muscles was dissected bluntly on the left side the dissection was carried down to expose the interspace at C5-6 and then C4-5.  The old plate was identified.  Ventral osteophytes over C5-6 were then cleared.  The space was entered.  A self-retaining Caspar type retractor was placed into the wound.  Dissection then entered the disc space and a substantial quantity of severely degenerated desiccated disc material was encountered at the C5-6 space.  Posteriorly significant osteophytosis was noted particularly from the uncinate processes on the left and right.  These were drilled down.  The interspace was cleared such that the posterior longitudinal  ligament was cleared out to the foramen on either side.  Ample foraminotomies were created from the ventral aspect.  Then when hemostasis was achieved the interspace was sized for an appropriately sized 6 pacer from the low [facility] collection and it was felt that a 17 mm wide 13 mm deep 5 mm tall arthroplasty spacer would fit best.  Then under fluoroscopic guidance this was placed into the interspace.  AP and lateral projections were checked before the spacer was finally released.  Attention was then turned to C4-5 where a similar process was carried out again substantial osteophytosis was encountered and a good decompression was obtained with the same size spacer namely 17 mm wide 13 mm deep by 5 mm tunnel was chosen.  This was placed under fluoroscopic guidance.  And then hemostasis in the soft tissues was checked and when verified the retractors were removed the platysma was closed with 3-0 Vicryl in interrupted fashion and 3-0 Vicryl was used in the subarticular skin Dermabond was used on the skin and blood loss was estimated at 100 cc.

## 2017-05-28 NOTE — Anesthesia Procedure Notes (Signed)
Procedure Name: Intubation Date/Time: 05/28/2017 2:30 PM Performed by: Shirlyn Goltz, CRNA Pre-anesthesia Checklist: Patient identified, Emergency Drugs available, Suction available and Patient being monitored Patient Re-evaluated:Patient Re-evaluated prior to induction Oxygen Delivery Method: Circle system utilized Preoxygenation: Pre-oxygenation with 100% oxygen Induction Type: IV induction Ventilation: Mask ventilation without difficulty and Oral airway inserted - appropriate to patient size Laryngoscope Size: Mac and 4 Grade View: Grade III Tube type: Oral Tube size: 7.5 mm Number of attempts: 1 Airway Equipment and Method: Stylet Placement Confirmation: ETT inserted through vocal cords under direct vision,  positive ETCO2 and breath sounds checked- equal and bilateral Secured at: 21 cm Tube secured with: Tape Dental Injury: Teeth and Oropharynx as per pre-operative assessment

## 2017-05-28 NOTE — Transfer of Care (Signed)
Immediate Anesthesia Transfer of Care Note  Patient: Howard Maxwell  Procedure(s) Performed: Cervical Four- Five Cerivcal Five-Six Artificial disc replacement (N/A )  Patient Location: PACU  Anesthesia Type:General  Level of Consciousness: awake, alert , oriented and patient cooperative  Airway & Oxygen Therapy: Patient Spontanous Breathing and Patient connected to nasal cannula oxygen  Post-op Assessment: Report given to RN and Post -op Vital signs reviewed and stable  Post vital signs: Reviewed and stable  Last Vitals:  Vitals:   05/28/17 1010 05/28/17 1734  BP: (!) 144/77   Pulse: (!) 58 81  Resp: 18 13  Temp: 37.1 C (!) 36.4 C  SpO2: 92% 94%    Last Pain:  Vitals:   05/28/17 1734  PainSc: Asleep      Patients Stated Pain Goal: 3 (73/41/93 7902)  Complications: No apparent anesthesia complications

## 2017-05-28 NOTE — Discharge Summary (Signed)
Physician Discharge Summary  Patient ID: Howard Maxwell MRN: [id number] DOB/AGE: 11/04/1945 72 y.o.  Admit date: 05/28/2017 Discharge date: 05/28/2017  Admission Diagnoses: Cervical spondylosis with radiculopathy C4-5 C5-6 history of arthrodesis C3-4 C6-7  Discharge Diagnoses: Cervical spondylosis with radiculopathy and myelopathy C4-5 C5-6.  History of arthrodesis C3-4 C6-7.  Diabetes mellitus Active Problems:   Cervical radiculopathy   Discharged Condition: good  Hospital Course: Patient was admitted to undergo surgical decompression at C4-5 C5-6 he underwent an arthroplasty at these 2 levels also he tolerated this well.  Consults: None  Significant Diagnostic Studies: None  Treatments: surgery: Anterior cervical decompression C4-5 C5-6 arthroplasty with Mobi C  Discharge Exam: Blood pressure (!) 169/91, pulse 79, temperature (!) 97.5 F (36.4 C), resp. rate 13, weight 113.4 kg (250 lb), SpO2 93 %. Incision is clean and dry and motor function appears intact.  Disposition: 01-Home or Self Care  Discharge Instructions    Call MD for:  redness, tenderness, or signs of infection (pain, swelling, redness, odor or green/yellow discharge around incision site)   Complete by:  As directed    Call MD for:  severe uncontrolled pain   Complete by:  As directed    Call MD for:  temperature >100.4   Complete by:  As directed    Diet - low sodium heart healthy   Complete by:  As directed    Discharge instructions   Complete by:  As directed    Okay to shower. Do not apply salves or appointments to incision. No heavy lifting with the upper extremities greater than 15 pounds. May resume driving when not requiring pain medication and patient feels comfortable with doing so.   Incentive spirometry RT   Complete by:  As directed    Increase activity slowly   Complete by:  As directed      Allergies as of 05/28/2017      Reactions   Codeine Sulfate Nausea Only   Onset 02/19/1999. Onset  02/19/1999.   Loratadine Other (See Comments)   Leaves bad taste in the mouth.      Medication List    TAKE these medications   atorvastatin 40 MG tablet Commonly known as:  LIPITOR Take 1 tablet (40 mg total) by mouth daily.   CALCIUM 600/VITAMIN D3 600-800 MG-UNIT Tabs Generic drug:  Calcium Carb-Cholecalciferol Take 1 tablet by mouth daily.   carvedilol 6.25 MG tablet Commonly known as:  COREG Take 1 tablet (6.25 mg total) by mouth 2 (two) times daily.   CENTRUM SILVER PO Take 1 tablet by mouth daily.   CoQ10 200 MG Caps Take 1 capsule by mouth daily.   diazepam 5 MG tablet Commonly known as:  VALIUM Take 1 tablet (5 mg total) by mouth every 6 (six) hours as needed for muscle spasms.   fluocinonide gel 0.05 % Commonly known as:  LIDEX Apply 1 application topically daily as needed (itching).   glipiZIDE 5 MG 24 hr tablet Commonly known as:  GLUCOTROL XL Take 5 mg by mouth daily with breakfast.   KRILL OIL OMEGA-3 PO Take 350 mg daily at 6 (six) AM by mouth.   losartan-hydrochlorothiazide 100-12.5 MG tablet Commonly known as:  HYZAAR Take 1 tablet by mouth daily.   Magnesium Oxide 500 MG Caps Take 1 capsule (500 mg total) by mouth 2 (two) times daily at 8 am and 10 pm.   metFORMIN 850 MG tablet Commonly known as:  GLUCOPHAGE Take 850 mg by mouth 2 (two) times  daily with a meal.   naphazoline 0.1 % ophthalmic solution Commonly known as:  NAPHCON Place 1 drop into both eyes as needed (redness).   neomycin-polymyxin-dexameth 0.1 % Oint Commonly known as:  MAXITROL Place 2 application into the left eye daily as needed (blind eye).   omeprazole 40 MG capsule Commonly known as:  PRILOSEC Take 40 mg by mouth daily.   ONE TOUCH ULTRA TEST test strip Generic drug:  glucose blood USE AS DIRECTED TWICE A DAY   oxyCODONE 5 MG immediate release tablet Commonly known as:  Oxy IR/ROXICODONE Take 1 tablet (5 mg total) by mouth every 6 (six) hours as needed for  severe pain. Start taking on:  06/10/2017   oxyCODONE 5 MG immediate release tablet Commonly known as:  Oxy IR/ROXICODONE Take 1 tablet (5 mg total) by mouth every 6 (six) hours as needed for severe pain. Start taking on:  07/10/2017   oxyCODONE 5 MG immediate release tablet Commonly known as:  Oxy IR/ROXICODONE Take 1 tablet (5 mg total) by mouth every 6 (six) hours as needed for severe pain. Start taking on:  08/09/2017   oxyCODONE-acetaminophen 7.5-325 MG tablet Commonly known as:  PERCOCET Take 1 tablet by mouth every 4 (four) hours as needed for severe pain.   Potassium 95 MG Tabs Take 1 tablet by mouth daily.   Potassium 99 MG Tabs Take 99 mg by mouth daily.   SLOW FE 142 (45 Fe) MG Tbcr Generic drug:  Ferrous Sulfate Take 1 tablet by mouth daily.   spironolactone 25 MG tablet Commonly known as:  ALDACTONE Take 1 tablet (25 mg total) by mouth daily.   SYSTANE 0.4-0.3 % Soln Generic drug:  Polyethyl Glycol-Propyl Glycol Place 1 drop into both eyes daily as needed (dry eyes).   TART CHERRY ADVANCED PO Take 1-2 tablets by mouth daily.   tiZANidine 2 MG tablet Commonly known as:  ZANAFLEX Take 2 mg by mouth 3 (three) times daily.   vitamin B-12 1000 MCG tablet Commonly known as:  CYANOCOBALAMIN Take 1,000 mcg by mouth daily.   vitamin C 500 MG tablet Commonly known as:  ASCORBIC ACID Take 500 mg by mouth daily.        SignedEarleen Newport 05/28/2017, 7:19 PM

## 2017-05-28 NOTE — Anesthesia Preprocedure Evaluation (Signed)
Anesthesia Evaluation  Patient identified by MRN, date of birth, ID band Patient awake    Reviewed: Allergy & Precautions, NPO status , Patient's Chart, lab work & pertinent test results, reviewed documented beta blocker date and time   History of Anesthesia Complications Negative for: history of anesthetic complications  Airway Mallampati: II  TM Distance: >3 FB Neck ROM: Full    Dental  (+) Dental Advisory Given   Pulmonary sleep apnea , COPD, former smoker,    breath sounds clear to auscultation       Cardiovascular hypertension, Pt. on medications and Pt. on home beta blockers (-) angina+ CAD and + Past MI   Rhythm:Regular     Neuro/Psych  Neuromuscular disease negative psych ROS   GI/Hepatic PUD, GERD  Medicated and Controlled,  Endo/Other  diabetes, Type 2  Renal/GU      Musculoskeletal  (+) Arthritis ,   Abdominal   Peds  Hematology  (+) anemia ,   Anesthesia Other Findings Anesthesia Chart Review: Patient is a 72 year old male scheduled for C4-5, C5-6 artifical disc replacement on 05/28/17 by Dr. [name].  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   - PCP is Dr. Tracie Harrier with [facility] ([place]). Last visit 03/03/17.  - Cardiologist is Dr.  Kathlyn [place]. Last visit 12/11/16. In regards to upcoming surgery, on 05/13/17, he wrote, "Moderate risk. No need for a stress test. If he is on aspirin, he should hold that 5 days before surgery." - Interventional Pain Specialist is Dr. Milinda Pointer.   - GI is Dr. Keith Rake at [facility] ([place]). - Ophthalmologist is Dr. Norlene Duel ([place]).  Meds Lipitor, Coreg, ferrous sulfate, glipizide, Krill oil, losartan-HCTZ, magnesium oxide, metformin, naphazoline ophthalmic, Prilosec, Oxy IR, potassium, Aldactone, Zanaflex.   BP (!) 155/71   Pulse 69   Temp 36.8 C   Resp 20   Ht 5\' 10"  (1.778 m)   Wt 261 lb 14.4 oz (118.8 kg)   SpO2 97%   BMI 37.58 kg/m   EKG 12/11/16: SB at 57 bpm, first degree AV block, inferior infarct (age undetermined).  Cardiac cath 12/07/13 [facility]):   -Hemodynamics: Hemodynamic assessment demonstrates borderline systemic hypertension and mildly elevated LVEDP. -Coronary circulation: , there was severe 1 vessel CAD.  RCA is occluded with faint collaterals.  The vessel is medium in size and codominant. 30% mid LAD. DIAG1 (very small), DIAG2, CX, OM1, OM2, OM3, LPLA1, LPLA2 all with only minor luminal irregularities.  - Cardiac structures: Global left ventricular function was normal.  EF estimated at 55%. - Recommendations: Patient management should include aggressive medical therapy and cardiac rehabilitation program.  CXR 09/27/16: IMPRESSION: Stable exam. Bilateral pleural thickening and hyperexpansion. No acute cardiopulmonary findings.  MRI c-spine 05/03/17: IMPRESSION: - Worsened facet arthropathy on the left at C4-5 since the prior MRI with new marrow edema and cystic change in the facets. Mild deformity of the ventral cord at C4-5 and moderate to moderately severe bilateral foraminal narrowing, worse on the left, do not appear markedly changed. - Status post C3-4 fusion. The central canal is open. Moderate to moderately severe foraminal narrowing is worse on the left. -  Status post C6-7 fusion. No bridging bone is seen across the disc interspace raise the possibility of pseudoarthrosis. This could be better evaluated with CT scan. Moderately severe to severe foraminal narrowing at this level is worse on  the left and unchanged. The central canal is open. - Deformity of the ventral cord and moderately severe to severe left foraminal narrowing at C5-6. - Moderate to moderately severe bilateral foraminal narrowing C7-T1.  Preoperative labs noted. Cr 1.17, Na 134, Cl 96 (range 96-101 since 04/27/16), Ca 8.8, glucose 238, A1c 8.3, H/H 12.8/39.7, PLT 220. T&S done.   Patient has cardiac clearance for surgery (moderate risk). Patient will get a fasting CBG on the day of surgery. If results acceptable and otherwise no acute changes then I anticipate that he can proceed as planned.    Reproductive/Obstetrics                             Anesthesia Physical Anesthesia Plan  ASA: III  Anesthesia Plan: General   Post-op Pain Management:    Induction: Intravenous  PONV Risk Score and Plan: 2 and Ondansetron and Dexamethasone  Airway Management Planned: Oral ETT  Additional Equipment: None  Intra-op Plan:   Post-operative Plan: Extubation in OR  Informed Consent: I have reviewed the patients History and Physical, chart, labs and discussed the procedure including the risks, benefits and alternatives for the proposed anesthesia with the patient or authorized representative who has indicated his/her understanding and acceptance.   Dental advisory given  Plan Discussed with: CRNA and Surgeon  Anesthesia Plan Comments:         Anesthesia Quick Evaluation

## 2017-05-28 NOTE — Progress Notes (Signed)
Patient discharged to home accompanied by spouse. Prescription and discharge instruction given and verbalized understanding. No signs of any form of distress noted, vital signs stable at the time of discharge.

## 2017-05-31 MED FILL — Thrombin For Soln 5000 Unit: CUTANEOUS | Qty: 5000 | Status: AC

## 2017-05-31 NOTE — Anesthesia Postprocedure Evaluation (Signed)
Anesthesia Post Note  Patient: Howard Maxwell  Procedure(s) Performed: Cervical Four- Five Cerivcal Five-Six Artificial disc replacement (N/A )     Patient location during evaluation: PACU Anesthesia Type: General Level of consciousness: awake and alert Pain management: pain level controlled Vital Signs Assessment: post-procedure vital signs reviewed and stable Respiratory status: spontaneous breathing, nonlabored ventilation, respiratory function stable and patient connected to nasal cannula oxygen Cardiovascular status: blood pressure returned to baseline and stable Postop Assessment: no apparent nausea or vomiting Anesthetic complications: no    Last Vitals:  Vitals:   05/28/17 1900 05/28/17 2003  BP: (!) 163/86 (!) 163/82  Pulse: 83 79  Resp: 18 20  Temp: 37 C 36.8 C  SpO2: 94% 94%    Last Pain:  Vitals:   05/28/17 2003  TempSrc: Oral  PainSc:                  Lacona S

## 2017-06-02 ENCOUNTER — Ambulatory Visit: Payer: Medicare Other | Admitting: Nurse Practitioner

## 2017-06-02 ENCOUNTER — Encounter (HOSPITAL_COMMUNITY): Payer: Self-pay | Admitting: Neurological Surgery

## 2017-06-03 ENCOUNTER — Telehealth: Payer: Self-pay | Admitting: Pain Medicine

## 2017-06-03 ENCOUNTER — Encounter: Payer: Medicare Other | Admitting: Nurse Practitioner

## 2017-06-03 NOTE — Telephone Encounter (Signed)
Patient wants to know if he could take docusat / senna 50. It is for constipation. Would need a script for it.

## 2017-06-03 NOTE — Telephone Encounter (Signed)
Spoke with patient and let him know that he can use this and it is over the counter.  Patient states that he had been using Dulcolax but was too harsh for him.  Verbalizes u/o conversation.

## 2017-06-07 ENCOUNTER — Encounter (HOSPITAL_COMMUNITY): Payer: Self-pay | Admitting: Neurological Surgery

## 2017-06-08 ENCOUNTER — Ambulatory Visit: Payer: Medicare Other | Admitting: Internal Medicine

## 2017-06-10 ENCOUNTER — Encounter (HOSPITAL_COMMUNITY): Payer: Self-pay | Admitting: Neurological Surgery

## 2017-06-10 DIAGNOSIS — R2991 Unspecified symptoms and signs involving the musculoskeletal system: Secondary | ICD-10-CM | POA: Insufficient documentation

## 2017-06-11 ENCOUNTER — Ambulatory Visit: Payer: Medicare Other | Admitting: Cardiovascular Disease

## 2017-06-15 ENCOUNTER — Encounter: Payer: Self-pay | Admitting: *Deleted

## 2017-06-15 NOTE — Anesthesia Pain Management Evaluation Note (Signed)
ACDF 05/28/17 SENT FOR CLEARANCE TO PROCEED TO NEUROSURGEON

## 2017-06-15 NOTE — Pre-Procedure Instructions (Signed)
AS INSTRUCTED BY DR Lenna Sciara PISCITELLO, REQUEST TO HAVE NEUROSURGEON [place] CATARACT SUGERY FAXED TO DR [name]. LM FOR ARISHA. ALSO NOTIFIED Jemison

## 2017-06-18 NOTE — Pre-Procedure Instructions (Signed)
Telephone call to Dr. Ellene Route to confirm whether patient is acceptable to proceed with cataract surgery next Tuesday.  Minette Brine stated that all of the physicians appointments for yesterday were rescheduled but that the patient was seen on 06/10/2017. There is no confirmation regarding his clearance for surgery.  Left message for Annett Gula, CMA to please return call to P.A.T. To inform us of the status for this patient to undergo cataract surgery.

## 2017-06-21 ENCOUNTER — Other Ambulatory Visit: Payer: Self-pay | Admitting: Cardiovascular Disease

## 2017-06-22 ENCOUNTER — Encounter
Admission: RE | Disposition: A | Discharge status: Home / Self Care | Payer: Self-pay | Source: Ambulatory Visit | Attending: Ophthalmology

## 2017-06-22 ENCOUNTER — Ambulatory Visit: Payer: Medicare Other | Admitting: Anesthesiology

## 2017-06-22 ENCOUNTER — Ambulatory Visit
Admission: RE | Admit: 2017-06-22 | Discharge: 2017-06-22 | Disposition: A | Discharge status: Home / Self Care | Payer: Medicare Other | Source: Ambulatory Visit | Attending: Ophthalmology | Admitting: Ophthalmology

## 2017-06-22 ENCOUNTER — Encounter: Payer: Self-pay | Admitting: *Deleted

## 2017-06-22 ENCOUNTER — Other Ambulatory Visit: Payer: Self-pay

## 2017-06-22 DIAGNOSIS — E1136 Type 2 diabetes mellitus with diabetic cataract: Secondary | ICD-10-CM | POA: Diagnosis not present

## 2017-06-22 DIAGNOSIS — K219 Gastro-esophageal reflux disease without esophagitis: Secondary | ICD-10-CM | POA: Insufficient documentation

## 2017-06-22 DIAGNOSIS — M1990 Unspecified osteoarthritis, unspecified site: Secondary | ICD-10-CM | POA: Diagnosis not present

## 2017-06-22 DIAGNOSIS — I25119 Atherosclerotic heart disease of native coronary artery with unspecified angina pectoris: Secondary | ICD-10-CM | POA: Insufficient documentation

## 2017-06-22 DIAGNOSIS — G8929 Other chronic pain: Secondary | ICD-10-CM | POA: Insufficient documentation

## 2017-06-22 DIAGNOSIS — J449 Chronic obstructive pulmonary disease, unspecified: Secondary | ICD-10-CM | POA: Insufficient documentation

## 2017-06-22 DIAGNOSIS — Z9049 Acquired absence of other specified parts of digestive tract: Secondary | ICD-10-CM | POA: Insufficient documentation

## 2017-06-22 DIAGNOSIS — I10 Essential (primary) hypertension: Secondary | ICD-10-CM | POA: Insufficient documentation

## 2017-06-22 DIAGNOSIS — E1142 Type 2 diabetes mellitus with diabetic polyneuropathy: Secondary | ICD-10-CM | POA: Insufficient documentation

## 2017-06-22 DIAGNOSIS — M549 Dorsalgia, unspecified: Secondary | ICD-10-CM | POA: Diagnosis not present

## 2017-06-22 DIAGNOSIS — D649 Anemia, unspecified: Secondary | ICD-10-CM | POA: Insufficient documentation

## 2017-06-22 DIAGNOSIS — H2511 Age-related nuclear cataract, right eye: Secondary | ICD-10-CM | POA: Diagnosis present

## 2017-06-22 DIAGNOSIS — Z87891 Personal history of nicotine dependence: Secondary | ICD-10-CM | POA: Insufficient documentation

## 2017-06-22 DIAGNOSIS — I252 Old myocardial infarction: Secondary | ICD-10-CM | POA: Diagnosis not present

## 2017-06-22 DIAGNOSIS — G4730 Sleep apnea, unspecified: Secondary | ICD-10-CM | POA: Diagnosis not present

## 2017-06-22 HISTORY — DX: Hemorrhage, not elsewhere classified: R58

## 2017-06-22 HISTORY — PX: CATARACT EXTRACTION W/PHACO: SHX586

## 2017-06-22 HISTORY — DX: Polyneuropathy, unspecified: G62.9

## 2017-06-22 LAB — GLUCOSE, CAPILLARY: Glucose-Capillary: 202 mg/dL — ABNORMAL HIGH (ref 65–99)

## 2017-06-22 SURGERY — PHACOEMULSIFICATION, CATARACT, WITH IOL INSERTION
Anesthesia: Monitor Anesthesia Care | Site: Eye | Laterality: Right | Wound class: Clean

## 2017-06-22 MED ORDER — POVIDONE-IODINE 5 % OP SOLN
OPHTHALMIC | Status: DC | PRN
  Administered 2017-06-22: 1 via OPHTHALMIC

## 2017-06-22 MED ORDER — CARBACHOL 0.01 % IO SOLN
INTRAOCULAR | Status: DC | PRN
  Administered 2017-06-22: 0.5 mL via INTRAOCULAR

## 2017-06-22 MED ORDER — MOXIFLOXACIN HCL 0.5 % OP SOLN
OPHTHALMIC | Status: AC
  Filled 2017-06-22: qty 3

## 2017-06-22 MED ORDER — ARMC OPHTHALMIC DILATING DROPS
OPHTHALMIC | Status: AC
  Filled 2017-06-22: qty 0.4

## 2017-06-22 MED ORDER — ARMC OPHTHALMIC DILATING DROPS
1.0000 "application " | OPHTHALMIC | Status: AC
  Administered 2017-06-22 (×3): 1 via OPHTHALMIC

## 2017-06-22 MED ORDER — NA CHONDROIT SULF-NA HYALURON 40-17 MG/ML IO SOLN
INTRAOCULAR | Status: DC | PRN
  Administered 2017-06-22: 1 mL via INTRAOCULAR

## 2017-06-22 MED ORDER — DEXMEDETOMIDINE HCL 200 MCG/2ML IV SOLN
INTRAVENOUS | Status: DC | PRN
  Administered 2017-06-22 (×2): 4 ug via INTRAVENOUS

## 2017-06-22 MED ORDER — EPINEPHRINE PF 1 MG/ML IJ SOLN
INTRAOCULAR | Status: DC | PRN
  Administered 2017-06-22: 07:00:00 via OPHTHALMIC

## 2017-06-22 MED ORDER — NA CHONDROIT SULF-NA HYALURON 40-17 MG/ML IO SOLN
INTRAOCULAR | Status: AC
  Filled 2017-06-22: qty 1

## 2017-06-22 MED ORDER — MOXIFLOXACIN HCL 0.5 % OP SOLN: 1.0000 [drp] | OPHTHALMIC | Status: DC | PRN

## 2017-06-22 MED ORDER — DEXMEDETOMIDINE HCL IN NACL 200 MCG/50ML IV SOLN
INTRAVENOUS | Status: AC
  Filled 2017-06-22: qty 50

## 2017-06-22 MED ORDER — POVIDONE-IODINE 5 % OP SOLN
OPHTHALMIC | Status: AC
  Filled 2017-06-22: qty 30

## 2017-06-22 MED ORDER — MOXIFLOXACIN HCL 0.5 % OP SOLN
OPHTHALMIC | Status: DC | PRN
  Administered 2017-06-22: 0.2 mL via OPHTHALMIC

## 2017-06-22 MED ORDER — EPINEPHRINE PF 1 MG/ML IJ SOLN
INTRAMUSCULAR | Status: AC
  Filled 2017-06-22: qty 2

## 2017-06-22 MED ORDER — SODIUM CHLORIDE 0.9 % IV SOLN
INTRAVENOUS | Status: DC
  Administered 2017-06-22: 07:00:00 via INTRAVENOUS

## 2017-06-22 MED ORDER — LIDOCAINE HCL (PF) 4 % IJ SOLN
INTRAMUSCULAR | Status: DC | PRN
  Administered 2017-06-22: 4 mL via OPHTHALMIC

## 2017-06-22 MED ORDER — LIDOCAINE HCL (PF) 4 % IJ SOLN
INTRAMUSCULAR | Status: AC
  Filled 2017-06-22: qty 5

## 2017-06-22 SURGICAL SUPPLY — 16 items
GLOVE BIO SURGEON STRL SZ8 (GLOVE) ×2 IMPLANT
GLOVE BIOGEL M 6.5 STRL (GLOVE) ×2 IMPLANT
GLOVE SURG LX 8.0 MICRO (GLOVE) ×1
GLOVE SURG LX STRL 8.0 MICRO (GLOVE) ×1 IMPLANT
GOWN STRL REUS W/ TWL LRG LVL3 (GOWN DISPOSABLE) ×2 IMPLANT
GOWN STRL REUS W/TWL LRG LVL3 (GOWN DISPOSABLE) ×4
LABEL CATARACT MEDS ST (LABEL) ×2 IMPLANT
LENS IOL TECNIS ITEC 18.5 (Intraocular Lens) ×1 IMPLANT
PACK CATARACT (MISCELLANEOUS) ×2 IMPLANT
PACK CATARACT BRASINGTON LX (MISCELLANEOUS) ×2 IMPLANT
PACK EYE AFTER SURG (MISCELLANEOUS) ×2 IMPLANT
SOL BSS BAG (MISCELLANEOUS) ×2
SOLUTION BSS BAG (MISCELLANEOUS) ×1 IMPLANT
SYR 5ML LL (SYRINGE) ×2 IMPLANT
WATER STERILE IRR 250ML POUR (IV SOLUTION) ×2 IMPLANT
WIPE NON LINTING 3.25X3.25 (MISCELLANEOUS) ×2 IMPLANT

## 2017-06-22 NOTE — Transfer of Care (Signed)
Immediate Anesthesia Transfer of Care Note  Patient: Howard Maxwell  Procedure(s) Performed: CATARACT EXTRACTION PHACO AND INTRAOCULAR LENS PLACEMENT (IOC) (Right Eye)  Patient Location: Short Stay  Anesthesia Type:MAC  Level of Consciousness: awake, alert , oriented and patient cooperative  Airway & Oxygen Therapy: Patient Spontanous Breathing and Patient connected to nasal cannula oxygen  Post-op Assessment: Report given to RN and Post -op Vital signs reviewed and stable  Post vital signs: Reviewed and stable  Last Vitals:  Vitals Value Taken Time  BP    Temp    Pulse    Resp    SpO2      Last Pain:  Vitals:   06/22/17 0619  TempSrc: Tympanic  PainSc: 0-No pain         Complications: No apparent anesthesia complications

## 2017-06-22 NOTE — Op Note (Signed)
PREOPERATIVE DIAGNOSIS:  Nuclear sclerotic cataract of the right eye.   POSTOPERATIVE DIAGNOSIS:  NUCLEAR SCLEROTIC CATARACT RIGHT EYE   OPERATIVE PROCEDURE: Procedure(s): CATARACT EXTRACTION PHACO AND INTRAOCULAR LENS PLACEMENT (IOC)   SURGEON:  Birder Robson, MD.   ANESTHESIA:  Anesthesiologist: Alphonsus Sias, MD CRNA: Eben Burow, CRNA  1.      Managed anesthesia care. 2.      0.60ml of Shugarcaine was instilled in the eye following the paracentesis.   COMPLICATIONS:  None.   TECHNIQUE:   Stop and chop   DESCRIPTION OF PROCEDURE:  The patient was examined and consented in the preoperative holding area where the aforementioned topical anesthesia was applied to the right eye and then brought back to the Operating Room where the right eye was prepped and draped in the usual sterile ophthalmic fashion and a lid speculum was placed. A paracentesis was created with the side port blade and the anterior chamber was filled with viscoelastic. A near clear corneal incision was performed with the steel keratome. A continuous curvilinear capsulorrhexis was performed with a cystotome followed by the capsulorrhexis forceps. Hydrodissection and hydrodelineation were carried out with BSS on a blunt cannula. The lens was removed in a stop and chop  technique and the remaining cortical material was removed with the irrigation-aspiration handpiece. The capsular bag was inflated with viscoelastic and the Technis [id number]  lens was placed in the capsular bag without complication. The remaining viscoelastic was removed from the eye with the irrigation-aspiration handpiece. The wounds were hydrated. The anterior chamber was flushed with Miostat and the eye was inflated to physiologic pressure. 0.16ml of Vigamox was placed in the anterior chamber. The wounds were found to be water tight. The eye was dressed with Vigamox. The patient was given protective glasses to wear throughout the day and a shield with which to  sleep tonight. The patient was also given drops with which to begin a drop regimen today and will follow-up with me in one day. Implant Name Type Inv. Item Serial No. Manufacturer Lot No. LRB No. Used  LENS IOL DIOP 18.5 - [id number] Intraocular Lens LENS IOL DIOP 18.5 [id number] AMO  Right 1   Procedure(s) with comments: CATARACT EXTRACTION PHACO AND INTRAOCULAR LENS PLACEMENT (IOC) (Right) - [place] 00:41.9 AP% 12.2 CDE 5.09 Fluid Pack lot # [id number]   Electronically signed: Birder Robson 06/22/2017 7:43 AM

## 2017-06-22 NOTE — Anesthesia Post-op Follow-up Note (Signed)
Anesthesia QCDR form completed.        

## 2017-06-22 NOTE — Anesthesia Postprocedure Evaluation (Signed)
Anesthesia Post Note  Patient: Howard Maxwell  Procedure(s) Performed: CATARACT EXTRACTION PHACO AND INTRAOCULAR LENS PLACEMENT (IOC) (Right Eye)  Patient location during evaluation: Endoscopy Anesthesia Type: MAC Level of consciousness: awake and alert Pain management: pain level controlled Vital Signs Assessment: post-procedure vital signs reviewed and stable Respiratory status: spontaneous breathing, nonlabored ventilation and respiratory function stable Cardiovascular status: blood pressure returned to baseline and stable Postop Assessment: no apparent nausea or vomiting Anesthetic complications: no     Last Vitals:  Vitals:   06/22/17 0746 06/22/17 0805  BP: (!) 146/79 (!) 149/72  Pulse: (!) 58 90  Resp: 15 16  Temp: (!) 36.2 C   SpO2: 96% 94%    Last Pain:  Vitals:   06/22/17 0805  TempSrc:   PainSc: 0-No pain                 Alphonsus Sias

## 2017-06-22 NOTE — Discharge Instructions (Signed)
Eye Surgery Discharge Instructions  Expect mild scratchy sensation or mild soreness. DO NOT RUB YOUR EYE!  The day of surgery:  Minimal physical activity, but bed rest is not required  No reading, computer work, or close hand work  No bending, lifting, or straining.  May watch TV  For 24 hours:  No driving, legal decisions, or alcoholic beverages  Safety precautions  Eat anything you prefer: It is better to start with liquids, then soup then solid foods.  _____ Eye patch should be worn until postoperative exam tomorrow.  ____ Solar shield eyeglasses should be worn for comfort in the sunlight/patch while sleeping  Resume all regular medications including aspirin or Coumadin if these were discontinued prior to surgery. You may shower, bathe, shave, or wash your hair. Tylenol may be taken for mild discomfort.  Call your doctor if you experience significant pain, nausea, or vomiting, fever > 101 or other signs of infection. [phone] or [phone] Specific instructions:  Follow-up Information    Birder Robson, MD Follow up on 06/23/2017.   Specialty:  Ophthalmology Why:  follow up appointment at 10:00am in the office Contact information: [address] [place] [address] [phone]         Eye Surgery Discharge Instructions  Expect mild scratchy sensation or mild soreness. DO NOT RUB YOUR EYE!  The day of surgery:  Minimal physical activity, but bed rest is not required  No reading, computer work, or close hand work  No bending, lifting, or straining.  May watch TV  For 24 hours:  No driving, legal decisions, or alcoholic beverages  Safety precautions  Eat anything you prefer: It is better to start with liquids, then soup then solid foods.  _____ Eye patch should be worn until postoperative exam tomorrow.  __x__ Solar shield eyeglasses should be worn for comfort in the sunlight/patch while sleeping  Resume all regular medications  including aspirin or Coumadin if these were discontinued prior to surgery. You may shower, bathe, shave, or wash your hair. Tylenol may be taken for mild discomfort.  Call your doctor if you experience significant pain, nausea, or vomiting, fever > 101 or other signs of infection. [phone] or [phone] Specific instructions:  Follow-up Information    Birder Robson, MD Follow up on 06/23/2017.   Specialty:  Ophthalmology Why:  follow up appointment at 10:00am in the office Contact information: [place] [address] [phone]

## 2017-06-22 NOTE — H&P (Signed)
All labs reviewed. Abnormal studies sent to patients PCP when indicated.  Previous H&P reviewed, patient examined, there are NO CHANGES.  Howard Gullickson Porfilio4/2/20197:21 AM

## 2017-06-22 NOTE — Anesthesia Preprocedure Evaluation (Addendum)
Anesthesia Evaluation  Patient identified by MRN, date of birth, ID band Patient awake    Reviewed: Allergy & Precautions, H&P , NPO status , reviewed documented beta blocker date and time   Airway Mallampati: III       Dental  (+) Caps   Pulmonary sleep apnea , COPD, former smoker,    Pulmonary exam normal        Cardiovascular hypertension, + angina + CAD and + Past MI  Normal cardiovascular exam     Neuro/Psych  Neuromuscular disease    GI/Hepatic GERD  Controlled,  Endo/Other  diabetes  Renal/GU      Musculoskeletal  (+) Arthritis ,   Abdominal   Peds  Hematology  (+) anemia ,   Anesthesia Other Findings S/P ACF 05/2017  Reproductive/Obstetrics                             Anesthesia Physical Anesthesia Plan  ASA: III  Anesthesia Plan: MAC   Post-op Pain Management:    Induction:   PONV Risk Score and Plan: 2 and Midazolam and TIVA  Airway Management Planned:   Additional Equipment:   Intra-op Plan:   Post-operative Plan:   Informed Consent: I have reviewed the patients History and Physical, chart, labs and discussed the procedure including the risks, benefits and alternatives for the proposed anesthesia with the patient or authorized representative who has indicated his/her understanding and acceptance.   Dental Advisory Given  Plan Discussed with: CRNA  Anesthesia Plan Comments:        Anesthesia Quick Evaluation

## 2017-07-06 ENCOUNTER — Encounter: Payer: Self-pay | Admitting: Cardiovascular Disease

## 2017-07-06 ENCOUNTER — Ambulatory Visit: Payer: Medicare Other | Admitting: Cardiovascular Disease

## 2017-07-06 VITALS — BP 114/74 | HR 59 | Ht 70.0 in | Wt 262.2 lb

## 2017-07-06 DIAGNOSIS — E7800 Pure hypercholesterolemia, unspecified: Secondary | ICD-10-CM | POA: Diagnosis not present

## 2017-07-06 DIAGNOSIS — I471 Supraventricular tachycardia: Secondary | ICD-10-CM | POA: Diagnosis not present

## 2017-07-06 DIAGNOSIS — I10 Essential (primary) hypertension: Secondary | ICD-10-CM

## 2017-07-06 DIAGNOSIS — I2510 Atherosclerotic heart disease of native coronary artery without angina pectoris: Secondary | ICD-10-CM | POA: Diagnosis not present
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
## 2017-07-06 NOTE — Patient Instructions (Signed)
Medication Instructions: Continue same medications.   Labwork: None.   Procedures/Testing: None.   Follow-Up: 1 year with Dr. Arida.   Any Additional Special Instructions Will Be Listed Below (If Applicable).     If you need a refill on your cardiac medications before your next appointment, please call your pharmacy.   
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
## 2017-07-13 ENCOUNTER — Other Ambulatory Visit: Payer: Self-pay | Admitting: Neurological Surgery

## 2017-07-13 DIAGNOSIS — M48062 Spinal stenosis, lumbar region with neurogenic claudication: Secondary | ICD-10-CM

## 2017-07-21 ENCOUNTER — Ambulatory Visit
Admission: RE | Admit: 2017-07-21 | Discharge: 2017-07-21 | Disposition: A | Discharge status: Home / Self Care | Payer: Medicare Other | Source: Ambulatory Visit | Attending: Neurological Surgery | Admitting: Neurological Surgery

## 2017-07-21 DIAGNOSIS — M48062 Spinal stenosis, lumbar region with neurogenic claudication: Secondary | ICD-10-CM

## 2017-07-21 MED ORDER — GADOBENATE DIMEGLUMINE 529 MG/ML IV SOLN
20.0000 mL | Freq: Once | INTRAVENOUS | Status: AC | PRN
  Administered 2017-07-21: 20 mL via INTRAVENOUS

## 2017-08-02 ENCOUNTER — Other Ambulatory Visit: Payer: Self-pay | Admitting: Neurological Surgery

## 2017-08-06 ENCOUNTER — Other Ambulatory Visit (HOSPITAL_COMMUNITY): Payer: Self-pay | Admitting: Neurological Surgery

## 2017-08-06 DIAGNOSIS — M48062 Spinal stenosis, lumbar region with neurogenic claudication: Secondary | ICD-10-CM

## 2017-08-11 ENCOUNTER — Ambulatory Visit: Payer: Medicare Other | Attending: Nurse Practitioner | Admitting: Nurse Practitioner

## 2017-08-11 ENCOUNTER — Ambulatory Visit (HOSPITAL_COMMUNITY): Payer: Medicare Other

## 2017-08-11 ENCOUNTER — Other Ambulatory Visit: Payer: Self-pay

## 2017-08-11 ENCOUNTER — Encounter: Payer: Self-pay | Admitting: Nurse Practitioner

## 2017-08-11 VITALS — BP 153/70 | HR 63 | Temp 98.2°F | Resp 18 | Ht 70.0 in | Wt 252.0 lb

## 2017-08-11 DIAGNOSIS — M5106 Intervertebral disc disorders with myelopathy, lumbar region: Secondary | ICD-10-CM | POA: Diagnosis not present

## 2017-08-11 DIAGNOSIS — M545 Low back pain: Secondary | ICD-10-CM | POA: Diagnosis not present

## 2017-08-11 DIAGNOSIS — G894 Chronic pain syndrome: Secondary | ICD-10-CM | POA: Insufficient documentation

## 2017-08-11 DIAGNOSIS — Z760 Encounter for issue of repeat prescription: Secondary | ICD-10-CM | POA: Diagnosis not present

## 2017-08-11 DIAGNOSIS — M4716 Other spondylosis with myelopathy, lumbar region: Secondary | ICD-10-CM | POA: Insufficient documentation

## 2017-08-11 DIAGNOSIS — M4820 Kissing spine, site unspecified: Secondary | ICD-10-CM

## 2017-08-11 DIAGNOSIS — M47816 Spondylosis without myelopathy or radiculopathy, lumbar region: Secondary | ICD-10-CM

## 2017-08-11 DIAGNOSIS — Z9049 Acquired absence of other specified parts of digestive tract: Secondary | ICD-10-CM | POA: Insufficient documentation

## 2017-08-11 DIAGNOSIS — Z79899 Other long term (current) drug therapy: Secondary | ICD-10-CM | POA: Insufficient documentation

## 2017-08-11 DIAGNOSIS — Z7984 Long term (current) use of oral hypoglycemic drugs: Secondary | ICD-10-CM | POA: Insufficient documentation

## 2017-08-11 DIAGNOSIS — Z9889 Other specified postprocedural states: Secondary | ICD-10-CM | POA: Diagnosis not present

## 2017-08-11 DIAGNOSIS — Z5181 Encounter for therapeutic drug level monitoring: Secondary | ICD-10-CM | POA: Diagnosis present

## 2017-08-11 DIAGNOSIS — M4826 Kissing spine, lumbar region: Secondary | ICD-10-CM | POA: Diagnosis not present

## 2017-08-11 DIAGNOSIS — F17210 Nicotine dependence, cigarettes, uncomplicated: Secondary | ICD-10-CM | POA: Diagnosis not present

## 2017-08-11 MED ORDER — OXYCODONE HCL 5 MG PO TABS: 5.0000 mg | ORAL_TABLET | Freq: Four times a day (QID) | ORAL | 0 refills | Status: DC | PRN

## 2017-08-11 NOTE — Patient Instructions (Signed)
____________________________________________________________________________________________  Medication Rules  Applies to: All patients receiving prescriptions (written or electronic).  Pharmacy of record: Pharmacy where electronic prescriptions will be sent. If written prescriptions are taken to a different pharmacy, please inform the nursing staff. The pharmacy listed in the electronic medical record should be the one where you would like electronic prescriptions to be sent.  Prescription refills: Only during scheduled appointments. Applies to both, written and electronic prescriptions.  NOTE: The following applies primarily to controlled substances (Opioid* Pain Medications).   Patient's responsibilities: 1. Pain Pills: Bring all pain pills to every appointment (except for procedure appointments). 2. Pill Bottles: Bring pills in original pharmacy bottle. Always bring newest bottle. Bring bottle, even if empty. 3. Medication refills: You are responsible for knowing and keeping track of what medications you need refilled. The day before your appointment, write a list of all prescriptions that need to be refilled. Bring that list to your appointment and give it to the admitting nurse. Prescriptions will be written only during appointments. If you forget a medication, it will not be "Called in", "Faxed", or "electronically sent". You will need to get another appointment to get these prescribed. 4. Prescription Accuracy: You are responsible for carefully inspecting your prescriptions before leaving our office. Have the discharge nurse carefully go over each prescription with you, before taking them home. Make sure that your name is accurately spelled, that your address is correct. Check the name and dose of your medication to make sure it is accurate. Check the number of pills, and the written instructions to make sure they are clear and accurate. Make sure that you are given enough medication to last  until your next medication refill appointment. 5. Taking Medication: Take medication as prescribed. Never take more pills than instructed. Never take medication more frequently than prescribed. Taking less pills or less frequently is permitted and encouraged, when it comes to controlled substances (written prescriptions).  6. Inform other Doctors: Always inform, all of your healthcare providers, of all the medications you take. 7. Pain Medication from other Providers: You are not allowed to accept any additional pain medication from any other Doctor or Healthcare provider. There are two exceptions to this rule. (see below) In the event that you require additional pain medication, you are responsible for notifying us, as stated below. 8. Medication Agreement: You are responsible for carefully reading and following our Medication Agreement. This must be signed before receiving any prescriptions from our practice. Safely store a copy of your signed Agreement. Violations to the Agreement will result in no further prescriptions. (Additional copies of our Medication Agreement are available upon request.) 9. Laws, Rules, & Regulations: All patients are expected to follow all Federal and State Laws, Statutes, Rules, & Regulations. Ignorance of the Laws does not constitute a valid excuse. The use of any illegal substances is prohibited. 10. Adopted CDC guidelines & recommendations: Target dosing levels will be at or below 60 MME/day. Use of benzodiazepines** is not recommended.  Exceptions: There are only two exceptions to the rule of not receiving pain medications from other Healthcare Providers. 1. Exception #1 (Emergencies): In the event of an emergency (i.e.: accident requiring emergency care), you are allowed to receive additional pain medication. However, you are responsible for: As soon as you are able, call our office [phone], at any time of the day or night, and leave a message stating your name, the  date and nature of the emergency, and the name and dose of the medication   prescribed. In the event that your call is answered by a member of our staff, make sure to document and save the date, time, and the name of the person that took your information.  2. Exception #2 (Planned Surgery): In the event that you are scheduled by another doctor or dentist to have any type of surgery or procedure, you are allowed (for a period no longer than 30 days), to receive additional pain medication, for the acute post-op pain. However, in this case, you are responsible for picking up a copy of our "Post-op Pain Management for Surgeons" handout, and giving it to your surgeon or dentist. This document is available at our office, and does not require an appointment to obtain it. Simply go to our office during business hours (Monday-Thursday from 8:00 AM to 4:00 PM) (Friday 8:00 AM to 12:00 Noon) or if you have a scheduled appointment with [place], prior to your surgery, and ask for it by name. In addition, you will need to provide [place] with your name, name of your surgeon, type of surgery, and date of procedure or surgery.  *Opioid medications include: morphine, codeine, oxycodone, oxymorphone, hydrocodone, hydromorphone, meperidine, tramadol, tapentadol, buprenorphine, fentanyl, methadone. **Benzodiazepine medications include: diazepam (Valium), alprazolam (Xanax), clonazepam (Klonopine), lorazepam (Ativan), clorazepate (Tranxene), chlordiazepoxide (Librium), estazolam (Prosom), oxazepam (Serax), temazepam (Restoril), triazolam (Halcion) (Last updated: 05/20/2017) ____________________________________________________________________________________________   BMI Assessment: Estimated body mass index is 36.16 kg/m as calculated from the following:   Height as of this encounter: 5\' 10"  (1.778 m).   Weight as of this encounter: 252 lb (114.3 kg).  BMI interpretation table: BMI level Category Range association with higher  incidence of chronic pain  <18 kg/m2 Underweight   18.5-24.9 kg/m2 Ideal body weight   25-29.9 kg/m2 Overweight Increased incidence by 20%  30-34.9 kg/m2 Obese (Class I) Increased incidence by 68%  35-39.9 kg/m2 Severe obesity (Class II) Increased incidence by 136%  >40 kg/m2 Extreme obesity (Class III) Increased incidence by 254%   Patient's current BMI Ideal Body weight  Body mass index is 36.16 kg/m. Ideal body weight: 73 kg (160 lb 15 oz) Adjusted ideal body weight: 89.5 kg (197 lb 5.8 oz)   BMI Readings from Last 4 Encounters:  08/11/17 36.16 kg/m  07/06/17 37.63 kg/m  06/22/17 36.59 kg/m  05/28/17 35.87 kg/m   Wt Readings from Last 4 Encounters:  08/11/17 252 lb (114.3 kg)  07/06/17 262 lb 4 oz (119 kg)  06/22/17 255 lb (115.7 kg)  05/28/17 250 lb (113.4 kg)

## 2017-08-11 NOTE — Progress Notes (Signed)
Nursing Pain Medication Assessment:  Safety precautions to be maintained throughout the outpatient stay will include: orient to surroundings, keep bed in low position, maintain call bell within reach at all times, provide assistance with transfer out of bed and ambulation.  Medication Inspection Compliance: Pill count conducted under aseptic conditions, in front of the patient. Neither the pills nor the bottle was removed from the patient's sight at any time. Once count was completed pills were immediately returned to the patient in their original bottle.  Medication: Oxycodone IR Pill/Patch Count: 109 of 120 pills remain Pill/Patch Appearance: Markings consistent with prescribed medication Bottle Appearance: Standard pharmacy container. Clearly labeled. Filled Date: 05 / 20 / 2019   Last Medication intake:  Today   Safety precautions to be maintained throughout the outpatient stay will include: orient to surroundings, keep bed in low position, maintain call bell within reach at all times, provide assistance with transfer out of bed and ambulation.

## 2017-08-11 NOTE — Progress Notes (Signed)
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 intra-articular Hyalganknee injection  Diagnostic left Genicular nerve block Possible left Genicular nerve radiofrequencyablation  Possible intrathecal opioid trial   Palliative PRN treatment(s): None at this time     Provider-requested follow-up: Return in about 3 months (around 11/11/2017) for MedMgmt with Me Donella Stade Edison Pace).  Future Appointments  Date Time Provider [place]  11/10/2017  8:30 AM Vevelyn Francois, NP [facility] None   Primary Care Physician: Tracie Harrier, MD Location: [facility] Outpatient [facility] Management Facility Note by: Vevelyn Francois NP Date: 08/11/2017; Time: 3:19 AM  Pain Score Disclaimer: We use the NRS-11 scale. This is a self-reported, subjective measurement of pain severity with only modest accuracy. It is used primarily to identify changes within a particular patient. It  must be understood that outpatient pain scales are significantly less accurate that those used for research, where they can be applied under ideal controlled circumstances with minimal exposure to variables. In reality, the score is likely to be a combination of pain intensity and pain affect, where pain affect describes the degree of emotional arousal or changes in action readiness caused by the sensory experience of pain. Factors such as social and work situation, setting, emotional state, anxiety levels, expectation, and prior pain experience may influence pain perception and show large inter-individual differences that may also be affected by time variables.  Patient instructions provided during this appointment: Patient Instructions   ____________________________________________________________________________________________  Medication Rules  Applies to: All patients receiving prescriptions (written or electronic).  Pharmacy of record: Pharmacy where electronic prescriptions will be sent. If written prescriptions are taken to a different pharmacy, please inform the nursing staff. The pharmacy listed in the electronic medical record should be the one where you would like electronic prescriptions to be sent.  Prescription refills: Only during scheduled appointments. Applies to both, written and electronic prescriptions.  NOTE: The following applies primarily to controlled substances (Opioid* Pain Medications).   Patient's responsibilities: 1. Pain Pills: Bring all pain pills to every appointment (except for procedure appointments). 2. Pill Bottles: Bring pills in original pharmacy bottle. Always bring newest bottle. Bring bottle, even if empty. 3. Medication refills: You are responsible for knowing and keeping track of what medications you need refilled. The day before your appointment, write a list of all prescriptions that need to be refilled. Bring that list to your appointment and give  it to the admitting nurse. Prescriptions will be written only during appointments. If you forget a medication, it will not be "Called in", "Faxed", or "electronically sent". You will need to get another appointment to get these prescribed. 4. Prescription  Accuracy: You are responsible for carefully inspecting your prescriptions before leaving our office. Have the discharge nurse carefully go over each prescription with you, before taking them home. Make sure that your name is accurately spelled, that your address is correct. Check the name and dose of your medication to make sure it is accurate. Check the number of pills, and the written instructions to make sure they are clear and accurate. Make sure that you are given enough medication to last until your next medication refill appointment. 5. Taking Medication: Take medication as prescribed. Never take more pills than instructed. Never take medication more frequently than prescribed. Taking less pills or less frequently is permitted and encouraged, when it comes to controlled substances (written prescriptions).  6. Inform other Doctors: Always inform, all of your healthcare providers, of all the medications you take. 7. Pain Medication from other Providers: You are not allowed to accept any additional pain medication from any other Doctor or Healthcare provider. There are two exceptions to this rule. (see below) In the event that you require additional pain medication, you are responsible for notifying us, as stated below. 8. Medication Agreement: You are responsible for carefully reading and following our Medication Agreement. This must be signed before receiving any prescriptions from our practice. Safely store a copy of your signed Agreement. Violations to the Agreement will result in no further prescriptions. (Additional copies of our Medication Agreement are available upon request.) 9. Laws, Rules, & Regulations: All patients are expected to follow all  Federal and [employer], [employer], Rules, [employer]. Ignorance of the Laws does not constitute a valid excuse. The use of any illegal substances is prohibited. 10. Adopted CDC guidelines & recommendations: Target dosing levels will be at or below 60 MME/day. Use of benzodiazepines** is not recommended.  Exceptions: There are only two exceptions to the rule of not receiving pain medications from other Healthcare Providers. 1. Exception #1 (Emergencies): In the event of an emergency (i.e.: accident requiring emergency care), you are allowed to receive additional pain medication. However, you are responsible for: As soon as you are able, call our office [phone], at any time of the day or night, and leave a message stating your name, the date and nature of the emergency, and the name and dose of the medication prescribed. In the event that your call is answered by a member of our staff, make sure to document and save the date, time, and the name of the person that took your information.  2. Exception #2 (Planned Surgery): In the event that you are scheduled by another doctor or dentist to have any type of surgery or procedure, you are allowed (for a period no longer than 30 days), to receive additional pain medication, for the acute post-op pain. However, in this case, you are responsible for picking up a copy of our "Post-op Pain Management for Surgeons" handout, and giving it to your surgeon or dentist. This document is available at our office, and does not require an appointment to obtain it. Simply go to our office during business hours (Monday-Thursday from 8:00 AM to 4:00 PM) (Friday 8:00 AM to 12:00 Noon) or if you have a scheduled appointment with [place], prior to your surgery, and ask for it by name. In addition, you will need to provide [place] with your name, name of your surgeon, type of surgery, and date of procedure or surgery.  *Opioid medications include: morphine, codeine, oxycodone,  oxymorphone, hydrocodone, hydromorphone, meperidine, tramadol, tapentadol, buprenorphine,  fentanyl, methadone. **Benzodiazepine medications include: diazepam (Valium), alprazolam (Xanax), clonazepam (Klonopine), lorazepam (Ativan), clorazepate (Tranxene), chlordiazepoxide (Librium), estazolam (Prosom), oxazepam (Serax), temazepam (Restoril), triazolam (Halcion) (Last updated: 05/20/2017) ____________________________________________________________________________________________   BMI Assessment: Estimated body mass index is 36.16 kg/m as calculated from the following:   Height as of this encounter: _0  (1.778 m).   Weight as of this encounter: 252 lb (114.3 kg).  BMI interpretation table: BMI level Category Range association with higher incidence of chronic pain  <18 kg/m2 Underweight   18.5-24.9 kg/m2 Ideal body weight   25-29.9 kg/m2 Overweight Increased incidence by 20%  30-34.9 kg/m2 Obese (Class I) Increased incidence by 68%  35-39.9 kg/m2 Severe obesity (Class II) Increased incidence by 136%  >40 kg/m2 Extreme obesity (Class III) Increased incidence by 254%   Patient's current BMI Ideal Body weight  Body mass index is 36.16 kg/m. Ideal body weight: 73 kg (160 lb 15 oz) Adjusted ideal body weight: 89.5 kg (197 lb 5.8 oz)   BMI Readings from Last 4 Encounters:  08/11/17 36.16 kg/m  07/06/17 37.63 kg/m  06/22/17 36.59 kg/m  05/28/17 35.87 kg/m   Wt Readings from Last 4 Encounters:  08/11/17 252 lb (114.3 kg)  07/06/17 262 lb 4 oz (119 kg)  06/22/17 255 lb (115.7 kg)  05/28/17 250 lb (113.4 kg)

## 2017-08-12 ENCOUNTER — Other Ambulatory Visit: Payer: Self-pay

## 2017-08-12 ENCOUNTER — Encounter (HOSPITAL_COMMUNITY): Payer: Self-pay

## 2017-08-12 ENCOUNTER — Encounter (HOSPITAL_COMMUNITY)
Admission: RE | Admit: 2017-08-12 | Discharge: 2017-08-12 | Disposition: A | Discharge status: Home / Self Care | Payer: Medicare Other | Source: Ambulatory Visit | Attending: Neurological Surgery | Admitting: Neurological Surgery

## 2017-08-12 ENCOUNTER — Ambulatory Visit (HOSPITAL_COMMUNITY)
Admission: RE | Admit: 2017-08-12 | Discharge: 2017-08-12 | Disposition: A | Discharge status: Home / Self Care | Payer: Medicare Other | Source: Ambulatory Visit | Attending: Neurological Surgery | Admitting: Neurological Surgery

## 2017-08-12 DIAGNOSIS — M48062 Spinal stenosis, lumbar region with neurogenic claudication: Secondary | ICD-10-CM | POA: Insufficient documentation

## 2017-08-12 DIAGNOSIS — Z9889 Other specified postprocedural states: Secondary | ICD-10-CM | POA: Diagnosis not present

## 2017-08-12 DIAGNOSIS — M5136 Other intervertebral disc degeneration, lumbar region: Secondary | ICD-10-CM | POA: Insufficient documentation

## 2017-08-12 DIAGNOSIS — M48061 Spinal stenosis, lumbar region without neurogenic claudication: Secondary | ICD-10-CM | POA: Diagnosis not present

## 2017-08-12 HISTORY — DX: Frequency of micturition: R35.0

## 2017-08-12 HISTORY — DX: Chronic or unspecified peptic ulcer, site unspecified, with hemorrhage: K27.4

## 2017-08-12 HISTORY — DX: Chronic or unspecified gastrojejunal ulcer with hemorrhage: K28.4

## 2017-08-12 LAB — BASIC METABOLIC PANEL
Anion gap: 10 (ref 5–15)
BUN: 12 mg/dL (ref 6–20)
CO2: 27 mmol/L (ref 22–32)
Calcium: 9.1 mg/dL (ref 8.9–10.3)
Chloride: 98 mmol/L — ABNORMAL LOW (ref 101–111)
Creatinine, Ser: 1.01 mg/dL (ref 0.61–1.24)
GFR calc Af Amer: >60 mL/min (ref >60)
GFR calc non Af Amer: >60 mL/min (ref >60)
Glucose, Bld: 129 mg/dL — ABNORMAL HIGH (ref 65–99)
Potassium: 4.6 mmol/L (ref 3.5–5.1)
Sodium: 135 mmol/L (ref 135–145)

## 2017-08-12 LAB — TYPE AND SCREEN
ABO/RH(D): O POS
Antibody Screen: NEGATIVE

## 2017-08-12 LAB — HEMOGLOBIN A1C
Hgb A1c MFr Bld: 7.8 % — ABNORMAL HIGH (ref 4.8–5.6)
Mean Plasma Glucose: 177.16 mg/dL

## 2017-08-12 LAB — SURGICAL PCR SCREEN
MRSA, PCR: NEGATIVE
Staphylococcus aureus: POSITIVE — AB

## 2017-08-12 LAB — CBC
HCT: 41.4 % (ref 39.0–52.0)
Hemoglobin: 13.1 g/dL (ref 13.0–17.0)
MCH: 28.4 pg (ref 26.0–34.0)
MCHC: 31.6 g/dL (ref 30.0–36.0)
MCV: 89.8 fL (ref 78.0–100.0)
Platelets: 274 10*3/uL (ref 150–400)
RBC: 4.61 MIL/uL (ref 4.22–5.81)
RDW: 13.8 % (ref 11.5–15.5)
WBC: 8.2 10*3/uL (ref 4.0–10.5)

## 2017-08-12 LAB — GLUCOSE, CAPILLARY: Glucose-Capillary: 146 mg/dL — ABNORMAL HIGH (ref 65–99)

## 2017-08-12 NOTE — Pre-Procedure Instructions (Addendum)
Howard Maxwell  08/12/2017      [place], [place] [facility] [address] [place] [address] Phone: [phone] Fax: [phone]  [facility] [place], [place] [address] Phone: [phone] Fax: [phone]    Your procedure is scheduled on  Monday 08/23/17  Report to [place] at 530 A.M.  Call this number if you have problems the morning of surgery:  [phone]   Remember:  No food OR DRINK after midnight.    Take these medicines the morning of surgery with A SIP OF WATER - CARVEDILOL (COREG), EYE DROPS, OMEPRAZOLE (PRILOSEC), OXYCODONE IF NEEDED, POTASSIUM   7 days prior to surgery STOP taking any Aspirin(unless otherwise instructed by your surgeon), Aleve, Naproxen, Ibuprofen, Motrin, Advil, Goody's, BC's, all herbal medications, fish oil, and all vitamins    How to Manage Your Diabetes Before and After Surgery  Why is it important to control my blood sugar before and after surgery? . Improving blood sugar levels before and after surgery helps healing and can limit problems. . A way of improving blood sugar control is eating a healthy diet by: o  Eating less sugar and carbohydrates o  Increasing activity/exercise o  Talking with your doctor about reaching your blood sugar goals . High blood sugars (greater than 180 mg/dL) can raise your risk of infections and slow your recovery, so you will need to focus on controlling your diabetes during the weeks before surgery. . Make sure that the doctor who takes care of your diabetes knows about your planned surgery including the date and location.  How do I manage my blood sugar before surgery? . Check your blood sugar at least 4 times a day, starting 2 days before surgery, to make sure that the level is not too high or low. o Check your blood sugar the morning of your surgery when you wake up  and every 2 hours until you get to the Short Stay unit. . If your blood sugar is less than 70 mg/dL, you will need to treat for low blood sugar: o Do not take insulin. o Treat a low blood sugar (less than 70 mg/dL) with  cup of clear juice (cranberry or apple), 4 glucose tablets, OR glucose gel. Recheck blood sugar in 15 minutes after treatment (to make sure it is greater than 70 mg/dL). If your blood sugar is not greater than 70 mg/dL on recheck, call [phone] o  for further instructions. . Report your blood sugar to the short stay nurse when you get to Short Stay.  . If you are admitted to the hospital after surgery: o Your blood sugar will be checked by the staff and you will probably be given insulin after surgery (instead of oral diabetes medicines) to make sure you have good blood sugar levels. o The goal for blood sugar control after surgery is 80-180 mg/dL.              WHAT DO I DO ABOUT MY DIABETES MEDICATION?   Marland Kitchen Do not take oral diabetes medicines (pills) the morning of surgery.  . THE NIGHT BEFORE SURGERY, take ___________ units of ___________insulin.       . THE MORNING OF SURGERY, take _____________ units of __________insulin.  . The day of surgery, do not take other diabetes injectables, including Byetta (exenatide), Bydureon (exenatide ER), Victoza (liraglutide), or  Trulicity (dulaglutide).  . If your CBG is greater than 220 mg/dL, you may take  of your sliding scale (correction) dose of insulin.  Other Instructions:          Patient Signature:  Date:   Nurse Signature:  Date:   Reviewed and Endorsed by [facility] Patient Education Committee, August 2015  Do not wear jewelry, make-up or nail polish.  Do not wear lotions, powders, or perfumes, or deodorant.  Do not shave 48 hours prior to surgery.  Men may shave face and neck.  Do not bring valuables to the hospital.  [facility] is not responsible for any belongings or  valuables.  Contacts, dentures or bridgework may not be worn into surgery.  Leave your suitcase in the car.  After surgery it may be brought to your room.  For patients admitted to the hospital, discharge time will be determined by your treatment team.  Patients discharged the day of surgery will not be allowed to drive home.   Name and phone number of your driver:   Special instructions:  [facility] - Preparing for Surgery  Before surgery, you can play an important role.  Because skin is not sterile, your skin needs to be as free of germs as possible.  You can reduce the number of germs on you skin by washing with CHG (chlorahexidine gluconate) soap before surgery.  CHG is an antiseptic cleaner which kills germs and bonds with the skin to continue killing germs even after washing.  Oral Hygiene is also important in reducing the risk of infection.  Remember to brush your teeth with your regular toothpaste the morning of surgery.  Please DO NOT use if you have an allergy to CHG or antibacterial soaps.  If your skin becomes reddened/irritated stop using the CHG and inform your nurse when you arrive at Short Stay.  Do not shave (including legs and underarms) for at least 48 hours prior to the first CHG shower.  You may shave your face.  Please follow these instructions carefully:   1.  Shower with CHG Soap the night before surgery and the morning of Surgery.  2.  If you choose to wash your hair, wash your hair first as usual with your normal shampoo.  3.  After you shampoo, rinse your hair and body thoroughly to remove the shampoo. 4.  Use CHG as you would any other liquid soap.  You can apply chg directly to the skin and wash gently with a      scrungie or washcloth.           5.  Apply the CHG Soap to your body ONLY FROM THE NECK DOWN.   Do not use on open wounds or open sores. Avoid contact with your eyes, ears, mouth and genitals (private parts).  Wash genitals (private parts) with your  normal soap.  6.  Wash thoroughly, paying special attention to the area where your surgery will be performed.  7.  Thoroughly rinse your body with warm water from the neck down.  8.  DO NOT shower/wash with your normal soap after using and rinsing off the CHG Soap.  9.  Pat yourself dry with a clean towel.            10.  Wear clean pajamas.            11.  Place clean sheets on your bed the night of your first shower and do not sleep with pets.  Day of Surgery  Do not apply any lotions/deoderants the morning of surgery.   Please wear clean clothes to the hospital/surgery center. Remember to brush your teeth with toothpaste.     Please read over the following fact sheets that you were given. MRSA Information and Surgical Site Infection Prevention

## 2017-08-12 NOTE — Progress Notes (Signed)
Mupirocin Ointment Rx called into [facility] in [place] for positive PCR of Staph. Notified pt and he voiced understanding.

## 2017-08-13 ENCOUNTER — Inpatient Hospital Stay (HOSPITAL_COMMUNITY): Admission: RE | Admit: 2017-08-13 | Payer: Medicare Other | Source: Ambulatory Visit

## 2017-08-13 ENCOUNTER — Ambulatory Visit (HOSPITAL_COMMUNITY): Payer: Medicare Other
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
## 2017-08-20 MED ORDER — DEXTROSE 5 % IV SOLN
3.0000 g | INTRAVENOUS | Status: AC
  Administered 2017-08-23 (×2): 3 g via INTRAVENOUS
  Filled 2017-08-20: qty 3

## 2017-08-23 ENCOUNTER — Other Ambulatory Visit: Payer: Self-pay

## 2017-08-23 ENCOUNTER — Inpatient Hospital Stay (HOSPITAL_COMMUNITY): Payer: Medicare Other

## 2017-08-23 ENCOUNTER — Inpatient Hospital Stay (HOSPITAL_COMMUNITY): Payer: Medicare Other | Admitting: Emergency Medicine

## 2017-08-23 ENCOUNTER — Inpatient Hospital Stay (HOSPITAL_COMMUNITY): Payer: Medicare Other | Admitting: Anesthesiology

## 2017-08-23 ENCOUNTER — Inpatient Hospital Stay (HOSPITAL_COMMUNITY)
Admission: RE | Admit: 2017-08-23 | Discharge: 2017-08-29 | DRG: 458 | Disposition: A | Discharge status: Home / Self Care | Payer: Medicare Other | Source: Ambulatory Visit | Attending: Neurological Surgery | Admitting: Neurological Surgery

## 2017-08-23 ENCOUNTER — Encounter (HOSPITAL_COMMUNITY): Payer: Self-pay | Admitting: *Deleted

## 2017-08-23 ENCOUNTER — Encounter (HOSPITAL_COMMUNITY)
Admission: RE | Disposition: A | Discharge status: Home / Self Care | Payer: Self-pay | Source: Ambulatory Visit | Attending: Neurological Surgery

## 2017-08-23 DIAGNOSIS — I10 Essential (primary) hypertension: Secondary | ICD-10-CM | POA: Diagnosis present

## 2017-08-23 DIAGNOSIS — H5462 Unqualified visual loss, left eye, normal vision right eye: Secondary | ICD-10-CM | POA: Diagnosis present

## 2017-08-23 DIAGNOSIS — M25551 Pain in right hip: Secondary | ICD-10-CM | POA: Diagnosis not present

## 2017-08-23 DIAGNOSIS — J449 Chronic obstructive pulmonary disease, unspecified: Secondary | ICD-10-CM | POA: Diagnosis present

## 2017-08-23 DIAGNOSIS — G4730 Sleep apnea, unspecified: Secondary | ICD-10-CM | POA: Diagnosis present

## 2017-08-23 DIAGNOSIS — I252 Old myocardial infarction: Secondary | ICD-10-CM | POA: Diagnosis not present

## 2017-08-23 DIAGNOSIS — Z6837 Body mass index (BMI) 37.0-37.9, adult: Secondary | ICD-10-CM | POA: Diagnosis not present

## 2017-08-23 DIAGNOSIS — Z87891 Personal history of nicotine dependence: Secondary | ICD-10-CM

## 2017-08-23 DIAGNOSIS — E669 Obesity, unspecified: Secondary | ICD-10-CM | POA: Diagnosis present

## 2017-08-23 DIAGNOSIS — E119 Type 2 diabetes mellitus without complications: Secondary | ICD-10-CM | POA: Diagnosis present

## 2017-08-23 DIAGNOSIS — I2510 Atherosclerotic heart disease of native coronary artery without angina pectoris: Secondary | ICD-10-CM | POA: Diagnosis present

## 2017-08-23 DIAGNOSIS — G8929 Other chronic pain: Secondary | ICD-10-CM | POA: Diagnosis present

## 2017-08-23 DIAGNOSIS — M4726 Other spondylosis with radiculopathy, lumbar region: Secondary | ICD-10-CM | POA: Diagnosis present

## 2017-08-23 DIAGNOSIS — M4156 Other secondary scoliosis, lumbar region: Secondary | ICD-10-CM | POA: Diagnosis present

## 2017-08-23 DIAGNOSIS — Z419 Encounter for procedure for purposes other than remedying health state, unspecified: Secondary | ICD-10-CM

## 2017-08-23 DIAGNOSIS — K219 Gastro-esophageal reflux disease without esophagitis: Secondary | ICD-10-CM | POA: Diagnosis present

## 2017-08-23 DIAGNOSIS — M48062 Spinal stenosis, lumbar region with neurogenic claudication: Secondary | ICD-10-CM | POA: Diagnosis present

## 2017-08-23 HISTORY — PX: ANTERIOR LATERAL LUMBAR FUSION 4 LEVELS: SHX5552

## 2017-08-23 HISTORY — PX: APPLICATION OF ROBOTIC ASSISTANCE FOR SPINAL PROCEDURE: SHX6753

## 2017-08-23 HISTORY — PX: LUMBAR PERCUTANEOUS PEDICLE SCREW 4 LEVEL: SHX6318

## 2017-08-23 LAB — GLUCOSE, CAPILLARY
Glucose-Capillary: 173 mg/dL — ABNORMAL HIGH (ref 65–99)
Glucose-Capillary: 161 mg/dL — ABNORMAL HIGH (ref 65–99)
Glucose-Capillary: 172 mg/dL — ABNORMAL HIGH (ref 65–99)

## 2017-08-23 SURGERY — ANTERIOR LATERAL LUMBAR FUSION 4 LEVELS
Anesthesia: General | Site: Spine Lumbar

## 2017-08-23 MED ORDER — OXYCODONE HCL 5 MG PO TABS
ORAL_TABLET | ORAL | Status: AC
  Administered 2017-08-23: 10 mg via ORAL
  Filled 2017-08-23: qty 2

## 2017-08-23 MED ORDER — CARVEDILOL 6.25 MG PO TABS
6.2500 mg | ORAL_TABLET | Freq: Two times a day (BID) | ORAL | Status: DC
  Administered 2017-08-23 – 2017-08-29 (×11): 6.25 mg via ORAL
  Filled 2017-08-23 (×12): qty 1

## 2017-08-23 MED ORDER — CHLORHEXIDINE GLUCONATE CLOTH 2 % EX PADS: 6.0000 | MEDICATED_PAD | Freq: Once | CUTANEOUS | Status: DC

## 2017-08-23 MED ORDER — MORPHINE SULFATE (PF) 4 MG/ML IV SOLN
4.0000 mg | INTRAVENOUS | Status: DC | PRN
  Administered 2017-08-23 – 2017-08-28 (×23): 4 mg via INTRAVENOUS
  Filled 2017-08-23 (×24): qty 1

## 2017-08-23 MED ORDER — NEOMYCIN-POLYMYXIN-DEXAMETH 0.1 % OP OINT
2.0000 "application " | TOPICAL_OINTMENT | Freq: Every day | OPHTHALMIC | Status: DC | PRN
  Filled 2017-08-23 (×2): qty 3.5

## 2017-08-23 MED ORDER — 0.9 % SODIUM CHLORIDE (POUR BTL) OPTIME
TOPICAL | Status: DC | PRN
  Administered 2017-08-23: 1000 mL

## 2017-08-23 MED ORDER — KETOROLAC TROMETHAMINE 15 MG/ML IJ SOLN
INTRAMUSCULAR | Status: AC
  Administered 2017-08-23: 15 mg via INTRAVENOUS
  Filled 2017-08-23: qty 1

## 2017-08-23 MED ORDER — MAGNESIUM OXIDE 400 (241.3 MG) MG PO TABS
400.0000 mg | ORAL_TABLET | Freq: Two times a day (BID) | ORAL | Status: DC
  Administered 2017-08-23 – 2017-08-29 (×12): 400 mg via ORAL
  Filled 2017-08-23 (×12): qty 1

## 2017-08-23 MED ORDER — LIDOCAINE-EPINEPHRINE 1 %-1:100000 IJ SOLN
INTRAMUSCULAR | Status: AC
  Filled 2017-08-23: qty 1

## 2017-08-23 MED ORDER — LACTATED RINGERS IV SOLN
INTRAVENOUS | Status: DC
  Administered 2017-08-23 (×2): via INTRAVENOUS

## 2017-08-23 MED ORDER — LACTATED RINGERS IV SOLN
INTRAVENOUS | Status: DC | PRN
  Administered 2017-08-23 (×4): via INTRAVENOUS

## 2017-08-23 MED ORDER — PROPOFOL 10 MG/ML IV BOLUS
INTRAVENOUS | Status: AC
  Filled 2017-08-23: qty 20

## 2017-08-23 MED ORDER — SODIUM CHLORIDE 0.9 % IV SOLN
0.1000 mg/kg/h | INTRAVENOUS | Status: DC
  Filled 2017-08-23 (×2): qty 2

## 2017-08-23 MED ORDER — DEXAMETHASONE SODIUM PHOSPHATE 10 MG/ML IJ SOLN
INTRAMUSCULAR | Status: DC | PRN
  Administered 2017-08-23: 5 mg via INTRAVENOUS

## 2017-08-23 MED ORDER — MIDAZOLAM HCL 5 MG/5ML IJ SOLN
INTRAMUSCULAR | Status: DC | PRN
  Administered 2017-08-23 (×2): 1 mg via INTRAVENOUS

## 2017-08-23 MED ORDER — ONDANSETRON HCL 4 MG/2ML IJ SOLN
INTRAMUSCULAR | Status: DC | PRN
  Administered 2017-08-23: 4 mg via INTRAVENOUS

## 2017-08-23 MED ORDER — FENTANYL CITRATE (PF) 250 MCG/5ML IJ SOLN
INTRAMUSCULAR | Status: AC
  Filled 2017-08-23: qty 5

## 2017-08-23 MED ORDER — PROPOFOL 10 MG/ML IV BOLUS
INTRAVENOUS | Status: DC | PRN
  Administered 2017-08-23: 110 mg via INTRAVENOUS

## 2017-08-23 MED ORDER — POTASSIUM 99 MG PO TABS: 99.0000 mg | ORAL_TABLET | Freq: Every day | ORAL | Status: DC

## 2017-08-23 MED ORDER — KETOROLAC TROMETHAMINE 15 MG/ML IJ SOLN
15.0000 mg | Freq: Once | INTRAMUSCULAR | Status: AC
Start: 2017-08-23 — End: 2017-08-23
  Administered 2017-08-23: 15 mg via INTRAVENOUS

## 2017-08-23 MED ORDER — MAGNESIUM OXIDE -MG SUPPLEMENT 500 MG PO CAPS: 1.0000 | ORAL_CAPSULE | Freq: Two times a day (BID) | ORAL | Status: DC

## 2017-08-23 MED ORDER — HYDROCHLOROTHIAZIDE 12.5 MG PO CAPS
12.5000 mg | ORAL_CAPSULE | Freq: Every day | ORAL | Status: DC
  Administered 2017-08-23 – 2017-08-29 (×7): 12.5 mg via ORAL
  Filled 2017-08-23 (×7): qty 1

## 2017-08-23 MED ORDER — ROCURONIUM BROMIDE 100 MG/10ML IV SOLN
INTRAVENOUS | Status: DC | PRN
  Administered 2017-08-23: 50 mg via INTRAVENOUS
  Administered 2017-08-23: 30 mg via INTRAVENOUS
  Administered 2017-08-23: 20 mg via INTRAVENOUS

## 2017-08-23 MED ORDER — SUCCINYLCHOLINE CHLORIDE 20 MG/ML IJ SOLN
INTRAMUSCULAR | Status: DC | PRN
  Administered 2017-08-23: 80 mg via INTRAVENOUS

## 2017-08-23 MED ORDER — GUAIFENESIN 200 MG PO TABS
400.0000 mg | ORAL_TABLET | ORAL | Status: DC
  Administered 2017-08-23 – 2017-08-29 (×32): 400 mg via ORAL
  Filled 2017-08-23 (×38): qty 2

## 2017-08-23 MED ORDER — SUFENTANIL CITRATE 250 MCG/5ML IV SOLN: 2.0000 ug/kg | Freq: Once | INTRAVENOUS | Status: DC

## 2017-08-23 MED ORDER — SODIUM CHLORIDE 0.9 % IV SOLN: 250.0000 mL | INTRAVENOUS | Status: DC

## 2017-08-23 MED ORDER — DOCUSATE SODIUM 100 MG PO CAPS
100.0000 mg | ORAL_CAPSULE | Freq: Two times a day (BID) | ORAL | Status: DC
  Administered 2017-08-23 – 2017-08-29 (×12): 100 mg via ORAL
  Filled 2017-08-23 (×12): qty 1

## 2017-08-23 MED ORDER — GLYCOPYRROLATE 0.2 MG/ML IJ SOLN
INTRAMUSCULAR | Status: DC | PRN
  Administered 2017-08-23 (×2): 0.2 mg via INTRAVENOUS

## 2017-08-23 MED ORDER — THROMBIN 20000 UNITS EX SOLR
CUTANEOUS | Status: AC
  Filled 2017-08-23: qty 20000

## 2017-08-23 MED ORDER — THROMBIN 5000 UNITS EX SOLR
OROMUCOSAL | Status: DC | PRN
  Administered 2017-08-23: 5 mL via TOPICAL

## 2017-08-23 MED ORDER — ONDANSETRON HCL 4 MG/2ML IJ SOLN: 4.0000 mg | Freq: Four times a day (QID) | INTRAMUSCULAR | Status: DC | PRN

## 2017-08-23 MED ORDER — METHOCARBAMOL 1000 MG/10ML IJ SOLN
500.0000 mg | Freq: Four times a day (QID) | INTRAVENOUS | Status: DC | PRN
  Filled 2017-08-23: qty 5

## 2017-08-23 MED ORDER — FLEET ENEMA 7-19 GM/118ML RE ENEM: 1.0000 | ENEMA | Freq: Once | RECTAL | Status: DC | PRN

## 2017-08-23 MED ORDER — ALBUMIN HUMAN 5 % IV SOLN
INTRAVENOUS | Status: DC | PRN
  Administered 2017-08-23 (×2): via INTRAVENOUS

## 2017-08-23 MED ORDER — MENTHOL 3 MG MT LOZG: 1.0000 | LOZENGE | OROMUCOSAL | Status: DC | PRN

## 2017-08-23 MED ORDER — GLIPIZIDE ER 10 MG PO TB24
10.0000 mg | ORAL_TABLET | Freq: Every day | ORAL | Status: DC
  Administered 2017-08-24 – 2017-08-29 (×6): 10 mg via ORAL
  Filled 2017-08-23 (×7): qty 1

## 2017-08-23 MED ORDER — ONDANSETRON HCL 4 MG/2ML IJ SOLN
INTRAMUSCULAR | Status: AC
  Filled 2017-08-23: qty 2

## 2017-08-23 MED ORDER — DIAZEPAM 5 MG/ML IJ SOLN
5.0000 mg | Freq: Four times a day (QID) | INTRAMUSCULAR | Status: DC | PRN
  Administered 2017-08-23 – 2017-08-26 (×4): 5 mg via INTRAVENOUS
  Filled 2017-08-23 (×4): qty 2

## 2017-08-23 MED ORDER — METHOCARBAMOL 500 MG PO TABS
500.0000 mg | ORAL_TABLET | Freq: Four times a day (QID) | ORAL | Status: DC | PRN
  Administered 2017-08-23: 500 mg via ORAL

## 2017-08-23 MED ORDER — FENTANYL CITRATE (PF) 250 MCG/5ML IJ SOLN
INTRAMUSCULAR | Status: DC | PRN
  Administered 2017-08-23: 100 ug via INTRAVENOUS
  Administered 2017-08-23: 50 ug via INTRAVENOUS
  Administered 2017-08-23: 100 ug via INTRAVENOUS
  Administered 2017-08-23: 50 ug via INTRAVENOUS
  Administered 2017-08-23 (×3): 100 ug via INTRAVENOUS
  Administered 2017-08-23: 50 ug via INTRAVENOUS

## 2017-08-23 MED ORDER — NEOMYCIN-POLYMYXIN-DEXAMETH 3.5-10000-0.1 OP OINT
TOPICAL_OINTMENT | Freq: Every day | OPHTHALMIC | Status: DC | PRN
  Filled 2017-08-23: qty 3.5

## 2017-08-23 MED ORDER — BUPIVACAINE HCL (PF) 0.5 % IJ SOLN
INTRAMUSCULAR | Status: DC | PRN
  Administered 2017-08-23 (×2): 10 mL

## 2017-08-23 MED ORDER — OXYCODONE HCL 5 MG PO TABS
5.0000 mg | ORAL_TABLET | ORAL | Status: DC | PRN
  Administered 2017-08-25: 5 mg via ORAL
  Filled 2017-08-23: qty 1

## 2017-08-23 MED ORDER — PHENOL 1.4 % MT LIQD: 1.0000 | OROMUCOSAL | Status: DC | PRN

## 2017-08-23 MED ORDER — OXYCODONE HCL 5 MG PO TABS: 5.0000 mg | ORAL_TABLET | Freq: Once | ORAL | Status: DC | PRN

## 2017-08-23 MED ORDER — OXYCODONE HCL 5 MG/5ML PO SOLN: 5.0000 mg | Freq: Once | ORAL | Status: DC | PRN

## 2017-08-23 MED ORDER — BISACODYL 10 MG RE SUPP: 10.0000 mg | Freq: Every day | RECTAL | Status: DC | PRN

## 2017-08-23 MED ORDER — LIDOCAINE HCL (CARDIAC) PF 100 MG/5ML IV SOSY
PREFILLED_SYRINGE | INTRAVENOUS | Status: DC | PRN
  Administered 2017-08-23: 80 mg via INTRATRACHEAL

## 2017-08-23 MED ORDER — PHENYLEPHRINE 40 MCG/ML (10ML) SYRINGE FOR IV PUSH (FOR BLOOD PRESSURE SUPPORT)
PREFILLED_SYRINGE | INTRAVENOUS | Status: AC
  Filled 2017-08-23: qty 10

## 2017-08-23 MED ORDER — DEXAMETHASONE SODIUM PHOSPHATE 10 MG/ML IJ SOLN
INTRAMUSCULAR | Status: AC
  Filled 2017-08-23: qty 1

## 2017-08-23 MED ORDER — FENTANYL CITRATE (PF) 100 MCG/2ML IJ SOLN
INTRAMUSCULAR | Status: AC
  Administered 2017-08-23: 50 ug via INTRAVENOUS
  Filled 2017-08-23: qty 2

## 2017-08-23 MED ORDER — NAPHAZOLINE-GLYCERIN 0.012-0.2 % OP SOLN
1.0000 [drp] | Freq: Four times a day (QID) | OPHTHALMIC | Status: DC | PRN
  Filled 2017-08-23: qty 15

## 2017-08-23 MED ORDER — POTASSIUM CHLORIDE CRYS ER 10 MEQ PO TBCR
10.0000 meq | EXTENDED_RELEASE_TABLET | Freq: Every day | ORAL | Status: DC
  Administered 2017-08-23 – 2017-08-29 (×7): 10 meq via ORAL
  Filled 2017-08-23 (×7): qty 1

## 2017-08-23 MED ORDER — CEFAZOLIN SODIUM-DEXTROSE 2-4 GM/100ML-% IV SOLN
2.0000 g | Freq: Three times a day (TID) | INTRAVENOUS | Status: AC
  Administered 2017-08-23 – 2017-08-24 (×2): 2 g via INTRAVENOUS
  Filled 2017-08-23 (×2): qty 100

## 2017-08-23 MED ORDER — OXYCODONE HCL 5 MG PO TABS
10.0000 mg | ORAL_TABLET | ORAL | Status: DC | PRN
  Administered 2017-08-23 – 2017-08-29 (×34): 10 mg via ORAL
  Filled 2017-08-23 (×33): qty 2

## 2017-08-23 MED ORDER — BUPIVACAINE HCL (PF) 0.5 % IJ SOLN
INTRAMUSCULAR | Status: AC
  Filled 2017-08-23: qty 30

## 2017-08-23 MED ORDER — SUFENTANIL CITRATE 250 MCG/5ML IV SOLN
0.2500 ug/kg/h | INTRAVENOUS | Status: DC
  Administered 2017-08-23: .15 ug/kg/h via INTRAVENOUS
  Filled 2017-08-23 (×2): qty 5

## 2017-08-23 MED ORDER — METFORMIN HCL 850 MG PO TABS
850.0000 mg | ORAL_TABLET | Freq: Two times a day (BID) | ORAL | Status: DC
  Administered 2017-08-23 – 2017-08-29 (×11): 850 mg via ORAL
  Filled 2017-08-23 (×14): qty 1

## 2017-08-23 MED ORDER — DEXTROSE 5 % IV SOLN
3.0000 g | INTRAVENOUS | Status: DC
  Filled 2017-08-23: qty 3000

## 2017-08-23 MED ORDER — SODIUM CHLORIDE 0.9% FLUSH
3.0000 mL | Freq: Two times a day (BID) | INTRAVENOUS | Status: DC
  Administered 2017-08-23 – 2017-08-29 (×12): 3 mL via INTRAVENOUS

## 2017-08-23 MED ORDER — THROMBIN 5000 UNITS EX SOLR
CUTANEOUS | Status: AC
  Filled 2017-08-23: qty 5000

## 2017-08-23 MED ORDER — FENTANYL CITRATE (PF) 250 MCG/5ML IJ SOLN
INTRAMUSCULAR | Status: AC
  Filled 2017-08-23: qty 10

## 2017-08-23 MED ORDER — PROPOFOL 1000 MG/100ML IV EMUL
INTRAVENOUS | Status: AC
  Filled 2017-08-23: qty 100

## 2017-08-23 MED ORDER — LOSARTAN POTASSIUM-HCTZ 100-12.5 MG PO TABS: 1.0000 | ORAL_TABLET | Freq: Every day | ORAL | Status: DC

## 2017-08-23 MED ORDER — POLYETHYLENE GLYCOL 3350 17 G PO PACK: 17.0000 g | PACK | Freq: Every day | ORAL | Status: DC | PRN

## 2017-08-23 MED ORDER — FENTANYL CITRATE (PF) 100 MCG/2ML IJ SOLN
25.0000 ug | INTRAMUSCULAR | Status: DC | PRN
  Administered 2017-08-23: 25 ug via INTRAVENOUS
  Administered 2017-08-23: 50 ug via INTRAVENOUS
  Administered 2017-08-23: 25 ug via INTRAVENOUS

## 2017-08-23 MED ORDER — SODIUM CHLORIDE 0.9 % IV SOLN
INTRAVENOUS | Status: DC | PRN
  Administered 2017-08-23: 500 mL

## 2017-08-23 MED ORDER — KETOROLAC TROMETHAMINE 15 MG/ML IJ SOLN
15.0000 mg | Freq: Four times a day (QID) | INTRAMUSCULAR | Status: AC
  Administered 2017-08-23 – 2017-08-24 (×5): 15 mg via INTRAVENOUS
  Filled 2017-08-23 (×5): qty 1

## 2017-08-23 MED ORDER — METHOCARBAMOL 500 MG PO TABS
ORAL_TABLET | ORAL | Status: AC
  Administered 2017-08-23: 500 mg via ORAL
  Filled 2017-08-23: qty 1

## 2017-08-23 MED ORDER — PHENYLEPHRINE HCL 10 MG/ML IJ SOLN
INTRAVENOUS | Status: DC | PRN
  Administered 2017-08-23: 25 ug/min via INTRAVENOUS

## 2017-08-23 MED ORDER — ACETAMINOPHEN 325 MG PO TABS
650.0000 mg | ORAL_TABLET | ORAL | Status: DC | PRN
  Administered 2017-08-24 – 2017-08-26 (×7): 650 mg via ORAL
  Filled 2017-08-23 (×7): qty 2

## 2017-08-23 MED ORDER — POLYETHYL GLYCOL-PROPYL GLYCOL 0.4-0.3 % OP SOLN: 1.0000 [drp] | Freq: Every day | OPHTHALMIC | Status: DC | PRN

## 2017-08-23 MED ORDER — GUAIFENESIN 400 MG PO TABS: 400.0000 mg | ORAL_TABLET | ORAL | Status: DC

## 2017-08-23 MED ORDER — EPHEDRINE SULFATE 50 MG/ML IJ SOLN
INTRAMUSCULAR | Status: DC | PRN
  Administered 2017-08-23 (×3): 5 mg via INTRAVENOUS

## 2017-08-23 MED ORDER — SUGAMMADEX SODIUM 200 MG/2ML IV SOLN
INTRAVENOUS | Status: DC | PRN
  Administered 2017-08-23: 200 mg via INTRAVENOUS

## 2017-08-23 MED ORDER — SUGAMMADEX SODIUM 500 MG/5ML IV SOLN
INTRAVENOUS | Status: AC
  Filled 2017-08-23: qty 5

## 2017-08-23 MED ORDER — ONDANSETRON HCL 4 MG PO TABS: 4.0000 mg | ORAL_TABLET | Freq: Four times a day (QID) | ORAL | Status: DC | PRN

## 2017-08-23 MED ORDER — ACETAMINOPHEN 650 MG RE SUPP: 650.0000 mg | RECTAL | Status: DC | PRN

## 2017-08-23 MED ORDER — PANTOPRAZOLE SODIUM 40 MG PO TBEC
80.0000 mg | DELAYED_RELEASE_TABLET | Freq: Every day | ORAL | Status: DC
  Administered 2017-08-27: 40 mg via ORAL
  Filled 2017-08-23 (×5): qty 2

## 2017-08-23 MED ORDER — SENNA 8.6 MG PO TABS
1.0000 | ORAL_TABLET | Freq: Two times a day (BID) | ORAL | Status: DC
  Administered 2017-08-23 – 2017-08-29 (×12): 8.6 mg via ORAL
  Filled 2017-08-23 (×12): qty 1

## 2017-08-23 MED ORDER — SUCCINYLCHOLINE CHLORIDE 200 MG/10ML IV SOSY
PREFILLED_SYRINGE | INTRAVENOUS | Status: AC
  Filled 2017-08-23: qty 10

## 2017-08-23 MED ORDER — POLYVINYL ALCOHOL 1.4 % OP SOLN
1.0000 [drp] | OPHTHALMIC | Status: DC | PRN
  Filled 2017-08-23: qty 15

## 2017-08-23 MED ORDER — LIDOCAINE 2% (20 MG/ML) 5 ML SYRINGE
INTRAMUSCULAR | Status: AC
  Filled 2017-08-23: qty 15

## 2017-08-23 MED ORDER — SPIRONOLACTONE 25 MG PO TABS
25.0000 mg | ORAL_TABLET | Freq: Every day | ORAL | Status: DC
  Administered 2017-08-23 – 2017-08-29 (×7): 25 mg via ORAL
  Filled 2017-08-23 (×7): qty 1

## 2017-08-23 MED ORDER — SODIUM CHLORIDE 0.9 % IV SOLN
INTRAVENOUS | Status: DC | PRN
  Administered 2017-08-23: 14:00:00 via INTRAVENOUS
  Administered 2017-08-23 (×2): 5 ug/kg/min via INTRAVENOUS

## 2017-08-23 MED ORDER — ROCURONIUM BROMIDE 50 MG/5ML IV SOLN
INTRAVENOUS | Status: AC
  Filled 2017-08-23: qty 3

## 2017-08-23 MED ORDER — SODIUM CHLORIDE 0.9% FLUSH: 3.0000 mL | INTRAVENOUS | Status: DC | PRN

## 2017-08-23 MED ORDER — ALUM & MAG HYDROXIDE-SIMETH 200-200-20 MG/5ML PO SUSP: 30.0000 mL | Freq: Four times a day (QID) | ORAL | Status: DC | PRN

## 2017-08-23 MED ORDER — ATORVASTATIN CALCIUM 40 MG PO TABS
40.0000 mg | ORAL_TABLET | Freq: Every day | ORAL | Status: DC
  Administered 2017-08-23 – 2017-08-29 (×7): 40 mg via ORAL
  Filled 2017-08-23 (×7): qty 1

## 2017-08-23 MED ORDER — FLUOCINONIDE 0.05 % EX GEL
1.0000 "application " | Freq: Every day | CUTANEOUS | Status: DC | PRN
  Filled 2017-08-23 (×2): qty 15

## 2017-08-23 MED ORDER — MIDAZOLAM HCL 2 MG/2ML IJ SOLN
INTRAMUSCULAR | Status: AC
  Filled 2017-08-23: qty 2

## 2017-08-23 MED ORDER — LOSARTAN POTASSIUM 50 MG PO TABS
100.0000 mg | ORAL_TABLET | Freq: Every day | ORAL | Status: DC
  Administered 2017-08-23 – 2017-08-29 (×4): 100 mg via ORAL
  Filled 2017-08-23 (×5): qty 2

## 2017-08-23 MED ORDER — EPHEDRINE SULFATE 50 MG/ML IJ SOLN
INTRAMUSCULAR | Status: AC
  Filled 2017-08-23: qty 1

## 2017-08-23 MED ORDER — SENNOSIDES-DOCUSATE SODIUM 8.6-50 MG PO TABS: 1.0000 | ORAL_TABLET | ORAL | Status: DC

## 2017-08-23 MED ORDER — LIDOCAINE-EPINEPHRINE 1 %-1:100000 IJ SOLN
INTRAMUSCULAR | Status: DC | PRN
  Administered 2017-08-23 (×2): 10 mL

## 2017-08-23 MED ORDER — PROPOFOL 500 MG/50ML IV EMUL
INTRAVENOUS | Status: DC | PRN
  Administered 2017-08-23: 50 ug/kg/min via INTRAVENOUS
  Administered 2017-08-23 (×2): via INTRAVENOUS

## 2017-08-23 MED ORDER — TIZANIDINE HCL 4 MG PO TABS
6.0000 mg | ORAL_TABLET | Freq: Two times a day (BID) | ORAL | Status: DC
  Administered 2017-08-23 – 2017-08-25 (×5): 6 mg via ORAL
  Administered 2017-08-26: 4 mg via ORAL
  Administered 2017-08-26 – 2017-08-29 (×6): 6 mg via ORAL
  Filled 2017-08-23 (×12): qty 1

## 2017-08-23 SURGICAL SUPPLY — 89 items
ADH SKN CLS APL DERMABOND .7 (GAUZE/BANDAGES/DRESSINGS) ×4
BAG DECANTER FOR FLEXI CONT (MISCELLANEOUS) ×2 IMPLANT
BIT DRILL LONG 3.0X30 (BIT) IMPLANT
BIT DRILL LONG 3X80 (BIT) IMPLANT
BIT DRILL LONG 4X80 (BIT) IMPLANT
BIT DRILL SHORT 3.0X30 (BIT) IMPLANT
BIT DRILL SHORT 3X80 (BIT) IMPLANT
BLADE 10 SAFETY STRL DISP (BLADE) ×1 IMPLANT
BLADE CLIPPER SURG (BLADE) ×1 IMPLANT
BLADE SURG 11 STRL SS (BLADE) ×1 IMPLANT
BONE CANC CHIPS 40CC CAN1/2 (Bone Implant) ×2 IMPLANT
CAGE MODULUS XL 12X18X55 - 10 (Cage) ×1 IMPLANT
CARTRIDGE OIL MAESTRO DRILL (MISCELLANEOUS) ×1 IMPLANT
CHIPS CANC BONE 40CC CAN1/2 (Bone Implant) ×1 IMPLANT
CONT SPEC 4OZ CLIKSEAL STRL BL (MISCELLANEOUS) ×1 IMPLANT
COVER BACK TABLE 24X17X13 BIG (DRAPES) IMPLANT
COVER BACK TABLE 60X90IN (DRAPES) ×2 IMPLANT
DERMABOND ADVANCED (GAUZE/BANDAGES/DRESSINGS) ×4
DERMABOND ADVANCED .7 DNX12 (GAUZE/BANDAGES/DRESSINGS) ×3 IMPLANT
DIFFUSER DRILL AIR PNEUMATIC (MISCELLANEOUS) ×1 IMPLANT
DRAPE C-ARM 42X72 X-RAY (DRAPES) ×5 IMPLANT
DRAPE C-ARMOR (DRAPES) ×4 IMPLANT
DRAPE LAPAROTOMY 100X72X124 (DRAPES) ×4 IMPLANT
DRAPE POUCH INSTRU U-SHP 10X18 (DRAPES) ×1 IMPLANT
DRAPE SHEET LG 3/4 BI-LAMINATE (DRAPES) ×2 IMPLANT
DRSG OPSITE POSTOP 4X8 (GAUZE/BANDAGES/DRESSINGS) ×2 IMPLANT
DURAPREP 26ML APPLICATOR (WOUND CARE) ×4 IMPLANT
ELECT BLADE 4.0 EZ CLEAN MEGAD (MISCELLANEOUS) ×4
ELECT REM PT RETURN 9FT ADLT (ELECTROSURGICAL) ×4
ELECTRODE BLDE 4.0 EZ CLN MEGD (MISCELLANEOUS) IMPLANT
ELECTRODE REM PT RTRN 9FT ADLT (ELECTROSURGICAL) ×2 IMPLANT
GAUZE SPONGE 4X4 16PLY XRAY LF (GAUZE/BANDAGES/DRESSINGS) ×2 IMPLANT
GLOVE BIOGEL PI IND STRL 6.5 (GLOVE) IMPLANT
GLOVE BIOGEL PI IND STRL 7.5 (GLOVE) IMPLANT
GLOVE BIOGEL PI IND STRL 8.5 (GLOVE) ×2 IMPLANT
GLOVE BIOGEL PI INDICATOR 6.5 (GLOVE) ×1
GLOVE BIOGEL PI INDICATOR 7.5 (GLOVE) ×2
GLOVE BIOGEL PI INDICATOR 8.5 (GLOVE) ×2
GLOVE ECLIPSE 8.5 STRL (GLOVE) ×4 IMPLANT
GLOVE ECLIPSE 9.0 STRL (GLOVE) ×1 IMPLANT
GLOVE EXAM NITRILE LRG STRL (GLOVE) IMPLANT
GLOVE EXAM NITRILE XL STR (GLOVE) IMPLANT
GLOVE EXAM NITRILE XS STR PU (GLOVE) IMPLANT
GLOVE SURG SS PI 6.0 STRL IVOR (GLOVE) ×2 IMPLANT
GLOVE SURG SS PI 7.0 STRL IVOR (GLOVE) ×3 IMPLANT
GOWN STRL REUS W/ TWL LRG LVL3 (GOWN DISPOSABLE) IMPLANT
GOWN STRL REUS W/ TWL XL LVL3 (GOWN DISPOSABLE) ×2 IMPLANT
GOWN STRL REUS W/TWL 2XL LVL3 (GOWN DISPOSABLE) ×6 IMPLANT
GOWN STRL REUS W/TWL LRG LVL3 (GOWN DISPOSABLE) ×6
GOWN STRL REUS W/TWL XL LVL3 (GOWN DISPOSABLE) ×2
GRAFT BNE CHIP CANC 1-8 40 (Bone Implant) IMPLANT
GUIDEWIRE NITINOL BEVEL TIP (WIRE) ×10 IMPLANT
HEMOSTAT POWDER SURGIFOAM 1G (HEMOSTASIS) ×1 IMPLANT
KIT BASIN OR (CUSTOM PROCEDURE TRAY) ×3 IMPLANT
KIT DILATOR XLIF 5 (KITS) ×1 IMPLANT
KIT INFUSE SMALL (Orthopedic Implant) ×1 IMPLANT
KIT SPINE MAZOR X ROBO DISP (MISCELLANEOUS) ×2 IMPLANT
KIT SURGICAL ACCESS MAXCESS 4 (KITS) ×1 IMPLANT
KIT TURNOVER KIT B (KITS) ×2 IMPLANT
MARKER SKIN DUAL TIP RULER LAB (MISCELLANEOUS) ×2 IMPLANT
MODULE EMG NDL SSEP NVM5 (NEEDLE) IMPLANT
MODULE EMG NEEDLE SSEP NVM5 (NEEDLE) ×2 IMPLANT
MODULE NVM5 NEXT GEN EMG (NEEDLE) ×1 IMPLANT
MODULUS XLW 12X22X50MM 10DEG (Spine Construct) ×1 IMPLANT
MODULUS XLW 12X22X55MM 10 (Spine Construct) ×2 IMPLANT
NDL HYPO 25X1 1.5 SAFETY (NEEDLE) ×2 IMPLANT
NEEDLE HYPO 25X1 1.5 SAFETY (NEEDLE) ×4 IMPLANT
NS IRRIG 1000ML POUR BTL (IV SOLUTION) ×3 IMPLANT
OIL CARTRIDGE MAESTRO DRILL (MISCELLANEOUS)
PACK LAMINECTOMY NEURO (CUSTOM PROCEDURE TRAY) ×4 IMPLANT
PAD ARMBOARD 7.5X6 YLW CONV (MISCELLANEOUS) ×6 IMPLANT
PIN HEAD 2.5X60MM (PIN) IMPLANT
ROD RELINE LORDOTIC 5.5X140 (Rod) ×2 IMPLANT
SCREW LOCK RELINE 5.5 TULIP (Screw) ×10 IMPLANT
SCREW MAS RELINE 6.5X45 POLY (Screw) ×4 IMPLANT
SCREW MAS RELINE 6.5X50 POLY (Screw) ×4 IMPLANT
SCREW RELINE MAS 7.5X50MM POLY (Screw) ×2 IMPLANT
SCREW SCHANZ SA 4.0MM (MISCELLANEOUS) IMPLANT
SPONGE LAP 4X18 RFD (DISPOSABLE) IMPLANT
SPONGE SURGIFOAM ABS GEL SZ50 (HEMOSTASIS) IMPLANT
SUT VIC AB 1 CT1 18XBRD ANBCTR (SUTURE) ×1 IMPLANT
SUT VIC AB 1 CT1 8-18 (SUTURE)
SUT VIC AB 2-0 CP2 18 (SUTURE) ×9 IMPLANT
SUT VIC AB 3-0 SH 8-18 (SUTURE) ×10 IMPLANT
TOWEL GREEN STERILE (TOWEL DISPOSABLE) ×3 IMPLANT
TOWEL GREEN STERILE FF (TOWEL DISPOSABLE) ×3 IMPLANT
TRAY FOLEY MTR SLVR 16FR STAT (SET/KITS/TRAYS/PACK) ×3 IMPLANT
TUBE MAZOR SA REDUCTION (TUBING) ×2 IMPLANT
WATER STERILE IRR 1000ML POUR (IV SOLUTION) ×3 IMPLANT

## 2017-08-23 NOTE — Anesthesia Procedure Notes (Signed)
Arterial Line Insertion Start/End6/05/2017 7:41 AM, 08/23/2017 7:46 AM Performed by: Oleta Mouse, MD  Patient location: OR. Preanesthetic checklist: patient identified, IV checked, site marked, risks and benefits discussed, surgical consent, monitors and equipment checked, pre-op evaluation, timeout performed and anesthesia consent Lidocaine 1% used for infiltration Right, radial was placed Catheter size: 20 G Hand hygiene performed  and maximum sterile barriers used   Attempts: 1 Procedure performed without using ultrasound guided technique. Following insertion, dressing applied. Post procedure assessment: normal and unchanged  Patient tolerated the procedure well with no immediate complications.

## 2017-08-23 NOTE — Anesthesia Preprocedure Evaluation (Signed)
Anesthesia Evaluation  Patient identified by MRN, date of birth, ID band Patient awake    Reviewed: Allergy & Precautions, H&P , NPO status , Patient's Chart, lab work & pertinent test results, reviewed documented beta blocker date and time   History of Anesthesia Complications Negative for: history of anesthetic complications  Airway Mallampati: IV  TM Distance: <3 FB Neck ROM: limited    Dental  (+) Chipped, Teeth Intact   Pulmonary neg shortness of breath, sleep apnea , COPD, former smoker,    breath sounds clear to auscultation       Cardiovascular Exercise Tolerance: Good hypertension, Pt. on medications and Pt. on home beta blockers (-) angina+ CAD and + Past MI   Rhythm:Regular     Neuro/Psych  Neuromuscular disease negative psych ROS   GI/Hepatic Neg liver ROS, PUD, GERD  Medicated and Controlled,  Endo/Other  diabetes, Type 2Morbid obesity  Renal/GU negative Renal ROS  negative genitourinary   Musculoskeletal  (+) Arthritis ,   Abdominal   Peds  Hematology negative hematology ROS (+)   Anesthesia Other Findings   Reproductive/Obstetrics negative OB ROS                             Anesthesia Physical Anesthesia Plan  ASA: III  Anesthesia Plan: General   Post-op Pain Management:    Induction: Intravenous  PONV Risk Score and Plan: 1 and Ondansetron and Dexamethasone  Airway Management Planned: Oral ETT  Additional Equipment: Arterial line  Intra-op Plan:   Post-operative Plan: Extubation in OR and Possible Post-op intubation/ventilation  Informed Consent: I have reviewed the patients History and Physical, chart, labs and discussed the procedure including the risks, benefits and alternatives for the proposed anesthesia with the patient or authorized representative who has indicated his/her understanding and acceptance.   Dental advisory given  Plan Discussed with:  CRNA and Surgeon  Anesthesia Plan Comments: (Ketamine/sufenta gtt, propofol gtt if monitoring)        Anesthesia Quick Evaluation

## 2017-08-23 NOTE — Anesthesia Procedure Notes (Addendum)
Procedure Name: Intubation Date/Time: 08/23/2017 7:50 AM Performed by: Mariea Clonts, CRNA Pre-anesthesia Checklist: Patient identified, Emergency Drugs available, Suction available and Patient being monitored Patient Re-evaluated:Patient Re-evaluated prior to induction Oxygen Delivery Method: Circle System Utilized Preoxygenation: Pre-oxygenation with 100% oxygen Induction Type: IV induction Ventilation: Mask ventilation without difficulty Laryngoscope Size: Glidescope and 4 Grade View: Grade I Tube type: Oral Tube size: 8.0 mm Number of attempts: 1 Airway Equipment and Method: Stylet and Oral airway Placement Confirmation: ETT inserted through vocal cords under direct vision,  positive ETCO2 and breath sounds checked- equal and bilateral Tube secured with: Tape Dental Injury: Teeth and Oropharynx as per pre-operative assessment

## 2017-08-23 NOTE — Transfer of Care (Signed)
Immediate Anesthesia Transfer of Care Note  Patient: Howard Maxwell  Procedure(s) Performed: Anterolateral Decompression, arthrodesis - Lumbar One-Two, Lumbar Two-Three, Lumbar Three-Four, Lumbar Four-Five Percutaneous posterior fixation (N/A Spine Lumbar) LUMBAR PERCUTANEOUS PEDICLE SCREW 4 LEVEL (N/A Spine Lumbar) APPLICATION OF ROBOTIC ASSISTANCE FOR SPINAL PROCEDURE (N/A Spine Lumbar)  Patient Location: PACU  Anesthesia Type:General  Level of Consciousness: awake, alert  and oriented  Airway & Oxygen Therapy: Patient Spontanous Breathing, Patient connected to nasal cannula oxygen and Patient connected to face mask oxygen  Post-op Assessment: Report given to RN, Post -op Vital signs reviewed and stable and Patient moving all extremities X 4  Post vital signs: Reviewed and stable  Last Vitals:  Vitals Value Taken Time  BP 153/77 08/23/2017  2:50 PM  Temp    Pulse 77 08/23/2017  2:53 PM  Resp 14 08/23/2017  2:53 PM  SpO2 93 % 08/23/2017  2:53 PM  Vitals shown include unvalidated device data.  Last Pain:  Vitals:   08/23/17 0613  TempSrc:   PainSc: 4       Patients Stated Pain Goal: 3 (01/60/10 9323)  Complications: No apparent anesthesia complications

## 2017-08-23 NOTE — Anesthesia Procedure Notes (Signed)
Arterial Line Insertion Start/End6/05/2017 7:56 AM, 08/23/2017 7:57 AM Performed by: anesthesiologist  Patient location: OR. Preanesthetic checklist: patient identified, IV checked, site marked, risks and benefits discussed, surgical consent, monitors and equipment checked, pre-op evaluation, timeout performed and anesthesia consent Right, radial was placed Catheter size: 20 G Hand hygiene performed  and maximum sterile barriers used   Attempts: 2 Procedure performed without using ultrasound guided technique. Following insertion, dressing applied and Biopatch. Post procedure assessment: normal  Patient tolerated the procedure well with no immediate complications.
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
## 2017-08-23 NOTE — Op Note (Signed)
Date of surgery: 08/23/2017 Preoperative diagnosis: Lumbar stenosis with neurogenic claudication and lumbar radiculopathy, chronic back pain Postoperative diagnosis: Same Procedure: Anterolateral indirect decompression L1-L2 3 L3-4 L4-5 using ex lift technique and titanium spacers.  Arthrodesis with allograft and infuse L1-L2 3 L3-4 L4-5.  Posterior percutaneous segmental fixation L1-L5 with robotically guided placement of pedicle screws L1-L5.  Neuro monitoring during the anterolateral decompression. Surgeon: [name] First assistant: Deri Fuelling, MD Anesthesia: General endotracheal Indications: Howard Maxwell is a 72 year old individuals had significant lumbar radiculopathy with chronic back pain and leg pain.  He has evidence of severe spondylosis and stenosis at L1-L2 3 L3-4 L4-5.  He is advised regarding the need for surgical decompression and stabilization using an indirect decompression technique.  Procedure: The patient was brought to the operating room supine on the stretcher.  After the smooth induction of general endotracheal anesthesia, he had monitoring of the L2-L3-L4 and L5 musculature performed with both EMG and somatosensory evoked potentials he was placed in the right lateral decubitus position.  The hips were placed just below the break in the table.  Then AP and lateral confirmatory radiographs identified the patient's position orthogonal to the radiographs.  The lateral aspect of the skin was marked over the interspaces at L4 53423 and 1 2.  The back was prepped with alcohol DuraPrep and draped in a sterile fashion then by starting at L4-5 the skin over this area was opened the posterior incision was created in the left flank to allow penetration into the right retroperitoneal space with a blunt forceps.  Then by using finger guidance I was able to guide a tube down to the interspace at L4-L5 through the psoas muscle all the time monitoring for electromyographic disturbance.  When an  appropriate site on the disc space was identified a K wire was passed into the disc space and then a series of dilators were used again monitoring to make sure that no nerve roots were involved near this dissection and dilation.  Ultimately a expandable retractor was placed over this area and fixed to the operating table with a clamp.  Then by further stimulation around the area the nerve roots were identified within the dissection cavity.  The disc space was opened on the lateral aspect with 15 blade combination of curettes and rongeurs was then used to evacuate a substantial quantity of severely degenerated and desiccated disc material.  Respiratory's were used to remove cartilage from the endplates on either side and a series of dilators were then passed to open the space to a 12 mm lordotic configuration with 10 degrees of lordosis.  It was felt that with this reestablishment of height in the disc space and reestablishment of lordosis the foramen would be and were widely open.  With this after assuring that the endplates were well curettaged the space was filled with a titanium spacer measuring 12 x 18 x 55 mm in size with 10 degrees of lordosis at L4-L5.  The spacer was packed with allograft which was morselized cancellus bone along with infuse.  Attention was then turned to L3-4 were similar process was undertaken here a 12 mm x 22 mm x 55 mm size spacer with 10 degrees lordosis was placed L2-3 had a similar dissection performed with the same size 12 x 22 x 55 mm spacer being placed and L1 to allowed placement for a 12 x 22 x 50 mm spacer with 10 degrees lordosis.  As each 1 of the spaces was performed lordosis  in the lumbar spine was reestablished and the foramina were open substantially.  Once all the spacers placed final radiographs were obtained in AP and lateral projection and then the wounds were closed with 2-0 Vicryl in the deep fascia and 3-0 Vicryl subcuticular skin.  Total of 5 incisions were made  for for the individual interspaces and one for the guide finger to be placed in the retroperitoneum.  The patient tolerated this portion very well and was then turned to the prone position on the [place] table for the placement of pedicle screws.  With the back prepped with alcohol DuraPrep and draped in a sterile fashion the robotic arm was attached to the table and secured to the patient with a single shunt screw being placed in the posterior superior iliac spine on the left.  The robot was then registered to the CT scan that had been used for planning by obtaining some orthogonal radiographs.  Once the correlation was made the pedicle entry sites were chosen per the robotic plan and these were verified by review of the placement of each individual screw on the plan.  The robot was then directed to mark the openings for the pedicle entry sites at L1-L5 on both sides and after drilling K wire was placed placed into the each of the pedicles noted.  Radiographs were obtained in the AP and lateral projection to verify the positioning of the wires placed by the robotic guidance.  Then screws were placed into the individual pedicles with 6.5 x 45 mm screws being placed in L1 and L2 6.5 x 50 mm screws being placed in L3 6.5 x 50 mm screws being placed in L4 and 7.5 x 50 mm screws being placed in L5.  Verification of the screw placement was obtained then percutaneous rod placement was performed using a precontoured 140 mm rod on each side.  These were secured in a neutral construct and final radiographs identified good fixation with reduction of the degenerative changes that were noted and reestablishment of lordosis in the sagittal plane and in normal alignment in the coronal plane.  The wounds were then closed with a 2-0 Vicryl in the subcutaneous tissues and 3-0 Vicryl subcuticularly.  Blood loss for this portion of the procedure was estimated at 150 cc.  Patient was returned to recovery room in stable condition.

## 2017-08-24 ENCOUNTER — Encounter (HOSPITAL_COMMUNITY): Payer: Self-pay | Admitting: Neurological Surgery

## 2017-08-24 LAB — CBC
HCT: 33.2 % — ABNORMAL LOW (ref 39.0–52.0)
Hemoglobin: 10.5 g/dL — ABNORMAL LOW (ref 13.0–17.0)
MCH: 28.2 pg (ref 26.0–34.0)
MCHC: 31.6 g/dL (ref 30.0–36.0)
MCV: 89.2 fL (ref 78.0–100.0)
Platelets: 212 10*3/uL (ref 150–400)
RBC: 3.72 MIL/uL — ABNORMAL LOW (ref 4.22–5.81)
RDW: 13.6 % (ref 11.5–15.5)
WBC: 9.8 10*3/uL (ref 4.0–10.5)

## 2017-08-24 LAB — BASIC METABOLIC PANEL
Anion gap: 7 (ref 5–15)
BUN: 12 mg/dL (ref 6–20)
CO2: 28 mmol/L (ref 22–32)
Calcium: 8 mg/dL — ABNORMAL LOW (ref 8.9–10.3)
Chloride: 95 mmol/L — ABNORMAL LOW (ref 101–111)
Creatinine, Ser: 0.86 mg/dL (ref 0.61–1.24)
GFR calc Af Amer: >60 mL/min (ref >60)
GFR calc non Af Amer: >60 mL/min (ref >60)
Glucose, Bld: 198 mg/dL — ABNORMAL HIGH (ref 65–99)
Potassium: 4.8 mmol/L (ref 3.5–5.1)
Sodium: 130 mmol/L — ABNORMAL LOW (ref 135–145)

## 2017-08-24 LAB — GLUCOSE, CAPILLARY: Glucose-Capillary: 161 mg/dL — ABNORMAL HIGH (ref 65–99)

## 2017-08-24 NOTE — Progress Notes (Signed)
Patient ID: Howard Maxwell, male   DOB: 1946-01-15, 72 y.o.   MRN: [id number] Vital signs are stable Patient is having some right hip pain Discomfort probably related to positioning OR table Motor function appears intact Incisions are clean and dry Postoperative labs are stable Hemoglobin is over 10.

## 2017-08-24 NOTE — Evaluation (Signed)
Occupational Therapy Evaluation Patient Details Name: Howard Maxwell MRN: [id number] DOB: 22-Dec-1945 Today's Date: 08/24/2017    History of Present Illness pt is a 72 y/o male with significant lumbar radiculopathy with chronic back and leg pain due to severe spondylosis and stenosis at L1/L2, L2/3, L 3/4, L 4/5, s/p anterolateral decompression with ex lift, segmental fixation and pedical screws L1-L5.  PMH:  neuropathy, HTN, DM, COPD, CAD with MI.   Clinical Impression   Patient is s/p s/p anterolateral decompression with ex lift, segmental fixation and pedical screws L1-L5. surgery resulting in functional limitations due to the deficits listed below (see OT problem list). Pt currently limited by severe pain, pt with decr BP and oxygen desaturation to 87% on RA. Pt requires total (A) for LB at this time.  Patient will benefit from skilled OT acutely to increase independence and safety with ADLS to allow discharge [place]..     Follow Up Recommendations  Home health OT    Equipment Recommendations  3 in 1 bedside commode    Recommendations for Other Services       Precautions / Restrictions Precautions Precautions: Back Precaution Comments: back handout provided  Required Braces or Orthoses: Spinal Brace Spinal Brace: Lumbar corset;Applied in sitting position      Mobility Bed Mobility Overal bed mobility: Needs Assistance Bed Mobility: Rolling;Sidelying to Sit Rolling: Mod assist Sidelying to sit: Mod assist     Sit to sidelying: Mod assist General bed mobility comments: cues for technique, truncal/LE assist throughout  Transfers Overall transfer level: Needs assistance Equipment used: Rolling walker (2 wheeled) Transfers: Sit to/from Stand Sit to Stand: Mod assist;+2 safety/equipment         General transfer comment: cues for hand placement, assist to come forward and up.    Balance Overall balance assessment: No apparent balance deficits (not formally  assessed);Needs assistance Sitting-balance support: No upper extremity supported;Single extremity supported Sitting balance-Leahy Scale: Fair Sitting balance - Comments: assisted in donning the brace.   Standing balance support: Bilateral upper extremity supported Standing balance-Leahy Scale: Poor Standing balance comment: reliant on external support                           ADL either performed or assessed with clinical judgement   ADL Overall ADL's : Needs assistance/impaired Eating/Feeding: Set up;Sitting Eating/Feeding Details (indicate cue type and reason): requires seated position supported Grooming: Oral care;Wash/dry hands;Wash/dry face;Set up;Sitting Grooming Details (indicate cue type and reason): pt did wash his hands after attempting to void bladder at sink height with leaning against sink  Upper Body Bathing: Moderate assistance   Lower Body Bathing: Maximal assistance   Upper Body Dressing : Moderate assistance;Sitting Upper Body Dressing Details (indicate cue type and reason): educated on don doff of back brace. pt needs (A) to don brace at this time. pt able to latch brace and pull the strings Lower Body Dressing: Maximal assistance Lower Body Dressing Details (indicate cue type and reason): unable to cross at this time. pt will benefit from AE Education Toilet Transfer: Minimal assistance;RW;BSC Toilet Transfer Details (indicate cue type and reason): pt able to transfer onto the toilet and attempt to void. pt voiding ~ 25 cc into urinal in bathroom         Functional mobility during ADLs: Minimal assistance;Rolling walker General ADL Comments: pt completed OOB to EOB oral care, progressed to doorway and then bathroom level activity. pt positioned in chair. pt prolonged time  in the bed due to lack of brace present in room. OT located brace in pacu in cabinet prior to session  Back handout provided and reviewed adls in detail. Pt educated on: clothing  between brace, never sleep in brace,  avoid sitting for long periods of time, correct bed positioning for sleeping, correct sequence for bed mobility, avoiding lifting more than 5 pounds and never wash directly over incision.   Vision Baseline Vision/History: Legally blind;Wears glasses Wears Glasses: At all times Patient Visual Report: Other (comment) Additional Comments: pt with L eye blindness and requires 2x of glasses due to deficits. pt reports requiring different glasses after cataract surgery     Perception     Praxis      Pertinent Vitals/Pain Pain Assessment: 0-10 Pain Score: 8  Faces Pain Scale: Hurts whole lot Pain Location: back, r hip Pain Descriptors / Indicators: Aching;Burning;Guarding;Grimacing Pain Intervention(s): Monitored during session;Premedicated before session;Repositioned     Hand Dominance Right   Extremity/Trunk Assessment Upper Extremity Assessment Upper Extremity Assessment: Overall WFL for tasks assessed   Lower Extremity Assessment Lower Extremity Assessment: Defer to PT evaluation   Cervical / Trunk Assessment Cervical / Trunk Assessment: Other exceptions(s/p surg)   Communication Communication Communication: No difficulties   Cognition Arousal/Alertness: Awake/alert Behavior During Therapy: WFL for tasks assessed/performed Overall Cognitive Status: Within Functional Limits for tasks assessed                                     General Comments  educated reinforced with handout    Exercises     Shoulder Instructions      Home Living Family/patient expects to be discharged to:: Private residence Living Arrangements: Spouse/significant other;Alone Available Help at Discharge: Family;Available 24 hours/day Type of Home: House Home Access: Stairs to enter [employer] of Steps: 2 Entrance Stairs-Rails: Right;Left Home Layout: One level     Bathroom Shower/Tub: Teacher, early years/pre:  Handicapped height Bathroom Accessibility: Yes   Home Equipment: Cane - single point          Prior Functioning/Environment Level of Independence: Independent with assistive device(s)        Comments: used cane consistently.  Wife did many of the chores at home        OT Problem List: Decreased strength;Decreased activity tolerance;Impaired balance (sitting and/or standing);Impaired vision/perception;Decreased coordination;Decreased safety awareness;Decreased knowledge of use of DME or AE;Decreased knowledge of precautions;Obesity;Pain      OT Treatment/Interventions: Self-care/ADL training;Therapeutic exercise;DME and/or AE instruction;Therapeutic activities;Patient/family education;Balance training    OT Goals(Current goals can be found in the care plan section) Acute Rehab OT Goals Patient Stated Goal: home independent OT Goal Formulation: With patient Time For Goal Achievement: 09/07/17 Potential to Achieve Goals: Good  OT Frequency: Min 3X/week   Barriers to D/C:            Co-evaluation PT/OT/SLP Co-Evaluation/Treatment: Yes Reason for Co-Treatment: Complexity of the patient's impairments (multi-system involvement);Necessary to address cognition/behavior during functional activity;For patient/therapist safety;To address functional/ADL transfers PT goals addressed during session: Mobility/safety with mobility OT goals addressed during session: ADL's and self-care;Proper use of Adaptive equipment and DME;Strengthening/ROM      AM-PAC PT "6 Clicks" Daily Activity     Outcome Measure Help from another person eating meals?: None Help from another person taking care of personal grooming?: A Little Help from another person toileting, which includes using toliet, bedpan, or urinal?: A  Little Help from another person bathing (including washing, rinsing, drying)?: A Lot Help from another person to put on and taking off regular upper body clothing?: A Lot Help from another  person to put on and taking off regular lower body clothing?: A Lot 6 Click Score: 16   End of Session Equipment Utilized During Treatment: Back brace;Oxygen(2L o2) Nurse Communication: Mobility status;Precautions  Activity Tolerance: Patient tolerated treatment well Patient left: in chair;with call bell/phone within reach  OT Visit Diagnosis: Unsteadiness on feet (R26.81)                Time: 4235-3614 OT Time Calculation (min): 36 min Charges:  OT General Charges $OT Visit: 1 Visit OT Evaluation $OT Eval Moderate Complexity: 1 Mod G-Codes:      Jeri Modena   OTR/L Pager: [phone] Office: [phone] .   Parke Poisson B 08/24/2017, 3:03 PM

## 2017-08-24 NOTE — Progress Notes (Signed)
Physical Therapy Evaluation Patient Details Name: Howard Maxwell MRN: [id number] DOB: 04-29-1945 Today's Date: 08/24/2017   History of Present Illness  pt is a 72 y/o male with significant lumbar radiculopathy with chronic back and leg pain due to severe spondylosis and stenosis at L1/L2, L2/3, L 3/4, L 4/5, s/p anterolateral decompression with ex lift, segmental fixation and pedical screws L1-L5.  PMH:  neuropathy, HTN, DM, COPD, CAD with MI.  Clinical Impression  Pt admitted with/for lumbar fusion surgery.  Pt needing significant assist at this time due to increased pain.  Unable to complete mobility assessment due to brace not here..  Pt currently limited functionally due to the problems listed below.  (see problems list.)  Pt will benefit from PT to maximize function and safety to be able to get home safely with available assist.     Follow Up Recommendations No PT follow up;Other (comment)(don't expect will need follow up, but will address before dc)    Equipment Recommendations  Rolling walker with 5" wheels    Recommendations for Other Services       Precautions / Restrictions Precautions Precautions: Back      Mobility  Bed Mobility Overal bed mobility: Needs Assistance Bed Mobility: Rolling;Sidelying to Sit;Sit to Sidelying Rolling: Mod assist Sidelying to sit: Mod assist     Sit to sidelying: Mod assist General bed mobility comments: cues for technique, truncal/LE assist throughout  Transfers Overall transfer level: Needs assistance   Transfers: Sit to/from Stand Sit to Stand: Mod assist         General transfer comment: cues for hand placement, assist to come forward and up.  Ambulation/Gait   Ambulation Distance (Feet): 2 Feet Assistive device: 1 person hand held assist;None       General Gait Details: 4 sidesteps to Howard Maxwell.  Stairs            Wheelchair Mobility    Modified Rankin (Stroke Patients Only)       Balance Overall balance  assessment: No apparent balance deficits (not formally assessed);Needs assistance   Sitting balance-Howard Maxwell: Fair Sitting balance - Comments: pain limiting time able to sit without UE assist   Standing balance support: Bilateral upper extremity supported Standing balance-Howard Maxwell: Poor Standing balance comment: reliant on external support                             Pertinent Vitals/Pain Pain Assessment: Faces Faces Pain Maxwell: Hurts whole lot Pain Location: back, r hip Pain Descriptors / Indicators: Aching;Burning;Guarding;Grimacing Pain Intervention(s): Limited activity within patient's tolerance;Monitored during session    Home Living Family/patient expects to be discharged to:: Private residence Living Arrangements: Spouse/significant other;Alone Available Help at Discharge: Family;Available 24 hours/day Type of Home: House Home Access: Stairs to enter Entrance Stairs-Rails: Psychiatric nurse of Steps: 2 Home Layout: One level Home Equipment: Cane - single point      Prior Function Level of Independence: Independent with assistive device(s)         Comments: used cane consistently.  Wife did many of the chores at home     Hand Dominance        Extremity/Trunk Assessment        Lower Extremity Assessment Lower Extremity Assessment: Generalized weakness(right more limited due to hip pain')       Communication   Communication: No difficulties  Cognition Arousal/Alertness: Awake/alert Behavior During Therapy: WFL for tasks assessed/performed Overall Cognitive Status: Within Functional  Limits for tasks assessed                                        General Comments General comments (skin integrity, edema, etc.): Found out brace not present when ready to get  OOB, but sat EOB to help pt decrease pain and stiffness.  Will ambulate when brace arrives.  pt and wife instructed in back prec/care,  logrolling/transitions to/from side, lifting restrictions and progression of activity.    Exercises     Assessment/Plan    PT Assessment Patient needs continued PT services  PT Problem List Decreased strength;Decreased activity tolerance;Decreased balance;Decreased mobility;Decreased knowledge of use of DME;Decreased knowledge of precautions;Pain       PT Treatment Interventions Gait training;DME instruction;Functional mobility training;Stair training;Therapeutic activities;Patient/family education    PT Goals (Current goals can be found in the Care Plan section)  Acute Rehab PT Goals Patient Stated Goal: home independent PT Goal Formulation: With patient Time For Goal Achievement: 08/31/17 Potential to Achieve Goals: Good    Frequency Min 5X/week   Barriers to discharge        Co-evaluation               AM-PAC PT "6 Clicks" Daily Activity  Outcome Measure Difficulty turning over in bed (including adjusting bedclothes, sheets and blankets)?: Unable Difficulty moving from lying on back to sitting on the side of the bed? : Unable Difficulty sitting down on and standing up from a chair with arms (e.g., wheelchair, bedside commode, etc,.)?: Unable Help needed moving to and from a bed to chair (including a wheelchair)?: A Lot Help needed walking in Maxwell room?: A Lot Help needed climbing 3-5 steps with a railing? : A Lot 6 Click Score: 9    End of Session   Activity Tolerance: Patient limited by pain Patient left: in bed;with call bell/phone within reach;with family/visitor present Nurse Communication: Mobility status PT Visit Diagnosis: Other abnormalities of gait and mobility (R26.89);Pain Pain - part of body: (back  right hip)    Time: 9179-1505 PT Time Calculation (min) (ACUTE ONLY): 30 min   Charges:   PT Evaluation $PT Eval Moderate Complexity: 1 Mod PT Treatments $Self Care/Home Management: 8-22   PT G Codes:        09/20/17  Howard Maxwell,  PT [phone]  (pager)  Howard Maxwell Howard Maxwell 09/20/2017, 12:09 PM

## 2017-08-24 NOTE — Care Management Note (Signed)
Case Management Note  Patient Details  Name: CONSTANTINOS KREMPASKY MRN: [id number] Date of Birth: April 26, 1945  Subjective/Objective:  Pt is a 72 y/o male with significant lumbar radiculopathy with chronic back and leg pain due to severe spondylosis and stenosis at L1/L2, L2/3, L 3/4, L 4/5, s/p anterolateral decompression with ex lift, segmental fixation and pedical screws L1-L5.  PTA, pt independent with assistive device, lives at home with spouse.                 Action/Plan: Will follow for home needs as pt progresses.  Wife able to provide assistance at dc.  Expected Discharge Date:                  Expected Discharge Plan:  [place]  In-House Referral:     Discharge planning Services  CM Consult  Post Acute Care Choice:    Choice offered to:     DME Arranged:    DME Agency:     HH Arranged:    [place] Agency:     Status of Service:  In process, will continue to follow  If discussed at Long Length of Stay Meetings, dates discussed:    Additional Comments:  Reinaldo Raddle, RN, BSN  Trauma/Neuro ICU Case Manager [phone]

## 2017-08-24 NOTE — Social Work (Signed)
CSW acknowledging consult for SNF placement. Per PT note pt does not require additional PT follow up.  CSW signing off. Please consult if any additional needs arise.  Alexander Mt, [place] [facility] [phone]

## 2017-08-24 NOTE — Progress Notes (Signed)
PM note  Pain limiting the amount of activity pt able to handle.    08/24/17 1436  PT Visit Information  Last PT Received On 08/24/17  Assistance Needed +1  PT/OT/SLP Co-Evaluation/Treatment Yes  Reason for Co-Treatment For patient/therapist safety  PT goals addressed during session Mobility/safety with mobility  History of Present Illness pt is a 72 y/o male with significant lumbar radiculopathy with chronic back and leg pain due to severe spondylosis and stenosis at L1/L2, L2/3, L 3/4, L 4/5, s/p anterolateral decompression with ex lift, segmental fixation and pedical screws L1-L5.  PMH:  neuropathy, HTN, DM, COPD, CAD with MI.  Subjective Data  Patient Stated Goal home independent  Precautions  Precautions Back  Pain Assessment  Pain Assessment Faces  Pain Score 8  Pain Location back, r hip  Pain Descriptors / Indicators Aching;Burning;Guarding;Grimacing  Bed Mobility  Overal bed mobility Needs Assistance  Bed Mobility Rolling;Sidelying to Sit  Rolling Mod assist  Sidelying to sit Mod assist  General bed mobility comments cues for technique, truncal/LE assist throughout  Transfers  Overall transfer level Needs assistance  Equipment used Rolling walker (2 wheeled)  Transfers Sit to/from Stand  Sit to Stand Mod assist;+2 safety/equipment  General transfer comment cues for hand placement, assist to come forward and up.  Ambulation/Gait  Ambulation/Gait assistance Min assist;+2 safety/equipment  Ambulation Distance (Feet) 20 Feet  Assistive device Rolling walker (2 wheeled)  Gait Pattern/deviations Step-through pattern;Decreased stride length  General Gait Details steady, short steps, guarded.  Balance  Overall balance assessment No apparent balance deficits (not formally assessed);Needs assistance  Sitting-balance support No upper extremity supported;Single extremity supported  Sitting balance-Leahy Scale Fair  Sitting balance - Comments assisted in donning the brace.   Standing balance support Bilateral upper extremity supported  Standing balance-Leahy Scale Poor  Standing balance comment reliant on external support  General Comments  General comments (skin integrity, edema, etc.) reinforced education, bracing issues.  PT - End of Session  Activity Tolerance Patient limited by pain  Patient left with call bell/phone within reach;with family/visitor present;in chair  Nurse Communication Mobility status   PT - Assessment/Plan  PT Plan Current plan remains appropriate  PT Visit Diagnosis Other abnormalities of gait and mobility (R26.89);Pain  Pain - part of body  (back)  PT Frequency (ACUTE ONLY) Min 5X/week  Follow Up Recommendations No PT follow up;Other (comment)  PT equipment Rolling walker with 5" wheels  AM-PAC PT "6 Clicks" Daily Activity Outcome Measure  Difficulty turning over in bed (including adjusting bedclothes, sheets and blankets)? 1  Difficulty moving from lying on back to sitting on the side of the bed?  1  Difficulty sitting down on and standing up from a chair with arms (e.g., wheelchair, bedside commode, etc,.)? 1  Help needed moving to and from a bed to chair (including a wheelchair)? 2  Help needed walking in hospital room? 2  Help needed climbing 3-5 steps with a railing?  2  6 Click Score 9  Mobility G Code  CL  PT Goal Progression  Progress towards PT goals Progressing toward goals  Acute Rehab PT Goals  PT Goal Formulation With patient  Time For Goal Achievement 08/31/17  Potential to Achieve Goals Good  PT Time Calculation  PT Start Time (ACUTE ONLY) 1402  PT Stop Time (ACUTE ONLY) 1436  PT Time Calculation (min) (ACUTE ONLY) 34 min  PT General Charges  $$ ACUTE PT VISIT 1 Visit  PT Treatments  $Gait Training 8-22 mins  08/24/2017  [name], [place] [phone]  (pager)

## 2017-08-25 ENCOUNTER — Inpatient Hospital Stay (HOSPITAL_COMMUNITY): Payer: Medicare Other

## 2017-08-25 LAB — GLUCOSE, CAPILLARY: Glucose-Capillary: 170 mg/dL — ABNORMAL HIGH (ref 65–99)

## 2017-08-25 NOTE — Progress Notes (Signed)
Patient ID: Howard Maxwell, male   DOB: 1945/10/28, 72 y.o.   MRN: [id number] Vital signs are stable Patient having worsening right hip pain Dressings clean and dry Motor function intact Will obtain xray of hip and lumbar spineCT

## 2017-08-25 NOTE — Progress Notes (Signed)
Physical Therapy Treatment Patient Details Name: Howard Maxwell MRN: [id number] DOB: 01-25-1946 Today's Date: 08/25/2017    History of Present Illness pt is a 72 y/o male with significant lumbar radiculopathy with chronic back and leg pain due to severe spondylosis and stenosis at L1/L2, L2/3, L 3/4, L 4/5, s/p anterolateral decompression with ex lift, segmental fixation and pedical screws L1-L5.  PMH:  neuropathy, HTN, DM, COPD, CAD with MI.    PT Comments    Pt reporting increased hip pain on the right.  Encouraged pt to get up into the hall and pt found that it was successful along with some pain meds in calming the pain a bit.   Follow Up Recommendations  No PT follow up;Other (comment)     Equipment Recommendations  Rolling walker with 5" wheels    Recommendations for Other Services       Precautions / Restrictions Precautions Precautions: Back Precaution Comments: back handout provided  Required Braces or Orthoses: Spinal Brace Spinal Brace: Lumbar corset;Applied in sitting position    Mobility  Bed Mobility Overal bed mobility: Needs Assistance Bed Mobility: Rolling;Sidelying to Sit Rolling: Mod assist Sidelying to sit: Mod assist       General bed mobility comments: cues for technique, truncal/LE assist throughout  Transfers Overall transfer level: Needs assistance Equipment used: Rolling walker (2 wheeled) Transfers: Sit to/from Stand Sit to Stand: Mod assist         General transfer comment: cues for hand placement, assist to come forward and up.  Ambulation/Gait Ambulation/Gait assistance: Min assist Ambulation Distance (Feet): 70 Feet Assistive device: Rolling walker (2 wheeled) Gait Pattern/deviations: Step-through pattern;Decreased stride length     General Gait Details: guarded, but steady.  cues for larger steps and postural checks.   Stairs             Wheelchair Mobility    Modified Rankin (Stroke Patients Only)        Balance Overall balance assessment: No apparent balance deficits (not formally assessed);Needs assistance Sitting-balance support: No upper extremity supported;Single extremity supported Sitting balance-Leahy Scale: Fair Sitting balance - Comments: assisted in donning the brace.     Standing balance-Leahy Scale: Poor Standing balance comment: reliant on external support                            Cognition Arousal/Alertness: Awake/alert Behavior During Therapy: WFL for tasks assessed/performed Overall Cognitive Status: Within Functional Limits for tasks assessed                                        Exercises      General Comments General comments (skin integrity, edema, etc.): Reinforced education.  Mobility as help for pain modulation      Pertinent Vitals/Pain Pain Assessment: 0-10 Pain Score: 7  Pain Location: back, r hip Pain Descriptors / Indicators: Aching;Burning;Guarding;Grimacing Pain Intervention(s): Monitored during session    Home Living                      Prior Function            PT Goals (current goals can now be found in the care plan section) Acute Rehab PT Goals Patient Stated Goal: home independent PT Goal Formulation: With patient Time For Goal Achievement: 08/31/17 Potential to Achieve Goals: Good Progress towards PT goals:  Progressing toward goals    Frequency    Min 5X/week      PT Plan Current plan remains appropriate    Co-evaluation              AM-PAC PT "6 Clicks" Daily Activity  Outcome Measure  Difficulty turning over in bed (including adjusting bedclothes, sheets and blankets)?: Unable Difficulty moving from lying on back to sitting on the side of the bed? : Unable Difficulty sitting down on and standing up from a chair with arms (e.g., wheelchair, bedside commode, etc,.)?: Unable Help needed moving to and from a bed to chair (including a wheelchair)?: A Lot Help needed  walking in hospital room?: A Lot Help needed climbing 3-5 steps with a railing? : A Lot 6 Click Score: 9    End of Session   Activity Tolerance: Patient limited by pain Patient left: with call bell/phone within reach;with family/visitor present;in chair Nurse Communication: Mobility status PT Visit Diagnosis: Other abnormalities of gait and mobility (R26.89);Pain Pain - Right/Left: Right     Time: 4356-8616 PT Time Calculation (min) (ACUTE ONLY): 23 min  Charges:  $Gait Training: 8-22 mins $Therapeutic Activity: 8-22 mins                    G Codes:       08/31/2017  [name], PT [phone]  (pager)   Howard Maxwell 31-Aug-2017, 9:56 AM

## 2017-08-25 NOTE — Progress Notes (Signed)
Occupational Therapy Treatment Patient Details Name: Howard Maxwell MRN: [id number] DOB: 02-26-1946 Today's Date: 08/25/2017    History of present illness pt is a 72 y/o male with significant lumbar radiculopathy with chronic back and leg pain due to severe spondylosis and stenosis at L1/L2, L2/3, L 3/4, L 4/5, s/p anterolateral decompression with ex lift, segmental fixation and pedical screws L1-L5.  PMH:  neuropathy, HTN, DM, COPD, CAD with MI.   OT comments  Pt reports pain in R hip as greatest limiting factor and reports poor sleep at this time. Pt able to don brace mod I this session. Pt will need to incr ability to complete bed mobility prior to d/c home.  Pt reports attempting to use cpap machine in the past with poor success and that he can't sleep with that kind of machine. Pt noted to have decr oxygen with sleeping per wife. Pt able to complete session on RA while awake with VSS>  \  Follow Up Recommendations  Home health OT    Equipment Recommendations  3 in 1 bedside commode    Recommendations for Other Services      Precautions / Restrictions Precautions Precautions: Back Required Braces or Orthoses: Spinal Brace Spinal Brace: Lumbar corset;Applied in sitting position       Mobility Bed Mobility Overal bed mobility: Needs Assistance Bed Mobility: Rolling;Supine to Sit Rolling: Min assist   Supine to sit: Mod assist     General bed mobility comments: pt requires use of bed rail and reaching with L Ue to sit up from R side lying  Transfers Overall transfer level: Needs assistance Equipment used: Rolling walker (2 wheeled) Transfers: Sit to/from Stand Sit to Stand: Min assist         General transfer comment: pt requires (A) to power up from bed level. pt needed cues for hand placement    Balance                                           ADL either performed or assessed with clinical judgement   ADL Overall ADL's : Needs  assistance/impaired Eating/Feeding: Independent;Sitting   Grooming: Wash/dry hands;Wash/dry face;Min guard;Standing               Lower Body Dressing: Maximal assistance   Toilet Transfer: Minimal assistance           Functional mobility during ADLs: Min guard;Rolling walker General ADL Comments: pt with wife present. pt reports poor sleep 08/24/17 due to pain. pt reports much improved pain compared this morning but remain in pain in R hip. pt reports sitting in chair as much comfortable.     Vision       Perception     Praxis      Cognition Arousal/Alertness: Awake/alert Behavior During Therapy: WFL for tasks assessed/performed Overall Cognitive Status: Within Functional Limits for tasks assessed                                          Exercises     Shoulder Instructions       General Comments      Pertinent Vitals/ Pain       Pain Assessment: 0-10 Pain Score: 5  Pain Location: R hip Pain Descriptors / Indicators: Aching;Burning;Guarding;Grimacing Pain Intervention(s):  Monitored during session;Premedicated before session;Repositioned  Home Living                                          Prior Functioning/Environment              Frequency  Min 3X/week        Progress Toward Goals  OT Goals(current goals can now be found in the care plan section)  Progress towards OT goals: Progressing toward goals  Acute Rehab OT Goals Patient Stated Goal: home independent OT Goal Formulation: With patient Time For Goal Achievement: 09/07/17 Potential to Achieve Goals: Good ADL Goals Pt Will Perform Grooming: with modified independence;standing Pt Will Perform Lower Body Dressing: with modified independence;with adaptive equipment;sit to/from stand Pt Will Transfer to Toilet: with modified independence;ambulating;bedside commode Additional ADL Goal #1: Pt will complete bed mobility supervision as precursor to adl.s   Plan Discharge plan remains appropriate    Co-evaluation                 AM-PAC PT "6 Clicks" Daily Activity     Outcome Measure   Help from another person eating meals?: None Help from another person taking care of personal grooming?: A Little Help from another person toileting, which includes using toliet, bedpan, or urinal?: A Little Help from another person bathing (including washing, rinsing, drying)?: A Lot Help from another person to put on and taking off regular upper body clothing?: A Lot Help from another person to put on and taking off regular lower body clothing?: A Lot 6 Click Score: 16    End of Session Equipment Utilized During Treatment: Back brace;Oxygen  OT Visit Diagnosis: Unsteadiness on feet (R26.81)   Activity Tolerance Patient tolerated treatment well   Patient Left in chair;with call bell/phone within reach   Nurse Communication Mobility status;Precautions        Time: 1437(1437)-1505 OT Time Calculation (min): 28 min  Charges: OT General Charges $OT Visit: 1 Visit OT Treatments $Self Care/Home Management : 8-22 mins $Therapeutic Activity: 8-22 mins   Jeri Modena   OTR/L Pager: [phone] Office: [phone] .    Parke Poisson B 08/25/2017, 3:51 PM

## 2017-08-26 ENCOUNTER — Encounter: Payer: Medicare Other | Admitting: Nurse Practitioner

## 2017-08-26 LAB — GLUCOSE, CAPILLARY: Glucose-Capillary: 135 mg/dL — ABNORMAL HIGH (ref 65–99)

## 2017-08-26 MED ORDER — LACTULOSE 10 GM/15ML PO SOLN
30.0000 g | Freq: Once | ORAL | Status: AC
  Administered 2017-08-26: 30 g via ORAL
  Filled 2017-08-26: qty 45

## 2017-08-26 MED ORDER — IBUPROFEN 200 MG PO TABS
600.0000 mg | ORAL_TABLET | Freq: Three times a day (TID) | ORAL | Status: DC | PRN
  Administered 2017-08-26 – 2017-08-29 (×3): 600 mg via ORAL
  Filled 2017-08-26 (×3): qty 3

## 2017-08-26 MED ORDER — MAGNESIUM HYDROXIDE 400 MG/5ML PO SUSP: 30.0000 mL | Freq: Every day | ORAL | Status: DC | PRN

## 2017-08-26 MED ORDER — IBUPROFEN 200 MG PO TABS: 600.0000 mg | ORAL_TABLET | Freq: Three times a day (TID) | ORAL | Status: DC | PRN

## 2017-08-26 MED ORDER — LACTULOSE 10 GM/15ML PO SOLN
20.0000 g | Freq: Once | ORAL | Status: AC
  Administered 2017-08-26: 20 g via ORAL
  Filled 2017-08-26: qty 30

## 2017-08-26 MED ORDER — LACTULOSE 10 GM/15ML PO SOLN: 30.0000 g | Freq: Once | ORAL | Status: DC

## 2017-08-26 MED ORDER — MAGNESIUM HYDROXIDE 400 MG/5ML PO SUSP
30.0000 mL | Freq: Every day | ORAL | Status: DC | PRN
  Administered 2017-08-26: 30 mL via ORAL
  Filled 2017-08-26: qty 30

## 2017-08-26 NOTE — Progress Notes (Signed)
Occupational Therapy Treatment Patient Details Name: Howard Maxwell MRN: [id number] DOB: Feb 17, 1946 Today's Date: 08/26/2017    History of present illness pt is a 72 y/o male with significant lumbar radiculopathy with chronic back and leg pain due to severe spondylosis and stenosis at L1/L2, L2/3, L 3/4, L 4/5, s/p anterolateral decompression with ex lift, segmental fixation and pedical screws L1-L5.  PMH:  neuropathy, HTN, DM, COPD, CAD with MI.   OT comments  Pt progressing towards OT goals. Already been mobilizing on unit - session focused on AE education with back kit and further brace education with sink level grooming. Pt is very motivated - limited by R hip pain. OT will continue to follow acutely, and Pt will benefit from skilled OT at the [facility] level at dc. Next session to focus on tub transfer with 3 in 1  Follow Up Recommendations  Home health OT    Equipment Recommendations  3 in 1 bedside commode    Recommendations for Other Services      Precautions / Restrictions Precautions Precautions: Back Required Braces or Orthoses: Spinal Brace Spinal Brace: Lumbar corset;Applied in sitting position       Mobility Bed Mobility Overal bed mobility: Needs Assistance Bed Mobility: Rolling;Supine to Sit Rolling: Min assist   Supine to sit: Mod assist     General bed mobility comments: pt requires use of bed rail and reaching with L Ue to sit up from R side lying  Transfers Overall transfer level: Needs assistance Equipment used: Rolling walker (2 wheeled) Transfers: Sit to/from Stand Sit to Stand: Min assist         General transfer comment: min A for boots up to standing, vc for safe hand placement    Balance Overall balance assessment: Needs assistance Sitting-balance support: No upper extremity supported;Single extremity supported Sitting balance-Leahy Scale: Fair     Standing balance support: Bilateral upper extremity supported Standing balance-Leahy Scale:  Poor Standing balance comment: reliant on external support                           ADL either performed or assessed with clinical judgement   ADL Overall ADL's : Needs assistance/impaired     Grooming: Wash/dry hands;Wash/dry face;Min guard;Standing Grooming Details (indicate cue type and reason): sink level     Lower Body Bathing: Min guard;With adaptive equipment;Sitting/lateral leans Lower Body Bathing Details (indicate cue type and reason): educated on LB bathing with long handle sponge Upper Body Dressing : Moderate assistance;Sitting Upper Body Dressing Details (indicate cue type and reason): educated on don doff of back brace. pt needs (A) to don brace at this time. pt able to latch brace and pull the strings Lower Body Dressing: Minimal assistance;With adaptive equipment;Cueing for sequencing;Sit to/from stand Lower Body Dressing Details (indicate cue type and reason): educated on sock donner, grabber/reacher, and long handle shoe horn           Tub/Shower Transfer Details (indicate cue type and reason): educated on 3 in 1 as shower chair  Functional mobility during ADLs: Min guard;Rolling walker General ADL Comments: AE eudation complete - Pt aware of where he can purchase     Vision       Perception     Praxis      Cognition Arousal/Alertness: Awake/alert Behavior During Therapy: WFL for tasks assessed/performed Overall Cognitive Status: Within Functional Limits for tasks assessed  Exercises     Shoulder Instructions       General Comments      Pertinent Vitals/ Pain       Pain Assessment: 0-10 Pain Score: 6  Pain Location: R hip Pain Descriptors / Indicators: Aching;Burning;Guarding;Grimacing Pain Intervention(s): Monitored during session;Repositioned;Premedicated before session  Home Living                                          Prior  Functioning/Environment              Frequency  Min 3X/week        Progress Toward Goals  OT Goals(current goals can now be found in the care plan section)  Progress towards OT goals: Progressing toward goals  Acute Rehab OT Goals Patient Stated Goal: home independent OT Goal Formulation: With patient Time For Goal Achievement: 09/07/17 Potential to Achieve Goals: Good  Plan Discharge plan remains appropriate    Co-evaluation                 AM-PAC PT "6 Clicks" Daily Activity     Outcome Measure   Help from another person eating meals?: None Help from another person taking care of personal grooming?: A Little Help from another person toileting, which includes using toliet, bedpan, or urinal?: A Little Help from another person bathing (including washing, rinsing, drying)?: A Lot Help from another person to put on and taking off regular upper body clothing?: A Lot Help from another person to put on and taking off regular lower body clothing?: A Lot 6 Click Score: 16    End of Session Equipment Utilized During Treatment: Back brace  OT Visit Diagnosis: Unsteadiness on feet (R26.81)   Activity Tolerance Patient tolerated treatment well   Patient Left with call bell/phone within reach;in bed   Nurse Communication Mobility status;Precautions        Time: 9211-9417 OT Time Calculation (min): 31 min  Charges: OT General Charges $OT Visit: 1 Visit OT Treatments $Self Care/Home Management : 23-37 mins  Hulda Humphrey OTR/L [place] 08/26/2017, 11:35 AM
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
## 2017-08-26 NOTE — Progress Notes (Signed)
Patient ID: Howard Maxwell, male   DOB: Jul 24, 1945, 72 y.o.   MRN: [id number] Vital signs stable Motor function is ok Right hip pain still problematic Pelvis xray ok CT lumbar not particularly notable. Patient noted that this pain seems to be accentuation of what he experienced pre op Will ask ortho to see. He notes he has seen [facility] in the past.

## 2017-08-26 NOTE — Progress Notes (Signed)
Physical Therapy Treatment Patient Details Name: Howard Maxwell MRN: [id number] DOB: 1946/01/04 Today's Date: 08/26/2017    History of Present Illness pt is a 72 y/o male with significant lumbar radiculopathy with chronic back and leg pain due to severe spondylosis and stenosis at L1/L2, L2/3, L 3/4, L 4/5, s/p anterolateral decompression with ex lift, segmental fixation and pedical screws L1-L5.  PMH:  neuropathy, HTN, DM, COPD, CAD with MI.    PT Comments    Despite to additional pain in R hip that orthopedics feels is from bursitis, pt is improving and when appropriate to discharge, this pt and wife will manage safely at home.    Follow Up Recommendations  No PT follow up     Equipment Recommendations  Rolling walker with 5" wheels    Recommendations for Other Services       Precautions / Restrictions Precautions Precautions: Back Required Braces or Orthoses: Spinal Brace Spinal Brace: Lumbar corset;Applied in sitting position    Mobility  Bed Mobility               General bed mobility comments: up in the chair on arrival  Transfers Overall transfer level: Needs assistance Equipment used: Rolling walker (2 wheeled) Transfers: Sit to/from Stand Sit to Stand: Min assist         General transfer comment: RW held for stand, but the pt did not need assist  Ambulation/Gait Ambulation/Gait assistance: Min guard Ambulation Distance (Feet): 200 Feet Assistive device: Rolling walker (2 wheeled) Gait Pattern/deviations: Step-through pattern;Decreased stride length Gait velocity: slower, but pt/wife have been working on increasing speed when the pain is better managed. Gait velocity interpretation: <1.8 ft/sec, indicate of risk for recurrent falls General Gait Details: generally steady, still guarded and still flexed.  Pain significant in R hip.  Orthopedic suspecting bursitis now.   Stairs Stairs: Yes Stairs assistance: Mod assist Stair Management: One rail  Left;Step to pattern;Forwards Number of Stairs: 3 General stair comments: effortful and painful, but safe with wife's assist   Wheelchair Mobility    Modified Rankin (Stroke Patients Only)       Balance Overall balance assessment: Needs assistance Sitting-balance support: No upper extremity supported Sitting balance-Leahy Scale: Fair Sitting balance - Comments: assisted in donning the brace.     Standing balance-Leahy Scale: Poor Standing balance comment: reliant on external support                            Cognition Arousal/Alertness: Awake/alert Behavior During Therapy: WFL for tasks assessed/performed Overall Cognitive Status: Within Functional Limits for tasks assessed                                        Exercises      General Comments        Pertinent Vitals/Pain Pain Assessment: 0-10 Pain Score: 6  Pain Location: R hip Pain Descriptors / Indicators: Aching;Burning;Guarding;Grimacing Pain Intervention(s): Monitored during session    Home Living                      Prior Function            PT Goals (current goals can now be found in the care plan section) Acute Rehab PT Goals Patient Stated Goal: home independent PT Goal Formulation: With patient Time For Goal Achievement: 08/31/17 Potential to  Achieve Goals: Good Progress towards PT goals: Progressing toward goals    Frequency    Min 5X/week      PT Plan Current plan remains appropriate    Co-evaluation              AM-PAC PT "6 Clicks" Daily Activity  Outcome Measure  Difficulty turning over in bed (including adjusting bedclothes, sheets and blankets)?: Unable Difficulty moving from lying on back to sitting on the side of the bed? : Unable Difficulty sitting down on and standing up from a chair with arms (e.g., wheelchair, bedside commode, etc,.)?: Unable Help needed moving to and from a bed to chair (including a wheelchair)?: A  Little Help needed walking in hospital room?: A Little Help needed climbing 3-5 steps with a railing? : A Lot 6 Click Score: 11    End of Session   Activity Tolerance: Patient limited by pain Patient left: in chair;with call bell/phone within reach;with family/visitor present Nurse Communication: Mobility status PT Visit Diagnosis: Other abnormalities of gait and mobility (R26.89);Pain Pain - Right/Left: Right     Time: 6578-4696 PT Time Calculation (min) (ACUTE ONLY): 24 min  Charges:  $Gait Training: 8-22 mins $Therapeutic Activity: 8-22 mins                    G Codes:       September 15, 2017  [name], PT [phone]  (pager)   Tessie Fass Jadan Rouillard 09-15-17, 5:19 PM

## 2017-08-27 LAB — GLUCOSE, CAPILLARY: Glucose-Capillary: 167 mg/dL — ABNORMAL HIGH (ref 65–99)

## 2017-08-27 MED ORDER — FLEET ENEMA 7-19 GM/118ML RE ENEM
1.0000 | ENEMA | Freq: Every day | RECTAL | Status: DC | PRN
  Administered 2017-08-27: 1 via RECTAL
  Filled 2017-08-27: qty 1

## 2017-08-27 MED ORDER — DEXAMETHASONE 4 MG PO TABS
4.0000 mg | ORAL_TABLET | Freq: Every day | ORAL | Status: DC
  Administered 2017-08-28 – 2017-08-29 (×2): 4 mg via ORAL
  Filled 2017-08-27 (×2): qty 1

## 2017-08-27 MED ORDER — DEXAMETHASONE SODIUM PHOSPHATE 4 MG/ML IJ SOLN
4.0000 mg | Freq: Once | INTRAMUSCULAR | Status: AC
  Administered 2017-08-27: 4 mg via INTRAVENOUS
  Filled 2017-08-27: qty 1

## 2017-08-27 MED FILL — Heparin Sodium (Porcine) Inj 1000 Unit/ML: INTRAMUSCULAR | Qty: 30 | Status: AC

## 2017-08-27 MED FILL — Sodium Chloride IV Soln 0.9%: INTRAVENOUS | Qty: 1000 | Status: AC

## 2017-08-27 NOTE — Progress Notes (Signed)
Call to Dr Ellene Route office and spoke with Ardelle Park to give Dr Ellene Route information that patient complaining with the worse pain in the right hip and leg pain ever.  Patiet states that the Morphine and Oxycodone is not working and he is shared something else may be going on.  Wife states she thinks he needs the prednisone for inflammation.

## 2017-08-27 NOTE — Progress Notes (Signed)
Patient ID: Howard Maxwell, male   DOB: 1945/09/20, 72 y.o.   MRN: [id number] Dr. Bonnita Levan note appreciated After the first dose of ibuprofen however the patient had recurrent pain in the right hip and had pain throughout the night I was not aware of this until this afternoon and I prescribed a dose of Decadron This seems to have helped considerably We will re-dose Decadron tomorrow Bowels have not moved and patient will likely need enema If pain is under good control for 24 hours will discharge home with Sterapred Dosepak Otherwise continue to monitor in hospital

## 2017-08-27 NOTE — Anesthesia Postprocedure Evaluation (Signed)
Anesthesia Post Note  Patient: Howard Maxwell  Procedure(s) Performed: Anterolateral Decompression, arthrodesis - Lumbar One-Two, Lumbar Two-Three, Lumbar Three-Four, Lumbar Four-Five Percutaneous posterior fixation (N/A Spine Lumbar) LUMBAR PERCUTANEOUS PEDICLE SCREW 4 LEVEL (N/A Spine Lumbar) APPLICATION OF ROBOTIC ASSISTANCE FOR SPINAL PROCEDURE (N/A Spine Lumbar)     Patient location during evaluation: PACU Anesthesia Type: General Level of consciousness: awake and alert Pain management: pain level controlled Vital Signs Assessment: post-procedure vital signs reviewed and stable Respiratory status: spontaneous breathing, nonlabored ventilation, respiratory function stable and patient connected to nasal cannula oxygen Cardiovascular status: blood pressure returned to baseline and stable Postop Assessment: no apparent nausea or vomiting Anesthetic complications: no    Last Vitals:  Vitals:   08/27/17 0300 08/27/17 0841  BP: 107/68 131/70  Pulse: (!) 59 68  Resp:    Temp: 37.2 C 37.1 C  SpO2:  95%    Last Pain:  Vitals:   08/27/17 0841  TempSrc: Oral  PainSc:                  Howard Maxwell

## 2017-08-27 NOTE — Progress Notes (Signed)
Patient has continued to try different positions to get comfortable in all day.  Has experienced some relief since Decadron given.  Will continue to monitor pain relief/.

## 2017-08-27 NOTE — Progress Notes (Signed)
Physical Therapy Treatment Patient Details Name: Howard Maxwell MRN: [id number] DOB: 04-25-1945 Today's Date: 08/27/2017    History of Present Illness pt is a 72 y/o male with significant lumbar radiculopathy with chronic back and leg pain due to severe spondylosis and stenosis at L1/L2, L2/3, L 3/4, L 4/5, s/p anterolateral decompression with ex lift, segmental fixation and pedical screws L1-L5.  PMH:  neuropathy, HTN, DM, COPD, CAD with MI.    PT Comments    Despite difficult management of pt's pain, he keeps moving and he/wife mobilize safely together.    Follow Up Recommendations  No PT follow up     Equipment Recommendations  Rolling walker with 5" wheels    Recommendations for Other Services       Precautions / Restrictions Precautions Precautions: Back Required Braces or Orthoses: Spinal Brace Spinal Brace: Lumbar corset;Applied in sitting position    Mobility  Bed Mobility Overal bed mobility: Needs Assistance Bed Mobility: Sit to Sidelying;Sidelying to Sit;Rolling Rolling: Min guard(with rail) Sidelying to sit: Min guard(with rail)     Sit to sidelying: Mod assist General bed mobility comments: pt and wife use great technique, but pt continues to hurt with any technique  Transfers Overall transfer level: Needs assistance Equipment used: Rolling walker (2 wheeled) Transfers: Sit to/from Stand Sit to Stand: Min assist(min guard from elevated surface.)         General transfer comment: pt/wife use safe/appropriate technique.  Ambulation/Gait Ambulation/Gait assistance: Min guard Ambulation Distance (Feet): 70 Feet Assistive device: Rolling walker (2 wheeled) Gait Pattern/deviations: Step-through pattern Gait velocity: slower, but pt/wife have been working on increasing speed when the pain is better managed. Gait velocity interpretation: <1.8 ft/sec, indicate of risk for recurrent falls General Gait Details: steady.  Still guarded, needing cues for  posture.  Right hip hurting bad with bursitis pain.   Stairs         General stair comments: Reinforced education   Wheelchair Mobility    Modified Rankin (Stroke Patients Only)       Balance Overall balance assessment: Needs assistance Sitting-balance support: No upper extremity supported Sitting balance-Leahy Scale: Fair     Standing balance support: Bilateral upper extremity supported Standing balance-Leahy Scale: Poor Standing balance comment: reliant on external support                            Cognition Arousal/Alertness: Awake/alert Behavior During Therapy: WFL for tasks assessed/performed Overall Cognitive Status: Within Functional Limits for tasks assessed                                        Exercises      General Comments        Pertinent Vitals/Pain Pain Assessment: 0-10 Faces Pain Scale: Hurts even more Pain Location: right hip and across the back Pain Descriptors / Indicators: Aching;Sore Pain Intervention(s): Monitored during session    Home Living                      Prior Function            PT Goals (current goals can now be found in the care plan section) Acute Rehab PT Goals Patient Stated Goal: home independent PT Goal Formulation: With patient Time For Goal Achievement: 08/31/17 Potential to Achieve Goals: Good Progress towards PT goals: Progressing toward  goals    Frequency    Min 5X/week      PT Plan Current plan remains appropriate    Co-evaluation              AM-PAC PT "6 Clicks" Daily Activity  Outcome Measure  Difficulty turning over in bed (including adjusting bedclothes, sheets and blankets)?: A Lot Difficulty moving from lying on back to sitting on the side of the bed? : Unable Difficulty sitting down on and standing up from a chair with arms (e.g., wheelchair, bedside commode, etc,.)?: Unable Help needed moving to and from a bed to chair (including a  wheelchair)?: A Little Help needed walking in hospital room?: A Little Help needed climbing 3-5 steps with a railing? : A Lot 6 Click Score: 12    End of Session   Activity Tolerance: Patient limited by pain Patient left: in chair;with call bell/phone within reach;with family/visitor present Nurse Communication: Mobility status PT Visit Diagnosis: Other abnormalities of gait and mobility (R26.89);Pain Pain - part of body: (hip and back)     Time: 5732-2567 PT Time Calculation (min) (ACUTE ONLY): 19 min  Charges:  $Therapeutic Activity: 8-22 mins                    G Codes:       2017-09-09  Howard Maxwell, PT [phone]  (pager)   Howard Maxwell 09-Sep-2017, 1:06 PM

## 2017-08-28 LAB — GLUCOSE, CAPILLARY: Glucose-Capillary: 144 mg/dL — ABNORMAL HIGH (ref 65–99)

## 2017-08-28 MED ORDER — MAGNESIUM CITRATE PO SOLN
1.0000 | Freq: Once | ORAL | Status: AC
  Administered 2017-08-28: 1 via ORAL
  Filled 2017-08-28: qty 296

## 2017-08-28 NOTE — Progress Notes (Addendum)
Neurosurgery Progress Note  No issues overnight.  Hip pain much improved with decadron. Complains of back tightness and soreness. Denies radicular symptoms Does not feel ready to go home just yet No bm yet  EXAM:  BP 126/81 (BP Location: Right Arm)   Pulse 61   Temp 98.3 F (36.8 C) (Oral)   Resp (!) 24   Ht 5\' 10"  (1.778 m)   Wt 117.5 kg (259 lb)   SpO2 99%   BMI 37.16 kg/m   Awake, alert, oriented  Speech fluent, appropriate  MAEW with good strength Incision c/d/i  PLAN Stable this morning.  Decadron greatly improved hip pain. Will re-dose this am.  Does not feel ready to go just yet. Will continue to work on pain management. Work with therapy today. Still without BM. Will rx mag citrate Likely d/c tomorrow.

## 2017-08-28 NOTE — Progress Notes (Signed)
Administered Fleets enema at 2133. Pt tolerated well. Pt advised to hold it as long as possible for best results. Pt called about 40 minutes later and stated he was not able to produce much. There was a small amount less than 50 mLs of brown water. There was no stool noted in the bed side commode. Pt reports pain is less in hip than it has been. Now pain is more generalized but still feels most of the pain is in his hip. Will continue to monitor. Audia Amick, 08/28/2017, 4:23 AM

## 2017-08-28 NOTE — Progress Notes (Signed)
Patient has had 2-3 bowel movements today. Tolerated po pain meds with the Decadron though he states thee hip pain is starting "to get in front of the back pain." Much encouragement given to patient to avoid IV pain meds so hopefully he can go home tomorrow.

## 2017-08-29 LAB — GLUCOSE, CAPILLARY: Glucose-Capillary: 132 mg/dL — ABNORMAL HIGH (ref 65–99)

## 2017-08-29 MED ORDER — DEXAMETHASONE SODIUM PHOSPHATE 10 MG/ML IJ SOLN
10.0000 mg | Freq: Once | INTRAMUSCULAR | Status: AC
  Administered 2017-08-29: 10 mg via INTRAVENOUS
  Filled 2017-08-29: qty 1

## 2017-08-29 MED ORDER — METHYLPREDNISOLONE 4 MG PO TBPK: ORAL_TABLET | ORAL | 0 refills | Status: DC

## 2017-08-29 MED ORDER — OXYCODONE HCL 10 MG PO TABS: 10.0000 mg | ORAL_TABLET | ORAL | 0 refills | Status: DC | PRN

## 2017-08-29 NOTE — Progress Notes (Signed)
Occupational Therapy Treatment Patient Details Name: Howard Maxwell MRN: [id number] DOB: [birth year] Today's Date: 08/29/2017    History of present illness pt is a 72 y/o male with significant lumbar radiculopathy with chronic back and leg pain due to severe spondylosis and stenosis at L1/L2, L2/3, L 3/4, L 4/5, s/p anterolateral decompression with ex lift, segmental fixation and pedical screws L1-L5.  PMH:  neuropathy, HTN, DM, COPD, CAD with MI.   OT comments  Pt. Able to return demo of walk in shower transfer in preparation for d/c home later today.   Follow Up Recommendations  Home health OT    Equipment Recommendations  3 in 1 bedside commode    Recommendations for Other Services      Precautions / Restrictions Precautions Precautions: Back Required Braces or Orthoses: Spinal Brace Spinal Brace: Lumbar corset;Applied in sitting position       Mobility Bed Mobility               General bed mobility comments: seated in recliner at beginning and end of session  Transfers   Equipment used: Rolling walker (2 wheeled) Transfers: Sit to/from Stand Sit to Stand: Min guard              Balance                                           ADL either performed or assessed with clinical judgement   ADL Overall ADL's : Needs assistance/impaired                                 Tub/ Shower Transfer: Walk-in shower;Ambulation;Rolling walker;Anterior/posterior Tub/Shower Transfer Details (indicate cue type and reason): simulated transfer over ledge.  pt. able to step backwards in and then forward out of the shower.  report they are having their tub/shower converted to a large walk in with a built in bench. will use smaller walk in shower until renovations complete   General ADL Comments: AE eudation complete - Pt aware of where he can purchase-assisted wife with [place] search and wrote inst. on how to order the kit     Vision        Perception     Praxis      Cognition Arousal/Alertness: Awake/alert Behavior During Therapy: WFL for tasks assessed/performed Overall Cognitive Status: Within Functional Limits for tasks assessed                                          Exercises     Shoulder Instructions       General Comments      Pertinent Vitals/ Pain       Pain Assessment: No/denies pain  Home Living                                          Prior Functioning/Environment              Frequency  Min 3X/week        Progress Toward Goals  OT Goals(current goals can now be found in the care plan section)  Progress towards OT  goals: Progressing toward goals     Plan Discharge plan remains appropriate    Co-evaluation                 AM-PAC PT "6 Clicks" Daily Activity     Outcome Measure   Help from another person eating meals?: None Help from another person taking care of personal grooming?: A Little Help from another person toileting, which includes using toliet, bedpan, or urinal?: A Little Help from another person bathing (including washing, rinsing, drying)?: A Lot Help from another person to put on and taking off regular upper body clothing?: A Lot Help from another person to put on and taking off regular lower body clothing?: A Lot 6 Click Score: 16    End of Session Equipment Utilized During Treatment: Gait belt;Rolling walker;Back brace  OT Visit Diagnosis: Unsteadiness on feet (R26.81)   Activity Tolerance Patient tolerated treatment well   Patient Left in chair;with call bell/phone within reach;with family/visitor present   Nurse Communication          Time: 9211-9417 OT Time Calculation (min): 17 min  Charges: OT General Charges $OT Visit: 1 Visit OT Treatments $Self Care/Home Management : 8-22 mins    Janice Coffin, COTA/L 08/29/2017, 10:51 AM

## 2017-08-29 NOTE — Discharge Summary (Signed)
Physician Discharge Summary  Patient ID: LAREN ESTRIDGE MRN: [id number] DOB/AGE: Jun 14, 1945 72 y.o.  Admit date: 08/23/2017 Discharge date: 08/29/2017  Admission Diagnoses:  Lumbar stenosis with neurogenic claudication  Discharge Diagnoses:  Same Active Problems:   Lumbar stenosis with neurogenic claudication   Discharged Condition: Stable  Hospital Course:  JULUS RANCOURT is a 72 y.o. male who was admitted for the below procedure. Post operative course complicated by bursitis. Pain much improved with decadron. At time of discharge, pain was well controlled, ambulating with Pt/OT, tolerating po, voiding normal. Ready for discharge.  Treatments: Surgery Anterolateral indirect decompression L1-L2 3 L3-4 L4-5 using ex lift technique and titanium spacers.  Arthrodesis with allograft and infuse L1-L2 3 L3-4 L4-5.  Posterior percutaneous segmental fixation L1-L5 with robotically guided placement of pedicle screws L1-L5.  Neuro monitoring during the anterolateral decompression.   Discharge Exam: Blood pressure 138/74, pulse (!) 54, temperature 98 F (36.7 C), resp. rate 18, height 5\' 10"  (1.778 m), weight 117.5 kg (259 lb), SpO2 98 %. Awake, alert, oriented Speech fluent, appropriate CN grossly intact MAEW with good strength Wound c/d/i  Disposition: Discharge disposition: 01-Home or Self Care       Discharge Instructions    Call MD for:  difficulty breathing, headache or visual disturbances   Complete by:  As directed    Call MD for:  persistant dizziness or light-headedness   Complete by:  As directed    Call MD for:  redness, tenderness, or signs of infection (pain, swelling, redness, odor or green/yellow discharge around incision site)   Complete by:  As directed    Call MD for:  severe uncontrolled pain   Complete by:  As directed    Call MD for:  temperature >100.4   Complete by:  As directed    Diet general   Complete by:  As directed    Driving Restrictions    Complete by:  As directed    Do not drive until given clearance.   Incentive spirometry RT   Complete by:  As directed    Increase activity slowly   Complete by:  As directed    Lifting restrictions   Complete by:  As directed    Do not lift anything >10lbs. Avoid bending and twisting in awkward positions. Avoid bending at the back.   May shower / Bathe   Complete by:  As directed    In 24 hours. Okay to wash wound with warm soapy water. Avoid scrubbing the wound. Pat dry.   Remove dressing in 24 hours   Complete by:  As directed      Allergies as of 08/29/2017      Reactions   Codeine Sulfate Nausea Only   Loratadine Other (See Comments)   Leaves bad taste in the mouth.      Medication List    TAKE these medications   atorvastatin 40 MG tablet Commonly known as:  LIPITOR Take 1 tablet (40 mg total) by mouth daily.   CALCIUM 600/VITAMIN D3 600-800 MG-UNIT Tabs Generic drug:  Calcium Carb-Cholecalciferol Take 1 tablet by mouth daily.   carvedilol 6.25 MG tablet Commonly known as:  COREG TAKE 1 TABLET BY MOUTH TWO  TIMES DAILY   CENTRUM SILVER PO Take 1 tablet by mouth daily.   CoQ10 200 MG Caps Take 200 mg by mouth daily.   D3-1000 PO Take 1,000 Units by mouth daily.   fluocinonide gel 0.05 % Commonly known as:  LIDEX Apply 1  application topically daily as needed (itching).   glipiZIDE 10 MG 24 hr tablet Commonly known as:  GLUCOTROL XL Take 10 mg by mouth daily with breakfast.   guaifenesin 400 MG Tabs tablet Commonly known as:  HUMIBID E Take 400 mg by mouth every 4 (four) hours.   losartan-hydrochlorothiazide 100-12.5 MG tablet Commonly known as:  HYZAAR Take 1 tablet by mouth daily.   Magnesium Oxide 500 MG Caps Take 1 capsule (500 mg total) by mouth 2 (two) times daily at 8 am and 10 pm.   metFORMIN 850 MG tablet Commonly known as:  GLUCOPHAGE Take 850 mg by mouth 2 (two) times daily with a meal.   methylPREDNISolone 4 MG Tbpk tablet Commonly  known as:  MEDROL Take according to package insert   naphazoline 0.1 % ophthalmic solution Commonly known as:  NAPHCON Place 1 drop into both eyes as needed (redness).   neomycin-polymyxin-dexameth 0.1 % Oint Commonly known as:  MAXITROL Place 2 application into the left eye daily as needed (blind eye).   Omega 3 1200 MG Caps Take 1,280 mg by mouth daily.   omeprazole 40 MG capsule Commonly known as:  PRILOSEC Take 40 mg by mouth daily.   Oxycodone HCl 10 MG Tabs Take 1 tablet (10 mg total) by mouth every 3 (three) hours as needed. What changed:    medication strength  how much to take  when to take this  reasons to take this  Another medication with the same name was removed. Continue taking this medication, and follow the directions you see here.   oxyCODONE-acetaminophen 7.5-325 MG tablet Commonly known as:  PERCOCET Take 1 tablet by mouth every 4 (four) hours as needed for severe pain.   Potassium 99 MG Tabs Take 99 mg by mouth daily.   senna-docusate 8.6-50 MG tablet Commonly known as:  Senokot-S Take 1 tablet by mouth once a week. 1 to 2 weekly   SLOW FE 142 (45 Fe) MG Tbcr Generic drug:  Ferrous Sulfate Take 1 tablet by mouth daily.   spironolactone 25 MG tablet Commonly known as:  ALDACTONE TAKE 1 TABLET BY MOUTH  DAILY   SYSTANE 0.4-0.3 % Soln Generic drug:  Polyethyl Glycol-Propyl Glycol Place 1 drop into both eyes daily as needed (dry eyes).   tiZANidine 2 MG tablet Commonly known as:  ZANAFLEX Take 6 mg by mouth 2 (two) times daily.   vitamin B-12 1000 MCG tablet Commonly known as:  CYANOCOBALAMIN Take 1,000 mcg by mouth daily.   vitamin C 500 MG tablet Commonly known as:  ASCORBIC ACID Take 500 mg by mouth daily.      Follow-up Information    Barnett Abu, MD Follow up.   Specialty:  Neurosurgery Contact information: [address] [place] [address] [phone]           Signed: Alyson Ingles 08/29/2017, 10:02 AM

## 2017-09-16 DIAGNOSIS — M6530 Trigger finger, unspecified finger: Secondary | ICD-10-CM | POA: Insufficient documentation

## 2017-10-05 DIAGNOSIS — D649 Anemia, unspecified: Secondary | ICD-10-CM | POA: Insufficient documentation

## 2017-10-09 ENCOUNTER — Other Ambulatory Visit: Payer: Self-pay | Admitting: Cardiovascular Disease

## 2017-11-10 ENCOUNTER — Encounter: Payer: Medicare Other | Admitting: Nurse Practitioner

## 2017-11-18 ENCOUNTER — Encounter: Payer: Self-pay | Admitting: Nurse Practitioner

## 2017-11-18 ENCOUNTER — Ambulatory Visit: Payer: Medicare Other | Attending: Nurse Practitioner | Admitting: Nurse Practitioner

## 2017-11-18 ENCOUNTER — Other Ambulatory Visit: Payer: Self-pay

## 2017-11-18 VITALS — BP 121/76 | HR 67 | Temp 98.3°F | Resp 16 | Ht 70.0 in | Wt 239.0 lb

## 2017-11-18 DIAGNOSIS — Z7984 Long term (current) use of oral hypoglycemic drugs: Secondary | ICD-10-CM | POA: Insufficient documentation

## 2017-11-18 DIAGNOSIS — M79601 Pain in right arm: Secondary | ICD-10-CM | POA: Diagnosis not present

## 2017-11-18 DIAGNOSIS — Z79891 Long term (current) use of opiate analgesic: Secondary | ICD-10-CM | POA: Diagnosis not present

## 2017-11-18 DIAGNOSIS — D649 Anemia, unspecified: Secondary | ICD-10-CM | POA: Insufficient documentation

## 2017-11-18 DIAGNOSIS — Z888 Allergy status to other drugs, medicaments and biological substances status: Secondary | ICD-10-CM | POA: Insufficient documentation

## 2017-11-18 DIAGNOSIS — I10 Essential (primary) hypertension: Secondary | ICD-10-CM | POA: Insufficient documentation

## 2017-11-18 DIAGNOSIS — Z79899 Other long term (current) drug therapy: Secondary | ICD-10-CM | POA: Insufficient documentation

## 2017-11-18 DIAGNOSIS — M9981 Other biomechanical lesions of cervical region: Secondary | ICD-10-CM

## 2017-11-18 DIAGNOSIS — M5414 Radiculopathy, thoracic region: Secondary | ICD-10-CM | POA: Insufficient documentation

## 2017-11-18 DIAGNOSIS — M5412 Radiculopathy, cervical region: Secondary | ICD-10-CM | POA: Diagnosis not present

## 2017-11-18 DIAGNOSIS — Z5181 Encounter for therapeutic drug level monitoring: Secondary | ICD-10-CM | POA: Diagnosis not present

## 2017-11-18 DIAGNOSIS — I471 Supraventricular tachycardia: Secondary | ICD-10-CM | POA: Diagnosis not present

## 2017-11-18 DIAGNOSIS — M6530 Trigger finger, unspecified finger: Secondary | ICD-10-CM | POA: Insufficient documentation

## 2017-11-18 DIAGNOSIS — M4802 Spinal stenosis, cervical region: Secondary | ICD-10-CM | POA: Diagnosis not present

## 2017-11-18 DIAGNOSIS — Z885 Allergy status to narcotic agent status: Secondary | ICD-10-CM | POA: Insufficient documentation

## 2017-11-18 DIAGNOSIS — M79602 Pain in left arm: Secondary | ICD-10-CM | POA: Insufficient documentation

## 2017-11-18 DIAGNOSIS — M1712 Unilateral primary osteoarthritis, left knee: Secondary | ICD-10-CM | POA: Diagnosis not present

## 2017-11-18 DIAGNOSIS — K219 Gastro-esophageal reflux disease without esophagitis: Secondary | ICD-10-CM

## 2017-11-18 DIAGNOSIS — M160 Bilateral primary osteoarthritis of hip: Secondary | ICD-10-CM | POA: Insufficient documentation

## 2017-11-18 DIAGNOSIS — Z87891 Personal history of nicotine dependence: Secondary | ICD-10-CM | POA: Insufficient documentation

## 2017-11-18 DIAGNOSIS — R9389 Abnormal findings on diagnostic imaging of other specified body structures: Secondary | ICD-10-CM | POA: Insufficient documentation

## 2017-11-18 DIAGNOSIS — M4626 Osteomyelitis of vertebra, lumbar region: Secondary | ICD-10-CM | POA: Diagnosis not present

## 2017-11-18 DIAGNOSIS — I252 Old myocardial infarction: Secondary | ICD-10-CM | POA: Insufficient documentation

## 2017-11-18 DIAGNOSIS — E785 Hyperlipidemia, unspecified: Secondary | ICD-10-CM | POA: Diagnosis not present

## 2017-11-18 DIAGNOSIS — G894 Chronic pain syndrome: Secondary | ICD-10-CM | POA: Insufficient documentation

## 2017-11-18 DIAGNOSIS — M5136 Other intervertebral disc degeneration, lumbar region: Secondary | ICD-10-CM | POA: Insufficient documentation

## 2017-11-18 DIAGNOSIS — I25110 Atherosclerotic heart disease of native coronary artery with unstable angina pectoris: Secondary | ICD-10-CM | POA: Diagnosis not present

## 2017-11-18 DIAGNOSIS — M48061 Spinal stenosis, lumbar region without neurogenic claudication: Secondary | ICD-10-CM | POA: Diagnosis not present

## 2017-11-18 DIAGNOSIS — M47816 Spondylosis without myelopathy or radiculopathy, lumbar region: Secondary | ICD-10-CM | POA: Diagnosis not present

## 2017-11-18 DIAGNOSIS — M4826 Kissing spine, lumbar region: Secondary | ICD-10-CM | POA: Insufficient documentation

## 2017-11-18 DIAGNOSIS — M7918 Myalgia, other site: Secondary | ICD-10-CM

## 2017-11-18 DIAGNOSIS — E119 Type 2 diabetes mellitus without complications: Secondary | ICD-10-CM | POA: Insufficient documentation

## 2017-11-18 DIAGNOSIS — J449 Chronic obstructive pulmonary disease, unspecified: Secondary | ICD-10-CM | POA: Insufficient documentation

## 2017-11-18 MED ORDER — OXYCODONE HCL 5 MG PO TABS: 5.0000 mg | ORAL_TABLET | Freq: Four times a day (QID) | ORAL | 0 refills | Status: DC | PRN

## 2017-11-18 MED ORDER — MELATONIN 10 MG PO CAPS: 20.0000 mg | ORAL_CAPSULE | Freq: Every evening | ORAL | 2 refills | Status: AC | PRN

## 2017-11-18 NOTE — Progress Notes (Signed)
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 temperature, and hair growth are WNL. No peripheral edema or cyanosis. No masses, redness, swelling, asymmetry, or associated skin lesions. No contractures.  Functional ROM: Unrestricted ROM                  Functional ROM: Unrestricted ROM                  Muscle Tone/Strength: Functionally intact. No obvious neuro-muscular anomalies detected.  Muscle Tone/Strength: Functionally intact. No obvious neuro-muscular anomalies detected.  Sensory (Neurological): Unimpaired  Sensory (Neurological): Unimpaired  Palpation: No palpable anomalies  Palpation: No palpable anomalies   Assessment  Primary Diagnosis & Pertinent Problem List: The primary encounter diagnosis was Lumbar central spinal stenosis (L2-3 and L3-4). 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  Status Diagnosis  Controlled Controlled Controlled 1. Lumbar central spinal stenosis (L2-3 and L3-4)   2. Lumbar DDD (degenerative disc disease)   3. 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 Musculoskeletal pain   5. Gastroesophageal reflux disease without esophagitis   6. Chronic pain syndrome   7. Long term prescription opiate use     Problems updated and reviewed during this visit: Problem  Anemia  Trigger Finger   Plan of Care  Pharmacotherapy (Medications Ordered): Meds ordered this encounter  Medications  . oxyCODONE (OXY IR/ROXICODONE) 5 MG immediate release tablet    Sig: Take 1 tablet (5 mg total) by mouth every 6 (six) hours as needed for severe pain.    Dispense:  120 tablet    Refill:  0    Do not place this medication on "Automatic Refill". Patient may have prescription filled one day early if pharmacy is closed on scheduled refill date. Do not fill until:02/06/2018 To last until:03/08/2018    Order Specific Question:   Supervising Provider    Answer:   Milinda Pointer [phone]  . oxyCODONE (OXY IR/ROXICODONE) 5 MG immediate release tablet    Sig: Take 1 tablet (5 mg total) by mouth every 6 (six) hours as needed for severe pain.    Dispense:  120 tablet    Refill:  0    Do not place this medication on "Automatic Refill". Patient may have prescription filled one day early if pharmacy is closed on scheduled refill date. Do not fill until: 10/18/2019To last until: 02/06/2018    Order Specific Question:   Supervising Provider    Answer:   Milinda Pointer [phone]  . oxyCODONE (OXY IR/ROXICODONE) 5 MG immediate release tablet    Sig: Take 1 tablet (5 mg total) by mouth every 6 (six) hours as needed for severe pain.    Dispense:  120 tablet    Refill:  0    Do  not place this medication on "Automatic Refill". Patient may have prescription filled one day early if pharmacy is closed on scheduled refill date. Do not fill until:12/08/2017 To  last until: 01/07/2018    Order Specific Question:   Supervising Provider    Answer:   Milinda Pointer [phone]  . Melatonin 10 MG CAPS    Sig: Take 20 mg by mouth at bedtime as needed.    Dispense:  30 capsule    Refill:  2    Do not add to the electronic "Automatic Refill" notification system. Patient may have prescription filled one day early if pharmacy is closed on scheduled refill date.    Order Specific Question:   Supervising Provider    Answer:   Milinda Pointer [phone]   New Prescriptions   MELATONIN 10 MG CAPS    Take 20 mg by mouth at bedtime as needed.   Medications administered today: Howard Maxwell had no medications administered during this visit. Lab-work, procedure(s), and/or referral(s): Orders Placed This Encounter  Procedures  . ToxASSURE Select 13 (MW), Urine   Imaging and/or referral(s): None  Interventional management options: Planned, scheduled, and/or pending: Not at this time.   Considering: Diagnostic bilateral lumbar facetblock  Possible bilateral lumbar facet RFA Diagnostic L3-4 interspinous ligament block Possible L3-4 bilateral medial branch RFA Diagnostic right sided L2-3 and L3-4 lumbar epiduralsteroid injection  Diagnostic bilateral L1-2 &L4-5 transforaminalepidural steroid injection  Diagnostic bilateral L2-3 transforaminal epidural steroid injection Diagnostic caudal epidural steroid injection + diagnostic epidurogram Possible Racz procedure Diagnostic bilateral intra-articular hipjoint injection  Diagnostic bilateral femoral nerve and obturator nerveblock  Possible bilateral femoral nerve and obturator nerve RFA Diagnostic left cervical epidural steroid injection  Diagnostic bilateral cervical facet block Possible bilateral cervical  facet RFA Possible series of 5 left intra-articular [place]nee injection  Diagnostic left Genicular nerve block Possible left Genicular nerve radiofrequencyablation  Possible intrathecal opioid trial   Palliative PRN treatment(s): None at this time    Provider-requested follow-up: Return in about 3 months (around 02/18/2018) for MedMgmt with Me Donella Stade Edison Pace).  Future Appointments  Date Time Provider [place]  02/09/2018  9:30 AM Vevelyn Francois, NP [facility] None   Primary Care Physician: Tracie Harrier, MD Location: [facility] Note by: Vevelyn Francois NP Date: 11/18/2017; Time: 10:49 AM  Pain Score Disclaimer: We use the NRS-11 scale. This is a self-reported, subjective measurement of pain severity with only modest accuracy. It is used primarily to identify changes within a particular patient. It must be understood that outpatient pain scales are significantly less accurate that those used for research, where they can be applied under ideal controlled circumstances with minimal exposure to variables. In reality, the score is likely to be a combination of pain intensity and pain affect, where pain affect describes the degree of emotional arousal or changes in action readiness caused by the sensory experience of pain. Factors such as social and work situation, setting, emotional state, anxiety levels, expectation, and prior pain experience may influence pain perception and show large inter-individual differences that may also be affected by time variables.  Patient instructions provided during this appointment: Patient Instructions   Melatonin has been escribed to your pharmacy.   You have been given 3 Rx for Oxycodone 5 mg to last until 03/08/2018.____________________________________________________________________________________________  Medication Rules  Applies to: All patients receiving prescriptions (written or  electronic).  Pharmacy of record: Pharmacy where electronic prescriptions will be sent. If written prescriptions are taken to a different pharmacy, please inform the nursing staff. The pharmacy listed in the electronic medical record should be the one where you would like electronic prescriptions to be sent.  Prescription refills: Only during scheduled appointments. Applies to both, written and electronic prescriptions.  NOTE: The following applies primarily to controlled substances (Opioid* Pain Medications).   Patient's responsibilities: 1. Pain Pills: Bring all pain pills to every appointment (except for procedure appointments). 2. Pill Bottles: Bring pills in original pharmacy bottle. Always bring newest bottle. Bring bottle, even if empty. 3. Medication refills: You are responsible for knowing and keeping track of what medications you need refilled. The day before your appointment, write a list of all prescriptions that need to be refilled. Bring that list to your appointment and give it to the admitting nurse. Prescriptions will be written only during appointments. If you forget a medication, it will not be "Called in", "Faxed", or "electronically sent". You will need to get another appointment to get these prescribed. 4. Prescription Accuracy: You are responsible for carefully inspecting your prescriptions before leaving our office. Have the discharge nurse carefully go over each prescription with you, before taking them home. Make sure that your name is accurately spelled, that your address is correct. Check the name and dose of your medication to make sure it is accurate. Check the number of pills, and the written instructions to make sure they are clear and accurate. Make sure that you are given enough medication to last until your next medication refill appointment. 5. Taking Medication: Take medication as prescribed. Never take more pills than instructed. Never take medication more  frequently than prescribed. Taking less pills or less frequently is permitted and encouraged, when it comes to controlled substances (written prescriptions).  6. Inform other Doctors: Always inform, all of your healthcare providers, of all the medications you take. 7. Pain Medication from other Providers: You are not allowed to accept any additional pain medication from any other Doctor or Healthcare provider. There are two exceptions to this rule. (see below) In the event that you require additional pain medication, you are responsible for notifying us, as stated below. 8. Medication Agreement: You are responsible for carefully reading and following our Medication Agreement. This must be signed before receiving any prescriptions from our practice. Safely store a copy of your signed Agreement. Violations to the Agreement will result in no further prescriptions. (Additional copies of our Medication Agreement are available upon request.) 9. Laws, Rules, & Regulations: All patients are expected to follow all Federal and [employer], [employer], Rules, [employer]. Ignorance of the Laws does not constitute a valid excuse. The use of any illegal substances is prohibited. 10. Adopted CDC guidelines & recommendations: Target dosing levels will be at or below 60 MME/day. Use of benzodiazepines** is not recommended.  Exceptions: There are only two exceptions to the rule of not receiving pain medications from other Healthcare Providers. 1. Exception #1 (Emergencies): In the event of an emergency (i.e.: accident requiring emergency care), you are allowed to receive additional pain medication. However, you are responsible for: As soon as you are able, call our office [phone], at any time of the day or night, and leave a message stating your name, the date and nature of the emergency, and the name and dose of the medication prescribed. In the event that your call is answered by a member of our staff, make sure to  document and save the date, time, and the name of the person that took your information.  2. Exception #2 (Planned Surgery): In the event that you are scheduled by another doctor or dentist to have any type of surgery or procedure, you  are allowed (for a period no longer than 30 days), to receive additional pain medication, for the acute post-op pain. However, in this case, you are responsible for picking up a copy of our "Post-op Pain Management for Surgeons" handout, and giving it to your surgeon or dentist. This document is available at our office, and does not require an appointment to obtain it. Simply go to our office during business hours (Monday-Thursday from 8:00 AM to 4:00 PM) (Friday 8:00 AM to 12:00 Noon) or if you have a scheduled appointment with [place], prior to your surgery, and ask for it by name. In addition, you will need to provide [place] with your name, name of your surgeon, type of surgery, and date of procedure or surgery.  *Opioid medications include: morphine, codeine, oxycodone, oxymorphone, hydrocodone, hydromorphone, meperidine, tramadol, tapentadol, buprenorphine, fentanyl, methadone. **Benzodiazepine medications include: diazepam (Valium), alprazolam (Xanax), clonazepam (Klonopine), lorazepam (Ativan), clorazepate (Tranxene), chlordiazepoxide (Librium), estazolam (Prosom), oxazepam (Serax), temazepam (Restoril), triazolam (Halcion) (Last updated: 05/20/2017) ____________________________________________________________________________________________    BMI Assessment: Estimated body mass index is 34.29 kg/m as calculated from the following:   Height as of this encounter: 5' 10"  (1.778 m).   Weight as of this encounter: 239 lb (108.4 kg).  BMI interpretation table: BMI level Category Range association with higher incidence of chronic pain  <18 kg/m2 Underweight   18.5-24.9 kg/m2 Ideal body weight   25-29.9 kg/m2 Overweight Increased incidence by 20%  30-34.9 kg/m2 Obese  (Class I) Increased incidence by 68%  35-39.9 kg/m2 Severe obesity (Class II) Increased incidence by 136%  >40 kg/m2 Extreme obesity (Class III) Increased incidence by 254%   Patient's current BMI Ideal Body weight  Body mass index is 34.29 kg/m. Ideal body weight: 73 kg (160 lb 15 oz) Adjusted ideal body weight: 87.2 kg (192 lb 2.6 oz)   BMI Readings from Last 4 Encounters:  11/18/17 34.29 kg/m  08/23/17 37.16 kg/m  08/12/17 37.26 kg/m  08/11/17 36.16 kg/m   Wt Readings from Last 4 Encounters:  11/18/17 239 lb (108.4 kg)  08/23/17 259 lb (117.5 kg)  08/12/17 259 lb 11.2 oz (117.8 kg)  08/11/17 252 lb (114.3 kg)

## 2017-11-18 NOTE — Patient Instructions (Addendum)
Melatonin has been escribed to your pharmacy.   You have been given 3 Rx for Oxycodone 5 mg to last until 03/08/2018.____________________________________________________________________________________________  Medication Rules  Applies to: All patients receiving prescriptions (written or electronic).  Pharmacy of record: Pharmacy where electronic prescriptions will be sent. If written prescriptions are taken to a different pharmacy, please inform the nursing staff. The pharmacy listed in the electronic medical record should be the one where you would like electronic prescriptions to be sent.  Prescription refills: Only during scheduled appointments. Applies to both, written and electronic prescriptions.  NOTE: The following applies primarily to controlled substances (Opioid* Pain Medications).   Patient's responsibilities: 1. Pain Pills: Bring all pain pills to every appointment (except for procedure appointments). 2. Pill Bottles: Bring pills in original pharmacy bottle. Always bring newest bottle. Bring bottle, even if empty. 3. Medication refills: You are responsible for knowing and keeping track of what medications you need refilled. The day before your appointment, write a list of all prescriptions that need to be refilled. Bring that list to your appointment and give it to the admitting nurse. Prescriptions will be written only during appointments. If you forget a medication, it will not be "Called in", "Faxed", or "electronically sent". You will need to get another appointment to get these prescribed. 4. Prescription Accuracy: You are responsible for carefully inspecting your prescriptions before leaving our office. Have the discharge nurse carefully go over each prescription with you, before taking them home. Make sure that your name is accurately spelled, that your address is correct. Check the name and dose of your medication to make sure it is accurate. Check the number of pills, and  the written instructions to make sure they are clear and accurate. Make sure that you are given enough medication to last until your next medication refill appointment. 5. Taking Medication: Take medication as prescribed. Never take more pills than instructed. Never take medication more frequently than prescribed. Taking less pills or less frequently is permitted and encouraged, when it comes to controlled substances (written prescriptions).  6. Inform other Doctors: Always inform, all of your healthcare providers, of all the medications you take. 7. Pain Medication from other Providers: You are not allowed to accept any additional pain medication from any other Doctor or Healthcare provider. There are two exceptions to this rule. (see below) In the event that you require additional pain medication, you are responsible for notifying us, as stated below. 8. Medication Agreement: You are responsible for carefully reading and following our Medication Agreement. This must be signed before receiving any prescriptions from our practice. Safely store a copy of your signed Agreement. Violations to the Agreement will result in no further prescriptions. (Additional copies of our Medication Agreement are available upon request.) 9. Laws, Rules, & Regulations: All patients are expected to follow all Federal and [employer], [employer], Rules, [employer]. Ignorance of the Laws does not constitute a valid excuse. The use of any illegal substances is prohibited. 10. Adopted CDC guidelines & recommendations: Target dosing levels will be at or below 60 MME/day. Use of benzodiazepines** is not recommended.  Exceptions: There are only two exceptions to the rule of not receiving pain medications from other Healthcare Providers. 1. Exception #1 (Emergencies): In the event of an emergency (i.e.: accident requiring emergency care), you are allowed to receive additional pain medication. However, you are responsible for: As soon as  you are able, call our office [phone], at any time of the day or night,  and leave a message stating your name, the date and nature of the emergency, and the name and dose of the medication prescribed. In the event that your call is answered by a member of our staff, make sure to document and save the date, time, and the name of the person that took your information.  2. Exception #2 (Planned Surgery): In the event that you are scheduled by another doctor or dentist to have any type of surgery or procedure, you are allowed (for a period no longer than 30 days), to receive additional pain medication, for the acute post-op pain. However, in this case, you are responsible for picking up a copy of our "Post-op Pain Management for Surgeons" handout, and giving it to your surgeon or dentist. This document is available at our office, and does not require an appointment to obtain it. Simply go to our office during business hours (Monday-Thursday from 8:00 AM to 4:00 PM) (Friday 8:00 AM to 12:00 Noon) or if you have a scheduled appointment with [place], prior to your surgery, and ask for it by name. In addition, you will need to provide [place] with your name, name of your surgeon, type of surgery, and date of procedure or surgery.  *Opioid medications include: morphine, codeine, oxycodone, oxymorphone, hydrocodone, hydromorphone, meperidine, tramadol, tapentadol, buprenorphine, fentanyl, methadone. **Benzodiazepine medications include: diazepam (Valium), alprazolam (Xanax), clonazepam (Klonopine), lorazepam (Ativan), clorazepate (Tranxene), chlordiazepoxide (Librium), estazolam (Prosom), oxazepam (Serax), temazepam (Restoril), triazolam (Halcion) (Last updated: 05/20/2017) ____________________________________________________________________________________________    BMI Assessment: Estimated body mass index is 34.29 kg/m as calculated from the following:   Height as of this encounter: 5\' 10"  (1.778 m).   Weight as  of this encounter: 239 lb (108.4 kg).  BMI interpretation table: BMI level Category Range association with higher incidence of chronic pain  <18 kg/m2 Underweight   18.5-24.9 kg/m2 Ideal body weight   25-29.9 kg/m2 Overweight Increased incidence by 20%  30-34.9 kg/m2 Obese (Class I) Increased incidence by 68%  35-39.9 kg/m2 Severe obesity (Class II) Increased incidence by 136%  >40 kg/m2 Extreme obesity (Class III) Increased incidence by 254%   Patient's current BMI Ideal Body weight  Body mass index is 34.29 kg/m. Ideal body weight: 73 kg (160 lb 15 oz) Adjusted ideal body weight: 87.2 kg (192 lb 2.6 oz)   BMI Readings from Last 4 Encounters:  11/18/17 34.29 kg/m  08/23/17 37.16 kg/m  08/12/17 37.26 kg/m  08/11/17 36.16 kg/m   Wt Readings from Last 4 Encounters:  11/18/17 239 lb (108.4 kg)  08/23/17 259 lb (117.5 kg)  08/12/17 259 lb 11.2 oz (117.8 kg)  08/11/17 252 lb (114.3 kg)

## 2017-11-18 NOTE — Progress Notes (Signed)
Nursing Pain Medication Assessment:  Safety precautions to be maintained throughout the outpatient stay will include: orient to surroundings, keep bed in low position, maintain call bell within reach at all times, provide assistance with transfer out of bed and ambulation.  Medication Inspection Compliance: Pill count conducted under aseptic conditions, in front of the patient. Neither the pills nor the bottle was removed from the patient's sight at any time. Once count was completed pills were immediately returned to the patient in their original bottle.  Medication: Oxycodone IR Pill/Patch Count: 82 of 120 pills remain Pill/Patch Appearance: Markings consistent with prescribed medication Bottle Appearance: Standard pharmacy container. Clearly labeled. Filled Date: 8 / 40 / 2019 Last Medication intake:  Today

## 2017-11-24 LAB — TOXASSURE SELECT 13 (MW), URINE

## 2017-12-07 DIAGNOSIS — M25551 Pain in right hip: Secondary | ICD-10-CM | POA: Insufficient documentation

## 2017-12-07 DIAGNOSIS — G8929 Other chronic pain: Secondary | ICD-10-CM | POA: Insufficient documentation

## 2017-12-15 ENCOUNTER — Other Ambulatory Visit: Payer: Self-pay | Admitting: Cardiovascular Disease

## 2017-12-20 ENCOUNTER — Other Ambulatory Visit (HOSPITAL_COMMUNITY): Payer: Self-pay | Admitting: Neurological Surgery

## 2017-12-20 ENCOUNTER — Other Ambulatory Visit: Payer: Self-pay | Admitting: Neurological Surgery

## 2017-12-20 DIAGNOSIS — M5416 Radiculopathy, lumbar region: Secondary | ICD-10-CM

## 2017-12-23 MED ORDER — SODIUM CHLORIDE 0.9 % IV SOLN: 4.0000 mg | Freq: Four times a day (QID) | INTRAVENOUS | Status: AC | PRN

## 2017-12-29 ENCOUNTER — Other Ambulatory Visit: Payer: Self-pay

## 2017-12-29 ENCOUNTER — Ambulatory Visit (HOSPITAL_COMMUNITY)
Admission: RE | Admit: 2017-12-29 | Discharge: 2017-12-29 | Disposition: A | Discharge status: Home / Self Care | Payer: Medicare Other | Source: Ambulatory Visit | Attending: Neurological Surgery | Admitting: Neurological Surgery

## 2017-12-29 ENCOUNTER — Other Ambulatory Visit (HOSPITAL_COMMUNITY): Payer: Medicare Other

## 2017-12-29 DIAGNOSIS — M2578 Osteophyte, vertebrae: Secondary | ICD-10-CM | POA: Diagnosis not present

## 2017-12-29 DIAGNOSIS — Z981 Arthrodesis status: Secondary | ICD-10-CM | POA: Diagnosis not present

## 2017-12-29 DIAGNOSIS — M48062 Spinal stenosis, lumbar region with neurogenic claudication: Secondary | ICD-10-CM | POA: Diagnosis present

## 2017-12-29 DIAGNOSIS — M5127 Other intervertebral disc displacement, lumbosacral region: Secondary | ICD-10-CM | POA: Insufficient documentation

## 2017-12-29 DIAGNOSIS — M5416 Radiculopathy, lumbar region: Secondary | ICD-10-CM

## 2017-12-29 LAB — GLUCOSE, CAPILLARY: Glucose-Capillary: 121 mg/dL — ABNORMAL HIGH (ref 70–99)

## 2017-12-29 MED ORDER — LIDOCAINE HCL (PF) 1 % IJ SOLN
5.0000 mL | Freq: Once | INTRAMUSCULAR | Status: AC
  Administered 2017-12-29: 5 mL via INTRADERMAL

## 2017-12-29 MED ORDER — ONDANSETRON HCL 4 MG/2ML IJ SOLN: 4.0000 mg | Freq: Four times a day (QID) | INTRAMUSCULAR | Status: DC | PRN

## 2017-12-29 MED ORDER — IOPAMIDOL (ISOVUE-M 200) INJECTION 41%
20.0000 mL | Freq: Once | INTRAMUSCULAR | Status: AC
  Administered 2017-12-29: 15 mL via INTRATHECAL

## 2017-12-29 MED ORDER — OXYCODONE-ACETAMINOPHEN 5-325 MG PO TABS
ORAL_TABLET | ORAL | Status: AC
  Filled 2017-12-29: qty 1

## 2017-12-29 MED ORDER — DIAZEPAM 5 MG PO TABS
10.0000 mg | ORAL_TABLET | Freq: Once | ORAL | Status: AC
  Administered 2017-12-29: 10 mg via ORAL
  Filled 2017-12-29: qty 2

## 2017-12-29 MED ORDER — HYDROCODONE-ACETAMINOPHEN 5-325 MG PO TABS: 1.0000 | ORAL_TABLET | ORAL | Status: DC | PRN

## 2017-12-29 MED ORDER — OXYCODONE-ACETAMINOPHEN 5-325 MG PO TABS
1.0000 | ORAL_TABLET | ORAL | Status: DC | PRN
  Administered 2017-12-29: 1 via ORAL

## 2017-12-29 MED ORDER — LIDOCAINE HCL (PF) 1 % IJ SOLN
INTRAMUSCULAR | Status: AC
  Filled 2017-12-29: qty 5

## 2017-12-29 MED ORDER — IOPAMIDOL (ISOVUE-M 200) INJECTION 41%
INTRAMUSCULAR | Status: AC
  Filled 2017-12-29: qty 10

## 2017-12-29 NOTE — Procedures (Addendum)
Howard Maxwell is a 72 year old individual whose had significant symptoms of lumbar spinal stenosis and has undergone a multilevel decompression using an indirect technique from L1-L5.  This was done via an anterolateral operation.  He has had some residual symptoms that have continued and he has been having continued difficulties with ambulation and significant neurogenic claudication and radiculopathy.  I advised a myelogram and post myelogram CAT scan to reevaluate his spinal stenosis and this is now being performed.  He has significant implanted titanium with cages placed at L1-L2 3 L3-4 and L4-5 which creates substantial amount of artifact on MRI  Pre op Dx: Lumbar stenosis with neurogenic claudication Post op Dx: Same Procedure: Lumbar myelogram Surgeon: Neyda Durango Puncture level: L3-4 Fluid color: Clear colorless Injection: Isovue-300, 15 mL Findings: Relative stenosis at L2-3.  Further evaluation with CT scanning

## 2017-12-29 NOTE — Discharge Instructions (Signed)
Myelogram, Care After °Refer to this sheet in the next few weeks. These instructions provide you with information about caring for yourself after your procedure. Your health care provider may also give you more specific instructions. Your treatment has been planned according to current medical practices, but problems sometimes occur. Call your health care provider if you have any problems or questions after your procedure. °What can I expect after the procedure? °After the procedure, it is common to have: °· Soreness at your injection site. °· A mild headache. ° °Follow these instructions at home: °· Drink enough fluid to keep your urine clear or pale yellow. This will help flush out the dye (contrast material) from your spine. °· Rest as told by your health care provider. Lie flat with your head slightly raised (elevated) to reduce the risk of headache. °· Do not bend, lift, or do any strenuous activity for 24-48 hours or as told by your health care provider. °· Take over-the-counter and prescription medicines only as told by your health care provider. °· Take care of and remove your bandage (dressing) as told by your health care provider. °· Bathe or shower as told by your health care provider. °Contact a health care provider if: °· You have a fever. °· You have a headache that lasts longer than 24 hours. °· You feel nauseous or vomit. °· You have a stiff neck or numbness in your legs. °· You are unable to urinate or have a bowel movement. °· You develop a rash, itching, or sneezing. °Get help right away if: °· You have new symptoms or your symptoms get worse. °· You have a seizure. °· You have trouble breathing. °This information is not intended to replace advice given to you by your health care provider. Make sure you discuss any questions you have with your health care provider. °Document Released: 04/05/2015 Document Revised: 08/15/2015 Document Reviewed: 12/20/2014 °[employer]vier Interactive Patient Education ©  2018 [employer] ° °

## 2018-01-11 ENCOUNTER — Telehealth: Payer: Self-pay | Admitting: Cardiovascular Disease

## 2018-01-11 ENCOUNTER — Other Ambulatory Visit: Payer: Self-pay | Admitting: *Deleted

## 2018-01-11 MED ORDER — LOSARTAN POTASSIUM-HCTZ 100-12.5 MG PO TABS: 1.0000 | ORAL_TABLET | Freq: Every day | ORAL | 2 refills | Status: DC

## 2018-01-11 NOTE — Telephone Encounter (Signed)
Requested Prescriptions   Signed Prescriptions Disp Refills  . losartan-hydrochlorothiazide (HYZAAR) 100-12.5 MG tablet 90 tablet 2    Sig: Take 1 tablet by mouth daily.    Authorizing Provider: Kathlyn Sacramento A    Ordering User: Britt Bottom

## 2018-01-11 NOTE — Telephone Encounter (Signed)
°*  STAT* If patient is at the pharmacy, call can be transferred to refill team.   1. Which medications need to be refilled? (please list name of each medication and dose if known)  Losartan hctz 100-12.5 mg po q d   2. Which pharmacy/location (including street and city if local pharmacy) is medication to be sent to? [address]   3. Do they need a 30 day or 90 day supply? [place]

## 2018-01-24 NOTE — Telephone Encounter (Signed)
Patient calling stating he was told by his Pharmacy that the losartan-hctz they don't carry that combination anymore  They can do just the Losartan but not with the HCTZ   Please advise

## 2018-01-24 NOTE — Telephone Encounter (Signed)
I spoke with Corene Cornea from [facility] and he mentioned that pt's medication is currently on BO. They mentioned that they would be able to give pt medication pills separately. Losartan 100 mg tablet and HCTZ 12.5 mg tablet. Pt would have to take two separate pills.  Please advise.

## 2018-01-25 NOTE — Telephone Encounter (Signed)
Was on hold for about 38 minutes with [facility] pharmacy. Spoke with Chelsie, pharmacist, concerning patient's combination pill. I gave verbal ok to split the losartan/ HCTZ into two tablets for now. They will make the patient aware upon filling the prescription.

## 2018-02-04 HISTORY — PX: EYE SURGERY: SHX253

## 2018-02-09 ENCOUNTER — Encounter: Payer: Self-pay | Admitting: Nurse Practitioner

## 2018-02-09 ENCOUNTER — Ambulatory Visit: Payer: Medicare Other | Attending: Nurse Practitioner | Admitting: Nurse Practitioner

## 2018-02-09 VITALS — BP 141/74 | HR 63 | Temp 97.7°F | Resp 16 | Ht 70.0 in | Wt 245.0 lb

## 2018-02-09 DIAGNOSIS — E669 Obesity, unspecified: Secondary | ICD-10-CM | POA: Insufficient documentation

## 2018-02-09 DIAGNOSIS — M542 Cervicalgia: Secondary | ICD-10-CM | POA: Diagnosis not present

## 2018-02-09 DIAGNOSIS — M79601 Pain in right arm: Secondary | ICD-10-CM | POA: Diagnosis not present

## 2018-02-09 DIAGNOSIS — M7138 Other bursal cyst, other site: Secondary | ICD-10-CM | POA: Diagnosis not present

## 2018-02-09 DIAGNOSIS — K219 Gastro-esophageal reflux disease without esophagitis: Secondary | ICD-10-CM | POA: Diagnosis not present

## 2018-02-09 DIAGNOSIS — Z6835 Body mass index (BMI) 35.0-35.9, adult: Secondary | ICD-10-CM | POA: Insufficient documentation

## 2018-02-09 DIAGNOSIS — G4730 Sleep apnea, unspecified: Secondary | ICD-10-CM | POA: Diagnosis not present

## 2018-02-09 DIAGNOSIS — M6530 Trigger finger, unspecified finger: Secondary | ICD-10-CM | POA: Diagnosis not present

## 2018-02-09 DIAGNOSIS — M4826 Kissing spine, lumbar region: Secondary | ICD-10-CM | POA: Insufficient documentation

## 2018-02-09 DIAGNOSIS — M160 Bilateral primary osteoarthritis of hip: Secondary | ICD-10-CM | POA: Insufficient documentation

## 2018-02-09 DIAGNOSIS — J449 Chronic obstructive pulmonary disease, unspecified: Secondary | ICD-10-CM | POA: Insufficient documentation

## 2018-02-09 DIAGNOSIS — G894 Chronic pain syndrome: Secondary | ICD-10-CM | POA: Diagnosis not present

## 2018-02-09 DIAGNOSIS — Z5181 Encounter for therapeutic drug level monitoring: Secondary | ICD-10-CM | POA: Insufficient documentation

## 2018-02-09 DIAGNOSIS — Z888 Allergy status to other drugs, medicaments and biological substances status: Secondary | ICD-10-CM | POA: Insufficient documentation

## 2018-02-09 DIAGNOSIS — M5127 Other intervertebral disc displacement, lumbosacral region: Secondary | ICD-10-CM | POA: Insufficient documentation

## 2018-02-09 DIAGNOSIS — I471 Supraventricular tachycardia: Secondary | ICD-10-CM | POA: Insufficient documentation

## 2018-02-09 DIAGNOSIS — M4626 Osteomyelitis of vertebra, lumbar region: Secondary | ICD-10-CM | POA: Insufficient documentation

## 2018-02-09 DIAGNOSIS — M7918 Myalgia, other site: Secondary | ICD-10-CM | POA: Diagnosis not present

## 2018-02-09 DIAGNOSIS — M5116 Intervertebral disc disorders with radiculopathy, lumbar region: Secondary | ICD-10-CM | POA: Insufficient documentation

## 2018-02-09 DIAGNOSIS — D649 Anemia, unspecified: Secondary | ICD-10-CM | POA: Diagnosis not present

## 2018-02-09 DIAGNOSIS — E1140 Type 2 diabetes mellitus with diabetic neuropathy, unspecified: Secondary | ICD-10-CM | POA: Insufficient documentation

## 2018-02-09 DIAGNOSIS — M1712 Unilateral primary osteoarthritis, left knee: Secondary | ICD-10-CM | POA: Diagnosis not present

## 2018-02-09 DIAGNOSIS — Z9049 Acquired absence of other specified parts of digestive tract: Secondary | ICD-10-CM | POA: Insufficient documentation

## 2018-02-09 DIAGNOSIS — M48062 Spinal stenosis, lumbar region with neurogenic claudication: Secondary | ICD-10-CM | POA: Insufficient documentation

## 2018-02-09 DIAGNOSIS — I25110 Atherosclerotic heart disease of native coronary artery with unstable angina pectoris: Secondary | ICD-10-CM | POA: Insufficient documentation

## 2018-02-09 DIAGNOSIS — M4712 Other spondylosis with myelopathy, cervical region: Secondary | ICD-10-CM | POA: Insufficient documentation

## 2018-02-09 DIAGNOSIS — E559 Vitamin D deficiency, unspecified: Secondary | ICD-10-CM | POA: Diagnosis not present

## 2018-02-09 DIAGNOSIS — Z7984 Long term (current) use of oral hypoglycemic drugs: Secondary | ICD-10-CM | POA: Insufficient documentation

## 2018-02-09 DIAGNOSIS — E785 Hyperlipidemia, unspecified: Secondary | ICD-10-CM | POA: Insufficient documentation

## 2018-02-09 DIAGNOSIS — M4802 Spinal stenosis, cervical region: Secondary | ICD-10-CM | POA: Diagnosis not present

## 2018-02-09 DIAGNOSIS — R350 Frequency of micturition: Secondary | ICD-10-CM | POA: Insufficient documentation

## 2018-02-09 DIAGNOSIS — N529 Male erectile dysfunction, unspecified: Secondary | ICD-10-CM | POA: Insufficient documentation

## 2018-02-09 DIAGNOSIS — M79602 Pain in left arm: Secondary | ICD-10-CM | POA: Insufficient documentation

## 2018-02-09 DIAGNOSIS — Z885 Allergy status to narcotic agent status: Secondary | ICD-10-CM | POA: Insufficient documentation

## 2018-02-09 DIAGNOSIS — Z79891 Long term (current) use of opiate analgesic: Secondary | ICD-10-CM | POA: Insufficient documentation

## 2018-02-09 DIAGNOSIS — R531 Weakness: Secondary | ICD-10-CM | POA: Insufficient documentation

## 2018-02-09 DIAGNOSIS — Z87891 Personal history of nicotine dependence: Secondary | ICD-10-CM | POA: Insufficient documentation

## 2018-02-09 DIAGNOSIS — I10 Essential (primary) hypertension: Secondary | ICD-10-CM | POA: Insufficient documentation

## 2018-02-09 DIAGNOSIS — M5416 Radiculopathy, lumbar region: Secondary | ICD-10-CM

## 2018-02-09 DIAGNOSIS — I252 Old myocardial infarction: Secondary | ICD-10-CM | POA: Insufficient documentation

## 2018-02-09 DIAGNOSIS — Z79899 Other long term (current) drug therapy: Secondary | ICD-10-CM | POA: Insufficient documentation

## 2018-02-09 MED ORDER — OXYCODONE HCL 5 MG PO TABS: 5.0000 mg | ORAL_TABLET | Freq: Four times a day (QID) | ORAL | 0 refills | Status: DC | PRN

## 2018-02-09 NOTE — Progress Notes (Signed)
Nursing Pain Medication Assessment:  Safety precautions to be maintained throughout the outpatient stay will include: orient to surroundings, keep bed in low position, maintain call bell within reach at all times, provide assistance with transfer out of bed and ambulation.  Medication Inspection Compliance: Pill count conducted under aseptic conditions, in front of the patient. Neither the pills nor the bottle was removed from the patient's sight at any time. Once count was completed pills were immediately returned to the patient in their original bottle.  Medication: Oxycodone IR Pill/Patch Count: 111 of 120 pills remain Pill/Patch Appearance: Markings consistent with prescribed medication Bottle Appearance: Standard pharmacy container. Clearly labeled. Filled Date: 23 / 18 / 2019 Last Medication intake:  Today

## 2018-02-09 NOTE — Patient Instructions (Addendum)
_You have been given 3 written prescriptions for Oxycodone 5mg .  ___________________________________________________________________________________________  CANNABIDIOL (AKA: CBD Oil or Pills)  Applies to: All patients receiving prescriptions of controlled substances (written and/or electronic).  General Information: Cannabidiol (CBD) was discovered in [birth year]. It is one of some 113 identified cannabinoids in cannabis (Marijuana) plants, accounting for up to 40% of the plant's extract. As of 2018, preliminary clinical research on cannabidiol included studies of anxiety, cognition, movement disorders, and pain.  Cannabidiol is consummed in multiple ways, including inhalation of cannabis smoke or vapor, as an aerosol spray into the cheek, and by mouth. It may be supplied as CBD oil containing CBD as the active ingredient (no added tetrahydrocannabinol (THC) or terpenes), a full-plant CBD-dominant hemp extract oil, capsules, dried cannabis, or as a liquid solution. CBD is thought not have the same psychoactivity as THC, and may affect the actions of THC. Studies suggest that CBD may interact with different biological targets, including cannabinoid receptors and other neurotransmitter receptors. As of 2018 the mechanism of action for its biological effects has not been determined.  In the [place], cannabidiol has a limited approval by the Food and Drug Administration (FDA) for treatment of only two types of epilepsy disorders. The side effects of long-term use of the drug include somnolence, decreased appetite, diarrhea, fatigue, malaise, weakness, sleeping problems, and others.  CBD remains a Schedule I drug prohibited for any use.  Legality: Some manufacturers ship CBD products nationally, an illegal action which the FDA has not enforced in 2018, with CBD remaining the subject of an FDA investigational new drug evaluation, and is not considered legal as a dietary supplement or food ingredient as of  December 2018. Federal illegality has made it difficult historically to conduct research on CBD. CBD is openly sold in head shops and health food stores in some states where such sales have not been explicitly legalized.  Warning: Because it is not FDA approved for general use or treatment of pain, it is not required to undergo the same manufacturing controls as prescription drugs.  This means that the available cannabidiol (CBD) may be contaminated with THC.  If this is the case, it will trigger a positive urine drug screen (UDS) test for cannabinoids (Marijuana).  Because a positive UDS for illicit substances is a violation of our medication agreement, your opioid analgesics (pain medicine) may be permanently discontinued. (Last update: 06/10/2017) ____________________________________________________________________________________________  ____________________________________________________________________________________________  CANNABIDIOL (AKA: CBD Oil or Pills)  Applies to: All patients receiving prescriptions of controlled substances (written and/or electronic).  General Information: Cannabidiol (CBD) was discovered in [birth year]. It is one of some 113 identified cannabinoids in cannabis (Marijuana) plants, accounting for up to 40% of the plant's extract. As of 2018, preliminary clinical research on cannabidiol included studies of anxiety, cognition, movement disorders, and pain.  Cannabidiol is consummed in multiple ways, including inhalation of cannabis smoke or vapor, as an aerosol spray into the cheek, and by mouth. It may be supplied as CBD oil containing CBD as the active ingredient (no added tetrahydrocannabinol (THC) or terpenes), a full-plant CBD-dominant hemp extract oil, capsules, dried cannabis, or as a liquid solution. CBD is thought not have the same psychoactivity as THC, and may affect the actions of THC. Studies suggest that CBD may interact with different biological targets,  including cannabinoid receptors and other neurotransmitter receptors. As of 2018 the mechanism of action for its biological effects has not been determined.  In the [place], cannabidiol has a limited approval by  the Food and Drug Administration (FDA) for treatment of only two types of epilepsy disorders. The side effects of long-term use of the drug include somnolence, decreased appetite, diarrhea, fatigue, malaise, weakness, sleeping problems, and others.  CBD remains a Schedule I drug prohibited for any use.  Legality: Some manufacturers ship CBD products nationally, an illegal action which the FDA has not enforced in 2018, with CBD remaining the subject of an FDA investigational new drug evaluation, and is not considered legal as a dietary supplement or food ingredient as of December 2018. Federal illegality has made it difficult historically to conduct research on CBD. CBD is openly sold in head shops and health food stores in some states where such sales have not been explicitly legalized.  Warning: Because it is not FDA approved for general use or treatment of pain, it is not required to undergo the same manufacturing controls as prescription drugs.  This means that the available cannabidiol (CBD) may be contaminated with THC.  If this is the case, it will trigger a positive urine drug screen (UDS) test for cannabinoids (Marijuana).  Because a positive UDS for illicit substances is a violation of our medication agreement, your opioid analgesics (pain medicine) may be permanently discontinued. (Last update: 06/10/2017) ____________________________________________________________________________________________   BMI Assessment: Estimated body mass index is 35.15 kg/m as calculated from the following:   Height as of this encounter: 5\' 10"  (1.778 m).   Weight as of this encounter: 245 lb (111.1 kg).  BMI interpretation table: BMI level Category Range association with higher incidence of  chronic pain  <18 kg/m2 Underweight   18.5-24.9 kg/m2 Ideal body weight   25-29.9 kg/m2 Overweight Increased incidence by 20%  30-34.9 kg/m2 Obese (Class I) Increased incidence by 68%  35-39.9 kg/m2 Severe obesity (Class II) Increased incidence by 136%  >40 kg/m2 Extreme obesity (Class III) Increased incidence by 254%   Patient's current BMI Ideal Body weight  Body mass index is 35.15 kg/m. Ideal body weight: 73 kg (160 lb 15 oz) Adjusted ideal body weight: 88.3 kg (194 lb 9 oz)   BMI Readings from Last 4 Encounters:  02/09/18 35.15 kg/m  12/29/17 35.15 kg/m  11/18/17 34.29 kg/m  08/23/17 37.16 kg/m   Wt Readings from Last 4 Encounters:  02/09/18 245 lb (111.1 kg)  12/29/17 245 lb (111.1 kg)  11/18/17 239 lb (108.4 kg)  08/23/17 259 lb (117.5 kg)

## 2018-02-09 NOTE — Progress Notes (Signed)
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 administered during this visit. Lab-work, procedure(s), and/or referral(s): No orders of the defined types were placed in this encounter.  Imaging and/or referral(s): None Interventional management options: Planned, scheduled, and/or pending: Not at this time.   Considering: Diagnostic bilateral lumbar facetblock  Possible bilateral lumbar facet RFA Diagnostic L3-4 interspinous ligament block Possible L3-4 bilateral medial branch RFA Diagnostic right sided L2-3 and L3-4 lumbar epiduralsteroid injection  Diagnostic bilateral L1-2 &L4-5 transforaminalepidural steroid injection  Diagnostic bilateral L2-3 transforaminal epidural steroid injection Diagnostic caudal epidural steroid injection + diagnostic epidurogram Possible Racz procedure Diagnostic bilateral intra-articular hipjoint injection  Diagnostic bilateral femoral nerve and  obturator nerveblock  Possible bilateral femoral nerve and obturator nerve RFA Diagnostic left cervical epidural steroid injection  Diagnostic bilateral cervical facet block Possible bilateral cervical facet RFA Possible series of 5 left intra-articular Hyalganknee injection  Diagnostic left Genicular nerve block Possible left Genicular nerve radiofrequencyablation  Possible intrathecal opioid trial   Palliative PRN treatment(s): None at this time    Provider-requested follow-up: Return in about 3 months (around 05/12/2018) for MedMgmt.  Future Appointments  Date Time Provider [place]  05/26/2018  9:30 AM Vevelyn Francois, NP [facility] None   Primary Care Physician: Tracie Harrier, MD Location: [facility] Note by: Vevelyn Francois NP Date: 02/09/2018; Time: 10:49 AM  Pain Score Disclaimer: We use the NRS-11 scale. This is a self-reported, subjective measurement of pain severity with only modest accuracy. It is used primarily to identify changes within a particular patient. It must be understood that outpatient pain scales are significantly less accurate that those used for research, where they can be applied under ideal controlled circumstances with minimal exposure to variables. In reality, the score is likely to be a combination of pain intensity and pain affect, where pain affect describes the degree of emotional arousal or changes in action readiness caused by the sensory experience of pain. Factors such as social and work situation, setting, emotional state, anxiety levels, expectation, and prior pain experience may influence pain perception and show large inter-individual differences that may also be affected by time variables.  Patient instructions provided during this appointment: Patient Instructions   _You have been given 3 written prescriptions for Oxycodone 79m.   ___________________________________________________________________________________________  CANNABIDIOL (AKA: CBD Oil or Pills)  Applies to: All patients receiving prescriptions of controlled substances (written and/or electronic).  General Information: Cannabidiol (CBD) was discovered in 167 It is one of some 113 identified cannabinoids in cannabis (Marijuana) plants, accounting for up to 40% of the plant's extract. As of 2018, preliminary clinical research on cannabidiol included studies of anxiety, cognition, movement disorders, and pain.  Cannabidiol is consummed in multiple ways, including inhalation of cannabis smoke or vapor, as an aerosol spray into the cheek, and by mouth. It may  be supplied as CBD oil containing CBD as the active ingredient (no added tetrahydrocannabinol (THC) or terpenes), a full-plant CBD-dominant hemp extract oil, capsules, dried cannabis, or as a liquid solution. CBD is thought not have the same psychoactivity as THC, and may affect the actions of THC. Studies suggest that CBD may interact with different biological targets, including cannabinoid receptors and other neurotransmitter receptors. As of 2018 the mechanism of action for its biological effects has not been determined.  In the [place], cannabidiol has a limited approval by the Food and Drug Administration (FDA) for treatment of only two types of epilepsy disorders. The side effects of long-term use of the drug include somnolence, decreased appetite, diarrhea, fatigue, malaise, weakness, sleeping problems, and others.  CBD remains a Schedule I drug prohibited for any use.  Legality: Some manufacturers ship CBD products nationally, an illegal action which the FDA has not enforced in 2018, with CBD remaining the subject of an FDA investigational new drug evaluation, and is not considered legal as a dietary supplement or food ingredient as of December 2018. Federal illegality has made it difficult  historically to conduct research on CBD. CBD is openly sold in head shops and health food stores in some states where such sales have not been explicitly legalized.  Warning: Because it is not FDA approved for general use or treatment of pain, it is not required to undergo the same manufacturing controls as prescription drugs.  This means that the available cannabidiol (CBD) may be contaminated with THC.  If this is the case, it will trigger a positive urine drug screen (UDS) test for cannabinoids (Marijuana).  Because a positive UDS for illicit substances is a violation of our medication agreement, your opioid analgesics (pain medicine) may be permanently discontinued. (Last update: 06/10/2017) ____________________________________________________________________________________________  ____________________________________________________________________________________________  CANNABIDIOL (AKA: CBD Oil or Pills)  Applies to: All patients receiving prescriptions of controlled substances (written and/or electronic).  General Information: Cannabidiol (CBD) was discovered in 30. It is one of some 113 identified cannabinoids in cannabis (Marijuana) plants, accounting for up to 40% of the plant's extract. As of 2018, preliminary clinical research on cannabidiol included studies of anxiety, cognition, movement disorders, and pain.  Cannabidiol is consummed in multiple ways, including inhalation of cannabis smoke or vapor, as an aerosol spray into the cheek, and by mouth. It may be supplied as CBD oil containing CBD as the active ingredient (no added tetrahydrocannabinol (THC) or terpenes), a full-plant CBD-dominant hemp extract oil, capsules, dried cannabis, or as a liquid solution. CBD is thought not have the same psychoactivity as THC, and may affect the actions of THC. Studies suggest that CBD may interact with different biological targets, including cannabinoid receptors and other neurotransmitter  receptors. As of 2018 the mechanism of action for its biological effects has not been determined.  In the [place], cannabidiol has a limited approval by the Food and Drug Administration (FDA) for treatment of only two types of epilepsy disorders. The side effects of long-term use of the drug include somnolence, decreased appetite, diarrhea, fatigue, malaise, weakness, sleeping problems, and others.  CBD remains a Schedule I drug prohibited for any use.  Legality: Some manufacturers ship CBD products nationally, an illegal action which the FDA has not enforced in 2018, with CBD remaining the subject of an FDA investigational new drug evaluation, and is not considered legal as a dietary supplement or food ingredient as of December 2018. Federal illegality has made it difficult historically to conduct research on  CBD. CBD is openly sold in head shops and health food stores in some states where such sales have not been explicitly legalized.  Warning: Because it is not FDA approved for general use or treatment of pain, it is not required to undergo the same manufacturing controls as prescription drugs.  This means that the available cannabidiol (CBD) may be contaminated with THC.  If this is the case, it will trigger a positive urine drug screen (UDS) test for cannabinoids (Marijuana).  Because a positive UDS for illicit substances is a violation of our medication agreement, your opioid analgesics (pain medicine) may be permanently discontinued. (Last update: 06/10/2017) ____________________________________________________________________________________________   BMI Assessment: Estimated body mass index is 35.15 kg/m as calculated from the following:   Height as of this encounter: 5' 10"  (1.778 m).   Weight as of this encounter: 245 lb (111.1 kg).  BMI interpretation table: BMI level Category Range association with higher incidence of chronic pain  <18 kg/m2 Underweight   18.5-24.9 kg/m2  Ideal body weight   25-29.9 kg/m2 Overweight Increased incidence by 20%  30-34.9 kg/m2 Obese (Class I) Increased incidence by 68%  35-39.9 kg/m2 Severe obesity (Class II) Increased incidence by 136%  >40 kg/m2 Extreme obesity (Class III) Increased incidence by 254%   Patient's current BMI Ideal Body weight  Body mass index is 35.15 kg/m. Ideal body weight: 73 kg (160 lb 15 oz) Adjusted ideal body weight: 88.3 kg (194 lb 9 oz)   BMI Readings from Last 4 Encounters:  02/09/18 35.15 kg/m  12/29/17 35.15 kg/m  11/18/17 34.29 kg/m  08/23/17 37.16 kg/m   Wt Readings from Last 4 Encounters:  02/09/18 245 lb (111.1 kg)  12/29/17 245 lb (111.1 kg)  11/18/17 239 lb (108.4 kg)  08/23/17 259 lb (117.5 kg)

## 2018-02-27 ENCOUNTER — Other Ambulatory Visit: Payer: Self-pay | Admitting: Cardiovascular Disease

## 2018-04-06 ENCOUNTER — Telehealth: Payer: Self-pay | Admitting: Nurse Practitioner

## 2018-04-06 NOTE — Telephone Encounter (Signed)
[facility] will not accept C[facility] prescriptions from NP. Pharmacy to call patient and make aware.

## 2018-04-06 NOTE — Telephone Encounter (Signed)
[facility] called and wanted to verify pts prescription for oxy 5mg .

## 2018-04-20 ENCOUNTER — Telehealth: Payer: Self-pay

## 2018-04-20 MED ORDER — LOSARTAN POTASSIUM 100 MG PO TABS: 100.0000 mg | ORAL_TABLET | Freq: Every day | ORAL | 3 refills | Status: DC

## 2018-04-20 MED ORDER — HYDROCHLOROTHIAZIDE 12.5 MG PO TABS: 12.5000 mg | ORAL_TABLET | Freq: Every day | ORAL | 3 refills | Status: DC

## 2018-04-20 NOTE — Telephone Encounter (Signed)
[facility] requesting a refill on HCTZ 12.5 mg take one tablet daily and Losartan 100 mg one tablet daily  90 day supply and 3 refills. New Rx sent due to combination Losartan/HCTZ on back order per Lars Pinks, RN from  phone call dated 01/25/2018, she had given a verbal to the pharmacy.

## 2018-05-01 ENCOUNTER — Other Ambulatory Visit: Payer: Self-pay | Admitting: Cardiovascular Disease

## 2018-05-12 ENCOUNTER — Encounter: Payer: Medicare Other | Admitting: Nurse Practitioner

## 2018-05-26 ENCOUNTER — Ambulatory Visit: Payer: Medicare Other | Attending: Nurse Practitioner | Admitting: Nurse Practitioner

## 2018-05-26 ENCOUNTER — Other Ambulatory Visit: Payer: Self-pay

## 2018-05-26 ENCOUNTER — Encounter: Payer: Self-pay | Admitting: Nurse Practitioner

## 2018-05-26 VITALS — BP 113/81 | HR 59 | Temp 97.7°F | Ht 70.0 in | Wt 250.0 lb

## 2018-05-26 DIAGNOSIS — M5416 Radiculopathy, lumbar region: Secondary | ICD-10-CM | POA: Insufficient documentation

## 2018-05-26 DIAGNOSIS — M5136 Other intervertebral disc degeneration, lumbar region: Secondary | ICD-10-CM | POA: Diagnosis present

## 2018-05-26 DIAGNOSIS — M1712 Unilateral primary osteoarthritis, left knee: Secondary | ICD-10-CM | POA: Diagnosis present

## 2018-05-26 DIAGNOSIS — G894 Chronic pain syndrome: Secondary | ICD-10-CM | POA: Diagnosis not present

## 2018-05-26 MED ORDER — OXYCODONE HCL 5 MG PO TABS: 5.0000 mg | ORAL_TABLET | Freq: Four times a day (QID) | ORAL | 0 refills | Status: DC | PRN

## 2018-05-26 NOTE — Progress Notes (Signed)
Nursing Pain Medication Assessment:  Safety precautions to be maintained throughout the outpatient stay will include: orient to surroundings, keep bed in low position, maintain call bell within reach at all times, provide assistance with transfer out of bed and ambulation.  Medication Inspection Compliance: Pill count conducted under aseptic conditions, in front of the patient. Neither the pills nor the bottle was removed from the patient's sight at any time. Once count was completed pills were immediately returned to the patient in their original bottle.  Medication: Oxycodone IR Pill/Patch Count: 44 of 120 pills remain Pill/Patch Appearance: Markings consistent with prescribed medication Bottle Appearance: Standard pharmacy container. Clearly labeled. Filled Date: 2 / 45 / 2020 Last Medication intake:  Today

## 2018-05-26 NOTE — Patient Instructions (Signed)
____________________________________________________________________________________________  Medication Rules  Purpose: To inform patients, and their family members, of our rules and regulations.  Applies to: All patients receiving prescriptions (written or electronic).  Pharmacy of record: Pharmacy where electronic prescriptions will be sent. If written prescriptions are taken to a different pharmacy, please inform the nursing staff. The pharmacy listed in the electronic medical record should be the one where you would like electronic prescriptions to be sent.  Electronic prescriptions: In compliance with the [place] Strengthen Opioid Misuse Prevention (STOP) Act of 2017 (Session Law 2017-74/H243), effective March 23, 2018, all controlled substances must be electronically prescribed. Calling prescriptions to the pharmacy will cease to exist.  Prescription refills: Only during scheduled appointments. Applies to all prescriptions.  NOTE: The following applies primarily to controlled substances (Opioid* Pain Medications).   Patient's responsibilities: 1. Pain Pills: Bring all pain pills to every appointment (except for procedure appointments). 2. Pill Bottles: Bring pills in original pharmacy bottle. Always bring the newest bottle. Bring bottle, even if empty. 3. Medication refills: You are responsible for knowing and keeping track of what medications you take and those you need refilled. The day before your appointment: write a list of all prescriptions that need to be refilled. The day of the appointment: give the list to the admitting nurse. Prescriptions will be written only during appointments. No prescriptions will be written on procedure days. If you forget a medication: it will not be "Called in", "Faxed", or "electronically sent". You will need to get another appointment to get these prescribed. No early refills. Do not call asking to have your prescription filled  early. 4. Prescription Accuracy: You are responsible for carefully inspecting your prescriptions before leaving our office. Have the discharge nurse carefully go over each prescription with you, before taking them home. Make sure that your name is accurately spelled, that your address is correct. Check the name and dose of your medication to make sure it is accurate. Check the number of pills, and the written instructions to make sure they are clear and accurate. Make sure that you are given enough medication to last until your next medication refill appointment. 5. Taking Medication: Take medication as prescribed. When it comes to controlled substances, taking less pills or less frequently than prescribed is permitted and encouraged. Never take more pills than instructed. Never take medication more frequently than prescribed.  6. Inform other Doctors: Always inform, all of your healthcare providers, of all the medications you take. 7. Pain Medication from other Providers: You are not allowed to accept any additional pain medication from any other Doctor or Healthcare provider. There are two exceptions to this rule. (see below) In the event that you require additional pain medication, you are responsible for notifying us, as stated below. 8. Medication Agreement: You are responsible for carefully reading and following our Medication Agreement. This must be signed before receiving any prescriptions from our practice. Safely store a copy of your signed Agreement. Violations to the Agreement will result in no further prescriptions. (Additional copies of our Medication Agreement are available upon request.) 9. Laws, Rules, & Regulations: All patients are expected to follow all Federal and State Laws, Statutes, Rules, & Regulations. Ignorance of the Laws does not constitute a valid excuse. The use of any illegal substances is prohibited. 10. Adopted CDC guidelines & recommendations: Target dosing levels will be  at or below 60 MME/day. Use of benzodiazepines** is not recommended.  Exceptions: There are only two exceptions to the rule of not   receiving pain medications from other Healthcare Providers. 1. Exception #1 (Emergencies): In the event of an emergency (i.e.: accident requiring emergency care), you are allowed to receive additional pain medication. However, you are responsible for: As soon as you are able, call our office [phone], at any time of the day or night, and leave a message stating your name, the date and nature of the emergency, and the name and dose of the medication prescribed. In the event that your call is answered by a member of our staff, make sure to document and save the date, time, and the name of the person that took your information.  2. Exception #2 (Planned Surgery): In the event that you are scheduled by another doctor or dentist to have any type of surgery or procedure, you are allowed (for a period no longer than 30 days), to receive additional pain medication, for the acute post-op pain. However, in this case, you are responsible for picking up a copy of our "Post-op Pain Management for Surgeons" handout, and giving it to your surgeon or dentist. This document is available at our office, and does not require an appointment to obtain it. Simply go to our office during business hours (Monday-Thursday from 8:00 AM to 4:00 PM) (Friday 8:00 AM to 12:00 Noon) or if you have a scheduled appointment with us, prior to your surgery, and ask for it by name. In addition, you will need to provide us with your name, name of your surgeon, type of surgery, and date of procedure or surgery.  *Opioid medications include: morphine, codeine, oxycodone, oxymorphone, hydrocodone, hydromorphone, meperidine, tramadol, tapentadol, buprenorphine, fentanyl, methadone. **Benzodiazepine medications include: diazepam (Valium), alprazolam (Xanax), clonazepam (Klonopine), lorazepam (Ativan), clorazepate  (Tranxene), chlordiazepoxide (Librium), estazolam (Prosom), oxazepam (Serax), temazepam (Restoril), triazolam (Halcion) (Last updated: 05/20/2017) ____________________________________________________________________________________________    
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
## 2018-06-08 DIAGNOSIS — M79645 Pain in left finger(s): Secondary | ICD-10-CM | POA: Insufficient documentation

## 2018-06-08 DIAGNOSIS — M65332 Trigger finger, left middle finger: Secondary | ICD-10-CM | POA: Insufficient documentation

## 2018-06-27 IMAGING — CR DG HIP (WITH OR WITHOUT PELVIS) 2-3V*R*
3 series · 4 of 4 positions shown · non-contrast
Comparison: None.

CLINICAL DATA: Pain without trauma

EXAM:
DG HIP (WITH OR WITHOUT PELVIS) 2-3V RIGHT

[Series 1: pelvis ap · 0.14mm/px · 2 of 2 slices shown]
[im 1/2]
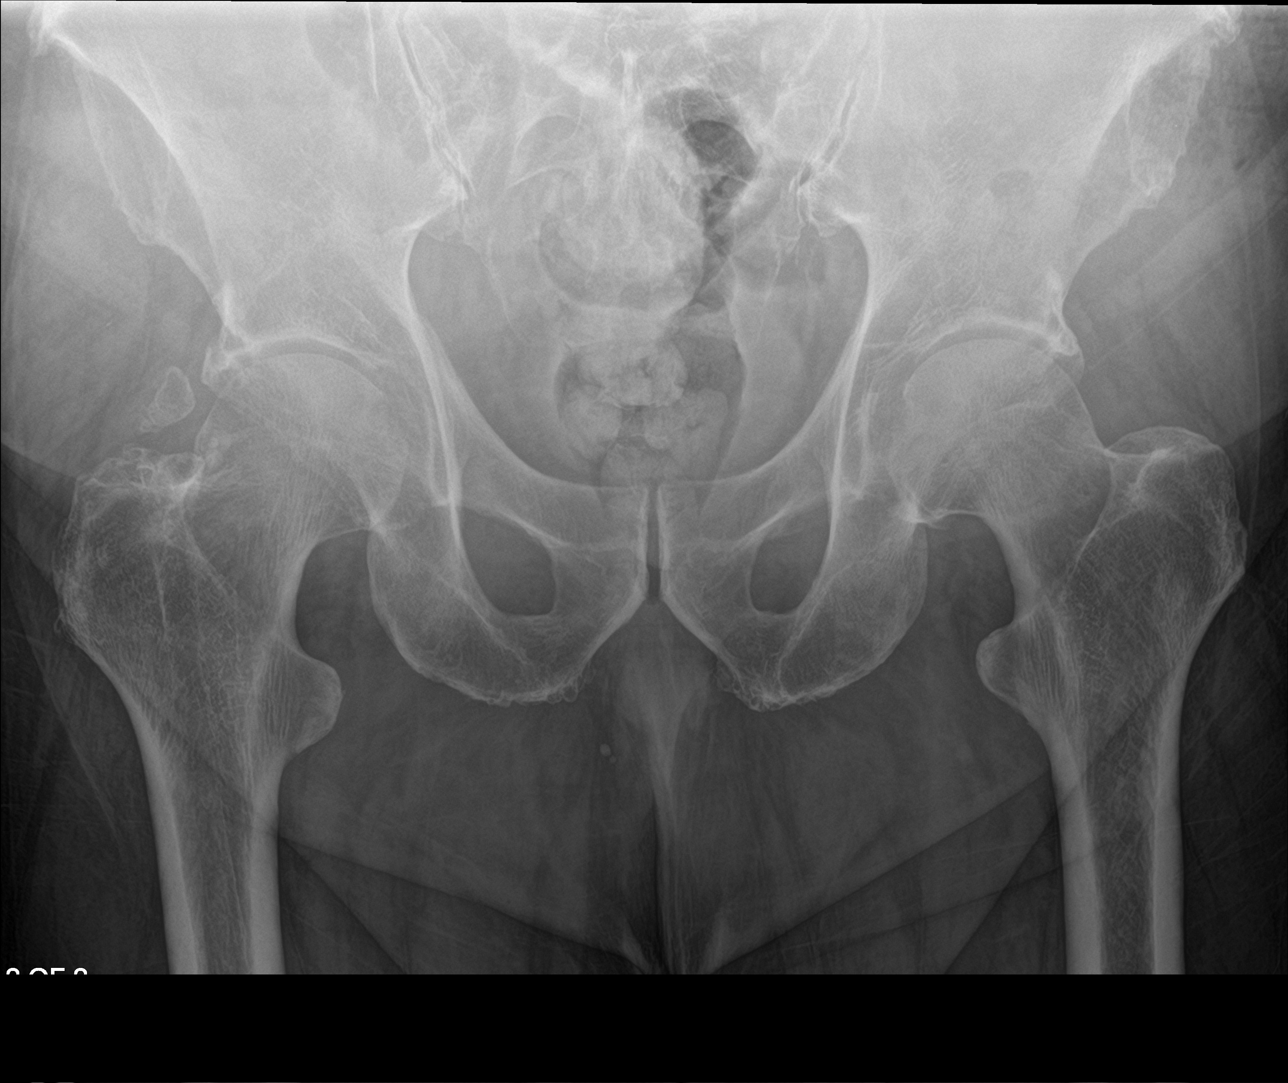
[im 2/2]
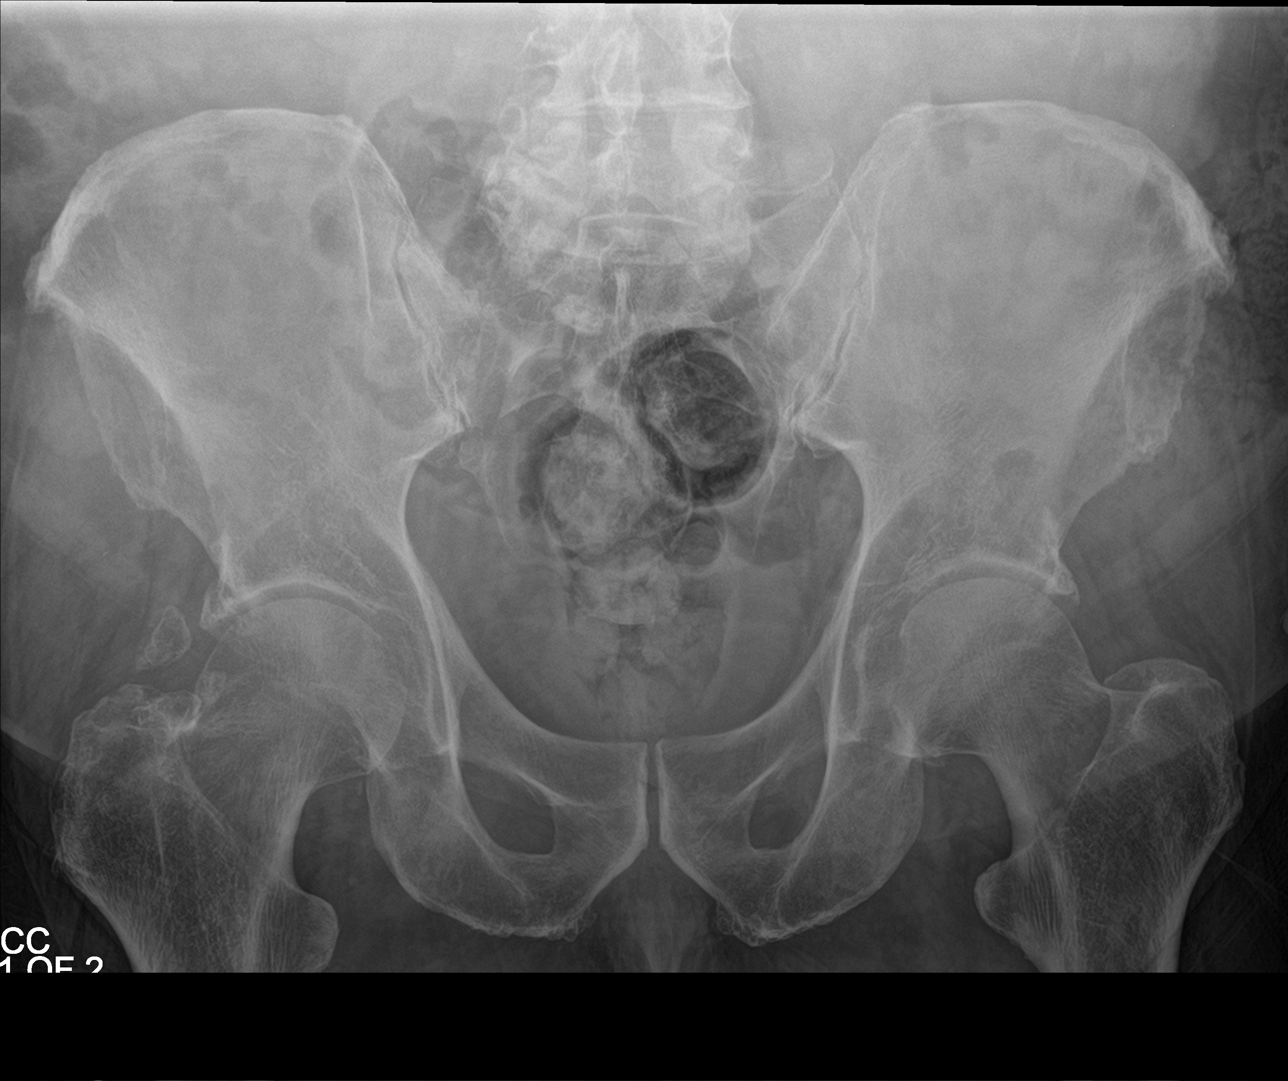

[hip ap]
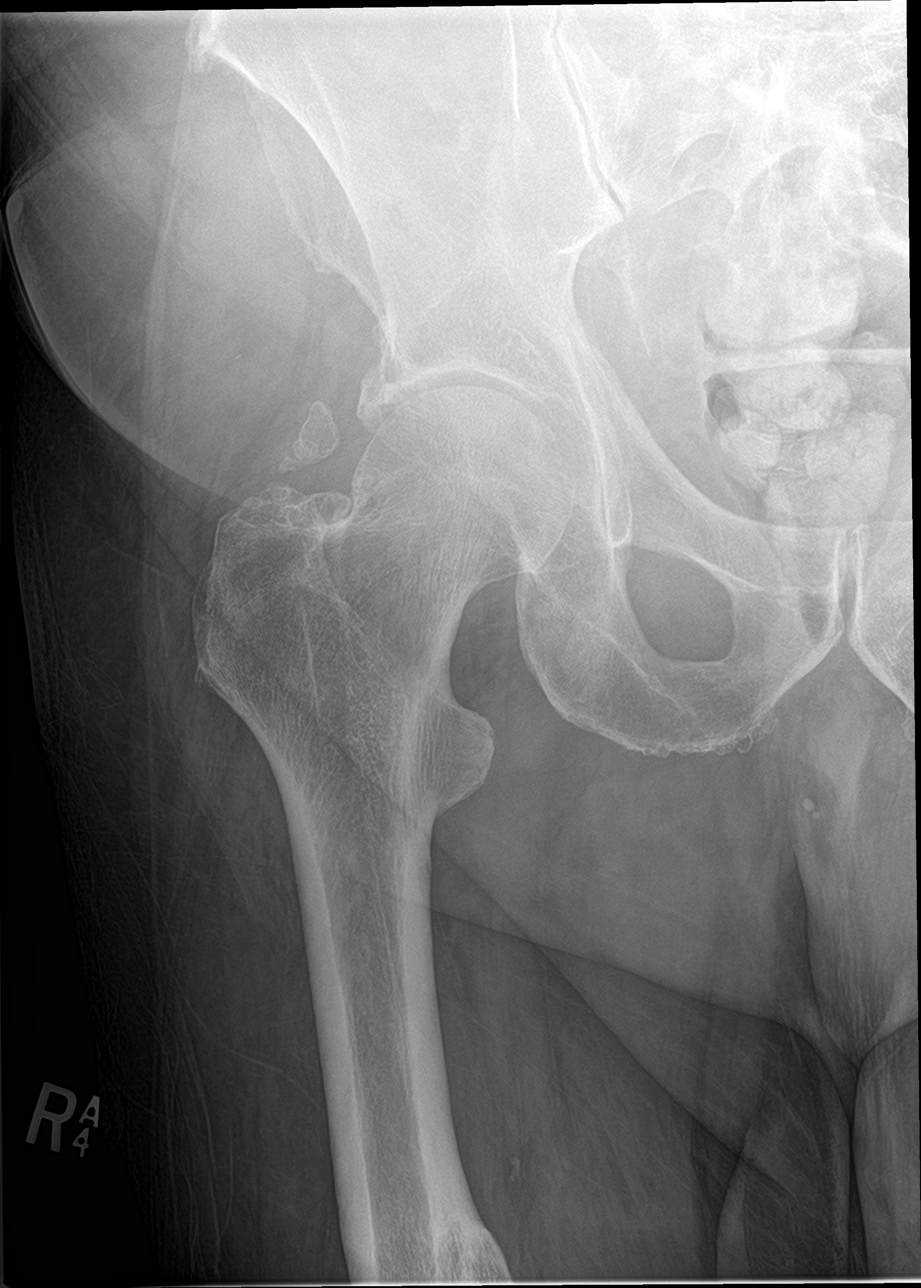

[hip lat]
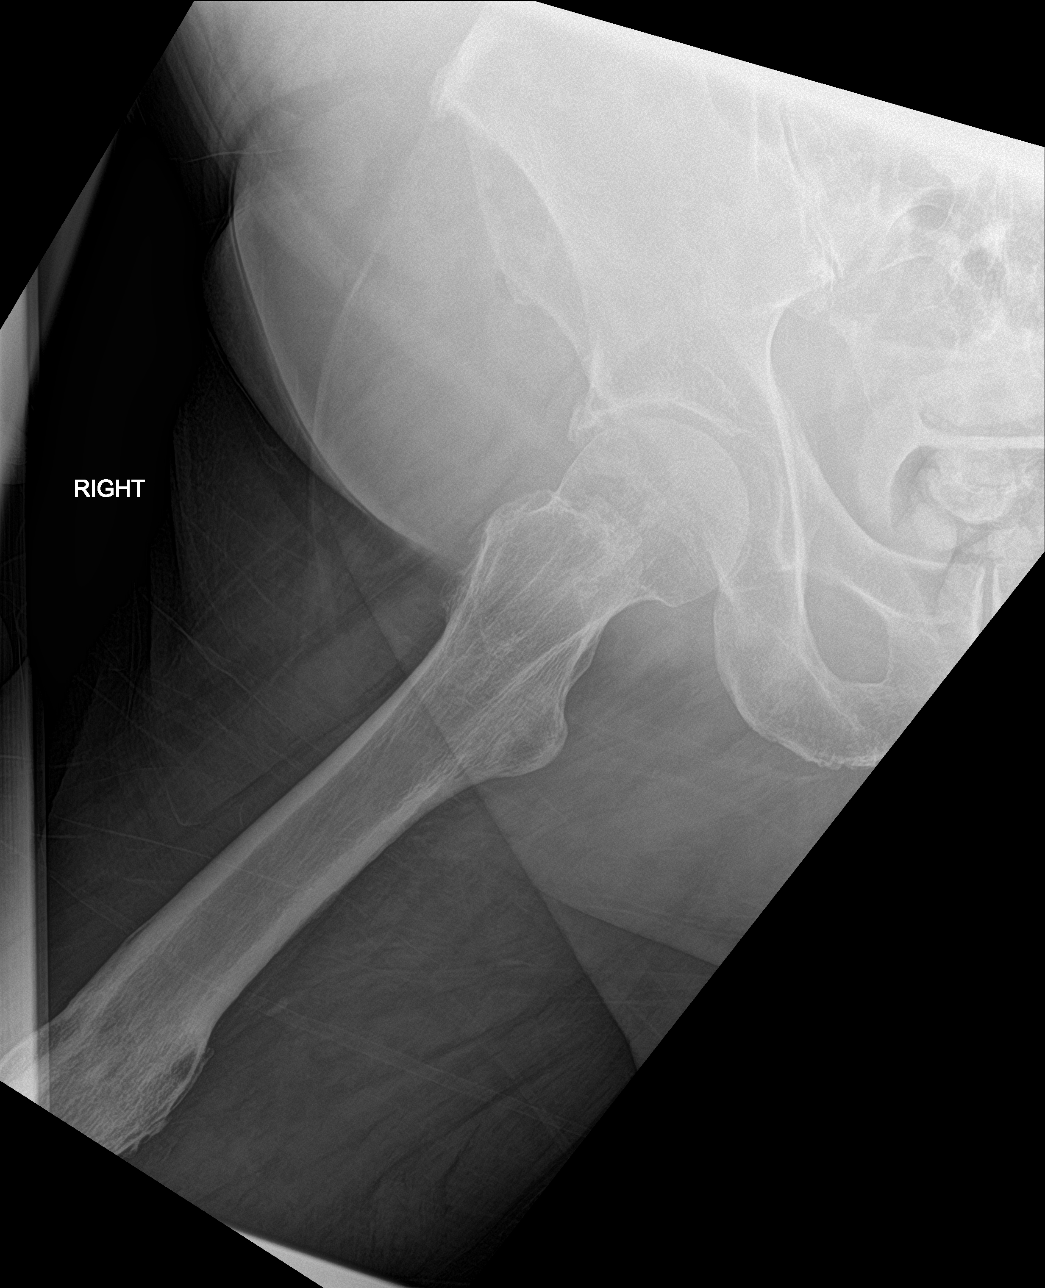

[4 of 4 positions shown; findings below may reference images not displayed]

FINDINGS: Degenerative changes seen in both hips. No fractures or
dislocations. Degenerative changes seen in the lower lumbar spine.
Contour abnormality to the distal most visualized portion of the
right femur, likely in the right femoral diaphysis. This could be
sequela of previous trauma or an underlying bony lesion. No other
acute abnormalities.
IMPRESSION: Contour abnormality to the mid right femoral diaphysis could
represent sequela of previous trauma or an underlying bony lesion.
This is incompletely evaluated. Recommend clinical correlation and
dedicated imaging as clinically warranted.

Mild degenerative changes in the right hip.

## 2018-06-27 IMAGING — CR DG LUMBAR SPINE COMPLETE W/ BEND
7 series · 7 of 7 positions shown · non-contrast
Comparison: MRI 06/02/2016

CLINICAL DATA: Failed back surgery.

EXAM:
LUMBAR SPINE - COMPLETE WITH BENDING VIEWS

[l-spine ap]
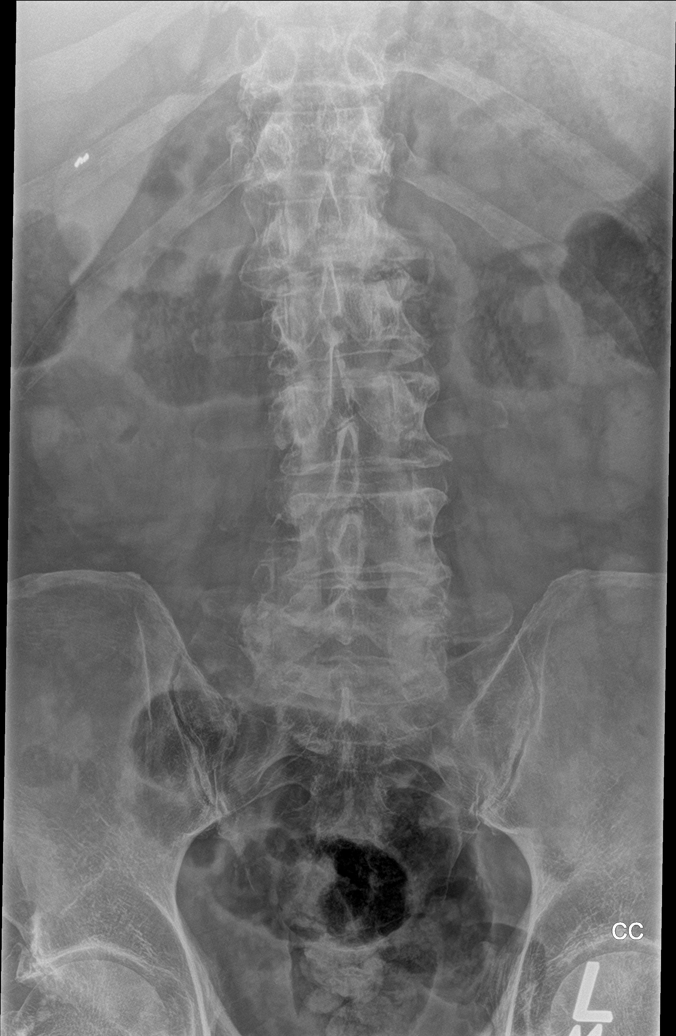

[l-spine obl (1 of 2)]
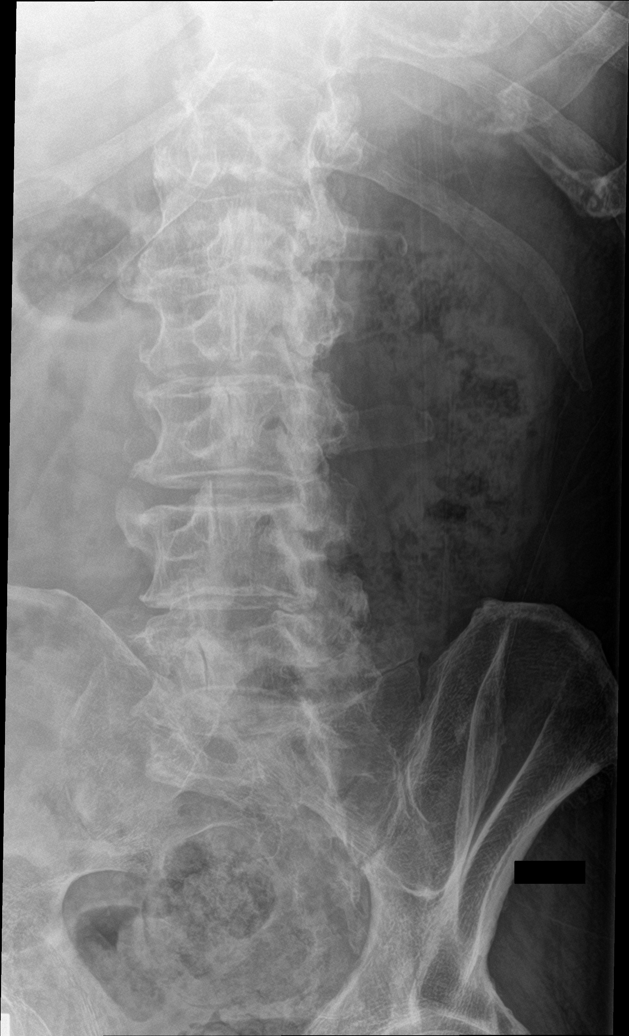

[l-spine obl (2 of 2)]
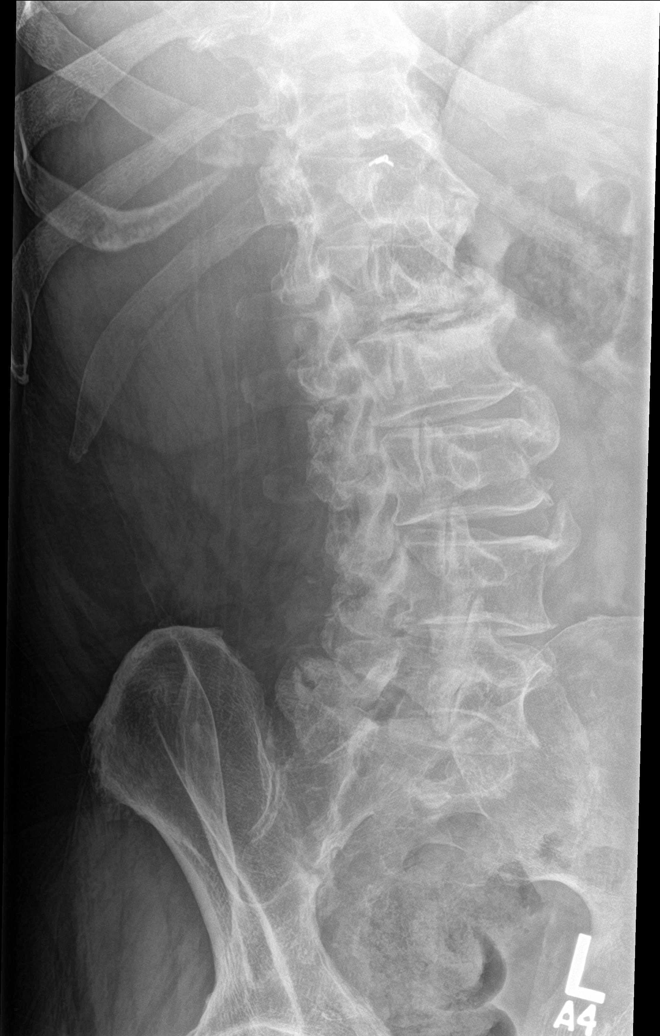

[l-spine lat]
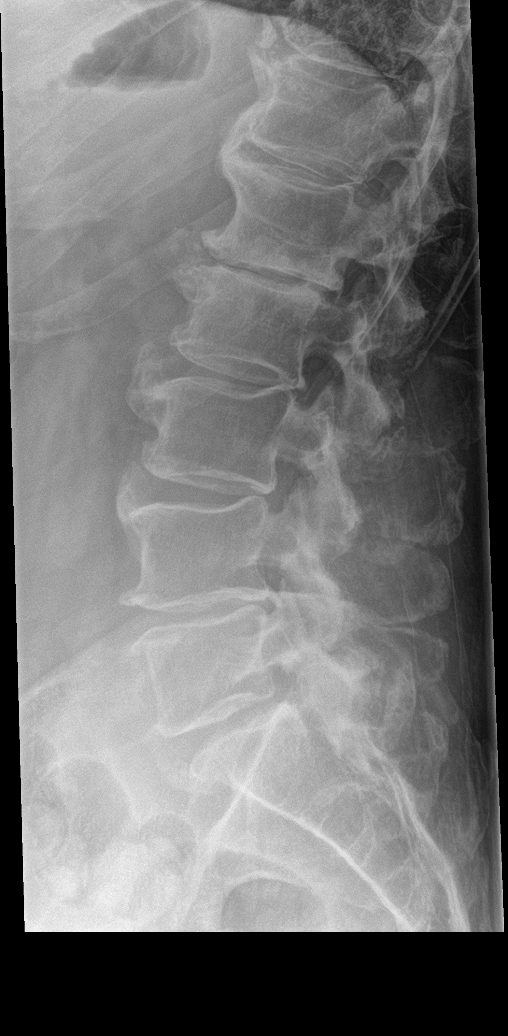

[l-spine flex]
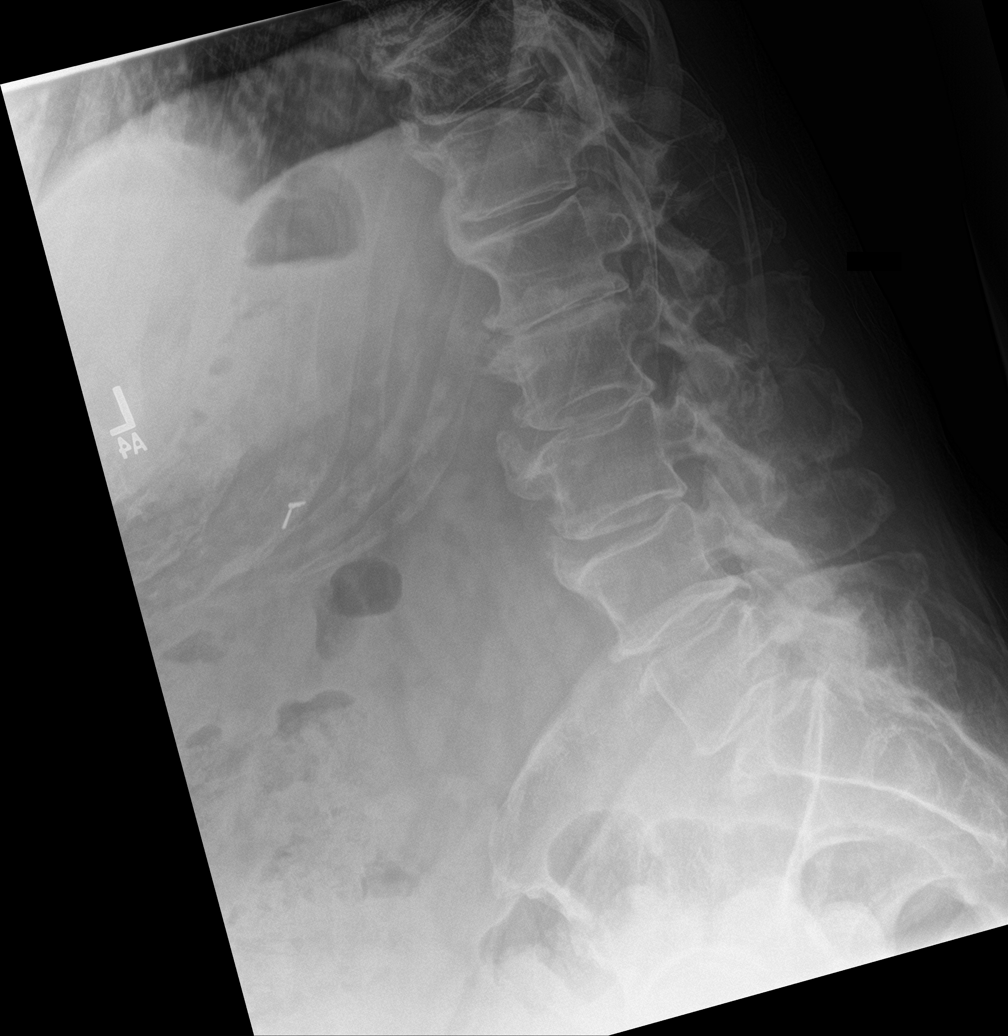

[l-spine ext]
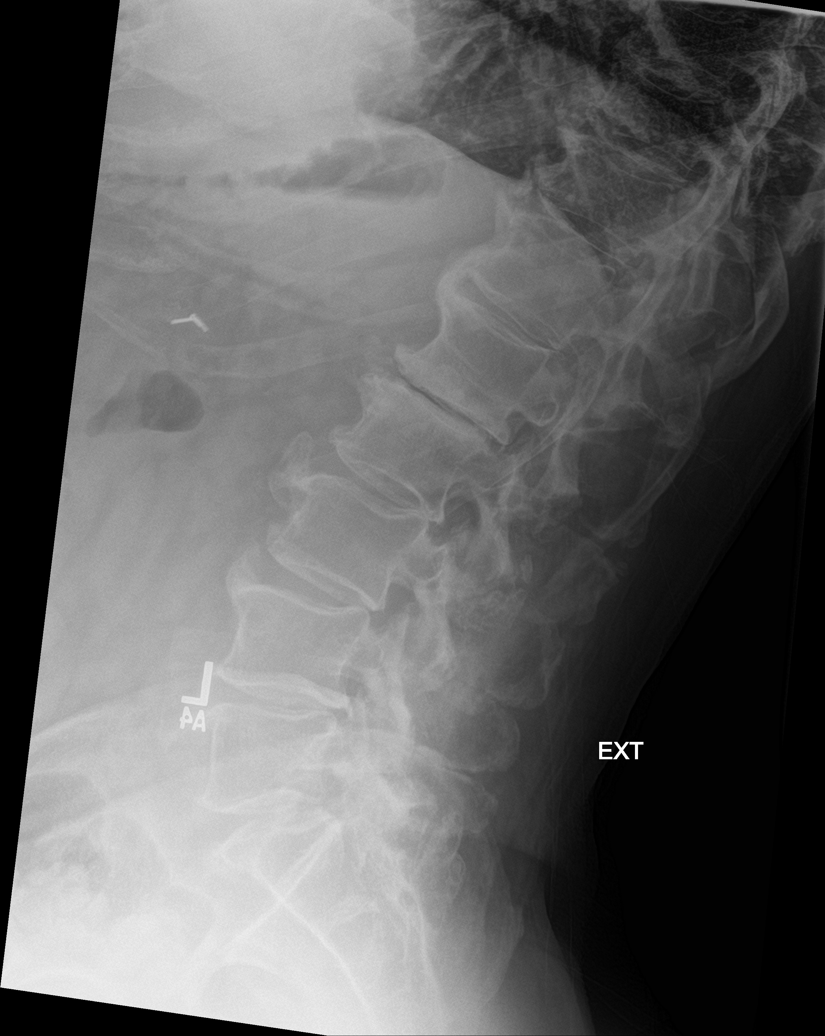

[l-spine spot]
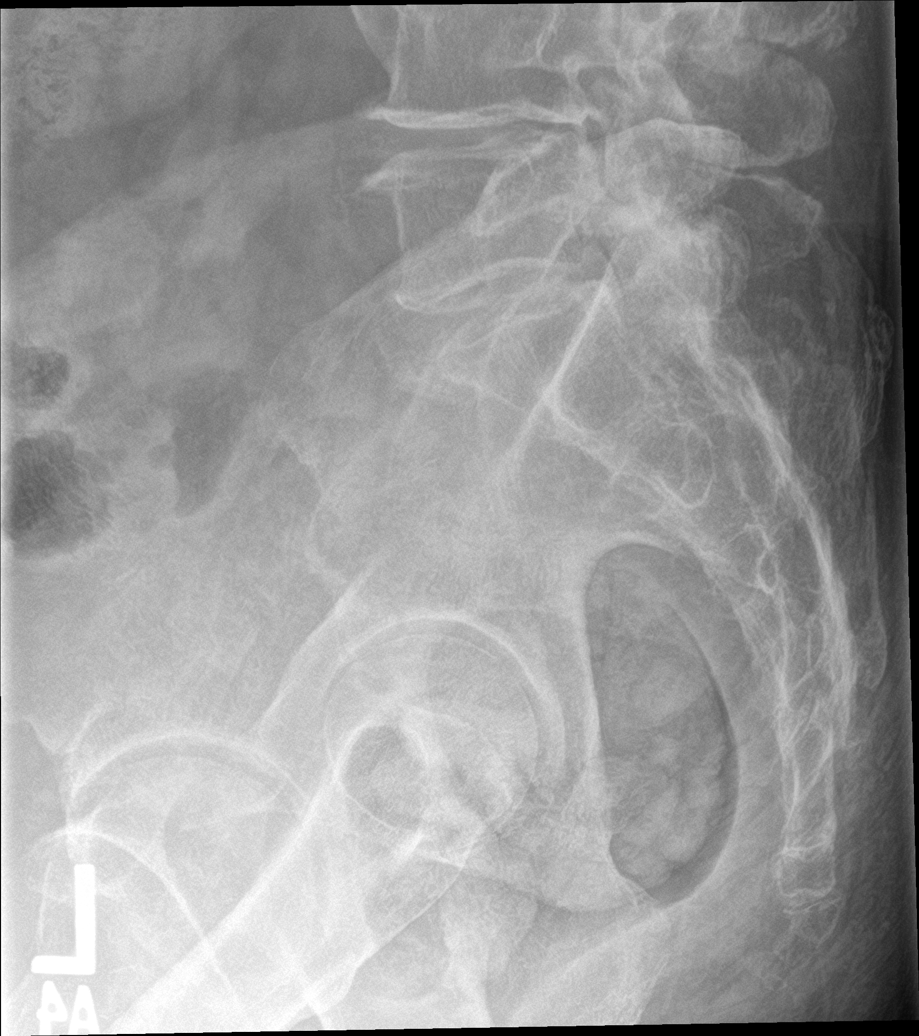

[7 of 7 positions shown; findings below may reference images not displayed]

FINDINGS: Previously seen bony changes at the L1-2 disc space not appreciated
by plain film. Sclerosis is noted in the endplates around the L1-2
disc space. Disc space narrowing at this level with spurring. No
instability with flexion or extension. Diffuse degenerative disc
disease and facet disease throughout the lumbar spine.
IMPRESSION: Degenerative disc disease changes at L1-2 and elsewhere throughout
the lumbar spine. No instability with flexion or extension.

## 2018-07-01 ENCOUNTER — Telehealth: Payer: Self-pay

## 2018-07-01 NOTE — Telephone Encounter (Signed)
DOXIMITY VISIT CONSENT OBTAINED VERBALLY.  YOUR CARDIOLOGY TEAM HAS ARRANGED FOR AN E-VISIT FOR YOUR APPOINTMENT - PLEASE REVIEW IMPORTANT INFORMATION BELOW SEVERAL DAYS PRIOR TO YOUR APPOINTMENT  Due to the recent COVID-19 pandemic, we are transitioning in-person office visits to tele-medicine visits in an effort to decrease unnecessary exposure to our patients and staff. Medicare and most insurances are covering these visits without a copay needed. We also encourage you to sign up for MyChart if you have not already done so. You will need a smartphone if possible. For patients that do not have this, we can still complete the visit using a regular telephone but do prefer a smartphone to enable video when possible. You may have a close family member that lives with you that can help. If possible, we also ask that you have a blood pressure cuff and scale at home to measure your blood pressure, heart rate and weight prior to your scheduled appointment. Patients with clinical needs that need an in-person evaluation and testing will still be able to come to the office if absolutely necessary. If you have any questions, feel free to call our office.    IF YOU HAVE A SMARTPHONE, PLEASE DOWNLOAD THE WEBEX APP TO YOUR SMARTPHONE  - If Apple, go to [employer] and type in WebEx in the search bar. [place] [employer], the blue/green circle. The app is free but as with any other app download, your phone may require you to verify saved payment information or Apple password. You do NOT have to create a WebEx account.  - If Android, go to [employer] and type in [employer] in the search bar. [place] [employer], the blue/green circle. The app is free but as with any other app download, your phone may require you to verify saved payment information or Android password. You do NOT have to create a WebEx account.  It is very helpful to have this downloaded before your visit.    2-3 DAYS BEFORE  YOUR APPOINTMENT  You will receive a telephone call from one of our [place] team members - your caller ID may say "Unknown caller." If this is a video visit, we will confirm that you have been able to download the WebEx app. We will remind you check your blood pressure, heart rate and weight prior to your scheduled appointment. If you have an Apple Watch or Kardia, please upload any pertinent ECG strips the day before or morning of your appointment to [place]. Our staff will also make sure you have reviewed the consent and agree to move forward with your scheduled tele-health visit.     THE DAY OF YOUR APPOINTMENT  Approximately 15 minutes prior to your scheduled appointment, you will receive a telephone call from one of [facility] team - your caller ID may say "Unknown caller."  Our staff will confirm medications, vital signs for the day and any symptoms you may be experiencing. Please have this information available prior to the time of visit start. It may also be helpful for you to have a pad of paper and pen handy for any instructions given during your visit. They will also walk you through joining the WebEx smartphone meeting if this is a video visit.    CONSENT FOR TELE-HEALTH VISIT - PLEASE REVIEW  I hereby voluntarily request, consent and authorize [facility] and its employed or contracted physicians, physician assistants, nurse practitioners or other licensed health care professionals (the Practitioner), to provide me with telemedicine  health care services (the "Services") as deemed necessary by the treating Practitioner. I acknowledge and consent to receive the Services by the Practitioner via telemedicine. I understand that the telemedicine visit will involve communicating with the Practitioner through live audiovisual communication technology and the disclosure of certain medical information by electronic transmission. I acknowledge that I have been given the opportunity to request an  in-person assessment or other available alternative prior to the telemedicine visit and am voluntarily participating in the telemedicine visit.  I understand that I have the right to withhold or withdraw my consent to the use of telemedicine in the course of my care at any time, without affecting my right to future care or treatment, and that the Practitioner or I may terminate the telemedicine visit at any time. I understand that I have the right to inspect all information obtained and/or recorded in the course of the telemedicine visit and may receive copies of available information for a reasonable fee.  I understand that some of the potential risks of receiving the Services via telemedicine include:  Marland Kitchen Delay or interruption in medical evaluation due to technological equipment failure or disruption; . Information transmitted may not be sufficient (e.g. poor resolution of images) to allow for appropriate medical decision making by the Practitioner; and/or  . In rare instances, security protocols could fail, causing a breach of personal health information.  Furthermore, I acknowledge that it is my responsibility to provide information about my medical history, conditions and care that is complete and accurate to the best of my ability. I acknowledge that Practitioner's advice, recommendations, and/or decision may be based on factors not within their control, such as incomplete or inaccurate data provided by me or distortions of diagnostic images or specimens that may result from electronic transmissions. I understand that the practice of medicine is not an exact science and that Practitioner makes no warranties or guarantees regarding treatment outcomes. I acknowledge that I will receive a copy of this consent concurrently upon execution via email to the email address I last provided but may also request a printed copy by calling the office of [facility].    I understand that my insurance will be  billed for this visit.   I have read or had this consent read to me. . I understand the contents of this consent, which adequately explains the benefits and risks of the Services being provided via telemedicine.  . I have been provided ample opportunity to ask questions regarding this consent and the Services and have had my questions answered to my satisfaction. . I give my informed consent for the services to be provided through the use of telemedicine in my medical care  By participating in this telemedicine visit I agree to the above. YES

## 2018-07-12 ENCOUNTER — Encounter: Payer: Self-pay | Admitting: Cardiovascular Disease

## 2018-07-12 ENCOUNTER — Telehealth (INDEPENDENT_AMBULATORY_CARE_PROVIDER_SITE_OTHER): Payer: Medicare Other | Admitting: Cardiovascular Disease

## 2018-07-12 ENCOUNTER — Other Ambulatory Visit: Payer: Self-pay

## 2018-07-12 VITALS — BP 104/65 | HR 95 | Ht 70.0 in | Wt 250.0 lb

## 2018-07-12 DIAGNOSIS — E7800 Pure hypercholesterolemia, unspecified: Secondary | ICD-10-CM

## 2018-07-12 DIAGNOSIS — I2510 Atherosclerotic heart disease of native coronary artery without angina pectoris: Secondary | ICD-10-CM

## 2018-07-12 DIAGNOSIS — I10 Essential (primary) hypertension: Secondary | ICD-10-CM

## 2018-07-12 NOTE — Patient Instructions (Signed)
Medication Instructions:  Stop taking hydrochlorothiazide. Continue other medications.  If you need a refill on your cardiac medications before your next appointment, please call your pharmacy.   Lab work: None If you have labs (blood work) drawn today and your tests are completely normal, you will receive your results only by: Marland Kitchen MyChart Message (if you have MyChart) OR . A paper copy in the mail If you have any lab test that is abnormal or we need to change your treatment, we will call you to review the results.  Testing/Procedures: None  Follow-Up: At [facility], you and your health needs are our priority.  As part of our continuing mission to provide you with exceptional heart care, we have created designated Provider Care Teams.  These Care Teams include your primary Cardiologist (physician) and Advanced Practice Providers (APPs -  Physician Assistants and Nurse Practitioners) who all work together to provide you with the care you need, when you need it. You will need a follow up appointment in 6 months.  Please call our office 2 months in advance to schedule this appointment.  You may see Kathlyn Sacramento, MD or one of the following Advanced Practice Providers on your designated Care Team:   Murray Hodgkins, NP Christell Faith, PA-C Marrianne Mood, PA-C
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
## 2018-07-13 DIAGNOSIS — E1165 Type 2 diabetes mellitus with hyperglycemia: Secondary | ICD-10-CM | POA: Insufficient documentation

## 2018-07-28 ENCOUNTER — Encounter: Payer: Self-pay | Admitting: *Deleted

## 2018-08-23 ENCOUNTER — Ambulatory Visit: Payer: Medicare Other | Attending: Nurse Practitioner | Admitting: Pain Medicine

## 2018-08-23 ENCOUNTER — Other Ambulatory Visit: Payer: Self-pay

## 2018-08-23 DIAGNOSIS — M79602 Pain in left arm: Secondary | ICD-10-CM

## 2018-08-23 DIAGNOSIS — G8929 Other chronic pain: Secondary | ICD-10-CM

## 2018-08-23 DIAGNOSIS — M1712 Unilateral primary osteoarthritis, left knee: Secondary | ICD-10-CM

## 2018-08-23 DIAGNOSIS — M542 Cervicalgia: Secondary | ICD-10-CM | POA: Diagnosis not present

## 2018-08-23 DIAGNOSIS — K219 Gastro-esophageal reflux disease without esophagitis: Secondary | ICD-10-CM

## 2018-08-23 DIAGNOSIS — M7918 Myalgia, other site: Secondary | ICD-10-CM

## 2018-08-23 DIAGNOSIS — M79601 Pain in right arm: Secondary | ICD-10-CM

## 2018-08-23 DIAGNOSIS — M545 Low back pain, unspecified: Secondary | ICD-10-CM

## 2018-08-23 DIAGNOSIS — M4820 Kissing spine, site unspecified: Secondary | ICD-10-CM

## 2018-08-23 DIAGNOSIS — G894 Chronic pain syndrome: Secondary | ICD-10-CM | POA: Diagnosis not present

## 2018-08-23 DIAGNOSIS — M5414 Radiculopathy, thoracic region: Secondary | ICD-10-CM

## 2018-08-23 MED ORDER — OXYCODONE HCL 5 MG PO TABS: 5.0000 mg | ORAL_TABLET | Freq: Four times a day (QID) | ORAL | 0 refills | Status: DC | PRN

## 2018-08-23 MED ORDER — MAGNESIUM OXIDE -MG SUPPLEMENT 500 MG PO CAPS: 1.0000 | ORAL_CAPSULE | Freq: Two times a day (BID) | ORAL | 0 refills | Status: DC

## 2018-08-23 NOTE — Patient Instructions (Signed)
[place]
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
## 2018-08-24 ENCOUNTER — Ambulatory Visit: Payer: Medicare Other | Admitting: Pain Medicine

## 2018-08-25 ENCOUNTER — Encounter: Payer: Medicare Other | Admitting: Nurse Practitioner

## 2018-10-27 DIAGNOSIS — L84 Corns and callosities: Secondary | ICD-10-CM | POA: Insufficient documentation

## 2018-11-20 ENCOUNTER — Other Ambulatory Visit: Payer: Self-pay | Admitting: Cardiovascular Disease

## 2018-11-23 ENCOUNTER — Encounter: Payer: Self-pay | Admitting: Pain Medicine

## 2018-11-23 ENCOUNTER — Ambulatory Visit: Payer: Medicare Other | Attending: Pain Medicine | Admitting: Pain Medicine

## 2018-11-23 ENCOUNTER — Other Ambulatory Visit: Payer: Self-pay

## 2018-11-23 DIAGNOSIS — G894 Chronic pain syndrome: Secondary | ICD-10-CM

## 2018-11-23 DIAGNOSIS — G8929 Other chronic pain: Secondary | ICD-10-CM

## 2018-11-23 DIAGNOSIS — K219 Gastro-esophageal reflux disease without esophagitis: Secondary | ICD-10-CM

## 2018-11-23 DIAGNOSIS — M79601 Pain in right arm: Secondary | ICD-10-CM

## 2018-11-23 DIAGNOSIS — M25559 Pain in unspecified hip: Secondary | ICD-10-CM | POA: Diagnosis not present

## 2018-11-23 DIAGNOSIS — M545 Low back pain, unspecified: Secondary | ICD-10-CM

## 2018-11-23 DIAGNOSIS — M542 Cervicalgia: Secondary | ICD-10-CM

## 2018-11-23 DIAGNOSIS — M79602 Pain in left arm: Secondary | ICD-10-CM

## 2018-11-23 DIAGNOSIS — M5414 Radiculopathy, thoracic region: Secondary | ICD-10-CM

## 2018-11-23 DIAGNOSIS — M7918 Myalgia, other site: Secondary | ICD-10-CM

## 2018-11-23 MED ORDER — MAGNESIUM OXIDE -MG SUPPLEMENT 500 MG PO CAPS: 1.0000 | ORAL_CAPSULE | Freq: Two times a day (BID) | ORAL | 3 refills | Status: DC

## 2018-11-23 MED ORDER — OXYCODONE HCL 5 MG PO TABS: 5.0000 mg | ORAL_TABLET | Freq: Four times a day (QID) | ORAL | 0 refills | Status: DC | PRN

## 2018-11-23 NOTE — Patient Instructions (Signed)
____________________________________________________________________________________________  Medication Rules  Purpose: To inform patients, and their family members, of our rules and regulations.  Applies to: All patients receiving prescriptions (written or electronic).  Pharmacy of record: Pharmacy where electronic prescriptions will be sent. If written prescriptions are taken to a different pharmacy, please inform the nursing staff. The pharmacy listed in the electronic medical record should be the one where you would like electronic prescriptions to be sent.  Electronic prescriptions: In compliance with the [place] Strengthen Opioid Misuse Prevention (STOP) Act of 2017 (Session Law 2017-74/H243), effective March 23, 2018, all controlled substances must be electronically prescribed. Calling prescriptions to the pharmacy will cease to exist.  Prescription refills: Only during scheduled appointments. Applies to all prescriptions.  NOTE: The following applies primarily to controlled substances (Opioid* Pain Medications).   Patient's responsibilities: 1. Pain Pills: Bring all pain pills to every appointment (except for procedure appointments). 2. Pill Bottles: Bring pills in original pharmacy bottle. Always bring the newest bottle. Bring bottle, even if empty. 3. Medication refills: You are responsible for knowing and keeping track of what medications you take and those you need refilled. The day before your appointment: write a list of all prescriptions that need to be refilled. The day of the appointment: give the list to the admitting nurse. Prescriptions will be written only during appointments. No prescriptions will be written on procedure days. If you forget a medication: it will not be "Called in", "Faxed", or "electronically sent". You will need to get another appointment to get these prescribed. No early refills. Do not call asking to have your prescription filled  early. 4. Prescription Accuracy: You are responsible for carefully inspecting your prescriptions before leaving our office. Have the discharge nurse carefully go over each prescription with you, before taking them home. Make sure that your name is accurately spelled, that your address is correct. Check the name and dose of your medication to make sure it is accurate. Check the number of pills, and the written instructions to make sure they are clear and accurate. Make sure that you are given enough medication to last until your next medication refill appointment. 5. Taking Medication: Take medication as prescribed. When it comes to controlled substances, taking less pills or less frequently than prescribed is permitted and encouraged. Never take more pills than instructed. Never take medication more frequently than prescribed.  6. Inform other Doctors: Always inform, all of your healthcare providers, of all the medications you take. 7. Pain Medication from other Providers: You are not allowed to accept any additional pain medication from any other Doctor or Healthcare provider. There are two exceptions to this rule. (see below) In the event that you require additional pain medication, you are responsible for notifying us, as stated below. 8. Medication Agreement: You are responsible for carefully reading and following our Medication Agreement. This must be signed before receiving any prescriptions from our practice. Safely store a copy of your signed Agreement. Violations to the Agreement will result in no further prescriptions. (Additional copies of our Medication Agreement are available upon request.) 9. Laws, Rules, & Regulations: All patients are expected to follow all Federal and State Laws, Statutes, Rules, & Regulations. Ignorance of the Laws does not constitute a valid excuse. The use of any illegal substances is prohibited. 10. Adopted CDC guidelines & recommendations: Target dosing levels will be  at or below 60 MME/day. Use of benzodiazepines** is not recommended.  Exceptions: There are only two exceptions to the rule of not   receiving pain medications from other Healthcare Providers. 1. Exception #1 (Emergencies): In the event of an emergency (i.e.: accident requiring emergency care), you are allowed to receive additional pain medication. However, you are responsible for: As soon as you are able, call our office [phone], at any time of the day or night, and leave a message stating your name, the date and nature of the emergency, and the name and dose of the medication prescribed. In the event that your call is answered by a member of our staff, make sure to document and save the date, time, and the name of the person that took your information.  2. Exception #2 (Planned Surgery): In the event that you are scheduled by another doctor or dentist to have any type of surgery or procedure, you are allowed (for a period no longer than 30 days), to receive additional pain medication, for the acute post-op pain. However, in this case, you are responsible for picking up a copy of our "Post-op Pain Management for Surgeons" handout, and giving it to your surgeon or dentist. This document is available at our office, and does not require an appointment to obtain it. Simply go to our office during business hours (Monday-Thursday from 8:00 AM to 4:00 PM) (Friday 8:00 AM to 12:00 Noon) or if you have a scheduled appointment with us, prior to your surgery, and ask for it by name. In addition, you will need to provide us with your name, name of your surgeon, type of surgery, and date of procedure or surgery.  *Opioid medications include: morphine, codeine, oxycodone, oxymorphone, hydrocodone, hydromorphone, meperidine, tramadol, tapentadol, buprenorphine, fentanyl, methadone. **Benzodiazepine medications include: diazepam (Valium), alprazolam (Xanax), clonazepam (Klonopine), lorazepam (Ativan), clorazepate  (Tranxene), chlordiazepoxide (Librium), estazolam (Prosom), oxazepam (Serax), temazepam (Restoril), triazolam (Halcion) (Last updated: 05/20/2017) ____________________________________________________________________________________________   ____________________________________________________________________________________________  Medication Recommendations and Reminders  Applies to: All patients receiving prescriptions (written and/or electronic).  Medication Rules & Regulations: These rules and regulations exist for your safety and that of others. They are not flexible and neither are we. Dismissing or ignoring them will be considered "non-compliance" with medication therapy, resulting in complete and irreversible termination of such therapy. (See document titled "Medication Rules" for more details.) In all conscience, because of safety reasons, we cannot continue providing a therapy where the patient does not follow instructions.  Pharmacy of record:   Definition: This is the pharmacy where your electronic prescriptions will be sent.   We do not endorse any particular pharmacy.  You are not restricted in your choice of pharmacy.  The pharmacy listed in the electronic medical record should be the one where you want electronic prescriptions to be sent.  If you choose to change pharmacy, simply notify our nursing staff of your choice of new pharmacy.  Recommendations:  Keep all of your pain medications in a safe place, under lock and key, even if you live alone.   After you fill your prescription, take 1 week's worth of pills and put them away in a safe place. You should keep a separate, properly labeled bottle for this purpose. The remainder should be kept in the original bottle. Use this as your primary supply, until it runs out. Once it's gone, then you know that you have 1 week's worth of medicine, and it is time to come in for a prescription refill. If you do this correctly, it  is unlikely that you will ever run out of medicine.  To make sure that the above recommendation works,   it is very important that you make sure your medication refill appointments are scheduled at least 1 week before you run out of medicine. To do this in an effective manner, make sure that you do not leave the office without scheduling your next medication management appointment. Always ask the nursing staff to show you in your prescription , when your medication will be running out. Then arrange for the receptionist to get you a return appointment, at least 7 days before you run out of medicine. Do not wait until you have 1 or 2 pills left, to come in. This is very poor planning and does not take into consideration that we may need to cancel appointments due to bad weather, sickness, or emergencies affecting our staff.  "Partial Fill": If for any reason your pharmacy does not have enough pills/tablets to completely fill or refill your prescription, do not allow for a "partial fill". You will need a separate prescription to fill the remaining amount, which we will not provide. If the reason for the partial fill is your insurance, you will need to talk to the pharmacist about payment alternatives for the remaining tablets, but again, do not accept a partial fill.  Prescription refills and/or changes in medication(s):   Prescription refills, and/or changes in dose or medication, will be conducted only during scheduled medication management appointments. (Applies to both, written and electronic prescriptions.)  No refills on procedure days. No medication will be changed or started on procedure days. No changes, adjustments, and/or refills will be conducted on a procedure day. Doing so will interfere with the diagnostic portion of the procedure.  No phone refills. No medications will be "called into the pharmacy".  No Fax refills.  No weekend refills.  No Holliday refills.  No after hours  refills.  Remember:  Business hours are:  Monday to Thursday 8:00 AM to 4:00 PM Provider's Schedule: Jaevion Goto, MD - Appointments are:  Medication management: Monday and Wednesday 8:00 AM to 4:00 PM Procedure day: Tuesday and Thursday 7:30 AM to 4:00 PM Bilal Lateef, MD - Appointments are:  Medication management: Tuesday and Thursday 8:00 AM to 4:00 PM Procedure day: Monday and Wednesday 7:30 AM to 4:00 PM (Last update: 05/20/2017) ____________________________________________________________________________________________    
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
## 2018-12-13 ENCOUNTER — Other Ambulatory Visit: Payer: Self-pay

## 2018-12-13 ENCOUNTER — Ambulatory Visit: Payer: Medicare Other | Admitting: Cardiovascular Disease

## 2018-12-13 ENCOUNTER — Encounter: Payer: Self-pay | Admitting: Cardiovascular Disease

## 2018-12-13 VITALS — BP 110/60 | HR 62 | Ht 70.0 in | Wt 254.0 lb

## 2018-12-13 DIAGNOSIS — I739 Peripheral vascular disease, unspecified: Secondary | ICD-10-CM | POA: Diagnosis not present

## 2018-12-13 DIAGNOSIS — I471 Supraventricular tachycardia: Secondary | ICD-10-CM | POA: Diagnosis not present

## 2018-12-13 DIAGNOSIS — I2510 Atherosclerotic heart disease of native coronary artery without angina pectoris: Secondary | ICD-10-CM | POA: Diagnosis not present

## 2018-12-13 DIAGNOSIS — I10 Essential (primary) hypertension: Secondary | ICD-10-CM
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
## 2018-12-13 NOTE — Patient Instructions (Signed)
Medication Instructions:  Your physician recommends that you continue on your current medications as directed. Please refer to the Current Medication list given to you today.  If you need a refill on your cardiac medications before your next appointment, please call your pharmacy.   Lab work: None ordered If you have labs (blood work) drawn today and your tests are completely normal, you will receive your results only by: Marland Kitchen MyChart Message (if you have MyChart) OR . A paper copy in the mail If you have any lab test that is abnormal or we need to change your treatment, we will call you to review the results.  Testing/Procedures: None ordered  Follow-Up: At [facility], you and your health needs are our priority.  As part of our continuing mission to provide you with exceptional heart care, we have created designated Provider Care Teams.  These Care Teams include your primary Cardiologist (physician) and Advanced Practice Providers (APPs -  Physician Assistants and Nurse Practitioners) who all work together to provide you with the care you need, when you need it. You will need a follow up appointment in 12 months.  Please call our office 2 months in advance to schedule this appointment.  You may see Kathlyn Sacramento, MD or one of the following Advanced Practice Providers on your designated Care Team:   Murray Hodgkins, NP Christell Faith, PA-C . Marrianne Mood, PA-C  Any Other Special Instructions Will Be Listed Below (If Applicable). N/A

## 2018-12-15 ENCOUNTER — Other Ambulatory Visit: Payer: Self-pay

## 2018-12-15 ENCOUNTER — Ambulatory Visit: Payer: Medicare Other | Admitting: Urology

## 2018-12-15 DIAGNOSIS — R3915 Urgency of urination: Secondary | ICD-10-CM | POA: Diagnosis not present

## 2018-12-15 DIAGNOSIS — N3281 Overactive bladder: Secondary | ICD-10-CM | POA: Diagnosis not present

## 2018-12-15 LAB — URINALYSIS, COMPLETE
Bilirubin, UA: NEGATIVE
Glucose, UA: NEGATIVE
Ketones, UA: NEGATIVE
Leukocytes,UA: NEGATIVE
Nitrite, UA: NEGATIVE
Protein,UA: NEGATIVE
RBC, UA: NEGATIVE
Specific Gravity, UA: 1.025 (ref 1.005–1.030)
Urobilinogen, Ur: 0.2 mg/dL (ref 0.2–1.0)
pH, UA: 6 (ref 5.0–7.5)

## 2018-12-15 LAB — MICROSCOPIC EXAMINATION
Bacteria, UA: NONE SEEN
Epithelial Cells (non renal): NONE SEEN /hpf (ref 0–10)
RBC, Urine: NONE SEEN /hpf (ref 0–2)

## 2018-12-15 LAB — BLADDER SCAN AMB NON-IMAGING
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
## 2018-12-15 NOTE — Patient Instructions (Addendum)
1. Minimize tea in the diet 2. Minimize fluids after 7pm, urinate twice before bed  Overactive Bladder, Adult  Overactive bladder refers to a condition in which a person has a sudden need to pass urine. The person may leak urine if he or she cannot get to the bathroom fast enough (urinary incontinence). A person with this condition may also wake up several times in the night to go to the bathroom. Overactive bladder is associated with poor nerve signals between your bladder and your brain. Your bladder may get the signal to empty before it is full. You may also have very sensitive muscles that make your bladder squeeze too soon. These symptoms might interfere with daily work or social activities. What are the causes? This condition may be associated with or caused by:  Urinary tract infection.  Infection of nearby tissues, such as the prostate.  Prostate enlargement.  Surgery on the uterus or urethra.  Bladder stones, inflammation, or tumors.  Drinking too much caffeine or alcohol.  Certain medicines, especially medicines that get rid of extra fluid in the body (diuretics).  Muscle or nerve weakness, especially from: ? A spinal cord injury. ? Stroke. ? Multiple sclerosis. ? Parkinson's disease.  Diabetes.  Constipation. What increases the risk? You may be at greater risk for overactive bladder if you:  Are an older adult.  Smoke.  Are going through menopause.  Have prostate problems.  Have a neurological disease, such as stroke, dementia, Parkinson's disease, or multiple sclerosis (MS).  Eat or drink things that irritate the bladder. These include alcohol, spicy food, and caffeine.  Are overweight or obese. What are the signs or symptoms? Symptoms of this condition include:  Sudden, strong urge to urinate.  Leaking urine.  Urinating 8 or more times a day.  Waking up to urinate 2 or more times a night. How is this diagnosed? Your health care provider may  suspect overactive bladder based on your symptoms. He or she will diagnose this condition by:  A physical exam and medical history.  Blood or urine tests. You might need bladder or urine tests to help determine what is causing your overactive bladder. You might also need to see a health care provider who specializes in urinary tract problems (urologist). How is this treated? Treatment for overactive bladder depends on the cause of your condition and whether it is mild or severe. You can also make lifestyle changes at home. Options include:  Bladder training. This may include: ? Learning to control the urge to urinate by following a schedule that directs you to urinate at regular intervals (timed voiding). ? Doing Kegel exercises to strengthen your pelvic floor muscles, which support your bladder. Toning these muscles can help you control urination, even if your bladder muscles are overactive.  Special devices. This may include: ? Biofeedback, which uses sensors to help you become aware of your body's signals. ? Electrical stimulation, which uses electrodes placed inside the body (implanted) or outside the body. These electrodes send gentle pulses of electricity to strengthen the nerves or muscles that control the bladder. ? Women may use a plastic device that fits into the vagina and supports the bladder (pessary).  Medicines. ? Antibiotics to treat bladder infection. ? Antispasmodics to stop the bladder from releasing urine at the wrong time. ? Tricyclic antidepressants to relax bladder muscles. ? Injections of botulinum toxin type A directly into the bladder tissue to relax bladder muscles.  Lifestyle changes. This may include: ? Weight loss. Talk  to your health care provider about weight loss methods that would work best for you. ? Diet changes. This may include reducing how much alcohol and caffeine you consume, or drinking fluids at different times of the day. ? Not smoking. Do not  use any products that contain nicotine or tobacco, such as cigarettes and e-cigarettes. If you need help quitting, ask your health care provider.  Surgery. ? A device may be implanted to help manage the nerve signals that control urination. ? An electrode may be implanted to stimulate electrical signals in the bladder. ? A procedure may be done to change the shape of the bladder. This is done only in very severe cases. Follow these instructions at home: Lifestyle  Make any diet or lifestyle changes that are recommended by your health care provider. These may include: ? Drinking less fluid or drinking fluids at different times of the day. ? Cutting down on caffeine or alcohol. ? Doing Kegel exercises. ? Losing weight if needed. ? Eating a healthy and balanced diet to prevent constipation. This may include:  Eating foods that are high in fiber, such as fresh fruits and vegetables, whole grains, and beans.  Limiting foods that are high in fat and processed sugars, such as fried and sweet foods. General instructions  Take over-the-counter and prescription medicines only as told by your health care provider.  If you were prescribed an antibiotic medicine, take it as told by your health care provider. Do not stop taking the antibiotic even if you start to feel better.  Use any implants or pessary as told by your health care provider.  If needed, wear pads to absorb urine leakage.  Keep a journal or log to track how much and when you drink and when you feel the need to urinate. This will help your health care provider monitor your condition.  Keep all follow-up visits as told by your health care provider. This is important. Contact a health care provider if:  You have a fever.  Your symptoms do not get better with treatment.  Your pain and discomfort get worse.  You have more frequent urges to urinate. Get help right away if:  You are not able to control your bladder. Summary   Overactive bladder refers to a condition in which a person has a sudden need to pass urine.  Several conditions may lead to an overactive bladder.  Treatment for overactive bladder depends on the cause and severity of your condition.  Follow your health care provider's instructions about lifestyle changes, doing Kegel exercises, keeping a journal, and taking medicines. This information is not intended to replace advice given to you by your health care provider. Make sure you discuss any questions you have with your health care provider. Document Released: 01/03/2009 Document Revised: 06/30/2018 Document Reviewed: 03/25/2017 Elsevier Patient Education  2020 [employer].

## 2019-01-21 ENCOUNTER — Other Ambulatory Visit: Payer: Self-pay | Admitting: Cardiovascular Disease

## 2019-01-23 ENCOUNTER — Other Ambulatory Visit: Payer: Self-pay

## 2019-01-23 ENCOUNTER — Ambulatory Visit: Payer: Medicare Other | Admitting: Urology

## 2019-01-23 ENCOUNTER — Encounter: Payer: Self-pay | Admitting: Urology

## 2019-01-23 VITALS — BP 141/84 | HR 84 | Ht 70.0 in | Wt 250.0 lb

## 2019-01-23 DIAGNOSIS — R3915 Urgency of urination: Secondary | ICD-10-CM

## 2019-01-23 DIAGNOSIS — N3281 Overactive bladder: Secondary | ICD-10-CM | POA: Diagnosis not present

## 2019-01-23 MED ORDER — MIRABEGRON ER 50 MG PO TB24: 50.0000 mg | ORAL_TABLET | Freq: Every day | ORAL | 3 refills | Status: DC

## 2019-01-23 NOTE — Progress Notes (Signed)
   01/23/2019 4:32 PM   Howard Maxwell 02/04/1946 [id number]  Reason for visit: Follow up OAB  HPI: I saw Howard Maxwell back in urology clinic regarding his overactive bladder symptoms of urinary urgency, frequency, and urge incontinence.  He is a 73 year old male with chronic back pain and multiple back surgeries who has a 5-year history of severe urinary urgency, frequency and urge incontinence.  He was voiding every 30 minutes during the day, and every 1-2 hours overnight, as well as having episodes of urge incontinence on car trips.  He had a negative hematuria work-up in 2005, and has an extensive smoking history of 80+ pack years.  He also has poorly controlled diabetes, and sleep apnea not on CPAP.  We tried Myrbetriq 50 mg daily, and discussed some behavioral strategies including minimizing soda, coffee, tea in the diet.  He notes remarkable improvement in his symptoms since starting the Myrbetriq 50 mg daily, and he is very satisfied with his urinary symptoms at this time.  Myrbetriq 50 mg refilled RTC 1 year symptom check   A total of 15 minutes were spent face-to-face with the patient, greater than 50% was spent in patient education, counseling, and coordination of care regarding OAB.  [place], [facility] [address], [place], [place] [address] [phone]

## 2019-01-23 NOTE — Patient Instructions (Signed)
Mirabegron extended-release tablets What is this medicine? MIRABEGRON (MIR a BEG ron) is used to treat overactive bladder. This medicine reduces the amount of bathroom visits. It may also help to control wetting accidents. It may be used alone, but sometimes may be given with other treatments. This medicine may be used for other purposes; ask your health care provider or pharmacist if you have questions. COMMON BRAND NAME(S): Myrbetriq What should I tell my health care provider before I take this medicine? They need to know if you have any of these conditions:  high blood pressure  kidney disease  liver disease  problems urinating  prostate disease  an unusual or allergic reaction to mirabegron, other medicines, foods, dyes, or preservatives  pregnant or trying to get pregnant  breast-feeding How should I use this medicine? Take this medicine by mouth with a glass of water. Follow the directions on the prescription label. Do not cut, crush or chew this medicine. You can take it with or without food. If it upsets your stomach, take it with food. Take your medicine at regular intervals. Do not take it more often than directed. Do not stop taking except on your doctor's advice. Talk to your pediatrician regarding the use of this medicine in children. Special care may be needed. Overdosage: If you think you have taken too much of this medicine contact a poison control center or emergency room at once. NOTE: This medicine is only for you. Do not share this medicine with others. What if I miss a dose? If you miss a dose, take it as soon as you can. If it is almost time for your next dose, take only that dose. Do not take double or extra doses. What may interact with this medicine?  codeine  desipramine  digoxin  flecainide  MAOIs like Carbex, Eldepryl, Marplan, Nardil, and Parnate  methadone  metoprolol  pimozide  propafenone  thioridazine  warfarin This list may not  describe all possible interactions. Give your health care provider a list of all the medicines, herbs, non-prescription drugs, or dietary supplements you use. Also tell them if you smoke, drink alcohol, or use illegal drugs. Some items may interact with your medicine. What should I watch for while using this medicine? Visit your doctor or health care professional for regular checks on your progress. Check your blood pressure as directed. Ask your doctor or health care professional what your blood pressure should be and when you should contact him or her. You may need to limit your intake of tea, coffee, caffeinated sodas, or alcohol. These drinks may make your symptoms worse. What side effects may I notice from receiving this medicine? Side effects that you should report to your doctor or health care professional as soon as possible:  allergic reactions like skin rash, itching or hives, swelling of the face, lips, or tongue  high blood pressure  fast, irregular heartbeat  redness, blistering, peeling or loosening of the skin, including inside the mouth  signs of infection like fever or chills; pain or difficulty passing urine  trouble passing urine or change in the amount of urine Side effects that usually do not require medical attention (report to your doctor or health care professional if they continue or are bothersome):  constipation  dry mouth  headache  runny nose  stomach upset This list may not describe all possible side effects. Call your doctor for medical advice about side effects. You may report side effects to FDA at 1-800-FDA-1088. Where should  I keep my medicine? Keep out of the reach of children. Store at room temperature between 15 and 30 degrees C (59 and 86 degrees F). Throw away any unused medicine after the expiration date. NOTE: This sheet is a summary. It may not cover all possible information. If you have questions about this medicine, talk to your doctor,  pharmacist, or health care provider.  2020 Elsevier/Gold Standard (2016-07-30 11:33:21)

## 2019-01-24 LAB — URINALYSIS, COMPLETE
Bilirubin, UA: NEGATIVE
Ketones, UA: NEGATIVE
Leukocytes,UA: NEGATIVE
Nitrite, UA: NEGATIVE
Protein,UA: NEGATIVE
RBC, UA: NEGATIVE
Specific Gravity, UA: 1.02 (ref 1.005–1.030)
Urobilinogen, Ur: 0.2 mg/dL (ref 0.2–1.0)
pH, UA: 7.5 (ref 5.0–7.5)

## 2019-01-24 LAB — MICROSCOPIC EXAMINATION
Bacteria, UA: NONE SEEN
Epithelial Cells (non renal): NONE SEEN /hpf (ref 0–10)
WBC, UA: NONE SEEN /hpf (ref 0–5)

## 2019-02-28 ENCOUNTER — Telehealth: Payer: Self-pay | Admitting: *Deleted
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
## 2019-02-28 NOTE — Telephone Encounter (Signed)
Called patient for pre appointment review of allergies/medications. Spoke with wife, states patient is resting and she asked me to call back later.

## 2019-03-01 ENCOUNTER — Ambulatory Visit: Payer: Medicare Other | Attending: Pain Medicine | Admitting: Pain Medicine

## 2019-03-01 ENCOUNTER — Other Ambulatory Visit: Payer: Self-pay

## 2019-03-01 DIAGNOSIS — M25559 Pain in unspecified hip: Secondary | ICD-10-CM | POA: Diagnosis not present

## 2019-03-01 DIAGNOSIS — M79602 Pain in left arm: Secondary | ICD-10-CM

## 2019-03-01 DIAGNOSIS — M545 Low back pain, unspecified: Secondary | ICD-10-CM

## 2019-03-01 DIAGNOSIS — G894 Chronic pain syndrome: Secondary | ICD-10-CM | POA: Diagnosis not present

## 2019-03-01 DIAGNOSIS — M79601 Pain in right arm: Secondary | ICD-10-CM

## 2019-03-01 DIAGNOSIS — M542 Cervicalgia: Secondary | ICD-10-CM

## 2019-03-01 DIAGNOSIS — M5414 Radiculopathy, thoracic region: Secondary | ICD-10-CM

## 2019-03-01 DIAGNOSIS — G8929 Other chronic pain: Secondary | ICD-10-CM

## 2019-03-01 MED ORDER — OXYCODONE HCL 5 MG PO TABS: 5.0000 mg | ORAL_TABLET | Freq: Four times a day (QID) | ORAL | 0 refills | Status: DC | PRN

## 2019-04-18 IMAGING — RF DG C-ARM 61-120 MIN
1 series · 4 of 4 positions shown · non-contrast
Comparison: 04/08/2017 radiographs

CLINICAL DATA: C4-5 and C5-6 disc replacement.

EXAM:
DG C-ARM 61-120 MIN; CERVICAL SPINE - 2-3 VIEW

[Series 1: run · 4 of 4 slices shown]
[im 1/4]
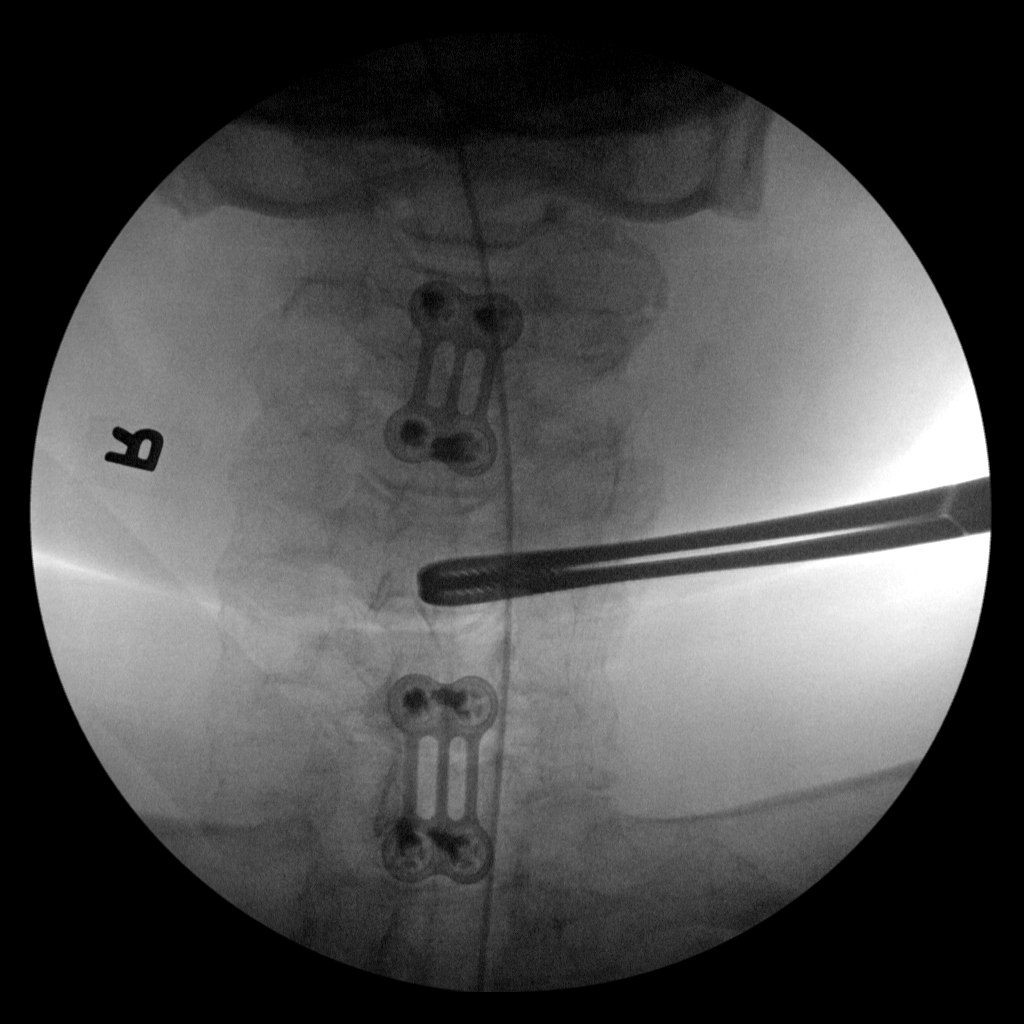
[im 2/4]
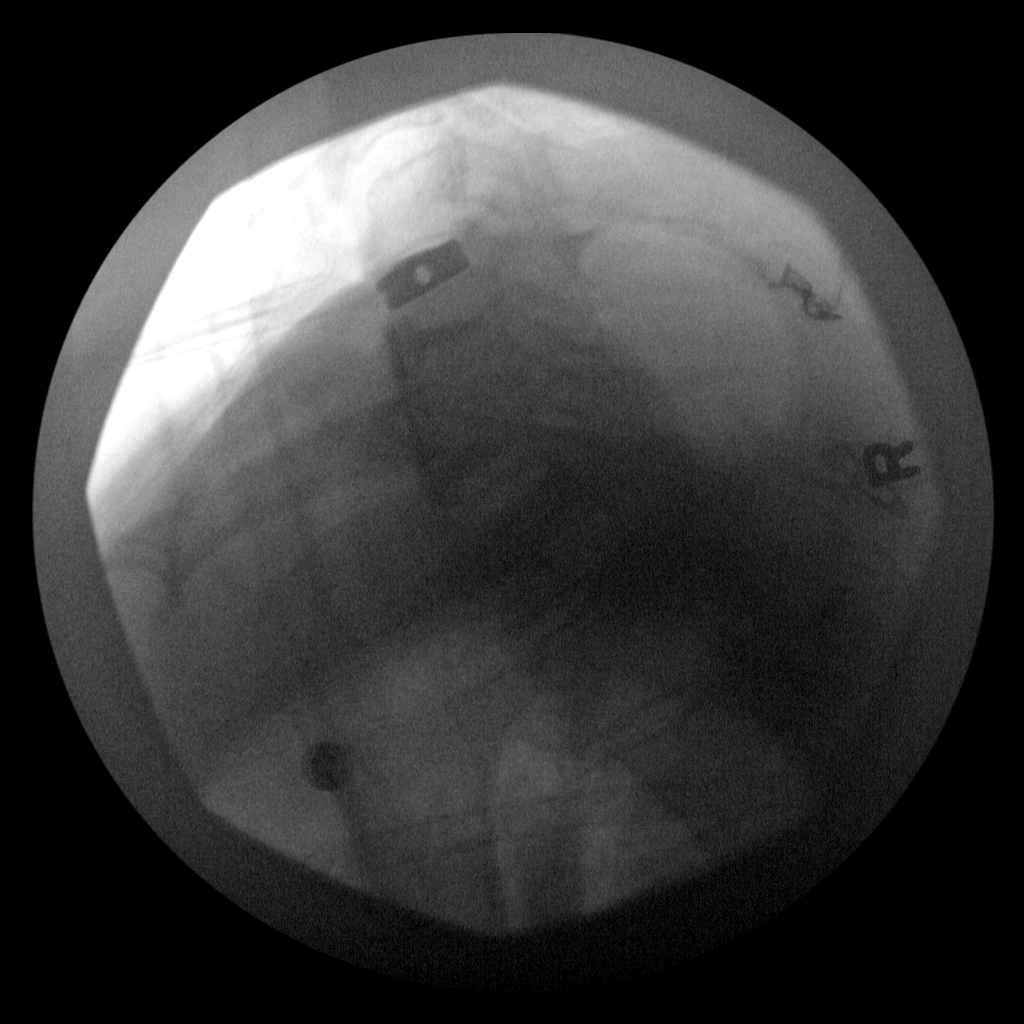
[im 3/4]
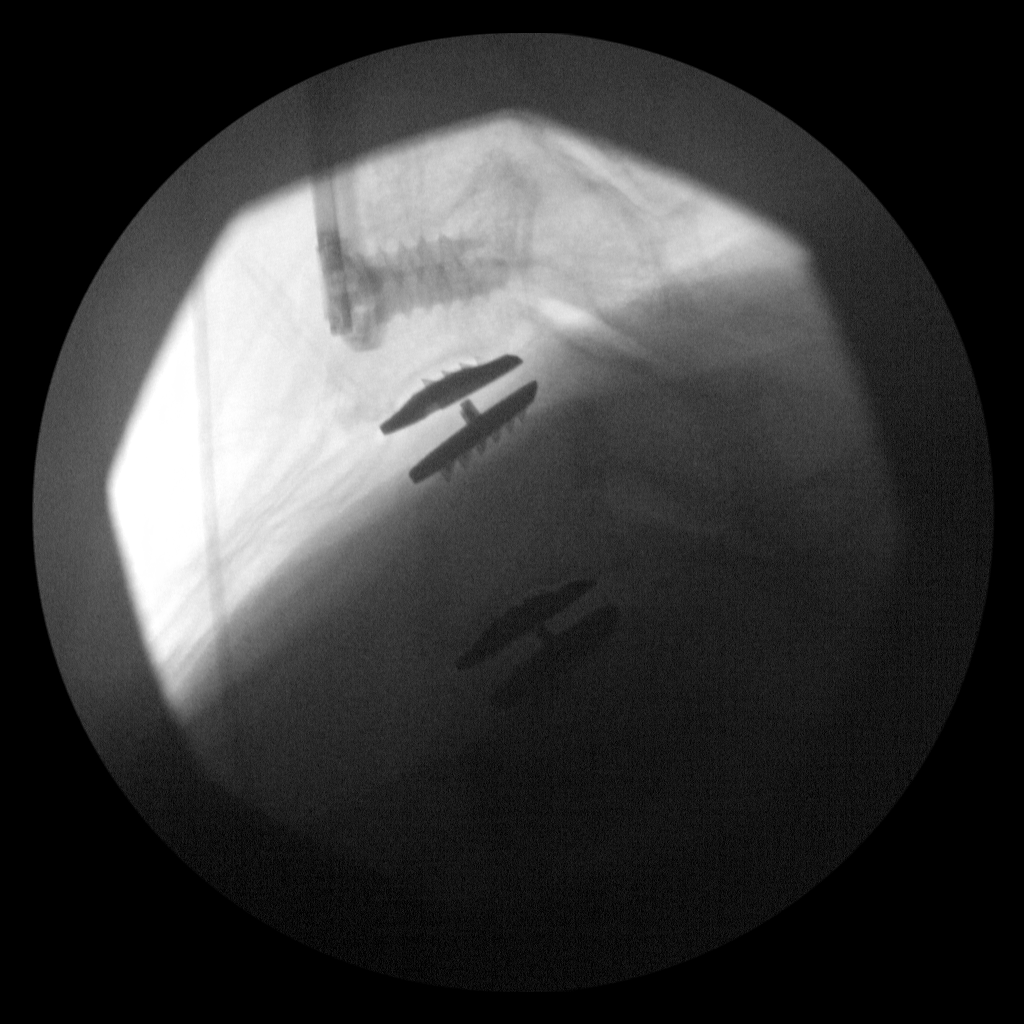
[im 4/4]
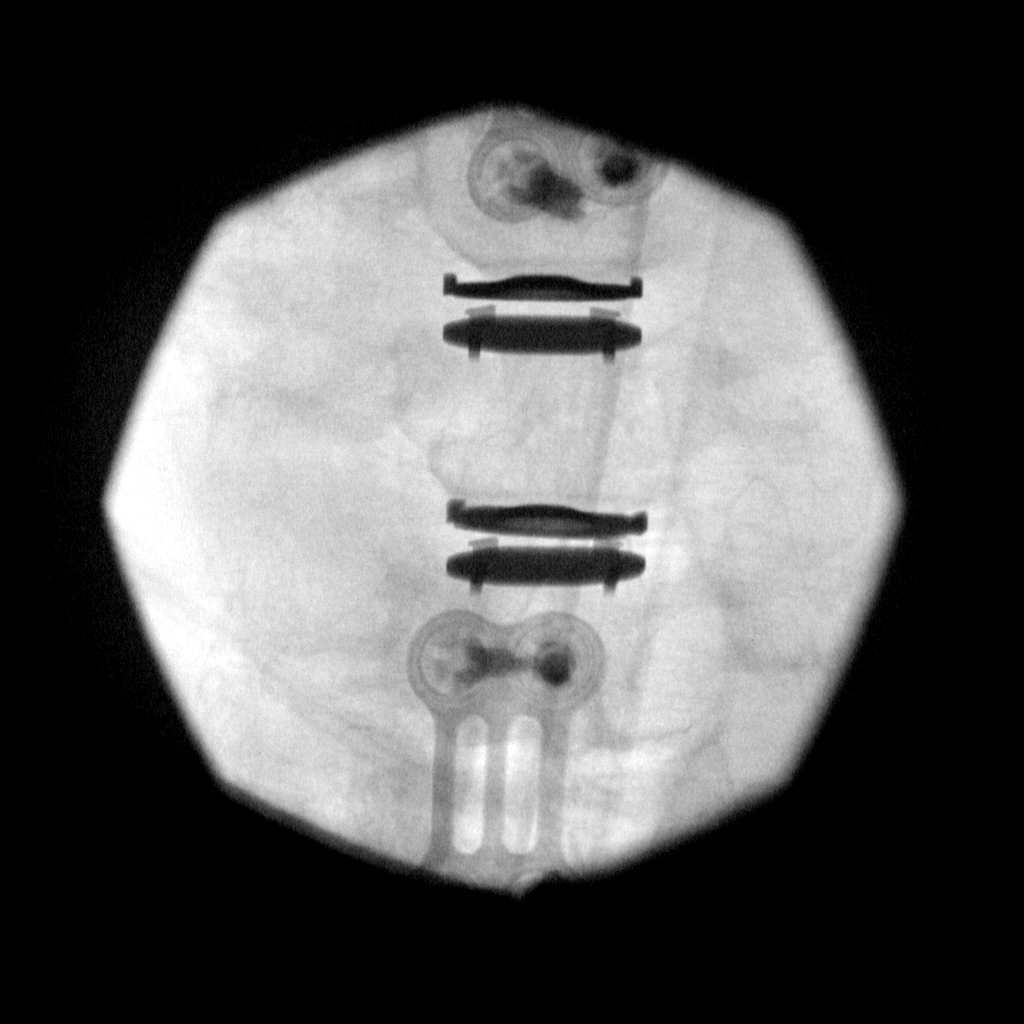

[4 of 4 positions shown; findings below may reference images not displayed]

FINDINGS: Four intraoperative views of the cervical spine are submitted
postoperatively for interpretation.

Intervertebral disc replacements are identified at C4-5 and C5-6.

No gross complicating features noted.
IMPRESSION: Disc replacements at C4-5 and C5-6.

## 2019-05-07 ENCOUNTER — Other Ambulatory Visit: Payer: Self-pay | Admitting: Cardiovascular Disease

## 2019-05-08 ENCOUNTER — Other Ambulatory Visit: Payer: Self-pay

## 2019-05-08 DIAGNOSIS — R3915 Urgency of urination: Secondary | ICD-10-CM

## 2019-05-08 DIAGNOSIS — N3281 Overactive bladder: Secondary | ICD-10-CM

## 2019-05-08 MED ORDER — MIRABEGRON ER 50 MG PO TB24: 50.0000 mg | ORAL_TABLET | Freq: Every day | ORAL | 2 refills | Status: DC

## 2019-05-08 NOTE — Telephone Encounter (Signed)
Pt needs RX changed to mail order pharmacy, RX sent

## 2019-05-29 ENCOUNTER — Encounter: Payer: Self-pay | Admitting: Pain Medicine

## 2019-05-29 NOTE — Progress Notes (Deleted)
Nursing Pain Medication Assessment:  Safety precautions to be maintained throughout the outpatient stay will include: orient to surroundings, keep bed in low position, maintain call bell within reach at all times, provide assistance with transfer out of bed and ambulation.  Medication Inspection Compliance: {Blank single:19197::"Mr. Pennisi did not comply with our request to bring his pills to be counted. He was reminded that bringing the medication bottles, even when empty, is a requirement.","Pill count conducted under aseptic conditions, in front of the patient. Neither the pills nor the bottle was removed from the patient's sight at any time. Once count was completed pills were immediately returned to the patient in their original bottle."}  Medication: {Blank single:19197::"Multiple medications","Buprenorphine (Suboxone)","Butorphanol (Stadol)","Duragesic patch","Fentanyl patch","Hydrocodone/APAP","Hydromorphone (Dilaudid)","Methadone","Morphine IR","Morphine ER (MSContin)","Oxycodone IR","Oxycodone/APAP","Oxycodone ER (OxyContin)","Oxymorphone (Opana)","Tapentadol (Nucynta)","Tramadol (Ultram)","See above"} Pill/Patch Count: {Blank single:19197::"No pills available to be counted.","*** of *** patches remain","*** of *** pills remain"} Pill/Patch Appearance: {Blank single:19197::"No markings","Markings inconsistent with prescribed medication","Markings consistent with prescribed medication"} Bottle Appearance: {Blank single:19197::"No label. Patient informed that medications must be transported in properly and accurately labeled containers.","Non-pharmacy container. Patient reminded that prescription medications must be kept in original, labeled, pharmacy bottle.","Old prescription bottle. Patient reminded that medications should always be kept in the newest prescription bottle.","No container available. Did not bring bottle(s) to appointment.","Standard pharmacy container. Clearly labeled."} Filled  Date: *** / *** / {Blank single:19197::"2020","2019","2018"} Last Medication intake:  {Blank single:19197::"Ran out of medicine more than 48 hours ago","Day before yesterday","Yesterday","Today"}

## 2019-05-29 NOTE — Patient Instructions (Signed)
Patient stated that he tested positive for Covid 19, 02/2019.
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
## 2019-05-31 ENCOUNTER — Ambulatory Visit: Payer: Medicare Other | Attending: Pain Medicine | Admitting: Pain Medicine

## 2019-05-31 ENCOUNTER — Other Ambulatory Visit: Payer: Self-pay

## 2019-05-31 DIAGNOSIS — M545 Low back pain, unspecified: Secondary | ICD-10-CM

## 2019-05-31 DIAGNOSIS — M25559 Pain in unspecified hip: Secondary | ICD-10-CM

## 2019-05-31 DIAGNOSIS — G894 Chronic pain syndrome: Secondary | ICD-10-CM

## 2019-05-31 DIAGNOSIS — M542 Cervicalgia: Secondary | ICD-10-CM

## 2019-05-31 DIAGNOSIS — M79602 Pain in left arm: Secondary | ICD-10-CM

## 2019-05-31 DIAGNOSIS — M5414 Radiculopathy, thoracic region: Secondary | ICD-10-CM

## 2019-05-31 DIAGNOSIS — M79601 Pain in right arm: Secondary | ICD-10-CM

## 2019-05-31 DIAGNOSIS — G8929 Other chronic pain: Secondary | ICD-10-CM

## 2019-05-31 MED ORDER — OXYCODONE HCL 5 MG PO TABS: 5.0000 mg | ORAL_TABLET | Freq: Four times a day (QID) | ORAL | 0 refills | Status: DC | PRN

## 2019-06-01 ENCOUNTER — Telehealth: Payer: Self-pay | Admitting: Cardiovascular Disease

## 2019-06-01 NOTE — Telephone Encounter (Signed)
Returned the patient's call. Patient sts that he was previously checking his BP regularly but had not done so in a couple of weeks.  He had his first COVID vaccine yesterday. Patient sts that he ha d an episode of dizziness when  getting off of the couch and ambulating to his bedroom. Checked his BP and reported the readings below. 3/11 @ 2:15pm 224/133, 2:30pm 2:30pm 210/127. He is taking all of his medications as prescribed. Patient denies any stroke like symptoms. He denies pre-syncope or syncope.  I asked the patient to recheck his BP while I held the line 129/58 73bpm. The patient sts that this reading is closer to what he normal get swhen monitoring.  Advised the patient to monitor his BP daily over the next 7 days. Recommended that he sit and relax for 15 min prior to checking. Advised the patient to contact the office to report his BP readings if the are consistently elevated >140/90.  Patient is agreeable with the plan and voiced appreciation for the call back.

## 2019-06-01 NOTE — Telephone Encounter (Signed)
Pt c/o BP issue: STAT if pt c/o blurred vision, one-sided weakness or slurred speech  1. What are your last 5 BP readings?  03/11 - both taken within 15 minutes of each other  224/133 210/127   2. Are you having any other symptoms (ex. Dizziness, headache, blurred vision, passed out)? Some dizziness and a little blurred vision   3. What is your BP issue? Patient calling, states that he got first COVID shot yesterday afternoon.  Today he has had high BP and not feeling well.  Please call to discuss.
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
## 2019-08-30 ENCOUNTER — Ambulatory Visit: Payer: Medicare Other | Attending: Pain Medicine | Admitting: Pain Medicine

## 2019-08-30 ENCOUNTER — Telehealth: Payer: Self-pay | Admitting: Pain Medicine

## 2019-08-30 ENCOUNTER — Other Ambulatory Visit: Payer: Self-pay

## 2019-08-30 DIAGNOSIS — M25559 Pain in unspecified hip: Secondary | ICD-10-CM

## 2019-08-30 DIAGNOSIS — G8929 Other chronic pain: Secondary | ICD-10-CM

## 2019-08-30 DIAGNOSIS — M5414 Radiculopathy, thoracic region: Secondary | ICD-10-CM

## 2019-08-30 DIAGNOSIS — M25551 Pain in right hip: Secondary | ICD-10-CM

## 2019-08-30 DIAGNOSIS — M545 Low back pain, unspecified: Secondary | ICD-10-CM

## 2019-08-30 DIAGNOSIS — M542 Cervicalgia: Secondary | ICD-10-CM

## 2019-08-30 DIAGNOSIS — G894 Chronic pain syndrome: Secondary | ICD-10-CM

## 2019-08-30 DIAGNOSIS — M79602 Pain in left arm: Secondary | ICD-10-CM

## 2019-08-30 DIAGNOSIS — M79601 Pain in right arm: Secondary | ICD-10-CM

## 2019-08-30 DIAGNOSIS — Z79899 Other long term (current) drug therapy: Secondary | ICD-10-CM

## 2019-08-30 DIAGNOSIS — M160 Bilateral primary osteoarthritis of hip: Secondary | ICD-10-CM

## 2019-08-30 MED ORDER — OXYCODONE HCL 5 MG PO TABS: 5.0000 mg | ORAL_TABLET | Freq: Four times a day (QID) | ORAL | 0 refills | Status: DC | PRN

## 2019-08-30 NOTE — Patient Instructions (Signed)
____________________________________________________________________________________________  Preparing for your procedure (without sedation)  Procedure appointments are limited to planned procedures: . No Prescription Refills. . No disability issues will be discussed. . No medication changes will be discussed.  Instructions: . Oral Intake: Do not eat or drink anything for at least 6 hours prior to your procedure. (Exception: Blood Pressure Medication. See below.) . Transportation: Unless otherwise stated by your physician, you may drive yourself after the procedure. . Blood Pressure Medicine: Do not forget to take your blood pressure medicine with a sip of water the morning of the procedure. If your Diastolic (lower reading)is above 100 mmHg, elective cases will be cancelled/rescheduled. . Blood thinners: These will need to be stopped for procedures. Notify our staff if you are taking any blood thinners. Depending on which one you take, there will be specific instructions on how and when to stop it. . Diabetics on insulin: Notify the staff so that you can be scheduled 1st case in the morning. If your diabetes requires high dose insulin, take only  of your normal insulin dose the morning of the procedure and notify the staff that you have done so. . Preventing infections: Shower with an antibacterial soap the morning of your procedure.  . Build-up your immune system: Take 1000 mg of Vitamin C with every meal (3 times a day) the day prior to your procedure. . Antibiotics: Inform the staff if you have a condition or reason that requires you to take antibiotics before dental procedures. . Pregnancy: If you are pregnant, call and cancel the procedure. . Sickness: If you have a cold, fever, or any active infections, call and cancel the procedure. . Arrival: You must be in the facility at least 30 minutes prior to your scheduled procedure. . Children: Do not bring any children with you. . Dress  appropriately: Bring dark clothing that you would not mind if they get stained. . Valuables: Do not bring any jewelry or valuables.  Reasons to call and reschedule or cancel your procedure: (Following these recommendations will minimize the risk of a serious complication.) . Surgeries: Avoid having procedures within 2 weeks of any surgery. (Avoid for 2 weeks before or after any surgery). . Flu Shots: Avoid having procedures within 2 weeks of a flu shots or . (Avoid for 2 weeks before or after immunizations). . Barium: Avoid having a procedure within 7-10 days after having had a radiological study involving the use of radiological contrast. (Myelograms, Barium swallow or enema study). . Heart attacks: Avoid any elective procedures or surgeries for the initial 6 months after a "Myocardial Infarction" (Heart Attack). . Blood thinners: It is imperative that you stop these medications before procedures. Let us know if you if you take any blood thinner.  . Infection: Avoid procedures during or within two weeks of an infection (including chest colds or gastrointestinal problems). Symptoms associated with infections include: Localized redness, fever, chills, night sweats or profuse sweating, burning sensation when voiding, cough, congestion, stuffiness, runny nose, sore throat, diarrhea, nausea, vomiting, cold or Flu symptoms, recent or current infections. It is specially important if the infection is over the area that we intend to treat. . Heart and lung problems: Symptoms that may suggest an active cardiopulmonary problem include: cough, chest pain, breathing difficulties or shortness of breath, dizziness, ankle swelling, uncontrolled high or unusually low blood pressure, and/or palpitations. If you are experiencing any of these symptoms, cancel your procedure and contact your primary care physician for an evaluation.  Remember:  Regular   Business hours are:  Monday to Thursday 8:00 AM to 4:00  PM  Provider's Schedule: Alyah Boehning, MD:  Procedure days: Tuesday and Thursday 7:30 AM to 4:00 PM  Bilal Lateef, MD:  Procedure days: Monday and Wednesday 7:30 AM to 4:00 PM ____________________________________________________________________________________________    

## 2019-08-30 NOTE — Telephone Encounter (Signed)
Patient would also like to have left knee injection at same time as right hip injection next week. Please ask Dr. Dossie Arbour if he will do this.

## 2019-09-07 ENCOUNTER — Other Ambulatory Visit: Payer: Self-pay

## 2019-09-07 ENCOUNTER — Encounter: Payer: Self-pay | Admitting: Pain Medicine

## 2019-09-07 ENCOUNTER — Ambulatory Visit (HOSPITAL_BASED_OUTPATIENT_CLINIC_OR_DEPARTMENT_OTHER): Payer: Medicare Other | Admitting: Pain Medicine

## 2019-09-07 ENCOUNTER — Ambulatory Visit
Admission: RE | Admit: 2019-09-07 | Discharge: 2019-09-07 | Disposition: A | Discharge status: Home / Self Care | Payer: Medicare Other | Source: Ambulatory Visit | Attending: Pain Medicine | Admitting: Pain Medicine

## 2019-09-07 VITALS — BP 139/90 | HR 61 | Temp 97.7°F | Resp 16 | Ht 70.0 in | Wt 250.0 lb

## 2019-09-07 DIAGNOSIS — M160 Bilateral primary osteoarthritis of hip: Secondary | ICD-10-CM

## 2019-09-07 DIAGNOSIS — M25559 Pain in unspecified hip: Secondary | ICD-10-CM | POA: Diagnosis present

## 2019-09-07 DIAGNOSIS — G8929 Other chronic pain: Secondary | ICD-10-CM | POA: Diagnosis present

## 2019-09-07 MED ORDER — IOHEXOL 180 MG/ML  SOLN
10.0000 mL | Freq: Once | INTRAMUSCULAR | Status: AC
  Administered 2019-09-07: 10 mL via EPIDURAL
  Filled 2019-09-07: qty 20

## 2019-09-07 MED ORDER — METHYLPREDNISOLONE ACETATE 80 MG/ML IJ SUSP
80.0000 mg | Freq: Once | INTRAMUSCULAR | Status: AC
  Administered 2019-09-07: 80 mg via INTRA_ARTICULAR
  Filled 2019-09-07: qty 1

## 2019-09-07 MED ORDER — LIDOCAINE HCL 2 % IJ SOLN
20.0000 mL | Freq: Once | INTRAMUSCULAR | Status: AC
  Administered 2019-09-07: 400 mg
  Filled 2019-09-07: qty 20

## 2019-09-07 MED ORDER — ROPIVACAINE HCL 2 MG/ML IJ SOLN
9.0000 mL | Freq: Once | INTRAMUSCULAR | Status: AC
  Administered 2019-09-07: 10 mL via INTRA_ARTICULAR
  Filled 2019-09-07: qty 10

## 2019-09-07 NOTE — Patient Instructions (Signed)

## 2019-09-07 NOTE — Progress Notes (Signed)
PROVIDER NOTE: Information contained herein reflects review and annotations entered in association with encounter. Interpretation of such information and data should be left to medically-trained personnel. Information provided to patient can be located elsewhere in the medical record under "Patient Instructions". Document created using STT-dictation technology, any transcriptional errors that may result from process are unintentional.    Patient: Howard Maxwell  Service Category: Procedure  Provider: Gaspar Cola, MD  DOB: [birth year]  DOS: 09/07/2019  Location: [facility]  MRN: [id number]  Setting: Ambulatory - outpatient  Referring Provider: Tracie Harrier, MD  Type: Established Patient  Specialty: Interventional Pain Management  PCP: Tracie Harrier, MD   Primary Reason for Visit: Interventional Pain Management Treatment. CC: Hip Pain (right)  Procedure:          Anesthesia, Analgesia, Anxiolysis:  Type: Intra-Articular Hip Injection #1  Primary Purpose: Diagnostic Region: Posterolateral hip joint area. Level: Lower pelvic and hip joint level. Target Area: Superior aspect of the hip joint cavity, going thru the superior portion of the capsular ligament. Approach: Posterolateral approach. Laterality: Right  Type: Local Anesthesia Indication(s): Analgesia         Route: Infiltration ([place]/IM) IV Access: Declined Sedation: Declined  Local Anesthetic: Lidocaine 1-2%  Position: Lateral Decubitus with bad side up Prepped Area: Entire Posterolateral hip area. DuraPrep (Iodine Povacrylex [0.7% available iodine] and Isopropyl Alcohol, 74% w/w)   Indications: 1. Chronic hip pain (Third area of Pain) (Bilateral) (R>L)   2. 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 (year old), male patient, seen today for interventional treatment. He  has a past surgical  history that includes Foot surgery (Bilateral); Anterior cervical decomp/discectomy fusion; Gallbladder surgery; Femur fracture surgery; Nasal reconstruction; Cardiac catheterization (12/07/2013); Back surgery; Cholecystectomy; Esophagogastroduodenoscopy (N/A, 12/18/2016); Colonoscopy with propofol (N/A, 12/18/2016); Cervical disc arthroplasty (N/A, 05/28/2017); Cataract extraction w/PHACO (Right, 06/22/2017); eye prosthesis; Anterior lateral lumbar fusion 4 levels (N/A, 08/23/2017); Lumbar percutaneous pedicle screw 4 level (N/A, 08/23/2017); Application of robotic assistance for spinal procedure (N/A, 08/23/2017); and Eye surgery (Left, 02/04/2018). Howard Maxwell has a current medication list which includes the following prescription(s): atorvastatin, calcium carb-cholecalciferol, carvedilol, cetirizine, cholecalciferol, coq10, docusate sodium, ferrous sulfate, fluocinonide gel, glipizide, guaifenesin, lactulose, losartan, magnesium oxide, metformin, mirabegron er, multiple vitamins-minerals, omega 3, omeprazole, oxycodone, [START ON 09/30/2019] oxycodone, [START ON 10/30/2019] oxycodone, polyethyl glycol-propyl glycol, senna-docusate, tobramycin-dexamethasone, vitamin b-12, vitamin c, and spironolactone, and the following Facility-Administered Medications: ondansetron (ZOFRAN) 4 mg in sodium chloride 0.9 % 50 mL IVPB. His primarily concern today is the Hip Pain (right)  Initial Vital Signs:  Pulse/HCG Rate: 61ECG Heart Rate: 64 Temp: 97.7 F (36.5 C) Resp: 18 BP: 124/62 SpO2: 95 %  BMI: Estimated body mass index is 35.87 kg/m as calculated from the following:   Height as of this encounter: 5\' 10"  (1.778 m).   Weight as of this encounter: 250 lb (113.4 kg).  Risk Assessment: Allergies: Reviewed. He is allergic to loratadine.  Allergy Precautions: None required Coagulopathies: Reviewed. None identified.  Blood-thinner therapy: None at this time Active Infection(s): Reviewed. None identified. Howard Maxwell is  afebrile  Site Confirmation: Howard Maxwell was asked to confirm the procedure and laterality before marking the site Procedure checklist: Completed Consent: Before the procedure and under the influence of no sedative(s), amnesic(s), or anxiolytics, the patient was informed of the treatment options, risks and possible complications. To fulfill our ethical  and legal obligations, as recommended by the American Medical Association's Code of Ethics, I have informed the patient of my clinical impression; the nature and purpose of the treatment or procedure; the risks, benefits, and possible complications of the intervention; the alternatives, including doing nothing; the risk(s) and benefit(s) of the alternative treatment(s) or procedure(s); and the risk(s) and benefit(s) of doing nothing. The patient was provided information about the general risks and possible complications associated with the procedure. These may include, but are not limited to: failure to achieve desired goals, infection, bleeding, organ or nerve damage, allergic reactions, paralysis, and death. In addition, the patient was informed of those risks and complications associated to the procedure, such as failure to decrease pain; infection; bleeding; organ or nerve damage with subsequent damage to sensory, motor, and/or autonomic systems, resulting in permanent pain, numbness, and/or weakness of one or several areas of the body; allergic reactions; (i.e.: anaphylactic reaction); and/or death. Furthermore, the patient was informed of those risks and complications associated with the medications. These include, but are not limited to: allergic reactions (i.e.: anaphylactic or anaphylactoid reaction(s)); adrenal axis suppression; blood sugar elevation that in diabetics may result in ketoacidosis or comma; water retention that in patients with history of congestive heart failure may result in shortness of breath, pulmonary edema, and decompensation  with resultant heart failure; weight gain; swelling or edema; medication-induced neural toxicity; particulate matter embolism and blood vessel occlusion with resultant organ, and/or nervous system infarction; and/or aseptic necrosis of one or more joints. Finally, the patient was informed that Medicine is not an exact science; therefore, there is also the possibility of unforeseen or unpredictable risks and/or possible complications that may result in a catastrophic outcome. The patient indicated having understood very clearly. We have given the patient no guarantees and we have made no promises. Enough time was given to the patient to ask questions, all of which were answered to the patient's satisfaction. Howard Maxwell has indicated that he wanted to continue with the procedure. Attestation: I, the ordering provider, attest that I have discussed with the patient the benefits, risks, side-effects, alternatives, likelihood of achieving goals, and potential problems during recovery for the procedure that I have provided informed consent. Date  Time: 09/07/2019  8:53 AM  Pre-Procedure Preparation:  Monitoring: As per clinic protocol. Respiration, ETCO2, SpO2, BP, heart rate and rhythm monitor placed and checked for adequate function Safety Precautions: Patient was assessed for positional comfort and pressure points before starting the procedure. Time-out: I initiated and conducted the "Time-out" before starting the procedure, as per protocol. The patient was asked to participate by confirming the accuracy of the "Time Out" information. Verification of the correct person, site, and procedure were performed and confirmed by me, the nursing staff, and the patient. "Time-out" conducted as per Joint Commission's Universal Protocol (UP.01.01.01). Time: 0938  Description of Procedure:          Safety Precautions: Aspiration looking for blood return was conducted prior to all injections. At no point did we inject  any substances, as a needle was being advanced. No attempts were made at seeking any paresthesias. Safe injection practices and needle disposal techniques used. Medications properly checked for expiration dates. SDV (single dose vial) medications used. Description of the Procedure: Protocol guidelines were followed. The patient was placed in position over the fluoroscopy table. The target area was identified and the area prepped in the usual manner. Skin & deeper tissues infiltrated with local anesthetic. Appropriate amount of time allowed to  pass for local anesthetics to take effect. The procedure needles were then advanced to the target area. Proper needle placement secured. Negative aspiration confirmed. Solution injected in intermittent fashion, asking for systemic symptoms every 0.5cc of injectate. The needles were then removed and the area cleansed, making sure to leave some of the prepping solution back to take advantage of its long term bactericidal properties. Vitals:   09/07/19 0851 09/07/19 0938 09/07/19 0942  BP: 124/62 (!) 150/72 139/90  Pulse: 61    Resp: 18 16 16   Temp: 97.7 F (36.5 C)    TempSrc: Temporal    SpO2: 95% 91% 93%  Weight: 250 lb (113.4 kg)    Height: 5\' 10"  (1.778 m)      Start Time: 0938 hrs. End Time: 0942 hrs. Materials:  Needle(s) Type: Spinal Needle Gauge: 22G Length: 5.0-in Medication(s): Please see orders for medications and dosing details.  Imaging Guidance (Non-Spinal):          Type of Imaging Technique: Fluoroscopy Guidance (Non-Spinal) Indication(s): Assistance in needle guidance and placement for procedures requiring needle placement in or near specific anatomical locations not easily accessible without such assistance. Exposure Time: Please see nurses notes. Contrast: Before injecting any contrast, we confirmed that the patient did not have an allergy to iodine, shellfish, or radiological contrast. Once satisfactory needle placement was completed  at the desired level, radiological contrast was injected. Contrast injected under live fluoroscopy. No contrast complications. See chart for type and volume of contrast used. Fluoroscopic Guidance: I was personally present during the use of fluoroscopy. "Tunnel Vision Technique" used to obtain the best possible view of the target area. Parallax error corrected before commencing the procedure. "Direction-depth-direction" technique used to introduce the needle under continuous pulsed fluoroscopy. Once target was reached, antero-posterior, oblique, and lateral fluoroscopic projection used confirm needle placement in all planes. Images permanently stored in EMR. Interpretation: I personally interpreted the imaging intraoperatively. Adequate needle placement confirmed in multiple planes. Appropriate spread of contrast into desired area was observed. No evidence of afferent or efferent intravascular uptake. Permanent images saved into the patient's record.  Antibiotic Prophylaxis:   Anti-infectives (From admission, onward)   None     Indication(s): None identified  Post-operative Assessment:  Post-procedure Vital Signs:  Pulse/HCG Rate: 61(!) 59 Temp: 97.7 F (36.5 C) Resp: 16 BP: 139/90 SpO2: 93 %  EBL: None  Complications: No immediate post-treatment complications observed by team, or reported by patient.  Note: The patient tolerated the entire procedure well. A repeat set of vitals were taken after the procedure and the patient was kept under observation following institutional policy, for this type of procedure. Post-procedural neurological assessment was performed, showing return to baseline, prior to discharge. The patient was provided with post-procedure discharge instructions, including a section on how to identify potential problems. Should any problems arise concerning this procedure, the patient was given instructions to immediately contact us, at any time, without hesitation. In any  case, we plan to contact the patient by telephone for a follow-up status report regarding this interventional procedure.  Comments:  No additional relevant information.  Plan of Care  Orders:  Orders Placed This Encounter  Procedures  . HIP INJECTION    Scheduling Instructions:     Side: Right-sided     Sedation: No Sedation.     Timeframe: Today  . [facility] C-ARM 1-60 MIN NO REPORT    Intraoperative interpretation by procedural physician at [facility].    Standing Status:  Standing    Number of Occurrences:   1    Order Specific Question:   Reason for exam:    Answer:   Assistance in needle guidance and placement for procedures requiring needle placement in or near specific anatomical locations not easily accessible without such assistance.  . Informed Consent Details: Physician/Practitioner Attestation; Transcribe to consent form and obtain patient signature    Nursing Order: Transcribe to consent form and obtain patient signature. Note: Always confirm laterality of pain with Howard Maxwell, before procedure. Procedure: Hip injection Indication/Reason: Hip Joint Pain (Arthralgia) Provider Attestation: I, Kirkwood Dossie Arbour, MD, (Pain Management Specialist), the physician/practitioner, attest that I have discussed with the patient the benefits, risks, side effects, alternatives, likelihood of achieving goals and potential problems during recovery for the procedure that I have provided informed consent.  . Provide equipment / supplies at bedside    Equipment required: Single use, disposable, "Block Tray"    Standing Status:   Standing    Number of Occurrences:   1    Order Specific Question:   Specify    Answer:   Block Tray   Chronic Opioid Analgesic:  Oxycodone IR 5 mg 1 tablet p.o. every 6 hours (20 mg/day of oxycodone) MME/day: 30 mg/day.   Medications ordered for procedure: Meds ordered this encounter  Medications  . iohexol (OMNIPAQUE) 180 MG/ML  injection 10 mL    Must be Myelogram-compatible. If not available, you may substitute with a water-soluble, non-ionic, hypoallergenic, myelogram-compatible radiological contrast medium.  Marland Kitchen lidocaine (XYLOCAINE) 2 % (with pres) injection 400 mg  . methylPREDNISolone acetate (DEPO-MEDROL) injection 80 mg  . ropivacaine (PF) 2 mg/mL (0.2%) (NAROPIN) injection 9 mL   Medications administered: We administered iohexol, lidocaine, methylPREDNISolone acetate, and ropivacaine (PF) 2 mg/mL (0.2%).  See the medical record for exact dosing, route, and time of administration.  Follow-up plan:   Return in about 2 weeks (around 09/21/2019) for (VV), (PP).       Interventional management options: Planned, scheduled, and/or pending: Not at this time.   Considering: Diagnostic bilateral lumbar facetblock  Possible bilateral lumbar facet RFA Diagnostic L3-4 interspinous ligament block Possible L3-4 bilateral medial branch RFA Diagnostic right sided L2-3 and L3-4 lumbar epiduralsteroid injection  Diagnostic bilateral L1-2 &L4-5 transforaminalepidural steroid injection  Diagnostic bilateral L2-3 transforaminal epidural steroid injection Diagnostic caudal epidural steroid injection + diagnostic epidurogram Possible Racz procedure Diagnostic bilateral intra-articular hipjoint injection  Diagnostic bilateral femoral nerve and obturator nerveblock  Possible bilateral femoral nerve and obturator nerve RFA Diagnostic left cervical epidural steroid injection  Diagnostic bilateral cervical facet block Possible bilateral cervical facet RFA Diagnostic left Genicular nerve block Possible left Genicular nerve radiofrequencyablation  Possible intrathecal opioid trial   Palliative PRN treatment(s): Palliative left IA Hyalgan knee injection S2/N1  Therapeutic bilateral L2 TFESI #2  Therapeutic right-sided L3-4 LESI #2     Recent Visits Date Type Provider Dept  08/30/19 Telemedicine  Milinda Pointer, MD [facility]  Showing recent visits within past 90 days and meeting all other requirements Today's Visits Date Type Provider Dept  09/07/19 Procedure visit Milinda Pointer, MD [facility]  Showing today's visits and meeting all other requirements Future Appointments Date Type Provider Dept  09/26/19 Appointment Milinda Pointer, [facility]  11/22/19 Appointment Milinda Pointer, MD [facility]  Showing future appointments within next 90 days and meeting all other requirements  Disposition: Discharge home  Discharge (Date  Time): 09/07/2019; 0950 hrs.   Primary Care Physician:  Tracie Harrier, MD Location: [facility] Note by: Gaspar Cola, MD Date: 09/07/2019; Time: 5:30 PM  Disclaimer:  Medicine is not an Chief Strategy Officer. The only guarantee in medicine is that nothing is guaranteed. It is important to note that the decision to proceed with this intervention was based on the information collected from the patient. The Data and conclusions were drawn from the patient's questionnaire, the interview, and the physical examination. Because the information was provided in large part by the patient, it cannot be guaranteed that it has not been purposely or unconsciously manipulated. Every effort has been made to obtain as much relevant data as possible for this evaluation. It is important to note that the conclusions that lead to this procedure are derived in large part from the available data. Always take into account that the treatment will also be dependent on availability of resources and existing treatment guidelines, considered by other Pain Management Practitioners as being common knowledge and practice, at the time of the intervention. For Medico-Legal purposes, it is also important to point out that variation in procedural techniques and pharmacological choices are the acceptable norm.  The indications, contraindications, technique, and results of the above procedure should only be interpreted and judged by a Board-Certified Interventional Pain Specialist with extensive familiarity and expertise in the same exact procedure and technique.

## 2019-09-08 ENCOUNTER — Telehealth: Payer: Self-pay

## 2019-09-08 NOTE — Telephone Encounter (Signed)
Post procedure phone call.  Patient states he is doing good.  

## 2019-09-21 ENCOUNTER — Telehealth: Payer: Self-pay | Admitting: *Deleted

## 2019-09-21 NOTE — Telephone Encounter (Signed)
Voicemail left with patient to please call us re; VV on Tuesday July 6 with Dr Dossie Arbour.

## 2019-09-26 ENCOUNTER — Other Ambulatory Visit: Payer: Self-pay

## 2019-09-26 ENCOUNTER — Ambulatory Visit: Payer: Medicare Other | Attending: Pain Medicine | Admitting: Pain Medicine

## 2019-09-26 DIAGNOSIS — M79601 Pain in right arm: Secondary | ICD-10-CM

## 2019-09-26 DIAGNOSIS — M545 Low back pain, unspecified: Secondary | ICD-10-CM

## 2019-09-26 DIAGNOSIS — G894 Chronic pain syndrome: Secondary | ICD-10-CM

## 2019-09-26 DIAGNOSIS — G8929 Other chronic pain: Secondary | ICD-10-CM

## 2019-09-26 DIAGNOSIS — Z79899 Other long term (current) drug therapy: Secondary | ICD-10-CM

## 2019-09-26 DIAGNOSIS — M25559 Pain in unspecified hip: Secondary | ICD-10-CM | POA: Diagnosis not present

## 2019-09-26 DIAGNOSIS — M79602 Pain in left arm: Secondary | ICD-10-CM
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
## 2019-10-06 LAB — TOXASSURE SELECT 13 (MW), URINE

## 2019-10-25 ENCOUNTER — Ambulatory Visit: Payer: Medicare Other | Admitting: Podiatry

## 2019-10-25 ENCOUNTER — Encounter: Payer: Self-pay | Admitting: Podiatry

## 2019-10-25 ENCOUNTER — Other Ambulatory Visit: Payer: Self-pay

## 2019-10-25 DIAGNOSIS — L97521 Non-pressure chronic ulcer of other part of left foot limited to breakdown of skin: Secondary | ICD-10-CM | POA: Diagnosis not present
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
## 2019-10-30 ENCOUNTER — Other Ambulatory Visit: Payer: Self-pay | Admitting: Pain Medicine

## 2019-10-30 DIAGNOSIS — G894 Chronic pain syndrome: Secondary | ICD-10-CM

## 2019-11-17 DIAGNOSIS — I10 Essential (primary) hypertension: Secondary | ICD-10-CM | POA: Insufficient documentation

## 2019-11-22 ENCOUNTER — Encounter: Payer: Medicare Other | Admitting: Pain Medicine
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
## 2019-11-28 ENCOUNTER — Encounter: Payer: Self-pay | Admitting: Pain Medicine

## 2019-11-28 ENCOUNTER — Other Ambulatory Visit: Payer: Self-pay

## 2019-11-28 ENCOUNTER — Ambulatory Visit
Admission: RE | Admit: 2019-11-28 | Discharge: 2019-11-28 | Disposition: A | Discharge status: Home / Self Care | Payer: Medicare Other | Source: Ambulatory Visit | Attending: Pain Medicine | Admitting: Pain Medicine

## 2019-11-28 ENCOUNTER — Ambulatory Visit (HOSPITAL_BASED_OUTPATIENT_CLINIC_OR_DEPARTMENT_OTHER): Payer: Medicare Other | Admitting: Pain Medicine

## 2019-11-28 ENCOUNTER — Ambulatory Visit
Admission: RE | Admit: 2019-11-28 | Discharge: 2019-11-28 | Disposition: A | Discharge status: Home / Self Care | Payer: Medicare Other | Attending: Pain Medicine | Admitting: Pain Medicine

## 2019-11-28 VITALS — BP 132/67 | HR 68 | Temp 97.6°F | Resp 16 | Ht 70.0 in | Wt 250.0 lb

## 2019-11-28 DIAGNOSIS — Z79899 Other long term (current) drug therapy: Secondary | ICD-10-CM

## 2019-11-28 DIAGNOSIS — G8929 Other chronic pain: Secondary | ICD-10-CM | POA: Insufficient documentation

## 2019-11-28 DIAGNOSIS — M542 Cervicalgia: Secondary | ICD-10-CM | POA: Insufficient documentation

## 2019-11-28 DIAGNOSIS — G894 Chronic pain syndrome: Secondary | ICD-10-CM | POA: Insufficient documentation

## 2019-11-28 DIAGNOSIS — M79602 Pain in left arm: Secondary | ICD-10-CM | POA: Insufficient documentation

## 2019-11-28 DIAGNOSIS — F1120 Opioid dependence, uncomplicated: Secondary | ICD-10-CM

## 2019-11-28 DIAGNOSIS — M7918 Myalgia, other site: Secondary | ICD-10-CM

## 2019-11-28 DIAGNOSIS — M79601 Pain in right arm: Secondary | ICD-10-CM

## 2019-11-28 DIAGNOSIS — M5414 Radiculopathy, thoracic region: Secondary | ICD-10-CM

## 2019-11-28 DIAGNOSIS — M25512 Pain in left shoulder: Secondary | ICD-10-CM | POA: Diagnosis not present

## 2019-11-28 DIAGNOSIS — M545 Low back pain, unspecified: Secondary | ICD-10-CM

## 2019-11-28 DIAGNOSIS — K219 Gastro-esophageal reflux disease without esophagitis: Secondary | ICD-10-CM | POA: Insufficient documentation

## 2019-11-28 DIAGNOSIS — M25559 Pain in unspecified hip: Secondary | ICD-10-CM | POA: Insufficient documentation

## 2019-11-28 MED ORDER — MORPHINE SULFATE ER 15 MG PO TBCR: 15.0000 mg | EXTENDED_RELEASE_TABLET | Freq: Two times a day (BID) | ORAL | 0 refills | Status: DC

## 2019-11-28 MED ORDER — MAGNESIUM OXIDE -MG SUPPLEMENT 500 MG PO CAPS: 1.0000 | ORAL_CAPSULE | Freq: Two times a day (BID) | ORAL | 0 refills | Status: DC

## 2019-11-28 NOTE — Patient Instructions (Addendum)
____________________________________________________________________________________________  Medication Rules  Purpose: To inform patients, and their family members, of our rules and regulations.  Applies to: All patients receiving prescriptions (written or electronic).  Pharmacy of record: Pharmacy where electronic prescriptions will be sent. If written prescriptions are taken to a different pharmacy, please inform the nursing staff. The pharmacy listed in the electronic medical record should be the one where you would like electronic prescriptions to be sent.  Electronic prescriptions: In compliance with the [place] (STOP) Act of 2017 (Session Lanny Cramp [phone]), effective March 23, 2018, all controlled substances must be electronically prescribed. Calling prescriptions to the pharmacy will cease to exist.  Prescription refills: Only during scheduled appointments. Applies to all prescriptions.  NOTE: The following applies primarily to controlled substances (Opioid* Pain Medications).   Type of encounter (visit): For patients receiving controlled substances, face-to-face visits are required. (Not an option or up to the patient.)  Patient's responsibilities: 1. Pain Pills: Bring all pain pills to every appointment (except for procedure appointments). 2. Pill Bottles: Bring pills in original pharmacy bottle. Always bring the newest bottle. Bring bottle, even if empty. 3. Medication refills: You are responsible for knowing and keeping track of what medications you take and those you need refilled. The day before your appointment: write a list of all prescriptions that need to be refilled. The day of the appointment: give the list to the admitting nurse. Prescriptions will be written only during appointments. No prescriptions will be written on procedure days. If you forget a medication: it will not be "Called in", "Faxed", or "electronically sent".  You will need to get another appointment to get these prescribed. No early refills. Do not call asking to have your prescription filled early. 4. Prescription Accuracy: You are responsible for carefully inspecting your prescriptions before leaving our office. Have the discharge nurse carefully go over each prescription with you, before taking them home. Make sure that your name is accurately spelled, that your address is correct. Check the name and dose of your medication to make sure it is accurate. Check the number of pills, and the written instructions to make sure they are clear and accurate. Make sure that you are given enough medication to last until your next medication refill appointment. 5. Taking Medication: Take medication as prescribed. When it comes to controlled substances, taking less pills or less frequently than prescribed is permitted and encouraged. Never take more pills than instructed. Never take medication more frequently than prescribed.  6. Inform other Doctors: Always inform, all of your healthcare providers, of all the medications you take. 7. Pain Medication from other Providers: You are not allowed to accept any additional pain medication from any other Doctor or Healthcare provider. There are two exceptions to this rule. (see below) In the event that you require additional pain medication, you are responsible for notifying us, as stated below. 8. Medication Agreement: You are responsible for carefully reading and following our Medication Agreement. This must be signed before receiving any prescriptions from our practice. Safely store a copy of your signed Agreement. Violations to the Agreement will result in no further prescriptions. (Additional copies of our Medication Agreement are available upon request.) 9. Laws, Rules, & Regulations: All patients are expected to follow all Federal and [employer], [employer], Rules, [employer]. Ignorance of the Laws does not constitute a  valid excuse.  10. Illegal drugs and Controlled Substances: The use of illegal substances (including, but not limited to marijuana and its  derivatives) and/or the illegal use of any controlled substances is strictly prohibited. Violation of this rule may result in the immediate and permanent discontinuation of any and all prescriptions being written by our practice. The use of any illegal substances is prohibited. 11. Adopted CDC guidelines & recommendations: Target dosing levels will be at or below 60 MME/day. Use of benzodiazepines** is not recommended.  Exceptions: There are only two exceptions to the rule of not receiving pain medications from other Healthcare Providers. 1. Exception #1 (Emergencies): In the event of an emergency (i.e.: accident requiring emergency care), you are allowed to receive additional pain medication. However, you are responsible for: As soon as you are able, call our office [phone], at any time of the day or night, and leave a message stating your name, the date and nature of the emergency, and the name and dose of the medication prescribed. In the event that your call is answered by a member of our staff, make sure to document and save the date, time, and the name of the person that took your information.  2. Exception #2 (Planned Surgery): In the event that you are scheduled by another doctor or dentist to have any type of surgery or procedure, you are allowed (for a period no longer than 30 days), to receive additional pain medication, for the acute post-op pain. However, in this case, you are responsible for picking up a copy of our "Post-op Pain Management for Surgeons" handout, and giving it to your surgeon or dentist. This document is available at our office, and does not require an appointment to obtain it. Simply go to our office during business hours (Monday-Thursday from 8:00 AM to 4:00 PM) (Friday 8:00 AM to 12:00 Noon) or if you have a scheduled appointment  with [place], prior to your surgery, and ask for it by name. In addition, you are responsible for: calling our office [phone], at any time of the day or night, and leaving a message stating your name, name of your surgeon, type of surgery, and date of procedure or surgery. Failure to comply with your responsibilities may result in termination of therapy involving the controlled substances.  *Opioid medications include: morphine, codeine, oxycodone, oxymorphone, hydrocodone, hydromorphone, meperidine, tramadol, tapentadol, buprenorphine, fentanyl, methadone. **Benzodiazepine medications include: diazepam (Valium), alprazolam (Xanax), clonazepam (Klonopine), lorazepam (Ativan), clorazepate (Tranxene), chlordiazepoxide (Librium), estazolam (Prosom), oxazepam (Serax), temazepam (Restoril), triazolam (Halcion) (Last updated: 11/28/2019) ____________________________________________________________________________________________   ____________________________________________________________________________________________  Medication Recommendations and Reminders  Applies to: All patients receiving prescriptions (written and/or electronic).  Medication Rules & Regulations: These rules and regulations exist for your safety and that of others. They are not flexible and neither are we. Dismissing or ignoring them will be considered "non-compliance" with medication therapy, resulting in complete and irreversible termination of such therapy. (See document titled "Medication Rules" for more details.) In all conscience, because of safety reasons, we cannot continue providing a therapy where the patient does not follow instructions.  Pharmacy of record:   Definition: This is the pharmacy where your electronic prescriptions will be sent.   We do not endorse any particular pharmacy, however, we have experienced problems with [facility] not securing enough medication supply for the community.  We do not  restrict you in your choice of pharmacy. However, once we write for your prescriptions, we will NOT be re-sending more prescriptions to fix restricted supply problems created by your pharmacy, or your insurance.   The pharmacy listed in the electronic medical record should be the  one where you want electronic prescriptions to be sent.  If you choose to change pharmacy, simply notify our nursing staff.  Recommendations:  Keep all of your pain medications in a safe place, under lock and key, even if you live alone. We will NOT replace lost, stolen, or damaged medication.  After you fill your prescription, take 1 week's worth of pills and put them away in a safe place. You should keep a separate, properly labeled bottle for this purpose. The remainder should be kept in the original bottle. Use this as your primary supply, until it runs out. Once it's gone, then you know that you have 1 week's worth of medicine, and it is time to come in for a prescription refill. If you do this correctly, it is unlikely that you will ever run out of medicine.  To make sure that the above recommendation works, it is very important that you make sure your medication refill appointments are scheduled at least 1 week before you run out of medicine. To do this in an effective manner, make sure that you do not leave the office without scheduling your next medication management appointment. Always ask the nursing staff to show you in your prescription , when your medication will be running out. Then arrange for the receptionist to get you a return appointment, at least 7 days before you run out of medicine. Do not wait until you have 1 or 2 pills left, to come in. This is very poor planning and does not take into consideration that we may need to cancel appointments due to bad weather, sickness, or emergencies affecting our staff.  DO NOT ACCEPT A "Partial Fill": If for any reason your pharmacy does not have enough pills/tablets  to completely fill or refill your prescription, do not allow for a "partial fill". The law allows the pharmacy to complete that prescription within 72 hours, without requiring a new prescription. If they do not fill the rest of your prescription within those 72 hours, you will need a separate prescription to fill the remaining amount, which we will NOT provide. If the reason for the partial fill is your insurance, you will need to talk to the pharmacist about payment alternatives for the remaining tablets, but again, DO NOT ACCEPT A PARTIAL FILL, unless you can trust your pharmacist to obtain the remainder of the pills within 72 hours.  Prescription refills and/or changes in medication(s):   Prescription refills, and/or changes in dose or medication, will be conducted only during scheduled medication management appointments. (Applies to both, written and electronic prescriptions.)  No refills on procedure days. No medication will be changed or started on procedure days. No changes, adjustments, and/or refills will be conducted on a procedure day. Doing so will interfere with the diagnostic portion of the procedure.  No phone refills. No medications will be "called into the pharmacy".  No Fax refills.  No weekend refills.  No Holliday refills.  No after hours refills.  Remember:  Business hours are:  Monday to Thursday 8:00 AM to 4:00 PM Provider's Schedule: Milinda Pointer, MD - Appointments are:  Medication management: Monday and Wednesday 8:00 AM to 4:00 PM Procedure day: Tuesday and Thursday 7:30 AM to 4:00 PM Gillis Santa, MD - Appointments are:  Medication management: Tuesday and Thursday 8:00 AM to 4:00 PM Procedure day: Monday and Wednesday 7:30 AM to 4:00 PM (Last update: 10/11/2019) ____________________________________________________________________________________________   ____________________________________________________________________________________________  CBD  (cannabidiol) WARNING  Applicable to: All individuals currently  taking or considering taking CBD (cannabidiol) and, more important, all patients taking opioid analgesic controlled substances (pain medication). (Example: oxycodone; oxymorphone; hydrocodone; hydromorphone; morphine; methadone; tramadol; tapentadol; fentanyl; buprenorphine; butorphanol; dextromethorphan; meperidine; codeine; etc.)  Legal status: CBD remains a Schedule I drug prohibited for any use. CBD is illegal with one exception. In the [place], CBD has a limited Transport planner (FDA) approval for the treatment of two specific types of epilepsy disorders. Only one CBD product has been approved by the FDA for this purpose: "Epidiolex". FDA is aware that some companies are marketing products containing cannabis and cannabis-derived compounds in ways that violate the [employer], Drug and Cosmetic Act [facility] Act) and that may put the health and safety of consumers at risk. The FDA, a Federal agency, has not enforced the CBD status since 2018.   Legality: Some manufacturers ship CBD products nationally, which is illegal. Often such products are sold online and are therefore available throughout the country. CBD is openly sold in head shops and health food stores in some states where such sales have not been explicitly legalized. Selling unapproved products with unsubstantiated therapeutic claims is not only a violation of the law, but also can put patients at risk, as these products have not been proven to be safe or effective. Federal illegality makes it difficult to conduct research on CBD.  Reference: "FDA Regulation of Cannabis and Cannabis-Derived Products, Including Cannabidiol (CBD)" - [employer]  Warning: CBD is not FDA approved and has not undergo the same manufacturing controls as prescription  drugs.  This means that the purity and safety of available CBD may be questionable. Most of the time, despite manufacturer's claims, it is contaminated with THC (delta-9-tetrahydrocannabinol - the chemical in marijuana responsible for the "HIGH").  When this is the case, the [facility] contaminant will trigger a positive urine drug screen (UDS) test for Marijuana (carboxy-THC). Because a positive UDS for any illicit substance is a violation of our medication agreement, your opioid analgesics (pain medicine) may be permanently discontinued.  MORE ABOUT CBD  General Information: CBD  is a derivative of the Marijuana (cannabis sativa) plant discovered in 53. It is one of the 113 identified substances found in Marijuana. It accounts for up to 40% of the plant's extract. As of 2018, preliminary clinical studies on CBD included research for the treatment of anxiety, movement disorders, and pain. CBD is available and consumed in multiple forms, including inhalation of smoke or vapor, as an aerosol spray, and by mouth. It may be supplied as an oil containing CBD, capsules, dried cannabis, or as a liquid solution. CBD is thought not to be as psychoactive as THC (delta-9-tetrahydrocannabinol - the chemical in marijuana responsible for the "HIGH"). Studies suggest that CBD may interact with different biological target receptors in the body, including cannabinoid and other neurotransmitter receptors. As of 2018 the mechanism of action for its biological effects has not been determined.  Side-effects  Adverse reactions: Dry mouth, diarrhea, decreased appetite, fatigue, drowsiness, malaise, weakness, sleep disturbances, and others.  Drug interactions: CBC may interact with other medications such as blood-thinners. (Last update:  10/28/2019) ____________________________________________________________________________________________   ____________________________________________________________________________________________  Preparing for your procedure (without sedation)  Procedure appointments are limited to planned procedures: . No Prescription Refills. . No disability issues will be discussed. . No medication changes will be discussed.  Instructions: . Oral Intake: Do not eat or drink anything for at least 6 hours prior to your procedure. (Exception: Blood Pressure Medication. See below.) .  Transportation: Unless otherwise stated by your physician, you may drive yourself after the procedure. . Blood Pressure Medicine: Do not forget to take your blood pressure medicine with a sip of water the morning of the procedure. If your Diastolic (lower reading)is above 100 mmHg, elective cases will be cancelled/rescheduled. . Blood thinners: These will need to be stopped for procedures. Notify our staff if you are taking any blood thinners. Depending on which one you take, there will be specific instructions on how and when to stop it. . Diabetics on insulin: Notify the staff so that you can be scheduled 1st case in the morning. If your diabetes requires high dose insulin, take only  of your normal insulin dose the morning of the procedure and notify the staff that you have done so. . Preventing infections: Shower with an antibacterial soap the morning of your procedure.  . Build-up your immune system: Take 1000 mg of Vitamin C with every meal (3 times a day) the day prior to your procedure. Marland Kitchen Antibiotics: Inform the staff if you have a condition or reason that requires you to take antibiotics before dental procedures. . Pregnancy: If you are pregnant, call and cancel the procedure. . Sickness: If you have a cold, fever, or any active infections, call and cancel the procedure. . Arrival: You must be in the facility at  least 30 minutes prior to your scheduled procedure. . Children: Do not bring any children with you. . Dress appropriately: Bring dark clothing that you would not mind if they get stained. . Valuables: Do not bring any jewelry or valuables.  Reasons to call and reschedule or cancel your procedure: (Following these recommendations will minimize the risk of a serious complication.) . Surgeries: Avoid having procedures within 2 weeks of any surgery. (Avoid for 2 weeks before or after any surgery). . Flu Shots: Avoid having procedures within 2 weeks of a flu shots or . (Avoid for 2 weeks before or after immunizations). . Barium: Avoid having a procedure within 7-10 days after having had a radiological study involving the use of radiological contrast. (Myelograms, Barium swallow or enema study). . Heart attacks: Avoid any elective procedures or surgeries for the initial 6 months after a "Myocardial Infarction" (Heart Attack). . Blood thinners: It is imperative that you stop these medications before procedures. Let us know if you if you take any blood thinner.  . Infection: Avoid procedures during or within two weeks of an infection (including chest colds or gastrointestinal problems). Symptoms associated with infections include: Localized redness, fever, chills, night sweats or profuse sweating, burning sensation when voiding, cough, congestion, stuffiness, runny nose, sore throat, diarrhea, nausea, vomiting, cold or Flu symptoms, recent or current infections. It is specially important if the infection is over the area that we intend to treat. Marland Kitchen Heart and lung problems: Symptoms that may suggest an active cardiopulmonary problem include: cough, chest pain, breathing difficulties or shortness of breath, dizziness, ankle swelling, uncontrolled high or unusually low blood pressure, and/or palpitations. If you are experiencing any of these symptoms, cancel your procedure and contact your primary care physician for  an evaluation.  Remember:  Regular Business hours are:  Monday to Thursday 8:00 AM to 4:00 PM  Provider's Schedule: Milinda Pointer, MD:  Procedure days: Tuesday and Thursday 7:30 AM to 4:00 PM  Gillis Santa, MD:  Procedure days: Monday and Wednesday 7:30 AM to 4:00 PM ____________________________________________________________________________________________

## 2019-11-28 NOTE — Progress Notes (Signed)
Nursing Pain Medication Assessment:  Safety precautions to be maintained throughout the outpatient stay will include: orient to surroundings, keep bed in low position, maintain call bell within reach at all times, provide assistance with transfer out of bed and ambulation.  Medication Inspection Compliance: Pill count conducted under aseptic conditions, in front of the patient. Neither the pills nor the bottle was removed from the patient's sight at any time. Once count was completed pills were immediately returned to the patient in their original bottle.  Medication: Oxycodone IR Pill/Patch Count: 2 of 120 pills remain Pill/Patch Appearance: Markings consistent with prescribed medication Bottle Appearance: Standard pharmacy container. Clearly labeled. Filled Date: 08 / 09 / 2021 Last Medication intake:  Today

## 2019-11-29 ENCOUNTER — Encounter: Payer: Self-pay | Admitting: Podiatry

## 2019-11-29 ENCOUNTER — Other Ambulatory Visit: Payer: Medicare Other | Admitting: Orthotics

## 2019-11-29 ENCOUNTER — Other Ambulatory Visit: Payer: Self-pay

## 2019-11-29 ENCOUNTER — Ambulatory Visit: Payer: Medicare Other | Admitting: Podiatry

## 2019-11-29 DIAGNOSIS — L97521 Non-pressure chronic ulcer of other part of left foot limited to breakdown of skin: Secondary | ICD-10-CM | POA: Diagnosis not present

## 2019-11-29 NOTE — Progress Notes (Signed)
PROVIDER NOTE: Information contained herein reflects review and annotations entered in association with encounter. Interpretation of such information and data should be left to medically-trained personnel. Information provided to patient can be located elsewhere in the medical record under "Patient Instructions". Document created using STT-dictation technology, any transcriptional errors that may result from process are unintentional.    Patient: Howard Maxwell  Service Category: Procedure  Provider: Gaspar Cola, MD  DOB: 1945/08/31  DOS: 11/30/2019  Location: [facility]  MRN: [id number]  Setting: Ambulatory - outpatient  Referring Provider: Tracie Harrier, MD  Type: Established Patient  Specialty: Interventional Pain Management  PCP: Tracie Harrier, MD   Primary Reason for Visit: Interventional Pain Management Treatment. CC: Shoulder Pain (left)  Procedure:          Anesthesia, Analgesia, Anxiolysis:  Type: Diagnostic Suprascapular nerve Block #1  Primary Purpose: Diagnostic Region: Posterior Shoulder & Scapular Areas Level: Superior to the scapular spine, in the lateral aspect of the supraspinatus fossa (Suprescapular notch). Target Area: Suprascapular nerve as it passes thru the lower portion of the suprascapular notch. Approach: Posterior percutaneous approach. Laterality: Left-Side  Type: Local Anesthesia Indication(s): Analgesia         Route: Infiltration (Juab/IM) IV Access: Declined Sedation: Declined  Local Anesthetic: Lidocaine 1-2%  Position: Prone   Indications: 1. Chronic shoulder pain (Left)    Pain Score: Pre-procedure: 4 /10 Post-procedure: 4 /10   Pre-op Assessment:  Howard Maxwell is a 74 y.o. (year old), male patient, seen today for interventional treatment. He  has a past surgical history that includes Foot surgery (Bilateral); Anterior cervical decomp/discectomy fusion; Gallbladder surgery; Femur fracture surgery; Nasal reconstruction;  Cardiac catheterization (12/07/2013); Back surgery; Cholecystectomy; Esophagogastroduodenoscopy (N/A, 12/18/2016); Colonoscopy with propofol (N/A, 12/18/2016); Cervical disc arthroplasty (N/A, 05/28/2017); Cataract extraction w/PHACO (Right, 06/22/2017); eye prosthesis; Anterior lateral lumbar fusion 4 levels (N/A, 08/23/2017); Lumbar percutaneous pedicle screw 4 level (N/A, 08/23/2017); Application of robotic assistance for spinal procedure (N/A, 08/23/2017); and Eye surgery (Left, 02/04/2018). Howard Maxwell has a current medication list which includes the following prescription(s): atorvastatin, calcium carb-cholecalciferol, carvedilol, cetirizine, cholecalciferol, coq10, docusate sodium, ferrous sulfate, fluocinonide gel, fluorouracil, glipizide, guaifenesin, losartan, magnesium oxide, metformin, mirabegron er, morphine, [START ON 12/29/2019] morphine, [START ON 01/28/2020] morphine, multiple vitamins-minerals, omega 3, polyethyl glycol-propyl glycol, spironolactone, tobramycin-dexamethasone, vitamin b-12, vitamin c, lactulose, omeprazole, oxycodone, ozempic (1 mg/dose), and senna-docusate, and the following Facility-Administered Medications: ondansetron (ZOFRAN) 4 mg in sodium chloride 0.9 % 50 mL IVPB. His primarily concern today is the Shoulder Pain (left)  Initial Vital Signs:  Pulse/HCG Rate: 63ECG Heart Rate: 64 Temp: 97.9 F (36.6 C) Resp: 18 BP: 110/71 SpO2: 96 %  BMI: Estimated body mass index is 34.44 kg/m as calculated from the following:   Height as of this encounter: 5\' 10"  (1.778 m).   Weight as of this encounter: 240 lb (108.9 kg).  Risk Assessment: Allergies: Reviewed. He is allergic to loratadine.  Allergy Precautions: None required Coagulopathies: Reviewed. None identified.  Blood-thinner therapy: None at this time Active Infection(s): Reviewed. None identified. Howard Maxwell is afebrile  Site Confirmation: Howard Maxwell was asked to confirm the procedure and laterality before marking the  site Procedure checklist: Completed Consent: Before the procedure and under the influence of no sedative(s), amnesic(s), or anxiolytics, the patient was informed of the treatment options, risks and possible complications. To fulfill our ethical and legal obligations, as recommended by the American Medical Association's Code of Ethics, I have informed the patient of my clinical  impression; the nature and purpose of the treatment or procedure; the risks, benefits, and possible complications of the intervention; the alternatives, including doing nothing; the risk(s) and benefit(s) of the alternative treatment(s) or procedure(s); and the risk(s) and benefit(s) of doing nothing. The patient was provided information about the general risks and possible complications associated with the procedure. These may include, but are not limited to: failure to achieve desired goals, infection, bleeding, organ or nerve damage, allergic reactions, paralysis, and death. In addition, the patient was informed of those risks and complications associated to the procedure, such as failure to decrease pain; infection; bleeding; organ or nerve damage with subsequent damage to sensory, motor, and/or autonomic systems, resulting in permanent pain, numbness, and/or weakness of one or several areas of the body; allergic reactions; (i.e.: anaphylactic reaction); and/or death. Furthermore, the patient was informed of those risks and complications associated with the medications. These include, but are not limited to: allergic reactions (i.e.: anaphylactic or anaphylactoid reaction(s)); adrenal axis suppression; blood sugar elevation that in diabetics may result in ketoacidosis or comma; water retention that in patients with history of congestive heart failure may result in shortness of breath, pulmonary edema, and decompensation with resultant heart failure; weight gain; swelling or edema; medication-induced neural toxicity; particulate matter  embolism and blood vessel occlusion with resultant organ, and/or nervous system infarction; and/or aseptic necrosis of one or more joints. Finally, the patient was informed that Medicine is not an exact science; therefore, there is also the possibility of unforeseen or unpredictable risks and/or possible complications that may result in a catastrophic outcome. The patient indicated having understood very clearly. We have given the patient no guarantees and we have made no promises. Enough time was given to the patient to ask questions, all of which were answered to the patient's satisfaction. Howard Maxwell has indicated that he wanted to continue with the procedure. Attestation: I, the ordering provider, attest that I have discussed with the patient the benefits, risks, side-effects, alternatives, likelihood of achieving goals, and potential problems during recovery for the procedure that I have provided informed consent. Date  Time: 11/30/2019  8:09 AM  Pre-Procedure Preparation:  Monitoring: As per clinic protocol. Respiration, ETCO2, SpO2, BP, heart rate and rhythm monitor placed and checked for adequate function Safety Precautions: Patient was assessed for positional comfort and pressure points before starting the procedure. Time-out: I initiated and conducted the "Time-out" before starting the procedure, as per protocol. The patient was asked to participate by confirming the accuracy of the "Time Out" information. Verification of the correct person, site, and procedure were performed and confirmed by me, the nursing staff, and the patient. "Time-out" conducted as per Joint Commission's Universal Protocol (UP.01.01.01). Time: 0836  Description of Procedure:          Area Prepped: Entire shoulder Area DuraPrep (Iodine Povacrylex [0.7% available iodine] and Isopropyl Alcohol, 74% w/w) Safety Precautions: Aspiration looking for blood return was conducted prior to all injections. At no point did we  inject any substances, as a needle was being advanced. No attempts were made at seeking any paresthesias. Safe injection practices and needle disposal techniques used. Medications properly checked for expiration dates. SDV (single dose vial) medications used. Description of the Procedure: Protocol guidelines were followed. The patient was placed in position over the procedure table. The target area was identified and the area prepped in the usual manner. Skin & deeper tissues infiltrated with local anesthetic. Appropriate amount of time allowed to pass for local anesthetics to  take effect. The procedure needles were then advanced to the target area. Proper needle placement secured. Negative aspiration confirmed. Solution injected in intermittent fashion, asking for systemic symptoms every 0.5cc of injectate. The needles were then removed and the area cleansed, making sure to leave some of the prepping solution back to take advantage of its long term bactericidal properties.  Vitals:   11/30/19 0807 11/30/19 0834 11/30/19 0839 11/30/19 0841  BP: 110/71 (!) 143/87 140/90 (!) 158/86  Pulse: 63     Resp: 18 16 18 18   Temp: 97.9 F (36.6 C)     TempSrc: Temporal     SpO2: 96% 95% 95% 95%  Weight: 240 lb (108.9 kg)     Height: 5\' 10"  (1.778 m)       Start Time: 0836 hrs. End Time: 0840 hrs. Materials:  Needle(s) Type: Spinal Needle Gauge: 22G Length: 3.5-in Medication(s): Please see orders for medications and dosing details.  Imaging Guidance (Non-Spinal):          Type of Imaging Technique: Fluoroscopy Guidance (Non-Spinal) Indication(s): Assistance in needle guidance and placement for procedures requiring needle placement in or near specific anatomical locations not easily accessible without such assistance. Exposure Time: Please see nurses notes. Contrast: Before injecting any contrast, we confirmed that the patient did not have an allergy to iodine, shellfish, or radiological contrast. Once  satisfactory needle placement was completed at the desired level, radiological contrast was injected. Contrast injected under live fluoroscopy. No contrast complications. See chart for type and volume of contrast used. Fluoroscopic Guidance: I was personally present during the use of fluoroscopy. "Tunnel Vision Technique" used to obtain the best possible view of the target area. Parallax error corrected before commencing the procedure. "Direction-depth-direction" technique used to introduce the needle under continuous pulsed fluoroscopy. Once target was reached, antero-posterior, oblique, and lateral fluoroscopic projection used confirm needle placement in all planes. Images permanently stored in EMR. Interpretation: I personally interpreted the imaging intraoperatively. Adequate needle placement confirmed in multiple planes. Appropriate spread of contrast into desired area was observed. No evidence of afferent or efferent intravascular uptake. Permanent images saved into the patient's record.  Antibiotic Prophylaxis:   Anti-infectives (From admission, onward)   None     Indication(s): None identified  Post-operative Assessment:  Post-procedure Vital Signs:  Pulse/HCG Rate: 6368 Temp: 97.9 F (36.6 C) Resp: 18 BP: (!) 158/86 SpO2: 95 %  EBL: None  Complications: No immediate post-treatment complications observed by team, or reported by patient.  Note: The patient tolerated the entire procedure well. A repeat set of vitals were taken after the procedure and the patient was kept under observation following institutional policy, for this type of procedure. Post-procedural neurological assessment was performed, showing return to baseline, prior to discharge. The patient was provided with post-procedure discharge instructions, including a section on how to identify potential problems. Should any problems arise concerning this procedure, the patient was given instructions to immediately contact  us, at any time, without hesitation. In any case, we plan to contact the patient by telephone for a follow-up status report regarding this interventional procedure.  Comments:  No additional relevant information.  Plan of Care  Orders:  Orders Placed This Encounter  Procedures  . SUPRASCAPULAR NERVE BLOCK    For shoulder pain.    Scheduling Instructions:     Purpose: Diagnostic     Laterality: Left-sided     Level(s): Suprascapular notch     Sedation: Patient's choice.     Scheduling Timeframe: Today  Order Specific Question:   Where will this procedure be performed?    Answer:   [facility]  . [facility] C-ARM 1-60 MIN NO REPORT    Intraoperative interpretation by procedural physician at [place].    Standing Status:   Standing    Number of Occurrences:   1    Order Specific Question:   Reason for exam:    Answer:   Assistance in needle guidance and placement for procedures requiring needle placement in or near specific anatomical locations not easily accessible without such assistance.  . Informed Consent Details: Physician/Practitioner Attestation; Transcribe to consent form and obtain patient signature    Nursing Order: Transcribe to consent form and obtain patient signature. Note: Always confirm laterality of pain with Howard Maxwell, before procedure. Procedure: Suprascapular nerve block Indication/Reason: Diagnosis and/for treatment of shoulder pain (arthralgia) secondary to shoulder joint problems (arthropathy). Provider Attestation: I, [place] Dossie Arbour, MD, (Pain Management Specialist), the physician/practitioner, attest that I have discussed with the patient the benefits, risks, side effects, alternatives, likelihood of achieving goals and potential problems during recovery for the procedure that I have provided informed consent.  . Provide equipment / supplies at bedside    Equipment required: Single use, disposable, "Block Tray"    Standing  Status:   Standing    Number of Occurrences:   1    Order Specific Question:   Specify    Answer:   Block Tray   Chronic Opioid Analgesic:  Oxycodone IR 5 mg 1 tablet p.o. every 6 hours (20 mg/day of oxycodone) MME/day: 30 mg/day.   Medications ordered for procedure: Meds ordered this encounter  Medications  . lidocaine (XYLOCAINE) 2 % (with pres) injection 400 mg  . methylPREDNISolone acetate (DEPO-MEDROL) injection 80 mg  . ropivacaine (PF) 2 mg/mL (0.2%) (NAROPIN) injection 9 mL   Medications administered: We administered lidocaine, methylPREDNISolone acetate, and ropivacaine (PF) 2 mg/mL (0.2%).  See the medical record for exact dosing, route, and time of administration.  Follow-up plan:   Return in about 2 weeks (around 12/14/2019) for (VV), (PP) Follow-up.       Interventional management options: Planned, scheduled, and/or pending: Not at this time.   Considering: Diagnostic bilateral lumbar facetblock  Possible bilateral lumbar facet RFA Diagnostic L3-4 interspinous ligament block Possible L3-4 bilateral medial branch RFA Diagnostic right sided L2-3 and L3-4 lumbar epiduralsteroid injection  Diagnostic bilateral L1-2 &L4-5 transforaminalepidural steroid injection  Diagnostic bilateral L2-3 transforaminal epidural steroid injection Diagnostic caudal epidural steroid injection + diagnostic epidurogram Possible Racz procedure Diagnostic bilateral intra-articular hipjoint injection  Diagnostic bilateral femoral nerve and obturator nerveblock  Possible bilateral femoral nerve and obturator nerve RFA Diagnostic left cervical epidural steroid injection  Diagnostic bilateral cervical facet block Possible bilateral cervical facet RFA Diagnostic left Genicular nerve block Possible left Genicular nerve radiofrequencyablation  Possible intrathecal opioid trial   Palliative PRN treatment(s): Palliative left IA Hyalgan knee injection S2/N1  Therapeutic  bilateral L2 TFESI #2  Therapeutic right-sided L3-4 LESI #2  Palliative right IA hip joint injection #2 (100/100/90/90) (last done 09/07/2019)    Recent Visits Date Type Provider Dept  11/28/19 Office Visit Milinda Pointer, MD [facility]  09/26/19 Telemedicine Milinda Pointer, MD [facility]  09/07/19 Procedure visit Milinda Pointer, MD [facility]  Showing recent visits within past 90 days and meeting all other requirements Today's Visits Date Type Provider Dept  11/30/19 Procedure visit Milinda Pointer, MD [facility]  Showing today's  visits and meeting all other requirements Future Appointments Date Type Provider Dept  12/27/19 Appointment Milinda Pointer, MD [facility]  Showing future appointments within next 90 days and meeting all other requirements  Disposition: Discharge home  Discharge (Date  Time): 11/30/2019; 0855 hrs.   Primary Care Physician: Tracie Harrier, MD Location: [facility] Note by: Gaspar Cola, MD Date: 11/30/2019; Time: 9:01 AM  Disclaimer:  Medicine is not an Chief Strategy Officer. The only guarantee in medicine is that nothing is guaranteed. It is important to note that the decision to proceed with this intervention was based on the information collected from the patient. The Data and conclusions were drawn from the patient's questionnaire, the interview, and the physical examination. Because the information was provided in large part by the patient, it cannot be guaranteed that it has not been purposely or unconsciously manipulated. Every effort has been made to obtain as much relevant data as possible for this evaluation. It is important to note that the conclusions that lead to this procedure are derived in large part from the available data. Always take into account that the treatment will also be dependent on availability of resources and existing treatment  guidelines, considered by other Pain Management Practitioners as being common knowledge and practice, at the time of the intervention. For Medico-Legal purposes, it is also important to point out that variation in procedural techniques and pharmacological choices are the acceptable norm. The indications, contraindications, technique, and results of the above procedure should only be interpreted and judged by a Board-Certified Interventional Pain Specialist with extensive familiarity and expertise in the same exact procedure and technique.

## 2019-11-29 NOTE — Patient Instructions (Addendum)
____________________________________________________________________________________________  Post-Procedure Discharge Instructions  Instructions:  Apply ice:   Purpose: This will minimize any swelling and discomfort after procedure.   When: Day of procedure, as soon as you get home.  How: Fill a plastic sandwich bag with crushed ice. Cover it with a small towel and apply to injection site.  How long: (15 min on, 15 min off) Apply for 15 minutes then remove x 15 minutes.  Repeat sequence on day of procedure, until you go to bed.  Apply heat:   Purpose: To treat any soreness and discomfort from the procedure.  When: Starting the next day after the procedure.  How: Apply heat to procedure site starting the day following the procedure.  How long: May continue to repeat daily, until discomfort goes away.  Food intake: Start with clear liquids (like water) and advance to regular food, as tolerated.   Physical activities: Keep activities to a minimum for the first 8 hours after the procedure. After that, then as tolerated.  Driving: If you have received any sedation, be responsible and do not drive. You are not allowed to drive for 24 hours after having sedation.  Blood thinner: (Applies only to those taking blood thinners) You may restart your blood thinner 6 hours after your procedure.  Insulin: (Applies only to Diabetic patients taking insulin) As soon as you can eat, you may resume your normal dosing schedule.  Infection prevention: Keep procedure site clean and dry. Shower daily and clean area with soap and water.  Post-procedure Pain Diary: Extremely important that this be done correctly and accurately. Recorded information will be used to determine the next step in treatment. For the purpose of accuracy, follow these rules:  Evaluate only the area treated. Do not report or include pain from an untreated area. For the purpose of this evaluation, ignore all other areas of pain,  except for the treated area.  After your procedure, avoid taking a long nap and attempting to complete the pain diary after you wake up. Instead, set your alarm clock to go off every hour, on the hour, for the initial 8 hours after the procedure. Document the duration of the numbing medicine, and the relief you are getting from it.  Do not go to sleep and attempt to complete it later. It will not be accurate. If you received sedation, it is likely that you were given a medication that may cause amnesia. Because of this, completing the diary at a later time may cause the information to be inaccurate. This information is needed to plan your care.  Follow-up appointment: Keep your post-procedure follow-up evaluation appointment after the procedure (usually 2 weeks for most procedures, 6 weeks for radiofrequencies). DO NOT FORGET to bring you pain diary with you.   Expect: (What should I expect to see with my procedure?)  From numbing medicine (AKA: Local Anesthetics): Numbness or decrease in pain. You may also experience some weakness, which if present, could last for the duration of the local anesthetic.  Onset: Full effect within 15 minutes of injected.  Duration: It will depend on the type of local anesthetic used. On the average, 1 to 8 hours.   From steroids (Applies only if steroids were used): Decrease in swelling or inflammation. Once inflammation is improved, relief of the pain will follow.  Onset of benefits: Depends on the amount of swelling present. The more swelling, the longer it will take for the benefits to be seen. In some cases, up to 10 days.  Duration: Steroids will stay in the system x 2 weeks. Duration of benefits will depend on multiple posibilities including persistent irritating factors.  Side-effects: If present, they may typically last 2 weeks (the duration of the steroids).  Frequent: Cramps (if they occur, drink Gatorade and take over-the-counter Magnesium 450-500 mg  once to twice a day); water retention with temporary weight gain; increases in blood sugar; decreased immune system response; increased appetite.  Occasional: Facial flushing (red, warm cheeks); mood swings; menstrual changes.  Uncommon: Long-term decrease or suppression of natural hormones; bone thinning. (These are more common with higher doses or more frequent use. This is why we prefer that our patients avoid having any injection therapies in other practices.)   Very Rare: Severe mood changes; psychosis; aseptic necrosis.  From procedure: Some discomfort is to be expected once the numbing medicine wears off. This should be minimal if ice and heat are applied as instructed.  Call if: (When should I call?)  You experience numbness and weakness that gets worse with time, as opposed to wearing off.  New onset bowel or bladder incontinence. (Applies only to procedures done in the spine)  Emergency Numbers:  Durning business hours (Monday - Thursday, 8:00 AM - 4:00 PM) (Friday, 9:00 AM - 12:00 Noon): [phone]  After hours: [phone]  NOTE: If you are having a problem and are unable connect with, or to talk to a provider, then go to your nearest urgent care or emergency department. If the problem is serious and urgent, please call 911. ____________________________________________________________________________________________   Pain Management Discharge Instructions  General Discharge Instructions :  If you need to reach your doctor call: Monday-Friday 8:00 am - 4:00 pm at [phone] or toll free [phone].  After clinic hours [phone] to have operator reach doctor.  Bring all of your medication bottles to all your appointments in the pain clinic.  To cancel or reschedule your appointment with Pain Management please remember to call 24 hours in advance to avoid a fee.  Refer to the educational materials which you have been given on: General Risks, I had my  Procedure. Discharge Instructions, Post Sedation.  Post Procedure Instructions:  The drugs you were given will stay in your system until tomorrow, so for the next 24 hours you should not drive, make any legal decisions or drink any alcoholic beverages.  You may eat anything you prefer, but it is better to start with liquids then soups and crackers, and gradually work up to solid foods.  Please notify your doctor immediately if you have any unusual bleeding, trouble breathing or pain that is not related to your normal pain.  Depending on the type of procedure that was done, some parts of your body may feel week and/or numb.  This usually clears up by tonight or the next day.  Walk with the use of an assistive device or accompanied by an adult for the 24 hours.  You may use ice on the affected area for the first 24 hours.  Put ice in a Ziploc bag and cover with a towel and place against area 15 minutes on 15 minutes off.  You may switch to heat after 24 hours.

## 2019-11-29 NOTE — Progress Notes (Signed)
He presents today for follow-up of his ulcer subfourth metatarsal head left foot.  States that his best that is ever looked.  Objective: Vital signs are stable he is alert oriented x3 there is no erythema edema cellulitis drainage odor reactive hyperkeratotic tissue was debrided demonstrates no open lesions at all.  Assessment: Debridement diabetic ulceration left foot.  Plan: At this point I debrided the wound today and I will follow-up with him in the near future for further debridement.  Eventually the goal is to get him shoes that he is able to wear that will help offload as well as follow-up with him in 6 months

## 2019-11-30 ENCOUNTER — Ambulatory Visit
Admission: RE | Admit: 2019-11-30 | Discharge: 2019-11-30 | Disposition: A | Discharge status: Home / Self Care | Payer: Medicare Other | Source: Ambulatory Visit | Attending: Pain Medicine | Admitting: Pain Medicine

## 2019-11-30 ENCOUNTER — Ambulatory Visit (HOSPITAL_BASED_OUTPATIENT_CLINIC_OR_DEPARTMENT_OTHER): Payer: Medicare Other | Admitting: Pain Medicine

## 2019-11-30 ENCOUNTER — Encounter: Payer: Self-pay | Admitting: Pain Medicine

## 2019-11-30 VITALS — BP 158/86 | HR 63 | Temp 97.9°F | Resp 18 | Ht 70.0 in | Wt 240.0 lb

## 2019-11-30 DIAGNOSIS — G8929 Other chronic pain: Secondary | ICD-10-CM | POA: Diagnosis not present

## 2019-11-30 DIAGNOSIS — M25512 Pain in left shoulder: Secondary | ICD-10-CM | POA: Diagnosis not present

## 2019-11-30 MED ORDER — ROPIVACAINE HCL 2 MG/ML IJ SOLN
9.0000 mL | Freq: Once | INTRAMUSCULAR | Status: AC
  Administered 2019-11-30: 9 mL via INTRA_ARTICULAR
  Filled 2019-11-30: qty 10

## 2019-11-30 MED ORDER — METHYLPREDNISOLONE ACETATE 80 MG/ML IJ SUSP
80.0000 mg | Freq: Once | INTRAMUSCULAR | Status: AC
  Administered 2019-11-30: 80 mg via INTRA_ARTICULAR
  Filled 2019-11-30: qty 1

## 2019-11-30 MED ORDER — LIDOCAINE HCL 2 % IJ SOLN
20.0000 mL | Freq: Once | INTRAMUSCULAR | Status: AC
  Administered 2019-11-30: 400 mg

## 2019-11-30 NOTE — Progress Notes (Signed)
Safety precautions to be maintained throughout the outpatient stay will include: orient to surroundings, keep bed in low position, maintain call bell within reach at all times, provide assistance with transfer out of bed and ambulation.  

## 2019-12-01 ENCOUNTER — Telehealth: Payer: Self-pay

## 2019-12-01 NOTE — Telephone Encounter (Signed)
Post procedure phone call.  LM 

## 2019-12-26 ENCOUNTER — Other Ambulatory Visit: Payer: Self-pay

## 2019-12-26 ENCOUNTER — Telehealth: Payer: Self-pay | Admitting: *Deleted

## 2019-12-26 ENCOUNTER — Encounter: Payer: Self-pay | Admitting: Physician Assistant

## 2019-12-26 ENCOUNTER — Ambulatory Visit: Payer: Medicare Other | Admitting: Physician Assistant

## 2019-12-26 VITALS — BP 116/70 | HR 68 | Ht 70.0 in | Wt 239.4 lb

## 2019-12-26 DIAGNOSIS — I471 Supraventricular tachycardia: Secondary | ICD-10-CM

## 2019-12-26 DIAGNOSIS — I10 Essential (primary) hypertension: Secondary | ICD-10-CM | POA: Diagnosis not present

## 2019-12-26 DIAGNOSIS — Z720 Tobacco use: Secondary | ICD-10-CM

## 2019-12-26 DIAGNOSIS — I739 Peripheral vascular disease, unspecified: Secondary | ICD-10-CM | POA: Diagnosis not present

## 2019-12-26 DIAGNOSIS — G4730 Sleep apnea, unspecified: Secondary | ICD-10-CM

## 2019-12-26 DIAGNOSIS — I2510 Atherosclerotic heart disease of native coronary artery without angina pectoris: Secondary | ICD-10-CM
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
## 2019-12-26 NOTE — Telephone Encounter (Signed)
Called  For pre-visit assessment. No answer. LVM.
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
## 2019-12-26 NOTE — Patient Instructions (Signed)
Medication Instructions:   1. Your physician recommends that you continue on your current medications as directed. Please refer to the Current Medication list given to you today.  *If you need a refill on your cardiac medications before your next appointment, please call your pharmacy*   Lab Work:  1. None ordered  If you have labs (blood work) drawn today and your tests are completely normal, you will receive your results only by: Marland Kitchen MyChart Message (if you have MyChart) OR . A paper copy in the mail If you have any lab test that is abnormal or we need to change your treatment, we will call you to review the results.   Testing/Procedures:  1. None Ordered  Follow-Up: At [facility], you and your health needs are our priority.  As part of our continuing mission to provide you with exceptional heart care, we have created designated Provider Care Teams.  These Care Teams include your primary Cardiologist (physician) and Advanced Practice Providers (APPs -  Physician Assistants and Nurse Practitioners) who all work together to provide you with the care you need, when you need it.  We recommend signing up for the patient portal called "MyChart".  Sign up information is provided on this After Visit Summary.  MyChart is used to connect with patients for Virtual Visits (Telemedicine).  Patients are able to view lab/test results, encounter notes, upcoming appointments, etc.  Non-urgent messages can be sent to your provider as well.   To learn more about what you can do with MyChart, go to [email or web address].    Your next appointment:   6 month(s)  The format for your next appointment:   In Person  Provider:   Kathlyn Sacramento, MD

## 2019-12-27 ENCOUNTER — Encounter: Payer: Self-pay | Admitting: Pain Medicine

## 2019-12-27 ENCOUNTER — Ambulatory Visit: Payer: Medicare Other | Attending: Pain Medicine | Admitting: Pain Medicine

## 2019-12-27 DIAGNOSIS — M4802 Spinal stenosis, cervical region: Secondary | ICD-10-CM | POA: Diagnosis not present

## 2019-12-27 DIAGNOSIS — M5412 Radiculopathy, cervical region: Secondary | ICD-10-CM

## 2019-12-27 DIAGNOSIS — M542 Cervicalgia: Secondary | ICD-10-CM

## 2019-12-27 DIAGNOSIS — M25512 Pain in left shoulder: Secondary | ICD-10-CM

## 2019-12-27 DIAGNOSIS — G8929 Other chronic pain: Secondary | ICD-10-CM

## 2020-01-23 DIAGNOSIS — J329 Chronic sinusitis, unspecified: Secondary | ICD-10-CM | POA: Insufficient documentation

## 2020-01-23 DIAGNOSIS — G4733 Obstructive sleep apnea (adult) (pediatric): Secondary | ICD-10-CM | POA: Insufficient documentation

## 2020-01-24 ENCOUNTER — Other Ambulatory Visit: Payer: Self-pay

## 2020-01-24 ENCOUNTER — Ambulatory Visit: Payer: Medicare Other | Admitting: Urology

## 2020-01-24 ENCOUNTER — Encounter: Payer: Self-pay | Admitting: Urology

## 2020-01-24 VITALS — BP 125/74 | HR 65 | Ht 70.0 in | Wt 239.0 lb

## 2020-01-24 DIAGNOSIS — R3915 Urgency of urination: Secondary | ICD-10-CM | POA: Diagnosis not present

## 2020-01-24 DIAGNOSIS — N3281 Overactive bladder: Secondary | ICD-10-CM

## 2020-01-24 LAB — BLADDER SCAN AMB NON-IMAGING

## 2020-01-24 MED ORDER — MIRABEGRON ER 50 MG PO TB24: 50.0000 mg | ORAL_TABLET | Freq: Every day | ORAL | 3 refills | Status: DC

## 2020-01-24 NOTE — Patient Instructions (Signed)
Urgent PC office-based treatment for overactive bladder Take back control of your life! Urgent PC is a non-drug, non-surgical option for overactive bladder and associated symptoms of urinary urgency, urinary frequency and urge incontinence  Urgent-PC- Since 2003, healthcare professionals have used the Urgent PC Neuromodulation System as an effective office treatment for men and women suffering from overactive bladder, a condition commonly referred to as OAB. Urgent PC is up to 80% effective, even after conservative measures and OAB drugs have failed. Plus, Urgent PC is very low risk, making it a great choice for people unable or unwilling to have more invasive procedures.  How does Urgent PC work?  The Urgent PC system delivers a specific type of neuromodulation called percutaneous tibial nerve stimulation (PTNS). During treatment, a small, slim needle electrode is inserted near your ankle. The needle electrode is then connected to the battery-powered stimulator. During your 30-minute treatment, mild impulses from the stimulator travel through the needle electrode, along your leg and to the nerves in your pelvis that control bladder function. This process is also referred to as neuromodulation.  What will I feel with Urgent PC therapy?  Because patients may experience the sensation of the Urgent PC therapy in different ways, it's difficult to say what the treatment would feel like to you. Patients often describe the sensation as "tingling" or "pulsating." Treatment is typically well-tolerated by patients. Urgent PC offers many different levels of stimulation, so your clinician will be able to adjust treatment to suit you as well as address any discomfort that you might experience during treatment.  How often will I need Urgent PC treatments? You will receive an initial series of 12 treatments scheduled about a week apart. If you respond, you will likely need a treatment about once per month to  maintain your improvements.  How soon will I see results with Urgent PC? Because Urgent PC gently modifies the signals to achieve bladder control, it usually takes 5-7 weeks for symptoms to change. However, patients respond at different rates. In a review of about 100 patients who had success with Urgent PC, symptoms improved anywhere between 2-12 weeks. For about 20% of these patients, the symptoms of urgency and/or urge incontinence didn't improve until after 8 weeks.1  There is no way to anticipate who will respond earlier, later or not at all. That's why it is important to receive the 12 recommended treatments before you and your physician evaluate whether this therapy is an appropriate and effective choice for you.  How can I receive treatment with Urgent PC? Urgent PC is an option for patients with OAB. If you think you have OAB, talk to your doctor, a urologist or urogynecologist. If you have OAB, the doctor will work with you to determine your own personal treatment plan which usually starts with behavior and diet modifications plus medications. Urgent PC is an excellent option if these options don't work or provide sufficient improvements. Treatment with Urgent PC is typically performed at the office of a urologist, urogynecologist or gynecologist. So, if you run out of options with your normal doctor, consider visiting one of these specialists.  Does insurance cover Urgent PC? Urgent PC treatment is reimbursed by Medicare across the [place]. Private insurance coverage varies by state. To see if your insurance company covers Urgent PC, use our Coverage Finder or talk to your Healthcare Provider.  Are there patients who should not be treated with Urgent PC? Yes, these include: patients with pacemakers or implantable defibrillators, patients  prone to excessive bleeding, patients with nerve damage that could impact either percutaneous tibial nerve or pelvic floor function and patients who  are pregnant or planning to become pregnant during the duration of the treatment.  What are the risks associated with Urgent PC? The risks associated with Urgent PC therapy are low. Most common side-effects are temporary and include mild pain or skin inflammation at or near the stimulation site.    Overactive Bladder, Adult  Overactive bladder refers to a condition in which a person has a sudden need to pass urine. The person may leak urine if he or she cannot get to the bathroom fast enough (urinary incontinence). A person with this condition may also wake up several times in the night to go to the bathroom. Overactive bladder is associated with poor nerve signals between your bladder and your brain. Your bladder may get the signal to empty before it is full. You may also have very sensitive muscles that make your bladder squeeze too soon. These symptoms might interfere with daily work or social activities. What are the causes? This condition may be associated with or caused by:  Urinary tract infection.  Infection of nearby tissues, such as the prostate.  Prostate enlargement.  Surgery on the uterus or urethra.  Bladder stones, inflammation, or tumors.  Drinking too much caffeine or alcohol.  Certain medicines, especially medicines that get rid of extra fluid in the body (diuretics).  Muscle or nerve weakness, especially from: ? A spinal cord injury. ? Stroke. ? Multiple sclerosis. ? Parkinson's disease.  Diabetes.  Constipation. What increases the risk? You may be at greater risk for overactive bladder if you:  Are an older adult.  Smoke.  Are going through menopause.  Have prostate problems.  Have a neurological disease, such as stroke, dementia, Parkinson's disease, or multiple sclerosis (MS).  Eat or drink things that irritate the bladder. These include alcohol, spicy food, and caffeine.  Are overweight or obese. What are the signs or symptoms? Symptoms of  this condition include:  Sudden, strong urge to urinate.  Leaking urine.  Urinating 8 or more times a day.  Waking up to urinate 2 or more times a night. How is this diagnosed? Your health care provider may suspect overactive bladder based on your symptoms. He or she will diagnose this condition by:  A physical exam and medical history.  Blood or urine tests. You might need bladder or urine tests to help determine what is causing your overactive bladder. You might also need to see a health care provider who specializes in urinary tract problems (urologist). How is this treated? Treatment for overactive bladder depends on the cause of your condition and whether it is mild or severe. You can also make lifestyle changes at home. Options include:  Bladder training. This may include: ? Learning to control the urge to urinate by following a schedule that directs you to urinate at regular intervals (timed voiding). ? Doing Kegel exercises to strengthen your pelvic floor muscles, which support your bladder. Toning these muscles can help you control urination, even if your bladder muscles are overactive.  Special devices. This may include: ? Biofeedback, which uses sensors to help you become aware of your body's signals. ? Electrical stimulation, which uses electrodes placed inside the body (implanted) or outside the body. These electrodes send gentle pulses of electricity to strengthen the nerves or muscles that control the bladder. ? Women may use a plastic device that fits into the vagina  and supports the bladder (pessary).  Medicines. ? Antibiotics to treat bladder infection. ? Antispasmodics to stop the bladder from releasing urine at the wrong time. ? Tricyclic antidepressants to relax bladder muscles. ? Injections of botulinum toxin type A directly into the bladder tissue to relax bladder muscles.  Lifestyle changes. This may include: ? Weight loss. Talk to your health care provider  about weight loss methods that would work best for you. ? Diet changes. This may include reducing how much alcohol and caffeine you consume, or drinking fluids at different times of the day. ? Not smoking. Do not use any products that contain nicotine or tobacco, such as cigarettes and e-cigarettes. If you need help quitting, ask your health care provider.  Surgery. ? A device may be implanted to help manage the nerve signals that control urination. ? An electrode may be implanted to stimulate electrical signals in the bladder. ? A procedure may be done to change the shape of the bladder. This is done only in very severe cases. Follow these instructions at home: Lifestyle  Make any diet or lifestyle changes that are recommended by your health care provider. These may include: ? Drinking less fluid or drinking fluids at different times of the day. ? Cutting down on caffeine or alcohol. ? Doing Kegel exercises. ? Losing weight if needed. ? Eating a healthy and balanced diet to prevent constipation. This may include:  Eating foods that are high in fiber, such as fresh fruits and vegetables, whole grains, and beans.  Limiting foods that are high in fat and processed sugars, such as fried and sweet foods. General instructions  Take over-the-counter and prescription medicines only as told by your health care provider.  If you were prescribed an antibiotic medicine, take it as told by your health care provider. Do not stop taking the antibiotic even if you start to feel better.  Use any implants or pessary as told by your health care provider.  If needed, wear pads to absorb urine leakage.  Keep a journal or log to track how much and when you drink and when you feel the need to urinate. This will help your health care provider monitor your condition.  Keep all follow-up visits as told by your health care provider. This is important. Contact a health care provider if:  You have a  fever.  Your symptoms do not get better with treatment.  Your pain and discomfort get worse.  You have more frequent urges to urinate. Get help right away if:  You are not able to control your bladder. Summary  Overactive bladder refers to a condition in which a person has a sudden need to pass urine.  Several conditions may lead to an overactive bladder.  Treatment for overactive bladder depends on the cause and severity of your condition.  Follow your health care provider's instructions about lifestyle changes, doing Kegel exercises, keeping a journal, and taking medicines. This information is not intended to replace advice given to you by your health care provider. Make sure you discuss any questions you have with your health care provider. Document Revised: 06/30/2018 Document Reviewed: 03/25/2017 Elsevier Patient Education  [place].

## 2020-01-24 NOTE — Progress Notes (Signed)
   01/24/2020 10:12 AM   Howard Maxwell 03/23/1946 [id number]  Reason for visit: Follow up overactive bladder  HPI: I saw Howard Maxwell back in urology clinic for follow-up of overactive bladder.  He is a 74 year old male with chronic back pain and multiple back surgeries who has a long history of urinary urgency, frequency, and urge incontinence.  When I first met him, he was voiding every 30 minutes during the day, and every 1-2 hours overnight, as well as having episodes of urge incontinence on trips.  He had a negative hematuria work-up in 2005 and has an extensive smoking history, as well as poorly controlled diabetes, and sleep apnea not on CPAP.  He is currently on Myrbetriq 50 mg daily which significantly improves his urinary symptoms.  Secondary to the cost of the medication, he is really only taking it as needed when he goes out of his house or on long trips, but when he takes that he feels like it makes a significant difference in his symptoms.  He denies any gross hematuria or dysuria.  His primary complaint is urgency.  He also continues to drink coffee and tea during the day, which likely contribute to his urinary symptoms.  IPSS score today is 17 with quality of life mostly dissatisfied.  We discussed that overactive bladder (OAB) is not a disease, but is a symptom complex that is generally not life-threatening.  Symptoms typically include urinary urgency, frequency, and urge incontinence.  There are numerous treatment options, however there are risks and benefits with both medical and surgical management.  First-line treatment is behavioral therapies including bladder training, pelvic floor muscle training, and fluid management.  Second line treatments include oral antimuscarinics(Ditropan er, Trospium) and beta-3 agonist (Mybetriq). There is typically a period of medication trial (4-8 weeks) to find the optimal therapy and dosing. If symptoms are bothersome despite the above management,  third line options include intra-detrusor botox, peripheral tibial nerve stimulation (PTNS), and interstim (SNS). These are more invasive treatments with higher side effect profile, but may improve quality of life for patients with severe OAB symptoms.   He is interested in considering PTNS in the future, but has a busy schedule coming up and will let us know if he would like to schedule PTNS  Myrbetriq refilled, samples given RTC 1 year symptom check Okay to schedule PTNS if patient desires   Billey Co, MD  [facility] [address] [place], [place] [address] [phone]

## 2020-01-25 ENCOUNTER — Other Ambulatory Visit (HOSPITAL_BASED_OUTPATIENT_CLINIC_OR_DEPARTMENT_OTHER): Payer: Self-pay

## 2020-01-25 DIAGNOSIS — R0683 Snoring: Secondary | ICD-10-CM

## 2020-01-25 DIAGNOSIS — R0681 Apnea, not elsewhere classified: Secondary | ICD-10-CM

## 2020-01-27 ENCOUNTER — Other Ambulatory Visit: Payer: Self-pay | Admitting: Cardiovascular Disease

## 2020-01-29 ENCOUNTER — Encounter: Payer: Self-pay | Admitting: Podiatry

## 2020-01-29 ENCOUNTER — Ambulatory Visit: Payer: Medicare Other | Admitting: Podiatry

## 2020-01-29 ENCOUNTER — Other Ambulatory Visit: Payer: Self-pay

## 2020-01-29 DIAGNOSIS — Q828 Other specified congenital malformations of skin: Secondary | ICD-10-CM

## 2020-01-29 NOTE — Progress Notes (Signed)
He presents today for follow-up of ulceration plantar aspect of his left foot.  He states that it seems to be doing pretty well has not been open or bleeding lately.  States that he went to [place] and did a lot of walking and a lot of leaf watching and he states that he put several miles on his feet and have had no problems with them.  Objective: Vital signs are stable alert oriented x3 pulses remain palpable evaluation of the plantar aspect of the left foot demonstrates a subfourth lesion that is hyperkeratotic once debrided does demonstrate some pinpoint bleeding but that is all.  There is no purulence no malodor.  Assessment: Well-healing ulceration left foot.  Plan: Debrided the area today placed silver nitrate for the pinpoint bleeding.  I will follow-up with him in 3 months.  We will continue to extend this a month as long as he is doing well he will also follow-up with Liliane Channel at that time for diabetic shoes.

## 2020-02-01 ENCOUNTER — Other Ambulatory Visit: Payer: Self-pay | Admitting: Pain Medicine

## 2020-02-01 ENCOUNTER — Telehealth: Payer: Self-pay | Admitting: Pain Medicine

## 2020-02-01 DIAGNOSIS — M4802 Spinal stenosis, cervical region: Secondary | ICD-10-CM

## 2020-02-01 DIAGNOSIS — M542 Cervicalgia: Secondary | ICD-10-CM | POA: Insufficient documentation

## 2020-02-01 DIAGNOSIS — M5412 Radiculopathy, cervical region: Secondary | ICD-10-CM

## 2020-02-01 DIAGNOSIS — M5030 Other cervical disc degeneration, unspecified cervical region: Secondary | ICD-10-CM | POA: Insufficient documentation

## 2020-02-01 NOTE — Telephone Encounter (Signed)
Message sent to dr Dossie Arbour.

## 2020-02-01 NOTE — Telephone Encounter (Signed)
Dr Dossie Arbour put the order in.  Please schedule.

## 2020-02-01 NOTE — Telephone Encounter (Signed)
Patient wants to go ahead with the epidural. I see mention of it in the last discharge but we have no order. He wanted to possible have it next week. Do I need to schedule him for eval first or can I schedule for procedure next week.

## 2020-02-06 ENCOUNTER — Encounter: Payer: Self-pay | Admitting: Pain Medicine

## 2020-02-06 ENCOUNTER — Ambulatory Visit (HOSPITAL_BASED_OUTPATIENT_CLINIC_OR_DEPARTMENT_OTHER): Payer: Medicare Other | Admitting: Pain Medicine

## 2020-02-06 ENCOUNTER — Ambulatory Visit
Admission: RE | Admit: 2020-02-06 | Discharge: 2020-02-06 | Disposition: A | Discharge status: Home / Self Care | Payer: Medicare Other | Source: Ambulatory Visit | Attending: Pain Medicine | Admitting: Pain Medicine

## 2020-02-06 ENCOUNTER — Other Ambulatory Visit: Payer: Self-pay

## 2020-02-06 VITALS — BP 156/92 | HR 77 | Temp 97.3°F | Resp 16 | Ht 70.0 in | Wt 240.0 lb

## 2020-02-06 DIAGNOSIS — Z9889 Other specified postprocedural states: Secondary | ICD-10-CM | POA: Insufficient documentation

## 2020-02-06 DIAGNOSIS — M79601 Pain in right arm: Secondary | ICD-10-CM | POA: Insufficient documentation

## 2020-02-06 DIAGNOSIS — G8929 Other chronic pain: Secondary | ICD-10-CM

## 2020-02-06 DIAGNOSIS — M79602 Pain in left arm: Secondary | ICD-10-CM | POA: Diagnosis present

## 2020-02-06 DIAGNOSIS — R937 Abnormal findings on diagnostic imaging of other parts of musculoskeletal system: Secondary | ICD-10-CM | POA: Diagnosis present

## 2020-02-06 DIAGNOSIS — M5030 Other cervical disc degeneration, unspecified cervical region: Secondary | ICD-10-CM | POA: Diagnosis present

## 2020-02-06 DIAGNOSIS — M5412 Radiculopathy, cervical region: Secondary | ICD-10-CM

## 2020-02-06 DIAGNOSIS — M542 Cervicalgia: Secondary | ICD-10-CM | POA: Diagnosis present

## 2020-02-06 DIAGNOSIS — M4802 Spinal stenosis, cervical region: Secondary | ICD-10-CM | POA: Insufficient documentation

## 2020-02-06 MED ORDER — IOHEXOL 180 MG/ML  SOLN
10.0000 mL | Freq: Once | INTRAMUSCULAR | Status: AC
  Administered 2020-02-06: 10 mL via EPIDURAL

## 2020-02-06 MED ORDER — ROPIVACAINE HCL 2 MG/ML IJ SOLN
1.0000 mL | Freq: Once | INTRAMUSCULAR | Status: AC
  Administered 2020-02-06: 1 mL via EPIDURAL
  Filled 2020-02-06: qty 10

## 2020-02-06 MED ORDER — MIDAZOLAM HCL 5 MG/5ML IJ SOLN: 1.0000 mg | INTRAMUSCULAR | Status: DC | PRN

## 2020-02-06 MED ORDER — SODIUM CHLORIDE 0.9% FLUSH
1.0000 mL | Freq: Once | INTRAVENOUS | Status: AC
  Administered 2020-02-06: 1 mL

## 2020-02-06 MED ORDER — LIDOCAINE HCL 2 % IJ SOLN
20.0000 mL | Freq: Once | INTRAMUSCULAR | Status: AC
  Administered 2020-02-06: 400 mg
  Filled 2020-02-06: qty 20

## 2020-02-06 MED ORDER — DEXAMETHASONE SODIUM PHOSPHATE 10 MG/ML IJ SOLN
10.0000 mg | Freq: Once | INTRAMUSCULAR | Status: AC
  Administered 2020-02-06: 10 mg
  Filled 2020-02-06: qty 1

## 2020-02-06 MED ORDER — FENTANYL CITRATE (PF) 100 MCG/2ML IJ SOLN: 25.0000 ug | INTRAMUSCULAR | Status: DC | PRN

## 2020-02-06 MED ORDER — SODIUM CHLORIDE (PF) 0.9 % IJ SOLN
INTRAMUSCULAR | Status: AC
  Filled 2020-02-06: qty 10

## 2020-02-06 MED ORDER — LACTATED RINGERS IV SOLN: 1000.0000 mL | Freq: Once | INTRAVENOUS | Status: DC

## 2020-02-06 NOTE — Patient Instructions (Addendum)
____________________________________________________________________________________________  Post-Procedure Discharge Instructions  Instructions:  Apply ice:   Purpose: This will minimize any swelling and discomfort after procedure.   When: Day of procedure, as soon as you get home.  How: Fill a plastic sandwich bag with crushed ice. Cover it with a small towel and apply to injection site.  How long: (15 min on, 15 min off) Apply for 15 minutes then remove x 15 minutes.  Repeat sequence on day of procedure, until you go to bed.  Apply heat:   Purpose: To treat any soreness and discomfort from the procedure.  When: Starting the next day after the procedure.  How: Apply heat to procedure site starting the day following the procedure.  How long: May continue to repeat daily, until discomfort goes away.  Food intake: Start with clear liquids (like water) and advance to regular food, as tolerated.   Physical activities: Keep activities to a minimum for the first 8 hours after the procedure. After that, then as tolerated.  Driving: If you have received any sedation, be responsible and do not drive. You are not allowed to drive for 24 hours after having sedation.  Blood thinner: (Applies only to those taking blood thinners) You may restart your blood thinner 6 hours after your procedure.  Insulin: (Applies only to Diabetic patients taking insulin) As soon as you can eat, you may resume your normal dosing schedule.  Infection prevention: Keep procedure site clean and dry. Shower daily and clean area with soap and water.  Post-procedure Pain Diary: Extremely important that this be done correctly and accurately. Recorded information will be used to determine the next step in treatment. For the purpose of accuracy, follow these rules:  Evaluate only the area treated. Do not report or include pain from an untreated area. For the purpose of this evaluation, ignore all other areas of pain,  except for the treated area.  After your procedure, avoid taking a long nap and attempting to complete the pain diary after you wake up. Instead, set your alarm clock to go off every hour, on the hour, for the initial 8 hours after the procedure. Document the duration of the numbing medicine, and the relief you are getting from it.  Do not go to sleep and attempt to complete it later. It will not be accurate. If you received sedation, it is likely that you were given a medication that may cause amnesia. Because of this, completing the diary at a later time may cause the information to be inaccurate. This information is needed to plan your care.  Follow-up appointment: Keep your post-procedure follow-up evaluation appointment after the procedure (usually 2 weeks for most procedures, 6 weeks for radiofrequencies). DO NOT FORGET to bring you pain diary with you.   Expect: (What should I expect to see with my procedure?)  From numbing medicine (AKA: Local Anesthetics): Numbness or decrease in pain. You may also experience some weakness, which if present, could last for the duration of the local anesthetic.  Onset: Full effect within 15 minutes of injected.  Duration: It will depend on the type of local anesthetic used. On the average, 1 to 8 hours.   From steroids (Applies only if steroids were used): Decrease in swelling or inflammation. Once inflammation is improved, relief of the pain will follow.  Onset of benefits: Depends on the amount of swelling present. The more swelling, the longer it will take for the benefits to be seen. In some cases, up to 10 days.  Duration: Steroids will stay in the system x 2 weeks. Duration of benefits will depend on multiple posibilities including persistent irritating factors.  Side-effects: If present, they may typically last 2 weeks (the duration of the steroids).  Frequent: Cramps (if they occur, drink Gatorade and take over-the-counter Magnesium 450-500 mg  once to twice a day); water retention with temporary weight gain; increases in blood sugar; decreased immune system response; increased appetite.  Occasional: Facial flushing (red, warm cheeks); mood swings; menstrual changes.  Uncommon: Long-term decrease or suppression of natural hormones; bone thinning. (These are more common with higher doses or more frequent use. This is why we prefer that our patients avoid having any injection therapies in other practices.)   Very Rare: Severe mood changes; psychosis; aseptic necrosis.  From procedure: Some discomfort is to be expected once the numbing medicine wears off. This should be minimal if ice and heat are applied as instructed.  Call if: (When should I call?)  You experience numbness and weakness that gets worse with time, as opposed to wearing off.  New onset bowel or bladder incontinence. (Applies only to procedures done in the spine)  Emergency Numbers:  Durning business hours (Monday - Thursday, 8:00 AM - 4:00 PM) (Friday, 9:00 AM - 12:00 Noon): [phone]  After hours: [phone]  NOTE: If you are having a problem and are unable connect with, or to talk to a provider, then go to your nearest urgent care or emergency department. If the problem is serious and urgent, please call 911. ____________________________________________________________________________________________   GENERAL RISKS AND COMPLICATIONS  What are the risk, side effects and possible complications? Generally speaking, most procedures are safe.  However, with any procedure there are risks, side effects, and the possibility of complications.  The risks and complications are dependent upon the sites that are lesioned, or the type of nerve block to be performed.  The closer the procedure is to the spine, the more serious the risks are.  Great care is taken when placing the radio frequency needles, block needles or lesioning probes, but sometimes complications  can occur. 1. Infection: Any time there is an injection through the skin, there is a risk of infection.  This is why sterile conditions are used for these blocks.  There are four possible types of infection. 1. Localized skin infection. 2. Central Nervous System Infection-This can be in the form of Meningitis, which can be deadly. 3. Epidural Infections-This can be in the form of an epidural abscess, which can cause pressure inside of the spine, causing compression of the spinal cord with subsequent paralysis. This would require an emergency surgery to decompress, and there are no guarantees that the patient would recover from the paralysis. 4. Discitis-This is an infection of the intervertebral discs.  It occurs in about 1% of discography procedures.  It is difficult to treat and it may lead to surgery.        2. Pain: the needles have to go through skin and soft tissues, will cause soreness.       3. Damage to internal structures:  The nerves to be lesioned may be near blood vessels or    other nerves which can be potentially damaged.       4. Bleeding: Bleeding is more common if the patient is taking blood thinners such as  aspirin, Coumadin, Ticiid, Plavix, etc., or if he/she have some genetic predisposition  such as hemophilia. Bleeding into the spinal canal can cause compression of the  spinal  cord with subsequent paralysis.  This would require an emergency surgery to  decompress and there are no guarantees that the patient would recover from the  paralysis.       5. Pneumothorax:  Puncturing of a lung is a possibility, every time a needle is introduced in  the area of the chest or upper back.  Pneumothorax refers to free air around the  collapsed lung(s), inside of the thoracic cavity (chest cavity).  Another two possible  complications related to a similar event would include: Hemothorax and Chylothorax.   These are variations of the Pneumothorax, where instead of air around the collapsed   lung(s), you may have blood or chyle, respectively.       6. Spinal headaches: They may occur with any procedures in the area of the spine.       7. Persistent CSF (Cerebro-Spinal Fluid) leakage: This is a rare problem, but may occur  with prolonged intrathecal or epidural catheters either due to the formation of a fistulous  track or a dural tear.       8. Nerve damage: By working so close to the spinal cord, there is always a possibility of  nerve damage, which could be as serious as a permanent spinal cord injury with  paralysis.       9. Death:  Although rare, severe deadly allergic reactions known as "Anaphylactic  reaction" can occur to any of the medications used.      10. Worsening of the symptoms:  We can always make thing worse.  What are the chances of something like this happening? Chances of any of this occuring are extremely low.  By statistics, you have more of a chance of getting killed in a motor vehicle accident: while driving to the hospital than any of the above occurring .  Nevertheless, you should be aware that they are possibilities.  In general, it is similar to taking a shower.  Everybody knows that you can slip, hit your head and get killed.  Does that mean that you should not shower again?  Nevertheless always keep in mind that statistics do not mean anything if you happen to be on the wrong side of them.  Even if a procedure has a 1 (one) in a 1,000,000 (million) chance of going wrong, it you happen to be that one..Also, keep in mind that by statistics, you have more of a chance of having something go wrong when taking medications.  Who should not have this procedure? If you are on a blood thinning medication (e.g. Coumadin, Plavix, see list of "Blood Thinners"), or if you have an active infection going on, you should not have the procedure.  If you are taking any blood thinners, please inform your physician.  How should I prepare for this procedure?  Do not eat or drink  anything at least six hours prior to the procedure.  Bring a driver with you .  It cannot be a taxi.  Come accompanied by an adult that can drive you back, and that is strong enough to help you if your legs get weak or numb from the local anesthetic.  Take all of your medicines the morning of the procedure with just enough water to swallow them.  If you have diabetes, make sure that you are scheduled to have your procedure done first thing in the morning, whenever possible.  If you have diabetes, take only half of your insulin dose and notify our nurse  that you have done so as soon as you arrive at the clinic.  If you are diabetic, but only take blood sugar pills (oral hypoglycemic), then do not take them on the morning of your procedure.  You may take them after you have had the procedure.  Do not take aspirin or any aspirin-containing medications, at least eleven (11) days prior to the procedure.  They may prolong bleeding.  Wear loose fitting clothing that may be easy to take off and that you would not mind if it got stained with Betadine or blood.  Do not wear any jewelry or perfume  Remove any nail coloring.  It will interfere with some of our monitoring equipment.  NOTE: Remember that this is not meant to be interpreted as a complete list of all possible complications.  Unforeseen problems may occur.  BLOOD THINNERS The following drugs contain aspirin or other products, which can cause increased bleeding during surgery and should not be taken for 2 weeks prior to and 1 week after surgery.  If you should need take something for relief of minor pain, you may take acetaminophen which is found in Tylenol,m Datril, Anacin-3 and Panadol. It is not blood thinner. The products listed below are.  Do not take any of the products listed below in addition to any listed on your instruction sheet.  A.P.C or A.P.C with Codeine Codeine Phosphate Capsules #3 Ibuprofen Ridaura  ABC compound  Congesprin Imuran rimadil  Advil Cope Indocin Robaxisal  Alka-Seltzer Effervescent Pain Reliever and Antacid Coricidin or Coricidin-D  Indomethacin Rufen  Alka-Seltzer plus Cold Medicine Cosprin Ketoprofen S-A-C Tablets  Anacin Analgesic Tablets or Capsules Coumadin Korlgesic Salflex  Anacin Extra Strength Analgesic tablets or capsules CP-2 Tablets Lanoril Salicylate  Anaprox Cuprimine Capsules Levenox Salocol  Anexsia-D Dalteparin Magan Salsalate  Anodynos Darvon compound Magnesium Salicylate Sine-off  Ansaid Dasin Capsules Magsal Sodium Salicylate  Anturane Depen Capsules Marnal Soma  APF Arthritis pain formula Dewitt's Pills Measurin Stanback  Argesic Dia-Gesic Meclofenamic Sulfinpyrazone  Arthritis Bayer Timed Release Aspirin Diclofenac Meclomen Sulindac  Arthritis pain formula Anacin Dicumarol Medipren Supac  Analgesic (Safety coated) Arthralgen Diffunasal Mefanamic Suprofen  Arthritis Strength Bufferin Dihydrocodeine Mepro Compound Suprol  Arthropan liquid Dopirydamole Methcarbomol with Aspirin Synalgos  ASA tablets/Enseals Disalcid Micrainin Tagament  Ascriptin Doan's Midol Talwin  Ascriptin A/D Dolene Mobidin Tanderil  Ascriptin Extra Strength Dolobid Moblgesic Ticlid  Ascriptin with Codeine Doloprin or Doloprin with Codeine Momentum Tolectin  Asperbuf Duoprin Mono-gesic Trendar  Aspergum Duradyne Motrin or Motrin IB Triminicin  Aspirin plain, buffered or enteric coated Durasal Myochrisine Trigesic  Aspirin Suppositories Easprin Nalfon Trillsate  Aspirin with Codeine Ecotrin Regular or Extra Strength Naprosyn Uracel  Atromid-S Efficin Naproxen Ursinus  Auranofin Capsules Elmiron Neocylate Vanquish  Axotal Emagrin Norgesic Verin  Azathioprine Empirin or Empirin with Codeine Normiflo Vitamin E  Azolid Emprazil Nuprin Voltaren  Bayer Aspirin plain, buffered or children's or timed BC Tablets or powders Encaprin Orgaran Warfarin Sodium  Buff-a-Comp Enoxaparin Orudis Zorpin   Buff-a-Comp with Codeine Equegesic Os-Cal-Gesic   Buffaprin Excedrin plain, buffered or Extra Strength Oxalid   Bufferin Arthritis Strength Feldene Oxphenbutazone   Bufferin plain or Extra Strength Feldene Capsules Oxycodone with Aspirin   Bufferin with Codeine Fenoprofen Fenoprofen Pabalate or Pabalate-SF   Buffets II Flogesic Panagesic   Buffinol plain or Extra Strength Florinal or Florinal with Codeine Panwarfarin   Buf-Tabs Flurbiprofen Penicillamine   Butalbital Compound Four-way cold tablets Penicillin   Butazolidin Fragmin Pepto-Bismol   Carbenicillin Geminisyn Percodan  Carna Arthritis Reliever Geopen Persantine   Carprofen Gold's salt Persistin   Chloramphenicol Goody's Phenylbutazone   Chloromycetin Haltrain Piroxlcam   Clmetidine heparin Plaquenil   Cllnoril Hyco-pap Ponstel   Clofibrate Hydroxy chloroquine Propoxyphen         Before stopping any of these medications, be sure to consult the physician who ordered them.  Some, such as Coumadin (Warfarin) are ordered to prevent or treat serious conditions such as "deep thrombosis", "pumonary embolisms", and other heart problems.  The amount of time that you may need off of the medication may also vary with the medication and the reason for which you were taking it.  If you are taking any of these medications, please make sure you notify your pain physician before you undergo any procedures.         Pain Management Discharge Instructions  General Discharge Instructions :  If you need to reach your doctor call: Monday-Friday 8:00 am - 4:00 pm at [phone] or toll free [phone].  After clinic hours [phone] to have operator reach doctor.  Bring all of your medication bottles to all your appointments in the pain clinic.  To cancel or reschedule your appointment with Pain Management please remember to call 24 hours in advance to avoid a fee.  Refer to the educational materials which you have been given on:  General Risks, I had my Procedure. Discharge Instructions, Post Sedation.  Post Procedure Instructions:  The drugs you were given will stay in your system until tomorrow, so for the next 24 hours you should not drive, make any legal decisions or drink any alcoholic beverages.  You may eat anything you prefer, but it is better to start with liquids then soups and crackers, and gradually work up to solid foods.  Please notify your doctor immediately if you have any unusual bleeding, trouble breathing or pain that is not related to your normal pain.  Depending on the type of procedure that was done, some parts of your body may feel week and/or numb.  This usually clears up by tonight or the next day.  Walk with the use of an assistive device or accompanied by an adult for the 24 hours.  You may use ice on the affected area for the first 24 hours.  Put ice in a Ziploc bag and cover with a towel and place against area 15 minutes on 15 minutes off.  You may switch to heat after 24 hours.Epidural Steroid Injection Patient Information  Description: The epidural space surrounds the nerves as they exit the spinal cord.  In some patients, the nerves can be compressed and inflamed by a bulging disc or a tight spinal canal (spinal stenosis).  By injecting steroids into the epidural space, we can bring irritated nerves into direct contact with a potentially helpful medication.  These steroids act directly on the irritated nerves and can reduce swelling and inflammation which often leads to decreased pain.  Epidural steroids may be injected anywhere along the spine and from the neck to the low back depending upon the location of your pain.   After numbing the skin with local anesthetic (like Novocaine), a small needle is passed into the epidural space slowly.  You may experience a sensation of pressure while this is being done.  The entire block usually last less than 10 minutes.  Conditions which may be  treated by epidural steroids:   Low back and leg pain  Neck and arm pain  Spinal stenosis  Post-laminectomy  syndrome  Herpes zoster (shingles) pain  Pain from compression fractures  Preparation for the injection:  1. Do not eat any solid food or dairy products within 8 hours of your appointment.  2. You may drink clear liquids up to 3 hours before appointment.  Clear liquids include water, black coffee, juice or soda.  No milk or cream please. 3. You may take your regular medication, including pain medications, with a sip of water before your appointment  Diabetics should hold regular insulin (if taken separately) and take 1/2 normal NPH dos the morning of the procedure.  Carry some sugar containing items with you to your appointment. 4. A driver must accompany you and be prepared to drive you home after your procedure.  5. Bring all your current medications with your. 6. An IV may be inserted and sedation may be given at the discretion of the physician.   7. A blood pressure cuff, EKG and other monitors will often be applied during the procedure.  Some patients may need to have extra oxygen administered for a short period. 8. You will be asked to provide medical information, including your allergies, prior to the procedure.  We must know immediately if you are taking blood thinners (like Coumadin/Warfarin)  Or if you are allergic to IV iodine contrast (dye). We must know if you could possible be pregnant.  Possible side-effects:  Bleeding from needle site  Infection (rare, may require surgery)  Nerve injury (rare)  Numbness & tingling (temporary)  Difficulty urinating (rare, temporary)  Spinal headache ( a headache worse with upright posture)  Light -headedness (temporary)  Pain at injection site (several days)  Decreased blood pressure (temporary)  Weakness in arm/leg (temporary)  Pressure sensation in back/neck (temporary)  Call if you experience:  Fever/chills  associated with headache or increased back/neck pain.  Headache worsened by an upright position.  New onset weakness or numbness of an extremity below the injection site  Hives or difficulty breathing (go to the emergency room)  Inflammation or drainage at the infection site  Severe back/neck pain  Any new symptoms which are concerning to you  Please note:  Although the local anesthetic injected can often make your back or neck feel good for several hours after the injection, the pain will likely return.  It takes 3-7 days for steroids to work in the epidural space.  You may not notice any pain relief for at least that one week.  If effective, we will often do a series of three injections spaced 3-6 weeks apart to maximally decrease your pain.  After the initial series, we generally will wait several months before considering a repeat injection of the same type.  If you have any questions, please call [phone] [facility]

## 2020-02-06 NOTE — Progress Notes (Signed)
PROVIDER NOTE: Information contained herein reflects review and annotations entered in association with encounter. Interpretation of such information and data should be left to medically-trained personnel. Information provided to patient can be located elsewhere in the medical record under "Patient Instructions". Document created using STT-dictation technology, any transcriptional errors that may result from process are unintentional.    Patient: Howard Maxwell  Service Category: Procedure  Provider: Gaspar Cola, MD  DOB: 1945-04-18  DOS: 02/06/2020  Location: [facility]  MRN: [id number]  Setting: Ambulatory - outpatient  Referring Provider: Milinda Pointer, MD  Type: Established Patient  Specialty: Interventional Pain Management  PCP: Tracie Harrier, MD   Primary Reason for Visit: Interventional Pain Management Treatment. CC: Shoulder Pain (left)  Procedure:          Anesthesia, Analgesia, Anxiolysis:  Type: Diagnostic, Inter-Laminar, Cervical Epidural Steroid Injection  #1  Region: Posterior Cervico-thoracic Region Level: C7-T1 Laterality: Left-Sided Paramedial  Type: Local Anesthesia Indication(s): Analgesia         Route: Infiltration ([place]/IM) IV Access: Declined Sedation: Declined  Local Anesthetic: Lidocaine 1-2%  Position: Prone with head of the table was raised to facilitate breathing.   Indications: 1. 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 Cervical radiculopathy   3. Cervicalgia   4. DDD (degenerative disc disease), cervical   5. Chronic upper extremity pain (Primary Area of Pain) (Bilateral) (L>R)   6. History of cervical spinal surgery (C3-4 and C6-7 ACDF)    Pain Score: Pre-procedure: 4 /10 Post-procedure: 4 /10   Pre-op H&P Assessment:  Mr. Collister is a 74 y.o. (year old), male patient, seen today for interventional treatment. He  has a past surgical history that includes Foot  surgery (Bilateral); Anterior cervical decomp/discectomy fusion; Gallbladder surgery; Femur fracture surgery; Nasal reconstruction; Cardiac catheterization (12/07/2013); Back surgery; Cholecystectomy; Esophagogastroduodenoscopy (N/A, 12/18/2016); Colonoscopy with propofol (N/A, 12/18/2016); Cervical disc arthroplasty (N/A, 05/28/2017); Cataract extraction w/PHACO (Right, 06/22/2017); eye prosthesis; Anterior lateral lumbar fusion 4 levels (N/A, 08/23/2017); Lumbar percutaneous pedicle screw 4 level (N/A, 08/23/2017); Application of robotic assistance for spinal procedure (N/A, 08/23/2017); and Eye surgery (Left, 02/04/2018). Mr. Pilson has a current medication list which includes the following prescription(s): atorvastatin, azelastine, calcium carb-cholecalciferol, carvedilol, cetirizine, chlorpheniramine-hydrocodone, cholecalciferol, coq10, docusate sodium, ferrous sulfate, fluocinonide gel, fluorouracil, glipizide, guaifenesin, losartan, magnesium oxide, metformin, mirabegron er, morphine, multiple vitamins-minerals, neomycin-polymyxin b-dexamethasone, omega 3, polyethyl glycol-propyl glycol, spironolactone, tobramycin-dexamethasone, vitamin b-12, vitamin c, morphine, and morphine, and the following Facility-Administered Medications: ondansetron (ZOFRAN) 4 mg in sodium chloride 0.9 % 50 mL IVPB. His primarily concern today is the Shoulder Pain (left)  Initial Vital Signs:  Pulse/HCG Rate: 77ECG Heart Rate: 85 Temp: (!) 97.3 F (36.3 C) Resp: 16 BP: 119/75 SpO2: 95 %  BMI: Estimated body mass index is 34.44 kg/m as calculated from the following:   Height as of this encounter: 5\' 10"  (1.778 m).   Weight as of this encounter: 240 lb (108.9 kg).  Risk Assessment: Allergies: Reviewed. He is allergic to loratadine.  Allergy Precautions: None required Coagulopathies: Reviewed. None identified.  Blood-thinner therapy: None at this time Active Infection(s): Reviewed. None identified. Mr. Schipani is  afebrile  Site Confirmation: Mr. Blanck was asked to confirm the procedure and laterality before marking the site Procedure checklist: Completed Consent: Before the procedure and under the influence of no sedative(s), amnesic(s), or anxiolytics, the patient was informed of the treatment options, risks and possible complications. To fulfill our ethical and legal  obligations, as recommended by the American Medical Association's Code of Ethics, I have informed the patient of my clinical impression; the nature and purpose of the treatment or procedure; the risks, benefits, and possible complications of the intervention; the alternatives, including doing nothing; the risk(s) and benefit(s) of the alternative treatment(s) or procedure(s); and the risk(s) and benefit(s) of doing nothing. The patient was provided information about the general risks and possible complications associated with the procedure. These may include, but are not limited to: failure to achieve desired goals, infection, bleeding, organ or nerve damage, allergic reactions, paralysis, and death. In addition, the patient was informed of those risks and complications associated to Spine-related procedures, such as failure to decrease pain; infection (i.e.: Meningitis, epidural or intraspinal abscess); bleeding (i.e.: epidural hematoma, subarachnoid hemorrhage, or any other type of intraspinal or peri-dural bleeding); organ or nerve damage (i.e.: Any type of peripheral nerve, nerve root, or spinal cord injury) with subsequent damage to sensory, motor, and/or autonomic systems, resulting in permanent pain, numbness, and/or weakness of one or several areas of the body; allergic reactions; (i.e.: anaphylactic reaction); and/or death. Furthermore, the patient was informed of those risks and complications associated with the medications. These include, but are not limited to: allergic reactions (i.e.: anaphylactic or anaphylactoid reaction(s)); adrenal  axis suppression; blood sugar elevation that in diabetics may result in ketoacidosis or comma; water retention that in patients with history of congestive heart failure may result in shortness of breath, pulmonary edema, and decompensation with resultant heart failure; weight gain; swelling or edema; medication-induced neural toxicity; particulate matter embolism and blood vessel occlusion with resultant organ, and/or nervous system infarction; and/or aseptic necrosis of one or more joints. Finally, the patient was informed that Medicine is not an exact science; therefore, there is also the possibility of unforeseen or unpredictable risks and/or possible complications that may result in a catastrophic outcome. The patient indicated having understood very clearly. We have given the patient no guarantees and we have made no promises. Enough time was given to the patient to ask questions, all of which were answered to the patient's satisfaction. Mr. Sciulli has indicated that he wanted to continue with the procedure. Attestation: I, the ordering provider, attest that I have discussed with the patient the benefits, risks, side-effects, alternatives, likelihood of achieving goals, and potential problems during recovery for the procedure that I have provided informed consent. Date  Time: 02/06/2020 10:50 AM  Pre-Procedure Preparation:  Monitoring: As per clinic protocol. Respiration, ETCO2, SpO2, BP, heart rate and rhythm monitor placed and checked for adequate function Safety Precautions: Patient was assessed for positional comfort and pressure points before starting the procedure. Time-out: I initiated and conducted the "Time-out" before starting the procedure, as per protocol. The patient was asked to participate by confirming the accuracy of the "Time Out" information. Verification of the correct person, site, and procedure were performed and confirmed by me, the nursing staff, and the patient. "Time-out"  conducted as per Joint Commission's Universal Protocol (UP.01.01.01). Time: 1144  Description of Procedure:          Target Area: For Epidural Steroid injections the target is the interlaminar space, initially targeting the lower border of the superior vertebral body lamina. Approach: Paramedial approach. Area Prepped: Entire PosteriorCervical Region DuraPrep (Iodine Povacrylex [0.7% available iodine] and Isopropyl Alcohol, 74% w/w) Safety Precautions: Aspiration looking for blood return was conducted prior to all injections. At no point did we inject any substances, as a needle was being advanced. No attempts  were made at seeking any paresthesias. Safe injection practices and needle disposal techniques used. Medications properly checked for expiration dates. SDV (single dose vial) medications used. Description of the Procedure: Protocol guidelines were followed. The procedure needle was introduced through the skin, ipsilateral to the reported pain, and advanced to the target area. Bone was contacted and the needle walked caudad, until the lamina was cleared. The epidural space was identified using "loss-of-resistance technique" with 2-3 ml of PF-NaCl (0.9% NSS), in a 5cc LOR glass syringe. Vitals:   02/06/20 1050 02/06/20 1144 02/06/20 1150 02/06/20 1155  BP: 119/75 (!) 151/92 (!) 155/85 (!) 156/92  Pulse: 77     Resp: 16 16 18 16   Temp: (!) 97.3 F (36.3 C)     TempSrc: Temporal     SpO2: 95% 95% 95% 95%  Weight: 240 lb (108.9 kg)     Height: 5\' 10"  (1.778 m)       Start Time: 1144 hrs. End Time: 1153 hrs. Materials:  Needle(s) Type: Epidural needle Gauge: 17G Length: 3.5-in Medication(s): Please see orders for medications and dosing details.  Imaging Guidance (Spinal):          Type of Imaging Technique: Fluoroscopy Guidance (Spinal) Indication(s): Assistance in needle guidance and placement for procedures requiring needle placement in or near specific anatomical locations not  easily accessible without such assistance. Exposure Time: Please see nurses notes. Contrast: Before injecting any contrast, we confirmed that the patient did not have an allergy to iodine, shellfish, or radiological contrast. Once satisfactory needle placement was completed at the desired level, radiological contrast was injected. Contrast injected under live fluoroscopy. No contrast complications. See chart for type and volume of contrast used. Fluoroscopic Guidance: I was personally present during the use of fluoroscopy. "Tunnel Vision Technique" used to obtain the best possible view of the target area. Parallax error corrected before commencing the procedure. "Direction-depth-direction" technique used to introduce the needle under continuous pulsed fluoroscopy. Once target was reached, antero-posterior, oblique, and lateral fluoroscopic projection used confirm needle placement in all planes. Images permanently stored in EMR. Interpretation: I personally interpreted the imaging intraoperatively. Adequate needle placement confirmed in multiple planes. Appropriate spread of contrast into desired area was observed. No evidence of afferent or efferent intravascular uptake. No intrathecal or subarachnoid spread observed. Permanent images saved into the patient's record.  Antibiotic Prophylaxis:   Anti-infectives (From admission, onward)   None     Indication(s): None identified  Post-operative Assessment:  Post-procedure Vital Signs:  Pulse/HCG Rate: 7780 Temp: (!) 97.3 F (36.3 C) Resp: 16 BP: (!) 156/92 SpO2: 95 %  EBL: None  Complications: No immediate post-treatment complications observed by team, or reported by patient.  Note: The patient tolerated the entire procedure well. A repeat set of vitals were taken after the procedure and the patient was kept under observation following institutional policy, for this type of procedure. Post-procedural neurological assessment was performed,  showing return to baseline, prior to discharge. The patient was provided with post-procedure discharge instructions, including a section on how to identify potential problems. Should any problems arise concerning this procedure, the patient was given instructions to immediately contact us, at any time, without hesitation. In any case, we plan to contact the patient by telephone for a follow-up status report regarding this interventional procedure.  Comments:  No additional relevant information.  Plan of Care  Orders:  Orders Placed This Encounter  Procedures  . Cervical Epidural Injection    Procedure: Cervical Epidural Steroid Injection/Block Purpose:  Therapeutic Indication(s): Radiculitis and cervicalgia associater with cervical degenerative disc disease.    Scheduling Instructions:     Level(s): C7-T1     Laterality: Left-sided     Sedation: Patient's choice.     Timeframe: Today    Order Specific Question:   Where will this procedure be performed?    Answer:   [facility]    Comments:   by Dr. Dossie Arbour  . [facility] C-ARM 1-60 MIN NO REPORT    Intraoperative interpretation by procedural physician at [place].    Standing Status:   Standing    Number of Occurrences:   1    Order Specific Question:   Reason for exam:    Answer:   Assistance in needle guidance and placement for procedures requiring needle placement in or near specific anatomical locations not easily accessible without such assistance.  . Informed Consent Details: Physician/Practitioner Attestation; Transcribe to consent form and obtain patient signature    Nursing Order: Transcribe to consent form and obtain patient signature. Note: Always confirm laterality of pain with Mr. Smeltzer, before procedure.    Order Specific Question:   Physician/Practitioner attestation of informed consent for procedure/surgical case    Answer:   I, the physician/practitioner, attest that I have discussed with the  patient the benefits, risks, side effects, alternatives, likelihood of achieving goals and potential problems during recovery for the procedure that I have provided informed consent.    Order Specific Question:   Procedure    Answer:   Cervical Epidural Steroid Injection (CESI) under fluoroscopic guidance    Order Specific Question:   Physician/Practitioner performing the procedure    Answer:   Zuri Lascala A. Dossie Arbour MD    Order Specific Question:   Indication/Reason    Answer:   Cervicalgia (Neck Pain) with or without Cervical Radiculopathy/Radiculitis (Arm/Shoulder Pain, Numbness, and/or weakness), secondary to Cervical and/or Cervicothoracic Degenerative Disc Disease (DDD), with or without Intervertebral Disc Displacement (IVD  . Provide equipment / supplies at bedside    "Epidural Tray" (Disposable  single use) Catheter: NOT required    Standing Status:   Standing    Number of Occurrences:   1    Order Specific Question:   Specify    Answer:   Epidural Tray   Chronic Opioid Analgesic:  Oxycodone IR 5 mg 1 tablet p.o. every 6 hours (20 mg/day of oxycodone) MME/day: 30 mg/day.   Medications ordered for procedure: Meds ordered this encounter  Medications  . iohexol (OMNIPAQUE) 180 MG/ML injection 10 mL    Must be Myelogram-compatible. If not available, you may substitute with a water-soluble, non-ionic, hypoallergenic, myelogram-compatible radiological contrast medium.  Marland Kitchen lidocaine (XYLOCAINE) 2 % (with pres) injection 400 mg  . DISCONTD: lactated ringers infusion 1,000 mL  . DISCONTD: midazolam (VERSED) 5 MG/5ML injection 1-2 mg    Make sure Flumazenil is available in the pyxis when using this medication. If oversedation occurs, administer 0.2 mg IV over 15 sec. If after 45 sec no response, administer 0.2 mg again over 1 min; may repeat at 1 min intervals; not to exceed 4 doses (1 mg)  . DISCONTD: fentaNYL (SUBLIMAZE) injection 25-50 mcg    Make sure Narcan is available in the pyxis when  using this medication. In the event of respiratory depression (RR< 8/min): Titrate NARCAN (naloxone) in increments of 0.1 to 0.2 mg IV at 2-3 minute intervals, until desired degree of reversal.  . sodium chloride flush (NS) 0.9 % injection 1 mL  .  ropivacaine (PF) 2 mg/mL (0.2%) (NAROPIN) injection 1 mL  . dexamethasone (DECADRON) injection 10 mg   Medications administered: We administered iohexol, lidocaine, sodium chloride flush, ropivacaine (PF) 2 mg/mL (0.2%), and dexamethasone.  See the medical record for exact dosing, route, and time of administration.  Follow-up plan:   Return in about 2 weeks (around 02/20/2020) for (F2F), (PP) Follow-up.       Interventional management options: Planned, scheduled, and/or pending: Not at this time.   Considering: Diagnostic bilateral lumbar facetblock  Possible bilateral lumbar facet RFA Diagnostic L3-4 interspinous ligament block Possible L3-4 bilateral medial branch RFA Diagnostic right sided L2-3 and L3-4 lumbar epiduralsteroid injection  Diagnostic bilateral L1-2 &L4-5 transforaminalepidural steroid injection  Diagnostic bilateral L2-3 transforaminal epidural steroid injection Diagnostic caudal epidural steroid injection + diagnostic epidurogram Possible Racz procedure Diagnostic bilateral intra-articular hipjoint injection  Diagnostic bilateral femoral nerve and obturator nerveblock  Possible bilateral femoral nerve and obturator nerve RFA Diagnostic left cervical epidural steroid injection  Diagnostic bilateral cervical facet block Possible bilateral cervical facet RFA Diagnostic left Genicular nerve block Possible left Genicular nerve radiofrequencyablation  Possible intrathecal opioid trial   Palliative PRN treatment(s): Palliative left IA Hyalgan knee injection S2/N1  Therapeutic bilateral L2 TFESI #2  Therapeutic right L3-4 LESI #2  Palliative right IA hip joint injection #2 (100/100/90/90) (last done  09/07/2019)  Diagnostic left suprascapular nerve block #2  Diagnostic/therapeutic left CESI #2     Recent Visits Date Type Provider Dept  12/27/19 Telemedicine Milinda Pointer, MD [facility]  11/30/19 Procedure visit Milinda Pointer, [facility]  11/28/19 Office Visit Milinda Pointer, MD [facility]  Showing recent visits within past 90 days and meeting all other requirements Today's Visits Date Type Provider Dept  02/06/20 Procedure visit Milinda Pointer, MD [facility]  Showing today's visits and meeting all other requirements Future Appointments Date Type Provider Dept  02/21/20 Appointment Milinda Pointer, MD [facility]  Showing future appointments within next 90 days and meeting all other requirements  Disposition: Discharge home  Discharge (Date  Time): 02/06/2020; 1205 hrs.   Primary Care Physician: Tracie Harrier, MD Location: [facility] Note by: Gaspar Cola, MD Date: 02/06/2020; Time: 12:19 PM  Disclaimer:  Medicine is not an Chief Strategy Officer. The only guarantee in medicine is that nothing is guaranteed. It is important to note that the decision to proceed with this intervention was based on the information collected from the patient. The Data and conclusions were drawn from the patient's questionnaire, the interview, and the physical examination. Because the information was provided in large part by the patient, it cannot be guaranteed that it has not been purposely or unconsciously manipulated. Every effort has been made to obtain as much relevant data as possible for this evaluation. It is important to note that the conclusions that lead to this procedure are derived in large part from the available data. Always take into account that the treatment will also be dependent on availability of resources and existing treatment guidelines, considered by other Pain Management  Practitioners as being common knowledge and practice, at the time of the intervention. For Medico-Legal purposes, it is also important to point out that variation in procedural techniques and pharmacological choices are the acceptable norm. The indications, contraindications, technique, and results of the above procedure should only be interpreted and judged by a Board-Certified Interventional Pain Specialist with extensive familiarity and expertise in the same exact procedure and technique.

## 2020-02-06 NOTE — Progress Notes (Signed)
Safety precautions to be maintained throughout the outpatient stay will include: orient to surroundings, keep bed in low position, maintain call bell within reach at all times, provide assistance with transfer out of bed and ambulation.  

## 2020-02-07 ENCOUNTER — Telehealth: Payer: Self-pay | Admitting: *Deleted

## 2020-02-07 NOTE — Telephone Encounter (Signed)
No problems post procedure. 

## 2020-02-20 ENCOUNTER — Other Ambulatory Visit: Payer: Self-pay

## 2020-02-20 ENCOUNTER — Ambulatory Visit (HOSPITAL_BASED_OUTPATIENT_CLINIC_OR_DEPARTMENT_OTHER): Payer: Medicare Other | Attending: Otolaryngology | Admitting: Internal Medicine

## 2020-02-20 DIAGNOSIS — R0683 Snoring: Secondary | ICD-10-CM

## 2020-02-20 DIAGNOSIS — G4733 Obstructive sleep apnea (adult) (pediatric): Secondary | ICD-10-CM | POA: Insufficient documentation

## 2020-02-20 DIAGNOSIS — R0681 Apnea, not elsewhere classified: Secondary | ICD-10-CM

## 2020-02-21 ENCOUNTER — Ambulatory Visit: Payer: Medicare Other | Attending: Pain Medicine | Admitting: Pain Medicine

## 2020-02-21 ENCOUNTER — Encounter: Payer: Self-pay | Admitting: Pain Medicine

## 2020-02-21 VITALS — BP 130/66 | HR 71 | Ht 70.0 in | Wt 240.0 lb

## 2020-02-21 DIAGNOSIS — K219 Gastro-esophageal reflux disease without esophagitis: Secondary | ICD-10-CM | POA: Insufficient documentation

## 2020-02-21 DIAGNOSIS — M79601 Pain in right arm: Secondary | ICD-10-CM | POA: Diagnosis not present

## 2020-02-21 DIAGNOSIS — G894 Chronic pain syndrome: Secondary | ICD-10-CM | POA: Diagnosis present

## 2020-02-21 DIAGNOSIS — G8929 Other chronic pain: Secondary | ICD-10-CM | POA: Diagnosis present

## 2020-02-21 DIAGNOSIS — M7918 Myalgia, other site: Secondary | ICD-10-CM | POA: Insufficient documentation

## 2020-02-21 DIAGNOSIS — M25512 Pain in left shoulder: Secondary | ICD-10-CM

## 2020-02-21 DIAGNOSIS — M542 Cervicalgia: Secondary | ICD-10-CM | POA: Diagnosis present

## 2020-02-21 DIAGNOSIS — Z79899 Other long term (current) drug therapy: Secondary | ICD-10-CM | POA: Insufficient documentation

## 2020-02-21 DIAGNOSIS — F1120 Opioid dependence, uncomplicated: Secondary | ICD-10-CM | POA: Diagnosis present

## 2020-02-21 DIAGNOSIS — M545 Low back pain, unspecified: Secondary | ICD-10-CM | POA: Diagnosis present

## 2020-02-21 DIAGNOSIS — M79602 Pain in left arm: Secondary | ICD-10-CM

## 2020-02-21 DIAGNOSIS — M47812 Spondylosis without myelopathy or radiculopathy, cervical region: Secondary | ICD-10-CM | POA: Diagnosis not present

## 2020-02-21 MED ORDER — MORPHINE SULFATE ER 15 MG PO TBCR: 15.0000 mg | EXTENDED_RELEASE_TABLET | Freq: Two times a day (BID) | ORAL | 0 refills | Status: DC

## 2020-02-21 MED ORDER — MAGNESIUM OXIDE -MG SUPPLEMENT 500 MG PO CAPS: 1.0000 | ORAL_CAPSULE | Freq: Two times a day (BID) | ORAL | 0 refills | Status: DC

## 2020-02-21 NOTE — Progress Notes (Signed)
Nursing Pain Medication Assessment:  Safety precautions to be maintained throughout the outpatient stay will include: orient to surroundings, keep bed in low position, maintain call bell within reach at all times, provide assistance with transfer out of bed and ambulation.  Medication Inspection Compliance: Pill count conducted under aseptic conditions, in front of the patient. Neither the pills nor the bottle was removed from the patient's sight at any time. Once count was completed pills were immediately returned to the patient in their original bottle.  Medication: Morphine ER (MSContin) Pill/Patch Count: 6 of 60 pills remain Pill/Patch Appearance: Markings consistent with prescribed medication Bottle Appearance: Standard pharmacy container. Clearly labeled. Filled Date: 56 / 6 / 21 Last Medication intake:  YesterdaySafety precautions to be maintained throughout the outpatient stay will include: orient to surroundings, keep bed in low position, maintain call bell within reach at all times, provide assistance with transfer out of bed and ambulation.

## 2020-02-21 NOTE — Patient Instructions (Addendum)
____________________________________________________________________________________________  Medication Rules  Purpose: To inform patients, and their family members, of our rules and regulations.  Applies to: All patients receiving prescriptions (written or electronic).  Pharmacy of record: Pharmacy where electronic prescriptions will be sent. If written prescriptions are taken to a different pharmacy, please inform the nursing staff. The pharmacy listed in the electronic medical record should be the one where you would like electronic prescriptions to be sent.  Electronic prescriptions: In compliance with the [place] (STOP) Act of 2017 (Session Lanny Cramp [id number]), effective March 23, 2018, all controlled substances must be electronically prescribed. Calling prescriptions to the pharmacy will cease to exist.  Prescription refills: Only during scheduled appointments. Applies to all prescriptions.  NOTE: The following applies primarily to controlled substances (Opioid* Pain Medications).   Type of encounter (visit): For patients receiving controlled substances, face-to-face visits are required. (Not an option or up to the patient.)  Patient's responsibilities: 1. Pain Pills: Bring all pain pills to every appointment (except for procedure appointments). 2. Pill Bottles: Bring pills in original pharmacy bottle. Always bring the newest bottle. Bring bottle, even if empty. 3. Medication refills: You are responsible for knowing and keeping track of what medications you take and those you need refilled. The day before your appointment: write a list of all prescriptions that need to be refilled. The day of the appointment: give the list to the admitting nurse. Prescriptions will be written only during appointments. No prescriptions will be written on procedure days. If you forget a medication: it will not be "Called in", "Faxed", or "electronically sent".  You will need to get another appointment to get these prescribed. No early refills. Do not call asking to have your prescription filled early. 4. Prescription Accuracy: You are responsible for carefully inspecting your prescriptions before leaving our office. Have the discharge nurse carefully go over each prescription with you, before taking them home. Make sure that your name is accurately spelled, that your address is correct. Check the name and dose of your medication to make sure it is accurate. Check the number of pills, and the written instructions to make sure they are clear and accurate. Make sure that you are given enough medication to last until your next medication refill appointment. 5. Taking Medication: Take medication as prescribed. When it comes to controlled substances, taking less pills or less frequently than prescribed is permitted and encouraged. Never take more pills than instructed. Never take medication more frequently than prescribed.  6. Inform other Doctors: Always inform, all of your healthcare providers, of all the medications you take. 7. Pain Medication from other Providers: You are not allowed to accept any additional pain medication from any other Doctor or Healthcare provider. There are two exceptions to this rule. (see below) In the event that you require additional pain medication, you are responsible for notifying us, as stated below. 8. Cough Medicine: Often these contain an opioid, such as codeine or hydrocodone. Never accept or take cough medicine containing these opioids if you are already taking an opioid* medication. The combination may cause respiratory failure and death. 9. Medication Agreement: You are responsible for carefully reading and following our Medication Agreement. This must be signed before receiving any prescriptions from our practice. Safely store a copy of your signed Agreement. Violations to the Agreement will result in no further prescriptions.  (Additional copies of our Medication Agreement are available upon request.) 10. Laws, Rules, & Regulations: All patients are expected to follow all  Federal and [employer], [employer], [employer]. Ignorance of the Laws does not constitute a valid excuse.  11. Illegal drugs and Controlled Substances: The use of illegal substances (including, but not limited to marijuana and its derivatives) and/or the illegal use of any controlled substances is strictly prohibited. Violation of this rule may result in the immediate and permanent discontinuation of any and all prescriptions being written by our practice. The use of any illegal substances is prohibited. 12. Adopted CDC guidelines & recommendations: Target dosing levels will be at or below 60 MME/day. Use of benzodiazepines** is not recommended.  Exceptions: There are only two exceptions to the rule of not receiving pain medications from other Healthcare Providers. 1. Exception #1 (Emergencies): In the event of an emergency (i.e.: accident requiring emergency care), you are allowed to receive additional pain medication. However, you are responsible for: As soon as you are able, call our office [phone], at any time of the day or night, and leave a message stating your name, the date and nature of the emergency, and the name and dose of the medication prescribed. In the event that your call is answered by a member of our staff, make sure to document and save the date, time, and the name of the person that took your information.  2. Exception #2 (Planned Surgery): In the event that you are scheduled by another doctor or dentist to have any type of surgery or procedure, you are allowed (for a period no longer than 30 days), to receive additional pain medication, for the acute post-op pain. However, in this case, you are responsible for picking up a copy of our "Post-op Pain Management for Surgeons" handout, and giving it to your surgeon or dentist. This  document is available at our office, and does not require an appointment to obtain it. Simply go to our office during business hours (Monday-Thursday from 8:00 AM to 4:00 PM) (Friday 8:00 AM to 12:00 Noon) or if you have a scheduled appointment with [place], prior to your surgery, and ask for it by name. In addition, you are responsible for: calling our office [phone], at any time of the day or night, and leaving a message stating your name, name of your surgeon, type of surgery, and date of procedure or surgery. Failure to comply with your responsibilities may result in termination of therapy involving the controlled substances.  *Opioid medications include: morphine, codeine, oxycodone, oxymorphone, hydrocodone, hydromorphone, meperidine, tramadol, tapentadol, buprenorphine, fentanyl, methadone. **Benzodiazepine medications include: diazepam (Valium), alprazolam (Xanax), clonazepam (Klonopine), lorazepam (Ativan), clorazepate (Tranxene), chlordiazepoxide (Librium), estazolam (Prosom), oxazepam (Serax), temazepam (Restoril), triazolam (Halcion) (Last updated: 02/19/2020) ____________________________________________________________________________________________   ____________________________________________________________________________________________  Medication Recommendations and Reminders  Applies to: All patients receiving prescriptions (written and/or electronic).  Medication Rules & Regulations: These rules and regulations exist for your safety and that of others. They are not flexible and neither are we. Dismissing or ignoring them will be considered "non-compliance" with medication therapy, resulting in complete and irreversible termination of such therapy. (See document titled "Medication Rules" for more details.) In all conscience, because of safety reasons, we cannot continue providing a therapy where the patient does not follow instructions.  Pharmacy of record:   Definition:  This is the pharmacy where your electronic prescriptions will be sent.   We do not endorse any particular pharmacy, however, we have experienced problems with [facility] not securing enough medication supply for the community.  We do not restrict you in your choice of pharmacy. However,  once we write for your prescriptions, we will NOT be re-sending more prescriptions to fix restricted supply problems created by your pharmacy, or your insurance.   The pharmacy listed in the electronic medical record should be the one where you want electronic prescriptions to be sent.  If you choose to change pharmacy, simply notify our nursing staff.  Recommendations:  Keep all of your pain medications in a safe place, under lock and key, even if you live alone. We will NOT replace lost, stolen, or damaged medication.  After you fill your prescription, take 1 week's worth of pills and put them away in a safe place. You should keep a separate, properly labeled bottle for this purpose. The remainder should be kept in the original bottle. Use this as your primary supply, until it runs out. Once it's gone, then you know that you have 1 week's worth of medicine, and it is time to come in for a prescription refill. If you do this correctly, it is unlikely that you will ever run out of medicine.  To make sure that the above recommendation works, it is very important that you make sure your medication refill appointments are scheduled at least 1 week before you run out of medicine. To do this in an effective manner, make sure that you do not leave the office without scheduling your next medication management appointment. Always ask the nursing staff to show you in your prescription , when your medication will be running out. Then arrange for the receptionist to get you a return appointment, at least 7 days before you run out of medicine. Do not wait until you have 1 or 2 pills left, to come in. This is very poor planning and  does not take into consideration that we may need to cancel appointments due to bad weather, sickness, or emergencies affecting our staff.  DO NOT ACCEPT A "Partial Fill": If for any reason your pharmacy does not have enough pills/tablets to completely fill or refill your prescription, do not allow for a "partial fill". The law allows the pharmacy to complete that prescription within 72 hours, without requiring a new prescription. If they do not fill the rest of your prescription within those 72 hours, you will need a separate prescription to fill the remaining amount, which we will NOT provide. If the reason for the partial fill is your insurance, you will need to talk to the pharmacist about payment alternatives for the remaining tablets, but again, DO NOT ACCEPT A PARTIAL FILL, unless you can trust your pharmacist to obtain the remainder of the pills within 72 hours.  Prescription refills and/or changes in medication(s):   Prescription refills, and/or changes in dose or medication, will be conducted only during scheduled medication management appointments. (Applies to both, written and electronic prescriptions.)  No refills on procedure days. No medication will be changed or started on procedure days. No changes, adjustments, and/or refills will be conducted on a procedure day. Doing so will interfere with the diagnostic portion of the procedure.  No phone refills. No medications will be "called into the pharmacy".  No Fax refills.  No weekend refills.  No Holliday refills.  No after hours refills.  Remember:  Business hours are:  Monday to Thursday 8:00 AM to 4:00 PM Provider's Schedule: Milinda Pointer, MD - Appointments are:  Medication management: Monday and Wednesday 8:00 AM to 4:00 PM Procedure day: Tuesday and Thursday 7:30 AM to 4:00 PM Gillis Santa, MD - Appointments are:  Medication management: Tuesday and Thursday 8:00 AM to 4:00 PM Procedure day: Monday and Wednesday  7:30 AM to 4:00 PM (Last update: 10/11/2019) ____________________________________________________________________________________________   ____________________________________________________________________________________________  Preparing for Procedure with Sedation  Procedure appointments are limited to planned procedures: . No Prescription Refills. . No disability issues will be discussed. . No medication changes will be discussed.  Instructions: . Oral Intake: Do not eat or drink anything for at least 8 hours prior to your procedure. (Exception: Blood Pressure Medication. See below.) . Transportation: Unless otherwise stated by your physician, you may drive yourself after the procedure. . Blood Pressure Medicine: Do not forget to take your blood pressure medicine with a sip of water the morning of the procedure. If your Diastolic (lower reading)is above 100 mmHg, elective cases will be cancelled/rescheduled. . Blood thinners: These will need to be stopped for procedures. Notify our staff if you are taking any blood thinners. Depending on which one you take, there will be specific instructions on how and when to stop it. . Diabetics on insulin: Notify the staff so that you can be scheduled 1st case in the morning. If your diabetes requires high dose insulin, take only  of your normal insulin dose the morning of the procedure and notify the staff that you have done so. . Preventing infections: Shower with an antibacterial soap the morning of your procedure. . Build-up your immune system: Take 1000 mg of Vitamin C with every meal (3 times a day) the day prior to your procedure. Marland Kitchen Antibiotics: Inform the staff if you have a condition or reason that requires you to take antibiotics before dental procedures. . Pregnancy: If you are pregnant, call and cancel the procedure. . Sickness: If you have a cold, fever, or any active infections, call and cancel the procedure. . Arrival: You must be  in the facility at least 30 minutes prior to your scheduled procedure. . Children: Do not bring children with you. . Dress appropriately: Bring dark clothing that you would not mind if they get stained. . Valuables: Do not bring any jewelry or valuables.  Reasons to call and reschedule or cancel your procedure: (Following these recommendations will minimize the risk of a serious complication.) . Surgeries: Avoid having procedures within 2 weeks of any surgery. (Avoid for 2 weeks before or after any surgery). . Flu Shots: Avoid having procedures within 2 weeks of a flu shots or . (Avoid for 2 weeks before or after immunizations). . Barium: Avoid having a procedure within 7-10 days after having had a radiological study involving the use of radiological contrast. (Myelograms, Barium swallow or enema study). . Heart attacks: Avoid any elective procedures or surgeries for the initial 6 months after a "Myocardial Infarction" (Heart Attack). . Blood thinners: It is imperative that you stop these medications before procedures. Let us know if you if you take any blood thinner.  . Infection: Avoid procedures during or within two weeks of an infection (including chest colds or gastrointestinal problems). Symptoms associated with infections include: Localized redness, fever, chills, night sweats or profuse sweating, burning sensation when voiding, cough, congestion, stuffiness, runny nose, sore throat, diarrhea, nausea, vomiting, cold or Flu symptoms, recent or current infections. It is specially important if the infection is over the area that we intend to treat. Marland Kitchen Heart and lung problems: Symptoms that may suggest an active cardiopulmonary problem include: cough, chest pain, breathing difficulties or shortness of breath, dizziness, ankle swelling, uncontrolled high or unusually low blood pressure, and/or palpitations.  If you are experiencing any of these symptoms, cancel your procedure and contact your primary  care physician for an evaluation.  Remember:  Regular Business hours are:  Monday to Thursday 8:00 AM to 4:00 PM  Provider's Schedule: Milinda Pointer, MD:  Procedure days: Tuesday and Thursday 7:30 AM to 4:00 PM  Gillis Santa, MD:  Procedure days: Monday and Wednesday 7:30 AM to 4:00 PM ____________________________________________________________________________________________
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
## 2020-02-24 DIAGNOSIS — R0683 Snoring: Secondary | ICD-10-CM | POA: Diagnosis not present

## 2020-02-24 DIAGNOSIS — G4733 Obstructive sleep apnea (adult) (pediatric): Secondary | ICD-10-CM

## 2020-02-24 NOTE — Procedures (Signed)
   Patient Name: Howard Maxwell, Howard Maxwell Date: 02/21/2020 Gender: Male D.O.B: 30-Nov-1945 Age (years): 74 Referring Provider: Melida Quitter Height (inches): 52 Interpreting Physician: Baird Lyons MD, ABSM Weight (lbs): 240 RPSGT: Jacolyn Reedy BMI: 34 MRN: [id number] Neck Size: 19.00  CLINICAL INFORMATION Sleep Study Type: HST Indication for sleep study: none noted Epworth Sleepiness Score: 11  SLEEP STUDY TECHNIQUE A multi-channel overnight portable sleep study was performed. The channels recorded were: nasal airflow, thoracic respiratory movement, and oxygen saturation with a pulse oximetry. Snoring was also monitored.  MEDICATIONS Patient self administered medications include: none reported.  SLEEP ARCHITECTURE Patient was studied for 381.2 minutes. The sleep efficiency was 100.0 % and the patient was supine for 20.3%. The arousal index was 0.0 per hour.  RESPIRATORY PARAMETERS The overall AHI was 62.5 per hour, with a central apnea index of 0.0 per hour. The oxygen nadir was 75% during sleep.  CARDIAC DATA Mean heart rate during sleep was 68.7 bpm.  IMPRESSIONS - Severe obstructive sleep apnea occurred during this study (AHI = 62.5/h). - No significant central sleep apnea occurred during this study (CAI = 0.0/h). - Severe oxygen desaturation was noted during this study (Min O2 = 75%). Mean O2 sat 90%. - Patient snored.  DIAGNOSIS - Obstructive Sleep Apnea (G47.33)  RECOMMENDATIONS - Suggest CPAP titration sleep study or autopap. Other options would be based on clinical judgment. - Be careful with alcohol, sedatives and other CNS depressants that may worsen sleep apnea and disrupt normal sleep architecture. - Sleep hygiene should be reviewed to assess factors that may improve sleep quality. - Weight management and regular exercise should be initiated or continued.  [Electronically signed] 02/24/2020 11:26 AM  Baird Lyons MD, ABSM Diplomate, American Board  of Sleep Medicine   NPI: [id number]                          [place], [place] [facility]  ELECTRONICALLY SIGNED ON:  02/24/2020, 11:23 AM [place] PH: [phone]   FX: [phone] [place]

## 2020-03-20 ENCOUNTER — Telehealth: Payer: Self-pay | Admitting: Pain Medicine

## 2020-03-20 NOTE — Telephone Encounter (Signed)
Patient would like to have procedure injections in his hip.  Right side. Please verify with Dr. Dossie Arbour has order for this.

## 2020-03-20 NOTE — Telephone Encounter (Signed)
Returned call to patient. No answer. LVM for him to call back to the office and make an evaluation appointment to discuss his hip pain. No orders for hip injection were noted. The last discussion about a procedure was Cervical facets.

## 2020-03-29 ENCOUNTER — Other Ambulatory Visit: Payer: Self-pay | Admitting: Cardiovascular Disease

## 2020-04-11 ENCOUNTER — Other Ambulatory Visit: Payer: Self-pay

## 2020-04-11 ENCOUNTER — Encounter (HOSPITAL_BASED_OUTPATIENT_CLINIC_OR_DEPARTMENT_OTHER): Payer: Self-pay | Admitting: Otolaryngology

## 2020-04-12 ENCOUNTER — Telehealth: Payer: Self-pay | Admitting: *Deleted

## 2020-04-12 NOTE — Telephone Encounter (Signed)
I left Mr. Cicero a message asking him to call and schedule an appointment for Diabetic shoe measurement.  If his foot is doing okay, we can reschedule his appointment with Dr. Milinda Pointer from 05/01/20 to 05/08/2020 or 04/24/20.  Liliane Channel / EJ will not be here on 05/01/2020.

## 2020-04-13 ENCOUNTER — Inpatient Hospital Stay (HOSPITAL_COMMUNITY): Admission: RE | Admit: 2020-04-13 | Payer: Medicare Other | Source: Ambulatory Visit

## 2020-04-15 ENCOUNTER — Encounter (HOSPITAL_BASED_OUTPATIENT_CLINIC_OR_DEPARTMENT_OTHER)
Admission: RE | Admit: 2020-04-15 | Discharge: 2020-04-15 | Disposition: A | Discharge status: Home / Self Care | Payer: Medicare Other | Source: Ambulatory Visit | Attending: Otolaryngology | Admitting: Otolaryngology

## 2020-04-15 ENCOUNTER — Other Ambulatory Visit (HOSPITAL_COMMUNITY)
Admission: RE | Admit: 2020-04-15 | Discharge: 2020-04-15 | Disposition: A | Discharge status: Home / Self Care | Payer: Medicare Other | Source: Ambulatory Visit | Attending: Otolaryngology | Admitting: Otolaryngology

## 2020-04-15 DIAGNOSIS — Z01812 Encounter for preprocedural laboratory examination: Secondary | ICD-10-CM | POA: Insufficient documentation

## 2020-04-15 DIAGNOSIS — Z20822 Contact with and (suspected) exposure to covid-19: Secondary | ICD-10-CM | POA: Insufficient documentation

## 2020-04-15 LAB — BASIC METABOLIC PANEL
Anion gap: 12 (ref 5–15)
BUN: 7 mg/dL — ABNORMAL LOW (ref 8–23)
CO2: 26 mmol/L (ref 22–32)
Calcium: 8.8 mg/dL — ABNORMAL LOW (ref 8.9–10.3)
Chloride: 98 mmol/L (ref 98–111)
Creatinine, Ser: 0.74 mg/dL (ref 0.61–1.24)
GFR, Estimated: >60 mL/min (ref >60)
Glucose, Bld: 294 mg/dL — ABNORMAL HIGH (ref 70–99)
Potassium: 5 mmol/L (ref 3.5–5.1)
Sodium: 136 mmol/L (ref 135–145)

## 2020-04-15 NOTE — Progress Notes (Signed)
Howard Maxwell, pt states had "big breakfast and forgot to take medication today", Dr. Elgie Congo aware and will proceed with surgery as scheduled.

## 2020-04-16 ENCOUNTER — Other Ambulatory Visit: Payer: Self-pay | Admitting: Otolaryngology

## 2020-04-16 LAB — SARS CORONAVIRUS 2 (TAT 6-24 HRS): SARS Coronavirus 2: NEGATIVE

## 2020-04-17 ENCOUNTER — Encounter (HOSPITAL_BASED_OUTPATIENT_CLINIC_OR_DEPARTMENT_OTHER): Payer: Self-pay | Admitting: Otolaryngology

## 2020-04-17 ENCOUNTER — Ambulatory Visit (HOSPITAL_BASED_OUTPATIENT_CLINIC_OR_DEPARTMENT_OTHER): Payer: Medicare Other | Admitting: Anesthesiology

## 2020-04-17 ENCOUNTER — Ambulatory Visit (HOSPITAL_BASED_OUTPATIENT_CLINIC_OR_DEPARTMENT_OTHER)
Admission: RE | Admit: 2020-04-17 | Discharge: 2020-04-17 | Disposition: A | Discharge status: Home / Self Care | Payer: Medicare Other | Attending: Otolaryngology | Admitting: Otolaryngology

## 2020-04-17 ENCOUNTER — Other Ambulatory Visit: Payer: Self-pay

## 2020-04-17 ENCOUNTER — Encounter (HOSPITAL_BASED_OUTPATIENT_CLINIC_OR_DEPARTMENT_OTHER)
Admission: RE | Disposition: A | Discharge status: Home / Self Care | Payer: Self-pay | Source: Home / Self Care | Attending: Otolaryngology

## 2020-04-17 DIAGNOSIS — Z79899 Other long term (current) drug therapy: Secondary | ICD-10-CM | POA: Diagnosis not present

## 2020-04-17 DIAGNOSIS — G4733 Obstructive sleep apnea (adult) (pediatric): Secondary | ICD-10-CM | POA: Insufficient documentation

## 2020-04-17 DIAGNOSIS — Z7984 Long term (current) use of oral hypoglycemic drugs: Secondary | ICD-10-CM | POA: Insufficient documentation

## 2020-04-17 DIAGNOSIS — Z888 Allergy status to other drugs, medicaments and biological substances status: Secondary | ICD-10-CM | POA: Insufficient documentation

## 2020-04-17 HISTORY — PX: DRUG INDUCED ENDOSCOPY: SHX6808

## 2020-04-17 LAB — GLUCOSE, CAPILLARY
Glucose-Capillary: 213 mg/dL — ABNORMAL HIGH (ref 70–99)
Glucose-Capillary: 200 mg/dL — ABNORMAL HIGH (ref 70–99)

## 2020-04-17 SURGERY — DRUG INDUCED SLEEP ENDOSCOPY
Anesthesia: Monitor Anesthesia Care | Site: Nose

## 2020-04-17 MED ORDER — FENTANYL CITRATE (PF) 100 MCG/2ML IJ SOLN
25.0000 ug | INTRAMUSCULAR | Status: DC | PRN
Start: 2020-04-17 — End: 2020-04-17

## 2020-04-17 MED ORDER — OXYMETAZOLINE HCL 0.05 % NA SOLN
NASAL | Status: DC | PRN
  Administered 2020-04-17: 1 via TOPICAL

## 2020-04-17 MED ORDER — ONDANSETRON HCL 4 MG/2ML IJ SOLN
INTRAMUSCULAR | Status: DC | PRN
  Administered 2020-04-17: 4 mg via INTRAVENOUS

## 2020-04-17 MED ORDER — ACETAMINOPHEN 500 MG PO TABS
ORAL_TABLET | ORAL | Status: AC
  Filled 2020-04-17: qty 2

## 2020-04-17 MED ORDER — ACETAMINOPHEN 500 MG PO TABS
1000.0000 mg | ORAL_TABLET | Freq: Once | ORAL | Status: AC
  Administered 2020-04-17: 1000 mg via ORAL

## 2020-04-17 MED ORDER — LACTATED RINGERS IV SOLN: INTRAVENOUS | Status: DC

## 2020-04-17 MED ORDER — AMISULPRIDE (ANTIEMETIC) 5 MG/2ML IV SOLN: 10.0000 mg | Freq: Once | INTRAVENOUS | Status: DC | PRN

## 2020-04-17 MED ORDER — PROPOFOL 500 MG/50ML IV EMUL
INTRAVENOUS | Status: DC | PRN
  Administered 2020-04-17: 35 ug via INTRAVENOUS

## 2020-04-17 MED ORDER — OXYMETAZOLINE HCL 0.05 % NA SOLN
NASAL | Status: AC
  Filled 2020-04-17: qty 30

## 2020-04-17 SURGICAL SUPPLY — 15 items
CANISTER SUCT 1200ML W/VALVE (MISCELLANEOUS) ×2 IMPLANT
COVER WAND RF STERILE (DRAPES) IMPLANT
GLOVE BIO SURGEON STRL SZ7.5 (GLOVE) ×2 IMPLANT
GLOVE SURG UNDER POLY LF SZ7 (GLOVE) ×1 IMPLANT
KIT CLEAN ENDO (MISCELLANEOUS) ×2 IMPLANT
NDL PRECISIONGLIDE 27X1.5 (NEEDLE) IMPLANT
NEEDLE PRECISIONGLIDE 27X1.5 (NEEDLE) IMPLANT
PACK BASIN DAY SURGERY FS (CUSTOM PROCEDURE TRAY) ×2 IMPLANT
PATTIES SURGICAL .5 X3 (DISPOSABLE) ×2 IMPLANT
SHEET MEDIUM DRAPE 40X70 STRL (DRAPES) ×1 IMPLANT
SOL ANTI FOG 6CC (MISCELLANEOUS) ×1 IMPLANT
SOLUTION ANTI FOG 6CC (MISCELLANEOUS) ×1
SYR CONTROL 10ML LL (SYRINGE) IMPLANT
TOWEL GREEN STERILE FF (TOWEL DISPOSABLE) ×2 IMPLANT
TUBE CONNECTING 20X1/4 (TUBING) ×2 IMPLANT

## 2020-04-17 NOTE — Op Note (Signed)
Preop diagnosis: Obstructive sleep apnea Postop diagnosis: same Procedure: Drug-induced sleep endoscopy Surgeon: Redmond Baseman Anesth: IV sedation Compl: None Findings: There is 75% anterior-posterior collapse at the genu of the velum with 25% side-wall narrowing making him a candidate for hypoglossal nerve stimulator placement.  There is more side-wall narrowing more inferiorly.  There was also anterior-posterior collapse at the tongue base that responded well to a jaw thrust. Description:  After discussing risks, benefits, and alternatives, the patient was brought to the operative suite and placed on the operative table in the supine position.  Anesthesia was induced and the patient was given light sedation to simulate natural sleep. When the proper level was reached, an Afrin-soaked pledget was placed in the right nasal passage for a couple of minutes and then removed.  The fiberoptic laryngoscope was then passed to view the pharynx and larynx.  Findings are noted above and the exam was recorded.  After completion, the scope was removed and the patient was returned to anesthesia for wakeup and was moved to the recovery room in stable condition.

## 2020-04-17 NOTE — Brief Op Note (Signed)
04/17/2020  9:10 AM  PATIENT:  Howard Maxwell  75 y.o. male  PRE-OPERATIVE DIAGNOSIS:  obstructive sleep apnea  POST-OPERATIVE DIAGNOSIS:  obstructive sleep apnea  PROCEDURE:  Procedure(s): DRUG INDUCED ENDOSCOPY (N/A)  SURGEON:  Surgeon(s) and Role:    Melida Quitter, MD - Primary  PHYSICIAN ASSISTANT:   ASSISTANTS: none   ANESTHESIA:   IV sedation  EBL: None  BLOOD ADMINISTERED:none  DRAINS: none   LOCAL MEDICATIONS USED:  NONE  SPECIMEN:  No Specimen  DISPOSITION OF SPECIMEN:  N/A  COUNTS:  YES  TOURNIQUET:  * No tourniquets in log *  DICTATION: .Note written in EPIC  PLAN OF CARE: Discharge to home after PACU  PATIENT DISPOSITION:  PACU - hemodynamically stable.   Delay start of Pharmacological VTE agent (>24hrs) due to surgical blood loss or risk of bleeding: no

## 2020-04-17 NOTE — Anesthesia Preprocedure Evaluation (Signed)
Anesthesia Evaluation  Patient identified by MRN, date of birth, ID band Patient awake    Reviewed: Allergy & Precautions, NPO status , Patient's Chart, lab work & pertinent test results  Airway Mallampati: III  TM Distance: >3 FB Neck ROM: Full    Dental  (+) Dental Advisory Given   Pulmonary sleep apnea , COPD, former smoker,    breath sounds clear to auscultation       Cardiovascular hypertension, Pt. on medications and Pt. on home beta blockers + CAD   Rhythm:Regular Rate:Normal     Neuro/Psych  Neuromuscular disease    GI/Hepatic Neg liver ROS, PUD, GERD  ,  Endo/Other  diabetes, Type 2, Oral Hypoglycemic Agents  Renal/GU negative Renal ROS     Musculoskeletal  (+) Arthritis ,   Abdominal   Peds  Hematology negative hematology ROS (+)   Anesthesia Other Findings   Reproductive/Obstetrics                             Anesthesia Physical Anesthesia Plan  ASA: III  Anesthesia Plan: MAC   Post-op Pain Management:    Induction:   PONV Risk Score and Plan: 1 and Propofol infusion, Ondansetron and Treatment may vary due to age or medical condition  Airway Management Planned: Natural Airway and Nasal Cannula  Additional Equipment: None  Intra-op Plan:   Post-operative Plan:   Informed Consent: I have reviewed the patients History and Physical, chart, labs and discussed the procedure including the risks, benefits and alternatives for the proposed anesthesia with the patient or authorized representative who has indicated his/her understanding and acceptance.       Plan Discussed with: CRNA  Anesthesia Plan Comments:         Anesthesia Quick Evaluation
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
## 2020-04-17 NOTE — Transfer of Care (Signed)
Immediate Anesthesia Transfer of Care Note  Patient: Howard Maxwell  Procedure(s) Performed: DRUG INDUCED ENDOSCOPY (N/A Nose)  Patient Location: PACU  Anesthesia Type:MAC  Level of Consciousness: awake, alert , oriented and patient cooperative  Airway & Oxygen Therapy: Patient Spontanous Breathing and Patient connected to face mask oxygen  Post-op Assessment: Report given to RN and Post -op Vital signs reviewed and stable  Post vital signs: Reviewed and stable  Last Vitals:  Vitals Value Taken Time  BP    Temp    Pulse 59 04/17/20 0913  Resp    SpO2 93 % 04/17/20 0913  Vitals shown include unvalidated device data.  Last Pain:  Vitals:   04/17/20 0744  TempSrc: Oral  PainSc: 4       Patients Stated Pain Goal: 6 (71/16/57 9038)  Complications: No complications documented.

## 2020-04-17 NOTE — Anesthesia Procedure Notes (Signed)
Procedure Name: MAC Date/Time: 04/17/2020 9:05 AM Performed by: Signe Colt, CRNA Pre-anesthesia Checklist: Patient identified, Emergency Drugs available, Patient being monitored, Timeout performed and Suction available Patient Re-evaluated:Patient Re-evaluated prior to induction Oxygen Delivery Method: Simple face mask

## 2020-04-17 NOTE — Discharge Instructions (Signed)

## 2020-04-17 NOTE — Anesthesia Postprocedure Evaluation (Signed)
Anesthesia Post Note  Patient: Howard Maxwell  Procedure(s) Performed: DRUG INDUCED ENDOSCOPY (N/A Nose)     Patient location during evaluation: PACU Anesthesia Type: MAC Level of consciousness: awake and alert Pain management: pain level controlled Vital Signs Assessment: post-procedure vital signs reviewed and stable Respiratory status: spontaneous breathing, nonlabored ventilation, respiratory function stable and patient connected to nasal cannula oxygen Cardiovascular status: stable and blood pressure returned to baseline Postop Assessment: no apparent nausea or vomiting Anesthetic complications: no   No complications documented.  Last Vitals:  Vitals:   04/17/20 0744 04/17/20 0931  BP: 124/63 91/67  Pulse:  (!) 59  Resp: 20 16  Temp: 36.7 C 36.8 C  SpO2: 96% 94%    Last Pain:  Vitals:   04/17/20 0931  TempSrc:   PainSc: 0-No pain                 Tiajuana Amass

## 2020-04-18 ENCOUNTER — Encounter (HOSPITAL_BASED_OUTPATIENT_CLINIC_OR_DEPARTMENT_OTHER): Payer: Self-pay | Admitting: Otolaryngology

## 2020-04-22 NOTE — Telephone Encounter (Signed)
I am calling to see how your foot is doing.  "It's doing really good."  I am also calling about your diabetic shoe measurement.  You're scheduled to see Dr. Milinda Pointer on 05/01/2020.  Rick, the [employer], is not here that day.  He's here on the 05/08/2020.  Can we reschedule you appointment with Dr. Milinda Pointer to 05/08/2020 so you can have both appointments on the same day?  "Well, I want to see how much I will have to pay out of pocket first.  I used to get them for a co-pay of only $10.  Will it cost the same thing?"  I think you will be responsible for 20% but I'm not sure.  "I'll keep my appointment with Dr. Milinda Pointer as is until I can find out how much will be covered."

## 2020-05-01 ENCOUNTER — Encounter: Payer: Self-pay | Admitting: Podiatry

## 2020-05-01 ENCOUNTER — Ambulatory Visit: Payer: Medicare Other | Admitting: Podiatry

## 2020-05-01 ENCOUNTER — Other Ambulatory Visit: Payer: Self-pay

## 2020-05-01 DIAGNOSIS — M79676 Pain in unspecified toe(s): Secondary | ICD-10-CM

## 2020-05-01 DIAGNOSIS — D2372 Other benign neoplasm of skin of left lower limb, including hip: Secondary | ICD-10-CM

## 2020-05-01 DIAGNOSIS — D2371 Other benign neoplasm of skin of right lower limb, including hip: Secondary | ICD-10-CM

## 2020-05-01 DIAGNOSIS — B351 Tinea unguium: Secondary | ICD-10-CM

## 2020-05-01 NOTE — Progress Notes (Signed)
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  Objective: Pulses are palpable +1/4 capillary fill time is immediate neurologic sensorium is diminished per Semmes Weinstein monofilament reactive hyperkeratotic lesion it was ulcerated last visit subthird met left is closed today does demonstrate area of reactive hyperkeratosis once debrided does not demonstrate any open wound.  Otherwise toenails are long thick dystrophic and mycotic.  Assessment: Diabetes mellitus diabetic peripheral neuropathy hammertoe deformity preulcerative lesion  Plan: Discussed etiology pathology conservative versus surgical therapies.  At this point I debrided all reactive hyperkeratotic tissue debrided toenails.  He will follow up with Liliane Channel for custom molded diabetic shoes.

## 2020-05-07 ENCOUNTER — Other Ambulatory Visit: Payer: Self-pay

## 2020-05-07 ENCOUNTER — Ambulatory Visit: Payer: Medicare Other | Admitting: Orthotics

## 2020-05-07 DIAGNOSIS — L84 Corns and callosities: Secondary | ICD-10-CM

## 2020-05-07 DIAGNOSIS — Q828 Other specified congenital malformations of skin: Secondary | ICD-10-CM

## 2020-05-07 DIAGNOSIS — L97521 Non-pressure chronic ulcer of other part of left foot limited to breakdown of skin: Secondary | ICD-10-CM

## 2020-05-07 DIAGNOSIS — E1165 Type 2 diabetes mellitus with hyperglycemia: Secondary | ICD-10-CM

## 2020-05-07 NOTE — Progress Notes (Signed)

## 2020-05-08 ENCOUNTER — Other Ambulatory Visit
Admission: RE | Admit: 2020-05-08 | Discharge: 2020-05-08 | Disposition: A | Discharge status: Home / Self Care | Payer: Medicare Other | Source: Ambulatory Visit | Attending: Gastroenterology | Admitting: Gastroenterology

## 2020-05-08 DIAGNOSIS — Z20822 Contact with and (suspected) exposure to covid-19: Secondary | ICD-10-CM | POA: Insufficient documentation

## 2020-05-08 DIAGNOSIS — Z01812 Encounter for preprocedural laboratory examination: Secondary | ICD-10-CM | POA: Insufficient documentation

## 2020-05-08 LAB — SARS CORONAVIRUS 2 (TAT 6-24 HRS): SARS Coronavirus 2: NEGATIVE

## 2020-05-09 ENCOUNTER — Encounter: Payer: Self-pay | Admitting: *Deleted

## 2020-05-10 ENCOUNTER — Other Ambulatory Visit: Payer: Self-pay

## 2020-05-10 ENCOUNTER — Encounter: Payer: Self-pay | Admitting: *Deleted

## 2020-05-10 ENCOUNTER — Ambulatory Visit: Payer: Medicare Other | Admitting: Certified Registered"

## 2020-05-10 ENCOUNTER — Encounter
Admission: RE | Disposition: A | Discharge status: Home / Self Care | Payer: Self-pay | Source: Home / Self Care | Attending: Gastroenterology

## 2020-05-10 ENCOUNTER — Ambulatory Visit
Admission: RE | Admit: 2020-05-10 | Discharge: 2020-05-10 | Disposition: A | Discharge status: Home / Self Care | Payer: Medicare Other | Attending: Gastroenterology | Admitting: Gastroenterology

## 2020-05-10 DIAGNOSIS — K219 Gastro-esophageal reflux disease without esophagitis: Secondary | ICD-10-CM | POA: Diagnosis present

## 2020-05-10 DIAGNOSIS — E669 Obesity, unspecified: Secondary | ICD-10-CM | POA: Diagnosis not present

## 2020-05-10 DIAGNOSIS — Z7952 Long term (current) use of systemic steroids: Secondary | ICD-10-CM | POA: Insufficient documentation

## 2020-05-10 DIAGNOSIS — Z79899 Other long term (current) drug therapy: Secondary | ICD-10-CM | POA: Diagnosis not present

## 2020-05-10 DIAGNOSIS — E119 Type 2 diabetes mellitus without complications: Secondary | ICD-10-CM | POA: Insufficient documentation

## 2020-05-10 DIAGNOSIS — K2100 Gastro-esophageal reflux disease with esophagitis, without bleeding: Secondary | ICD-10-CM | POA: Diagnosis not present

## 2020-05-10 DIAGNOSIS — Z888 Allergy status to other drugs, medicaments and biological substances status: Secondary | ICD-10-CM | POA: Diagnosis not present

## 2020-05-10 DIAGNOSIS — I2510 Atherosclerotic heart disease of native coronary artery without angina pectoris: Secondary | ICD-10-CM | POA: Diagnosis not present

## 2020-05-10 DIAGNOSIS — K2289 Other specified disease of esophagus: Secondary | ICD-10-CM | POA: Diagnosis not present

## 2020-05-10 DIAGNOSIS — K3189 Other diseases of stomach and duodenum: Secondary | ICD-10-CM | POA: Diagnosis not present

## 2020-05-10 DIAGNOSIS — I252 Old myocardial infarction: Secondary | ICD-10-CM | POA: Diagnosis not present

## 2020-05-10 DIAGNOSIS — K2950 Unspecified chronic gastritis without bleeding: Secondary | ICD-10-CM | POA: Insufficient documentation

## 2020-05-10 DIAGNOSIS — Z6833 Body mass index (BMI) 33.0-33.9, adult: Secondary | ICD-10-CM | POA: Diagnosis not present

## 2020-05-10 DIAGNOSIS — K922 Gastrointestinal hemorrhage, unspecified: Secondary | ICD-10-CM | POA: Diagnosis not present

## 2020-05-10 DIAGNOSIS — I10 Essential (primary) hypertension: Secondary | ICD-10-CM | POA: Diagnosis not present

## 2020-05-10 DIAGNOSIS — Z7984 Long term (current) use of oral hypoglycemic drugs: Secondary | ICD-10-CM | POA: Insufficient documentation

## 2020-05-10 HISTORY — PX: ESOPHAGOGASTRODUODENOSCOPY: SHX5428

## 2020-05-10 LAB — GLUCOSE, CAPILLARY: Glucose-Capillary: 259 mg/dL — ABNORMAL HIGH (ref 70–99)

## 2020-05-10 SURGERY — EGD (ESOPHAGOGASTRODUODENOSCOPY)
Anesthesia: General

## 2020-05-10 MED ORDER — PROPOFOL 10 MG/ML IV BOLUS
INTRAVENOUS | Status: AC
  Filled 2020-05-10: qty 20

## 2020-05-10 MED ORDER — GLYCOPYRROLATE 0.2 MG/ML IJ SOLN
INTRAMUSCULAR | Status: DC | PRN
  Administered 2020-05-10: .2 mg via INTRAVENOUS

## 2020-05-10 MED ORDER — PROPOFOL 500 MG/50ML IV EMUL
INTRAVENOUS | Status: AC
  Filled 2020-05-10: qty 50

## 2020-05-10 MED ORDER — LIDOCAINE HCL (CARDIAC) PF 100 MG/5ML IV SOSY
PREFILLED_SYRINGE | INTRAVENOUS | Status: DC | PRN
  Administered 2020-05-10: 100 mg via INTRAVENOUS

## 2020-05-10 MED ORDER — SODIUM CHLORIDE 0.9 % IV SOLN: INTRAVENOUS | Status: DC

## 2020-05-10 MED ORDER — PROPOFOL 10 MG/ML IV BOLUS
INTRAVENOUS | Status: DC | PRN
  Administered 2020-05-10: 50 mg via INTRAVENOUS
  Administered 2020-05-10: 100 mg via INTRAVENOUS

## 2020-05-10 NOTE — Interval H&P Note (Signed)
History and Physical Interval Note:  05/10/2020 7:36 AM  Howard Maxwell  has presented today for surgery, with the diagnosis of BARRETT'S ESOPHAGUS.  The various methods of treatment have been discussed with the patient and family. After consideration of risks, benefits and other options for treatment, the patient has consented to  Procedure(s): ESOPHAGOGASTRODUODENOSCOPY (EGD) (N/A) as a surgical intervention.  The patient's history has been reviewed, patient examined, no change in status, stable for surgery.  I have reviewed the patient's chart and labs.  Questions were answered to the patient's satisfaction.     Lesly Rubenstein  Ok to proceed with EGD
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
## 2020-05-10 NOTE — Transfer of Care (Signed)
Immediate Anesthesia Transfer of Care Note  Patient: Howard Maxwell  Procedure(s) Performed: ESOPHAGOGASTRODUODENOSCOPY (EGD) (N/A )  Patient Location: PACU and Endoscopy Unit  Anesthesia Type:General  Level of Consciousness: drowsy  Airway & Oxygen Therapy: Patient Spontanous Breathing  Post-op Assessment: Report given to RN  Post vital signs: stable  Last Vitals:  Vitals Value Taken Time  BP    Temp    Pulse    Resp    SpO2      Last Pain:  Vitals:   05/10/20 0710  TempSrc: Temporal  PainSc: 2          Complications: No complications documented.
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
## 2020-05-10 NOTE — Op Note (Signed)
[facility] Gastroenterology Patient Name: Howard Maxwell Procedure Date: 05/10/2020 7:27 AM MRN: [id number] Account #: [id number] Date of Birth: 02-04-1946 Admit Type: Outpatient Age: 75 Room: [facility] ENDO ROOM 3 Gender: Male Note Status: Finalized Procedure:             Upper GI endoscopy Indications:           Gastro-esophageal reflux disease Providers:             Andrey Farmer MD, MD Referring MD:          Tracie Harrier, MD (Referring MD) Medicines:             Monitored Anesthesia Care Complications:         No immediate complications. Estimated blood loss:                         Minimal. Procedure:             Pre-Anesthesia Assessment:                        - Prior to the procedure, a History and Physical was                         performed, and patient medications and allergies were                         reviewed. The patient is competent. The risks and                         benefits of the procedure and the sedation options and                         risks were discussed with the patient. All questions                         were answered and informed consent was obtained.                         Patient identification and proposed procedure were                         verified by the physician, the nurse, the anesthetist                         and the technician in the endoscopy suite. Mental                         Status Examination: alert and oriented. Airway                         Examination: normal oropharyngeal airway and neck                         mobility. Respiratory Examination: clear to                         auscultation. CV Examination: normal. Prophylactic  Antibiotics: The patient does not require prophylactic                         antibiotics. Prior Anticoagulants: The patient has                         taken no previous anticoagulant or antiplatelet                         agents. ASA Grade  Assessment: III - A patient with                         severe systemic disease. After reviewing the risks and                         benefits, the patient was deemed in satisfactory                         condition to undergo the procedure. The anesthesia                         plan was to use monitored anesthesia care (MAC).                         Immediately prior to administration of medications,                         the patient was re-assessed for adequacy to receive                         sedatives. The heart rate, respiratory rate, oxygen                         saturations, blood pressure, adequacy of pulmonary                         ventilation, and response to care were monitored                         throughout the procedure. The physical status of the                         patient was re-assessed after the procedure.                        After obtaining informed consent, the endoscope was                         passed under direct vision. Throughout the procedure,                         the patient's blood pressure, pulse, and oxygen                         saturations were monitored continuously. The Endoscope                         was introduced through the mouth, and advanced to the  second part of duodenum. The upper GI endoscopy was                         accomplished without difficulty. The patient tolerated                         the procedure well. Findings:      There were esophageal mucosal changes suggestive of short-segment       Barrett's esophagus present in the lower third of the esophagus. This       was manifested as a very small island of BE's. The maximum longitudinal       extent of these mucosal changes was 1 cm in length. Mucosa was biopsied       with a cold forceps for histology in a targeted manner in the lower       third of the esophagus. One specimen bottle was sent to pathology.       Estimated blood loss  was minimal.      A very small amount of hematin (altered blood/coffee-ground-like       material) was found in the gastric body. Biopsies were taken with a cold       forceps for Helicobacter pylori testing. Estimated blood loss was       minimal.      The exam of the stomach was otherwise normal.      Patchy mildly erythematous mucosa without active bleeding and with no       stigmata of bleeding was found in the duodenal bulb. Impression:            - Esophageal mucosal changes suggestive of                         short-segment Barrett's esophagus. Biopsied.                        - Hematin (altered blood/coffee-ground-like material)                         in the gastric body. Biopsied.                        - Erythematous duodenopathy. Recommendation:        - Discharge patient to home.                        - Resume previous diet.                        - Continue present medications.                        - Await pathology results.                        - Return to referring physician as previously                         scheduled. Procedure Code(s):     --- Professional ---                        [phone], Esophagogastroduodenoscopy, flexible,  transoral; with biopsy, single or multiple Diagnosis Code(s):     --- Professional ---                        K22.8, Other specified diseases of esophagus                        K92.2, Gastrointestinal hemorrhage, unspecified                        K31.89, Other diseases of stomach and duodenum                        K21.9, Gastro-esophageal reflux disease without                         esophagitis CPT copyright 2019 [employer]. All rights reserved. The codes documented in this report are preliminary and upon coder review may  be revised to meet current compliance requirements. Andrey Farmer MD, MD 05/10/2020 8:05:12 AM Number of Addenda: 0 Note Initiated On: 05/10/2020 7:27 AM Estimated  Blood Loss:  Estimated blood loss was minimal.      [facility]

## 2020-05-12 NOTE — Anesthesia Postprocedure Evaluation (Signed)
Anesthesia Post Note  Patient: Howard Maxwell  Procedure(s) Performed: ESOPHAGOGASTRODUODENOSCOPY (EGD) (N/A )  Patient location during evaluation: PACU Anesthesia Type: General Level of consciousness: awake and alert Pain management: pain level controlled Vital Signs Assessment: post-procedure vital signs reviewed and stable Respiratory status: spontaneous breathing, nonlabored ventilation, respiratory function stable and patient connected to nasal cannula oxygen Cardiovascular status: blood pressure returned to baseline and stable Postop Assessment: no apparent nausea or vomiting Anesthetic complications: no   No complications documented.   Last Vitals:  Vitals:   05/10/20 0811 05/10/20 0823  BP: 139/85 137/82  Pulse: 67 64  Resp: 17 20  Temp:    SpO2: 94% 94%    Last Pain:  Vitals:   05/11/20 1432  TempSrc:   PainSc: 0-No pain                 Molli Barrows

## 2020-05-13 ENCOUNTER — Encounter: Payer: Self-pay | Admitting: Gastroenterology

## 2020-05-13 LAB — SURGICAL PATHOLOGY

## 2020-05-15 ENCOUNTER — Other Ambulatory Visit: Payer: Self-pay | Admitting: Otolaryngology
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
## 2020-05-22 ENCOUNTER — Encounter: Payer: Self-pay | Admitting: Pain Medicine

## 2020-05-22 ENCOUNTER — Other Ambulatory Visit: Payer: Self-pay

## 2020-05-22 ENCOUNTER — Ambulatory Visit: Payer: Medicare Other | Attending: Pain Medicine | Admitting: Pain Medicine

## 2020-05-22 VITALS — BP 126/74 | HR 72 | Temp 97.3°F | Resp 18 | Ht 70.0 in | Wt 236.0 lb

## 2020-05-22 DIAGNOSIS — M79602 Pain in left arm: Secondary | ICD-10-CM | POA: Insufficient documentation

## 2020-05-22 DIAGNOSIS — M545 Low back pain, unspecified: Secondary | ICD-10-CM | POA: Insufficient documentation

## 2020-05-22 DIAGNOSIS — M25559 Pain in unspecified hip: Secondary | ICD-10-CM | POA: Diagnosis not present

## 2020-05-22 DIAGNOSIS — M79601 Pain in right arm: Secondary | ICD-10-CM | POA: Diagnosis not present

## 2020-05-22 DIAGNOSIS — F1120 Opioid dependence, uncomplicated: Secondary | ICD-10-CM | POA: Insufficient documentation

## 2020-05-22 DIAGNOSIS — M5414 Radiculopathy, thoracic region: Secondary | ICD-10-CM | POA: Insufficient documentation

## 2020-05-22 DIAGNOSIS — Z79899 Other long term (current) drug therapy: Secondary | ICD-10-CM | POA: Diagnosis present

## 2020-05-22 DIAGNOSIS — G8929 Other chronic pain: Secondary | ICD-10-CM | POA: Insufficient documentation

## 2020-05-22 DIAGNOSIS — M542 Cervicalgia: Secondary | ICD-10-CM | POA: Diagnosis present

## 2020-05-22 DIAGNOSIS — G894 Chronic pain syndrome: Secondary | ICD-10-CM | POA: Diagnosis not present

## 2020-05-22 MED ORDER — MORPHINE SULFATE ER 15 MG PO TBCR
15.0000 mg | EXTENDED_RELEASE_TABLET | Freq: Two times a day (BID) | ORAL | 0 refills | Status: DC
Start: 2020-05-27 — End: 2020-08-21

## 2020-05-22 MED ORDER — MORPHINE SULFATE ER 15 MG PO TBCR: 15.0000 mg | EXTENDED_RELEASE_TABLET | Freq: Two times a day (BID) | ORAL | 0 refills | Status: DC

## 2020-05-22 NOTE — Patient Instructions (Signed)
____________________________________________________________________________________________  Medication Rules  Purpose: To inform patients, and their family members, of our rules and regulations.  Applies to: All patients receiving prescriptions (written or electronic).  Pharmacy of record: Pharmacy where electronic prescriptions will be sent. If written prescriptions are taken to a different pharmacy, please inform the nursing staff. The pharmacy listed in the electronic medical record should be the one where you would like electronic prescriptions to be sent.  Electronic prescriptions: In compliance with the [place] Strengthen Opioid Misuse Prevention (STOP) Act of 2017 (Session Law 2017-74/H243), effective March 23, 2018, all controlled substances must be electronically prescribed. Calling prescriptions to the pharmacy will cease to exist.  Prescription refills: Only during scheduled appointments. Applies to all prescriptions.  NOTE: The following applies primarily to controlled substances (Opioid* Pain Medications).   Type of encounter (visit): For patients receiving controlled substances, face-to-face visits are required. (Not an option or up to the patient.)  Patient's responsibilities: 1. Pain Pills: Bring all pain pills to every appointment (except for procedure appointments). 2. Pill Bottles: Bring pills in original pharmacy bottle. Always bring the newest bottle. Bring bottle, even if empty. 3. Medication refills: You are responsible for knowing and keeping track of what medications you take and those you need refilled. The day before your appointment: write a list of all prescriptions that need to be refilled. The day of the appointment: give the list to the admitting nurse. Prescriptions will be written only during appointments. No prescriptions will be written on procedure days. If you forget a medication: it will not be "Called in", "Faxed", or "electronically sent".  You will need to get another appointment to get these prescribed. No early refills. Do not call asking to have your prescription filled early. 4. Prescription Accuracy: You are responsible for carefully inspecting your prescriptions before leaving our office. Have the discharge nurse carefully go over each prescription with you, before taking them home. Make sure that your name is accurately spelled, that your address is correct. Check the name and dose of your medication to make sure it is accurate. Check the number of pills, and the written instructions to make sure they are clear and accurate. Make sure that you are given enough medication to last until your next medication refill appointment. 5. Taking Medication: Take medication as prescribed. When it comes to controlled substances, taking less pills or less frequently than prescribed is permitted and encouraged. Never take more pills than instructed. Never take medication more frequently than prescribed.  6. Inform other Doctors: Always inform, all of your healthcare providers, of all the medications you take. 7. Pain Medication from other Providers: You are not allowed to accept any additional pain medication from any other Doctor or Healthcare provider. There are two exceptions to this rule. (see below) In the event that you require additional pain medication, you are responsible for notifying us, as stated below. 8. Cough Medicine: Often these contain an opioid, such as codeine or hydrocodone. Never accept or take cough medicine containing these opioids if you are already taking an opioid* medication. The combination may cause respiratory failure and death. 9. Medication Agreement: You are responsible for carefully reading and following our Medication Agreement. This must be signed before receiving any prescriptions from our practice. Safely store a copy of your signed Agreement. Violations to the Agreement will result in no further prescriptions.  (Additional copies of our Medication Agreement are available upon request.) 10. Laws, Rules, & Regulations: All patients are expected to follow all   Federal and State Laws, Statutes, Rules, & Regulations. Ignorance of the Laws does not constitute a valid excuse.  11. Illegal drugs and Controlled Substances: The use of illegal substances (including, but not limited to marijuana and its derivatives) and/or the illegal use of any controlled substances is strictly prohibited. Violation of this rule may result in the immediate and permanent discontinuation of any and all prescriptions being written by our practice. The use of any illegal substances is prohibited. 12. Adopted CDC guidelines & recommendations: Target dosing levels will be at or below 60 MME/day. Use of benzodiazepines** is not recommended.  Exceptions: There are only two exceptions to the rule of not receiving pain medications from other Healthcare Providers. 1. Exception #1 (Emergencies): In the event of an emergency (i.e.: accident requiring emergency care), you are allowed to receive additional pain medication. However, you are responsible for: As soon as you are able, call our office [phone], at any time of the day or night, and leave a message stating your name, the date and nature of the emergency, and the name and dose of the medication prescribed. In the event that your call is answered by a member of our staff, make sure to document and save the date, time, and the name of the person that took your information.  2. Exception #2 (Planned Surgery): In the event that you are scheduled by another doctor or dentist to have any type of surgery or procedure, you are allowed (for a period no longer than 30 days), to receive additional pain medication, for the acute post-op pain. However, in this case, you are responsible for picking up a copy of our "Post-op Pain Management for Surgeons" handout, and giving it to your surgeon or dentist. This  document is available at our office, and does not require an appointment to obtain it. Simply go to our office during business hours (Monday-Thursday from 8:00 AM to 4:00 PM) (Friday 8:00 AM to 12:00 Noon) or if you have a scheduled appointment with us, prior to your surgery, and ask for it by name. In addition, you are responsible for: calling our office [phone], at any time of the day or night, and leaving a message stating your name, name of your surgeon, type of surgery, and date of procedure or surgery. Failure to comply with your responsibilities may result in termination of therapy involving the controlled substances.  *Opioid medications include: morphine, codeine, oxycodone, oxymorphone, hydrocodone, hydromorphone, meperidine, tramadol, tapentadol, buprenorphine, fentanyl, methadone. **Benzodiazepine medications include: diazepam (Valium), alprazolam (Xanax), clonazepam (Klonopine), lorazepam (Ativan), clorazepate (Tranxene), chlordiazepoxide (Librium), estazolam (Prosom), oxazepam (Serax), temazepam (Restoril), triazolam (Halcion) (Last updated: 02/19/2020) ____________________________________________________________________________________________   ____________________________________________________________________________________________  Medication Recommendations and Reminders  Applies to: All patients receiving prescriptions (written and/or electronic).  Medication Rules & Regulations: These rules and regulations exist for your safety and that of others. They are not flexible and neither are we. Dismissing or ignoring them will be considered "non-compliance" with medication therapy, resulting in complete and irreversible termination of such therapy. (See document titled "Medication Rules" for more details.) In all conscience, because of safety reasons, we cannot continue providing a therapy where the patient does not follow instructions.  Pharmacy of record:   Definition:  This is the pharmacy where your electronic prescriptions will be sent.   We do not endorse any particular pharmacy, however, we have experienced problems with [facility] not securing enough medication supply for the community.  We do not restrict you in your choice of pharmacy. However,   once we write for your prescriptions, we will NOT be re-sending more prescriptions to fix restricted supply problems created by your pharmacy, or your insurance.   The pharmacy listed in the electronic medical record should be the one where you want electronic prescriptions to be sent.  If you choose to change pharmacy, simply notify our nursing staff.  Recommendations:  Keep all of your pain medications in a safe place, under lock and key, even if you live alone. We will NOT replace lost, stolen, or damaged medication.  After you fill your prescription, take 1 week's worth of pills and put them away in a safe place. You should keep a separate, properly labeled bottle for this purpose. The remainder should be kept in the original bottle. Use this as your primary supply, until it runs out. Once it's gone, then you know that you have 1 week's worth of medicine, and it is time to come in for a prescription refill. If you do this correctly, it is unlikely that you will ever run out of medicine.  To make sure that the above recommendation works, it is very important that you make sure your medication refill appointments are scheduled at least 1 week before you run out of medicine. To do this in an effective manner, make sure that you do not leave the office without scheduling your next medication management appointment. Always ask the nursing staff to show you in your prescription , when your medication will be running out. Then arrange for the receptionist to get you a return appointment, at least 7 days before you run out of medicine. Do not wait until you have 1 or 2 pills left, to come in. This is very poor planning and  does not take into consideration that we may need to cancel appointments due to bad weather, sickness, or emergencies affecting our staff.  DO NOT ACCEPT A "Partial Fill": If for any reason your pharmacy does not have enough pills/tablets to completely fill or refill your prescription, do not allow for a "partial fill". The law allows the pharmacy to complete that prescription within 72 hours, without requiring a new prescription. If they do not fill the rest of your prescription within those 72 hours, you will need a separate prescription to fill the remaining amount, which we will NOT provide. If the reason for the partial fill is your insurance, you will need to talk to the pharmacist about payment alternatives for the remaining tablets, but again, DO NOT ACCEPT A PARTIAL FILL, unless you can trust your pharmacist to obtain the remainder of the pills within 72 hours.  Prescription refills and/or changes in medication(s):   Prescription refills, and/or changes in dose or medication, will be conducted only during scheduled medication management appointments. (Applies to both, written and electronic prescriptions.)  No refills on procedure days. No medication will be changed or started on procedure days. No changes, adjustments, and/or refills will be conducted on a procedure day. Doing so will interfere with the diagnostic portion of the procedure.  No phone refills. No medications will be "called into the pharmacy".  No Fax refills.  No weekend refills.  No Holliday refills.  No after hours refills.  Remember:  Business hours are:  Monday to Thursday 8:00 AM to 4:00 PM Provider's Schedule: Brennon Otterness, MD - Appointments are:  Medication management: Monday and Wednesday 8:00 AM to 4:00 PM Procedure day: Tuesday and Thursday 7:30 AM to 4:00 PM Bilal Lateef, MD - Appointments are:    Medication management: Tuesday and Thursday 8:00 AM to 4:00 PM Procedure day: Monday and Wednesday  7:30 AM to 4:00 PM (Last update: 10/11/2019) ____________________________________________________________________________________________   ____________________________________________________________________________________________  CBD (cannabidiol) WARNING  Applicable to: All individuals currently taking or considering taking CBD (cannabidiol) and, more important, all patients taking opioid analgesic controlled substances (pain medication). (Example: oxycodone; oxymorphone; hydrocodone; hydromorphone; morphine; methadone; tramadol; tapentadol; fentanyl; buprenorphine; butorphanol; dextromethorphan; meperidine; codeine; etc.)  Legal status: CBD remains a Schedule I drug prohibited for any use. CBD is illegal with one exception. In the [place], CBD has a limited Food and Drug Administration (FDA) approval for the treatment of two specific types of epilepsy disorders. Only one CBD product has been approved by the FDA for this purpose: "Epidiolex". FDA is aware that some companies are marketing products containing cannabis and cannabis-derived compounds in ways that violate the Federal Food, Drug and Cosmetic Act (FD&C Act) and that may put the health and safety of consumers at risk. The FDA, a Federal agency, has not enforced the CBD status since 2018.   Legality: Some manufacturers ship CBD products nationally, which is illegal. Often such products are sold online and are therefore available throughout the country. CBD is openly sold in head shops and health food stores in some states where such sales have not been explicitly legalized. Selling unapproved products with unsubstantiated therapeutic claims is not only a violation of the law, but also can put patients at risk, as these products have not been proven to be safe or effective. Federal illegality makes it difficult to conduct research on CBD.  Reference: "FDA Regulation of Cannabis and Cannabis-Derived Products, Including Cannabidiol  (CBD)" - https://www.fda.gov/news-events/public-health-focus/fda-regulation-cannabis-and-cannabis-derived-products-including-cannabidiol-cbd  Warning: CBD is not FDA approved and has not undergo the same manufacturing controls as prescription drugs.  This means that the purity and safety of available CBD may be questionable. Most of the time, despite manufacturer's claims, it is contaminated with THC (delta-9-tetrahydrocannabinol - the chemical in marijuana responsible for the "HIGH").  When this is the case, the THC contaminant will trigger a positive urine drug screen (UDS) test for Marijuana (carboxy-THC). Because a positive UDS for any illicit substance is a violation of our medication agreement, your opioid analgesics (pain medicine) may be permanently discontinued.  MORE ABOUT CBD  General Information: CBD  is a derivative of the Marijuana (cannabis sativa) plant discovered in 1940. It is one of the 113 identified substances found in Marijuana. It accounts for up to 40% of the plant's extract. As of 2018, preliminary clinical studies on CBD included research for the treatment of anxiety, movement disorders, and pain. CBD is available and consumed in multiple forms, including inhalation of smoke or vapor, as an aerosol spray, and by mouth. It may be supplied as an oil containing CBD, capsules, dried cannabis, or as a liquid solution. CBD is thought not to be as psychoactive as THC (delta-9-tetrahydrocannabinol - the chemical in marijuana responsible for the "HIGH"). Studies suggest that CBD may interact with different biological target receptors in the body, including cannabinoid and other neurotransmitter receptors. As of 2018 the mechanism of action for its biological effects has not been determined.  Side-effects  Adverse reactions: Dry mouth, diarrhea, decreased appetite, fatigue, drowsiness, malaise, weakness, sleep disturbances, and others.  Drug interactions: CBC may interact with other  medications such as blood-thinners. (Last update: 10/28/2019) ____________________________________________________________________________________________   ____________________________________________________________________________________________  Drug Holidays (Slow)  What is a "Drug Holiday"? Drug Holiday: is the name given to the period of time during   which a patient stops taking a medication(s) for the purpose of eliminating tolerance to the drug.  Benefits . Improved effectiveness of opioids. . Decreased opioid dose needed to achieve benefits. . Improved pain with lesser dose.  What is tolerance? Tolerance: is the progressive decreased in effectiveness of a drug due to its repetitive use. With repetitive use, the body gets use to the medication and as a consequence, it loses its effectiveness. This is a common problem seen with opioid pain medications. As a result, a larger dose of the drug is needed to achieve the same effect that used to be obtained with a smaller dose.  How long should a "Drug Holiday" last? You should stay off of the pain medicine for at least 14 consecutive days. (2 weeks)  Should I stop the medicine "cold turkey"? No. You should always coordinate with your Pain Specialist so that he/she can provide you with the correct medication dose to make the transition as smoothly as possible.  How do I stop the medicine? Slowly. You will be instructed to decrease the daily amount of pills that you take by one (1) pill every seven (7) days. This is called a "slow downward taper" of your dose. For example: if you normally take four (4) pills per day, you will be asked to drop this dose to three (3) pills per day for seven (7) days, then to two (2) pills per day for seven (7) days, then to one (1) per day for seven (7) days, and at the end of those last seven (7) days, this is when the "Drug Holiday" would start.   Will I have withdrawals? By doing a "slow downward  taper" like this one, it is unlikely that you will experience any significant withdrawal symptoms. Typically, what triggers withdrawals is the sudden stop of a high dose opioid therapy. Withdrawals can usually be avoided by slowly decreasing the dose over a prolonged period of time. If you do not follow these instructions and decide to stop your medication abruptly, withdrawals may be possible.  What are withdrawals? Withdrawals: refers to the wide range of symptoms that occur after stopping or dramatically reducing opiate drugs after heavy and prolonged use. Withdrawal symptoms do not occur to patients that use low dose opioids, or those who take the medication sporadically. Contrary to benzodiazepine (example: Valium, Xanax, etc.) or alcohol withdrawals ("Delirium Tremens"), opioid withdrawals are not lethal. Withdrawals are the physical manifestation of the body getting rid of the excess receptors.  Expected Symptoms Early symptoms of withdrawal may include: . Agitation . Anxiety . Muscle aches . Increased tearing . Insomnia . Runny nose . Sweating . Yawning  Late symptoms of withdrawal may include: . Abdominal cramping . Diarrhea . Dilated pupils . Goose bumps . Nausea . Vomiting  Will I experience withdrawals? Due to the slow nature of the taper, it is very unlikely that you will experience any.  What is a slow taper? Taper: refers to the gradual decrease in dose.  (Last update: 10/11/2019) ____________________________________________________________________________________________     

## 2020-05-22 NOTE — Progress Notes (Signed)
Nursing Pain Medication Assessment:  Safety precautions to be maintained throughout the outpatient stay will include: orient to surroundings, keep bed in low position, maintain call bell within reach at all times, provide assistance with transfer out of bed and ambulation.  Medication Inspection Compliance: Pill count conducted under aseptic conditions, in front of the patient. Neither the pills nor the bottle was removed from the patient's sight at any time. Once count was completed pills were immediately returned to the patient in their original bottle.  Medication: Morphine ER (MSContin) Pill/Patch Count: 9 of 60 pills remain Pill/Patch Appearance: Markings consistent with prescribed medication Bottle Appearance: Standard pharmacy container. Clearly labeled. Filled Date: 02 / 05 / 2022 Last Medication intake:  Today

## 2020-05-27 ENCOUNTER — Encounter: Payer: Medicare Other | Admitting: Pain Medicine

## 2020-05-28 DIAGNOSIS — Z970 Presence of artificial eye: Secondary | ICD-10-CM | POA: Insufficient documentation

## 2020-05-29 ENCOUNTER — Other Ambulatory Visit: Payer: Medicare Other

## 2020-06-05 ENCOUNTER — Other Ambulatory Visit: Payer: Medicare Other

## 2020-06-10 ENCOUNTER — Ambulatory Visit (INDEPENDENT_AMBULATORY_CARE_PROVIDER_SITE_OTHER): Payer: Medicare Other | Admitting: *Deleted

## 2020-06-10 ENCOUNTER — Other Ambulatory Visit: Payer: Self-pay

## 2020-06-10 DIAGNOSIS — E1165 Type 2 diabetes mellitus with hyperglycemia: Secondary | ICD-10-CM | POA: Diagnosis not present

## 2020-06-10 DIAGNOSIS — L84 Corns and callosities: Secondary | ICD-10-CM

## 2020-06-10 DIAGNOSIS — L97521 Non-pressure chronic ulcer of other part of left foot limited to breakdown of skin: Secondary | ICD-10-CM | POA: Diagnosis not present

## 2020-06-10 NOTE — Progress Notes (Signed)
Patient presents today to pick up diabetic shoes and insoles.  Patient was dispensed diabetic shoes and 3 pairs of foam casted diabetic insoles. Fit was satisfactory. Instructions for break-in and wear was reviewed and a copy was given to the patient.   Re-appointment for regularly scheduled diabetic foot care visits or if they should experience any trouble with the shoes or insoles.

## 2020-06-19 ENCOUNTER — Other Ambulatory Visit: Payer: Self-pay

## 2020-06-19 ENCOUNTER — Encounter (HOSPITAL_BASED_OUTPATIENT_CLINIC_OR_DEPARTMENT_OTHER): Payer: Self-pay | Admitting: Otolaryngology

## 2020-06-24 ENCOUNTER — Other Ambulatory Visit (HOSPITAL_COMMUNITY)
Admission: RE | Admit: 2020-06-24 | Discharge: 2020-06-24 | Disposition: A | Discharge status: Home / Self Care | Payer: Medicare Other | Source: Ambulatory Visit | Attending: Otolaryngology | Admitting: Otolaryngology

## 2020-06-24 ENCOUNTER — Encounter (HOSPITAL_BASED_OUTPATIENT_CLINIC_OR_DEPARTMENT_OTHER)
Admission: RE | Admit: 2020-06-24 | Discharge: 2020-06-24 | Disposition: A | Discharge status: Home / Self Care | Payer: Medicare Other | Source: Ambulatory Visit | Attending: Otolaryngology | Admitting: Otolaryngology

## 2020-06-24 DIAGNOSIS — Z01812 Encounter for preprocedural laboratory examination: Secondary | ICD-10-CM | POA: Insufficient documentation

## 2020-06-24 DIAGNOSIS — Z20822 Contact with and (suspected) exposure to covid-19: Secondary | ICD-10-CM | POA: Insufficient documentation

## 2020-06-24 LAB — BASIC METABOLIC PANEL
Anion gap: 8 (ref 5–15)
BUN: 8 mg/dL (ref 8–23)
CO2: 25 mmol/L (ref 22–32)
Calcium: 9.3 mg/dL (ref 8.9–10.3)
Chloride: 103 mmol/L (ref 98–111)
Creatinine, Ser: 0.68 mg/dL (ref 0.61–1.24)
GFR, Estimated: >60 mL/min (ref >60)
Glucose, Bld: 188 mg/dL — ABNORMAL HIGH (ref 70–99)
Potassium: 4.9 mmol/L (ref 3.5–5.1)
Sodium: 136 mmol/L (ref 135–145)

## 2020-06-24 LAB — SARS CORONAVIRUS 2 (TAT 6-24 HRS): SARS Coronavirus 2: NEGATIVE

## 2020-06-26 ENCOUNTER — Ambulatory Visit (HOSPITAL_COMMUNITY)
Admission: RE | Admit: 2020-06-26 | Discharge: 2020-06-26 | Disposition: A | Discharge status: Home / Self Care | Payer: Medicare Other | Source: Home / Self Care | Attending: Otolaryngology | Admitting: Otolaryngology

## 2020-06-26 ENCOUNTER — Ambulatory Visit (HOSPITAL_BASED_OUTPATIENT_CLINIC_OR_DEPARTMENT_OTHER): Payer: Medicare Other | Admitting: Certified Registered"

## 2020-06-26 ENCOUNTER — Encounter (HOSPITAL_BASED_OUTPATIENT_CLINIC_OR_DEPARTMENT_OTHER)
Admission: RE | Disposition: A | Discharge status: Home / Self Care | Payer: Self-pay | Source: Home / Self Care | Attending: Otolaryngology

## 2020-06-26 ENCOUNTER — Encounter (HOSPITAL_BASED_OUTPATIENT_CLINIC_OR_DEPARTMENT_OTHER): Payer: Self-pay | Admitting: Otolaryngology

## 2020-06-26 ENCOUNTER — Ambulatory Visit (HOSPITAL_BASED_OUTPATIENT_CLINIC_OR_DEPARTMENT_OTHER)
Admission: RE | Admit: 2020-06-26 | Discharge: 2020-06-26 | Disposition: A | Discharge status: Home / Self Care | Payer: Medicare Other | Attending: Otolaryngology | Admitting: Otolaryngology

## 2020-06-26 ENCOUNTER — Other Ambulatory Visit: Payer: Self-pay

## 2020-06-26 DIAGNOSIS — Z79899 Other long term (current) drug therapy: Secondary | ICD-10-CM | POA: Diagnosis not present

## 2020-06-26 DIAGNOSIS — Z87891 Personal history of nicotine dependence: Secondary | ICD-10-CM | POA: Diagnosis not present

## 2020-06-26 DIAGNOSIS — Z888 Allergy status to other drugs, medicaments and biological substances status: Secondary | ICD-10-CM | POA: Diagnosis not present

## 2020-06-26 DIAGNOSIS — I517 Cardiomegaly: Secondary | ICD-10-CM | POA: Insufficient documentation

## 2020-06-26 DIAGNOSIS — G4733 Obstructive sleep apnea (adult) (pediatric): Secondary | ICD-10-CM

## 2020-06-26 DIAGNOSIS — Z7984 Long term (current) use of oral hypoglycemic drugs: Secondary | ICD-10-CM | POA: Diagnosis not present

## 2020-06-26 DIAGNOSIS — Z79891 Long term (current) use of opiate analgesic: Secondary | ICD-10-CM | POA: Insufficient documentation

## 2020-06-26 HISTORY — PX: IMPLANTATION OF HYPOGLOSSAL NERVE STIMULATOR: SHX6827

## 2020-06-26 LAB — GLUCOSE, CAPILLARY
Glucose-Capillary: 172 mg/dL — ABNORMAL HIGH (ref 70–99)
Glucose-Capillary: 162 mg/dL — ABNORMAL HIGH (ref 70–99)

## 2020-06-26 SURGERY — INSERTION, HYPOGLOSSAL NERVE STIMULATOR
Anesthesia: General | Site: Chest | Laterality: Right

## 2020-06-26 MED ORDER — LIDOCAINE 2% (20 MG/ML) 5 ML SYRINGE
INTRAMUSCULAR | Status: DC | PRN
  Administered 2020-06-26: 100 mg via INTRAVENOUS

## 2020-06-26 MED ORDER — PROPOFOL 500 MG/50ML IV EMUL
INTRAVENOUS | Status: DC | PRN
  Administered 2020-06-26: 35 ug/kg/min via INTRAVENOUS

## 2020-06-26 MED ORDER — PROPOFOL 10 MG/ML IV BOLUS
INTRAVENOUS | Status: DC | PRN
  Administered 2020-06-26: 140 mg via INTRAVENOUS

## 2020-06-26 MED ORDER — LIDOCAINE-EPINEPHRINE 1 %-1:100000 IJ SOLN
INTRAMUSCULAR | Status: DC | PRN
  Administered 2020-06-26: 5 mL

## 2020-06-26 MED ORDER — LIDOCAINE-EPINEPHRINE 1 %-1:100000 IJ SOLN
INTRAMUSCULAR | Status: AC
  Filled 2020-06-26: qty 1

## 2020-06-26 MED ORDER — EPHEDRINE SULFATE 50 MG/ML IJ SOLN
INTRAMUSCULAR | Status: DC | PRN
  Administered 2020-06-26: 5 mg via INTRAVENOUS

## 2020-06-26 MED ORDER — FENTANYL CITRATE (PF) 100 MCG/2ML IJ SOLN
INTRAMUSCULAR | Status: AC
  Filled 2020-06-26: qty 2

## 2020-06-26 MED ORDER — PHENYLEPHRINE HCL-NACL 10-0.9 MG/250ML-% IV SOLN
INTRAVENOUS | Status: DC | PRN
  Administered 2020-06-26: 40 ug/min via INTRAVENOUS

## 2020-06-26 MED ORDER — SUCCINYLCHOLINE CHLORIDE 20 MG/ML IJ SOLN
INTRAMUSCULAR | Status: DC | PRN
  Administered 2020-06-26: 120 mg via INTRAVENOUS

## 2020-06-26 MED ORDER — 0.9 % SODIUM CHLORIDE (POUR BTL) OPTIME
TOPICAL | Status: DC | PRN
  Administered 2020-06-26: 200 mL

## 2020-06-26 MED ORDER — FENTANYL CITRATE (PF) 100 MCG/2ML IJ SOLN
INTRAMUSCULAR | Status: DC | PRN
  Administered 2020-06-26 (×2): 50 ug via INTRAVENOUS

## 2020-06-26 MED ORDER — LACTATED RINGERS IV SOLN: INTRAVENOUS | Status: DC

## 2020-06-26 MED ORDER — DEXAMETHASONE SODIUM PHOSPHATE 4 MG/ML IJ SOLN
INTRAMUSCULAR | Status: DC | PRN
  Administered 2020-06-26: 5 mg via INTRAVENOUS

## 2020-06-26 MED ORDER — ONDANSETRON HCL 4 MG/2ML IJ SOLN
INTRAMUSCULAR | Status: DC | PRN
  Administered 2020-06-26: 4 mg via INTRAVENOUS

## 2020-06-26 MED ORDER — ONDANSETRON HCL 4 MG/2ML IJ SOLN: 4.0000 mg | Freq: Once | INTRAMUSCULAR | Status: DC | PRN

## 2020-06-26 MED ORDER — CEFAZOLIN SODIUM-DEXTROSE 2-4 GM/100ML-% IV SOLN
INTRAVENOUS | Status: AC
  Filled 2020-06-26: qty 100

## 2020-06-26 MED ORDER — ACETAMINOPHEN 500 MG PO TABS
ORAL_TABLET | ORAL | Status: AC
  Filled 2020-06-26: qty 2

## 2020-06-26 MED ORDER — HYDROCODONE-ACETAMINOPHEN 5-325 MG PO TABS: 1.0000 | ORAL_TABLET | Freq: Four times a day (QID) | ORAL | 0 refills | Status: DC | PRN

## 2020-06-26 MED ORDER — FENTANYL CITRATE (PF) 100 MCG/2ML IJ SOLN
25.0000 ug | INTRAMUSCULAR | Status: DC | PRN
  Administered 2020-06-26: 50 ug via INTRAVENOUS
  Administered 2020-06-26: 25 ug via INTRAVENOUS
  Administered 2020-06-26: 50 ug via INTRAVENOUS

## 2020-06-26 MED ORDER — ACETAMINOPHEN 500 MG PO TABS
1000.0000 mg | ORAL_TABLET | Freq: Once | ORAL | Status: AC
  Administered 2020-06-26: 1000 mg via ORAL

## 2020-06-26 MED ORDER — CEFAZOLIN SODIUM-DEXTROSE 2-4 GM/100ML-% IV SOLN
2.0000 g | INTRAVENOUS | Status: AC
  Administered 2020-06-26: 2 g via INTRAVENOUS

## 2020-06-26 SURGICAL SUPPLY — 75 items
ACC NRSTM 4 TRQ WRNCH STRL (MISCELLANEOUS)
ADH SKN CLS APL DERMABOND .7 (GAUZE/BANDAGES/DRESSINGS) ×2
BAG DECANTER FOR FLEXI CONT (MISCELLANEOUS) ×3 IMPLANT
BLADE CLIPPER SURG (BLADE) ×2 IMPLANT
BLADE SURG 15 STRL LF DISP TIS (BLADE) ×1 IMPLANT
BLADE SURG 15 STRL SS (BLADE) ×3
CANISTER SUCT 1200ML W/VALVE (MISCELLANEOUS) ×3 IMPLANT
CORD BIPOLAR FORCEPS 12FT (ELECTRODE) ×3 IMPLANT
COVER PROBE W GEL 5X96 (DRAPES) ×3 IMPLANT
COVER WAND RF STERILE (DRAPES) ×3 IMPLANT
DERMABOND ADVANCED (GAUZE/BANDAGES/DRESSINGS) ×4
DERMABOND ADVANCED .7 DNX12 (GAUZE/BANDAGES/DRESSINGS) ×2 IMPLANT
DRAPE C-ARM 35X43 STRL (DRAPES) ×3 IMPLANT
DRAPE HEAD BAR (DRAPES) IMPLANT
DRAPE INCISE IOBAN 66X45 STRL (DRAPES) ×3 IMPLANT
DRAPE MICROSCOPE WILD 40.5X102 (DRAPES) ×3 IMPLANT
DRAPE UTILITY XL STRL (DRAPES) ×3 IMPLANT
DRSG TEGADERM 2-3/8X2-3/4 SM (GAUZE/BANDAGES/DRESSINGS) ×3 IMPLANT
DRSG TEGADERM 4X4.75 (GAUZE/BANDAGES/DRESSINGS) ×3 IMPLANT
ELECT COATED BLADE 2.86 ST (ELECTRODE) ×3 IMPLANT
ELECT EMG 18 NIMS (NEUROSURGERY SUPPLIES) ×6
ELECT REM PT RETURN 9FT ADLT (ELECTROSURGICAL) ×3
ELECTRODE EMG 18 NIMS (NEUROSURGERY SUPPLIES) ×1 IMPLANT
ELECTRODE REM PT RTRN 9FT ADLT (ELECTROSURGICAL) ×1 IMPLANT
FORCEPS BIPOLAR SPETZLER 8 1.0 (NEUROSURGERY SUPPLIES) ×3 IMPLANT
GAUZE 4X4 16PLY RFD (DISPOSABLE) ×3 IMPLANT
GAUZE SPONGE 4X4 12PLY STRL (GAUZE/BANDAGES/DRESSINGS) ×3 IMPLANT
GENERATOR PULSE INSPIRE (Generator) ×3 IMPLANT
GENERATOR PULSE INSPIRE IV (Generator) ×1 IMPLANT
GLOVE SURG ENC MOIS LTX SZ6.5 (GLOVE) ×2 IMPLANT
GLOVE SURG ENC MOIS LTX SZ7.5 (GLOVE) ×3 IMPLANT
GLOVE SURG POLYISO LF SZ6.5 (GLOVE) ×2 IMPLANT
GLOVE SURG POLYISO LF SZ7 (GLOVE) ×2 IMPLANT
GLOVE SURG UNDER POLY LF SZ7 (GLOVE) ×6 IMPLANT
GOWN STRL REUS W/ TWL LRG LVL3 (GOWN DISPOSABLE) ×3 IMPLANT
GOWN STRL REUS W/ TWL XL LVL3 (GOWN DISPOSABLE) IMPLANT
GOWN STRL REUS W/TWL LRG LVL3 (GOWN DISPOSABLE) ×9
GOWN STRL REUS W/TWL XL LVL3 (GOWN DISPOSABLE) ×3
IV CATH 18G SAFETY (IV SOLUTION) ×3 IMPLANT
KIT NEURO ACCESSORY W/WRENCH (MISCELLANEOUS) IMPLANT
LEAD SENSING RESP INSPIRE (Lead) ×3 IMPLANT
LEAD SENSING RESP INSPIRE IV (Lead) ×1 IMPLANT
LEAD SLEEP STIM INSPIRE IV/V (Lead) ×1 IMPLANT
LEAD SLEEP STIMULATION INSPIRE (Lead) ×3 IMPLANT
LOOP VESSEL MAXI BLUE (MISCELLANEOUS) ×3 IMPLANT
LOOP VESSEL MINI RED (MISCELLANEOUS) ×3 IMPLANT
MARKER SKIN DUAL TIP RULER LAB (MISCELLANEOUS) ×6 IMPLANT
NDL HYPO 25X1 1.5 SAFETY (NEEDLE) ×1 IMPLANT
NEEDLE HYPO 25X1 1.5 SAFETY (NEEDLE) ×3 IMPLANT
NS IRRIG 1000ML POUR BTL (IV SOLUTION) ×3 IMPLANT
PACK BASIN DAY SURGERY FS (CUSTOM PROCEDURE TRAY) ×3 IMPLANT
PACK ENT DAY SURGERY (CUSTOM PROCEDURE TRAY) ×3 IMPLANT
PASSER CATH 36 CODMAN DISP (NEUROSURGERY SUPPLIES) ×2 IMPLANT
PASSER CATH 38CM DISP (INSTRUMENTS) IMPLANT
PENCIL SMOKE EVACUATOR (MISCELLANEOUS) ×3 IMPLANT
PROBE NERVE STIMULATOR (NEUROSURGERY SUPPLIES) ×3 IMPLANT
REMOTE CONTROL SLEEP INSPIRE (MISCELLANEOUS) ×3 IMPLANT
SET WALTER ACTIVATION W/DRAPE (SET/KITS/TRAYS/PACK) ×3 IMPLANT
SLEEVE SCD COMPRESS KNEE MED (STOCKING) ×3 IMPLANT
SLING ARM FOAM STRAP LRG (SOFTGOODS) IMPLANT
SLING ARM FOAM STRAP MED (SOFTGOODS) IMPLANT
SPONGE INTESTINAL PEANUT (DISPOSABLE) ×3 IMPLANT
STAPLER VISISTAT 35W (STAPLE) IMPLANT
SUT SILK 2 0 SH (SUTURE) ×3 IMPLANT
SUT SILK 3 0 REEL (SUTURE) ×3 IMPLANT
SUT SILK 3 0 SH 30 (SUTURE) ×8 IMPLANT
SUT SILK 3-0 (SUTURE) ×3
SUT SILK 3-0 RB1 30XBRD (SUTURE) ×1
SUT VIC AB 3-0 SH 27 (SUTURE) ×6
SUT VIC AB 3-0 SH 27X BRD (SUTURE) ×1 IMPLANT
SUT VIC AB 4-0 PS2 27 (SUTURE) ×6 IMPLANT
SUTURE SILK 3-0 RB1 30XBRD (SUTURE) ×1 IMPLANT
SYR 10ML LL (SYRINGE) ×3 IMPLANT
SYR BULB EAR ULCER 3OZ GRN STR (SYRINGE) ×3 IMPLANT
TOWEL GREEN STERILE FF (TOWEL DISPOSABLE) ×6 IMPLANT
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
## 2020-06-26 NOTE — Transfer of Care (Signed)
Immediate Anesthesia Transfer of Care Note  Patient: Howard Maxwell  Procedure(s) Performed: IMPLANTATION OF HYPOGLOSSAL NERVE STIMULATOR (Right Chest)  Patient Location: PACU  Anesthesia Type:General  Level of Consciousness: awake, alert , oriented and patient cooperative  Airway & Oxygen Therapy: Patient Spontanous Breathing and Patient connected to face mask oxygen  Post-op Assessment: Report given to RN and Post -op Vital signs reviewed and stable  Post vital signs: Reviewed and stable  Last Vitals:  Vitals Value Taken Time  BP    Temp    Pulse 79 06/26/20 1212  Resp 6 06/26/20 1212  SpO2 97 % 06/26/20 1212  Vitals shown include unvalidated device data.  Last Pain:  Vitals:   06/26/20 0832  TempSrc: Oral  PainSc: 7          Complications: No complications documented.

## 2020-06-26 NOTE — Discharge Instructions (Signed)
  Post Anesthesia Home Care Instructions  Activity: Get plenty of rest for the remainder of the day. A responsible individual must stay with you for 24 hours following the procedure.  For the next 24 hours, DO NOT: -Drive a car -Paediatric nurse -Drink alcoholic beverages -Take any medication unless instructed by your physician -Make any legal decisions or sign important papers.  Meals: Start with liquid foods such as gelatin or soup. Progress to regular foods as tolerated. Avoid greasy, spicy, heavy foods. If nausea and/or vomiting occur, drink only clear liquids until the nausea and/or vomiting subsides. Call your physician if vomiting continues.  Special Instructions/Symptoms: Your throat may feel dry or sore from the anesthesia or the breathing tube placed in your throat during surgery. If this causes discomfort, gargle with warm salt water. The discomfort should disappear within 24 hours.  If you had a scopolamine patch placed behind your ear for the management of post- operative nausea and/or vomiting:  1. The medication in the patch is effective for 72 hours, after which it should be removed.  Wrap patch in a tissue and discard in the trash. Wash hands thoroughly with soap and water. 2. You may remove the patch earlier than 72 hours if you experience unpleasant side effects which may include dry mouth, dizziness or visual disturbances. 3. Avoid touching the patch. Wash your hands with soap and water after contact with the patch.      Next dose of Tylenol can be given after 2:30PM.

## 2020-06-26 NOTE — Brief Op Note (Signed)
06/26/2020  12:01 PM  PATIENT:  Howard Maxwell  75 y.o. male  PRE-OPERATIVE DIAGNOSIS:  Sleep apnea  POST-OPERATIVE DIAGNOSIS:  Sleep apnea  PROCEDURE:  Procedure(s): IMPLANTATION OF HYPOGLOSSAL NERVE STIMULATOR (Right)  SURGEON:  Surgeon(s) and Role:    Melida Quitter, MD - Primary    * Skotnicki, Meghan A, DO - Assisting  PHYSICIAN ASSISTANT:   ASSISTANTS: as above   ANESTHESIA:   general  EBL: Minimal  BLOOD ADMINISTERED:none  DRAINS: none   LOCAL MEDICATIONS USED:  LIDOCAINE   SPECIMEN:  No Specimen  DISPOSITION OF SPECIMEN:  N/A  COUNTS:  YES  TOURNIQUET:  * No tourniquets in log *  DICTATION: .Note written in EPIC  PLAN OF CARE: Discharge to home after PACU  PATIENT DISPOSITION:  PACU - hemodynamically stable.   Delay start of Pharmacological VTE agent (>24hrs) due to surgical blood loss or risk of bleeding: no

## 2020-06-26 NOTE — Anesthesia Postprocedure Evaluation (Signed)
Anesthesia Post Note  Patient: Howard Maxwell  Procedure(s) Performed: IMPLANTATION OF HYPOGLOSSAL NERVE STIMULATOR (Right Chest)     Patient location during evaluation: PACU Anesthesia Type: General Level of consciousness: awake and alert Pain management: pain level controlled Vital Signs Assessment: post-procedure vital signs reviewed and stable Respiratory status: spontaneous breathing, nonlabored ventilation, respiratory function stable and patient connected to nasal cannula oxygen Cardiovascular status: blood pressure returned to baseline and stable Postop Assessment: no apparent nausea or vomiting Anesthetic complications: no   No complications documented.  Last Vitals:  Vitals:   06/26/20 1330 06/26/20 1415  BP: 120/67 128/71  Pulse: 61 71  Resp: (!) 9 14  Temp:  37.2 C  SpO2: 91% 97%    Last Pain:  Vitals:   06/26/20 1400  TempSrc:   PainSc: 0-No pain                 Catalina Gravel

## 2020-06-26 NOTE — Anesthesia Preprocedure Evaluation (Signed)
Anesthesia Evaluation  Patient identified by MRN, date of birth, ID band Patient awake    Reviewed: Allergy & Precautions, NPO status , Patient's Chart, lab work & pertinent test results, reviewed documented beta blocker date and time   History of Anesthesia Complications Negative for: history of anesthetic complications  Airway Mallampati: III  TM Distance: >3 FB Neck ROM: Full    Dental  (+) Teeth Intact, Dental Advisory Given, Caps   Pulmonary sleep apnea , COPD, former smoker,    Pulmonary exam normal breath sounds clear to auscultation       Cardiovascular hypertension, Pt. on home beta blockers and Pt. on medications + angina + CAD and + Past MI  Normal cardiovascular exam Rhythm:Regular Rate:Normal     Neuro/Psych  Neuromuscular disease    GI/Hepatic Neg liver ROS, PUD, GERD  ,  Endo/Other  diabetes, Type 2, Oral Hypoglycemic AgentsObesity   Renal/GU negative Renal ROS     Musculoskeletal  (+) Arthritis ,   Abdominal   Peds  Hematology negative hematology ROS (+)   Anesthesia Other Findings Day of surgery medications reviewed with the patient.  Reproductive/Obstetrics                             Anesthesia Physical Anesthesia Plan  ASA: III  Anesthesia Plan: General   Post-op Pain Management:    Induction: Intravenous  PONV Risk Score and Plan: 3 and Dexamethasone and Ondansetron  Airway Management Planned: Oral ETT  Additional Equipment:   Intra-op Plan:   Post-operative Plan: Extubation in OR  Informed Consent: I have reviewed the patients History and Physical, chart, labs and discussed the procedure including the risks, benefits and alternatives for the proposed anesthesia with the patient or authorized representative who has indicated his/her understanding and acceptance.     Dental advisory given  Plan Discussed with: CRNA  Anesthesia Plan Comments:          Anesthesia Quick Evaluation

## 2020-06-26 NOTE — Op Note (Signed)

## 2020-06-26 NOTE — Anesthesia Procedure Notes (Signed)
Procedure Name: Intubation Date/Time: 06/26/2020 10:08 AM Performed by: Signe Colt, CRNA Pre-anesthesia Checklist: Patient identified, Emergency Drugs available, Suction available and Patient being monitored Patient Re-evaluated:Patient Re-evaluated prior to induction Oxygen Delivery Method: Circle system utilized Preoxygenation: Pre-oxygenation with 100% oxygen Induction Type: IV induction Ventilation: Mask ventilation without difficulty Laryngoscope Size: Mac and 3 Grade View: Grade III Tube type: Oral Tube size: 7.0 mm Number of attempts: 1 Airway Equipment and Method: Stylet and Oral airway Placement Confirmation: ETT inserted through vocal cords under direct vision,  positive ETCO2 and breath sounds checked- equal and bilateral Secured at: 22 cm Tube secured with: Tape Dental Injury: Teeth and Oropharynx as per pre-operative assessment

## 2020-06-27 ENCOUNTER — Encounter (HOSPITAL_BASED_OUTPATIENT_CLINIC_OR_DEPARTMENT_OTHER): Payer: Self-pay | Admitting: Otolaryngology

## 2020-06-27 ENCOUNTER — Ambulatory Visit: Payer: Medicare Other | Admitting: Cardiovascular Disease

## 2020-07-01 ENCOUNTER — Telehealth: Payer: Self-pay | Admitting: Cardiovascular Disease

## 2020-07-01 DIAGNOSIS — R9389 Abnormal findings on diagnostic imaging of other specified body structures: Secondary | ICD-10-CM

## 2020-07-01 NOTE — Telephone Encounter (Signed)
Returned the patients call. DPR on file. Spoke with the patients wife. Adv her that the cxr that they are referring to is available for review in the patients chart. Adv that Dr. Fletcher Anon is out of the office this week. I will fwd him the msg to review and give his recommendation on his return. Pt wife verbalized understanding and voiced appreciation for the call back.

## 2020-07-01 NOTE — Telephone Encounter (Signed)
Patient calling  States that he was told after his procedure that he has an enlarged heart - he has never heard this  Would like to know if Dr Fletcher Anon was aware and if he would need to schedule an appointment Please call to discuss

## 2020-07-02 NOTE — Telephone Encounter (Signed)
Called to give the patient Dr. Tyrell Antonio response and recommendation. Order has been placed for the recommended echo. Patient already has an appt scheduled with Dr. Fletcher Anon in May.

## 2020-07-02 NOTE — Telephone Encounter (Signed)
Spoke with the patient.  Patient denies any sob, weight gain, swelling, or increased fatigue. Patient is agreeable with having the recommended echocardiogram. Echo scheduled for 07/31/20. He is scheduled to see Dr. Fletcher Anon on 08/08/20. Adv the patient to contact the office sooner if symptoms develop. Patient verbalized understanding and voiced appreciation for the call.

## 2020-07-02 NOTE — Telephone Encounter (Signed)
Cardiac enlargement seems to be a new finding on recent chest X ray. Recommend an echocardiogram to evaluate and follow up after.

## 2020-07-11 DIAGNOSIS — R0982 Postnasal drip: Secondary | ICD-10-CM | POA: Insufficient documentation

## 2020-07-14 ENCOUNTER — Other Ambulatory Visit: Payer: Self-pay | Admitting: Cardiovascular Disease

## 2020-07-23 ENCOUNTER — Other Ambulatory Visit: Payer: Self-pay | Admitting: Cardiovascular Disease

## 2020-07-23 DIAGNOSIS — I517 Cardiomegaly: Secondary | ICD-10-CM

## 2020-07-31 ENCOUNTER — Other Ambulatory Visit: Payer: Self-pay

## 2020-07-31 ENCOUNTER — Encounter: Payer: Self-pay | Admitting: Podiatry

## 2020-07-31 ENCOUNTER — Ambulatory Visit: Payer: Medicare Other | Admitting: Podiatry

## 2020-07-31 ENCOUNTER — Telehealth: Payer: Self-pay | Admitting: Cardiovascular Disease

## 2020-07-31 ENCOUNTER — Ambulatory Visit (INDEPENDENT_AMBULATORY_CARE_PROVIDER_SITE_OTHER): Payer: Medicare Other

## 2020-07-31 DIAGNOSIS — D2372 Other benign neoplasm of skin of left lower limb, including hip: Secondary | ICD-10-CM | POA: Diagnosis not present

## 2020-07-31 DIAGNOSIS — E1165 Type 2 diabetes mellitus with hyperglycemia: Secondary | ICD-10-CM | POA: Diagnosis not present

## 2020-07-31 DIAGNOSIS — B351 Tinea unguium: Secondary | ICD-10-CM | POA: Diagnosis not present

## 2020-07-31 DIAGNOSIS — M79676 Pain in unspecified toe(s): Secondary | ICD-10-CM

## 2020-07-31 DIAGNOSIS — D2371 Other benign neoplasm of skin of right lower limb, including hip: Secondary | ICD-10-CM | POA: Diagnosis not present

## 2020-07-31 DIAGNOSIS — I517 Cardiomegaly: Secondary | ICD-10-CM

## 2020-07-31 LAB — ECHOCARDIOGRAM COMPLETE
AR max vel: 2.94 cm2
AV Area VTI: 2.89 cm2
AV Area mean vel: 2.77 cm2
AV Mean grad: 4 mmHg
AV Peak grad: 7.7 mmHg
Ao pk vel: 1.39 m/s
Area-P 1/2: 2.96 cm2
S' Lateral: 4 cm

## 2020-07-31 MED ORDER — PERFLUTREN LIPID MICROSPHERE
1.0000 mL | INTRAVENOUS | Status: AC | PRN
  Administered 2020-07-31: 2 mL via INTRAVENOUS

## 2020-07-31 NOTE — Telephone Encounter (Signed)
Patient is calling, states he has an echo today, and has a "generator" in his chest for sleep apnea, he asks if this will interfere with his echo.

## 2020-07-31 NOTE — Progress Notes (Signed)
He presents today chief complaint of painfully elongated toenails 1 through 5 bilaterally and he is also complaining of a painful area to the plantar aspect of the forefoot left.  He denies fever chills nausea vomit muscle aches pains calf pain back pain chest pain shortness of breath.  Objective: Pulses are strongly palpable today.  There is no erythema no edema cellulitis drainage or odor fat pad atrophy is noted plantar aspect of the forefoot.  Reactive hyperkeratotic lesion benign in nature subfourth metatarsal head of the left foot.  Otherwise toenails are long thick yellow dystrophic with mycotic painful palpation.  Assessment: Pain in limb secondary to onychomycosis and benign skin lesion.  Plan: Debridement of benign skin lesion.  Debridement of toenails 1 through 5 bilateral.  Follow-up with [place] as needed.

## 2020-07-31 NOTE — Telephone Encounter (Signed)
Spoke with Howard Maxwell and informed her that the generator is okay with the Ultrasound, she informed the patient of this.

## 2020-08-08 ENCOUNTER — Other Ambulatory Visit: Payer: Self-pay

## 2020-08-08 ENCOUNTER — Ambulatory Visit: Payer: Medicare Other | Admitting: Cardiovascular Disease

## 2020-08-08 ENCOUNTER — Encounter: Payer: Self-pay | Admitting: Cardiovascular Disease

## 2020-08-08 VITALS — BP 130/82 | HR 65 | Ht 70.0 in | Wt 236.2 lb

## 2020-08-08 DIAGNOSIS — I471 Supraventricular tachycardia: Secondary | ICD-10-CM

## 2020-08-08 DIAGNOSIS — I10 Essential (primary) hypertension: Secondary | ICD-10-CM

## 2020-08-08 DIAGNOSIS — I2510 Atherosclerotic heart disease of native coronary artery without angina pectoris: Secondary | ICD-10-CM

## 2020-08-08 DIAGNOSIS — E785 Hyperlipidemia, unspecified: Secondary | ICD-10-CM | POA: Diagnosis not present
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
## 2020-08-08 NOTE — Patient Instructions (Signed)
Medication Instructions:  Your physician recommends that you continue on your current medications as directed. Please refer to the Current Medication list given to you today.  *If you need a refill on your cardiac medications before your next appointment, please call your pharmacy*   Lab Work: None ordered If you have labs (blood work) drawn today and your tests are completely normal, you will receive your results only by: Marland Kitchen MyChart Message (if you have MyChart) OR . A paper copy in the mail If you have any lab test that is abnormal or we need to change your treatment, we will call you to review the results.   Testing/Procedures: None ordered   Follow-Up: At [facility], you and your health needs are our priority.  As part of our continuing mission to provide you with exceptional heart care, we have created designated Provider Care Teams.  These Care Teams include your primary Cardiologist (physician) and Advanced Practice Providers (APPs -  Physician Assistants and Nurse Practitioners) who all work together to provide you with the care you need, when you need it.  We recommend signing up for the patient portal called "MyChart".  Sign up information is provided on this After Visit Summary.  MyChart is used to connect with patients for Virtual Visits (Telemedicine).  Patients are able to view lab/test results, encounter notes, upcoming appointments, etc.  Non-urgent messages can be sent to your provider as well.   To learn more about what you can do with MyChart, go to [email or web address].    Your next appointment:   Your physician wants you to follow-up in: 1 year You will receive a reminder letter in the mail two months in advance. If you don't receive a letter, please call our office to schedule the follow-up appointment.   The format for your next appointment:   In Person  Provider:   You may see Kathlyn Sacramento, MD or one of the following Advanced Practice Providers on your  designated Care Team:    Murray Hodgkins, NP  Christell Faith, PA-C  Marrianne Mood, PA-C  Cadence [place], [place]  Laurann [place], NP    Other Instructions N/A

## 2020-08-21 ENCOUNTER — Encounter: Payer: Self-pay | Admitting: Pain Medicine

## 2020-08-21 ENCOUNTER — Other Ambulatory Visit: Payer: Self-pay

## 2020-08-21 ENCOUNTER — Ambulatory Visit: Payer: Medicare Other | Attending: Pain Medicine | Admitting: Pain Medicine

## 2020-08-21 VITALS — BP 130/65 | HR 66 | Temp 97.1°F | Resp 16 | Ht 70.0 in | Wt 235.0 lb

## 2020-08-21 DIAGNOSIS — M542 Cervicalgia: Secondary | ICD-10-CM | POA: Diagnosis present

## 2020-08-21 DIAGNOSIS — M25559 Pain in unspecified hip: Secondary | ICD-10-CM | POA: Diagnosis present

## 2020-08-21 DIAGNOSIS — M79601 Pain in right arm: Secondary | ICD-10-CM | POA: Insufficient documentation

## 2020-08-21 DIAGNOSIS — G894 Chronic pain syndrome: Secondary | ICD-10-CM

## 2020-08-21 DIAGNOSIS — M79602 Pain in left arm: Secondary | ICD-10-CM | POA: Insufficient documentation

## 2020-08-21 DIAGNOSIS — M545 Low back pain, unspecified: Secondary | ICD-10-CM | POA: Insufficient documentation

## 2020-08-21 DIAGNOSIS — M7138 Other bursal cyst, other site: Secondary | ICD-10-CM | POA: Diagnosis present

## 2020-08-21 DIAGNOSIS — Z79891 Long term (current) use of opiate analgesic: Secondary | ICD-10-CM

## 2020-08-21 DIAGNOSIS — M160 Bilateral primary osteoarthritis of hip: Secondary | ICD-10-CM

## 2020-08-21 DIAGNOSIS — G8929 Other chronic pain: Secondary | ICD-10-CM

## 2020-08-21 DIAGNOSIS — Z79899 Other long term (current) drug therapy: Secondary | ICD-10-CM

## 2020-08-21 DIAGNOSIS — M4820 Kissing spine, site unspecified: Secondary | ICD-10-CM

## 2020-08-21 DIAGNOSIS — M47816 Spondylosis without myelopathy or radiculopathy, lumbar region: Secondary | ICD-10-CM | POA: Diagnosis present

## 2020-08-21 MED ORDER — MORPHINE SULFATE ER 15 MG PO TBCR: 15.0000 mg | EXTENDED_RELEASE_TABLET | Freq: Two times a day (BID) | ORAL | 0 refills | Status: DC

## 2020-08-21 NOTE — Progress Notes (Signed)
Nursing Pain Medication Assessment:  Safety precautions to be maintained throughout the outpatient stay will include: orient to surroundings, keep bed in low position, maintain call bell within reach at all times, provide assistance with transfer out of bed and ambulation.  Medication Inspection Compliance: Pill count conducted under aseptic conditions, in front of the patient. Neither the pills nor the bottle was removed from the patient's sight at any time. Once count was completed pills were immediately returned to the patient in their original bottle.  Medication: Morphine ER (MSContin) Pill/Patch Count: 6 of 60 pills remain Pill/Patch Appearance: Markings consistent with prescribed medication Bottle Appearance: Standard pharmacy container. Clearly labeled. Filled Date: 07/26/2020 Last Medication intake:  Today

## 2020-08-21 NOTE — Patient Instructions (Addendum)
____________________________________________________________________________________________  Medication Rules  Purpose: To inform patients, and their family members, of our rules and regulations.  Applies to: All patients receiving prescriptions (written or electronic).  Pharmacy of record: Pharmacy where electronic prescriptions will be sent. If written prescriptions are taken to a different pharmacy, please inform the nursing staff. The pharmacy listed in the electronic medical record should be the one where you would like electronic prescriptions to be sent.  Electronic prescriptions: In compliance with the [place] (STOP) Act of 2017 (Session Lanny Cramp [phone]), effective March 23, 2018, all controlled substances must be electronically prescribed. Calling prescriptions to the pharmacy will cease to exist.  Prescription refills: Only during scheduled appointments. Applies to all prescriptions.  NOTE: The following applies primarily to controlled substances (Opioid* Pain Medications).   Type of encounter (visit): For patients receiving controlled substances, face-to-face visits are required. (Not an option or up to the patient.)  Patient's responsibilities: 1. Pain Pills: Bring all pain pills to every appointment (except for procedure appointments). 2. Pill Bottles: Bring pills in original pharmacy bottle. Always bring the newest bottle. Bring bottle, even if empty. 3. Medication refills: You are responsible for knowing and keeping track of what medications you take and those you need refilled. The day before your appointment: write a list of all prescriptions that need to be refilled. The day of the appointment: give the list to the admitting nurse. Prescriptions will be written only during appointments. No prescriptions will be written on procedure days. If you forget a medication: it will not be "Called in", "Faxed", or "electronically sent".  You will need to get another appointment to get these prescribed. No early refills. Do not call asking to have your prescription filled early. 4. Prescription Accuracy: You are responsible for carefully inspecting your prescriptions before leaving our office. Have the discharge nurse carefully go over each prescription with you, before taking them home. Make sure that your name is accurately spelled, that your address is correct. Check the name and dose of your medication to make sure it is accurate. Check the number of pills, and the written instructions to make sure they are clear and accurate. Make sure that you are given enough medication to last until your next medication refill appointment. 5. Taking Medication: Take medication as prescribed. When it comes to controlled substances, taking less pills or less frequently than prescribed is permitted and encouraged. Never take more pills than instructed. Never take medication more frequently than prescribed.  6. Inform other Doctors: Always inform, all of your healthcare providers, of all the medications you take. 7. Pain Medication from other Providers: You are not allowed to accept any additional pain medication from any other Doctor or Healthcare provider. There are two exceptions to this rule. (see below) In the event that you require additional pain medication, you are responsible for notifying us, as stated below. 8. Cough Medicine: Often these contain an opioid, such as codeine or hydrocodone. Never accept or take cough medicine containing these opioids if you are already taking an opioid* medication. The combination may cause respiratory failure and death. 9. Medication Agreement: You are responsible for carefully reading and following our Medication Agreement. This must be signed before receiving any prescriptions from our practice. Safely store a copy of your signed Agreement. Violations to the Agreement will result in no further prescriptions.  (Additional copies of our Medication Agreement are available upon request.) 10. Laws, Rules, & Regulations: All patients are expected to follow all  Federal and [employer], [employer], [employer], [employer]. Ignorance of the Laws does not constitute a valid excuse.  11. Illegal drugs and Controlled Substances: The use of illegal substances (including, but not limited to marijuana and its derivatives) and/or the illegal use of any controlled substances is strictly prohibited. Violation of this rule may result in the immediate and permanent discontinuation of any and all prescriptions being written by our practice. The use of any illegal substances is prohibited. 12. Adopted CDC guidelines & recommendations: Target dosing levels will be at or below 60 MME/day. Use of benzodiazepines** is not recommended.  Exceptions: There are only two exceptions to the rule of not receiving pain medications from other Healthcare Providers. 1. Exception #1 (Emergencies): In the event of an emergency (i.e.: accident requiring emergency care), you are allowed to receive additional pain medication. However, you are responsible for: As soon as you are able, call our office [phone], at any time of the day or night, and leave a message stating your name, the date and nature of the emergency, and the name and dose of the medication prescribed. In the event that your call is answered by a member of our staff, make sure to document and save the date, time, and the name of the person that took your information.  2. Exception #2 (Planned Surgery): In the event that you are scheduled by another doctor or dentist to have any type of surgery or procedure, you are allowed (for a period no longer than 30 days), to receive additional pain medication, for the acute post-op pain. However, in this case, you are responsible for picking up a copy of our "Post-op Pain Management for Surgeons" handout, and giving it to your surgeon or dentist. This  document is available at our office, and does not require an appointment to obtain it. Simply go to our office during business hours (Monday-Thursday from 8:00 AM to 4:00 PM) (Friday 8:00 AM to 12:00 Noon) or if you have a scheduled appointment with [place], prior to your surgery, and ask for it by name. In addition, you are responsible for: calling our office [phone], at any time of the day or night, and leaving a message stating your name, name of your surgeon, type of surgery, and date of procedure or surgery. Failure to comply with your responsibilities may result in termination of therapy involving the controlled substances.  *Opioid medications include: morphine, codeine, oxycodone, oxymorphone, hydrocodone, hydromorphone, meperidine, tramadol, tapentadol, buprenorphine, fentanyl, methadone. **Benzodiazepine medications include: diazepam (Valium), alprazolam (Xanax), clonazepam (Klonopine), lorazepam (Ativan), clorazepate (Tranxene), chlordiazepoxide (Librium), estazolam (Prosom), oxazepam (Serax), temazepam (Restoril), triazolam (Halcion) (Last updated: 02/19/2020) ____________________________________________________________________________________________   ____________________________________________________________________________________________  Medication Recommendations and Reminders  Applies to: All patients receiving prescriptions (written and/or electronic).  Medication Rules & Regulations: These rules and regulations exist for your safety and that of others. They are not flexible and neither are we. Dismissing or ignoring them will be considered "non-compliance" with medication therapy, resulting in complete and irreversible termination of such therapy. (See document titled "Medication Rules" for more details.) In all conscience, because of safety reasons, we cannot continue providing a therapy where the patient does not follow instructions.  Pharmacy of record:   Definition:  This is the pharmacy where your electronic prescriptions will be sent.   We do not endorse any particular pharmacy, however, we have experienced problems with [facility] not securing enough medication supply for the community.  We do not restrict you in your choice of pharmacy. However,  once we write for your prescriptions, we will NOT be re-sending more prescriptions to fix restricted supply problems created by your pharmacy, or your insurance.   The pharmacy listed in the electronic medical record should be the one where you want electronic prescriptions to be sent.  If you choose to change pharmacy, simply notify our nursing staff.  Recommendations:  Keep all of your pain medications in a safe place, under lock and key, even if you live alone. We will NOT replace lost, stolen, or damaged medication.  After you fill your prescription, take 1 week's worth of pills and put them away in a safe place. You should keep a separate, properly labeled bottle for this purpose. The remainder should be kept in the original bottle. Use this as your primary supply, until it runs out. Once it's gone, then you know that you have 1 week's worth of medicine, and it is time to come in for a prescription refill. If you do this correctly, it is unlikely that you will ever run out of medicine.  To make sure that the above recommendation works, it is very important that you make sure your medication refill appointments are scheduled at least 1 week before you run out of medicine. To do this in an effective manner, make sure that you do not leave the office without scheduling your next medication management appointment. Always ask the nursing staff to show you in your prescription , when your medication will be running out. Then arrange for the receptionist to get you a return appointment, at least 7 days before you run out of medicine. Do not wait until you have 1 or 2 pills left, to come in. This is very poor planning and  does not take into consideration that we may need to cancel appointments due to bad weather, sickness, or emergencies affecting our staff.  DO NOT ACCEPT A "Partial Fill": If for any reason your pharmacy does not have enough pills/tablets to completely fill or refill your prescription, do not allow for a "partial fill". The law allows the pharmacy to complete that prescription within 72 hours, without requiring a new prescription. If they do not fill the rest of your prescription within those 72 hours, you will need a separate prescription to fill the remaining amount, which we will NOT provide. If the reason for the partial fill is your insurance, you will need to talk to the pharmacist about payment alternatives for the remaining tablets, but again, DO NOT ACCEPT A PARTIAL FILL, unless you can trust your pharmacist to obtain the remainder of the pills within 72 hours.  Prescription refills and/or changes in medication(s):   Prescription refills, and/or changes in dose or medication, will be conducted only during scheduled medication management appointments. (Applies to both, written and electronic prescriptions.)  No refills on procedure days. No medication will be changed or started on procedure days. No changes, adjustments, and/or refills will be conducted on a procedure day. Doing so will interfere with the diagnostic portion of the procedure.  No phone refills. No medications will be "called into the pharmacy".  No Fax refills.  No weekend refills.  No Holliday refills.  No after hours refills.  Remember:  Business hours are:  Monday to Thursday 8:00 AM to 4:00 PM Provider's Schedule: Milinda Pointer, MD - Appointments are:  Medication management: Monday and Wednesday 8:00 AM to 4:00 PM Procedure day: Tuesday and Thursday 7:30 AM to 4:00 PM Gillis Santa, MD - Appointments are:  Medication management: Tuesday and Thursday 8:00 AM to 4:00 PM Procedure day: Monday and Wednesday  7:30 AM to 4:00 PM (Last update: 10/11/2019) ____________________________________________________________________________________________   ____________________________________________________________________________________________  CBD (cannabidiol) WARNING  Applicable to: All individuals currently taking or considering taking CBD (cannabidiol) and, more important, all patients taking opioid analgesic controlled substances (pain medication). (Example: oxycodone; oxymorphone; hydrocodone; hydromorphone; morphine; methadone; tramadol; tapentadol; fentanyl; buprenorphine; butorphanol; dextromethorphan; meperidine; codeine; etc.)  Legal status: CBD remains a Schedule I drug prohibited for any use. CBD is illegal with one exception. In the [place], CBD has a limited Transport planner (FDA) approval for the treatment of two specific types of epilepsy disorders. Only one CBD product has been approved by the FDA for this purpose: "Epidiolex". FDA is aware that some companies are marketing products containing cannabis and cannabis-derived compounds in ways that violate the [employer], Drug and Cosmetic Act [facility] Act) and that may put the health and safety of consumers at risk. The FDA, a Federal agency, has not enforced the CBD status since 2018.   Legality: Some manufacturers ship CBD products nationally, which is illegal. Often such products are sold online and are therefore available throughout the country. CBD is openly sold in head shops and health food stores in some states where such sales have not been explicitly legalized. Selling unapproved products with unsubstantiated therapeutic claims is not only a violation of the law, but also can put patients at risk, as these products have not been proven to be safe or effective. Federal illegality makes it difficult to conduct research on CBD.  Reference: "FDA Regulation of Cannabis and Cannabis-Derived Products, Including Cannabidiol  (CBD)" - [employer]  Warning: CBD is not FDA approved and has not undergo the same manufacturing controls as prescription drugs.  This means that the purity and safety of available CBD may be questionable. Most of the time, despite manufacturer's claims, it is contaminated with THC (delta-9-tetrahydrocannabinol - the chemical in marijuana responsible for the "HIGH").  When this is the case, the [facility] contaminant will trigger a positive urine drug screen (UDS) test for Marijuana (carboxy-THC). Because a positive UDS for any illicit substance is a violation of our medication agreement, your opioid analgesics (pain medicine) may be permanently discontinued.  MORE ABOUT CBD  General Information: CBD  is a derivative of the Marijuana (cannabis sativa) plant discovered in 40. It is one of the 113 identified substances found in Marijuana. It accounts for up to 40% of the plant's extract. As of 2018, preliminary clinical studies on CBD included research for the treatment of anxiety, movement disorders, and pain. CBD is available and consumed in multiple forms, including inhalation of smoke or vapor, as an aerosol spray, and by mouth. It may be supplied as an oil containing CBD, capsules, dried cannabis, or as a liquid solution. CBD is thought not to be as psychoactive as THC (delta-9-tetrahydrocannabinol - the chemical in marijuana responsible for the "HIGH"). Studies suggest that CBD may interact with different biological target receptors in the body, including cannabinoid and other neurotransmitter receptors. As of 2018 the mechanism of action for its biological effects has not been determined.  Side-effects  Adverse reactions: Dry mouth, diarrhea, decreased appetite, fatigue, drowsiness, malaise, weakness, sleep disturbances, and others.  Drug interactions: CBC may interact with other  medications such as blood-thinners. (Last update: 10/28/2019) ____________________________________________________________________________________________   ____________________________________________________________________________________________  Drug Holidays (Slow)  What is a "Drug Holiday"? Drug Holiday: is the name given to the period of time during  which a patient stops taking a medication(s) for the purpose of eliminating tolerance to the drug.  Benefits . Improved effectiveness of opioids. . Decreased opioid dose needed to achieve benefits. . Improved pain with lesser dose.  What is tolerance? Tolerance: is the progressive decreased in effectiveness of a drug due to its repetitive use. With repetitive use, the body gets use to the medication and as a consequence, it loses its effectiveness. This is a common problem seen with opioid pain medications. As a result, a larger dose of the drug is needed to achieve the same effect that used to be obtained with a smaller dose.  How long should a "Drug Holiday" last? You should stay off of the pain medicine for at least 14 consecutive days. (2 weeks)  Should I stop the medicine "cold [place]"? No. You should always coordinate with your Pain Specialist so that he/she can provide you with the correct medication dose to make the transition as smoothly as possible.  How do I stop the medicine? Slowly. You will be instructed to decrease the daily amount of pills that you take by one (1) pill every seven (7) days. This is called a "slow downward taper" of your dose. For example: if you normally take four (4) pills per day, you will be asked to drop this dose to three (3) pills per day for seven (7) days, then to two (2) pills per day for seven (7) days, then to one (1) per day for seven (7) days, and at the end of those last seven (7) days, this is when the "Drug Holiday" would start.   Will I have withdrawals? By doing a "slow downward  taper" like this one, it is unlikely that you will experience any significant withdrawal symptoms. Typically, what triggers withdrawals is the sudden stop of a high dose opioid therapy. Withdrawals can usually be avoided by slowly decreasing the dose over a prolonged period of time. If you do not follow these instructions and decide to stop your medication abruptly, withdrawals may be possible.  What are withdrawals? Withdrawals: refers to the wide range of symptoms that occur after stopping or dramatically reducing opiate drugs after heavy and prolonged use. Withdrawal symptoms do not occur to patients that use low dose opioids, or those who take the medication sporadically. Contrary to benzodiazepine (example: Valium, Xanax, etc.) or alcohol withdrawals ("Delirium Tremens"), opioid withdrawals are not lethal. Withdrawals are the physical manifestation of the body getting rid of the excess receptors.  Expected Symptoms Early symptoms of withdrawal may include: . Agitation . Anxiety . Muscle aches . Increased tearing . Insomnia . Runny nose . Sweating . Yawning  Late symptoms of withdrawal may include: . Abdominal cramping . Diarrhea . Dilated pupils . Goose bumps . Nausea . Vomiting  Will I experience withdrawals? Due to the slow nature of the taper, it is very unlikely that you will experience any.  What is a slow taper? Taper: refers to the gradual decrease in dose.  (Last update: 10/11/2019) ____________________________________________________________________________________________    ____________________________________________________________________________________________  Preparing for your procedure (without sedation)  Procedure appointments are limited to planned procedures: . No Prescription Refills. . No disability issues will be discussed. . No medication changes will be discussed.  Instructions: . Oral Intake: Do not eat or drink anything for at least 6  hours prior to your procedure. (Exception: Blood Pressure Medication. See below.) . Transportation: Unless otherwise stated by your physician, you may drive yourself after the procedure. . Blood  Pressure Medicine: Do not forget to take your blood pressure medicine with a sip of water the morning of the procedure. If your Diastolic (lower reading)is above 100 mmHg, elective cases will be cancelled/rescheduled. . Blood thinners: These will need to be stopped for procedures. Notify our staff if you are taking any blood thinners. Depending on which one you take, there will be specific instructions on how and when to stop it. . Diabetics on insulin: Notify the staff so that you can be scheduled 1st case in the morning. If your diabetes requires high dose insulin, take only  of your normal insulin dose the morning of the procedure and notify the staff that you have done so. . Preventing infections: Shower with an antibacterial soap the morning of your procedure.  . Build-up your immune system: Take 1000 mg of Vitamin C with every meal (3 times a day) the day prior to your procedure. Marland Kitchen Antibiotics: Inform the staff if you have a condition or reason that requires you to take antibiotics before dental procedures. . Pregnancy: If you are pregnant, call and cancel the procedure. . Sickness: If you have a cold, fever, or any active infections, call and cancel the procedure. . Arrival: You must be in the facility at least 30 minutes prior to your scheduled procedure. . Children: Do not bring any children with you. . Dress appropriately: Bring dark clothing that you would not mind if they get stained. . Valuables: Do not bring any jewelry or valuables.  Reasons to call and reschedule or cancel your procedure: (Following these recommendations will minimize the risk of a serious complication.) . Surgeries: Avoid having procedures within 2 weeks of any surgery. (Avoid for 2 weeks before or after any surgery). . Flu  Shots: Avoid having procedures within 2 weeks of a flu shots or . (Avoid for 2 weeks before or after immunizations). . Barium: Avoid having a procedure within 7-10 days after having had a radiological study involving the use of radiological contrast. (Myelograms, Barium swallow or enema study). . Heart attacks: Avoid any elective procedures or surgeries for the initial 6 months after a "Myocardial Infarction" (Heart Attack). . Blood thinners: It is imperative that you stop these medications before procedures. Let us know if you if you take any blood thinner.  . Infection: Avoid procedures during or within two weeks of an infection (including chest colds or gastrointestinal problems). Symptoms associated with infections include: Localized redness, fever, chills, night sweats or profuse sweating, burning sensation when voiding, cough, congestion, stuffiness, runny nose, sore throat, diarrhea, nausea, vomiting, cold or Flu symptoms, recent or current infections. It is specially important if the infection is over the area that we intend to treat. Marland Kitchen Heart and lung problems: Symptoms that may suggest an active cardiopulmonary problem include: cough, chest pain, breathing difficulties or shortness of breath, dizziness, ankle swelling, uncontrolled high or unusually low blood pressure, and/or palpitations. If you are experiencing any of these symptoms, cancel your procedure and contact your primary care physician for an evaluation.  Remember:  Regular Business hours are:  Monday to Thursday 8:00 AM to 4:00 PM  Provider's Schedule: Milinda Pointer, MD:  Procedure days: Tuesday and Thursday 7:30 AM to 4:00 PM  Gillis Santa, MD:  Procedure days: Monday and Wednesday 7:30 AM to 4:00 PM ____________________________________________________________________________________________   ____________________________________________________________________________________________  General Risks and Possible  Complications  Patient Responsibilities: It is important that you read this as it is part of your informed consent. It is our duty  to inform you of the risks and possible complications associated with treatments offered to you. It is your responsibility as a patient to read this and to ask questions about anything that is not clear or that you believe was not covered in this document.  Patient's Rights: You have the right to refuse treatment. You also have the right to change your mind, even after initially having agreed to have the treatment done. However, under this last option, if you wait until the last second to change your mind, you may be charged for the materials used up to that point.  Introduction: Medicine is not an Chief Strategy Officer. Everything in Medicine, including the lack of treatment(s), carries the potential for danger, harm, or loss (which is by definition: Risk). In Medicine, a complication is a secondary problem, condition, or disease that can aggravate an already existing one. All treatments carry the risk of possible complications. The fact that a side effects or complications occurs, does not imply that the treatment was conducted incorrectly. It must be clearly understood that these can happen even when everything is done following the highest safety standards.  No treatment: You can choose not to proceed with the proposed treatment alternative. The "PRO(s)" would include: avoiding the risk of complications associated with the therapy. The "CON(s)" would include: not getting any of the treatment benefits. These benefits fall under one of three categories: diagnostic; therapeutic; and/or palliative. Diagnostic benefits include: getting information which can ultimately lead to improvement of the disease or symptom(s). Therapeutic benefits are those associated with the successful treatment of the disease. Finally, palliative benefits are those related to the decrease of the primary  symptoms, without necessarily curing the condition (example: decreasing the pain from a flare-up of a chronic condition, such as incurable terminal cancer).  General Risks and Complications: These are associated to most interventional treatments. They can occur alone, or in combination. They fall under one of the following six (6) categories: no benefit or worsening of symptoms; bleeding; infection; nerve damage; allergic reactions; and/or death. 1. No benefits or worsening of symptoms: In Medicine there are no guarantees, only probabilities. No healthcare provider can ever guarantee that a medical treatment will work, they can only state the probability that it may. Furthermore, there is always the possibility that the condition may worsen, either directly, or indirectly, as a consequence of the treatment. 2. Bleeding: This is more common if the patient is taking a blood thinner, either prescription or over the counter (example: Goody Powders, Fish oil, Aspirin, Garlic, etc.), or if suffering a condition associated with impaired coagulation (example: Hemophilia, cirrhosis of the liver, low platelet counts, etc.). However, even if you do not have one on these, it can still happen. If you have any of these conditions, or take one of these drugs, make sure to notify your treating physician. 3. Infection: This is more common in patients with a compromised immune system, either due to disease (example: diabetes, cancer, human immunodeficiency virus [HIV], etc.), or due to medications or treatments (example: therapies used to treat cancer and rheumatological diseases). However, even if you do not have one on these, it can still happen. If you have any of these conditions, or take one of these drugs, make sure to notify your treating physician. 4. Nerve Damage: This is more common when the treatment is an invasive one, but it can also happen with the use of medications, such as those used in the treatment of cancer.  The damage can occur  to small secondary nerves, or to large primary ones, such as those in the spinal cord and brain. This damage may be temporary or permanent and it may lead to impairments that can range from temporary numbness to permanent paralysis and/or brain death. 5. Allergic Reactions: Any time a substance or material comes in contact with our body, there is the possibility of an allergic reaction. These can range from a mild skin rash (contact dermatitis) to a severe systemic reaction (anaphylactic reaction), which can result in death. 6. Death: In general, any medical intervention can result in death, most of the time due to an unforeseen complication. ____________________________________________________________________________________________
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
## 2020-08-28 DIAGNOSIS — Z87891 Personal history of nicotine dependence: Secondary | ICD-10-CM | POA: Insufficient documentation

## 2020-08-28 DIAGNOSIS — G8929 Other chronic pain: Secondary | ICD-10-CM | POA: Insufficient documentation

## 2020-08-28 LAB — TOXASSURE SELECT 13 (MW), URINE

## 2020-08-29 ENCOUNTER — Other Ambulatory Visit (HOSPITAL_BASED_OUTPATIENT_CLINIC_OR_DEPARTMENT_OTHER): Payer: Self-pay

## 2020-08-29 DIAGNOSIS — G4733 Obstructive sleep apnea (adult) (pediatric): Secondary | ICD-10-CM

## 2020-09-11 ENCOUNTER — Telehealth: Payer: Self-pay | Admitting: *Deleted

## 2020-09-11 DIAGNOSIS — Z122 Encounter for screening for malignant neoplasm of respiratory organs: Secondary | ICD-10-CM

## 2020-09-11 DIAGNOSIS — Z87891 Personal history of nicotine dependence: Secondary | ICD-10-CM

## 2020-09-11 NOTE — Telephone Encounter (Addendum)
Received referral for low dose lung cancer screening CT scan. Message left at phone number listed in EMR for patient to call me back to facilitate scheduling scan  Received referral for initial lung cancer screening scan. Contacted patient and obtained smoking history,(former, quit 2015, 70.5 pack year) as well as answering questions related to screening process. Patient denies signs of lung cancer such as weight loss or hemoptysis. Patient denies comorbidity that would prevent curative treatment if lung cancer were found. Patient is scheduled for shared decision making visit was done by PCP on 08/28/20,  CT scan on 09/24/20.

## 2020-09-12 NOTE — Addendum Note (Signed)
Addended by: Lieutenant Diego on: 09/12/2020 11:16 AM   Modules accepted: Orders

## 2020-09-15 ENCOUNTER — Other Ambulatory Visit: Payer: Self-pay | Admitting: Cardiovascular Disease

## 2020-09-24 ENCOUNTER — Ambulatory Visit
Admission: RE | Admit: 2020-09-24 | Discharge: 2020-09-24 | Disposition: A | Discharge status: Home / Self Care | Payer: Medicare Other | Source: Ambulatory Visit | Attending: Nurse Practitioner | Admitting: Nurse Practitioner

## 2020-09-24 ENCOUNTER — Other Ambulatory Visit: Payer: Self-pay

## 2020-09-24 DIAGNOSIS — Z87891 Personal history of nicotine dependence: Secondary | ICD-10-CM | POA: Diagnosis not present

## 2020-09-24 DIAGNOSIS — Z122 Encounter for screening for malignant neoplasm of respiratory organs: Secondary | ICD-10-CM | POA: Diagnosis present

## 2020-10-08 ENCOUNTER — Encounter: Payer: Self-pay | Admitting: *Deleted

## 2020-11-04 ENCOUNTER — Ambulatory Visit: Payer: Medicare Other | Admitting: Podiatry

## 2020-11-04 ENCOUNTER — Other Ambulatory Visit: Payer: Self-pay

## 2020-11-04 ENCOUNTER — Encounter: Payer: Self-pay | Admitting: Podiatry

## 2020-11-04 DIAGNOSIS — D2372 Other benign neoplasm of skin of left lower limb, including hip: Secondary | ICD-10-CM

## 2020-11-04 DIAGNOSIS — D2371 Other benign neoplasm of skin of right lower limb, including hip: Secondary | ICD-10-CM | POA: Diagnosis not present

## 2020-11-04 DIAGNOSIS — E1165 Type 2 diabetes mellitus with hyperglycemia: Secondary | ICD-10-CM | POA: Diagnosis not present

## 2020-11-04 DIAGNOSIS — B351 Tinea unguium: Secondary | ICD-10-CM | POA: Diagnosis not present

## 2020-11-04 DIAGNOSIS — M79676 Pain in unspecified toe(s): Secondary | ICD-10-CM

## 2020-11-04 NOTE — Progress Notes (Signed)
He presents today chief complaint of painful elongated toenails and calluses bilateral.  Objective: Pulses are palpable bilateral neurologic sensorium is intact toenails are long thick yellow dystrophic-like mycotic diffuse tylomas plantar aspect of the forefoot bilateral.  Assessment: Benign skin lesions bilateral and pain in limb secondary to onychomycosis.  Plan: Debridement of benign skin lesions and debridement of toenails 1 through 5 bilateral.  Follow-up with him in 4 months

## 2020-11-14 ENCOUNTER — Other Ambulatory Visit: Payer: Self-pay

## 2020-11-14 ENCOUNTER — Ambulatory Visit (HOSPITAL_BASED_OUTPATIENT_CLINIC_OR_DEPARTMENT_OTHER): Payer: Medicare Other | Attending: Sleep Medicine | Admitting: Sleep Medicine

## 2020-11-14 DIAGNOSIS — G4733 Obstructive sleep apnea (adult) (pediatric): Secondary | ICD-10-CM | POA: Diagnosis not present
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
## 2020-11-20 ENCOUNTER — Other Ambulatory Visit: Payer: Self-pay

## 2020-11-20 ENCOUNTER — Ambulatory Visit
Admission: RE | Admit: 2020-11-20 | Discharge: 2020-11-20 | Disposition: A | Discharge status: Home / Self Care | Payer: Medicare Other | Attending: Pain Medicine | Admitting: Pain Medicine

## 2020-11-20 ENCOUNTER — Ambulatory Visit (HOSPITAL_BASED_OUTPATIENT_CLINIC_OR_DEPARTMENT_OTHER): Payer: Medicare Other | Admitting: Pain Medicine

## 2020-11-20 ENCOUNTER — Encounter: Payer: Self-pay | Admitting: Pain Medicine

## 2020-11-20 ENCOUNTER — Ambulatory Visit
Admission: RE | Admit: 2020-11-20 | Discharge: 2020-11-20 | Disposition: A | Discharge status: Home / Self Care | Payer: Medicare Other | Source: Ambulatory Visit | Attending: Pain Medicine | Admitting: Pain Medicine

## 2020-11-20 VITALS — BP 158/78 | HR 62 | Temp 97.4°F | Resp 16 | Ht 70.0 in | Wt 223.0 lb

## 2020-11-20 DIAGNOSIS — M5414 Radiculopathy, thoracic region: Secondary | ICD-10-CM | POA: Insufficient documentation

## 2020-11-20 DIAGNOSIS — M79602 Pain in left arm: Secondary | ICD-10-CM

## 2020-11-20 DIAGNOSIS — M79601 Pain in right arm: Secondary | ICD-10-CM

## 2020-11-20 DIAGNOSIS — M160 Bilateral primary osteoarthritis of hip: Secondary | ICD-10-CM

## 2020-11-20 DIAGNOSIS — Z79891 Long term (current) use of opiate analgesic: Secondary | ICD-10-CM | POA: Insufficient documentation

## 2020-11-20 DIAGNOSIS — Z79899 Other long term (current) drug therapy: Secondary | ICD-10-CM

## 2020-11-20 DIAGNOSIS — M25559 Pain in unspecified hip: Secondary | ICD-10-CM

## 2020-11-20 DIAGNOSIS — G8929 Other chronic pain: Secondary | ICD-10-CM

## 2020-11-20 DIAGNOSIS — M961 Postlaminectomy syndrome, not elsewhere classified: Secondary | ICD-10-CM | POA: Insufficient documentation

## 2020-11-20 DIAGNOSIS — M545 Low back pain, unspecified: Secondary | ICD-10-CM

## 2020-11-20 DIAGNOSIS — M542 Cervicalgia: Secondary | ICD-10-CM | POA: Insufficient documentation

## 2020-11-20 DIAGNOSIS — G894 Chronic pain syndrome: Secondary | ICD-10-CM | POA: Insufficient documentation

## 2020-11-20 MED ORDER — MORPHINE SULFATE ER 15 MG PO TBCR: 15.0000 mg | EXTENDED_RELEASE_TABLET | Freq: Two times a day (BID) | ORAL | 0 refills | Status: DC

## 2020-11-20 MED ORDER — TURMERIC 500 MG PO CAPS: 1000.0000 mg | ORAL_CAPSULE | Freq: Every day | ORAL | 0 refills | Status: DC

## 2020-11-20 NOTE — Progress Notes (Signed)
Nursing Pain Medication Assessment:  Safety precautions to be maintained throughout the outpatient stay will include: orient to surroundings, keep bed in low position, maintain call bell within reach at all times, provide assistance with transfer out of bed and ambulation.  Medication Inspection Compliance: Pill count conducted under aseptic conditions, in front of the patient. Neither the pills nor the bottle was removed from the patient's sight at any time. Once count was completed pills were immediately returned to the patient in their original bottle.  Medication: Morphine ER (MSContin) Pill/Patch Count:  05 of 60 pills remain Pill/Patch Appearance: Markings consistent with prescribed medication Bottle Appearance: Standard pharmacy container. Clearly labeled. Filled Date: 08 / 04 / 2022 Last Medication intake:  Today

## 2020-11-20 NOTE — Patient Instructions (Addendum)
Preparing for your procedure (without sedation) Instructions: Oral Intake: Do not eat or drink anything for at least 3 hours prior to your procedure. Transportation: Unless otherwise stated by your physician, you may drive yourself after the procedure. Blood Pressure Medicine: Take your blood pressure medicine with a sip of water the morning of the procedure. Insulin: Take only  of your normal insulin dose. Preventing infections: Shower with an antibacterial soap the morning of your procedure. Build-up your immune system: Take 1000 mg of Vitamin C with every meal (3 times a day) the day prior to your procedure. Pregnancy: If you are pregnant, call and cancel the procedure. Sickness: If you have a cold, fever, or any active infections, call and cancel the procedure. Arrival: You must be in the facility at least 30 minutes prior to your scheduled procedure. Children: Do not bring any children with you. Dress appropriately: Bring dark clothing that you would not mind if they get stained. Valuables: Do not bring any jewelry or valuables. Procedure appointments are reserved for interventional treatments only. No Prescription Refills. No medication changes will be discussed during procedure appointments. No disability issues will be discussed.    [email or web address]

## 2020-12-01 NOTE — Procedures (Signed)
   NAME: Howard Maxwell DATE OF BIRTH:  Jul 14, 1945 MEDICAL RECORD NUMBER [id number]  LOCATION: [facility]  PHYSICIAN: Candies Palm D Anora Schwenke  DATE OF STUDY: 11/14/2020  SLEEP STUDY TYPE: Inspire titration               REFERRING PHYSICIAN: Elmarie Mainland, MD  CLINICAL INFORMATION Howard Maxwell is a 75 year old Male and was referred to the sleep center for evaluation of Inspire therapy efficacy. Most recent polysomnogram dated 02/21/2020 revealed an AHI of 62.5/h.  MEDICATIONS Patient self administered medications include: N/A. No sleep medicine administered.  SLEEP STUDY TECHNIQUE The patient underwent an attended overnight polysomnography titration to assess the effects of CPAP therapy. The following variables were monitored: EEG(C4-A1, C3-A2, O1-A2, O2-A1), EOG, submental and leg EMG, ECG, oxyhemoglobin saturation by pulse oximetry, thoracic and abdominal respiratory effort belts, nasal/oral airflow by pressure sensor, body position sensor and snoring sensor. CPAP pressure was titrated to eliminate apneas, hypopneas and oxygen desaturation. Hypopneas were scored per AASM definition IB (4% desaturation)  TECHNICIAN COMMENTS Comments added by Technician: None Comments added by Scorer: N/A SLEEP ARCHITECTURE The study was initiated at 10:35:49 PM and terminated at 5:13:46 AM. Total recorded time was 398 minutes. EEG confirmed total sleep time was 217.5 minutes yielding a sleep efficiency of 54.7%. Sleep onset after lights out was 8.7 minutes with a REM latency of 185.5 minutes. The patient spent 28.3% of the night in stage N1 sleep, 64.8% in stage N2 sleep, 0.0% in stage N3 and 6.9% in REM. The Arousal Index was 66.2/hour. RESPIRATORY PARAMETERS The overall AHI was 54.3 per hour, and the RDI was 75.0 events/hour with a central apnea index of 0 per hour. Adequate Inspire therapy setting was not achieved due to poor sleep efficiency. The oxygen nadir was 82.0% during  sleep.    LEG MOVEMENT DATA The total leg movements were 16 with a resulting leg movement index of 4.4/hr. Associated arousal with leg movement index was 0.3/hr. CARDIAC DATA The underlying cardiac rhythm was most consistent with sinus rhythm. Mean heart rate during sleep was 59.6 bpm. Additional rhythm abnormalities include None. DIAGNOSIS - Obstructive Sleep Apnea (G47.33) RECOMMENDATIONS - Electrode configuration and amplitude change in office with repeat titration. - Avoid alcohol, sedatives and other CNS depressants that may worsen sleep apnea and disrupt normal sleep architecture. - Sleep hygiene should be reviewed to assess factors that may improve sleep quality. - Weight management and regular exercise should be initiated or continued.   Jimmi Sidener D Janel Beane Diplomate, American Board of Internal Medicine  ELECTRONICALLY SIGNED ON:  12/01/2020, 9:34 PM [place] PH: [phone]   FX: [phone] [place]

## 2020-12-07 NOTE — Progress Notes (Signed)
PROVIDER NOTE: Information contained herein reflects review and annotations entered in association with encounter. Interpretation of such information and data should be left to medically-trained personnel. Information provided to patient can be located elsewhere in the medical record under "Patient Instructions". Document created using STT-dictation technology, any transcriptional errors that may result from process are unintentional.    Patient: Howard Maxwell  Service Category: Procedure  Provider: Gaspar Cola, MD  DOB: Jun 15, 1945  DOS: 12/10/2020  Location: [facility]  MRN: [id number]  Setting: Ambulatory - outpatient  Referring Provider: Milinda Pointer, MD  Type: Established Patient  Specialty: Interventional Pain Management  PCP: Tracie Harrier, MD   Primary Reason for Visit: Interventional Pain Management Treatment. CC: Hip Pain (hip)    Procedure:          Anesthesia, Analgesia, Anxiolysis:  Type: Intra-Articular Hip Injection #2  Primary Purpose: Therapeutic Region: Posterolateral hip joint area. Level: Lower pelvic and hip joint level. Target Area: Superior aspect of the hip joint cavity, going thru the superior portion of the capsular ligament. Approach: Posterolateral approach. Laterality: Right  Type: Local Anesthesia Local Anesthetic: Lidocaine 1-2% Sedation: None  Indication(s): Anxiety & Analgesia Route: Infiltration ([place]/IM) IV Access: Available   Position: Lateral Decubitus with bad side up Prepped Area: Entire Posterolateral hip area. DuraPrep (Iodine Povacrylex [0.7% available iodine] and Isopropyl Alcohol, 74% w/w)   Indications: 1. Chronic hip pain (Right)   2. Osteoarthritis of hip (Right)   3. BMI 35.0-35.9,adult    Pain Score: Pre-procedure: 5 /10 Post-procedure: 0-No pain/10     Pre-op H&P Assessment:  Howard Maxwell is a 75 y.o. (year old), male patient, seen today for interventional treatment. He  has a past surgical  history that includes Foot surgery (Bilateral); Anterior cervical decomp/discectomy fusion; Gallbladder surgery; Femur fracture surgery; Nasal reconstruction; Cardiac catheterization (12/07/2013); Back surgery; Cholecystectomy; Esophagogastroduodenoscopy (N/A, 12/18/2016); Colonoscopy with propofol (N/A, 12/18/2016); Cervical disc arthroplasty (N/A, 05/28/2017); Cataract extraction w/PHACO (Right, 06/22/2017); eye prosthesis; Anterior lateral lumbar fusion 4 levels (N/A, 08/23/2017); Lumbar percutaneous pedicle screw 4 level (N/A, 08/23/2017); Application of robotic assistance for spinal procedure (N/A, 08/23/2017); Eye surgery (Left, 02/04/2018); Drug induced endoscopy (N/A, 04/17/2020); Achilles tendonitis; Neck surgery (2002); Fracture surgery (6948); Tonsillectomy and adenoidectomy; Esophagogastroduodenoscopy (N/A, 05/10/2020); and Implantation of hypoglossal nerve stimulator (Right, 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 Howard Maxwell has a current medication list which includes the following prescription(s): atorvastatin, azelastine, calcium carb-cholecalciferol, carvedilol, cetirizine, cholecalciferol, coq10, docusate sodium, ferrous sulfate, fluocinonide gel, glipizide, guaifenesin, jardiance, losartan, magnesium, metformin, mirabegron er, morphine, [START ON 12/23/2020] morphine, [START ON 01/22/2021] morphine, moxifloxacin, multiple vitamins-minerals, omega 3, omeprazole, polyethyl glycol-propyl glycol, potassium, spironolactone, tadalafil, turmeric, vitamin b-12, and vitamin c, and the following Facility-Administered Medications: ondansetron (ZOFRAN) 4 mg in sodium chloride 0.9 % 50 mL IVPB. His primarily concern today is the Hip Pain (hip)  Initial Vital Signs:  Pulse/HCG Rate: 61  Temp: (!) 97.2 F (36.2 C) Resp: 16 BP: 130/74 SpO2: 92 %  BMI: Estimated body mass index is 32.28 kg/m as calculated from the following:   Height as of this encounter: 5\' 10"  (1.778 m).   Weight as of this encounter: 225 lb (102.1 kg).  Risk  Assessment: Allergies: Reviewed. He is allergic to loratadine.  Allergy Precautions: None required Coagulopathies: Reviewed. None identified.  Blood-thinner therapy: None at this time Active Infection(s): Reviewed. None identified. Howard Maxwell is afebrile  Site Confirmation: Howard Maxwell was asked to confirm the procedure and laterality before marking the site Procedure checklist: Completed Consent: Before the procedure  and under the influence of no sedative(s), amnesic(s), or anxiolytics, the patient was informed of the treatment options, risks and possible complications. To fulfill our ethical and legal obligations, as recommended by the American Medical Association's Code of Ethics, I have informed the patient of my clinical impression; the nature and purpose of the treatment or procedure; the risks, benefits, and possible complications of the intervention; the alternatives, including doing nothing; the risk(s) and benefit(s) of the alternative treatment(s) or procedure(s); and the risk(s) and benefit(s) of doing nothing. The patient was provided information about the general risks and possible complications associated with the procedure. These may include, but are not limited to: failure to achieve desired goals, infection, bleeding, organ or nerve damage, allergic reactions, paralysis, and death. In addition, the patient was informed of those risks and complications associated to the procedure, such as failure to decrease pain; infection; bleeding; organ or nerve damage with subsequent damage to sensory, motor, and/or autonomic systems, resulting in permanent pain, numbness, and/or weakness of one or several areas of the body; allergic reactions; (i.e.: anaphylactic reaction); and/or death. Furthermore, the patient was informed of those risks and complications associated with the medications. These include, but are not limited to: allergic reactions (i.e.: anaphylactic or anaphylactoid  reaction(s)); adrenal axis suppression; blood sugar elevation that in diabetics may result in ketoacidosis or comma; water retention that in patients with history of congestive heart failure may result in shortness of breath, pulmonary edema, and decompensation with resultant heart failure; weight gain; swelling or edema; medication-induced neural toxicity; particulate matter embolism and blood vessel occlusion with resultant organ, and/or nervous system infarction; and/or aseptic necrosis of one or more joints. Finally, the patient was informed that Medicine is not an exact science; therefore, there is also the possibility of unforeseen or unpredictable risks and/or possible complications that may result in a catastrophic outcome. The patient indicated having understood very clearly. We have given the patient no guarantees and we have made no promises. Enough time was given to the patient to ask questions, all of which were answered to the patient's satisfaction. Howard Maxwell has indicated that he wanted to continue with the procedure. Attestation: I, the ordering provider, attest that I have discussed with the patient the benefits, risks, side-effects, alternatives, likelihood of achieving goals, and potential problems during recovery for the procedure that I have provided informed consent. Date  Time: 12/10/2020 10:57 AM  Pre-Procedure Preparation:  Monitoring: As per clinic protocol. Respiration, ETCO2, SpO2, BP, heart rate and rhythm monitor placed and checked for adequate function Safety Precautions: Patient was assessed for positional comfort and pressure points before starting the procedure. Time-out: I initiated and conducted the "Time-out" before starting the procedure, as per protocol. The patient was asked to participate by confirming the accuracy of the "Time Out" information. Verification of the correct person, site, and procedure were performed and confirmed by me, the nursing staff, and the  patient. "Time-out" conducted as per Joint Commission's Universal Protocol (UP.01.01.01). Time: 1145  Description of Procedure:          Safety Precautions: Aspiration looking for blood return was conducted prior to all injections. At no point did we inject any substances, as a needle was being advanced. No attempts were made at seeking any paresthesias. Safe injection practices and needle disposal techniques used. Medications properly checked for expiration dates. SDV (single dose vial) medications used. Description of the Procedure: Protocol guidelines were followed. The patient was placed in position over the fluoroscopy table. The target  area was identified and the area prepped in the usual manner. Skin & deeper tissues infiltrated with local anesthetic. Appropriate amount of time allowed to pass for local anesthetics to take effect. The procedure needles were then advanced to the target area. Proper needle placement secured. Negative aspiration confirmed. Solution injected in intermittent fashion, asking for systemic symptoms every 0.5cc of injectate. The needles were then removed and the area cleansed, making sure to leave some of the prepping solution back to take advantage of its long term bactericidal properties. Vitals:   12/10/20 1056 12/10/20 1133 12/10/20 1143 12/10/20 1153  BP: 130/74 (!) 148/69 133/60 (!) 134/59  Pulse: 61 61 60 63  Resp: 16 16 15 16   Temp: (!) 97.2 F (36.2 C)     TempSrc: Temporal     SpO2: 92% 94% 96% 97%  Weight: 225 lb (102.1 kg)     Height: 5\' 10"  (1.778 m)       Start Time: 1145 hrs. End Time: 1152 hrs. Materials:  Needle(s) Type: Spinal Needle Gauge: 22G Length: 7.0-in Medication(s): Please see orders for medications and dosing details.  Imaging Guidance (Non-Spinal):          Type of Imaging Technique: Fluoroscopy Guidance (Non-Spinal) Indication(s): Assistance in needle guidance and placement for procedures requiring needle placement in or near  specific anatomical locations not easily accessible without such assistance. Exposure Time: Please see nurses notes. Contrast: Before injecting any contrast, we confirmed that the patient did not have an allergy to iodine, shellfish, or radiological contrast. Once satisfactory needle placement was completed at the desired level, radiological contrast was injected. Contrast injected under live fluoroscopy. No contrast complications. See chart for type and volume of contrast used. Fluoroscopic Guidance: I was personally present during the use of fluoroscopy. "Tunnel Vision Technique" used to obtain the best possible view of the target area. Parallax error corrected before commencing the procedure. "Direction-depth-direction" technique used to introduce the needle under continuous pulsed fluoroscopy. Once target was reached, antero-posterior, oblique, and lateral fluoroscopic projection used confirm needle placement in all planes. Images permanently stored in EMR. Interpretation: I personally interpreted the imaging intraoperatively. Adequate needle placement confirmed in multiple planes. Appropriate spread of contrast into desired area was observed. No evidence of afferent or efferent intravascular uptake. Permanent images saved into the patient's record.  Antibiotic Prophylaxis:   Anti-infectives (From admission, onward)    None      Indication(s): None identified  Post-operative Assessment:  Post-procedure Vital Signs:  Pulse/HCG Rate: 63 (irregular)  Temp: (!) 97.2 F (36.2 C) Resp: 16 BP: (!) 134/59 SpO2: 97 %  EBL: None  Complications: No immediate post-treatment complications observed by team, or reported by patient.  Note: The patient tolerated the entire procedure well. A repeat set of vitals were taken after the procedure and the patient was kept under observation following institutional policy, for this type of procedure. Post-procedural neurological assessment was performed,  showing return to baseline, prior to discharge. The patient was provided with post-procedure discharge instructions, including a section on how to identify potential problems. Should any problems arise concerning this procedure, the patient was given instructions to immediately contact us, at any time, without hesitation. In any case, we plan to contact the patient by telephone for a follow-up status report regarding this interventional procedure.  Comments:  No additional relevant information.  Plan of Care  Orders:  Orders Placed This Encounter  Procedures   HIP INJECTION    Scheduling Instructions:     Side:  Right-sided     Sedation: Patient's choice.     Timeframe: Today   DG PAIN [facility] C-ARM 1-60 MIN NO REPORT    Intraoperative interpretation by procedural physician at [place].    Standing Status:   Standing    Number of Occurrences:   1    Order Specific Question:   Reason for exam:    Answer:   Assistance in needle guidance and placement for procedures requiring needle placement in or near specific anatomical locations not easily accessible without such assistance.   Informed Consent Details: Physician/Practitioner Attestation; Transcribe to consent form and obtain patient signature    Nursing Order: Transcribe to consent form and obtain patient signature. Note: Always confirm laterality of pain with Howard Maxwell, before procedure.    Order Specific Question:   Physician/Practitioner attestation of informed consent for procedure/surgical case    Answer:   I, the physician/practitioner, attest that I have discussed with the patient the benefits, risks, side effects, alternatives, likelihood of achieving goals and potential problems during recovery for the procedure that I have provided informed consent.    Order Specific Question:   Procedure    Answer:   Hip injection    Order Specific Question:   Physician/Practitioner performing the procedure    Answer:   Kataleena Holsapple  A. Dossie Arbour, MD    Order Specific Question:   Indication/Reason    Answer:   Hip Joint Pain (Arthralgia)   Provide equipment / supplies at bedside    "Block Tray" (Disposable  single use) Needle type: SpinalSpinal Amount/quantity: 1 Size: Long (7-inch) Gauge: 22G    Standing Status:   Standing    Number of Occurrences:   1    Order Specific Question:   Specify    Answer:   Block Tray    Chronic Opioid Analgesic:  Previously on: Oxycodone IR 5 mg 1 tablet p.o. every 6 hours (20 mg/day of oxycodone).  Now on: Morphine ER (MS Contin) 15 mg every 12 hours (30 mg/day of morphine) (30 MME/day).  Due to national shortage. MME/day: 30 mg/day.   Medications ordered for procedure: Meds ordered this encounter  Medications   iohexol (OMNIPAQUE) 180 MG/ML injection 10 mL    Must be Myelogram-compatible. If not available, you may substitute with a water-soluble, non-ionic, hypoallergenic, myelogram-compatible radiological contrast medium.   lidocaine (XYLOCAINE) 2 % (with pres) injection 400 mg   pentafluoroprop-tetrafluoroeth (GEBAUERS) aerosol   ropivacaine (PF) 2 mg/mL (0.2%) (NAROPIN) injection 18 mL   methylPREDNISolone acetate (DEPO-MEDROL) injection 160 mg    Medications administered: We administered iohexol, lidocaine, pentafluoroprop-tetrafluoroeth, ropivacaine (PF) 2 mg/mL (0.2%), and methylPREDNISolone acetate.  See the medical record for exact dosing, route, and time of administration.  Follow-up plan:   Return in about 2 weeks (around 12/24/2020) for Proc-day (T,Th), (VV), (PPE).       Interventional Therapies  Risk  Complexity Considerations:   Estimated body mass index is 33.72 kg/m as calculated from the following:   Height as of this encounter: 5\' 10"  (1.778 m).   Weight as of this encounter: 235 lb (106.6 kg). WNL   Planned  Pending:   Pending further evaluation   Under consideration:   Diagnostic bilateral lumbar facet MBB  Diagnostic L3-4 interspinous ligament  block  Possible L3-4 bilateral medial branch RFA  Diagnostic right L2-3 and/or L3-4 LESI  Diagnostic bilateral L1 & L4 TFESI  Diagnostic bilateral L2 TFESI  Diagnostic caudal ESI + Dx epidurogram  Diagnostic bilateral femoral and obturator  NB  Diagnostic bilateral cervical facet MBB  Diagnostic left Genicular NB    Completed:   Palliative left Hyalgan knee injection x4 (01/28/2017)  Therapeutic bilateral L2 TFESI x1 (10/22/2016)  Therapeutic right L3-4 LESI x1 (12/01/2016)  Therapeutic/palliative right IA hip inj. x1 (09/07/2019) (100/100/90/90)  Therapeutic left SSNB x1 (11/30/2019)  Diagnostic/therapeutic left CESI x1 (02/06/2020)    Therapeutic  Palliative (PRN) options:   Palliative left Knee Hyalgan injection  Therapeutic bilateral L2 TFESI #2  Therapeutic right L3-4 LESI #2  Palliative right IA hip joint injection #2  Diagnostic left suprascapular NB #2  Diagnostic/therapeutic left CESI #2       Recent Visits Date Type Provider Dept  11/20/20 Office Visit Milinda Pointer, MD [facility]  Showing recent visits within past 90 days and meeting all other requirements Today's Visits Date Type Provider Dept  12/10/20 Procedure visit Milinda Pointer, MD [facility]  Showing today's visits and meeting all other requirements Future Appointments Date Type Provider Dept  12/26/20 Appointment Milinda Pointer, MD [facility]  02/19/21 Appointment Milinda Pointer, MD [facility]  Showing future appointments within next 90 days and meeting all other requirements Disposition: Discharge home  Discharge (Date  Time): 12/10/2020; 1159 hrs.   Primary Care Physician: Tracie Harrier, MD Location: [facility] Note by: Gaspar Cola, MD Date: 12/10/2020; Time: 12:13 PM  Disclaimer:  Medicine is not an Chief Strategy Officer. The only guarantee in medicine is that nothing is guaranteed. It is important to note  that the decision to proceed with this intervention was based on the information collected from the patient. The Data and conclusions were drawn from the patient's questionnaire, the interview, and the physical examination. Because the information was provided in large part by the patient, it cannot be guaranteed that it has not been purposely or unconsciously manipulated. Every effort has been made to obtain as much relevant data as possible for this evaluation. It is important to note that the conclusions that lead to this procedure are derived in large part from the available data. Always take into account that the treatment will also be dependent on availability of resources and existing treatment guidelines, considered by other Pain Management Practitioners as being common knowledge and practice, at the time of the intervention. For Medico-Legal purposes, it is also important to point out that variation in procedural techniques and pharmacological choices are the acceptable norm. The indications, contraindications, technique, and results of the above procedure should only be interpreted and judged by a Board-Certified Interventional Pain Specialist with extensive familiarity and expertise in the same exact procedure and technique.

## 2020-12-10 ENCOUNTER — Ambulatory Visit (HOSPITAL_BASED_OUTPATIENT_CLINIC_OR_DEPARTMENT_OTHER): Payer: Medicare Other | Admitting: Pain Medicine

## 2020-12-10 ENCOUNTER — Ambulatory Visit
Admission: RE | Admit: 2020-12-10 | Discharge: 2020-12-10 | Disposition: A | Discharge status: Home / Self Care | Payer: Medicare Other | Source: Ambulatory Visit | Attending: Pain Medicine | Admitting: Pain Medicine

## 2020-12-10 ENCOUNTER — Other Ambulatory Visit: Payer: Self-pay

## 2020-12-10 VITALS — BP 134/59 | HR 63 | Temp 97.2°F | Resp 16 | Ht 70.0 in | Wt 225.0 lb

## 2020-12-10 DIAGNOSIS — Z6835 Body mass index (BMI) 35.0-35.9, adult: Secondary | ICD-10-CM | POA: Diagnosis present

## 2020-12-10 DIAGNOSIS — M25551 Pain in right hip: Secondary | ICD-10-CM

## 2020-12-10 DIAGNOSIS — M25559 Pain in unspecified hip: Secondary | ICD-10-CM | POA: Insufficient documentation

## 2020-12-10 DIAGNOSIS — G8929 Other chronic pain: Secondary | ICD-10-CM | POA: Insufficient documentation

## 2020-12-10 DIAGNOSIS — M1611 Unilateral primary osteoarthritis, right hip: Secondary | ICD-10-CM | POA: Diagnosis present

## 2020-12-10 DIAGNOSIS — M160 Bilateral primary osteoarthritis of hip: Secondary | ICD-10-CM | POA: Insufficient documentation

## 2020-12-10 MED ORDER — ROPIVACAINE HCL 2 MG/ML IJ SOLN
18.0000 mL | Freq: Once | INTRAMUSCULAR | Status: AC
  Administered 2020-12-10: 18 mL via INTRA_ARTICULAR

## 2020-12-10 MED ORDER — LIDOCAINE HCL (PF) 2 % IJ SOLN
INTRAMUSCULAR | Status: AC
  Filled 2020-12-10: qty 20

## 2020-12-10 MED ORDER — LIDOCAINE HCL 2 % IJ SOLN
20.0000 mL | Freq: Once | INTRAMUSCULAR | Status: AC
  Administered 2020-12-10: 400 mg
  Filled 2020-12-10: qty 20

## 2020-12-10 MED ORDER — PENTAFLUOROPROP-TETRAFLUOROETH EX AERO
INHALATION_SPRAY | Freq: Once | CUTANEOUS | Status: AC
  Administered 2020-12-10: 30 via TOPICAL
  Filled 2020-12-10: qty 116

## 2020-12-10 MED ORDER — IOHEXOL 180 MG/ML  SOLN
10.0000 mL | Freq: Once | INTRAMUSCULAR | Status: AC
  Administered 2020-12-10: 10 mL via INTRA_ARTICULAR

## 2020-12-10 MED ORDER — ROPIVACAINE HCL 2 MG/ML IJ SOLN
INTRAMUSCULAR | Status: AC
  Filled 2020-12-10: qty 20

## 2020-12-10 MED ORDER — METHYLPREDNISOLONE ACETATE 80 MG/ML IJ SUSP
160.0000 mg | Freq: Once | INTRAMUSCULAR | Status: AC
  Administered 2020-12-10: 160 mg via INTRA_ARTICULAR
  Filled 2020-12-10: qty 2

## 2020-12-10 NOTE — Patient Instructions (Signed)

## 2020-12-11 ENCOUNTER — Telehealth: Payer: Self-pay

## 2020-12-11 NOTE — Telephone Encounter (Signed)
Post procedure phone call. Patient states he is doing well.  

## 2020-12-23 ENCOUNTER — Telehealth: Payer: Self-pay | Admitting: Pain Medicine

## 2020-12-23 NOTE — Telephone Encounter (Signed)
Patient wants an earlier appointment that 10/6 for his PPE due to increased pain. Please see if he can be worked in earlier and call him with new appointment if available. Thank you.

## 2020-12-23 NOTE — Telephone Encounter (Signed)
I have reached out to Dr. Andree Elk regarding this to see if we can work him in earlier.

## 2020-12-23 NOTE — Telephone Encounter (Signed)
Patient lvmail at 8:03 saying he had injection and even tried some physical therapy and the pain has not let up at all. Please advise patient. He says pain is very bad

## 2020-12-24 ENCOUNTER — Other Ambulatory Visit: Payer: Self-pay | Admitting: Cardiovascular Disease

## 2020-12-24 ENCOUNTER — Encounter: Payer: Self-pay | Admitting: Pain Medicine

## 2020-12-25 ENCOUNTER — Other Ambulatory Visit: Payer: Self-pay

## 2020-12-25 ENCOUNTER — Ambulatory Visit: Payer: Medicare Other | Attending: Pain Medicine | Admitting: Pain Medicine

## 2020-12-25 DIAGNOSIS — R937 Abnormal findings on diagnostic imaging of other parts of musculoskeletal system: Secondary | ICD-10-CM

## 2020-12-25 DIAGNOSIS — M25559 Pain in unspecified hip: Secondary | ICD-10-CM

## 2020-12-25 DIAGNOSIS — M5414 Radiculopathy, thoracic region: Secondary | ICD-10-CM

## 2020-12-25 DIAGNOSIS — M7138 Other bursal cyst, other site: Secondary | ICD-10-CM

## 2020-12-25 DIAGNOSIS — M1611 Unilateral primary osteoarthritis, right hip: Secondary | ICD-10-CM

## 2020-12-25 DIAGNOSIS — M79601 Pain in right arm: Secondary | ICD-10-CM

## 2020-12-25 DIAGNOSIS — M5416 Radiculopathy, lumbar region: Secondary | ICD-10-CM

## 2020-12-25 DIAGNOSIS — M48062 Spinal stenosis, lumbar region with neurogenic claudication: Secondary | ICD-10-CM

## 2020-12-25 DIAGNOSIS — M48061 Spinal stenosis, lumbar region without neurogenic claudication: Secondary | ICD-10-CM

## 2020-12-25 DIAGNOSIS — M542 Cervicalgia: Secondary | ICD-10-CM

## 2020-12-25 DIAGNOSIS — R29898 Other symptoms and signs involving the musculoskeletal system: Secondary | ICD-10-CM

## 2020-12-25 DIAGNOSIS — M47816 Spondylosis without myelopathy or radiculopathy, lumbar region: Secondary | ICD-10-CM

## 2020-12-25 DIAGNOSIS — M4820 Kissing spine, site unspecified: Secondary | ICD-10-CM

## 2020-12-25 DIAGNOSIS — M545 Low back pain, unspecified: Secondary | ICD-10-CM

## 2020-12-25 DIAGNOSIS — G894 Chronic pain syndrome: Secondary | ICD-10-CM

## 2020-12-25 DIAGNOSIS — M25551 Pain in right hip: Secondary | ICD-10-CM

## 2020-12-25 DIAGNOSIS — M5136 Other intervertebral disc degeneration, lumbar region: Secondary | ICD-10-CM

## 2020-12-25 DIAGNOSIS — G8929 Other chronic pain: Secondary | ICD-10-CM

## 2020-12-25 DIAGNOSIS — M79602 Pain in left arm: Secondary | ICD-10-CM

## 2020-12-25 DIAGNOSIS — M961 Postlaminectomy syndrome, not elsewhere classified: Secondary | ICD-10-CM

## 2020-12-25 DIAGNOSIS — M160 Bilateral primary osteoarthritis of hip: Secondary | ICD-10-CM
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
## 2020-12-26 ENCOUNTER — Telehealth: Payer: Medicare Other | Admitting: Pain Medicine

## 2020-12-30 DIAGNOSIS — I700 Atherosclerosis of aorta: Secondary | ICD-10-CM | POA: Insufficient documentation

## 2021-01-17 ENCOUNTER — Other Ambulatory Visit: Payer: Medicare Other

## 2021-01-20 ENCOUNTER — Telehealth: Payer: Medicare Other | Admitting: Pain Medicine

## 2021-01-20 ENCOUNTER — Other Ambulatory Visit: Payer: Self-pay

## 2021-01-20 ENCOUNTER — Ambulatory Visit
Admission: RE | Admit: 2021-01-20 | Discharge: 2021-01-20 | Disposition: A | Discharge status: Home / Self Care | Payer: Medicare Other | Source: Ambulatory Visit | Attending: Pain Medicine | Admitting: Pain Medicine

## 2021-01-20 DIAGNOSIS — M961 Postlaminectomy syndrome, not elsewhere classified: Secondary | ICD-10-CM | POA: Diagnosis present

## 2021-01-20 DIAGNOSIS — M5136 Other intervertebral disc degeneration, lumbar region: Secondary | ICD-10-CM | POA: Diagnosis present

## 2021-01-20 DIAGNOSIS — M25559 Pain in unspecified hip: Secondary | ICD-10-CM | POA: Insufficient documentation

## 2021-01-20 DIAGNOSIS — M545 Low back pain, unspecified: Secondary | ICD-10-CM | POA: Insufficient documentation

## 2021-01-20 DIAGNOSIS — M25551 Pain in right hip: Secondary | ICD-10-CM | POA: Insufficient documentation

## 2021-01-20 DIAGNOSIS — M48061 Spinal stenosis, lumbar region without neurogenic claudication: Secondary | ICD-10-CM | POA: Diagnosis present

## 2021-01-20 DIAGNOSIS — M48062 Spinal stenosis, lumbar region with neurogenic claudication: Secondary | ICD-10-CM | POA: Diagnosis present

## 2021-01-20 DIAGNOSIS — M4820 Kissing spine, site unspecified: Secondary | ICD-10-CM | POA: Diagnosis present

## 2021-01-20 DIAGNOSIS — G8929 Other chronic pain: Secondary | ICD-10-CM | POA: Diagnosis present

## 2021-01-20 DIAGNOSIS — R937 Abnormal findings on diagnostic imaging of other parts of musculoskeletal system: Secondary | ICD-10-CM | POA: Insufficient documentation

## 2021-01-20 DIAGNOSIS — M5416 Radiculopathy, lumbar region: Secondary | ICD-10-CM | POA: Diagnosis present

## 2021-01-20 DIAGNOSIS — M7138 Other bursal cyst, other site: Secondary | ICD-10-CM | POA: Insufficient documentation

## 2021-01-20 DIAGNOSIS — M47816 Spondylosis without myelopathy or radiculopathy, lumbar region: Secondary | ICD-10-CM | POA: Diagnosis present

## 2021-01-20 DIAGNOSIS — R29898 Other symptoms and signs involving the musculoskeletal system: Secondary | ICD-10-CM | POA: Diagnosis present

## 2021-01-23 ENCOUNTER — Other Ambulatory Visit: Payer: Self-pay

## 2021-01-23 ENCOUNTER — Encounter: Payer: Self-pay | Admitting: Urology

## 2021-01-23 ENCOUNTER — Ambulatory Visit: Payer: Medicare Other | Admitting: Urology

## 2021-01-23 DIAGNOSIS — R3915 Urgency of urination: Secondary | ICD-10-CM

## 2021-01-23 DIAGNOSIS — N3281 Overactive bladder: Secondary | ICD-10-CM | POA: Diagnosis not present

## 2021-01-23 MED ORDER — MIRABEGRON ER 50 MG PO TB24: 50.0000 mg | ORAL_TABLET | Freq: Every day | ORAL | 3 refills | Status: DC

## 2021-01-23 NOTE — Progress Notes (Signed)
   01/23/2021 10:44 AM   Howard Maxwell 10/04/1945 [id number]  Reason for visit: Follow up overactive bladder  HPI:  He is a 75 year old male with chronic back pain and multiple back surgeries who has a long history of urinary urgency, frequency, and urge incontinence.  When I first met him, he was voiding every 30 minutes during the day, and every 1-2 hours overnight, as well as having episodes of urge incontinence on trips.  He had a negative hematuria work-up in 2005 and has an extensive smoking history, as well as poorly controlled diabetes, and sleep apnea not on CPAP.  He is currently on Myrbetriq 50 mg daily which significantly improves his urinary symptoms.  Secondary to the cost of the medication, he is really only taking it as needed when he goes out of his house or on long trips, but when he takes that he feels like it makes a significant difference in his symptoms.  He denies any gross hematuria or dysuria.  PSA recently checked and was normal at 0.29.  Would not recommend further PSA screening with his age and number of comorbidities per the AUA guideline recommendations.  PVR is normal today 0 mL.  He reports his symptoms are stable.  He is interested in other options, including PTNS which we had previously discussed.  Schedule PTNS x12 sessions, may be a good candidate for eCoin in the future Myrbetriq refilled    [place], [place] [address], [place], [place] [address] [phone]

## 2021-01-23 NOTE — Patient Instructions (Signed)
Will call with cost of PTNS and authorization    Urgent PC office-based treatment for overactive bladder Take back control of your life! Urgent PC is a non-drug, non-surgical option for overactive bladder and associated symptoms of urinary urgency, urinary frequency and urge incontinence  Urgent-PC- Since 2003, healthcare professionals have used the Urgent PC Neuromodulation System as an effective office treatment for men and women suffering from overactive bladder, a condition commonly referred to as OAB. Urgent PC is up to 80% effective, even after conservative measures and OAB drugs have failed. Plus, Urgent PC is very low risk, making it a great choice for people unable or unwilling to have more invasive procedures.  How does Urgent PC work?  The Urgent PC system delivers a specific type of neuromodulation called percutaneous tibial nerve stimulation (PTNS). During treatment, a small, slim needle electrode is inserted near your ankle. The needle electrode is then connected to the battery-powered stimulator. During your 30-minute treatment, mild impulses from the stimulator travel through the needle electrode, along your leg and to the nerves in your pelvis that control bladder function. This process is also referred to as neuromodulation.  What will I feel with Urgent PC therapy?  Because patients may experience the sensation of the Urgent PC therapy in different ways, it's difficult to say what the treatment would feel like to you. Patients often describe the sensation as "tingling" or "pulsating." Treatment is typically well-tolerated by patients. Urgent PC offers many different levels of stimulation, so your clinician will be able to adjust treatment to suit you as well as address any discomfort that you might experience during treatment.  How often will I need Urgent PC treatments? You will receive an initial series of 12 treatments scheduled about a week apart. If you respond, you will  likely need a treatment about once per month to maintain your improvements.  How soon will I see results with Urgent PC? Because Urgent PC gently modifies the signals to achieve bladder control, it usually takes 5-7 weeks for symptoms to change. However, patients respond at different rates. In a review of about 100 patients who had success with Urgent PC, symptoms improved anywhere between 2-12 weeks. For about 20% of these patients, the symptoms of urgency and/or urge incontinence didn't improve until after 8 weeks.1  There is no way to anticipate who will respond earlier, later or not at all. That's why it is important to receive the 12 recommended treatments before you and your physician evaluate whether this therapy is an appropriate and effective choice for you.  How can I receive treatment with Urgent PC? Urgent PC is an option for patients with OAB. If you think you have OAB, talk to your doctor, a urologist or urogynecologist. If you have OAB, the doctor will work with you to determine your own personal treatment plan which usually starts with behavior and diet modifications plus medications. Urgent PC is an excellent option if these options don't work or provide sufficient improvements. Treatment with Urgent PC is typically performed at the office of a urologist, urogynecologist or gynecologist. So, if you run out of options with your normal doctor, consider visiting one of these specialists.  Does insurance cover Urgent PC? Urgent PC treatment is reimbursed by Medicare across the [place]. Private insurance coverage varies by state. To see if your insurance company covers Urgent PC, use our Coverage Finder or talk to your Healthcare Provider.  Are there patients who should not be treated with Urgent PC?  Yes, these include: patients with pacemakers or implantable defibrillators, patients prone to excessive bleeding, patients with nerve damage that could impact either percutaneous tibial  nerve or pelvic floor function and patients who are pregnant or planning to become pregnant during the duration of the treatment.  What are the risks associated with Urgent PC? The risks associated with Urgent PC therapy are low. Most common side-effects are temporary and include mild pain or skin inflammation at or near the stimulation site.

## 2021-01-31 ENCOUNTER — Telehealth: Payer: Self-pay

## 2021-01-31 NOTE — Telephone Encounter (Signed)
-----   Message from Despina Hidden, [place] sent at 01/23/2021 10:14 AM EDT ----- Regarding: PTNS Pt would like to start PTNS and know the cost

## 2021-01-31 NOTE — Telephone Encounter (Signed)
No PA required. 12 PTNS appts booked, patient confirmed appts.

## 2021-02-03 ENCOUNTER — Telehealth: Payer: Self-pay | Admitting: Urology

## 2021-02-03 ENCOUNTER — Telehealth: Payer: Self-pay | Admitting: Pain Medicine

## 2021-02-03 NOTE — Telephone Encounter (Signed)
Called and talked with patient. He states that his insurance company told him that he had a self admistred medication. After talking with patient he is referring to the sold spray that is used before the procedure is done. Patient will call insurance back and will also inform insurance company of this.

## 2021-02-03 NOTE — Telephone Encounter (Signed)
Pt LMOM that he needed to cancel his PTNS series due to it not being safe w/Inspire device he has implanted in his chest.  He contacted the company and they said it wasn't safe, so pt wants to cancel all appts.

## 2021-02-03 NOTE — Telephone Encounter (Signed)
Patient Howard Maxwell 01-31-21 stating he got a bill for a self administered medication when he came for procedure on 12-10-20. Says he did not take anything self administered. Please check billing and call patient to advise. Thank you

## 2021-02-04 ENCOUNTER — Encounter: Payer: Self-pay | Admitting: Pain Medicine

## 2021-02-04 ENCOUNTER — Ambulatory Visit: Payer: Medicare Other
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
## 2021-02-05 ENCOUNTER — Other Ambulatory Visit: Payer: Self-pay

## 2021-02-05 ENCOUNTER — Ambulatory Visit: Payer: Medicare Other | Attending: Pain Medicine | Admitting: Pain Medicine

## 2021-02-05 DIAGNOSIS — M5414 Radiculopathy, thoracic region: Secondary | ICD-10-CM

## 2021-02-05 DIAGNOSIS — G894 Chronic pain syndrome: Secondary | ICD-10-CM

## 2021-02-05 DIAGNOSIS — M961 Postlaminectomy syndrome, not elsewhere classified: Secondary | ICD-10-CM

## 2021-02-05 DIAGNOSIS — M79601 Pain in right arm: Secondary | ICD-10-CM

## 2021-02-05 DIAGNOSIS — M79602 Pain in left arm: Secondary | ICD-10-CM

## 2021-02-05 DIAGNOSIS — Z79891 Long term (current) use of opiate analgesic: Secondary | ICD-10-CM | POA: Diagnosis not present

## 2021-02-05 DIAGNOSIS — M542 Cervicalgia: Secondary | ICD-10-CM

## 2021-02-05 DIAGNOSIS — Z79899 Other long term (current) drug therapy: Secondary | ICD-10-CM | POA: Diagnosis not present

## 2021-02-05 DIAGNOSIS — M25559 Pain in unspecified hip: Secondary | ICD-10-CM

## 2021-02-05 DIAGNOSIS — G8929 Other chronic pain: Secondary | ICD-10-CM

## 2021-02-05 DIAGNOSIS — M545 Low back pain, unspecified: Secondary | ICD-10-CM

## 2021-02-05 MED ORDER — MORPHINE SULFATE ER 15 MG PO TBCR: 15.0000 mg | EXTENDED_RELEASE_TABLET | Freq: Two times a day (BID) | ORAL | 0 refills | Status: DC

## 2021-02-08 ENCOUNTER — Other Ambulatory Visit: Payer: Self-pay | Admitting: Cardiovascular Disease

## 2021-02-10 ENCOUNTER — Ambulatory Visit: Payer: Medicare Other

## 2021-02-17 ENCOUNTER — Ambulatory Visit: Payer: Medicare Other

## 2021-02-19 ENCOUNTER — Ambulatory Visit: Payer: Medicare Other | Attending: Pain Medicine | Admitting: Pain Medicine

## 2021-02-19 ENCOUNTER — Encounter: Payer: Self-pay | Admitting: Pain Medicine

## 2021-02-19 ENCOUNTER — Other Ambulatory Visit: Payer: Self-pay

## 2021-02-19 VITALS — BP 129/64 | HR 60 | Temp 97.5°F | Ht 70.0 in | Wt 225.0 lb

## 2021-02-19 DIAGNOSIS — M542 Cervicalgia: Secondary | ICD-10-CM | POA: Insufficient documentation

## 2021-02-19 DIAGNOSIS — M79602 Pain in left arm: Secondary | ICD-10-CM | POA: Insufficient documentation

## 2021-02-19 DIAGNOSIS — Z79891 Long term (current) use of opiate analgesic: Secondary | ICD-10-CM | POA: Insufficient documentation

## 2021-02-19 DIAGNOSIS — G8929 Other chronic pain: Secondary | ICD-10-CM | POA: Diagnosis present

## 2021-02-19 DIAGNOSIS — M545 Low back pain, unspecified: Secondary | ICD-10-CM | POA: Insufficient documentation

## 2021-02-19 DIAGNOSIS — Z79899 Other long term (current) drug therapy: Secondary | ICD-10-CM | POA: Insufficient documentation

## 2021-02-19 DIAGNOSIS — M961 Postlaminectomy syndrome, not elsewhere classified: Secondary | ICD-10-CM | POA: Insufficient documentation

## 2021-02-19 DIAGNOSIS — M5414 Radiculopathy, thoracic region: Secondary | ICD-10-CM | POA: Insufficient documentation

## 2021-02-19 DIAGNOSIS — G894 Chronic pain syndrome: Secondary | ICD-10-CM | POA: Insufficient documentation

## 2021-02-19 DIAGNOSIS — M25559 Pain in unspecified hip: Secondary | ICD-10-CM | POA: Diagnosis not present

## 2021-02-19 DIAGNOSIS — M79601 Pain in right arm: Secondary | ICD-10-CM | POA: Diagnosis not present

## 2021-02-19 MED ORDER — MORPHINE SULFATE ER 15 MG PO TBCR: 15.0000 mg | EXTENDED_RELEASE_TABLET | Freq: Two times a day (BID) | ORAL | 0 refills | Status: DC

## 2021-02-19 NOTE — Patient Instructions (Signed)
Medication Rules  Applies to: All patients receiving prescriptions (written or electronic).  Pharmacy of record: Pharmacy where electronic prescriptions will be sent. If written prescriptions are taken to a different pharmacy, please inform the nursing staff. The pharmacy listed in the electronic medical record should be the one where you would like electronic prescriptions to be sent.  Prescription refills: Only during scheduled appointments. Applies to both, written and electronic prescriptions.  NOTE: The following applies primarily to controlled substances (Opioid* Pain Medications).   Patient's responsibilities: Pain Pills: Bring all pain pills to every appointment (except for procedure appointments). Pill Bottles: Bring pills in original pharmacy bottle. Always bring newest bottle. Bring bottle, even if empty. Medication refills: You are responsible for knowing and keeping track of what medications you need refilled. The day before your appointment, write a list of all prescriptions that need to be refilled. Bring that list to your appointment and give it to the admitting nurse. Prescriptions will be written only during appointments. If you forget a medication, it will not be "Called in", "Faxed", or "electronically sent". You will need to get another appointment to get these prescribed. Prescription Accuracy: You are responsible for carefully inspecting your prescriptions before leaving our office. Have the discharge nurse carefully go over each prescription with you, before taking them home. Make sure that your name is accurately spelled, that your address is correct. Check the name and dose of your medication to make sure it is accurate. Check the number of pills, and the written instructions to make sure they are clear and accurate. Make sure that you are given enough medication to last until your next medication refill appointment. Taking Medication: Take medication as prescribed. Never  take more pills than instructed. Never take medication more frequently than prescribed. Taking less pills or less frequently is permitted and encouraged, when it comes to controlled substances (written prescriptions).  Inform other Doctors: Always inform, all of your healthcare providers, of all the medications you take. Pain Medication from other Providers: You are not allowed to accept any additional pain medication from any other Doctor or Healthcare provider. There are two exceptions to this rule. (see below) In the event that you require additional pain medication, you are responsible for notifying us, as stated below. Medication Agreement: You are responsible for carefully reading and following our Medication Agreement. This must be signed before receiving any prescriptions from our practice. Safely store a copy of your signed Agreement. Violations to the Agreement will result in no further prescriptions. (Additional copies of our Medication Agreement are available upon request.) Laws, Rules, & Regulations: All patients are expected to follow all Federal and [employer], Rules, [employer]. Ignorance of the Laws does not constitute a valid excuse. The use of any illegal substances is prohibited. Adopted CDC guidelines & recommendations: Target dosing levels will be at or below 60 MME/day. Use of benzodiazepines** is not recommended.  Exceptions: There are only two exceptions to the rule of not receiving pain medications from other Healthcare Providers. Exception #1 (Emergencies): In the event of an emergency (i.e.: accident requiring emergency care), you are allowed to receive additional pain medication. However, you are responsible for: As soon as you are able, call our office [phone], at any time of the day or night, and leave a message stating your name, the date and nature of the emergency, and the name and dose of the medication prescribed. In the event that your call is answered  by a member of  our staff, make sure to document and save the date, time, and the name of the person that took your information.  Exception #2 (Planned Surgery): In the event that you are scheduled by another doctor or dentist to have any type of surgery or procedure, you are allowed (for a period no longer than 30 days), to receive additional pain medication, for the acute post-op pain. However, in this case, you are responsible for picking up a copy of our "Post-op Pain Management for Surgeons" handout, and giving it to your surgeon or dentist. This document is available at our office, and does not require an appointment to obtain it. Simply go to our office during business hours (Monday-Thursday from 8:00 AM to 4:00 PM) (Friday 8:00 AM to 12:00 Noon) or if you have a scheduled appointment with [place], prior to your surgery, and ask for it by name. In addition, you will need to provide [place] with your name, name of your surgeon, type of surgery, and date of procedure or surgery.  *Opioid medications include: morphine, codeine, oxycodone, oxymorphone, hydrocodone, hydromorphone, meperidine, tramadol, tapentadol, buprenorphine, fentanyl, methadone. **Benzodiazepine medications include: diazepam (Valium), alprazolam (Xanax), clonazepam (Klonopine), lorazepam (Ativan), clorazepate (Tranxene), chlordiazepoxide (Librium), estazolam (Prosom), oxazepam (Serax), temazepam (Restoril), triazolam (Halcion) (Last updated: 05/20/2017)

## 2021-02-19 NOTE — Progress Notes (Signed)
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 of encounter: 45 minutes.  Note by: Gaspar Cola, MD Date: 02/19/2021; Time: 9:49 AM

## 2021-02-19 NOTE — Progress Notes (Addendum)
[facility] requesting Nerve Conduction test faxed to [facility] at fax number [phone].   States she will contact their nurse to get that faxed over.   ROI faxed to above clinic as well.   Al Decant, RN

## 2021-02-19 NOTE — Progress Notes (Signed)
Safety precautions to be maintained throughout the outpatient stay will include: orient to surroundings, keep bed in low position, maintain call bell within reach at all times, provide assistance with transfer out of bed and ambulation.   Nursing Pain Medication Assessment:  Safety precautions to be maintained throughout the outpatient stay will include: orient to surroundings, keep bed in low position, maintain call bell within reach at all times, provide assistance with transfer out of bed and ambulation.  Medication Inspection Compliance: Pill count conducted under aseptic conditions, in front of the patient. Neither the pills nor the bottle was removed from the patient's sight at any time. Once count was completed pills were immediately returned to the patient in their original bottle.  Medication: Morphine ER (MSContin) Pill/Patch Count:  3 of 60 pills remain Pill/Patch Appearance: Markings consistent with prescribed medication Bottle Appearance: Standard pharmacy container. Clearly labeled. Filled Date: 55 / 02 / 2022 Last Medication intake:  Today

## 2021-02-20 ENCOUNTER — Other Ambulatory Visit: Payer: Self-pay | Admitting: Pain Medicine

## 2021-02-24 ENCOUNTER — Ambulatory Visit: Payer: Medicare Other

## 2021-02-26 ENCOUNTER — Other Ambulatory Visit: Payer: Self-pay

## 2021-02-26 ENCOUNTER — Ambulatory Visit
Admission: RE | Admit: 2021-02-26 | Discharge: 2021-02-26 | Disposition: A | Discharge status: Home / Self Care | Payer: Medicare Other | Source: Ambulatory Visit | Attending: Pain Medicine | Admitting: Pain Medicine

## 2021-02-26 DIAGNOSIS — R200 Anesthesia of skin: Secondary | ICD-10-CM | POA: Insufficient documentation

## 2021-02-26 DIAGNOSIS — G8929 Other chronic pain: Secondary | ICD-10-CM | POA: Insufficient documentation

## 2021-02-26 DIAGNOSIS — M25559 Pain in unspecified hip: Secondary | ICD-10-CM | POA: Diagnosis present

## 2021-03-03 ENCOUNTER — Ambulatory Visit: Payer: Medicare Other

## 2021-03-04 ENCOUNTER — Other Ambulatory Visit: Payer: Self-pay | Admitting: Pain Medicine

## 2021-03-04 DIAGNOSIS — G608 Other hereditary and idiopathic neuropathies: Secondary | ICD-10-CM | POA: Insufficient documentation

## 2021-03-04 DIAGNOSIS — R94131 Abnormal electromyogram [EMG]: Secondary | ICD-10-CM | POA: Insufficient documentation

## 2021-03-05 ENCOUNTER — Other Ambulatory Visit: Payer: Self-pay | Admitting: Pain Medicine

## 2021-03-05 DIAGNOSIS — R94131 Abnormal electromyogram [EMG]: Secondary | ICD-10-CM

## 2021-03-10 ENCOUNTER — Ambulatory Visit: Payer: Medicare Other | Admitting: Podiatry

## 2021-03-10 ENCOUNTER — Ambulatory Visit: Payer: Medicare Other

## 2021-03-12 ENCOUNTER — Ambulatory Visit: Payer: Medicare Other | Admitting: Podiatry

## 2021-03-18 ENCOUNTER — Ambulatory Visit: Payer: Medicare Other

## 2021-03-25 ENCOUNTER — Ambulatory Visit: Payer: Medicare Other

## 2021-03-31 ENCOUNTER — Ambulatory Visit: Payer: Medicare Other

## 2021-03-31 ENCOUNTER — Other Ambulatory Visit (HOSPITAL_COMMUNITY): Payer: Self-pay

## 2021-03-31 MED ORDER — GLUCOSE BLOOD VI STRP
ORAL_STRIP | 3 refills | Status: DC
  Filled 2021-10-14: qty 200, 90d supply, fill #0
  Filled 2021-12-22: qty 200, 90d supply, fill #1

## 2021-03-31 MED ORDER — ATORVASTATIN CALCIUM 40 MG PO TABS
ORAL_TABLET | ORAL | 1 refills | Status: DC
  Filled 2021-05-19: qty 90, 90d supply, fill #0

## 2021-03-31 MED ORDER — EMPAGLIFLOZIN 10 MG PO TABS
ORAL_TABLET | ORAL | 2 refills | Status: DC
  Filled 2021-04-08: qty 90, 90d supply, fill #0

## 2021-03-31 MED ORDER — OMEPRAZOLE 40 MG PO CPDR
DELAYED_RELEASE_CAPSULE | ORAL | 3 refills | Status: DC
  Filled 2021-07-06: qty 90, 90d supply, fill #0

## 2021-03-31 MED ORDER — METFORMIN HCL 850 MG PO TABS
ORAL_TABLET | ORAL | 3 refills | Status: DC
  Filled 2021-07-06: qty 180, 90d supply, fill #0

## 2021-03-31 MED ORDER — GLIPIZIDE ER 10 MG PO TB24
ORAL_TABLET | ORAL | 3 refills | Status: DC
  Filled 2021-04-08: qty 90, 90d supply, fill #0
  Filled 2021-07-24: qty 90, 90d supply, fill #1

## 2021-04-01 ENCOUNTER — Other Ambulatory Visit (HOSPITAL_COMMUNITY): Payer: Self-pay | Admitting: Neurological Surgery

## 2021-04-01 ENCOUNTER — Other Ambulatory Visit: Payer: Self-pay | Admitting: Neurological Surgery

## 2021-04-01 DIAGNOSIS — M48062 Spinal stenosis, lumbar region with neurogenic claudication: Secondary | ICD-10-CM

## 2021-04-02 ENCOUNTER — Other Ambulatory Visit: Payer: Self-pay

## 2021-04-02 ENCOUNTER — Ambulatory Visit (INDEPENDENT_AMBULATORY_CARE_PROVIDER_SITE_OTHER): Payer: HMO | Admitting: Podiatry

## 2021-04-02 ENCOUNTER — Encounter: Payer: Self-pay | Admitting: Podiatry

## 2021-04-02 DIAGNOSIS — B351 Tinea unguium: Secondary | ICD-10-CM | POA: Diagnosis not present

## 2021-04-02 DIAGNOSIS — D2371 Other benign neoplasm of skin of right lower limb, including hip: Secondary | ICD-10-CM

## 2021-04-02 DIAGNOSIS — E1165 Type 2 diabetes mellitus with hyperglycemia: Secondary | ICD-10-CM | POA: Diagnosis not present

## 2021-04-02 DIAGNOSIS — D2372 Other benign neoplasm of skin of left lower limb, including hip: Secondary | ICD-10-CM

## 2021-04-02 DIAGNOSIS — M79676 Pain in unspecified toe(s): Secondary | ICD-10-CM

## 2021-04-02 NOTE — Progress Notes (Signed)
He presents today chief complaint of painful corns and calluses as long as well as long painful toenails.  Objective: Vital signs are stable alert oriented x3 there is no erythema edema cellulitis drainage or odor.  Toenails are long thick yellow dystrophic-like mycotic painful palpation as well as debridement.  Also demonstrates reactive benign skin lesions plantar aspect no open lesions or wounds.  Assessment: In limb secondary to onychomycosis with diabetes.  History of diabetic ulceration benign skin lesions and digital deformities.  Plan: Debrided toenails and debrided all benign skin lesions.  Also he will follow-up with Howard Maxwell for diabetic shoe mobic.

## 2021-04-07 ENCOUNTER — Ambulatory Visit: Payer: Medicare Other

## 2021-04-08 ENCOUNTER — Other Ambulatory Visit (HOSPITAL_COMMUNITY): Payer: Self-pay

## 2021-04-08 MED FILL — Losartan Potassium Tab 100 MG: ORAL | 90 days supply | Qty: 90 | Fill #0 | Status: AC

## 2021-04-09 ENCOUNTER — Other Ambulatory Visit: Payer: Self-pay

## 2021-04-09 ENCOUNTER — Ambulatory Visit (HOSPITAL_COMMUNITY)
Admission: RE | Admit: 2021-04-09 | Discharge: 2021-04-09 | Disposition: A | Discharge status: Home / Self Care | Payer: HMO | Source: Ambulatory Visit | Attending: Neurological Surgery | Admitting: Neurological Surgery

## 2021-04-09 DIAGNOSIS — M48062 Spinal stenosis, lumbar region with neurogenic claudication: Secondary | ICD-10-CM | POA: Insufficient documentation

## 2021-04-09 DIAGNOSIS — G4733 Obstructive sleep apnea (adult) (pediatric): Secondary | ICD-10-CM | POA: Diagnosis not present

## 2021-04-09 DIAGNOSIS — M48061 Spinal stenosis, lumbar region without neurogenic claudication: Secondary | ICD-10-CM | POA: Diagnosis not present

## 2021-04-09 DIAGNOSIS — E119 Type 2 diabetes mellitus without complications: Secondary | ICD-10-CM | POA: Insufficient documentation

## 2021-04-09 DIAGNOSIS — M5416 Radiculopathy, lumbar region: Secondary | ICD-10-CM | POA: Diagnosis not present

## 2021-04-09 DIAGNOSIS — M4326 Fusion of spine, lumbar region: Secondary | ICD-10-CM | POA: Diagnosis not present

## 2021-04-09 DIAGNOSIS — M2578 Osteophyte, vertebrae: Secondary | ICD-10-CM | POA: Diagnosis not present

## 2021-04-09 LAB — GLUCOSE, CAPILLARY
Glucose-Capillary: 170 mg/dL — ABNORMAL HIGH (ref 70–99)
Glucose-Capillary: 169 mg/dL — ABNORMAL HIGH (ref 70–99)

## 2021-04-09 MED ORDER — LIDOCAINE HCL (PF) 1 % IJ SOLN
2.0000 mL | Freq: Once | INTRAMUSCULAR | Status: AC
  Administered 2021-04-09: 2 mL via INTRADERMAL

## 2021-04-09 MED ORDER — DIAZEPAM 5 MG PO TABS
10.0000 mg | ORAL_TABLET | Freq: Once | ORAL | Status: AC
  Administered 2021-04-09: 10 mg via ORAL
  Filled 2021-04-09: qty 2

## 2021-04-09 MED ORDER — IOHEXOL 350 MG/ML SOLN
25.0000 mL | Freq: Once | INTRAVENOUS | Status: AC | PRN
  Administered 2021-04-09: 25 mL via INTRATHECAL

## 2021-04-09 MED ORDER — ONDANSETRON HCL 4 MG/2ML IJ SOLN: 4.0000 mg | Freq: Four times a day (QID) | INTRAMUSCULAR | Status: DC | PRN

## 2021-04-09 MED ORDER — HYDROCODONE-ACETAMINOPHEN 5-325 MG PO TABS: 1.0000 | ORAL_TABLET | ORAL | Status: DC | PRN

## 2021-04-09 MED ORDER — IOHEXOL 180 MG/ML  SOLN
12.0000 mL | Freq: Once | INTRAMUSCULAR | Status: AC | PRN
Start: 2021-04-09 — End: 2021-04-09
  Administered 2021-04-09: 12 mL via INTRATHECAL

## 2021-04-09 NOTE — Procedures (Signed)
[name] is a 76 year old individual who underwent previous decompression and arthrodesis of the lumbar spine from L2-L5.  He has had some adjacent levels of stenosis that has been involving and these of been being monitored.  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  He is evaluated now myelographic [facility] as he cannot have an MRI because of the presence of some electronic hardware.  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  No change with flexion extension.  Further evaluation with CT scanning.

## 2021-04-09 NOTE — Progress Notes (Signed)
Pt ambulated without difficulty or bleeding.   Discharged home with wife who will drive and stay with pt x 24 hrs 

## 2021-04-11 DIAGNOSIS — M48062 Spinal stenosis, lumbar region with neurogenic claudication: Secondary | ICD-10-CM | POA: Diagnosis not present

## 2021-04-14 ENCOUNTER — Ambulatory Visit: Payer: Medicare Other

## 2021-04-18 DIAGNOSIS — G4733 Obstructive sleep apnea (adult) (pediatric): Secondary | ICD-10-CM | POA: Diagnosis not present

## 2021-04-18 DIAGNOSIS — E669 Obesity, unspecified: Secondary | ICD-10-CM | POA: Diagnosis not present

## 2021-04-18 DIAGNOSIS — I10 Essential (primary) hypertension: Secondary | ICD-10-CM | POA: Diagnosis not present

## 2021-04-18 DIAGNOSIS — Z7984 Long term (current) use of oral hypoglycemic drugs: Secondary | ICD-10-CM | POA: Diagnosis not present

## 2021-04-18 DIAGNOSIS — I2510 Atherosclerotic heart disease of native coronary artery without angina pectoris: Secondary | ICD-10-CM | POA: Diagnosis not present

## 2021-04-18 DIAGNOSIS — E119 Type 2 diabetes mellitus without complications: Secondary | ICD-10-CM | POA: Diagnosis not present

## 2021-04-18 DIAGNOSIS — G4734 Idiopathic sleep related nonobstructive alveolar hypoventilation: Secondary | ICD-10-CM | POA: Diagnosis not present

## 2021-04-21 ENCOUNTER — Ambulatory Visit: Payer: Medicare Other

## 2021-04-21 ENCOUNTER — Ambulatory Visit: Payer: HMO

## 2021-04-21 ENCOUNTER — Other Ambulatory Visit: Payer: Self-pay

## 2021-04-21 DIAGNOSIS — L97521 Non-pressure chronic ulcer of other part of left foot limited to breakdown of skin: Secondary | ICD-10-CM

## 2021-04-21 DIAGNOSIS — E1165 Type 2 diabetes mellitus with hyperglycemia: Secondary | ICD-10-CM

## 2021-04-21 NOTE — Progress Notes (Signed)
SITUATION Reason for Consult: Evaluation for Prefabricated Diabetic Shoes and Bilateral Custom Diabetic Inserts. Patient / Caregiver Report: Patient would like well fitting shoes  OBJECTIVE DATA: Patient History / Diagnosis:    ICD-10-CM   1. Type 2 diabetes mellitus with hyperglycemia, without long-term current use of insulin ([facility])  E11.65     2. Ulcer of left foot, limited to breakdown of skin ([place])  L97.521       Current or Previous Devices:   Historical diabetic shoe user  In-Person Foot Examination: Ulcers & Callousing:   Left mid-met heads callus  Toe / Foot Deformities:   - Pes planus   Shoe Size: 10.5W  ORTHOTIC RECOMMENDATION Recommended Devices: - 1x pair prefabricated PDAC approved diabetic shoes: [id number] 184 10.5W - 3x pair custom-to-patient vacuum formed diabetic insoles.   GOALS OF SHOES AND INSOLES - Reduce shear and pressure - Reduce / Prevent callus formation - Reduce / Prevent ulceration - Protect the fragile healing compromised diabetic foot.  Patient would benefit from diabetic shoes and inserts as patient has diabetes mellitus and the patient has one or more of the following conditions: - History of previous foot ulceration. - History of pre-ulcerative callus - Peripheral neuropathy with evidence of callus formation - Foot deformity - Poor circulation  ACTIONS PERFORMED Patient was casted for insoles via crush box and measured for shoes via brannock device. Procedure was explained and patient tolerated procedure well. All questions were answered and concerns addressed.  PLAN Patient is to ensure treating physician receives and completes diabetic paperwork. Casts and shoe order are to be held until paperwork is received. Once received patient is to be scheduled for fitting in four weeks.

## 2021-05-18 ENCOUNTER — Other Ambulatory Visit: Payer: Self-pay | Admitting: Cardiovascular Disease

## 2021-05-19 ENCOUNTER — Other Ambulatory Visit (HOSPITAL_COMMUNITY): Payer: Self-pay

## 2021-05-22 DIAGNOSIS — D3131 Benign neoplasm of right choroid: Secondary | ICD-10-CM | POA: Diagnosis not present

## 2021-05-24 NOTE — Progress Notes (Signed)
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 ON 06/21/2021] morphine, [START ON 07/21/2021] morphine, [START ON 08/20/2021] morphine, omeprazole, spironolactone, tadalafil, and turmeric.  Pharmacotherapy (Medications Ordered): Meds ordered this encounter  Medications   morphine (MS CONTIN) 15 MG 12 hr tablet    Sig: Take 1 tablet (15 mg total) by mouth every 12 (twelve) hours. Must last 30 days. Do not break tablet    Dispense:  60 tablet    Refill:  0    DO NOT: delete (not duplicate); no partial-fill (will deny script to complete), no refill request (F/U required). DISPENSE: 1 day early if closed on fill date. WARN: No CNS-depressants within 8 hrs of med.   morphine (MS CONTIN) 15 MG 12 hr tablet    Sig: Take 1 tablet (15 mg total) by mouth every 12 (twelve) hours. Must last 30 days. Do not break tablet    Dispense:  60 tablet    Refill:  0    DO NOT: delete (not duplicate); no partial-fill (will deny script  to complete), no refill request (F/U required). DISPENSE: 1 day early if closed on fill date. WARN: No CNS-depressants within 8 hrs of med.   morphine (MS CONTIN) 15 MG 12 hr tablet    Sig: Take 1 tablet (15 mg total) by mouth every 12 (twelve) hours. Must last 30 days. Do not break tablet    Dispense:  60 tablet    Refill:  0    DO NOT: delete (not duplicate); no partial-fill (will deny script to complete), no refill request (F/U required). DISPENSE: 1 day early if closed on fill date. WARN: No CNS-depressants within 8 hrs of med.   Orders:  Orders Placed This Encounter  Procedures   KNEE INJECTION    Indications: Knee arthralgia (pain) due to osteoarthritis (OA) Imaging: None ([id number]) Nursing: Please request pharmacy to have Hyalgan (Hyaluronan/Hyaluronic acid) available in the office.    Standing Status:   Future    Standing Expiration Date:   08/26/2021    Scheduling Instructions:     Procedure: Knee injection Hyalgan (Hyaluronan/Hyaluronic acid)     Treatment No.: 1     Level: Intra-articular     Laterality: Left Knee     Sedation: Patient's choice.     Timeframe: As soon as schedule allows    Order Specific Question:   Where will this procedure be performed?    Answer:   [facility]   Follow-up plan:   Return for (Clinic) procedure: (L) Hyalgan Knee inj. #1.     Interventional Therapies  Risk   Complexity Considerations:   Estimated body mass index is 32.28 kg/m as calculated from the following:   Height as of this encounter: _0  (1.778 m).   Weight as of this encounter: 225 lb (102.1 kg). WNL   Planned   Pending:   Therapeutic Left Hyalgan Knee inj. #1(S2)    Under consideration:   Diagnostic bilateral lumbar facet MBB  Diagnostic L3-4 interspinous ligament block  Possible L3-4 bilateral medial branch RFA  Diagnostic right L2-3 and/or L3-4 LESI  Diagnostic bilateral L1 & L4 TFESI  Diagnostic  bilateral L2 TFESI  Diagnostic caudal ESI + Dx epidurogram   Diagnostic bilateral femoral and obturator NB  Diagnostic bilateral cervical facet MBB  Diagnostic left Genicular NB    Completed:   Palliative left Hyalgan knee injection x4 (01/28/2017)  Therapeutic bilateral L2 TFESI x1 (10/22/2016)  Therapeutic right L3-4 LESI x1 (12/01/2016)  Therapeutic/palliative right IA hip inj. x1 (09/07/2019) (100/100/90/90)  Therapeutic left SSNB x1 (11/30/2019)  Diagnostic/therapeutic left CESI x1 (02/06/2020)    Therapeutic   Palliative (PRN) options:   Palliative left Knee Hyalgan injection  Therapeutic bilateral L2 TFESI #2  Therapeutic right L3-4 LESI #2  Palliative right IA hip joint injection #2  Diagnostic left suprascapular NB #2  Diagnostic/therapeutic left CESI #2     Recent Visits No visits were found meeting these conditions. Showing recent visits within past 90 days and meeting all other requirements Today's Visits Date Type Provider Dept  05/26/21 Office Visit Milinda Pointer, MD [facility]  Showing today's visits and meeting all other requirements Future Appointments No visits were found meeting these conditions. Showing future appointments within next 90 days and meeting all other requirements  I discussed the assessment and treatment plan with the patient. The patient was provided an opportunity to ask questions and all were answered. The patient agreed with the plan and demonstrated an understanding of the instructions.  Patient advised to call back or seek an in-person evaluation if the symptoms or condition worsens.  Duration of encounter: 30 minutes.  Note by: Gaspar Cola, MD Date: 05/26/2021; Time: 9:36 AM

## 2021-05-26 ENCOUNTER — Ambulatory Visit: Payer: HMO | Attending: Pain Medicine | Admitting: Pain Medicine

## 2021-05-26 ENCOUNTER — Encounter: Payer: Self-pay | Admitting: Pain Medicine

## 2021-05-26 ENCOUNTER — Other Ambulatory Visit: Payer: Self-pay

## 2021-05-26 VITALS — BP 140/77 | HR 60 | Temp 97.2°F | Resp 16 | Ht 70.0 in | Wt 230.0 lb

## 2021-05-26 DIAGNOSIS — M25559 Pain in unspecified hip: Secondary | ICD-10-CM | POA: Diagnosis not present

## 2021-05-26 DIAGNOSIS — M545 Low back pain, unspecified: Secondary | ICD-10-CM | POA: Insufficient documentation

## 2021-05-26 DIAGNOSIS — M1712 Unilateral primary osteoarthritis, left knee: Secondary | ICD-10-CM

## 2021-05-26 DIAGNOSIS — M79602 Pain in left arm: Secondary | ICD-10-CM | POA: Diagnosis not present

## 2021-05-26 DIAGNOSIS — R94131 Abnormal electromyogram [EMG]: Secondary | ICD-10-CM | POA: Diagnosis not present

## 2021-05-26 DIAGNOSIS — M25562 Pain in left knee: Secondary | ICD-10-CM

## 2021-05-26 DIAGNOSIS — I2510 Atherosclerotic heart disease of native coronary artery without angina pectoris: Secondary | ICD-10-CM | POA: Diagnosis not present

## 2021-05-26 DIAGNOSIS — M5136 Other intervertebral disc degeneration, lumbar region: Secondary | ICD-10-CM | POA: Diagnosis not present

## 2021-05-26 DIAGNOSIS — R400 Somnolence: Secondary | ICD-10-CM | POA: Insufficient documentation

## 2021-05-26 DIAGNOSIS — M542 Cervicalgia: Secondary | ICD-10-CM

## 2021-05-26 DIAGNOSIS — F5104 Psychophysiologic insomnia: Secondary | ICD-10-CM | POA: Insufficient documentation

## 2021-05-26 DIAGNOSIS — Z87891 Personal history of nicotine dependence: Secondary | ICD-10-CM | POA: Diagnosis not present

## 2021-05-26 DIAGNOSIS — Z6832 Body mass index (BMI) 32.0-32.9, adult: Secondary | ICD-10-CM | POA: Diagnosis not present

## 2021-05-26 DIAGNOSIS — M25551 Pain in right hip: Secondary | ICD-10-CM | POA: Diagnosis not present

## 2021-05-26 DIAGNOSIS — G894 Chronic pain syndrome: Secondary | ICD-10-CM

## 2021-05-26 DIAGNOSIS — Z79891 Long term (current) use of opiate analgesic: Secondary | ICD-10-CM | POA: Diagnosis not present

## 2021-05-26 DIAGNOSIS — M961 Postlaminectomy syndrome, not elsewhere classified: Secondary | ICD-10-CM | POA: Diagnosis not present

## 2021-05-26 DIAGNOSIS — M79601 Pain in right arm: Secondary | ICD-10-CM | POA: Insufficient documentation

## 2021-05-26 DIAGNOSIS — Z970 Presence of artificial eye: Secondary | ICD-10-CM | POA: Diagnosis not present

## 2021-05-26 DIAGNOSIS — R937 Abnormal findings on diagnostic imaging of other parts of musculoskeletal system: Secondary | ICD-10-CM

## 2021-05-26 DIAGNOSIS — G8929 Other chronic pain: Secondary | ICD-10-CM | POA: Diagnosis not present

## 2021-05-26 DIAGNOSIS — E1165 Type 2 diabetes mellitus with hyperglycemia: Secondary | ICD-10-CM | POA: Diagnosis not present

## 2021-05-26 DIAGNOSIS — M1990 Unspecified osteoarthritis, unspecified site: Secondary | ICD-10-CM | POA: Diagnosis not present

## 2021-05-26 DIAGNOSIS — I10 Essential (primary) hypertension: Secondary | ICD-10-CM | POA: Diagnosis not present

## 2021-05-26 DIAGNOSIS — M5414 Radiculopathy, thoracic region: Secondary | ICD-10-CM

## 2021-05-26 DIAGNOSIS — Z79899 Other long term (current) drug therapy: Secondary | ICD-10-CM | POA: Diagnosis not present

## 2021-05-26 DIAGNOSIS — G4734 Idiopathic sleep related nonobstructive alveolar hypoventilation: Secondary | ICD-10-CM | POA: Insufficient documentation

## 2021-05-26 DIAGNOSIS — G4700 Insomnia, unspecified: Secondary | ICD-10-CM | POA: Insufficient documentation

## 2021-05-26 MED ORDER — MORPHINE SULFATE ER 15 MG PO TBCR: 15.0000 mg | EXTENDED_RELEASE_TABLET | Freq: Two times a day (BID) | ORAL | 0 refills | Status: DC

## 2021-05-26 NOTE — Patient Instructions (Signed)
______________________________________________________________________  Preparing for your procedure (without sedation)  Procedure appointments are limited to planned procedures: No Prescription Refills. No disability issues will be discussed. No medication changes will be discussed.  Instructions: Oral Intake: Do not eat or drink anything for at least 6 hours prior to your procedure. (Exception: Blood Pressure Medication. See below.) Transportation: Unless otherwise stated by your physician, you may drive yourself after the procedure. Blood Pressure Medicine: Do not forget to take your blood pressure medicine with a sip of water the morning of the procedure. If your Diastolic (lower reading)is above 100 mmHg, elective cases will be cancelled/rescheduled. Blood thinners: These will need to be stopped for procedures. Notify our staff if you are taking any blood thinners. Depending on which one you take, there will be specific instructions on how and when to stop it. Diabetics on insulin: Notify the staff so that you can be scheduled 1st case in the morning. If your diabetes requires high dose insulin, take only  of your normal insulin dose the morning of the procedure and notify the staff that you have done so. Preventing infections: Shower with an antibacterial soap the morning of your procedure.  Build-up your immune system: Take 1000 mg of Vitamin C with every meal (3 times a day) the day prior to your procedure. Antibiotics: Inform the staff if you have a condition or reason that requires you to take antibiotics before dental procedures. Pregnancy: If you are pregnant, call and cancel the procedure. Sickness: If you have a cold, fever, or any active infections, call and cancel the procedure. Arrival: You must be in the facility at least 30 minutes prior to your scheduled procedure. Children: Do not bring any children with you. Dress appropriately: Bring dark clothing that you would not mind  if they get stained. Valuables: Do not bring any jewelry or valuables.  Reasons to call and reschedule or cancel your procedure: (Following these recommendations will minimize the risk of a serious complication.) Surgeries: Avoid having procedures within 2 weeks of any surgery. (Avoid for 2 weeks before or after any surgery). Flu Shots: Avoid having procedures within 2 weeks of a flu shots or . (Avoid for 2 weeks before or after immunizations). Barium: Avoid having a procedure within 7-10 days after having had a radiological study involving the use of radiological contrast. (Myelograms, Barium swallow or enema study). Heart attacks: Avoid any elective procedures or surgeries for the initial 6 months after a "Myocardial Infarction" (Heart Attack). Blood thinners: It is imperative that you stop these medications before procedures. Let us know if you if you take any blood thinner.  Infection: Avoid procedures during or within two weeks of an infection (including chest colds or gastrointestinal problems). Symptoms associated with infections include: Localized redness, fever, chills, night sweats or profuse sweating, burning sensation when voiding, cough, congestion, stuffiness, runny nose, sore throat, diarrhea, nausea, vomiting, cold or Flu symptoms, recent or current infections. It is specially important if the infection is over the area that we intend to treat. Heart and lung problems: Symptoms that may suggest an active cardiopulmonary problem include: cough, chest pain, breathing difficulties or shortness of breath, dizziness, ankle swelling, uncontrolled high or unusually low blood pressure, and/or palpitations. If you are experiencing any of these symptoms, cancel your procedure and contact your primary care physician for an evaluation.  Remember:  Regular Business hours are:  Monday to Thursday 8:00 AM to 4:00 PM  Provider's Schedule: Milinda Pointer, MD:  Procedure days: Tuesday and Thursday  7:30 AM to 4:00 PM  Gillis Santa, MD:  Procedure days: Monday and Wednesday 7:30 AM to 4:00 PM ______________________________________________________________________  ____________________________________________________________________________________________  General Risks and Possible Complications  Patient Responsibilities: It is important that you read this as it is part of your informed consent. It is our duty to inform you of the risks and possible complications associated with treatments offered to you. It is your responsibility as a patient to read this and to ask questions about anything that is not clear or that you believe was not covered in this document.  Patients Rights: You have the right to refuse treatment. You also have the right to change your mind, even after initially having agreed to have the treatment done. However, under this last option, if you wait until the last second to change your mind, you may be charged for the materials used up to that point.  Introduction: Medicine is not an Chief Strategy Officer. Everything in Medicine, including the lack of treatment(s), carries the potential for danger, harm, or loss (which is by definition: Risk). In Medicine, a complication is a secondary problem, condition, or disease that can aggravate an already existing one. All treatments carry the risk of possible complications. The fact that a side effects or complications occurs, does not imply that the treatment was conducted incorrectly. It must be clearly understood that these can happen even when everything is done following the highest safety standards.  No treatment: You can choose not to proceed with the proposed treatment alternative. The PRO(s) would include: avoiding the risk of complications associated with the therapy. The CON(s) would include: not getting any of the treatment benefits. These benefits fall under one of three categories: diagnostic; therapeutic; and/or palliative.  Diagnostic benefits include: getting information which can ultimately lead to improvement of the disease or symptom(s). Therapeutic benefits are those associated with the successful treatment of the disease. Finally, palliative benefits are those related to the decrease of the primary symptoms, without necessarily curing the condition (example: decreasing the pain from a flare-up of a chronic condition, such as incurable terminal cancer).  General Risks and Complications: These are associated to most interventional treatments. They can occur alone, or in combination. They fall under one of the following six (6) categories: no benefit or worsening of symptoms; bleeding; infection; nerve damage; allergic reactions; and/or death. No benefits or worsening of symptoms: In Medicine there are no guarantees, only probabilities. No healthcare provider can ever guarantee that a medical treatment will work, they can only state the probability that it may. Furthermore, there is always the possibility that the condition may worsen, either directly, or indirectly, as a consequence of the treatment. Bleeding: This is more common if the patient is taking a blood thinner, either prescription or over the counter (example: Goody Powders, Fish oil, Aspirin, Garlic, etc.), or if suffering a condition associated with impaired coagulation (example: Hemophilia, cirrhosis of the liver, low platelet counts, etc.). However, even if you do not have one on these, it can still happen. If you have any of these conditions, or take one of these drugs, make sure to notify your treating physician. Infection: This is more common in patients with a compromised immune system, either due to disease (example: diabetes, cancer, human immunodeficiency virus [HIV], etc.), or due to medications or treatments (example: therapies used to treat cancer and rheumatological diseases). However, even if you do not have one on these, it can still happen. If you  have any of these conditions, or take  one of these drugs, make sure to notify your treating physician. Nerve Damage: This is more common when the treatment is an invasive one, but it can also happen with the use of medications, such as those used in the treatment of cancer. The damage can occur to small secondary nerves, or to large primary ones, such as those in the spinal cord and brain. This damage may be temporary or permanent and it may lead to impairments that can range from temporary numbness to permanent paralysis and/or brain death. Allergic Reactions: Any time a substance or material comes in contact with our body, there is the possibility of an allergic reaction. These can range from a mild skin rash (contact dermatitis) to a severe systemic reaction (anaphylactic reaction), which can result in death. Death: In general, any medical intervention can result in death, most of the time due to an unforeseen complication. ____________________________________________________________________________________________ ____________________________________________________________________________________________  Medication Rules  Purpose: To inform patients, and their family members, of our rules and regulations.  Applies to: All patients receiving prescriptions (written or electronic).  Pharmacy of record: Pharmacy where electronic prescriptions will be sent. If written prescriptions are taken to a different pharmacy, please inform the nursing staff. The pharmacy listed in the electronic medical record should be the one where you would like electronic prescriptions to be sent.  Electronic prescriptions: In compliance with the [place] (STOP) Act of 2017 (Session Lanny Cramp [phone]), effective March 23, 2018, all controlled substances must be electronically prescribed. Calling prescriptions to the pharmacy will cease to exist.  Prescription refills: Only during  scheduled appointments. Applies to all prescriptions.  NOTE: The following applies primarily to controlled substances (Opioid* Pain Medications).   Type of encounter (visit): For patients receiving controlled substances, face-to-face visits are required. (Not an option or up to the patient.)  Patient's responsibilities: Pain Pills: Bring all pain pills to every appointment (except for procedure appointments). Pill Bottles: Bring pills in original pharmacy bottle. Always bring the newest bottle. Bring bottle, even if empty. Medication refills: You are responsible for knowing and keeping track of what medications you take and those you need refilled. The day before your appointment: write a list of all prescriptions that need to be refilled. The day of the appointment: give the list to the admitting nurse. Prescriptions will be written only during appointments. No prescriptions will be written on procedure days. If you forget a medication: it will not be "Called in", "Faxed", or "electronically sent". You will need to get another appointment to get these prescribed. No early refills. Do not call asking to have your prescription filled early. Prescription Accuracy: You are responsible for carefully inspecting your prescriptions before leaving our office. Have the discharge nurse carefully go over each prescription with you, before taking them home. Make sure that your name is accurately spelled, that your address is correct. Check the name and dose of your medication to make sure it is accurate. Check the number of pills, and the written instructions to make sure they are clear and accurate. Make sure that you are given enough medication to last until your next medication refill appointment. Taking Medication: Take medication as prescribed. When it comes to controlled substances, taking less pills or less frequently than prescribed is permitted and encouraged. Never take more pills than  instructed. Never take medication more frequently than prescribed.  Inform other Doctors: Always inform, all of your healthcare providers, of all the medications you take. Pain Medication from other Providers:  You are not allowed to accept any additional pain medication from any other Doctor or Healthcare provider. There are two exceptions to this rule. (see below) In the event that you require additional pain medication, you are responsible for notifying us, as stated below. Cough Medicine: Often these contain an opioid, such as codeine or hydrocodone. Never accept or take cough medicine containing these opioids if you are already taking an opioid* medication. The combination may cause respiratory failure and death. Medication Agreement: You are responsible for carefully reading and following our Medication Agreement. This must be signed before receiving any prescriptions from our practice. Safely store a copy of your signed Agreement. Violations to the Agreement will result in no further prescriptions. (Additional copies of our Medication Agreement are available upon request.) Laws, Rules, & Regulations: All patients are expected to follow all Federal and [employer], [employer], Rules, [employer] [facility]. Ignorance of the Laws does not constitute a valid excuse.  Illegal drugs and Controlled Substances: The use of illegal substances (including, but not limited to marijuana and its derivatives) and/or the illegal use of any controlled substances is strictly prohibited. Violation of this rule may result in the immediate and permanent discontinuation of any and all prescriptions being written by our practice. The use of any illegal substances is prohibited. Adopted CDC guidelines & recommendations: Target dosing levels will be at or below 60 MME/day. Use of benzodiazepines** is not recommended.  Exceptions: There are only two exceptions to the rule of not receiving pain medications from other Healthcare  Providers. Exception #1 (Emergencies): In the event of an emergency (i.e.: accident requiring emergency care), you are allowed to receive additional pain medication. However, you are responsible for: As soon as you are able, call our office [phone], at any time of the day or night, and leave a message stating your name, the date and nature of the emergency, and the name and dose of the medication prescribed. In the event that your call is answered by a member of our staff, make sure to document and save the date, time, and the name of the person that took your information.  Exception #2 (Planned Surgery): In the event that you are scheduled by another doctor or dentist to have any type of surgery or procedure, you are allowed (for a period no longer than 30 days), to receive additional pain medication, for the acute post-op pain. However, in this case, you are responsible for picking up a copy of our "Post-op Pain Management for Surgeons" handout, and giving it to your surgeon or dentist. This document is available at our office, and does not require an appointment to obtain it. Simply go to our office during business hours (Monday-Thursday from 8:00 AM to 4:00 PM) (Friday 8:00 AM to 12:00 Noon) or if you have a scheduled appointment with [place], prior to your surgery, and ask for it by name. In addition, you are responsible for: calling our office [phone], at any time of the day or night, and leaving a message stating your name, name of your surgeon, type of surgery, and date of procedure or surgery. Failure to comply with your responsibilities may result in termination of therapy involving the controlled substances. Medication Agreement Violation. Following the above rules, including your responsibilities will help you in avoiding a Medication Agreement Violation (Breaking your Pain Medication Contract).  *Opioid medications include: morphine, codeine, oxycodone, oxymorphone, hydrocodone,  hydromorphone, meperidine, tramadol, tapentadol, buprenorphine, fentanyl, methadone. **Benzodiazepine medications include: diazepam (Valium), alprazolam (Xanax), clonazepam (  Klonopine), lorazepam (Ativan), clorazepate (Tranxene), chlordiazepoxide (Librium), estazolam (Prosom), oxazepam (Serax), temazepam (Restoril), triazolam (Halcion) (Last updated: 12/18/2020) ____________________________________________________________________________________________  ____________________________________________________________________________________________  Medication Recommendations and Reminders  Applies to: All patients receiving prescriptions (written and/or electronic).  Medication Rules & Regulations: These rules and regulations exist for your safety and that of others. They are not flexible and neither are we. Dismissing or ignoring them will be considered "non-compliance" with medication therapy, resulting in complete and irreversible termination of such therapy. (See document titled "Medication Rules" for more details.) In all conscience, because of safety reasons, we cannot continue providing a therapy where the patient does not follow instructions.  Pharmacy of record:  Definition: This is the pharmacy where your electronic prescriptions will be sent.  We do not endorse any particular pharmacy, however, we have experienced problems with [facility] not securing enough medication supply for the community. We do not restrict you in your choice of pharmacy. However, once we write for your prescriptions, we will NOT be re-sending more prescriptions to fix restricted supply problems created by your pharmacy, or your insurance.  The pharmacy listed in the electronic medical record should be the one where you want electronic prescriptions to be sent. If you choose to change pharmacy, simply notify our nursing staff.  Recommendations: Keep all of your pain medications in a safe place, under lock and key,  even if you live alone. We will NOT replace lost, stolen, or damaged medication. After you fill your prescription, take 1 week's worth of pills and put them away in a safe place. You should keep a separate, properly labeled bottle for this purpose. The remainder should be kept in the original bottle. Use this as your primary supply, until it runs out. Once it's gone, then you know that you have 1 week's worth of medicine, and it is time to come in for a prescription refill. If you do this correctly, it is unlikely that you will ever run out of medicine. To make sure that the above recommendation works, it is very important that you make sure your medication refill appointments are scheduled at least 1 week before you run out of medicine. To do this in an effective manner, make sure that you do not leave the office without scheduling your next medication management appointment. Always ask the nursing staff to show you in your prescription , when your medication will be running out. Then arrange for the receptionist to get you a return appointment, at least 7 days before you run out of medicine. Do not wait until you have 1 or 2 pills left, to come in. This is very poor planning and does not take into consideration that we may need to cancel appointments due to bad weather, sickness, or emergencies affecting our staff. DO NOT ACCEPT A "Partial Fill": If for any reason your pharmacy does not have enough pills/tablets to completely fill or refill your prescription, do not allow for a "partial fill". The law allows the pharmacy to complete that prescription within 72 hours, without requiring a new prescription. If they do not fill the rest of your prescription within those 72 hours, you will need a separate prescription to fill the remaining amount, which we will NOT provide. If the reason for the partial fill is your insurance, you will need to talk to the pharmacist about payment alternatives for the remaining  tablets, but again, DO NOT ACCEPT A PARTIAL FILL, unless you can trust your pharmacist to obtain the remainder of the pills  within 72 hours.  Prescription refills and/or changes in medication(s):  Prescription refills, and/or changes in dose or medication, will be conducted only during scheduled medication management appointments. (Applies to both, written and electronic prescriptions.) No refills on procedure days. No medication will be changed or started on procedure days. No changes, adjustments, and/or refills will be conducted on a procedure day. Doing so will interfere with the diagnostic portion of the procedure. No phone refills. No medications will be "called into the pharmacy". No Fax refills. No weekend refills. No Holliday refills. No after hours refills.  Remember:  Business hours are:  Monday to Thursday 8:00 AM to 4:00 PM Provider's Schedule: Milinda Pointer, MD - Appointments are:  Medication management: Monday and Wednesday 8:00 AM to 4:00 PM Procedure day: Tuesday and Thursday 7:30 AM to 4:00 PM Gillis Santa, MD - Appointments are:  Medication management: Tuesday and Thursday 8:00 AM to 4:00 PM Procedure day: Monday and Wednesday 7:30 AM to 4:00 PM (Last update: 10/11/2019) ____________________________________________________________________________________________  ____________________________________________________________________________________________  CBD (cannabidiol) & Delta-8 (Delta-8 tetrahydrocannabinol) WARNING  Intro: Cannabidiol (CBD) and tetrahydrocannabinol (THC), are two natural compounds found in plants of the Cannabis genus. They can both be extracted from hemp or cannabis. Hemp and cannabis come from the Cannabis sativa plant. Both compounds interact with your bodys endocannabinoid system, but they have very different effects. CBD does not produce the high sensation associated with cannabis. Delta-8 tetrahydrocannabinol, also known as delta-8 THC,  is a psychoactive substance found in the Cannabis sativa plant, of which marijuana and hemp are two varieties. THC is responsible for the high associated with the illicit use of marijuana.  Applicable to: All individuals currently taking or considering taking CBD (cannabidiol) and, more important, all patients taking opioid analgesic controlled substances (pain medication). (Example: oxycodone; oxymorphone; hydrocodone; hydromorphone; morphine; methadone; tramadol; tapentadol; fentanyl; buprenorphine; butorphanol; dextromethorphan; meperidine; codeine; etc.)  Legal status: CBD remains a Schedule I drug prohibited for any use. CBD is illegal with one exception. In the [place], CBD has a limited Transport planner (FDA) approval for the treatment of two specific types of epilepsy disorders. Only one CBD product has been approved by the FDA for this purpose: "Epidiolex". FDA is aware that some companies are marketing products containing cannabis and cannabis-derived compounds in ways that violate the [employer], Drug and Cosmetic Act [facility] Act) and that may put the health and safety of consumers at risk. The FDA, a Federal agency, has not enforced the CBD status since 2018. UPDATE: (05/09/2021) The Drug Enforcement Agency ([place]) issued a letter stating that "delta" cannabinoids, including Delta-8-THCO and Delta-9-THCO, synthetically derived from hemp do not qualify as hemp and will be viewed as Schedule I drugs. (Schedule I drugs, substances, or chemicals are defined as drugs with no currently accepted medical use and a high potential for abuse. Some examples of Schedule I drugs are: heroin, lysergic acid diethylamide (LSD), marijuana (cannabis), 3,4-methylenedioxymethamphetamine (ecstasy), methaqualone, and peyote.) (https://jennings.com/)  Legality: Some manufacturers ship CBD products nationally, which is illegal. Often such products are sold online and are therefore available throughout the  country. CBD is openly sold in head shops and health food stores in some states where such sales have not been explicitly legalized. Selling unapproved products with unsubstantiated therapeutic claims is not only a violation of the law, but also can put patients at risk, as these products have not been proven to be safe or effective. Federal illegality makes it difficult to conduct research on CBD.  Reference: "  FDA Regulation of Cannabis and Cannabis-Derived Products, Including Cannabidiol (CBD)" - [employer].pt  Warning: CBD is not FDA approved and has not undergo the same manufacturing controls as prescription drugs.  This means that the purity and safety of available CBD may be questionable. Most of the time, despite manufacturer's claims, it is contaminated with THC (delta-9-tetrahydrocannabinol - the chemical in marijuana responsible for the "HIGH").  When this is the case, the [facility] contaminant will trigger a positive urine drug screen (UDS) test for Marijuana (carboxy-THC). Because a positive UDS for any illicit substance is a violation of our medication agreement, your opioid analgesics (pain medicine) may be permanently discontinued. The FDA recently put out a warning about 5 things that everyone should be aware of regarding Delta-8 THC: Delta-8 THC products have not been evaluated or approved by the FDA for safe use and may be marketed in ways that put the public health at risk. The FDA has received adverse event reports involving delta-8 THC-containing products. Delta-8 THC has psychoactive and intoxicating effects. Delta-8 THC manufacturing often involve use of potentially harmful chemicals to create the concentrations of delta-8 THC claimed in the marketplace. The final delta-8 THC product may have potentially harmful by-products (contaminants) due to the chemicals used in the  process. Manufacturing of delta-8 THC products may occur in uncontrolled or unsanitary settings, which may lead to the presence of unsafe contaminants or other potentially harmful substances. Delta-8 THC products should be kept out of the reach of children and pets.  MORE ABOUT CBD  General Information: CBD was discovered in [birth year] and it is a derivative of the cannabis sativa genus plants (Marijuana and Hemp). It is one of the 113 identified substances found in Marijuana. It accounts for up to 40% of the plant's extract. As of 2018, preliminary clinical studies on CBD included research for the treatment of anxiety, movement disorders, and pain. CBD is available and consumed in multiple forms, including inhalation of smoke or vapor, as an aerosol spray, and by mouth. It may be supplied as an oil containing CBD, capsules, dried cannabis, or as a liquid solution. CBD is thought not to be as psychoactive as THC (delta-9-tetrahydrocannabinol - the chemical in marijuana responsible for the "HIGH"). Studies suggest that CBD may interact with different biological target receptors in the body, including cannabinoid and other neurotransmitter receptors. As of 2018 the mechanism of action for its biological effects has not been determined.  Side-effects   Adverse reactions: Dry mouth, diarrhea, decreased appetite, fatigue, drowsiness, malaise, weakness, sleep disturbances, and others.  Drug interactions: CBC may interact with other medications such as blood-thinners. Because CBD causes drowsiness on its own, it also increases the drowsiness caused by other medications, including antihistamines (such as Benadryl), benzodiazepines (Xanax, Ativan, Valium), antipsychotics, antidepressants and opioids, as well as alcohol and supplements such as kava, melatonin and St. John's Wort. Be cautious with the following combinations:   Brivaracetam (Briviact) Brivaracetam is changed and broken down by the body. CBD might decrease  how quickly the body breaks down brivaracetam. This might increase levels of brivaracetam in the body.  Caffeine Caffeine is changed and broken down by the body. CBD might decrease how quickly the body breaks down caffeine. This might increase levels of caffeine in the body.  Carbamazepine (Tegretol) Carbamazepine is changed and broken down by the body. CBD might decrease how quickly the body breaks down carbamazepine. This might increase levels of carbamazepine in the body and increase its side effects.  Citalopram (Celexa) Citalopram is  changed and broken down by the body. CBD might decrease how quickly the body breaks down citalopram. This might increase levels of citalopram in the body and increase its side effects.  Clobazam (Onfi) Clobazam is changed and broken down by the liver. CBD might decrease how quickly the liver breaks down clobazam. This might increase the effects and side effects of clobazam.  Eslicarbazepine (Aptiom) Eslicarbazepine is changed and broken down by the body. CBD might decrease how quickly the body breaks down eslicarbazepine. This might increase levels of eslicarbazepine in the body by a small amount.  Everolimus (Zostress) Everolimus is changed and broken down by the body. CBD might decrease how quickly the body breaks down everolimus. This might increase levels of everolimus in the body.  Lithium Taking higher doses of CBD might increase levels of lithium. This can increase the risk of lithium toxicity.  Medications changed by the liver (Cytochrome P450 1A1 (CYP1A1) substrates) Some medications are changed and broken down by the liver. CBD might change how quickly the liver breaks down these medications. This could change the effects and side effects of these medications.  Medications changed by the liver (Cytochrome P450 1A2 (CYP1A2) substrates) Some medications are changed and broken down by the liver. CBD might change how quickly the liver breaks down  these medications. This could change the effects and side effects of these medications.  Medications changed by the liver (Cytochrome P450 1B1 (CYP1B1) substrates) Some medications are changed and broken down by the liver. CBD might change how quickly the liver breaks down these medications. This could change the effects and side effects of these medications.  Medications changed by the liver (Cytochrome P450 2A6 (CYP2A6) substrates) Some medications are changed and broken down by the liver. CBD might change how quickly the liver breaks down these medications. This could change the effects and side effects of these medications.  Medications changed by the liver (Cytochrome P450 2B6 (CYP2B6) substrates) Some medications are changed and broken down by the liver. CBD might change how quickly the liver breaks down these medications. This could change the effects and side effects of these medications.  Medications changed by the liver (Cytochrome P450 2C19 (CYP2C19) substrates) Some medications are changed and broken down by the liver. CBD might change how quickly the liver breaks down these medications. This could change the effects and side effects of these medications.  Medications changed by the liver (Cytochrome P450 2C8 (CYP2C8) substrates) Some medications are changed and broken down by the liver. CBD might change how quickly the liver breaks down these medications. This could change the effects and side effects of these medications.  Medications changed by the liver (Cytochrome P450 2C9 (CYP2C9) substrates) Some medications are changed and broken down by the liver. CBD might change how quickly the liver breaks down these medications. This could change the effects and side effects of these medications.  Medications changed by the liver (Cytochrome P450 2D6 (CYP2D6) substrates) Some medications are changed and broken down by the liver. CBD might change how quickly the liver breaks down these  medications. This could change the effects and side effects of these medications.  Medications changed by the liver (Cytochrome P450 2E1 (CYP2E1) substrates) Some medications are changed and broken down by the liver. CBD might change how quickly the liver breaks down these medications. This could change the effects and side effects of these medications.  Medications changed by the liver (Cytochrome P450 3A4 (CYP3A4) substrates) Some medications are changed and broken down by  the liver. CBD might change how quickly the liver breaks down these medications. This could change the effects and side effects of these medications.  Medications changed by the liver (Glucuronidated drugs) Some medications are changed and broken down by the liver. CBD might change how quickly the liver breaks down these medications. This could change the effects and side effects of these medications.  Medications that decrease the breakdown of other medications by the liver (Cytochrome P450 2C19 (CYP2C19) inhibitors) CBD is changed and broken down by the liver. Some drugs decrease how quickly the liver changes and breaks down CBD. This could change the effects and side effects of CBD.  Medications that decrease the breakdown of other medications in the liver (Cytochrome P450 3A4 (CYP3A4) inhibitors) CBD is changed and broken down by the liver. Some drugs decrease how quickly the liver changes and breaks down CBD. This could change the effects and side effects of CBD.  Medications that increase breakdown of other medications by the liver (Cytochrome P450 3A4 (CYP3A4) inducers) CBD is changed and broken down by the liver. Some drugs increase how quickly the liver changes and breaks down CBD. This could change the effects and side effects of CBD.  Medications that increase the breakdown of other medications by the liver (Cytochrome P450 2C19 (CYP2C19) inducers) CBD is changed and broken down by the liver. Some drugs increase  how quickly the liver changes and breaks down CBD. This could change the effects and side effects of CBD.  Methadone (Dolophine) Methadone is broken down by the liver. CBD might decrease how quickly the liver breaks down methadone. Taking cannabidiol along with methadone might increase the effects and side effects of methadone.  Rufinamide (Banzel) Rufinamide is changed and broken down by the body. CBD might decrease how quickly the body breaks down rufinamide. This might increase levels of rufinamide in the body by a small amount.  Sedative medications (CNS depressants) CBD might cause sleepiness and slowed breathing. Some medications, called sedatives, can also cause sleepiness and slowed breathing. Taking CBD with sedative medications might cause breathing problems and/or too much sleepiness.  Sirolimus (Rapamune) Sirolimus is changed and broken down by the body. CBD might decrease how quickly the body breaks down sirolimus. This might increase levels of sirolimus in the body.  Stiripentol (Diacomit) Stiripentol is changed and broken down by the body. CBD might decrease how quickly the body breaks down stiripentol. This might increase levels of stiripentol in the body and increase its side effects.  Tacrolimus (Prograf) Tacrolimus is changed and broken down by the body. CBD might decrease how quickly the body breaks down tacrolimus. This might increase levels of tacrolimus in the body.  Tamoxifen (Soltamox) Tamoxifen is changed and broken down by the body. CBD might affect how quickly the body breaks down tamoxifen. This might affect levels of tamoxifen in the body.  Topiramate (Topamax) Topiramate is changed and broken down by the body. CBD might decrease how quickly the body breaks down topiramate. This might increase levels of topiramate in the body by a small amount.  Valproate Valproic acid can cause liver injury. Taking cannabidiol with valproic acid might increase the chance of  liver injury. CBD and/or valproic acid might need to be stopped, or the dose might need to be reduced.  Warfarin (Coumadin) CBD might increase levels of warfarin, which can increase the risk for bleeding. CBD and/or warfarin might need to be stopped, or the dose might need to be reduced.  Zonisamide Zonisamide is changed  and broken down by the body. CBD might decrease how quickly the body breaks down zonisamide. This might increase levels of zonisamide in the body by a small amount. (Last update: 05/21/2021) ____________________________________________________________________________________________  ____________________________________________________________________________________________  Drug Holidays (Slow)  What is a "Drug Holiday"? Drug Holiday: is the name given to the period of time during which a patient stops taking a medication(s) for the purpose of eliminating tolerance to the drug.  Benefits Improved effectiveness of opioids. Decreased opioid dose needed to achieve benefits. Improved pain with lesser dose.  What is tolerance? Tolerance: is the progressive decreased in effectiveness of a drug due to its repetitive use. With repetitive use, the body gets use to the medication and as a consequence, it loses its effectiveness. This is a common problem seen with opioid pain medications. As a result, a larger dose of the drug is needed to achieve the same effect that used to be obtained with a smaller dose.  How long should a "Drug Holiday" last? You should stay off of the pain medicine for at least 14 consecutive days. (2 weeks)  Should I stop the medicine "cold [place]"? No. You should always coordinate with your Pain Specialist so that he/she can provide you with the correct medication dose to make the transition as smoothly as possible.  How do I stop the medicine? Slowly. You will be instructed to decrease the daily amount of pills that you take by one (1) pill every seven  (7) days. This is called a "slow downward taper" of your dose. For example: if you normally take four (4) pills per day, you will be asked to drop this dose to three (3) pills per day for seven (7) days, then to two (2) pills per day for seven (7) days, then to one (1) per day for seven (7) days, and at the end of those last seven (7) days, this is when the "Drug Holiday" would start.   Will I have withdrawals? By doing a "slow downward taper" like this one, it is unlikely that you will experience any significant withdrawal symptoms. Typically, what triggers withdrawals is the sudden stop of a high dose opioid therapy. Withdrawals can usually be avoided by slowly decreasing the dose over a prolonged period of time. If you do not follow these instructions and decide to stop your medication abruptly, withdrawals may be possible.  What are withdrawals? Withdrawals: refers to the wide range of symptoms that occur after stopping or dramatically reducing opiate drugs after heavy and prolonged use. Withdrawal symptoms do not occur to patients that use low dose opioids, or those who take the medication sporadically. Contrary to benzodiazepine (example: Valium, Xanax, etc.) or alcohol withdrawals (Delirium Tremens), opioid withdrawals are not lethal. Withdrawals are the physical manifestation of the body getting rid of the excess receptors.  Expected Symptoms Early symptoms of withdrawal may include: Agitation Anxiety Muscle aches Increased tearing Insomnia Runny nose Sweating Yawning  Late symptoms of withdrawal may include: Abdominal cramping Diarrhea Dilated pupils Goose bumps Nausea Vomiting  Will I experience withdrawals? Due to the slow nature of the taper, it is very unlikely that you will experience any.  What is a slow taper? Taper: refers to the gradual decrease in dose.  (Last update:  10/11/2019) ____________________________________________________________________________________________

## 2021-05-26 NOTE — Progress Notes (Signed)
Nursing Pain Medication Assessment:  ?Safety precautions to be maintained throughout the outpatient stay will include: orient to surroundings, keep bed in low position, maintain call bell within reach at all times, provide assistance with transfer out of bed and ambulation.  ?Medication Inspection Compliance: Pill count conducted under aseptic conditions, in front of the patient. Neither the pills nor the bottle was removed from the patient's sight at any time. Once count was completed pills were immediately returned to the patient in their original bottle. ? ?Medication: Morphine IR ?Pill/Patch Count:  51 of 60 pills remain ?Pill/Patch Appearance: Markings consistent with prescribed medication ?Bottle Appearance: Standard pharmacy container. Clearly labeled. ?Filled Date: 3 / 2 / 2023 ?Last Medication intake:  Today ?

## 2021-05-27 ENCOUNTER — Other Ambulatory Visit: Payer: Self-pay

## 2021-05-27 ENCOUNTER — Encounter: Payer: Self-pay | Admitting: Pain Medicine

## 2021-05-27 ENCOUNTER — Ambulatory Visit: Payer: HMO | Attending: Pain Medicine | Admitting: Pain Medicine

## 2021-05-27 VITALS — BP 123/59 | HR 58 | Temp 97.2°F | Resp 16 | Ht 70.0 in | Wt 230.0 lb

## 2021-05-27 DIAGNOSIS — M1712 Unilateral primary osteoarthritis, left knee: Secondary | ICD-10-CM | POA: Diagnosis not present

## 2021-05-27 DIAGNOSIS — M25562 Pain in left knee: Secondary | ICD-10-CM | POA: Diagnosis not present

## 2021-05-27 DIAGNOSIS — G8929 Other chronic pain: Secondary | ICD-10-CM | POA: Diagnosis not present

## 2021-05-27 MED ORDER — LIDOCAINE HCL 2 % IJ SOLN: 20.0000 mL | Freq: Once | INTRAMUSCULAR | Status: DC

## 2021-05-27 MED ORDER — ROPIVACAINE HCL 2 MG/ML IJ SOLN
INTRAMUSCULAR | Status: AC
  Filled 2021-05-27: qty 20

## 2021-05-27 MED ORDER — SODIUM HYALURONATE (VISCOSUP) 20 MG/2ML IX SOSY: 2.0000 mL | PREFILLED_SYRINGE | Freq: Once | INTRA_ARTICULAR | Status: AC

## 2021-05-27 MED ORDER — LIDOCAINE HCL (PF) 1 % IJ SOLN
INTRAMUSCULAR | Status: AC
  Filled 2021-05-27: qty 5

## 2021-05-27 MED ORDER — LIDOCAINE HCL (PF) 2 % IJ SOLN
INTRAMUSCULAR | Status: AC
  Filled 2021-05-27: qty 5

## 2021-05-27 MED ORDER — LIDOCAINE HCL (PF) 1 % IJ SOLN
5.0000 mL | Freq: Once | INTRAMUSCULAR | Status: AC
  Administered 2021-05-27: 5 mL

## 2021-05-27 MED ORDER — ROPIVACAINE HCL 2 MG/ML IJ SOLN
4.0000 mL | Freq: Once | INTRAMUSCULAR | Status: AC
  Administered 2021-05-27: 20 mL via INTRA_ARTICULAR

## 2021-05-27 MED ORDER — PENTAFLUOROPROP-TETRAFLUOROETH EX AERO
INHALATION_SPRAY | Freq: Once | CUTANEOUS | Status: AC
  Administered 2021-05-27: 30 via TOPICAL
  Filled 2021-05-27: qty 116

## 2021-05-27 NOTE — Patient Instructions (Signed)

## 2021-05-27 NOTE — Progress Notes (Signed)
PROVIDER NOTE: Interpretation of information contained herein should be left to medically-trained personnel. Specific patient instructions are provided elsewhere under "Patient Instructions" section of medical record. This document was created in part using STT-dictation technology, any transcriptional errors that may result from this process are unintentional.  Patient: Howard Maxwell Type: Established DOB: February 26, 1946 MRN: [id number] PCP: Tracie Harrier, MD  Service: Procedure DOS: 05/27/2021 Setting: Ambulatory Location: Ambulatory outpatient facility Delivery: Face-to-face Provider: Gaspar Cola, MD Specialty: Interventional Pain Management Specialty designation: 09 Location: Outpatient facility Ref. Prov.: Tracie Harrier, MD    Primary Reason for Visit: Interventional Pain Management Treatment. CC: Knee Pain (Left )   Procedure:           Type: Hyalgan Intra-articular Knee Injection Laterality: Left (-LT) No.: 1   Series: 2 Level/approach: Lateral Imaging guidance: None required (CPT-20610) Anesthesia: Local anesthesia (1-2% Lidocaine) Anxiolysis: None                 Sedation: None.  Purpose: Diagnostic/Therapeutic Indications: Knee arthralgia associated to osteoarthritis of the knee 1. Chronic knee pain (Left)   2. 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 Tricompartmental disease of knee (Left)    NAS-11 score:   Pre-procedure: 3 /10   Post-procedure: 3 /10    Pre-Procedure Preparation  Monitoring: As per clinic protocol.  Risk Assessment: Vitals:  [id number] body mass index is 33 kg/m as calculated from the following:   Height as of this encounter: 5\' 10"  (1.778 m).   Weight as of this encounter: 230 lb (104.3 kg)., Rate:(!) 58 , BP:(!) 123/59, Resp:16, Temp:(!) 97.2 F (36.2 C), SpO2:95 %  Allergies: He is allergic to other and loratadine.  Precautions: No additional precautions required  Blood-thinner(s): None at  this time  Coagulopathies: Reviewed. None identified.   Active Infection(s): Reviewed. None identified. Howard Maxwell is afebrile   Location setting: Exam room Position: Sitting w/ knee bent 90 degrees Safety Precautions: Patient was assessed for positional comfort and pressure points before starting the procedure. Prepping solution: DuraPrep (Iodine Povacrylex [0.7% available iodine] and Isopropyl Alcohol, 74% w/w) Prep Area: Entire knee region Approach: percutaneous, just above the tibial plateau, lateral to the infrapatellar tendon. Intended target: Intra-articular knee space Materials: Tray: Block Needle(s): Regular Qty: 1/side Length: 1.5-inch Gauge: 25G   Meds ordered this encounter  Medications   lidocaine (XYLOCAINE) 2 % (with pres) injection 400 mg   pentafluoroprop-tetrafluoroeth (GEBAUERS) aerosol   lidocaine (PF) (XYLOCAINE) 1 % injection 5 mL   ropivacaine (PF) 2 mg/mL (0.2%) (NAROPIN) injection 4 mL   Sodium Hyaluronate SOSY 2 mL    Orders Placed This Encounter  Procedures   KNEE INJECTION    Indications: Knee arthralgia (pain) due to osteoarthritis (OA) Imaging: None (CPT-20610) Position: Sitting Equipment/Materials: Block tray   1.5", 25-G (one per side)   Local anesthetic   Hyalgan (one per side)    Scheduling Instructions:     Procedure: Knee injection Hyalgan (Hyaluronan/Hyaluronic acid)     Treatment No.: 1     Level: Intra-articular     Laterality: Left Knee     Sedation: Patient's choice.    Order Specific Question:   Where will this procedure be performed?    Answer:   [facility]   KNEE INJECTION    Indications: Knee arthralgia (pain) due to osteoarthritis (OA) Imaging: None (OXB-35329) Nursing: Please request pharmacy to have Hyalgan (Hyaluronan/Hyaluronic acid) available in the office.    Standing Status:   Future  Standing Expiration Date:   08/27/2021    Scheduling Instructions:     Procedure: Knee injection Hyalgan  (Hyaluronan/Hyaluronic acid)     Treatment No.: 2     Level: Intra-articular     Laterality: Left Knee     Sedation: Patient's choice.     Timeframe: in two (2) weeks    Order Specific Question:   Where will this procedure be performed?    Answer:   [facility]   Informed Consent Details: Physician/Practitioner Attestation; Transcribe to consent form and obtain patient signature    Note: Always confirm laterality of pain with Howard Maxwell, before procedure. Transcribe to consent form and obtain patient signature.    Order Specific Question:   Physician/Practitioner attestation of informed consent for procedure/surgical case    Answer:   I, the physician/practitioner, attest that I have discussed with the patient the benefits, risks, side effects, alternatives, likelihood of achieving goals and potential problems during recovery for the procedure that I have provided informed consent.    Order Specific Question:   Procedure    Answer:   Therapeutic, left sided, intra-articular viscosupplementation knee injection    Order Specific Question:   Physician/Practitioner performing the procedure    Answer:   Jamieson Maxwell A. Dossie Arbour, MD    Order Specific Question:   Indication/Reason    Answer:   Chronic left knee pain secondary to primary osteoarthritis of left knee (M17.12)   Provide equipment / supplies at bedside    "Block Tray" (Disposable   single use) Needle type: SpinalRegular Amount/quantity: 1 Size: Short(1.5-inch) Gauge: 25G    Standing Status:   Standing    Number of Occurrences:   1    Order Specific Question:   Specify    Answer:   Block Tray     Time-out: 1006 I initiated and conducted the "Time-out" before starting the procedure, as per protocol. The patient was asked to participate by confirming the accuracy of the "Time Out" information. Verification of the correct person, site, and procedure were performed and confirmed by me, the nursing staff, and the patient.  "Time-out" conducted as per Joint Commission's Universal Protocol (UP.01.01.01). Procedure checklist: Completed   H&P (Pre-op  Assessment)  Howard Maxwell is a 76 y.o. (year old), male patient, seen today for interventional treatment. He  has a past surgical history that includes Foot surgery (Bilateral); Anterior cervical decomp/discectomy fusion; Gallbladder surgery; Femur fracture surgery; Nasal reconstruction; Cardiac catheterization (12/07/2013); Back surgery; Cholecystectomy; Esophagogastroduodenoscopy (N/A, 12/18/2016); Colonoscopy with propofol (N/A, 12/18/2016); Cervical disc arthroplasty (N/A, 05/28/2017); Cataract extraction w/PHACO (Right, 06/22/2017); eye prosthesis; Anterior lateral lumbar fusion 4 levels (N/A, 08/23/2017); Lumbar percutaneous pedicle screw 4 level (N/A, 08/23/2017); Application of robotic assistance for spinal procedure (N/A, 08/23/2017); Eye surgery (Left, 02/04/2018); Drug induced endoscopy (N/A, 04/17/2020); Achilles tendonitis; Neck surgery (2002); Fracture surgery (7782); Tonsillectomy and adenoidectomy; Esophagogastroduodenoscopy (N/A, 05/10/2020); and Implantation of hypoglossal nerve stimulator (Right, 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 Mr. Ehrler has a current medication list which includes the following prescription(s): atorvastatin, azelastine hcl, calcium carb-cholecalciferol, carvedilol, cetirizine, cholecalciferol, coq10, elderberry, empagliflozin, ferrous sulfate, fluocinonide gel, glipizide, glucose blood, guaifenesin, ibuprofen, jardiance, losartan, magnesium, metformin, mirabegron er, [START ON 06/21/2021] morphine, [START ON 07/21/2021] morphine, [START ON 08/20/2021] morphine, multiple vitamins-minerals, viteyes omega-3 tg, omeprazole, polyethylene glycol 400, potassium, sennosides-docusate sodium, sodium fluoride, spironolactone, tadalafil, turmeric, vitamin b-12, and vitamin c, and the following Facility-Administered Medications: lidocaine and ondansetron (ZOFRAN) 4 mg in sodium chloride 0.9 % 50 mL  IVPB. His primarily concern today is the Knee  Pain (Left )  He is allergic to other and loratadine.   Last encounter: My last encounter with him was on 05/26/2021. Pertinent problems: Mr. 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); Musculoskeletal pain; Osteoarthritis of lumbar spine; Chronic low back pain (2ry area of Pain) (Bilateral) (R>L) w/o sciatica; Chronic hip pain (3ry area of Pain) (Bilateral) (R>L); Chronic neck pain (4th area of Pain) (Bilateral) (L>R); Chronic knee pain (Left); 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, C5-6, C6-7; 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) (s/p ACDF); 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); 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); Tricompartmental disease of knee (Left); 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); 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); 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); Lumbar facet syndrome (Bilateral) (R>L); Lumbar facet arthropathy (HCC) (Bilateral & Multilevel) (L2-3 to L5-S1); Lumbar facet joint synovial cysts (Right: L3-4 & L5-S1 & Left: L3-4); Lumbar radiculopathy (Bilateral); Weakness of proximal end of lower extremity (Bilateral); Chronic upper extremity pain (1ry area of Pain) (Bilateral) (L>R); 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 Pain Assessment: Severity of Chronic pain is reported as a 3 /10. Location: Knee Left/denies. Onset: More than a month ago. Quality: Discomfort, Constant, Aching. Timing: Constant. Modifying factor(s): nothing currently. Vitals:  height is 5\' 10"  (1.778 m) and weight is 230 lb (104.3 kg). His temporal temperature is 97.2 F (36.2 C) (abnormal). His blood pressure is 123/59 (abnormal) and his pulse is 58 (abnormal). His respiration is 16 and oxygen saturation is 95%.   Reason for encounter: "interventional pain management therapy due pain of at least four (4) weeks in duration, with failure to respond and/or inability to tolerate more conservative care.   Site Confirmation: Mr. Vinsant was asked to confirm the procedure and laterality before marking the site.  Consent: Before the procedure and under the influence of no sedative(s), amnesic(s), or anxiolytics, the patient was informed of the treatment options, risks and possible complications. To fulfill our ethical and legal obligations, as recommended by the American Medical Association's Code of Ethics, I have informed the patient of my clinical impression; the nature and purpose of the treatment or procedure; the risks, benefits, and possible complications of the intervention; the alternatives, including doing nothing; the risk(s) and benefit(s) of the alternative treatment(s) or procedure(s); and the risk(s) and benefit(s) of doing nothing. The patient was provided information about the general risks and possible complications associated with the procedure. These may include, but are not limited to: failure to achieve desired goals, infection, bleeding, organ or nerve damage, allergic reactions, paralysis, and death. In addition, the patient was informed of those risks and complications associated to Spine-related procedures, such as  failure to decrease pain; infection (i.e.: Meningitis, epidural or intraspinal abscess); bleeding (i.e.: epidural hematoma, subarachnoid hemorrhage, or any other type of intraspinal or peri-dural bleeding); organ or nerve damage (i.e.: Any type of peripheral nerve, nerve root, or spinal cord injury) with subsequent damage to sensory, motor,  and/or autonomic systems, resulting in permanent pain, numbness, and/or weakness of one or several areas of the body; allergic reactions; (i.e.: anaphylactic reaction); and/or death. Furthermore, the patient was informed of those risks and complications associated with the medications. These include, but are not limited to: allergic reactions (i.e.: anaphylactic or anaphylactoid reaction(s)); adrenal axis suppression; blood sugar elevation that in diabetics may result in ketoacidosis or comma; water retention that in patients with history of congestive heart failure may result in shortness of breath, pulmonary edema, and decompensation with resultant heart failure; weight gain; swelling or edema; medication-induced neural toxicity; particulate matter embolism and blood vessel occlusion with resultant organ, and/or nervous system infarction; and/or aseptic necrosis of one or more joints. Finally, the patient was informed that Medicine is not an exact science; therefore, there is also the possibility of unforeseen or unpredictable risks and/or possible complications that may result in a catastrophic outcome. The patient indicated having understood very clearly. We have given the patient no guarantees and we have made no promises. Enough time was given to the patient to ask questions, all of which were answered to the patient's satisfaction. Mr. Lagman has indicated that he wanted to continue with the procedure. Attestation: I, the ordering provider, attest that I have discussed with the patient the benefits, risks, side-effects, alternatives, likelihood of achieving goals, and  potential problems during recovery for the procedure that I have provided informed consent.  Date   Time: 05/27/2021  9:45 AM   Prophylactic antibiotics  Anti-infectives (From admission, onward)    None      Indication(s): None identified   Description of procedure   Start Time: 1007 hrs  Local Anesthesia: Once the patient was positioned, prepped, and time-out was completed. The target area was identified located. The skin was marked with an approved surgical skin marker. Once marked, the skin (epidermis, dermis, and hypodermis), and deeper tissues (fat, connective tissue and muscle) were infiltrated with a small amount of a short-acting local anesthetic, loaded on a 10cc syringe with a 25G, 1.5-in  Needle. An appropriate amount of time was allowed for local anesthetics to take effect before proceeding to the next step. Local Anesthetic: Lidocaine 1-2% The unused portion of the local anesthetic was discarded in the proper designated containers. Safety Precautions: Aspiration looking for blood return was conducted prior to all injections. At no point did I inject any substances, as a needle was being advanced. Before injecting, the patient was told to immediately notify me if he was experiencing any new onset of "ringing in the ears, or metallic taste in the mouth". No attempts were made at seeking any paresthesias. Safe injection practices and needle disposal techniques used. Medications properly checked for expiration dates. SDV (single dose vial) medications used. After the completion of the procedure, all disposable equipment used was discarded in the proper designated medical waste containers.  Technical description: Protocol guidelines were followed. After positioning, the target area was identified and prepped in the usual manner. Skin & deeper tissues infiltrated with local anesthetic. Appropriate amount of time allowed to pass for local anesthetics to take effect. Proper needle placement  secured. Once satisfactory needle placement was confirmed, I proceeded to inject the desired solution in slow, incremental fashion, intermittently assessing for discomfort or any signs of abnormal or undesired spread of substance. Once completed, the needle was removed and disposed of, as per hospital protocols. The area was cleaned, making sure to leave some of the prepping solution back to take advantage of its  long term bactericidal properties.  Aspiration:  Negative   Vitals:   05/27/21 0942  BP: (!) 123/59  Pulse: (!) 58  Resp: 16  Temp: (!) 97.2 F (36.2 C)  TempSrc: Temporal  SpO2: 95%  Weight: 230 lb (104.3 kg)  Height: 5\' 10"  (1.778 m)    End Time: 1008 hrs   Imaging guidance  Imaging-assisted Technique: None required. Indication(s): N/A Exposure Time: N/A Contrast: None Fluoroscopic Guidance: N/A Ultrasound Guidance: N/A Interpretation: N/A   Post-op assessment  Post-procedure Vital Signs:  Pulse/HCG Rate: (!) 58  Temp: (!) 97.2 F (36.2 C) Resp: 16 BP: (!) 123/59 SpO2: 95 %  EBL: None  Complications: No immediate post-treatment complications observed by team, or reported by patient.  Note: The patient tolerated the entire procedure well. A repeat set of vitals were taken after the procedure and the patient was kept under observation following institutional policy, for this type of procedure. Post-procedural neurological assessment was performed, showing return to baseline, prior to discharge. The patient was provided with post-procedure discharge instructions, including a section on how to identify potential problems. Should any problems arise concerning this procedure, the patient was given instructions to immediately contact us, at any time, without hesitation. In any case, we plan to contact the patient by telephone for a follow-up status report regarding this interventional procedure.  Comments:  No additional relevant information.   Plan of care  Chronic  Opioid Analgesic:  Previously on: Oxycodone IR 5 mg 1 tablet p.o. every 6 hours (20 mg/day of oxycodone).  Now on: Morphine ER (MS Contin) 15 mg every 12 hours (30 mg/day of morphine) (30 MME/day).  Due to national shortage. MME/day: 30 mg/day.   Medications administered: We administered pentafluoroprop-tetrafluoroeth, lidocaine (PF), ropivacaine (PF) 2 mg/mL (0.2%), and Sodium Hyaluronate.  Follow-up plan:   Return in about 2 weeks (around 06/10/2021) for [facility]) procedure: (L) IA Hyalgan #2.      Interventional Therapies  Risk   Complexity Considerations:   Estimated body mass index is 32.28 kg/m as calculated from the following:   Height as of this encounter: 5\' 10"  (1.778 m).   Weight as of this encounter: 225 lb (102.1 kg). WNL   Planned   Pending:   Therapeutic Left Hyalgan Knee inj. #1(S2)    Under consideration:   Diagnostic bilateral lumbar facet MBB  Diagnostic L3-4 interspinous ligament block  Possible L3-4 bilateral medial branch RFA  Diagnostic right L2-3 and/or L3-4 LESI  Diagnostic bilateral L1 & L4 TFESI  Diagnostic bilateral L2 TFESI  Diagnostic caudal ESI + Dx epidurogram  Diagnostic bilateral femoral and obturator NB  Diagnostic bilateral cervical facet MBB  Diagnostic left Genicular NB    Completed:   Palliative left Hyalgan knee injection x4 (01/28/2017)  Therapeutic bilateral L2 TFESI x1 (10/22/2016)  Therapeutic right L3-4 LESI x1 (12/01/2016)  Therapeutic/palliative right IA hip inj. x1 (09/07/2019) (100/100/90/90)  Therapeutic left SSNB x1 (11/30/2019)  Diagnostic/therapeutic left CESI x1 (02/06/2020)    Therapeutic   Palliative (PRN) options:   Palliative left Knee Hyalgan injection  Therapeutic bilateral L2 TFESI #2  Therapeutic right L3-4 LESI #2  Palliative right IA hip joint injection #2  Diagnostic left suprascapular NB #2  Diagnostic/therapeutic left CESI #2      Recent Visits Date Type Provider Dept  05/26/21 Office Visit Milinda Pointer, MD [facility]  Showing recent visits within past 90 days and meeting all other requirements Today's Visits Date Type Provider Dept  05/27/21 Procedure visit Dossie Arbour,  Beatriz Chancellor, MD [facility]  Showing today's visits and meeting all other requirements Future Appointments Date Type Provider Dept  06/17/21 Appointment Milinda Pointer, MD [facility]  Showing future appointments within next 90 days and meeting all other requirements   Disposition: Discharge home  Discharge (Date   Time): 05/27/2021; 1010 hrs.   Primary Care Physician: Tracie Harrier, MD Location: [facility] Note by: Gaspar Cola, MD Date: 05/27/2021; Time: 10:40 AM  DISCLAIMER: Medicine is not an Chief Strategy Officer. It has no guarantees or warranties. The decision to proceed with this intervention was based on the information collected from the patient. Conclusions were drawn from the patient's questionnaire, interview, and examination. Because information was provided in large part by the patient, it cannot be guaranteed that it has not been purposely or unconsciously manipulated or altered. Every effort has been made to obtain as much accurate, relevant, available data as possible. Always take into account that the treatment will also be dependent on availability of resources and existing treatment guidelines, considered by other Pain Management Specialists as being common knowledge and practice, at the time of the intervention. It is also important to point out that variation in procedural techniques and pharmacological choices are the acceptable norm. For Medico-Legal review purposes, the indications, contraindications, technique, and results of the these procedures should only be evaluated, judged and interpreted by a Board-Certified Interventional Pain Specialist with extensive familiarity and expertise in the same exact procedure and technique.

## 2021-05-27 NOTE — Progress Notes (Signed)
Safety precautions to be maintained throughout the outpatient stay will include: orient to surroundings, keep bed in low position, maintain call bell within reach at all times, provide assistance with transfer out of bed and ambulation.  

## 2021-05-28 ENCOUNTER — Telehealth: Payer: Self-pay | Admitting: *Deleted

## 2021-05-28 ENCOUNTER — Telehealth: Payer: Self-pay

## 2021-05-28 NOTE — Telephone Encounter (Signed)
Ordered Shoes -  ?[employer] [place] [address] 10[address] ?

## 2021-05-28 NOTE — Telephone Encounter (Signed)
Attempted to call for post procedure follow-up. Message left. 

## 2021-05-29 ENCOUNTER — Telehealth: Payer: Self-pay

## 2021-05-29 NOTE — Telephone Encounter (Signed)
Casts sent to [employer] ?

## 2021-06-02 DIAGNOSIS — Z6834 Body mass index (BMI) 34.0-34.9, adult: Secondary | ICD-10-CM | POA: Diagnosis not present

## 2021-06-02 DIAGNOSIS — I10 Essential (primary) hypertension: Secondary | ICD-10-CM | POA: Diagnosis not present

## 2021-06-02 DIAGNOSIS — F1120 Opioid dependence, uncomplicated: Secondary | ICD-10-CM | POA: Diagnosis not present

## 2021-06-02 DIAGNOSIS — R42 Dizziness and giddiness: Secondary | ICD-10-CM | POA: Diagnosis not present

## 2021-06-02 DIAGNOSIS — F330 Major depressive disorder, recurrent, mild: Secondary | ICD-10-CM | POA: Diagnosis not present

## 2021-06-02 DIAGNOSIS — I700 Atherosclerosis of aorta: Secondary | ICD-10-CM | POA: Diagnosis not present

## 2021-06-02 DIAGNOSIS — E1165 Type 2 diabetes mellitus with hyperglycemia: Secondary | ICD-10-CM | POA: Diagnosis not present

## 2021-06-02 DIAGNOSIS — M545 Low back pain, unspecified: Secondary | ICD-10-CM | POA: Diagnosis not present

## 2021-06-02 DIAGNOSIS — I2510 Atherosclerotic heart disease of native coronary artery without angina pectoris: Secondary | ICD-10-CM | POA: Diagnosis not present

## 2021-06-02 DIAGNOSIS — Z970 Presence of artificial eye: Secondary | ICD-10-CM | POA: Diagnosis not present

## 2021-06-02 DIAGNOSIS — Z0000 Encounter for general adult medical examination without abnormal findings: Secondary | ICD-10-CM | POA: Diagnosis not present

## 2021-06-02 DIAGNOSIS — Z87891 Personal history of nicotine dependence: Secondary | ICD-10-CM | POA: Diagnosis not present

## 2021-06-04 ENCOUNTER — Encounter: Payer: Self-pay | Admitting: Pulmonary Disease

## 2021-06-04 ENCOUNTER — Ambulatory Visit (INDEPENDENT_AMBULATORY_CARE_PROVIDER_SITE_OTHER): Payer: HMO | Admitting: Pulmonary Disease

## 2021-06-04 ENCOUNTER — Telehealth: Payer: Self-pay | Admitting: Pulmonary Disease

## 2021-06-04 ENCOUNTER — Other Ambulatory Visit: Payer: Self-pay

## 2021-06-04 VITALS — BP 122/80 | HR 65 | Temp 98.6°F | Ht 70.0 in | Wt 237.0 lb

## 2021-06-04 DIAGNOSIS — G4733 Obstructive sleep apnea (adult) (pediatric): Secondary | ICD-10-CM | POA: Diagnosis not present

## 2021-06-04 DIAGNOSIS — R0602 Shortness of breath: Secondary | ICD-10-CM

## 2021-06-04 DIAGNOSIS — G4734 Idiopathic sleep related nonobstructive alveolar hypoventilation: Secondary | ICD-10-CM

## 2021-06-04 NOTE — Patient Instructions (Signed)
?  X Obtain sleep studies from Dr Reece Levy Sadie Haber ? ?X schedule noct oximetry  ? ?X Schedule pFTs ?

## 2021-06-04 NOTE — Assessment & Plan Note (Signed)
Nocturnal hypoxia may be related to underlying pulmonary disease.  He is a heavy smoker and we will schedule PFTs to clarify.  The other possibility is chronic opiate use causing hypoventilation during sleep ?We will repeat nocturnal oximetry and if significant desaturations we can place him on nocturnal oxygen in the meantime. ?His echo does not show any evidence of pulmonary hypertension ?There is no reason to suspect obesity hypoventilation, no ABGs available however serum bicarbonate was 25-26 range, so I doubt that he has significant daytime hypercarbia ?

## 2021-06-04 NOTE — Telephone Encounter (Signed)
The patient will need to add Dr. Elsworth Soho to his team of doctors on his [employer] App for [place] to have access to his [employer].  ?

## 2021-06-04 NOTE — Telephone Encounter (Signed)
This is a consult for Dr. Elsworth Soho and he wants to know if you can pull a compliance report for the [facility] device? Please advise.  ? ?
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
## 2021-06-04 NOTE — Assessment & Plan Note (Signed)
He now has a hypoglossal nerve stimulator implant and residual events have dropped to 15/hour by report.  We will obtain home sleep test from [facility] to clarify and review ?He is very compliant by report and we will obtain a download to confirm objectively. ?Overall his symptoms of sleep apnea have significantly improved with improved daytime somnolence and decreased fatigue.  He is to have night sweats and this is completely resolved ?

## 2021-06-09 ENCOUNTER — Encounter: Payer: Medicare Other | Admitting: Pain Medicine

## 2021-06-09 NOTE — Telephone Encounter (Signed)
I called and left a message to have the patient call back. I was told to have him get Dr. Elsworth Soho added for his inspire since we have not been added to the inspire team. Waiting on call back.  ?

## 2021-06-10 NOTE — Telephone Encounter (Signed)
Spoke to patient.  ?He stated that he does not have an inspire app. He is not sure how to access this or add provider.  ? ?Howard Maxwell, can you assist with this?  ?

## 2021-06-11 NOTE — Telephone Encounter (Signed)
Per the patient he was put on the inspire by Dr. Ready and we are waiting on records from their office. [name] you can disregard.  ?

## 2021-06-16 ENCOUNTER — Other Ambulatory Visit: Payer: Self-pay

## 2021-06-16 ENCOUNTER — Ambulatory Visit (INDEPENDENT_AMBULATORY_CARE_PROVIDER_SITE_OTHER): Payer: HMO | Admitting: Podiatry

## 2021-06-16 ENCOUNTER — Encounter: Payer: Self-pay | Admitting: Podiatry

## 2021-06-16 DIAGNOSIS — L03032 Cellulitis of left toe: Secondary | ICD-10-CM | POA: Diagnosis not present

## 2021-06-16 NOTE — Patient Instructions (Signed)

## 2021-06-16 NOTE — Progress Notes (Signed)
He presents today as a diabetic and concerned because he stubbed his third toe on his left foot.  He states that has been bleeding under it and using a bloody fluid he is concerned. ? ?Objective: Vital signs are stable he is alert and oriented x3.  Pulses are palpable.  Third toe left foot does demonstrate blood beneath the nail with the distal aspect of the nail floating and there is appear to be some serosanguineous bloody drainage that does not appear to be infectious to the distal nail fold. ? ?Assessment: Subungual hematoma paronychia third digit left foot. ? ?Plan: An I&D was performed with avulsion of nail plate third digit left foot after local anesthetic was administered.  He tolerated procedure well.  There is no purulence no signs of infection so it was washed out thoroughly and then dressed with dry sterile compressive dressing.  He was start soaking Epsom salts and warm water tomorrow follow-up with me in a couple weeks to make sure he is doing well. ?

## 2021-06-17 ENCOUNTER — Encounter: Payer: Self-pay | Admitting: Pain Medicine

## 2021-06-17 ENCOUNTER — Ambulatory Visit: Payer: HMO | Attending: Pain Medicine | Admitting: Pain Medicine

## 2021-06-17 VITALS — BP 140/72 | HR 61 | Temp 97.3°F | Resp 18 | Ht 70.0 in | Wt 230.0 lb

## 2021-06-17 DIAGNOSIS — M25562 Pain in left knee: Secondary | ICD-10-CM | POA: Diagnosis not present

## 2021-06-17 DIAGNOSIS — G8929 Other chronic pain: Secondary | ICD-10-CM | POA: Diagnosis not present

## 2021-06-17 DIAGNOSIS — M1712 Unilateral primary osteoarthritis, left knee: Secondary | ICD-10-CM | POA: Diagnosis not present

## 2021-06-17 MED ORDER — ROPIVACAINE HCL 2 MG/ML IJ SOLN
5.0000 mL | Freq: Once | INTRAMUSCULAR | Status: AC
  Administered 2021-06-17: 5 mL via INTRA_ARTICULAR

## 2021-06-17 MED ORDER — PENTAFLUOROPROP-TETRAFLUOROETH EX AERO
INHALATION_SPRAY | Freq: Once | CUTANEOUS | Status: AC
  Administered 2021-06-17: 30 via TOPICAL

## 2021-06-17 MED ORDER — LIDOCAINE HCL (PF) 2 % IJ SOLN
INTRAMUSCULAR | Status: AC
  Filled 2021-06-17: qty 5

## 2021-06-17 MED ORDER — LIDOCAINE HCL (PF) 1 % IJ SOLN
5.0000 mL | Freq: Once | INTRAMUSCULAR | Status: AC
  Administered 2021-06-17: 5 mL

## 2021-06-17 MED ORDER — ROPIVACAINE HCL 2 MG/ML IJ SOLN
INTRAMUSCULAR | Status: AC
  Filled 2021-06-17: qty 20

## 2021-06-17 MED ORDER — SODIUM HYALURONATE (VISCOSUP) 20 MG/2ML IX SOSY
2.0000 mL | PREFILLED_SYRINGE | Freq: Once | INTRA_ARTICULAR | Status: AC
  Administered 2021-06-17: 2 mL via INTRA_ARTICULAR

## 2021-06-17 NOTE — Progress Notes (Signed)
Safety precautions to be maintained throughout the outpatient stay will include: orient to surroundings, keep bed in low position, maintain call bell within reach at all times, provide assistance with transfer out of bed and ambulation.  

## 2021-06-17 NOTE — Progress Notes (Signed)
PROVIDER NOTE: Interpretation of information contained herein should be left to medically-trained personnel. Specific patient instructions are provided elsewhere under "Patient Instructions" section of medical record. This document was created in part using STT-dictation technology, any transcriptional errors that may result from this process are unintentional.  ?Patient: Howard Maxwell ?Type: Established ?DOB: 1945/06/06 ?MRN: [id number] ?PCP: Tracie Harrier, MD  Service: Procedure ?DOS: 06/17/2021 ?Setting: Ambulatory ?Location: Ambulatory outpatient facility ?Delivery: Face-to-face Provider: Gaspar Cola, MD ?Specialty: Interventional Pain Management ?Specialty designation: 09 ?Location: Outpatient facility ?Ref. Prov.: Tracie Harrier, MD   ? ?Primary Reason for Visit: Interventional Pain Management Treatment. ?CC: Knee Pain (right) ?  ?Procedure:          ? Type: Hyalgan Intra-articular Knee Injection ?Laterality: Left (-LT) ?No.: 2  Series: n/a ?Level/approach: Lateral ?Imaging guidance: None required (CPT-20610) ?Anesthesia: Local anesthesia (1-2% Lidocaine) ?Anxiolysis: None                 ?Sedation: None. ? ?Purpose: Diagnostic/Therapeutic ?Indications: Knee arthralgia associated to osteoarthritis of the knee ?1. Chronic knee pain (Left)   ?2. 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 Tricompartmental disease of knee (Left)   ? ?NAS-11 score:  ? Pre-procedure: 2 /10  ? Post-procedure: 0-No pain/10  ?  ? ?Pre-Procedure Preparation  ?Monitoring: As per clinic protocol.  ?Risk Assessment: ?Vitals:  [id number] body mass index is 33 kg/m? as calculated from the following: ?  Height as of this encounter: 5\' 10"  (1.778 m). ?  Weight as of this encounter: 230 lb (104.3 kg)., Rate:61 , BP:140/72, Resp:18, Temp:(!) 97.3 ?F (36.3 ?C), SpO2:97 %  ?Allergies: He is allergic to loratadine and other.  ?Precautions: No additional precautions required  ?Blood-thinner(s): None  at this time  ?Coagulopathies: Reviewed. None identified.   ?Active Infection(s): Reviewed. None identified. Howard Maxwell is afebrile  ? ?Location setting: Exam room ?Position: Sitting w/ knee bent 90 degrees ?Safety Precautions: Patient was assessed for positional comfort and pressure points before starting the procedure. ?Prepping solution: DuraPrep (Iodine Povacrylex [0.7% available iodine] and Isopropyl Alcohol, 74% w/w) ?Prep Area: Entire knee region ?Approach: percutaneous, just above the tibial plateau, lateral to the infrapatellar tendon. ?Intended target: Intra-articular knee space ?Materials: ?Tray: Block ?Needle(s): Regular ?Qty: 1/side ?Length: 1.5-inch ?Gauge: 25G  ? ?Meds ordered this encounter  ?Medications  ? lidocaine (PF) (XYLOCAINE) 1 % injection 5 mL  ? ropivacaine (PF) 2 mg/mL (0.2%) (NAROPIN) injection 5 mL  ? pentafluoroprop-tetrafluoroeth (GEBAUERS) aerosol  ? Sodium Hyaluronate SOSY 2 mL  ?  Do not substitute. Deliver to facility day before procedure.  ?  Orders Placed This Encounter  ?Procedures  ? KNEE INJECTION  ?  Indications: Knee arthralgia (pain) due to osteoarthritis (OA) ?Imaging: None (CPT-20610) ?Position: Sitting ?Equipment/Materials: Block tray  1.5", 25-G (one per side)  Local anesthetic  Hyalgan (one per side)  ?  Scheduling Instructions:  ?   Procedure: Knee injection Hyalgan (Hyaluronan/Hyaluronic acid)  ?   Treatment No.: 2  ?   Level: Intra-articular  ?   Laterality: Left Knee  ?   Sedation: Patient's choice.  ?  Order Specific Question:   Where will this procedure be performed?  ?  Answer:   [facility]  ? KNEE INJECTION  ?  Indications: Knee arthralgia (pain) due to osteoarthritis (OA) ?Imaging: None (CPT-20610) ?Nursing: Please request pharmacy to have Hyalgan (Hyaluronan/Hyaluronic acid) available in the office.  ?  Standing Status:   Future  ?  Standing Expiration Date:  09/17/2021  ?  Scheduling Instructions:  ?   Procedure: Knee injection Hyalgan  (Hyaluronan/Hyaluronic acid)  ?   Treatment No.: 3  ?   Level: Intra-articular  ?   Laterality: Left Knee  ?   Sedation: Patient's choice.  ?   Timeframe: in two (2) weeks  ?  Order Specific Question:   Where will this procedure be performed?  ?  Answer:   [facility]  ? Informed Consent Details: Physician/Practitioner Attestation; Transcribe to consent form and obtain patient signature  ?  Note: Always confirm laterality of pain with Howard Maxwell, before procedure. ?Transcribe to consent form and obtain patient signature.  ?  Order Specific Question:   Physician/Practitioner attestation of informed consent for procedure/surgical case  ?  Answer:   I, the physician/practitioner, attest that I have discussed with the patient the benefits, risks, side effects, alternatives, likelihood of achieving goals and potential problems during recovery for the procedure that I have provided informed consent.  ?  Order Specific Question:   Procedure  ?  Answer:   Therapeutic, left sided, intra-articular viscosupplementation knee injection  ?  Order Specific Question:   Physician/Practitioner performing the procedure  ?  Answer:   Crystina Borrayo A. Dossie Arbour, MD  ?  Order Specific Question:   Indication/Reason  ?  Answer:   Chronic left knee pain secondary to primary osteoarthritis of left knee (M17.12)  ? Provide equipment / supplies at bedside  ?  "Block Tray" (Disposable  single use) ?Needle type: SpinalRegular ?Amount/quantity: 1 ?Size: Short(1.5-inch) ?Gauge: 25G  ?  Standing Status:   Standing  ?  Number of Occurrences:   1  ?  Order Specific Question:   Specify  ?  Answer:   Block Tray  ?  ? ?Time-out: 1030 I initiated and conducted the "Time-out" before starting the procedure, as per protocol. The patient was asked to participate by confirming the accuracy of the "Time Out" information. Verification of the correct person, site, and procedure were performed and confirmed by me, the nursing staff, and the patient.  "Time-out" conducted as per Joint Commission's Universal Protocol (UP.01.01.01). ?Procedure checklist: Completed  ? ?H&P (Pre-op  Assessment)  ?Howard Maxwell is a 76 y.o. (year old), male patient, seen today for interventional treatment. He  has a past surgical history that includes Foot surgery (Bilateral); Anterior cervical decomp/discectomy fusion; Gallbladder surgery; Femur fracture surgery; Nasal reconstruction; Cardiac catheterization (12/07/2013); Back surgery; Cholecystectomy; Esophagogastroduodenoscopy (N/A, 12/18/2016); Colonoscopy with propofol (N/A, 12/18/2016); Cervical disc arthroplasty (N/A, 05/28/2017); Cataract extraction w/PHACO (Right, 06/22/2017); eye prosthesis; Anterior lateral lumbar fusion 4 levels (N/A, 08/23/2017); Lumbar percutaneous pedicle screw 4 level (N/A, 08/23/2017); Application of robotic assistance for spinal procedure (N/A, 08/23/2017); Eye surgery (Left, 02/04/2018); Drug induced endoscopy (N/A, 04/17/2020); Achilles tendonitis; Neck surgery (2002); Fracture surgery (6834); Tonsillectomy and adenoidectomy; Esophagogastroduodenoscopy (N/A, 05/10/2020); and Implantation of hypoglossal nerve stimulator (Right, 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 Howard Maxwell has a current medication list which includes the following prescription(s): atorvastatin, azelastine hcl, calcium carb-cholecalciferol, carvedilol, cetirizine, cholecalciferol, coq10, elderberry, empagliflozin, ferrous sulfate, fluocinonide gel, glipizide, glucose blood, guaifenesin, ibuprofen, jardiance, losartan, magnesium, metformin, mirabegron er, [START ON 06/21/2021] morphine, [START ON 07/21/2021] morphine, [START ON 08/20/2021] morphine, multiple vitamins-minerals, viteyes omega-3 tg, omeprazole, polyethylene glycol 400, potassium, sennosides-docusate sodium, sodium fluoride, spironolactone, tadalafil, vitamin b-12, vitamin c, and turmeric, and the following Facility-Administered Medications: ondansetron (ZOFRAN) 4 mg in sodium chloride 0.9 % 50 mL IVPB. His  primarily concern today is the Knee Pain (right) ? He is allergic to loratadine  and other.  ? ?Last encounter: My last encounter with him was on 05/27/2021. ?Pertinent problems: Howard Maxwell has DDD (degenerative disc disease)

## 2021-06-17 NOTE — Patient Instructions (Signed)
____________________________________________________________________________________________ ? ?Post-Procedure Discharge Instructions ? ?Instructions: ?Apply ice:  ?Purpose: This will minimize any swelling and discomfort after procedure.  ?When: Day of procedure, as soon as you get home. ?How: Fill a plastic sandwich bag with crushed ice. Cover it with a small towel and apply to injection site. ?How long: (15 min on, 15 min off) Apply for 15 minutes then remove x 15 minutes.  Repeat sequence on day of procedure, until you go to bed. ?Apply heat:  ?Purpose: To treat any soreness and discomfort from the procedure. ?When: Starting the next day after the procedure. ?How: Apply heat to procedure site starting the day following the procedure. ?How long: May continue to repeat daily, until discomfort goes away. ?Food intake: Start with clear liquids (like water) and advance to regular food, as tolerated.  ?Physical activities: Keep activities to a minimum for the first 8 hours after the procedure. After that, then as tolerated. ?Driving: If you have received any sedation, be responsible and do not drive. You are not allowed to drive for 24 hours after having sedation. ?Blood thinner: (Applies only to those taking blood thinners) You may restart your blood thinner 6 hours after your procedure. ?Insulin: (Applies only to Diabetic patients taking insulin) As soon as you can eat, you may resume your normal dosing schedule. ?Infection prevention: Keep procedure site clean and dry. Shower daily and clean area with soap and water. ?Post-procedure Pain Diary: Extremely important that this be done correctly and accurately. Recorded information will be used to determine the next step in treatment. For the purpose of accuracy, follow these rules: ?Evaluate only the area treated. Do not report or include pain from an untreated area. For the purpose of this evaluation, ignore all other areas of pain, except for the treated area. ?After  your procedure, avoid taking a long nap and attempting to complete the pain diary after you wake up. Instead, set your alarm clock to go off every hour, on the hour, for the initial 8 hours after the procedure. Document the duration of the numbing medicine, and the relief you are getting from it. ?Do not go to sleep and attempt to complete it later. It will not be accurate. If you received sedation, it is likely that you were given a medication that may cause amnesia. Because of this, completing the diary at a later time may cause the information to be inaccurate. This information is needed to plan your care. ?Follow-up appointment: Keep your post-procedure follow-up evaluation appointment after the procedure (usually 2 weeks for most procedures, 6 weeks for radiofrequencies). DO NOT FORGET to bring you pain diary with you.  ? ?Expect: (What should I expect to see with my procedure?) ?From numbing medicine (AKA: Local Anesthetics): Numbness or decrease in pain. You may also experience some weakness, which if present, could last for the duration of the local anesthetic. ?Onset: Full effect within 15 minutes of injected. ?Duration: It will depend on the type of local anesthetic used. On the average, 1 to 8 hours.  ?From steroids (Applies only if steroids were used): Decrease in swelling or inflammation. Once inflammation is improved, relief of the pain will follow. ?Onset of benefits: Depends on the amount of swelling present. The more swelling, the longer it will take for the benefits to be seen. In some cases, up to 10 days. ?Duration: Steroids will stay in the system x 2 weeks. Duration of benefits will depend on multiple posibilities including persistent irritating factors. ?Side-effects: If present, they  may typically last 2 weeks (the duration of the steroids). ?Frequent: Cramps (if they occur, drink Gatorade and take over-the-counter Magnesium 450-500 mg once to twice a day); water retention with temporary  weight gain; increases in blood sugar; decreased immune system response; increased appetite. ?Occasional: Facial flushing (red, warm cheeks); mood swings; menstrual changes. ?Uncommon: Long-term decrease or suppression of natural hormones; bone thinning. (These are more common with higher doses or more frequent use. This is why we prefer that our patients avoid having any injection therapies in other practices.)  ?Very Rare: Severe mood changes; psychosis; aseptic necrosis. ?From procedure: Some discomfort is to be expected once the numbing medicine wears off. This should be minimal if ice and heat are applied as instructed. ? ?Call if: (When should I call?) ?You experience numbness and weakness that gets worse with time, as opposed to wearing off. ?New onset bowel or bladder incontinence. (Applies only to procedures done in the spine) ? ?Emergency Numbers: ?[facility] business hours (Monday - Thursday, 8:00 AM - 4:00 PM) (Friday, 9:00 AM - 12:00 Noon): [phone] ?After hours: [phone] ?NOTE: If you are having a problem and are unable connect with, or to talk to a provider, then go to your nearest urgent care or emergency department. If the problem is serious and urgent, please call 911. ?____________________________________________________________________________________________ ? ______________________________________________________________________ ? ?Preparing for your procedure (without sedation) ? ?Procedure appointments are limited to planned procedures: ?No Prescription Refills. ?No disability issues will be discussed. ?No medication changes will be discussed. ? ?Instructions: ?Oral Intake: Do not eat or drink anything for at least 6 hours prior to your procedure. (Exception: Blood Pressure Medication. See below.) ?Transportation: Unless otherwise stated by your physician, you may drive yourself after the procedure. ?Blood Pressure Medicine: Do not forget to take your blood pressure medicine with a  sip of water the morning of the procedure. If your Diastolic (lower reading)is above 100 mmHg, elective cases will be cancelled/rescheduled. ?Blood thinners: These will need to be stopped for procedures. Notify our staff if you are taking any blood thinners. Depending on which one you take, there will be specific instructions on how and when to stop it. ?Diabetics on insulin: Notify the staff so that you can be scheduled 1st case in the morning. If your diabetes requires high dose insulin, take only ? of your normal insulin dose the morning of the procedure and notify the staff that you have done so. ?Preventing infections: Shower with an antibacterial soap the morning of your procedure.  ?Build-up your immune system: Take 1000 mg of Vitamin C with every meal (3 times a day) the day prior to your procedure. ?Antibiotics: Inform the staff if you have a condition or reason that requires you to take antibiotics before dental procedures. ?Pregnancy: If you are pregnant, call and cancel the procedure. ?Sickness: If you have a cold, fever, or any active infections, call and cancel the procedure. ?Arrival: You must be in the facility at least 30 minutes prior to your scheduled procedure. ?Children: Do not bring any children with you. ?Dress appropriately: Bring dark clothing that you would not mind if they get stained. ?Valuables: Do not bring any jewelry or valuables. ? ?Reasons to call and reschedule or cancel your procedure: (Following these recommendations will minimize the risk of a serious complication.) ?Surgeries: Avoid having procedures within 2 weeks of any surgery. (Avoid for 2 weeks before or after any surgery). ?Flu Shots: Avoid having procedures within 2 weeks of a flu shots or . (  Avoid for 2 weeks before or after immunizations). ?Barium: Avoid having a procedure within 7-10 days after having had a radiological study involving the use of radiological contrast. (Myelograms, Barium swallow or enema  study). ?Heart attacks: Avoid any elective procedures or surgeries for the initial 6 months after a "Myocardial Infarction" (Heart Attack). ?Blood thinners: It is imperative that you stop these medications before pro

## 2021-06-18 ENCOUNTER — Telehealth: Payer: Self-pay

## 2021-06-18 NOTE — Telephone Encounter (Signed)
Post procedure follow up phone call.  Patient states he is doing well.  ?

## 2021-06-23 ENCOUNTER — Other Ambulatory Visit: Payer: Self-pay | Admitting: *Deleted

## 2021-06-23 ENCOUNTER — Telehealth: Payer: Self-pay | Admitting: Pain Medicine

## 2021-06-23 ENCOUNTER — Telehealth: Payer: Self-pay | Admitting: *Deleted

## 2021-06-23 DIAGNOSIS — M5414 Radiculopathy, thoracic region: Secondary | ICD-10-CM

## 2021-06-23 DIAGNOSIS — Z79891 Long term (current) use of opiate analgesic: Secondary | ICD-10-CM

## 2021-06-23 DIAGNOSIS — Z79899 Other long term (current) drug therapy: Secondary | ICD-10-CM

## 2021-06-23 DIAGNOSIS — G894 Chronic pain syndrome: Secondary | ICD-10-CM

## 2021-06-23 DIAGNOSIS — M545 Low back pain, unspecified: Secondary | ICD-10-CM

## 2021-06-23 DIAGNOSIS — G8929 Other chronic pain: Secondary | ICD-10-CM

## 2021-06-23 DIAGNOSIS — M961 Postlaminectomy syndrome, not elsewhere classified: Secondary | ICD-10-CM

## 2021-06-23 MED ORDER — MORPHINE SULFATE ER 15 MG PO TBCR: 15.0000 mg | EXTENDED_RELEASE_TABLET | Freq: Two times a day (BID) | ORAL | 0 refills | Status: DC

## 2021-06-23 NOTE — Telephone Encounter (Signed)
Cancelled three Rx for Morphine at [facility]. ?

## 2021-06-23 NOTE — Telephone Encounter (Signed)
Refill request sent because his pharmacy did not have stock of his medication ?

## 2021-06-23 NOTE — Telephone Encounter (Signed)
Rx sent to Dr. Dossie Arbour. ?

## 2021-06-30 ENCOUNTER — Ambulatory Visit: Payer: HMO

## 2021-06-30 ENCOUNTER — Ambulatory Visit: Payer: HMO | Admitting: Podiatry

## 2021-06-30 DIAGNOSIS — J40 Bronchitis, not specified as acute or chronic: Secondary | ICD-10-CM | POA: Diagnosis not present

## 2021-06-30 DIAGNOSIS — J019 Acute sinusitis, unspecified: Secondary | ICD-10-CM | POA: Diagnosis not present

## 2021-07-01 ENCOUNTER — Encounter: Payer: Self-pay | Admitting: Pain Medicine

## 2021-07-01 ENCOUNTER — Ambulatory Visit: Payer: HMO | Attending: Pain Medicine | Admitting: Pain Medicine

## 2021-07-01 VITALS — BP 118/70 | HR 62 | Temp 97.1°F | Resp 18 | Ht 70.0 in | Wt 230.0 lb

## 2021-07-01 DIAGNOSIS — M25562 Pain in left knee: Secondary | ICD-10-CM | POA: Diagnosis not present

## 2021-07-01 DIAGNOSIS — G8929 Other chronic pain: Secondary | ICD-10-CM | POA: Insufficient documentation

## 2021-07-01 DIAGNOSIS — M1712 Unilateral primary osteoarthritis, left knee: Secondary | ICD-10-CM | POA: Diagnosis not present

## 2021-07-01 MED ORDER — SODIUM HYALURONATE (VISCOSUP) 20 MG/2ML IX SOSY
2.0000 mL | PREFILLED_SYRINGE | Freq: Once | INTRA_ARTICULAR | Status: AC
  Administered 2021-07-01: 2 mL via INTRA_ARTICULAR

## 2021-07-01 MED ORDER — ROPIVACAINE HCL 2 MG/ML IJ SOLN
5.0000 mL | Freq: Once | INTRAMUSCULAR | Status: AC
  Administered 2021-07-01: 5 mL via INTRA_ARTICULAR
  Filled 2021-07-01: qty 20

## 2021-07-01 MED ORDER — LIDOCAINE HCL (PF) 1 % IJ SOLN
5.0000 mL | Freq: Once | INTRAMUSCULAR | Status: AC
  Administered 2021-07-01: 5 mL
  Filled 2021-07-01: qty 5

## 2021-07-01 NOTE — Patient Instructions (Signed)

## 2021-07-01 NOTE — Progress Notes (Signed)
Safety precautions to be maintained throughout the outpatient stay will include: orient to surroundings, keep bed in low position, maintain call bell within reach at all times, provide assistance with transfer out of bed and ambulation.  

## 2021-07-01 NOTE — Progress Notes (Signed)
PROVIDER NOTE: Interpretation of information contained herein should be left to medically-trained personnel. Specific patient instructions are provided elsewhere under "Patient Instructions" section of medical record. This document was created in part using STT-dictation technology, any transcriptional errors that may result from this process are unintentional.  ?Patient: Howard Maxwell ?Type: Established ?DOB: [birth year] ?MRN: [id number] ?PCP: Tracie Harrier, MD  Service: Procedure ?DOS: 07/01/2021 ?Setting: Ambulatory ?Location: Ambulatory outpatient facility ?Delivery: Face-to-face Provider: Gaspar Cola, MD ?Specialty: Interventional Pain Management ?Specialty designation: 09 ?Location: Outpatient facility ?Ref. Prov.: Tracie Harrier, MD   ? ?Primary Reason for Visit: Interventional Pain Management Treatment. ?CC: Knee Pain (left) ?  ?Procedure:          ? Type: Hyalgan Intra-articular Knee Injection ?Laterality: Left (-LT) ?No.: 3  Series: 2 ?Level/approach: Lateral ?Imaging guidance: None required (CPT-20610) ?Anesthesia: Local anesthesia (1-2% Lidocaine) ?Anxiolysis: None                 ?Sedation: None. ? ?Purpose: Diagnostic/Therapeutic ?Indications: Knee arthralgia associated to osteoarthritis of the knee ?1. Chronic knee pain (Left)   ?2. 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 Tricompartmental disease of knee (Left)   ? ?NAS-11 score:  ? Pre-procedure: 2 /10  ? Post-procedure: 0-No pain/10  ?  ? ?Post-procedure evaluation  ? Type: Hyalgan Intra-articular Knee Injection ?Laterality: Left (-LT) ?No.: 2  Series: n/a ?Level/approach: Lateral ?Imaging guidance: None required (CPT-20610) ?Anesthesia: Local anesthesia (1-2% Lidocaine) ?Anxiolysis: None                 ?Sedation: None. ? ?Purpose: Diagnostic/Therapeutic ?Indications: Knee arthralgia associated to osteoarthritis of the knee ?1. Chronic knee pain (Left)   ?2. Osteoarthritis of knee (Tricompartmental  degenerative changes) (Left)   ?3. Tricompartmental disease of knee (Left)   ? ?NAS-11 score:  ? Pre-procedure: 2 /10  ? Post-procedure: 0-No pain/10  ?   ?Effectiveness:  ?Initial hour after procedure: 100 %. ?Subsequent 4-6 hours post-procedure: 100 %. ?Analgesia past initial 6 hours: 40 %. ?Ongoing improvement:  ?Analgesic: The patient indicates having an ongoing 40% relief of the left knee pain with the 2/3 Hyalgan knee injections ?Function: Howard Maxwell reports improvement in function ?ROM: Howard Maxwell reports improvement in ROM ? ?Pre-Procedure Preparation  ?Monitoring: As per clinic protocol.  ?Risk Assessment: ?Vitals:  [id number] body mass index is 33 kg/m? as calculated from the following: ?  Height as of this encounter: 5\' 10"  (1.778 m). ?  Weight as of this encounter: 230 lb (104.3 kg)., Rate:60 , BP:114/71, Resp:18, Temp:(!) 97.1 ?F (36.2 ?C), SpO2:95 %  ?Allergies: He is allergic to loratadine and other.  ?Precautions: No additional precautions required  ?Blood-thinner(s): None at this time  ?Coagulopathies: Reviewed. None identified.   ?Active Infection(s): Reviewed. None identified. Howard Maxwell is afebrile  ? ?Location setting: Exam room ?Position: Sitting w/ knee bent 90 degrees ?Safety Precautions: Patient was assessed for positional comfort and pressure points before starting the procedure. ?Prepping solution: DuraPrep (Iodine Povacrylex [0.7% available iodine] and Isopropyl Alcohol, 74% w/w) ?Prep Area: Entire knee region ?Approach: percutaneous, just above the tibial plateau, lateral to the infrapatellar tendon. ?Intended target: Intra-articular knee space ?Materials: ?Tray: Block ?Needle(s): Regular ?Qty: 1/side ?Length: 1.5-inch ?Gauge: 25G  ? ?Meds ordered this encounter  ?Medications  ? lidocaine (PF) (XYLOCAINE) 1 % injection 5 mL  ? ropivacaine (PF) 2 mg/mL (0.2%) (NAROPIN) injection 5 mL  ? Sodium Hyaluronate SOSY 2 mL  ?  Do not substitute. Deliver to facility day before  procedure.   ?  Orders Placed This Encounter  ?Procedures  ? KNEE INJECTION  ?  Indications: Knee arthralgia (pain) due to osteoarthritis (OA) ?Imaging: None (CPT-20610) ?Position: Sitting ?Equipment/Materials: Block tray  1.5", 25-G (one per side)  Local anesthetic  Hyalgan (one per side)  ?  Scheduling Instructions:  ?   Procedure: Knee injection Hyalgan (Hyaluronan/Hyaluronic acid)  ?   Treatment No.: 1  ?   Level: Intra-articular  ?   Laterality: Left Knee  ?   Sedation: Patient's choice.  ?  Order Specific Question:   Where will this procedure be performed?  ?  Answer:   [facility]  ? Informed Consent Details: Physician/Practitioner Attestation; Transcribe to consent form and obtain patient signature  ?  Note: Always confirm laterality of pain with Howard Maxwell, before procedure. ?Transcribe to consent form and obtain patient signature.  ?  Order Specific Question:   Physician/Practitioner attestation of informed consent for procedure/surgical case  ?  Answer:   I, the physician/practitioner, attest that I have discussed with the patient the benefits, risks, side effects, alternatives, likelihood of achieving goals and potential problems during recovery for the procedure that I have provided informed consent.  ?  Order Specific Question:   Procedure  ?  Answer:   Therapeutic, left sided, intra-articular viscosupplementation knee injection  ?  Order Specific Question:   Physician/Practitioner performing the procedure  ?  Answer:   Leray Garverick A. Dossie Arbour, MD  ?  Order Specific Question:   Indication/Reason  ?  Answer:   Chronic left knee pain secondary to primary osteoarthritis of left knee (M17.12)  ? Provide equipment / supplies at bedside  ?  "Block Tray" (Disposable  single use) ?Needle type: SpinalRegular ?Amount/quantity: 1 ?Size: Short(1.5-inch) ?Gauge: 25G  ?  Standing Status:   Standing  ?  Number of Occurrences:   1  ?  Order Specific Question:   Specify  ?  Answer:   Block Tray  ?  ? ?Time-out: 0906 I  initiated and conducted the "Time-out" before starting the procedure, as per protocol. The patient was asked to participate by confirming the accuracy of the "Time Out" information. Verification of the correct person, site, and procedure were performed and confirmed by me, the nursing staff, and the patient. "Time-out" conducted as per Joint Commission's Universal Protocol (UP.01.01.01). ?Procedure checklist: Completed  ? ?H&P (Pre-op  Assessment)  ?Howard Maxwell is a 76 y.o. (year old), male patient, seen today for interventional treatment. He  has a past surgical history that includes Foot surgery (Bilateral); Anterior cervical decomp/discectomy fusion; Gallbladder surgery; Femur fracture surgery; Nasal reconstruction; Cardiac catheterization (12/07/2013); Back surgery; Cholecystectomy; Esophagogastroduodenoscopy (N/A, 12/18/2016); Colonoscopy with propofol (N/A, 12/18/2016); Cervical disc arthroplasty (N/A, 05/28/2017); Cataract extraction w/PHACO (Right, 06/22/2017); eye prosthesis; Anterior lateral lumbar fusion 4 levels (N/A, 08/23/2017); Lumbar percutaneous pedicle screw 4 level (N/A, 08/23/2017); Application of robotic assistance for spinal procedure (N/A, 08/23/2017); Eye surgery (Left, 02/04/2018); Drug induced endoscopy (N/A, 04/17/2020); Achilles tendonitis; Neck surgery (2002); Fracture surgery (0258); Tonsillectomy and adenoidectomy; Esophagogastroduodenoscopy (N/A, 05/10/2020); and Implantation of hypoglossal nerve stimulator (Right, 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 Howard Maxwell has a current medication list which includes the following prescription(s): amoxicillin, atorvastatin, azelastine hcl, calcium carb-cholecalciferol, carvedilol, cetirizine, cholecalciferol, coq10, elderberry, empagliflozin, ferrous sulfate, fluocinonide gel, glipizide, glucose blood, guaifenesin, ibuprofen, jardiance, losartan, magnesium, metformin, mirabegron er, morphine, [START ON 07/21/2021] morphine, [START ON 08/20/2021] morphine, multiple vitamins-minerals,  viteyes omega-3 tg, omeprazole, polyethylene glycol 400, potassium, sennosides-docusate sodium, sodium fluoride, spironolactone, tadalafil, vitamin b-12, turmeric,  and vitamin c, and the following Facility-[facility]

## 2021-07-02 ENCOUNTER — Telehealth: Payer: Self-pay

## 2021-07-02 NOTE — Telephone Encounter (Signed)
Called pp. Left message to call if needed. ?

## 2021-07-06 ENCOUNTER — Other Ambulatory Visit (HOSPITAL_COMMUNITY): Payer: Self-pay

## 2021-07-06 MED FILL — Carvedilol Tab 6.25 MG: ORAL | 90 days supply | Qty: 180 | Fill #0 | Status: AC

## 2021-07-07 ENCOUNTER — Other Ambulatory Visit (HOSPITAL_COMMUNITY): Payer: Self-pay

## 2021-07-07 MED ORDER — EMPAGLIFLOZIN 10 MG PO TABS
ORAL_TABLET | ORAL | 2 refills | Status: DC
  Filled 2021-07-07: qty 90, 90d supply, fill #0
  Filled 2021-12-07: qty 90, 90d supply, fill #1
  Filled 2022-03-23: qty 90, 90d supply, fill #2

## 2021-07-08 ENCOUNTER — Other Ambulatory Visit (HOSPITAL_COMMUNITY): Payer: Self-pay

## 2021-07-18 ENCOUNTER — Other Ambulatory Visit: Payer: Self-pay | Admitting: Pain Medicine

## 2021-07-18 DIAGNOSIS — G894 Chronic pain syndrome: Secondary | ICD-10-CM

## 2021-07-18 DIAGNOSIS — Z79891 Long term (current) use of opiate analgesic: Secondary | ICD-10-CM

## 2021-07-18 DIAGNOSIS — G8929 Other chronic pain: Secondary | ICD-10-CM

## 2021-07-18 DIAGNOSIS — Z79899 Other long term (current) drug therapy: Secondary | ICD-10-CM

## 2021-07-18 DIAGNOSIS — M961 Postlaminectomy syndrome, not elsewhere classified: Secondary | ICD-10-CM

## 2021-07-18 DIAGNOSIS — M545 Low back pain, unspecified: Secondary | ICD-10-CM

## 2021-07-18 DIAGNOSIS — M5414 Radiculopathy, thoracic region: Secondary | ICD-10-CM
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
## 2021-07-22 ENCOUNTER — Ambulatory Visit: Payer: HMO | Attending: Pain Medicine | Admitting: Pain Medicine

## 2021-07-22 DIAGNOSIS — M25562 Pain in left knee: Secondary | ICD-10-CM

## 2021-07-22 DIAGNOSIS — G8929 Other chronic pain: Secondary | ICD-10-CM

## 2021-07-22 DIAGNOSIS — M1712 Unilateral primary osteoarthritis, left knee: Secondary | ICD-10-CM

## 2021-07-22 DIAGNOSIS — G894 Chronic pain syndrome: Secondary | ICD-10-CM

## 2021-07-24 ENCOUNTER — Other Ambulatory Visit: Payer: Self-pay | Admitting: Cardiovascular Disease

## 2021-07-24 MED FILL — Losartan Potassium Tab 100 MG: ORAL | 90 days supply | Qty: 90 | Fill #1 | Status: AC

## 2021-07-25 ENCOUNTER — Other Ambulatory Visit: Payer: HMO

## 2021-07-25 ENCOUNTER — Other Ambulatory Visit (HOSPITAL_COMMUNITY): Payer: Self-pay

## 2021-07-25 MED ORDER — SPIRONOLACTONE 25 MG PO TABS
25.0000 mg | ORAL_TABLET | Freq: Every day | ORAL | 1 refills | Status: DC
  Filled 2021-07-25: qty 30, 30d supply, fill #0
  Filled 2021-08-23: qty 30, 30d supply, fill #1

## 2021-07-28 ENCOUNTER — Ambulatory Visit (INDEPENDENT_AMBULATORY_CARE_PROVIDER_SITE_OTHER): Payer: HMO

## 2021-07-28 DIAGNOSIS — L84 Corns and callosities: Secondary | ICD-10-CM

## 2021-07-28 DIAGNOSIS — L97521 Non-pressure chronic ulcer of other part of left foot limited to breakdown of skin: Secondary | ICD-10-CM

## 2021-07-28 DIAGNOSIS — E1165 Type 2 diabetes mellitus with hyperglycemia: Secondary | ICD-10-CM

## 2021-07-28 NOTE — Progress Notes (Signed)
SITUATION ?Reason for Visit: Fitting of Diabetic [place] ?Patient / Caregiver Report:  Patient is satisfied with fit and function of shoes and insoles. ? ?OBJECTIVE DATA: ?Patient History / Diagnosis:   ?  ICD-10-CM   ?1. Type 2 diabetes mellitus with hyperglycemia, without long-term current use of insulin (HCC)  E11.65   ?  ?2. Ulcer of left foot, limited to breakdown of skin ([place])  L97.521   ?  ?3. Foot callus  L84   ?  ? ? ?Change in Status:   None ? ?ACTIONS PERFORMED: ?In-Person Delivery, patient was fit with: ?- 1x pair [id number] PDAC approved prefabricated Diabetic Shoes: [employer] [address] [place] 10.5W ?- 3x pair [id number] PDAC approved vacuum formed custom diabetic insoles; [employer]: [id number] ? ?Shoes and insoles were verified for structural integrity and safety. Patient wore shoes and insoles in office. Skin was inspected and free of areas of concern after wearing shoes and inserts. Shoes and inserts fit properly. Patient / Caregiver provided with ferbal instruction and demonstration regarding donning, doffing, wear, care, proper fit, function, purpose, cleaning, and use of shoes and insoles ' and in all related precautions and risks and benefits regarding shoes and insoles. Patient / Caregiver was instructed to wear properly fitting socks with shoes at all times. Patient was also provided with verbal instruction regarding how to report any failures or malfunctions of shoes or inserts, and necessary follow up care. Patient / Caregiver was also instructed to contact physician regarding change in status that may affect function of shoes and inserts.  ? ?Patient / Caregiver verbalized undersatnding of instruction provided. Patient / Caregiver demonstrated independence with proper donning and doffing of shoes and inserts. ? ?PLAN ?Patient to follow with treating physician as recommended. Plan of care was discussed with and agreed upon by patient and/or caregiver. All questions were answered and concerns  addressed. ? ?

## 2021-07-31 ENCOUNTER — Ambulatory Visit: Payer: HMO | Admitting: Pulmonary Disease

## 2021-08-01 ENCOUNTER — Ambulatory Visit (INDEPENDENT_AMBULATORY_CARE_PROVIDER_SITE_OTHER): Payer: HMO | Admitting: Pulmonary Disease

## 2021-08-01 DIAGNOSIS — G4733 Obstructive sleep apnea (adult) (pediatric): Secondary | ICD-10-CM

## 2021-08-01 DIAGNOSIS — R0602 Shortness of breath: Secondary | ICD-10-CM

## 2021-08-01 LAB — PULMONARY FUNCTION TEST
DL/VA % pred: 101 %
DL/VA: 4.02 ml/min/mmHg/L
DLCO cor % pred: 79 %
DLCO cor: 19.89 ml/min/mmHg
DLCO unc % pred: 79 %
DLCO unc: 19.89 ml/min/mmHg
FEF 25-75 Post: 1.82 L/sec
FEF 25-75 Pre: 1.12 L/sec
FEF2575-%Change-Post: 61 %
FEF2575-%Pred-Post: 83 %
FEF2575-%Pred-Pre: 51 %
FEV1-%Change-Post: 13 %
FEV1-%Pred-Post: 60 %
FEV1-%Pred-Pre: 53 %
FEV1-Post: 1.83 L
FEV1-Pre: 1.62 L
FEV1FVC-%Change-Post: 3 %
FEV1FVC-%Pred-Pre: 100 %
FEV6-%Change-Post: 9 %
FEV6-%Pred-Post: 61 %
FEV6-%Pred-Pre: 56 %
FEV6-Post: 2.42 L
FEV6-Pre: 2.21 L
FEV6FVC-%Pred-Post: 106 %
FEV6FVC-%Pred-Pre: 106 %
FVC-%Change-Post: 9 %
FVC-%Pred-Post: 57 %
FVC-%Pred-Pre: 52 %
FVC-Post: 2.42 L
FVC-Pre: 2.21 L
Post FEV1/FVC ratio: 76 %
Post FEV6/FVC ratio: 100 %
Pre FEV1/FVC ratio: 73 %
Pre FEV6/FVC Ratio: 100 %
RV % pred: 102 %
RV: 2.65 L
TLC % pred: 70 %
TLC: 4.98 L

## 2021-08-01 NOTE — Patient Instructions (Signed)
Full PFT performed today. °

## 2021-08-01 NOTE — Progress Notes (Signed)
Full PFT performed today. °

## 2021-08-05 ENCOUNTER — Ambulatory Visit
Admission: RE | Admit: 2021-08-05 | Discharge: 2021-08-05 | Disposition: A | Discharge status: Home / Self Care | Payer: HMO | Source: Ambulatory Visit | Attending: Pain Medicine | Admitting: Pain Medicine

## 2021-08-05 DIAGNOSIS — M25562 Pain in left knee: Secondary | ICD-10-CM | POA: Diagnosis not present

## 2021-08-05 DIAGNOSIS — M25762 Osteophyte, left knee: Secondary | ICD-10-CM | POA: Diagnosis not present

## 2021-08-05 DIAGNOSIS — G8929 Other chronic pain: Secondary | ICD-10-CM | POA: Diagnosis not present

## 2021-08-05 DIAGNOSIS — M1712 Unilateral primary osteoarthritis, left knee: Secondary | ICD-10-CM | POA: Diagnosis not present

## 2021-08-05 MED ORDER — IOHEXOL 300 MG/ML  SOLN: 75.0000 mL | Freq: Once | INTRAMUSCULAR | Status: DC | PRN

## 2021-08-06 ENCOUNTER — Ambulatory Visit (INDEPENDENT_AMBULATORY_CARE_PROVIDER_SITE_OTHER): Payer: HMO | Admitting: Podiatry

## 2021-08-06 ENCOUNTER — Encounter: Payer: Self-pay | Admitting: Podiatry

## 2021-08-06 DIAGNOSIS — D2371 Other benign neoplasm of skin of right lower limb, including hip: Secondary | ICD-10-CM

## 2021-08-06 DIAGNOSIS — E1165 Type 2 diabetes mellitus with hyperglycemia: Secondary | ICD-10-CM | POA: Diagnosis not present

## 2021-08-06 DIAGNOSIS — D2372 Other benign neoplasm of skin of left lower limb, including hip: Secondary | ICD-10-CM

## 2021-08-06 DIAGNOSIS — M79676 Pain in unspecified toe(s): Secondary | ICD-10-CM | POA: Diagnosis not present

## 2021-08-06 DIAGNOSIS — B351 Tinea unguium: Secondary | ICD-10-CM | POA: Diagnosis not present

## 2021-08-06 NOTE — Progress Notes (Signed)
He presents today for chief complaint of painful elongated toenails. ? ?Objective: Toenails are long thick yellow dystrophic-like mycotic painful on palpation.  Third toe of the left foot is gone on to heal up where he had a paronychia last time he was him. ? ?Assessment: Resolving paronychia third digit left foot.  Pain in limb secondary onychomycosis. ? ?Plan: Debridement of toenails 1 through 5 bilateral. ?

## 2021-08-23 ENCOUNTER — Other Ambulatory Visit (HOSPITAL_COMMUNITY): Payer: Self-pay

## 2021-08-25 ENCOUNTER — Other Ambulatory Visit (HOSPITAL_COMMUNITY): Payer: Self-pay

## 2021-08-25 ENCOUNTER — Ambulatory Visit: Payer: HMO | Attending: Pain Medicine | Admitting: Pain Medicine

## 2021-08-25 DIAGNOSIS — G894 Chronic pain syndrome: Secondary | ICD-10-CM | POA: Diagnosis not present

## 2021-08-25 DIAGNOSIS — M1712 Unilateral primary osteoarthritis, left knee: Secondary | ICD-10-CM

## 2021-08-25 DIAGNOSIS — M25562 Pain in left knee: Secondary | ICD-10-CM

## 2021-08-25 DIAGNOSIS — G8929 Other chronic pain: Secondary | ICD-10-CM

## 2021-08-25 MED ORDER — ATORVASTATIN CALCIUM 40 MG PO TABS
ORAL_TABLET | ORAL | 1 refills | Status: DC
  Filled 2021-08-25: qty 90, 90d supply, fill #0
  Filled 2021-12-07: qty 90, 90d supply, fill #1
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
## 2021-08-27 ENCOUNTER — Ambulatory Visit: Payer: HMO | Admitting: Pulmonary Disease

## 2021-08-27 ENCOUNTER — Encounter: Payer: Self-pay | Admitting: Pulmonary Disease

## 2021-08-27 VITALS — BP 144/70 | HR 59 | Temp 98.1°F | Ht 70.0 in | Wt 235.4 lb

## 2021-08-27 DIAGNOSIS — G4733 Obstructive sleep apnea (adult) (pediatric): Secondary | ICD-10-CM | POA: Diagnosis not present

## 2021-08-27 DIAGNOSIS — G4734 Idiopathic sleep related nonobstructive alveolar hypoventilation: Secondary | ICD-10-CM

## 2021-08-27 DIAGNOSIS — J441 Chronic obstructive pulmonary disease with (acute) exacerbation: Secondary | ICD-10-CM | POA: Diagnosis not present

## 2021-08-27 DIAGNOSIS — J449 Chronic obstructive pulmonary disease, unspecified: Secondary | ICD-10-CM | POA: Diagnosis not present

## 2021-08-27 DIAGNOSIS — J4489 Other specified chronic obstructive pulmonary disease: Secondary | ICD-10-CM

## 2021-08-27 DIAGNOSIS — Z720 Tobacco use: Secondary | ICD-10-CM

## 2021-08-27 NOTE — Assessment & Plan Note (Signed)
Status post hypoglossal nerve implant. He is compliant and this has helped improve his daytime somnolence and fatigue.  Most importantly his night sweats have resolved. Residual AHI was noted to be 15/hour on home sleep test

## 2021-08-27 NOTE — Assessment & Plan Note (Signed)
We will reassess with nocturnal oximetry. Differential for nocturnal hypoxia includes COPD, or hypoventilation related to narcotics or residual OSA  If he has significant hypoxia, he would prefer to try bronchodilators first rather than nocturnal oxygen

## 2021-08-27 NOTE — Assessment & Plan Note (Addendum)
He has moderate airway obstruction but not severe enough to cause nocturnal hypoxia. I offered him LABA/LAMA combination but he would like to hold off.  His exercise tolerance seems to be more limited by his knees rather than dyspnea We will also refer him to lung cancer screening program

## 2021-08-27 NOTE — Patient Instructions (Addendum)
  X check ONO   If oxygen drop still present, can try breathing inhalers first before oxygen  X enroll in lung cancer screening
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
## 2021-09-04 ENCOUNTER — Telehealth: Payer: Self-pay

## 2021-09-04 ENCOUNTER — Other Ambulatory Visit: Payer: Self-pay

## 2021-09-04 DIAGNOSIS — Z720 Tobacco use: Secondary | ICD-10-CM

## 2021-09-04 DIAGNOSIS — G4733 Obstructive sleep apnea (adult) (pediatric): Secondary | ICD-10-CM

## 2021-09-04 MED ORDER — ALBUTEROL SULFATE HFA 108 (90 BASE) MCG/ACT IN AERS: 2.0000 | INHALATION_SPRAY | Freq: Four times a day (QID) | RESPIRATORY_TRACT | 6 refills | Status: DC | PRN

## 2021-09-04 NOTE — Telephone Encounter (Signed)
Tipps, Shaniqua T 20 minutes ago (1:17 PM)    Pt called in today ''stating he is wanting to go ahead and start inhaler. Pt states he would like the albuterol as this is what was discussed'' and wants to now enroll in the lung cancer screening.   Pharmacy: [facility]      Dr. Elsworth Soho please advise from PFT note on 08/01/2021 Albuterol inhaler was offered to be prescribed but patient declined and now wants inhaler. Is it still ok to send rx and to order for LCS?

## 2021-09-04 NOTE — Telephone Encounter (Signed)
Orders placed  Called and notified patient of albuterol inhaler order and LCS order.  Nothing further needed

## 2021-09-04 NOTE — Telephone Encounter (Signed)
Pt called in today ''stating he is wanting to go ahead and start inhaler. Pt states he would like the albuterol as this is what was discussed'' and wants to now enroll in the lung cancer screening.  Pharmacy: [facility]

## 2021-09-04 NOTE — Telephone Encounter (Signed)
Phone message sent to Dr. Elsworth Soho
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
## 2021-09-08 ENCOUNTER — Ambulatory Visit: Payer: HMO | Attending: Pain Medicine | Admitting: Pain Medicine

## 2021-09-08 ENCOUNTER — Encounter: Payer: Self-pay | Admitting: Pain Medicine

## 2021-09-08 VITALS — BP 141/79 | HR 54 | Temp 97.4°F | Resp 16 | Ht 70.0 in | Wt 230.0 lb

## 2021-09-08 DIAGNOSIS — G8929 Other chronic pain: Secondary | ICD-10-CM | POA: Insufficient documentation

## 2021-09-08 DIAGNOSIS — M961 Postlaminectomy syndrome, not elsewhere classified: Secondary | ICD-10-CM | POA: Diagnosis not present

## 2021-09-08 DIAGNOSIS — G894 Chronic pain syndrome: Secondary | ICD-10-CM | POA: Insufficient documentation

## 2021-09-08 DIAGNOSIS — Z79891 Long term (current) use of opiate analgesic: Secondary | ICD-10-CM | POA: Diagnosis not present

## 2021-09-08 DIAGNOSIS — M542 Cervicalgia: Secondary | ICD-10-CM | POA: Diagnosis not present

## 2021-09-08 DIAGNOSIS — M5414 Radiculopathy, thoracic region: Secondary | ICD-10-CM | POA: Diagnosis not present

## 2021-09-08 DIAGNOSIS — M25559 Pain in unspecified hip: Secondary | ICD-10-CM | POA: Insufficient documentation

## 2021-09-08 DIAGNOSIS — Z79899 Other long term (current) drug therapy: Secondary | ICD-10-CM | POA: Insufficient documentation

## 2021-09-08 DIAGNOSIS — M545 Low back pain, unspecified: Secondary | ICD-10-CM | POA: Insufficient documentation

## 2021-09-08 DIAGNOSIS — M79601 Pain in right arm: Secondary | ICD-10-CM | POA: Diagnosis not present

## 2021-09-08 DIAGNOSIS — M79602 Pain in left arm: Secondary | ICD-10-CM | POA: Diagnosis not present

## 2021-09-08 MED ORDER — MORPHINE SULFATE ER 15 MG PO TBCR: 15.0000 mg | EXTENDED_RELEASE_TABLET | Freq: Two times a day (BID) | ORAL | 0 refills | Status: DC

## 2021-09-08 NOTE — Progress Notes (Signed)
Nursing Pain Medication Assessment:  Safety precautions to be maintained throughout the outpatient stay will include: orient to surroundings, keep bed in low position, maintain call bell within reach at all times, provide assistance with transfer out of bed and ambulation.  Medication Inspection Compliance: Pill count conducted under aseptic conditions, in front of the patient. Neither the pills nor the bottle was removed from the patient's sight at any time. Once count was completed pills were immediately returned to the patient in their original bottle.  Medication: Morphine ER (MSContin) Pill/Patch Count:  19 of 60 pills remain Pill/Patch Appearance: Markings consistent with prescribed medication Bottle Appearance: Standard pharmacy container. Clearly labeled. Filled Date: 05 / 31 / 2023 Last Medication intake:  Today

## 2021-09-08 NOTE — Patient Instructions (Signed)

## 2021-09-11 LAB — TOXASSURE SELECT 13 (MW), URINE

## 2021-09-12 DIAGNOSIS — Z4420 Encounter for fitting and adjustment of artificial eye, unspecified: Secondary | ICD-10-CM | POA: Diagnosis not present

## 2021-09-17 DIAGNOSIS — G4733 Obstructive sleep apnea (adult) (pediatric): Secondary | ICD-10-CM | POA: Diagnosis not present

## 2021-09-25 DIAGNOSIS — Z0000 Encounter for general adult medical examination without abnormal findings: Secondary | ICD-10-CM | POA: Diagnosis not present

## 2021-09-25 DIAGNOSIS — E1165 Type 2 diabetes mellitus with hyperglycemia: Secondary | ICD-10-CM | POA: Diagnosis not present

## 2021-09-25 DIAGNOSIS — I10 Essential (primary) hypertension: Secondary | ICD-10-CM | POA: Diagnosis not present

## 2021-09-30 ENCOUNTER — Telehealth: Payer: Self-pay | Admitting: Pulmonary Disease

## 2021-10-02 DIAGNOSIS — E1165 Type 2 diabetes mellitus with hyperglycemia: Secondary | ICD-10-CM | POA: Diagnosis not present

## 2021-10-02 DIAGNOSIS — Z970 Presence of artificial eye: Secondary | ICD-10-CM | POA: Diagnosis not present

## 2021-10-02 DIAGNOSIS — J438 Other emphysema: Secondary | ICD-10-CM | POA: Diagnosis not present

## 2021-10-02 DIAGNOSIS — I700 Atherosclerosis of aorta: Secondary | ICD-10-CM | POA: Diagnosis not present

## 2021-10-02 DIAGNOSIS — M5136 Other intervertebral disc degeneration, lumbar region: Secondary | ICD-10-CM | POA: Diagnosis not present

## 2021-10-02 DIAGNOSIS — Z87891 Personal history of nicotine dependence: Secondary | ICD-10-CM | POA: Diagnosis not present

## 2021-10-02 DIAGNOSIS — Z122 Encounter for screening for malignant neoplasm of respiratory organs: Secondary | ICD-10-CM | POA: Diagnosis not present

## 2021-10-02 DIAGNOSIS — Z6829 Body mass index (BMI) 29.0-29.9, adult: Secondary | ICD-10-CM | POA: Diagnosis not present

## 2021-10-02 DIAGNOSIS — I471 Supraventricular tachycardia: Secondary | ICD-10-CM | POA: Diagnosis not present

## 2021-10-02 DIAGNOSIS — I10 Essential (primary) hypertension: Secondary | ICD-10-CM | POA: Diagnosis not present

## 2021-10-02 DIAGNOSIS — M545 Low back pain, unspecified: Secondary | ICD-10-CM | POA: Diagnosis not present

## 2021-10-02 DIAGNOSIS — I2510 Atherosclerotic heart disease of native coronary artery without angina pectoris: Secondary | ICD-10-CM | POA: Diagnosis not present

## 2021-10-04 NOTE — Telephone Encounter (Signed)
ONO on room air showed significant desaturation for about 6 hours less than 88%. As per our discussion , we can prescribe him Anoro to take once daily and see if this improves his COPD and his oxygen levels. He will qualify for nocturnal oxygen If he is agreeable please prescribe 2 L oxygen during sleep

## 2021-10-06 ENCOUNTER — Telehealth: Payer: Self-pay | Admitting: Pulmonary Disease

## 2021-10-06 DIAGNOSIS — G4733 Obstructive sleep apnea (adult) (pediatric): Secondary | ICD-10-CM | POA: Diagnosis not present

## 2021-10-06 DIAGNOSIS — I10 Essential (primary) hypertension: Secondary | ICD-10-CM | POA: Diagnosis not present

## 2021-10-06 DIAGNOSIS — G4734 Idiopathic sleep related nonobstructive alveolar hypoventilation: Secondary | ICD-10-CM | POA: Diagnosis not present

## 2021-10-06 DIAGNOSIS — I2510 Atherosclerotic heart disease of native coronary artery without angina pectoris: Secondary | ICD-10-CM | POA: Diagnosis not present

## 2021-10-06 DIAGNOSIS — E119 Type 2 diabetes mellitus without complications: Secondary | ICD-10-CM | POA: Diagnosis not present

## 2021-10-06 DIAGNOSIS — E669 Obesity, unspecified: Secondary | ICD-10-CM | POA: Diagnosis not present

## 2021-10-06 NOTE — Telephone Encounter (Signed)
Called patient but he did not answer. His VM is not setup as well. Will attempt to call back later.

## 2021-10-06 NOTE — Telephone Encounter (Signed)
Attempted to call [place] at [place] but unable to reach. Unable to leave message at clinic. Will try to call back later.

## 2021-10-07 NOTE — Telephone Encounter (Signed)
Called pt and there was no answer, LMTCB.

## 2021-10-07 NOTE — Telephone Encounter (Signed)
ONO on room air showed significant desaturation for about 6 hours less than 88%. As per our discussion , we can prescribe him Anoro to take once daily and see if this improves his COPD and his oxygen levels. He will qualify for nocturnal oxygen If he is agreeable please prescribe 2 L oxygen during sleep        Electronically signed by Rigoberto Noel, MD at 10/04/2021  1:33 AM  Spoke with pt and notified of results per Dr.Alva Pt verbalized understanding and denied any questions.  He was agreeable to o2 order and this was placed. I have faxed results of ONO to [facility] ok per pt. I mailed him a copy of results- verified home address.

## 2021-10-07 NOTE — Telephone Encounter (Signed)
I do not see ONO  Not scanned in yet  Send community msg to brad to fax to b pod office  Then will call eagle back

## 2021-10-08 NOTE — Telephone Encounter (Signed)
Called and spoke with patient, provided results/recommendations per Dr. Elsworth Soho.  He stated that someone had called him yesterday with the same results and he told them who to use for his DME and they said they would have it set up.  Apologized for duplication of call and he said he would rather receive 2 calls than no call at all.  Order was placed yesterday by Magda Paganini.  Nothing further needed.  Closing encounter.

## 2021-10-09 ENCOUNTER — Telehealth: Payer: Self-pay | Admitting: Pulmonary Disease

## 2021-10-09 MED ORDER — ANORO ELLIPTA 62.5-25 MCG/ACT IN AEPB
1.0000 | INHALATION_SPRAY | Freq: Every day | RESPIRATORY_TRACT | 6 refills | Status: AC
Start: 2021-10-09 — End: ?

## 2021-10-09 NOTE — Telephone Encounter (Signed)
Called and spoke to patient and notified him that I sent inhaler in for him today and apologized for the inconvenience. He voiced understanding. Nothing further needed at this time.

## 2021-10-14 ENCOUNTER — Other Ambulatory Visit: Payer: Self-pay | Admitting: Urology

## 2021-10-14 ENCOUNTER — Other Ambulatory Visit: Payer: Self-pay | Admitting: Cardiovascular Disease

## 2021-10-14 ENCOUNTER — Other Ambulatory Visit (HOSPITAL_COMMUNITY): Payer: Self-pay

## 2021-10-14 ENCOUNTER — Telehealth: Payer: Self-pay | Admitting: Cardiovascular Disease

## 2021-10-14 DIAGNOSIS — R3915 Urgency of urination: Secondary | ICD-10-CM

## 2021-10-14 DIAGNOSIS — N3281 Overactive bladder: Secondary | ICD-10-CM

## 2021-10-14 MED ORDER — OMEPRAZOLE 40 MG PO CPDR
DELAYED_RELEASE_CAPSULE | ORAL | 3 refills | Status: DC
Start: 2021-10-14 — End: 2023-02-16
  Filled 2021-10-14: qty 90, 90d supply, fill #0
  Filled 2022-01-08: qty 90, 90d supply, fill #1
  Filled 2022-05-01: qty 90, 90d supply, fill #2
  Filled 2022-09-12: qty 90, 90d supply, fill #3

## 2021-10-14 MED ORDER — LOSARTAN POTASSIUM 100 MG PO TABS
100.0000 mg | ORAL_TABLET | Freq: Every day | ORAL | 0 refills | Status: DC
  Filled 2021-10-14: qty 90, 90d supply, fill #0

## 2021-10-14 NOTE — Telephone Encounter (Signed)
*  STAT* If patient is at the pharmacy, call can be transferred to refill team.   1. Which medications need to be refilled? (please list name of each medication and dose if known) spironolactone (ALDACTONE) 25 MG tablet  2. Which pharmacy/location (including street and city if local pharmacy) is medication to be sent to? [facility]  3. Do they need a 30 day or 90 day supply? [place]

## 2021-10-15 ENCOUNTER — Other Ambulatory Visit (HOSPITAL_COMMUNITY): Payer: Self-pay

## 2021-10-15 MED ORDER — SPIRONOLACTONE 25 MG PO TABS: 25.0000 mg | ORAL_TABLET | Freq: Every day | ORAL | 1 refills | Status: DC

## 2021-10-16 ENCOUNTER — Encounter: Payer: Self-pay | Admitting: Urology

## 2021-10-16 ENCOUNTER — Ambulatory Visit (INDEPENDENT_AMBULATORY_CARE_PROVIDER_SITE_OTHER): Payer: PPO | Admitting: Urology

## 2021-10-16 ENCOUNTER — Other Ambulatory Visit (HOSPITAL_COMMUNITY): Payer: Self-pay

## 2021-10-16 VITALS — BP 133/76 | HR 55 | Ht 70.0 in | Wt 235.0 lb

## 2021-10-16 DIAGNOSIS — N3281 Overactive bladder: Secondary | ICD-10-CM | POA: Diagnosis not present

## 2021-10-16 DIAGNOSIS — R3915 Urgency of urination: Secondary | ICD-10-CM | POA: Diagnosis not present

## 2021-10-16 LAB — BLADDER SCAN AMB NON-IMAGING

## 2021-10-16 MED ORDER — MIRABEGRON ER 50 MG PO TB24
50.0000 mg | ORAL_TABLET | Freq: Every day | ORAL | 3 refills | Status: DC
  Filled 2021-10-16: qty 30, 30d supply, fill #0
  Filled 2021-10-16: qty 90, 90d supply, fill #0
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
## 2021-10-22 ENCOUNTER — Other Ambulatory Visit: Payer: Self-pay

## 2021-10-22 DIAGNOSIS — Z122 Encounter for screening for malignant neoplasm of respiratory organs: Secondary | ICD-10-CM

## 2021-10-22 DIAGNOSIS — Z87891 Personal history of nicotine dependence: Secondary | ICD-10-CM

## 2021-10-25 ENCOUNTER — Other Ambulatory Visit (HOSPITAL_COMMUNITY): Payer: Self-pay

## 2021-10-27 ENCOUNTER — Other Ambulatory Visit (HOSPITAL_COMMUNITY): Payer: Self-pay

## 2021-10-27 MED ORDER — GLIPIZIDE ER 10 MG PO TB24
ORAL_TABLET | ORAL | 3 refills | Status: DC
  Filled 2021-10-27: qty 90, 90d supply, fill #0
  Filled 2022-02-09: qty 90, 90d supply, fill #1
  Filled 2022-05-17: qty 90, 90d supply, fill #2
  Filled 2022-07-31: qty 90, 90d supply, fill #3

## 2021-10-29 DIAGNOSIS — D2262 Melanocytic nevi of left upper limb, including shoulder: Secondary | ICD-10-CM | POA: Diagnosis not present

## 2021-10-29 DIAGNOSIS — D045 Carcinoma in situ of skin of trunk: Secondary | ICD-10-CM | POA: Diagnosis not present

## 2021-10-29 DIAGNOSIS — Z85828 Personal history of other malignant neoplasm of skin: Secondary | ICD-10-CM | POA: Diagnosis not present

## 2021-10-29 DIAGNOSIS — L280 Lichen simplex chronicus: Secondary | ICD-10-CM | POA: Diagnosis not present

## 2021-10-29 DIAGNOSIS — D2261 Melanocytic nevi of right upper limb, including shoulder: Secondary | ICD-10-CM | POA: Diagnosis not present

## 2021-10-29 DIAGNOSIS — D485 Neoplasm of uncertain behavior of skin: Secondary | ICD-10-CM | POA: Diagnosis not present

## 2021-10-29 DIAGNOSIS — L570 Actinic keratosis: Secondary | ICD-10-CM | POA: Diagnosis not present

## 2021-10-29 DIAGNOSIS — L304 Erythema intertrigo: Secondary | ICD-10-CM | POA: Diagnosis not present

## 2021-11-05 ENCOUNTER — Ambulatory Visit: Payer: HMO | Admitting: Podiatry

## 2021-11-05 ENCOUNTER — Ambulatory Visit
Admission: RE | Admit: 2021-11-05 | Discharge: 2021-11-05 | Disposition: A | Discharge status: Home / Self Care | Payer: HMO | Source: Ambulatory Visit | Attending: Internal Medicine | Admitting: Internal Medicine

## 2021-11-05 DIAGNOSIS — Z122 Encounter for screening for malignant neoplasm of respiratory organs: Secondary | ICD-10-CM | POA: Insufficient documentation

## 2021-11-05 DIAGNOSIS — Z87891 Personal history of nicotine dependence: Secondary | ICD-10-CM | POA: Insufficient documentation

## 2021-11-06 ENCOUNTER — Telehealth: Payer: Self-pay | Admitting: Acute Care

## 2021-11-06 NOTE — Telephone Encounter (Signed)
Call report received on LCS CT preformed.   CLINICAL DATA:  76 year old asymptomatic male former smoker with 70.5 pack-year smoking history, quit smoking in 2015.   EXAM: CT CHEST WITHOUT CONTRAST LOW-DOSE FOR LUNG CANCER SCREENING   TECHNIQUE: Multidetector CT imaging of the chest was performed following the standard protocol without IV contrast.   RADIATION DOSE REDUCTION: This exam was performed according to the departmental dose-optimization program which includes automated exposure control, adjustment of the mA and/or kV according to patient size and/or use of iterative reconstruction technique.   COMPARISON:  10/18/2020 screening chest CT.   FINDINGS: Cardiovascular: Normal heart size. No significant pericardial effusion/thickening. Three-vessel coronary atherosclerosis. Atherosclerotic nonaneurysmal thoracic aorta. Normal caliber pulmonary arteries.   Mediastinum/Nodes: No discrete thyroid nodules. Unremarkable esophagus. No pathologically enlarged axillary, mediastinal or hilar lymph nodes, noting limited sensitivity for the detection of hilar adenopathy on this noncontrast study.   Lungs/Pleura: No pneumothorax. No pleural effusion. Mild centrilobular emphysema with diffuse bronchial wall thickening. Previously visualized non solid left upper lobe nodule is absent on today's scan. New irregular thick bandlike consolidation in the peripheral basilar right upper lobe with adjacent new nodular focus of consolidation measuring 7.7 mm in volume derived mean diameter (series 3/image 124). No additional new significant pulmonary nodules.   Upper abdomen: Cholecystectomy.   Musculoskeletal: No aggressive appearing focal osseous lesions. Marked thoracic spondylosis. Partially visualized surgical hardware from ACDF. Partially visualized bilateral posterior spinal fusion hardware in the lumbar spine. Ventral upper right chest stimulator device with subcutaneous leads  coursing superiorly in the right neck.   IMPRESSION: 1. Lung-RADS 4A, suspicious. Follow up low-dose chest CT without contrast in 3 months (please use the following order, "CT CHEST LCS NODULE FOLLOW-UP W/O CM") is recommended. New irregular thick bandlike consolidation in the peripheral basilar right upper lobe with adjacent new nodular focus of consolidation measuring 7.7 mm in volume derived mean diameter. 2. Three-vessel coronary atherosclerosis. 3. Aortic Atherosclerosis (ICD10-I70.0) and Emphysema (ICD10-J43.9).   These results will be called to the ordering clinician or representative by the Radiologist Assistant, and communication documented in the PACS or [employer].    Sarah please advise.

## 2021-11-07 ENCOUNTER — Other Ambulatory Visit: Payer: Self-pay | Admitting: Cardiovascular Disease

## 2021-11-07 ENCOUNTER — Other Ambulatory Visit: Payer: Self-pay

## 2021-11-07 ENCOUNTER — Other Ambulatory Visit (HOSPITAL_COMMUNITY): Payer: Self-pay

## 2021-11-07 DIAGNOSIS — Z87891 Personal history of nicotine dependence: Secondary | ICD-10-CM

## 2021-11-07 DIAGNOSIS — R911 Solitary pulmonary nodule: Secondary | ICD-10-CM

## 2021-11-07 MED ORDER — CARVEDILOL 6.25 MG PO TABS
ORAL_TABLET | ORAL | 0 refills | Status: DC
  Filled 2021-11-07: qty 90, 45d supply, fill #0

## 2021-11-07 NOTE — Telephone Encounter (Signed)
I have called the patient with the results of his low-dose CT.  I explained that his scan was read as a lung RADS 4A, suspicious.  I explained that while there was a small nodule that had been noted on his previous scan that had totally resolved there is a new irregular thick bandlike consolidation in his right upper lobe measuring 7.7 mm that we need to follow-up on in 3 months with an additional scan.  He is in agreement with this plan.  I told him he would get a call closer to the time to get the scan scheduled.  Scan will be due 02/09/2022.  I have also explained that there was notation of three-vessel coronary atherosclerosis on his scan.  He is on statin therapy per his primary care provider.  We will make sure his PCP gets a copy of the results so that he can follow-up as he feels is clinically indicated. There was also notation of mild emphysema, and aortic atherosclerosis both of which I notified the patient about.  He verbalized understanding of the above and had no further questions at completion of the call.  Denise please fax results to PCP and let him know plan is for a 17-month follow-up low-dose CT to reevaluate the nodule of concern in this short interval. Please place order for 65-month follow-up low-dose screening CT.  Thanks so much

## 2021-11-07 NOTE — Telephone Encounter (Signed)
Results and plan faxed to Pcp and new order placed for 3 month LCS nodule follow up CT chest

## 2021-11-17 ENCOUNTER — Telehealth: Payer: Self-pay | Admitting: Pain Medicine

## 2021-11-17 NOTE — Telephone Encounter (Signed)
Patient is having dental surgery tomorrow. He needs a form sent to Dr. Lewanda Rife at  saying it is ok to prescribe medication for this procedure.  Phone [phone]  Fax [phone]

## 2021-11-17 NOTE — Telephone Encounter (Signed)
Surgeons letter faxed to Dr Emogene Morgan office  [phone]

## 2021-11-18 ENCOUNTER — Ambulatory Visit: Payer: HMO | Attending: Cardiovascular Disease | Admitting: Cardiovascular Disease

## 2021-11-18 ENCOUNTER — Encounter: Payer: Self-pay | Admitting: Cardiovascular Disease

## 2021-11-18 ENCOUNTER — Ambulatory Visit (INDEPENDENT_AMBULATORY_CARE_PROVIDER_SITE_OTHER): Payer: HMO

## 2021-11-18 VITALS — BP 112/72 | HR 63 | Ht 70.0 in | Wt 233.4 lb

## 2021-11-18 DIAGNOSIS — R42 Dizziness and giddiness: Secondary | ICD-10-CM

## 2021-11-18 DIAGNOSIS — I10 Essential (primary) hypertension: Secondary | ICD-10-CM

## 2021-11-18 DIAGNOSIS — I471 Supraventricular tachycardia: Secondary | ICD-10-CM | POA: Diagnosis not present

## 2021-11-18 DIAGNOSIS — I2510 Atherosclerotic heart disease of native coronary artery without angina pectoris: Secondary | ICD-10-CM

## 2021-11-18 DIAGNOSIS — I739 Peripheral vascular disease, unspecified: Secondary | ICD-10-CM | POA: Diagnosis not present

## 2021-11-18 DIAGNOSIS — E785 Hyperlipidemia, unspecified: Secondary | ICD-10-CM

## 2021-11-18 MED ORDER — CARVEDILOL 6.25 MG PO TABS: ORAL_TABLET | ORAL | 3 refills | Status: DC

## 2021-11-18 MED ORDER — SPIRONOLACTONE 25 MG PO TABS: 25.0000 mg | ORAL_TABLET | Freq: Every day | ORAL | 3 refills | Status: DC
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
## 2021-11-18 NOTE — Patient Instructions (Signed)
Medication Instructions:  NONE *If you need a refill on your cardiac medications before your next appointment, please call your pharmacy*   Lab Work: NONE If you have labs (blood work) drawn today and your tests are completely normal, you will receive your results only by: [place] (if you have MyChart) OR A paper copy in the mail If you have any lab test that is abnormal or we need to change your treatment, we will call you to review the results.   Testing/Procedures:  Your physician has recommended that you wear a Zio monitor.   This monitor is a medical device that records the heart's electrical activity. Doctors most often use these monitors to diagnose arrhythmias. Arrhythmias are problems with the speed or rhythm of the heartbeat. The monitor is a small device applied to your chest. You can wear one while you do your normal daily activities. While wearing this monitor if you have any symptoms to push the button and record what you felt. Once you have worn this monitor for the period of time provider prescribed (Usually 14 days), you will return the monitor device in the postage paid box. Once it is returned they will download the data collected and provide [place] with a report which the provider will then review and we will call you with those results. Important tips:  Avoid showering during the first 24 hours of wearing the monitor. Avoid excessive sweating to help maximize wear time. Do not submerge the device, no hot tubs, and no swimming pools. Keep any lotions or oils away from the patch. After 24 hours you may shower with the patch on. Take brief showers with your back facing the shower head.  Do not remove patch once it has been placed because that will interrupt data and decrease adhesive wear time. Push the button when you have any symptoms and write down what you were feeling. Once you have completed wearing your monitor, remove and place into box which has postage paid  and place in your outgoing mailbox.  If for some reason you have misplaced your box then call our office and we can provide another box and/or mail it off for you.      Follow-Up: At [facility], you and your health needs are our priority.  As part of our continuing mission to provide you with exceptional heart care, we have created designated Provider Care Teams.  These Care Teams include your primary Cardiologist (physician) and Advanced Practice Providers (APPs -  Physician Assistants and Nurse Practitioners) who all work together to provide you with the care you need, when you need it.  We recommend signing up for the patient portal called "MyChart".  Sign up information is provided on this After Visit Summary.  MyChart is used to connect with patients for Virtual Visits (Telemedicine).  Patients are able to view lab/test results, encounter notes, upcoming appointments, etc.  Non-urgent messages can be sent to your provider as well.   To learn more about what you can do with MyChart, go to [email or web address].    Your next appointment:   12 month(s)  The format for your next appointment:   In Person  Provider:   You may see Kathlyn Sacramento, MD or one of the following Advanced Practice Providers on your designated Care Team:   Murray Hodgkins, NP Christell Faith, PA-C Cadence Kathlen Mody, PA-C Gerrie Nordmann, NP {   Important Information About [place]

## 2021-11-20 DIAGNOSIS — R42 Dizziness and giddiness: Secondary | ICD-10-CM | POA: Diagnosis not present

## 2021-11-25 ENCOUNTER — Telehealth: Payer: Self-pay | Admitting: Cardiovascular Disease

## 2021-11-25 NOTE — Telephone Encounter (Signed)
Pt called stating that he needs help keeping his heart monitor on. He states he got in on the first night but he was tossing and turning and put it back on with bandaids but it wont stay on

## 2021-11-25 NOTE — Telephone Encounter (Signed)
Spoke with the patient. Patient sts that he received his zio and has worn it a little over 24 hours. Pt sts that he followed the application steps precisely. Pt sts that he tends to his garden during the day and tosses and turns in his sleep. The monitor has fallen off and he has tried to reinforce it unsuccessfully.  I adv the patient that we could get another monitor ordered and sent to him. Pt declined. Pt sts that he would rather reattempt the monitor when the weather cools down. Adv the pt to go ahead and mail back the zio.  Pt denies any reoccurrence of  symptoms. Adv the patient to contact the office if symptoms reoccur. Adv the patient that I will fwd the update to Dr. Fletcher Anon and call back if he has any additional recommendations. Pt verbalized understanding and voiced appreciation for the call back.

## 2021-12-01 DIAGNOSIS — R001 Bradycardia, unspecified: Secondary | ICD-10-CM | POA: Diagnosis not present

## 2021-12-01 DIAGNOSIS — R42 Dizziness and giddiness: Secondary | ICD-10-CM | POA: Diagnosis not present
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
## 2021-12-05 DIAGNOSIS — J449 Chronic obstructive pulmonary disease, unspecified: Secondary | ICD-10-CM | POA: Diagnosis not present

## 2021-12-05 DIAGNOSIS — G4734 Idiopathic sleep related nonobstructive alveolar hypoventilation: Secondary | ICD-10-CM | POA: Diagnosis not present

## 2021-12-08 ENCOUNTER — Ambulatory Visit: Payer: HMO | Attending: Pain Medicine | Admitting: Pain Medicine

## 2021-12-08 ENCOUNTER — Other Ambulatory Visit: Payer: Self-pay

## 2021-12-08 ENCOUNTER — Other Ambulatory Visit (HOSPITAL_COMMUNITY): Payer: Self-pay

## 2021-12-08 ENCOUNTER — Encounter: Payer: Self-pay | Admitting: Pain Medicine

## 2021-12-08 VITALS — BP 155/72 | HR 59 | Temp 97.0°F | Resp 16 | Ht 70.0 in | Wt 233.0 lb

## 2021-12-08 DIAGNOSIS — Z79899 Other long term (current) drug therapy: Secondary | ICD-10-CM | POA: Diagnosis not present

## 2021-12-08 DIAGNOSIS — M79602 Pain in left arm: Secondary | ICD-10-CM | POA: Insufficient documentation

## 2021-12-08 DIAGNOSIS — R911 Solitary pulmonary nodule: Secondary | ICD-10-CM | POA: Diagnosis not present

## 2021-12-08 DIAGNOSIS — M25559 Pain in unspecified hip: Secondary | ICD-10-CM | POA: Diagnosis not present

## 2021-12-08 DIAGNOSIS — Z79891 Long term (current) use of opiate analgesic: Secondary | ICD-10-CM | POA: Diagnosis not present

## 2021-12-08 DIAGNOSIS — M542 Cervicalgia: Secondary | ICD-10-CM | POA: Insufficient documentation

## 2021-12-08 DIAGNOSIS — G8929 Other chronic pain: Secondary | ICD-10-CM | POA: Diagnosis not present

## 2021-12-08 DIAGNOSIS — M79601 Pain in right arm: Secondary | ICD-10-CM | POA: Insufficient documentation

## 2021-12-08 DIAGNOSIS — M545 Low back pain, unspecified: Secondary | ICD-10-CM | POA: Diagnosis not present

## 2021-12-08 DIAGNOSIS — M5414 Radiculopathy, thoracic region: Secondary | ICD-10-CM | POA: Insufficient documentation

## 2021-12-08 DIAGNOSIS — M961 Postlaminectomy syndrome, not elsewhere classified: Secondary | ICD-10-CM | POA: Insufficient documentation

## 2021-12-08 DIAGNOSIS — Z87891 Personal history of nicotine dependence: Secondary | ICD-10-CM | POA: Diagnosis not present

## 2021-12-08 DIAGNOSIS — M25551 Pain in right hip: Secondary | ICD-10-CM | POA: Insufficient documentation

## 2021-12-08 DIAGNOSIS — G894 Chronic pain syndrome: Secondary | ICD-10-CM | POA: Diagnosis not present

## 2021-12-08 MED ORDER — MORPHINE SULFATE ER 15 MG PO TBCR: 15.0000 mg | EXTENDED_RELEASE_TABLET | Freq: Two times a day (BID) | ORAL | 0 refills | Status: DC

## 2021-12-08 NOTE — Progress Notes (Signed)
Nursing Pain Medication Assessment:  Safety precautions to be maintained throughout the outpatient stay will include: orient to surroundings, keep bed in low position, maintain call bell within reach at all times, provide assistance with transfer out of bed and ambulation.  Medication Inspection Compliance: Pill count conducted under aseptic conditions, in front of the patient. Neither the pills nor the bottle was removed from the patient's sight at any time. Once count was completed pills were immediately returned to the patient in their original bottle.  Medication: Morphine ER (MSContin) Pill/Patch Count:  18 of 60 pills remain Pill/Patch Appearance: Markings consistent with prescribed medication Bottle Appearance: Standard pharmacy container. Clearly labeled. Filled Date: 08 / 29 / 2023 Last Medication intake:  Today

## 2021-12-10 ENCOUNTER — Other Ambulatory Visit (HOSPITAL_COMMUNITY): Payer: Self-pay

## 2021-12-10 ENCOUNTER — Ambulatory Visit (INDEPENDENT_AMBULATORY_CARE_PROVIDER_SITE_OTHER): Payer: HMO | Admitting: Podiatry

## 2021-12-10 DIAGNOSIS — E1165 Type 2 diabetes mellitus with hyperglycemia: Secondary | ICD-10-CM | POA: Diagnosis not present

## 2021-12-10 DIAGNOSIS — M79676 Pain in unspecified toe(s): Secondary | ICD-10-CM

## 2021-12-10 DIAGNOSIS — B351 Tinea unguium: Secondary | ICD-10-CM | POA: Diagnosis not present

## 2021-12-10 NOTE — Progress Notes (Signed)
He presents today for follow-up of his some bilateral painful elongated nails and calluses.  Objective: Vital signs are stable he is alert oriented x3 no open lesions or wounds pulses are palpable and strong bilateral.  Toenails are dystrophic and ingrown with some tenderness.  Multiple benign skin lesions plantar aspect of bilateral foot.  Assessment: Diabetes type 2 without long-term current insulin use and onychomycosis with benign skin lesions.  Plan: Debridement of toenails and benign skin lesions bilateral.  Follow-up with him after the first of the year

## 2021-12-11 ENCOUNTER — Other Ambulatory Visit (HOSPITAL_COMMUNITY): Payer: Self-pay

## 2021-12-16 DIAGNOSIS — M13862 Other specified arthritis, left knee: Secondary | ICD-10-CM | POA: Diagnosis not present

## 2021-12-16 DIAGNOSIS — M25562 Pain in left knee: Secondary | ICD-10-CM | POA: Diagnosis not present

## 2021-12-16 DIAGNOSIS — M1712 Unilateral primary osteoarthritis, left knee: Secondary | ICD-10-CM | POA: Insufficient documentation

## 2021-12-18 ENCOUNTER — Other Ambulatory Visit (HOSPITAL_COMMUNITY): Payer: Self-pay

## 2021-12-22 ENCOUNTER — Other Ambulatory Visit (HOSPITAL_COMMUNITY): Payer: Self-pay

## 2021-12-23 ENCOUNTER — Other Ambulatory Visit (HOSPITAL_COMMUNITY): Payer: Self-pay

## 2021-12-24 ENCOUNTER — Ambulatory Visit: Payer: HMO

## 2021-12-24 ENCOUNTER — Other Ambulatory Visit (HOSPITAL_COMMUNITY): Payer: Self-pay

## 2021-12-24 ENCOUNTER — Ambulatory Visit
Admission: RE | Admit: 2021-12-24 | Discharge: 2021-12-24 | Disposition: A | Discharge status: Home / Self Care | Payer: HMO | Source: Ambulatory Visit | Attending: Pain Medicine | Admitting: Pain Medicine

## 2021-12-24 VITALS — Wt 228.0 lb

## 2021-12-24 DIAGNOSIS — Z87891 Personal history of nicotine dependence: Secondary | ICD-10-CM | POA: Insufficient documentation

## 2021-12-24 DIAGNOSIS — M25512 Pain in left shoulder: Secondary | ICD-10-CM | POA: Diagnosis not present

## 2021-12-24 DIAGNOSIS — R918 Other nonspecific abnormal finding of lung field: Secondary | ICD-10-CM | POA: Diagnosis not present

## 2021-12-24 DIAGNOSIS — I700 Atherosclerosis of aorta: Secondary | ICD-10-CM | POA: Diagnosis not present

## 2021-12-24 DIAGNOSIS — M549 Dorsalgia, unspecified: Secondary | ICD-10-CM | POA: Insufficient documentation

## 2021-12-24 DIAGNOSIS — E119 Type 2 diabetes mellitus without complications: Secondary | ICD-10-CM | POA: Insufficient documentation

## 2021-12-24 DIAGNOSIS — J432 Centrilobular emphysema: Secondary | ICD-10-CM | POA: Diagnosis not present

## 2021-12-24 DIAGNOSIS — M25551 Pain in right hip: Secondary | ICD-10-CM | POA: Insufficient documentation

## 2021-12-24 DIAGNOSIS — R911 Solitary pulmonary nodule: Secondary | ICD-10-CM | POA: Diagnosis not present

## 2021-12-24 DIAGNOSIS — G8929 Other chronic pain: Secondary | ICD-10-CM | POA: Diagnosis not present

## 2021-12-24 LAB — GLUCOSE, CAPILLARY: Glucose-Capillary: 177 mg/dL — ABNORMAL HIGH (ref 70–99)

## 2021-12-24 MED ORDER — METFORMIN HCL 850 MG PO TABS
ORAL_TABLET | ORAL | 1 refills | Status: DC
  Filled 2021-12-24: qty 180, 90d supply, fill #0
  Filled 2022-05-01: qty 180, 90d supply, fill #1

## 2021-12-24 MED ORDER — FLUDEOXYGLUCOSE F - 18 (FDG) INJECTION
11.8000 | Freq: Once | INTRAVENOUS | Status: AC | PRN
  Administered 2021-12-24: 12.26 via INTRAVENOUS

## 2021-12-26 ENCOUNTER — Ambulatory Visit: Payer: HMO | Admitting: Cardiovascular Disease

## 2021-12-30 ENCOUNTER — Ambulatory Visit: Payer: HMO | Admitting: Pulmonary Disease

## 2021-12-31 ENCOUNTER — Encounter: Payer: Self-pay | Admitting: Pulmonary Disease

## 2021-12-31 ENCOUNTER — Ambulatory Visit: Payer: HMO | Admitting: Pulmonary Disease

## 2021-12-31 VITALS — BP 120/80 | HR 63 | Temp 98.2°F | Ht 70.0 in | Wt 232.0 lb

## 2021-12-31 DIAGNOSIS — J439 Emphysema, unspecified: Secondary | ICD-10-CM | POA: Diagnosis not present

## 2021-12-31 DIAGNOSIS — G4733 Obstructive sleep apnea (adult) (pediatric): Secondary | ICD-10-CM | POA: Diagnosis not present

## 2021-12-31 DIAGNOSIS — R911 Solitary pulmonary nodule: Secondary | ICD-10-CM

## 2021-12-31 DIAGNOSIS — Z23 Encounter for immunization: Secondary | ICD-10-CM | POA: Diagnosis not present

## 2021-12-31 DIAGNOSIS — G4734 Idiopathic sleep related nonobstructive alveolar hypoventilation: Secondary | ICD-10-CM

## 2021-12-31 DIAGNOSIS — J4489 Other specified chronic obstructive pulmonary disease: Secondary | ICD-10-CM

## 2021-12-31 NOTE — Assessment & Plan Note (Signed)
Continue 2 L of oxygen during sleep. We will repeat nocturnal oximetry in January on room air after he has been on LABA/LAMA combination for 3 months

## 2021-12-31 NOTE — Patient Instructions (Addendum)
X flu shot today X change follow-up low-dose CT chest to January  RSV vaccine and COVID booster advised. Start Anoro  X repeat nocturnal oximetry on room air in January

## 2021-12-31 NOTE — Assessment & Plan Note (Addendum)
Compliant with inspire by report.  reviewed download >> set at 2.1 V, very compliant If nocturnal hypoxia persists, we may have to titrate up voltage since he had residual AHI of 18 on home sleep test

## 2021-12-31 NOTE — Assessment & Plan Note (Signed)
Asked him to start taking Anoro on a daily basis as maintenance therapy He can use albuterol on as-needed basis. We discussed COPD action plan and signs and symptoms of COPD exacerbation
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
## 2021-12-31 NOTE — Assessment & Plan Note (Signed)
Low SUV on PET scan is reassuring, we reviewed this. Can defer repeat CT chest to January which would be 3 months from the PET scan

## 2021-12-31 NOTE — Addendum Note (Signed)
Addended by: Vanessa Barbara on: 12/31/2021 03:43 PM   Modules accepted: Orders

## 2022-01-06 DIAGNOSIS — J438 Other emphysema: Secondary | ICD-10-CM | POA: Insufficient documentation

## 2022-01-07 ENCOUNTER — Telehealth: Payer: Self-pay | Admitting: Pulmonary Disease

## 2022-01-07 NOTE — Telephone Encounter (Signed)
Called and spoke with pt and advised him that he was correct and that Dr. Elsworth Soho wants the ONO done in January. Patient voiced understanding and will tell DME company if they call again. Nothing further needed for now.

## 2022-01-08 ENCOUNTER — Other Ambulatory Visit (HOSPITAL_COMMUNITY): Payer: Self-pay

## 2022-01-08 ENCOUNTER — Other Ambulatory Visit: Payer: Self-pay | Admitting: Cardiovascular Disease

## 2022-01-08 MED ORDER — LOSARTAN POTASSIUM 100 MG PO TABS
100.0000 mg | ORAL_TABLET | Freq: Every day | ORAL | 3 refills | Status: DC
  Filled 2022-01-08: qty 90, 90d supply, fill #0

## 2022-01-19 ENCOUNTER — Telehealth: Payer: Self-pay | Admitting: Cardiovascular Disease

## 2022-01-19 ENCOUNTER — Other Ambulatory Visit: Payer: Self-pay

## 2022-01-19 ENCOUNTER — Emergency Department
Admission: EM | Admit: 2022-01-19 | Discharge: 2022-01-19 | Discharge status: Against Medical Advice | Payer: HMO | Attending: Emergency Medicine | Admitting: Emergency Medicine

## 2022-01-19 ENCOUNTER — Emergency Department: Payer: HMO

## 2022-01-19 DIAGNOSIS — R0602 Shortness of breath: Secondary | ICD-10-CM | POA: Insufficient documentation

## 2022-01-19 DIAGNOSIS — R4701 Aphasia: Secondary | ICD-10-CM | POA: Diagnosis not present

## 2022-01-19 DIAGNOSIS — R42 Dizziness and giddiness: Secondary | ICD-10-CM | POA: Insufficient documentation

## 2022-01-19 DIAGNOSIS — Z1152 Encounter for screening for COVID-19: Secondary | ICD-10-CM | POA: Diagnosis not present

## 2022-01-19 DIAGNOSIS — Z5321 Procedure and treatment not carried out due to patient leaving prior to being seen by health care provider: Secondary | ICD-10-CM | POA: Insufficient documentation

## 2022-01-19 LAB — COMPREHENSIVE METABOLIC PANEL
ALT: 20 U/L (ref 0–44)
AST: 24 U/L (ref 15–41)
Albumin: 4.2 g/dL (ref 3.5–5.0)
Alkaline Phosphatase: 71 U/L (ref 38–126)
Anion gap: 9 (ref 5–15)
BUN: 26 mg/dL — ABNORMAL HIGH (ref 8–23)
CO2: 28 mmol/L (ref 22–32)
Calcium: 9.3 mg/dL (ref 8.9–10.3)
Chloride: 101 mmol/L (ref 98–111)
Creatinine, Ser: 1.3 mg/dL — ABNORMAL HIGH (ref 0.61–1.24)
GFR, Estimated: 57 mL/min — ABNORMAL LOW (ref >60)
Glucose, Bld: 164 mg/dL — ABNORMAL HIGH (ref 70–99)
Potassium: 4.7 mmol/L (ref 3.5–5.1)
Sodium: 138 mmol/L (ref 135–145)
Total Bilirubin: 1.5 mg/dL — ABNORMAL HIGH (ref 0.3–1.2)
Total Protein: 7.7 g/dL (ref 6.5–8.1)

## 2022-01-19 LAB — URINALYSIS, ROUTINE W REFLEX MICROSCOPIC
Bacteria, UA: NONE SEEN
Bilirubin Urine: NEGATIVE
Glucose, UA: >=500 mg/dL — AB
Hgb urine dipstick: NEGATIVE
Ketones, ur: 5 mg/dL — AB
Leukocytes,Ua: NEGATIVE
Nitrite: NEGATIVE
Protein, ur: NEGATIVE mg/dL
Specific Gravity, Urine: 1.023 (ref 1.005–1.030)
pH: 5 (ref 5.0–8.0)

## 2022-01-19 LAB — CBC WITH DIFFERENTIAL/PLATELET
Abs Immature Granulocytes: 0.06 10*3/uL (ref 0.00–0.07)
Basophils Absolute: 0.1 10*3/uL (ref 0.0–0.1)
Basophils Relative: 1 %
Eosinophils Absolute: 0.1 10*3/uL (ref 0.0–0.5)
Eosinophils Relative: 1 %
HCT: 49.6 % (ref 39.0–52.0)
Hemoglobin: 15.4 g/dL (ref 13.0–17.0)
Immature Granulocytes: 1 %
Lymphocytes Relative: 12 %
Lymphs Abs: 1.5 10*3/uL (ref 0.7–4.0)
MCH: 30.4 pg (ref 26.0–34.0)
MCHC: 31 g/dL (ref 30.0–36.0)
MCV: 97.8 fL (ref 80.0–100.0)
Monocytes Absolute: 0.9 10*3/uL (ref 0.1–1.0)
Monocytes Relative: 7 %
Neutro Abs: 9.4 10*3/uL — ABNORMAL HIGH (ref 1.7–7.7)
Neutrophils Relative %: 78 %
Platelets: 230 10*3/uL (ref 150–400)
RBC: 5.07 MIL/uL (ref 4.22–5.81)
RDW: 13.3 % (ref 11.5–15.5)
WBC: 12 10*3/uL — ABNORMAL HIGH (ref 4.0–10.5)
nRBC: 0 % (ref 0.0–0.2)

## 2022-01-19 LAB — TROPONIN I (HIGH SENSITIVITY): Troponin I (High Sensitivity): 7 ng/L (ref <18)

## 2022-01-19 LAB — LACTIC ACID, PLASMA: Lactic Acid, Venous: 2.1 mmol/L (ref 0.5–1.9)

## 2022-01-19 LAB — RESP PANEL BY RT-PCR (FLU A&B, COVID) ARPGX2
Influenza A by PCR: NEGATIVE
Influenza B by PCR: NEGATIVE
SARS Coronavirus 2 by RT PCR: NEGATIVE

## 2022-01-19 LAB — PROTIME-INR
INR: 1.2 (ref 0.8–1.2)
Prothrombin Time: 14.8 seconds (ref 11.4–15.2)

## 2022-01-19 LAB — BRAIN NATRIURETIC PEPTIDE: B Natriuretic Peptide: 108.8 pg/mL — ABNORMAL HIGH (ref 0.0–100.0)

## 2022-01-19 NOTE — ED Triage Notes (Signed)
Pt arrives with c/o dizziness that started earlier this evening. Pt endorses SOB. Pt has had sick symptoms for the last 2-3 weeks. Pt treated recently with antibiotics.

## 2022-01-19 NOTE — Telephone Encounter (Signed)
*  STAT* If patient is at the pharmacy, call can be transferred to refill team.   1. Which medications need to be refilled? (please list name of each medication and dose if known)   carvedilol (COREG) 6.25 MG tablet  2. Which pharmacy/location (including street and city if local pharmacy) is medication to be sent to?  [place], [place]  3. Do they need a 30 day or 90 day supply?  90 day  Patient stated he has 3 tablets left.

## 2022-01-19 NOTE — ED Provider Triage Note (Signed)
Emergency Medicine Provider Triage Evaluation Note  Howard Maxwell , a 76 y.o. male  was evaluated in triage.  Pt complains of dizziness, difficulty formulating words. Patient has had 2 weeks of URI symptoms. On abx for 10 days. 2 days of worsening symptoms after finishing abx. Dizziness and word difficulty started at 430. These symptoms have largely improved.   Review of Systems  Positive: Dizziness, cough, shortness of breath, chest discomfort, difficulty formulating words Negative: Emesis, diarrhea, fever, chills  Physical Exam  BP 98/78   Pulse 97   Temp 97.9 F (36.6 C) (Oral)   Resp 19   Ht 5\' 10"  (1.778 m)   Wt 105.2 kg   SpO2 96%   BMI 33.29 kg/m  Gen:   Awake, no distress   Resp:  Normal effort  MSK:   Moves extremities without difficulty  Other:  Artificial eye, no other acute neuro deficits on CN testing  Medical Decision Making  Medically screening exam initiated at 7:09 PM.  Appropriate orders placed.  Howard Maxwell was informed that the remainder of the evaluation will be completed by another provider, this initial triage assessment does not replace that evaluation, and the importance of remaining in the ED until their evaluation is complete.  PAtient arrives with dizziness and difficulty forming words. Symptoms started at 430. Largely resolved now. Labs, CT, xray, urine will be ordered for eval   Darletta Moll, PA-C 01/19/22 1909

## 2022-01-19 NOTE — Telephone Encounter (Signed)
Spoke with [employer] at [facility]. She states they did receive this refill and will get it ready for the patient to pick up.  carvedilol (COREG) 6.25 MG tablet 180 tablet 3 11/18/2021    Sig: TAKE 1 TABLET BY MOUTH TWICE DAILY   Sent to pharmacy as: carvedilol (COREG) 6.25 MG tablet   E-Prescribing Status: Receipt confirmed by pharmacy (11/18/2021  2:13 PM EDT)    Pharmacy  [place], [address]

## 2022-01-20 ENCOUNTER — Telehealth: Payer: Self-pay | Admitting: Emergency Medicine

## 2022-01-20 NOTE — Telephone Encounter (Signed)
Called patient due to left emergency department before provider exam to inquire about condition and follow up plans. Patient says he does have appt in next couple weeks.  Says he is feeling better now.  I eplained the elevated lactic and concern for infection.  I asked him to call his pcp to have them review the labs.

## 2022-01-21 ENCOUNTER — Telehealth: Payer: Self-pay | Admitting: Cardiovascular Disease

## 2022-01-21 NOTE — Telephone Encounter (Signed)
Reviewed ED testing. Patient had elevated lactic acid, WBC. And  abnormal UA. Note in chart that triage nurse from ED also called patient and advised him to follow up with his PCP.   Appropriate appointment made for patient. Closing encounter.

## 2022-01-21 NOTE — Telephone Encounter (Signed)
New Message:    Patient wen to [facility] ER on 01-19-22. He said at that time he was dizzy , short of breath and could not get a blood pressure reading. He said he did not stay, because there were 11 patients ahead of him. Today he does not have any symptoms. He wanted to have an appointment with Dr Fletcher Anon. Appointment was made for him 01-22-22 with Fabian Sharp.   Pt c/o Shortness Of Breath: STAT if SOB developed within the last 24 hours or pt is noticeably SOB on the phone  1. Are you currently SOB (can you hear that pt is SOB on the phone)? No- had episode on Monday 01-19-22  2. How long have you been experiencing SOB? Only had episode with this on Monday  3. Are you SOB when sitting or when up moving around? Both  4.  Are you currently experiencing any other symptoms? At that time on Monday he was real dizzy, also when he was able to take blood pressure his blood pressure was low-  99/52 on Monday night, 96/50 yesterday morning it was 102/52 , later  115/58 this morning it is 126/54morning 126/79op numberd
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
## 2022-01-22 ENCOUNTER — Ambulatory Visit: Payer: HMO | Attending: Physician Assistant | Admitting: General Practice

## 2022-01-22 ENCOUNTER — Encounter: Payer: Self-pay | Admitting: General Practice

## 2022-01-22 VITALS — BP 104/58 | HR 57 | Ht 70.0 in | Wt 230.2 lb

## 2022-01-22 DIAGNOSIS — R42 Dizziness and giddiness: Secondary | ICD-10-CM | POA: Diagnosis not present

## 2022-01-22 DIAGNOSIS — I471 Supraventricular tachycardia, unspecified: Secondary | ICD-10-CM

## 2022-01-22 DIAGNOSIS — I2510 Atherosclerotic heart disease of native coronary artery without angina pectoris: Secondary | ICD-10-CM

## 2022-01-22 DIAGNOSIS — E785 Hyperlipidemia, unspecified: Secondary | ICD-10-CM

## 2022-01-22 DIAGNOSIS — I10 Essential (primary) hypertension: Secondary | ICD-10-CM | POA: Diagnosis not present

## 2022-01-22 MED ORDER — LOSARTAN POTASSIUM 50 MG PO TABS: 50.0000 mg | ORAL_TABLET | Freq: Every day | ORAL | 6 refills | Status: DC

## 2022-01-22 NOTE — Patient Instructions (Signed)
Medication Instructions:  DECREASE LOSARTAN 50MG  DAILY (1/2 TAB) *If you need a refill on your cardiac medications before your next appointment, please call your pharmacy*   Lab Work: NONE  Testing/Procedures: NONE  Other Instructions TAKE AND LOG YOUR BLOOD PRESSURE  MAINTAIN HYDRATION  Follow-Up: At [facility], you and your health needs are our priority.  As part of our continuing mission to provide you with exceptional heart care, we have created designated Provider Care Teams.  These Care Teams include your primary Cardiologist (physician) and Advanced Practice Providers (APPs -  Physician Assistants and Nurse Practitioners) who all work together to provide you with the care you need, when you need it.  Your next appointment:   4 month(s)  The format for your next appointment:   In Person  Provider:   Kathlyn Sacramento, MD    Important Information About Sugar

## 2022-01-23 ENCOUNTER — Ambulatory Visit: Payer: HMO | Admitting: Physician Assistant

## 2022-02-09 ENCOUNTER — Other Ambulatory Visit (HOSPITAL_COMMUNITY): Payer: Self-pay

## 2022-02-09 ENCOUNTER — Ambulatory Visit: Payer: HMO

## 2022-02-25 ENCOUNTER — Telehealth: Payer: Self-pay | Admitting: Pain Medicine

## 2022-02-25 NOTE — Telephone Encounter (Signed)
PA DONE PER CMM 02/25/2022 PER [facility]

## 2022-02-25 NOTE — Telephone Encounter (Signed)
PT stated that he got a letter from his insurance [employer] to send in [place] for 2024 medications. Please give patient a call if you have any questions. Fax [phone] RX advance

## 2022-03-03 ENCOUNTER — Other Ambulatory Visit (HOSPITAL_COMMUNITY): Payer: Self-pay
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
## 2022-03-09 ENCOUNTER — Ambulatory Visit: Payer: HMO | Attending: Pain Medicine | Admitting: Pain Medicine

## 2022-03-09 ENCOUNTER — Encounter: Payer: Self-pay | Admitting: Pain Medicine

## 2022-03-09 VITALS — BP 165/67 | HR 61 | Temp 97.9°F | Ht 70.0 in | Wt 220.0 lb

## 2022-03-09 DIAGNOSIS — M25559 Pain in unspecified hip: Secondary | ICD-10-CM | POA: Diagnosis present

## 2022-03-09 DIAGNOSIS — G8929 Other chronic pain: Secondary | ICD-10-CM | POA: Insufficient documentation

## 2022-03-09 DIAGNOSIS — G894 Chronic pain syndrome: Secondary | ICD-10-CM | POA: Insufficient documentation

## 2022-03-09 DIAGNOSIS — M961 Postlaminectomy syndrome, not elsewhere classified: Secondary | ICD-10-CM | POA: Insufficient documentation

## 2022-03-09 DIAGNOSIS — Z79891 Long term (current) use of opiate analgesic: Secondary | ICD-10-CM | POA: Insufficient documentation

## 2022-03-09 DIAGNOSIS — M545 Low back pain, unspecified: Secondary | ICD-10-CM | POA: Diagnosis present

## 2022-03-09 DIAGNOSIS — M5414 Radiculopathy, thoracic region: Secondary | ICD-10-CM | POA: Diagnosis present

## 2022-03-09 DIAGNOSIS — M79602 Pain in left arm: Secondary | ICD-10-CM | POA: Insufficient documentation

## 2022-03-09 DIAGNOSIS — M79601 Pain in right arm: Secondary | ICD-10-CM | POA: Diagnosis present

## 2022-03-09 DIAGNOSIS — Z79899 Other long term (current) drug therapy: Secondary | ICD-10-CM | POA: Insufficient documentation

## 2022-03-09 DIAGNOSIS — M542 Cervicalgia: Secondary | ICD-10-CM | POA: Insufficient documentation

## 2022-03-09 MED ORDER — MORPHINE SULFATE ER 15 MG PO TBCR: 15.0000 mg | EXTENDED_RELEASE_TABLET | Freq: Two times a day (BID) | ORAL | 0 refills | Status: DC

## 2022-03-09 MED ORDER — NALOXONE HCL 4 MG/0.1ML NA LIQD: 1.0000 | NASAL | 0 refills | Status: AC | PRN

## 2022-03-09 NOTE — Patient Instructions (Signed)
____________________________________________________________________________________________  National Pain Medication Shortage  The [place] is experiencing worsening drug shortages. These have had a negative widespread effect on patient care and treatment. Not expected to improve any time soon. Predicted to last past 2029.   Drug shortage list (generic names) Oxycodone IR Oxycodone/APAP Oxymorphone IR Hydromorphone Hydrocodone/APAP Morphine  Where is the problem?  Manufacturing and supply level.  Will this shortage affect you?  Only if you take any of the above pain medications.  How? You may be unable to fill your prescription.  Your pharmacist may offer a "partial fill" of your prescription. (Warning: Do not accept partial fills.) Prescriptions partially filled cannot be transferred to another pharmacy. Read our Medication Rules and Regulation. Depending on how much medicine you are dependent on, you may experience withdrawals when unable to get the medication.  Recommendations: Consider ending your dependence on opioid pain medications. Ask your pain specialist to assist you with the process. Consider switching to a medication currently not in shortage, such as Buprenorphine. Talk to your pain specialist about this option. Consider decreasing your pain medication requirements by managing tolerance thru "Drug Holidays". This may help minimize withdrawals, should you run out of medicine. Control your pain thru the use of non-pharmacological interventional therapies.   Your prescriber: Prescribers cannot be blamed for shortages. Medication manufacturing and supply issues cannot be fixed by the prescriber.   NOTE: The prescriber is not responsible for supplying the medication, or solving supply issues. Work with your pharmacist to solve it. The patient is responsible for the decision to take or continue taking the medication and for identifying and securing a legal supply source. By  law, supplying the medication is the job and responsibility of the pharmacy. The prescriber is responsible for the evaluation, monitoring, and prescribing of these medications.   Prescribers will NOT: Re-issue prescriptions that have been partially filled. Re-issue prescriptions already sent to a pharmacy.  Re-send prescriptions to a different pharmacy because yours did not have your medication. Ask pharmacist to order more medicine or transfer the prescription to another pharmacy. (Read below.)  New 2023 regulation: "November 21, 2021 Revised Regulation Allows DEA-Registered Pharmacies to Transfer Electronic Prescriptions at a Patient's Request [place] Patients now have the ability to request their electronic prescription be transferred to another pharmacy without having to go back to their practitioner to initiate the request. This revised regulation went into effect on Monday, November 17, 2021.     At a patient's request, a DEA-registered retail pharmacy can now transfer an electronic prescription for a controlled substance (schedules II-V) to another DEA-registered retail pharmacy. Prior to this change, patients would have to go through their practitioner to cancel their prescription and have it re-issued to a different pharmacy. The process was taxing and time consuming for both patients and practitioners.    The Drug Enforcement Administration [facility]) published its intent to revise the process for transferring electronic prescriptions on February 09, 2020.  The final rule was published in the federal register on October 16, 2021 and went into effect 30 days later.  Under the final rule, a prescription can only be transferred once between pharmacies, and only if allowed under existing state or other applicable law. The prescription must remain in its electronic form; may not be altered in any way; and the transfer must be communicated directly between  two licensed pharmacists. It's important to note, any authorized refills transfer with the original prescription, which means the entire prescription will  be filled at the same pharmacy".  Reference: [email or web address] [facility] website announcement)  [email or web address] ([place])   [employer] / Vol. 88, No. 143 / Thursday, October 16, 2021 / Rules and Regulations DEPARTMENT OF JUSTICE  Drug Enforcement Administration  21 CFR Part 1306  [Docket No. [id number]]  RIN [id number] Transfer of Electronic Prescriptions for Schedules II-V Controlled Substances Between Pharmacies for Initial Filling  ____________________________________________________________________________________________     ____________________________________________________________________________________________  Drug Holidays  What is a "Drug Holiday"? Drug Holiday: is the name given to the process of slowly tapering down and temporarily stopping the pain medication for the purpose of decreasing or eliminating tolerance to the drug.  Benefits Improved effectiveness Decreased required effective dose Improved pain control End dependence on high dose therapy Decrease cost of therapy Uncovering "opioid-induced hyperalgesia". (OIH)  What is "opioid hyperalgesia"? It is a paradoxical increase in pain caused by exposure to opioids. Stopping the opioid pain medication, contrary to the expected, it actually decreases or completely eliminates the pain. Ref.: "A comprehensive review of opioid-induced hyperalgesia". Brion Aliment, et.al. Pain Physician. 2011 Mar-Apr;14(2):145-61.  What is tolerance? Tolerance: the progressive loss of effectiveness of a pain medicine due to repetitive use. A common problem of opioid pain medications.  How long should a "Drug  Holiday" last? Effectiveness depends on the patient staying off all opioid pain medicines for a minimum of 14 consecutive days. (2 weeks)  How about just taking less of the medicine? Does not work. Will not accomplish goal of eliminating the excess receptors.  How about switching to a different pain medicine? (AKA. "Opioid rotation") Does not work. Creates the illusion of effectiveness by taking advantage of inaccurate equivalent dose calculations between different opioids. -This "technique" was promoted by studies funded by [employer], such as [employer], creators of "OxyContin".  Can I stop the medicine "cold [place]"? Depends. You should always coordinate with your Pain Specialist to make the transition as smoothly as possible. Avoid stopping the medicine abruptly without consulting. We recommend a "slow taper".  What is a slow taper? Taper: refers to the gradual decrease in dose.   How do I stop/taper the dose? Slowly. Decrease the daily amount of pills that you take by one (1) pill every seven (7) days. This is called a "slow downward taper". Example: if you normally take four (4) pills per day, drop it to three (3) pills per day for seven (7) days, then to two (2) pills per day for seven (7) days, then to one (1) per day for seven (7) days, and then stop the medicine. The 14 day "Drug Holiday" starts on the first day without medicine.   Will I experience withdrawals? Unlikely with a slow taper.  What triggers withdrawals? Withdrawals are triggered by the sudden/abrupt stop of high dose opioids. Withdrawals can be avoided by slowly decreasing the dose over a prolonged period of time.  What are withdrawals? Symptoms associated with sudden/abrupt reduction/stopping of high-dose, long-term use of pain medication. Withdrawal are seldom seen on low dose therapy, or patients rarely taking opioid medication.  Early Withdrawal Symptoms may include: Agitation Anxiety Muscle  aches Increased tearing Insomnia Runny nose Sweating Yawning  Late symptoms may include: Abdominal cramping Diarrhea Dilated pupils Goose bumps Nausea Vomiting  (Last update: 03/01/2022) ____________________________________________________________________________________________    ____________________________________________________________________________________________  Patient Information update  To: All of our patients.  Re: Name change.  It has been made official that our current name, "[facility]"  will soon be changed to "[facility]".   The purpose of this change is to eliminate any confusion created by the concept of our practice being a "Medication Management Pain Clinic". In the past this has led to the misconception that we treat pain primarily by the use of prescription medications.  Nothing can be farther from the truth.   Understanding PAIN MANAGEMENT: To further understand what our practice does, you first have to understand that "Pain Management" is a subspecialty that requires additional training once a physician has completed their specialty training, which can be in either Anesthesia, Neurology, Psychiatry, or Physical Medicine and Rehabilitation (PMR). Each one of these contributes to the final approach taken by each physician to the management of their patient's pain. To be a "Pain Management Specialist" you must have first completed one of the specialty trainings below.  Anesthesiologists - trained in clinical pharmacology and interventional techniques such as nerve blockade and regional as well as central neuroanatomy. They are trained to block pain before, during, and after surgical interventions.  Neurologists - trained in the diagnosis and pharmacological treatment of complex neurological conditions, such as Multiple Sclerosis, Parkinson's,  spinal cord injuries, and other systemic conditions that may be associated with symptoms that may include but are not limited to pain. They tend to rely primarily on the treatment of chronic pain using prescription medications.  Psychiatrist - trained in conditions affecting the psychosocial wellbeing of patients including but not limited to depression, anxiety, schizophrenia, personality disorders, addiction, and other substance use disorders that may be associated with chronic pain. They tend to rely primarily on the treatment of chronic pain using prescription medications.   Physical Medicine and Rehabilitation (PMR) physicians, also known as physiatrists - trained to treat a wide variety of medical conditions affecting the brain, spinal cord, nerves, bones, joints, ligaments, muscles, and tendons. Their training is primarily aimed at treating patients that have suffered injuries that have caused severe physical impairment. Their training is primarily aimed at the physical therapy and rehabilitation of those patients. They may also work alongside orthopedic surgeons or neurosurgeons using their expertise in assisting surgical patients to recover after their surgeries.  INTERVENTIONAL PAIN MANAGEMENT is sub-subspecialty of Pain Management.  Our physicians are Board-certified in Anesthesia, Pain Management, and Interventional Pain Management.  This meaning that not only have they been trained and Board-certified in their specialty of Anesthesia, and subspecialty of Pain Management, but they have also received further training in the sub-subspecialty of Interventional Pain Management, in order to become Board-certified as INTERVENTIONAL PAIN MANAGEMENT SPECIALIST.    Mission: Our goal is to use our skills in  [place] as alternatives to the chronic use of prescription opioid medications for the treatment of pain. To make this more clear, we have changed our name to reflect what we do  and offer. We will continue to offer medication management assessment and recommendations, but we will not be taking over any patient's medication management.  ____________________________________________________________________________________________     _______________________________________________________________________  Medication Rules  Purpose: To inform patients, and their family members, of our medication rules and regulations.  Applies to: All patients receiving prescriptions from our practice (written or electronic).  Pharmacy of record: This is the pharmacy where your electronic prescriptions will be sent. Make sure we have the correct one.  Electronic prescriptions: In compliance with the [place] (STOP) Act of 2017 (Session Law 2017-74/H243), effective March 23, 2018,  all controlled substances must be electronically prescribed. Written prescriptions, faxing, or calling prescriptions to a pharmacy will no longer be done.  Prescription refills: These will be provided only during in-person appointments. No medications will be renewed without a "face-to-face" evaluation with your provider. Applies to all prescriptions.  NOTE: The following applies primarily to controlled substances (Opioid* Pain Medications).   Type of encounter (visit): For patients receiving controlled substances, face-to-face visits are required. (Not an option and not up to the patient.)  Patient's responsibilities: Pain Pills: Bring all pain pills to every appointment (except for procedure appointments). Pill Bottles: Bring pills in original pharmacy bottle. Bring bottle, even if empty. Always bring the bottle of the most recent fill.  Medication refills: You are responsible for knowing and keeping track of what medications you are taking and when is it that you will need a refill. The day before your appointment: write a list of all prescriptions that need to  be refilled. The day of the appointment: give the list to the admitting nurse. Prescriptions will be written only during appointments. No prescriptions will be written on procedure days. If you forget a medication: it will not be "Called in", "Faxed", or "electronically sent". You will need to get another appointment to get these prescribed. No early refills. Do not call asking to have your prescription filled early. Partial  or short prescriptions: Occasionally your pharmacy may not have enough pills to fill your prescription.  NEVER ACCEPT a partial fill or a prescription that is short of the total amount of pills that you were prescribed.  With controlled substances the law allows 72 hours for the pharmacy to complete the prescription.  If the prescription is not completed within 72 hours, the pharmacist will require a new prescription to be written. This means that you will be short on your medicine and we WILL NOT send another prescription to complete your original prescription.  Instead, request the pharmacy to send a carrier to a nearby branch to get enough medication to provide you with your full prescription. Prescription Accuracy: You are responsible for carefully inspecting your prescriptions before leaving our office. Have the discharge nurse carefully go over each prescription with you, before taking them home. Make sure that your name is accurately spelled, that your address is correct. Check the name and dose of your medication to make sure it is accurate. Check the number of pills, and the written instructions to make sure they are clear and accurate. Make sure that you are given enough medication to last until your next medication refill appointment. Taking Medication: Take medication as prescribed. When it comes to controlled substances, taking less pills or less frequently than prescribed is permitted and encouraged. Never take more pills than instructed. Never take the medication more  frequently than prescribed.  Inform other Doctors: Always inform, all of your healthcare providers, of all the medications you take. Pain Medication from other Providers: You are not allowed to accept any additional pain medication from any other Doctor or Healthcare provider. There are two exceptions to this rule. (see below) In the event that you require additional pain medication, you are responsible for notifying us, as stated below. Cough Medicine: Often these contain an opioid, such as codeine or hydrocodone. Never accept or take cough medicine containing these opioids if you are already taking an opioid* medication. The combination may cause respiratory failure and death. Medication Agreement: You are responsible for carefully reading and following our Medication Agreement. This must be  signed before receiving any prescriptions from our practice. Safely store a copy of your signed Agreement. Violations to the Agreement will result in no further prescriptions. (Additional copies of our Medication Agreement are available upon request.) Laws, Rules, & Regulations: All patients are expected to follow all Federal and [employer], Rules, [employer]. Ignorance of the Laws does not constitute a valid excuse.  Illegal drugs and Controlled Substances: The use of illegal substances (including, but not limited to marijuana and its derivatives) and/or the illegal use of any controlled substances is strictly prohibited. Violation of this rule may result in the immediate and permanent discontinuation of any and all prescriptions being written by our practice. The use of any illegal substances is prohibited. Adopted CDC guidelines & recommendations: Target dosing levels will be at or below 60 MME/day. Use of benzodiazepines** is not recommended.  Exceptions: There are only two exceptions to the rule of not receiving pain medications from other Healthcare Providers. Exception #1 (Emergencies): In the  event of an emergency (i.e.: accident requiring emergency care), you are allowed to receive additional pain medication. However, you are responsible for: As soon as you are able, call our office [phone], at any time of the day or night, and leave a message stating your name, the date and nature of the emergency, and the name and dose of the medication prescribed. In the event that your call is answered by a member of our staff, make sure to document and save the date, time, and the name of the person that took your information.  Exception #2 (Planned Surgery): In the event that you are scheduled by another doctor or dentist to have any type of surgery or procedure, you are allowed (for a period no longer than 30 days), to receive additional pain medication, for the acute post-op pain. However, in this case, you are responsible for picking up a copy of our "Post-op Pain Management for Surgeons" handout, and giving it to your surgeon or dentist. This document is available at our office, and does not require an appointment to obtain it. Simply go to our office during business hours (Monday-Thursday from 8:00 AM to 4:00 PM) (Friday 8:00 AM to 12:00 Noon) or if you have a scheduled appointment with [place], prior to your surgery, and ask for it by name. In addition, you are responsible for: calling our office [phone], at any time of the day or night, and leaving a message stating your name, name of your surgeon, type of surgery, and date of procedure or surgery. Failure to comply with your responsibilities may result in termination of therapy involving the controlled substances. Medication Agreement Violation. Following the above rules, including your responsibilities will help you in avoiding a Medication Agreement Violation ("Breaking your Pain Medication Contract").  Consequences:  Not following the above rules may result in permanent discontinuation of medication prescription therapy.  *Opioid  medications include: morphine, codeine, oxycodone, oxymorphone, hydrocodone, hydromorphone, meperidine, tramadol, tapentadol, buprenorphine, fentanyl, methadone. **Benzodiazepine medications include: diazepam (Valium), alprazolam (Xanax), clonazepam (Klonopine), lorazepam (Ativan), clorazepate (Tranxene), chlordiazepoxide (Librium), estazolam (Prosom), oxazepam (Serax), temazepam (Restoril), triazolam (Halcion) (Last updated: 01/13/2022) ______________________________________________________________________    ______________________________________________________________________  Medication Recommendations and Reminders  Applies to: All patients receiving prescriptions (written and/or electronic).  Medication Rules & Regulations: You are responsible for reading, knowing, and following our "Medication Rules" document. These exist for your safety and that of others. They are not flexible and neither are we. Dismissing or ignoring them is an act of "  non-compliance" that may result in complete and irreversible termination of such medication therapy. For safety reasons, "non-compliance" will not be tolerated. As with the [place] fundamental legal principle of "ignorance of the law is no defense", we will accept no excuses for not having read and knowing the content of documents provided to you by our practice.  Pharmacy of record:  Definition: This is the pharmacy where your electronic prescriptions will be sent.  We do not endorse any particular pharmacy. It is up to you and your insurance to decide what pharmacy to use.  We do not restrict you in your choice of pharmacy. However, once we write for your prescriptions, we will NOT be re-sending more prescriptions to fix restricted supply problems created by your pharmacy, or your insurance.  The pharmacy listed in the electronic medical record should be the one where you want electronic prescriptions to be sent. If you choose to change pharmacy, simply  notify our nursing staff. Changes will be made only during your regular appointments and not over the phone.  Recommendations: Keep all of your pain medications in a safe place, under lock and key, even if you live alone. We will NOT replace lost, stolen, or damaged medication. We do not accept "Police Reports" as proof of medications having been stolen. After you fill your prescription, take 1 week's worth of pills and put them away in a safe place. You should keep a separate, properly labeled bottle for this purpose. The remainder should be kept in the original bottle. Use this as your primary supply, until it runs out. Once it's gone, then you know that you have 1 week's worth of medicine, and it is time to come in for a prescription refill. If you do this correctly, it is unlikely that you will ever run out of medicine. To make sure that the above recommendation works, it is very important that you make sure your medication refill appointments are scheduled at least 1 week before you run out of medicine. To do this in an effective manner, make sure that you do not leave the office without scheduling your next medication management appointment. Always ask the nursing staff to show you in your prescription , when your medication will be running out. Then arrange for the receptionist to get you a return appointment, at least 7 days before you run out of medicine. Do not wait until you have 1 or 2 pills left, to come in. This is very poor planning and does not take into consideration that we may need to cancel appointments due to bad weather, sickness, or emergencies affecting our staff. DO NOT ACCEPT A "Partial Fill": If for any reason your pharmacy does not have enough pills/tablets to completely fill or refill your prescription, do not allow for a "partial fill". The law allows the pharmacy to complete that prescription within 72 hours, without requiring a new prescription. If they do not fill the rest of  your prescription within those 72 hours, you will need a separate prescription to fill the remaining amount, which we will NOT provide. If the reason for the partial fill is your insurance, you will need to talk to the pharmacist about payment alternatives for the remaining tablets, but again, DO NOT ACCEPT A PARTIAL FILL, unless you can trust your pharmacist to obtain the remainder of the pills within 72 hours.  Prescription refills and/or changes in medication(s):  Prescription refills, and/or changes in dose or medication, will be conducted only during scheduled  medication management appointments. (Applies to both, written and electronic prescriptions.) No refills on procedure days. No medication will be changed or started on procedure days. No changes, adjustments, and/or refills will be conducted on a procedure day. Doing so will interfere with the diagnostic portion of the procedure. No phone refills. No medications will be "called into the pharmacy". No Fax refills. No weekend refills. No Holliday refills. No after hours refills.  Remember:  Business hours are:  Monday to Thursday 8:00 AM to 4:00 PM Provider's Schedule: Milinda Pointer, MD - Appointments are:  Medication management: Monday and Wednesday 8:00 AM to 4:00 PM Procedure day: Tuesday and Thursday 7:30 AM to 4:00 PM Gillis Santa, MD - Appointments are:  Medication management: Tuesday and Thursday 8:00 AM to 4:00 PM Procedure day: Monday and Wednesday 7:30 AM to 4:00 PM (Last update: 01/13/2022) ______________________________________________________________________    ____________________________________________________________________________________________  WARNING: CBD (cannabidiol) & Delta (Delta-8 tetrahydrocannabinol) products.   Applicable to:  All individuals currently taking or considering taking CBD (cannabidiol) and, more important, all patients taking opioid analgesic controlled substances (pain  medication). (Example: oxycodone; oxymorphone; hydrocodone; hydromorphone; morphine; methadone; tramadol; tapentadol; fentanyl; buprenorphine; butorphanol; dextromethorphan; meperidine; codeine; etc.)  Introduction:  Recently there has been a drive towards the use of "natural" products for the treatment of different conditions, including pain anxiety and sleep disorders. Marijuana and hemp are two varieties of the cannabis genus plants. Marijuana and its derivatives are illegal, while hemp and its derivatives are not. Cannabidiol (CBD) and tetrahydrocannabinol (THC), are two natural compounds found in plants of the Cannabis genus. They can both be extracted from hemp or marijuana. Both compounds interact with your body's endocannabinoid system in very different ways. CBD is associated with pain relief (analgesia) while THC is associated with the psychoactive effects ("the high") obtained from the use of marijuana products. There are two main types of THC: Delta-9, which comes from the marijuana plant and it is illegal, and Delta-8, which comes from the hemp plant, and it is legal. (Both, Delta-9-THC and Delta-8-THC are psychoactive and give you "the high".)   Legality:  Marijuana and its derivatives: illegal Hemp and its derivatives: Legal (State dependent) UPDATE: (05/09/2021) The Drug Enforcement Agency ([place]) issued a letter stating that "delta" cannabinoids, including Delta-8-THCO and Delta-9-THCO, synthetically derived from hemp do not qualify as hemp and will be viewed as Schedule I drugs. (Schedule I drugs, substances, or chemicals are defined as drugs with no currently accepted medical use and a high potential for abuse. Some examples of Schedule I drugs are: heroin, lysergic acid diethylamide (LSD), marijuana (cannabis), 3,4-methylenedioxymethamphetamine (ecstasy), methaqualone, and peyote.) (https://jennings.com/)  Legal status of CBD in [place]:  "Conditionally Legal"  Reference: "FDA Regulation of  Cannabis and Cannabis-Derived Products, Including Cannabidiol (CBD)" - SeekArtists.com.pt  Warning:  CBD is not FDA approved and has not undergo the same manufacturing controls as prescription drugs.  This means that the purity and safety of available CBD may be questionable. Most of the time, despite manufacturer's claims, it is contaminated with THC (delta-9-tetrahydrocannabinol - the chemical in marijuana responsible for the "HIGH").  When this is the case, the [facility] contaminant will trigger a positive urine drug screen (UDS) test for Marijuana (carboxy-THC).   The FDA recently put out a warning about 5 things that everyone should be aware of regarding Delta-8 THC: Delta-8 THC products have not been evaluated or approved by the FDA for safe use and may be marketed in ways that put the public health at risk. The  FDA has received adverse event reports involving delta-8 THC-containing products. Delta-8 THC has psychoactive and intoxicating effects. Delta-8 THC manufacturing often involve use of potentially harmful chemicals to create the concentrations of delta-8 THC claimed in the marketplace. The final delta-8 THC product may have potentially harmful by-products (contaminants) due to the chemicals used in the process. Manufacturing of delta-8 THC products may occur in uncontrolled or unsanitary settings, which may lead to the presence of unsafe contaminants or other potentially harmful substances. Delta-8 THC products should be kept out of the reach of children and pets.  NOTE: Because a positive UDS for any illicit substance is a violation of our medication agreement, your opioid analgesics (pain medicine) may be permanently discontinued.  MORE ABOUT CBD  General Information: CBD was discovered in [birth year] and it is a derivative of the cannabis sativa genus plants (Marijuana and Hemp). It is  one of the 113 identified substances found in Marijuana. It accounts for up to 40% of the plant's extract. As of 2018, preliminary clinical studies on CBD included research for the treatment of anxiety, movement disorders, and pain. CBD is available and consumed in multiple forms, including inhalation of smoke or vapor, as an aerosol spray, and by mouth. It may be supplied as an oil containing CBD, capsules, dried cannabis, or as a liquid solution. CBD is thought not to be as psychoactive as THC (delta-9-tetrahydrocannabinol - the chemical in marijuana responsible for the "HIGH"). Studies suggest that CBD may interact with different biological target receptors in the body, including cannabinoid and other neurotransmitter receptors. As of 2018 the mechanism of action for its biological effects has not been determined.  Side-effects  Adverse reactions: Dry mouth, diarrhea, decreased appetite, fatigue, drowsiness, malaise, weakness, sleep disturbances, and others.  Drug interactions:  CBD may interact with medications such as blood-thinners. CBD causes drowsiness on its own and it will increase drowsiness caused by other medications, including antihistamines (such as Benadryl), benzodiazepines (Xanax, Ativan, Valium), antipsychotics, antidepressants, opioids, alcohol and supplements such as kava, melatonin and St. John's Wort.  Other drug interactions: Brivaracetam (Briviact); Caffeine; Carbamazepine (Tegretol); Citalopram (Celexa); Clobazam (Onfi); Eslicarbazepine (Aptiom); Everolimus (Zostress); Lithium; Methadone (Dolophine); Rufinamide (Banzel); Sedative medications (CNS depressants); Sirolimus (Rapamune); Stiripentol (Diacomit); Tacrolimus (Prograf); Tamoxifen ; Soltamox); Topiramate (Topamax); Valproate; Warfarin (Coumadin); Zonisamide. (Last update: 03/02/2022) ____________________________________________________________________________________________    ____________________________________________________________________________________________  Naloxone Nasal Spray  Why am I receiving this medication? [place] STOP ACT requires that all patients taking high dose opioids or at risk of opioids respiratory depression, be prescribed an opioid reversal agent, such as Naloxone (AKA: Narcan).  What is this medication? NALOXONE (nal OX one) treats opioid overdose, which causes slow or shallow breathing, severe drowsiness, or trouble staying awake. Call emergency services after using this medication. You may need additional treatment. Naloxone works by reversing the effects of opioids. It belongs to a group of medications called opioid blockers.  COMMON BRAND NAME(S): Kloxxado, Narcan  What should I tell my care team before I take this medication? They need to know if you have any of these conditions: Heart disease Substance use disorder An unusual or allergic reaction to naloxone, other medications, foods, dyes, or preservatives Pregnant or trying to get pregnant Breast-feeding  When to use this medication? This medication is to be used for the treatment of respiratory depression (less than 8 breaths per minute) secondary to opioid overdose.   How to use this medication? This medication is for use in the nose. Lay the person on their back. Support  their neck with your hand and allow the head to tilt back before giving the medication. The nasal spray should be given into 1 nostril. After giving the medication, move the person onto their side. Do not remove or test the nasal spray until ready to use. Get emergency medical help right away after giving the first dose of this medication, even if the person wakes up. You should be familiar with how to recognize the signs and symptoms of a narcotic overdose. If more doses are needed, give the additional dose in the other nostril. Talk to your care team about the use of this medication in children.  While this medication may be prescribed for children as young as newborns for selected conditions, precautions do apply.  Naloxone Overdosage: If you think you have taken too much of this medicine contact a poison control center or emergency room at once.  NOTE: This medicine is only for you. Do not share this medicine with others.  What if I miss a dose? This does not apply.  What may interact with this medication? This is only used during an emergency. No interactions are expected during emergency use. This list may not describe all possible interactions. Give your health care provider a list of all the medicines, herbs, non-prescription drugs, or dietary supplements you use. Also tell them if you smoke, drink alcohol, or use illegal drugs. Some items may interact with your medicine.  What should I watch for while using this medication? Keep this medication ready for use in the case of an opioid overdose. Make sure that you have the phone number of your care team and local hospital ready. You may need to have additional doses of this medication. Each nasal spray contains a single dose. Some emergencies may require additional doses. After use, bring the treated person to the nearest hospital or call 911. Make sure the treating care team knows that the person has received a dose of this medication. You will receive additional instructions on what to do during and after use of this medication before an emergency occurs.  What side effects may I notice from receiving this medication? Side effects that you should report to your care team as soon as possible: Allergic reactions--skin rash, itching, hives, swelling of the face, lips, tongue, or throat Side effects that usually do not require medical attention (report these to your care team if they continue or are bothersome): Constipation Dryness or irritation inside the nose Headache Increase in blood pressure Muscle spasms Stuffy  nose Toothache This list may not describe all possible side effects. Call your doctor for medical advice about side effects. You may report side effects to FDA at 1-800-FDA-1088.  Where should I keep my medication? Because this is an emergency medication, you should keep it with you at all times.  Keep out of the reach of children and pets. Store between 20 and 25 degrees C (68 and 77 degrees F). Do not freeze. Throw away any unused medication after the expiration date. Keep in original box until ready to use.  NOTE: This sheet is a summary. It may not cover all possible information. If you have questions about this medicine, talk to your doctor, pharmacist, or health care provider.   2023 Elsevier/Gold Standard (2020-11-15 00:00:00)  ____________________________________________________________________________________________

## 2022-03-09 NOTE — Progress Notes (Signed)
Nursing Pain Medication Assessment:  Safety precautions to be maintained throughout the outpatient stay will include: orient to surroundings, keep bed in low position, maintain call bell within reach at all times, provide assistance with transfer out of bed and ambulation.  Medication Inspection Compliance: Pill count conducted under aseptic conditions, in front of the patient. Neither the pills nor the bottle was removed from the patient's sight at any time. Once count was completed pills were immediately returned to the patient in their original bottle.  Medication: Morphine IR Pill/Patch Count:  15 of 60 pills remain Pill/Patch Appearance: Markings consistent with prescribed medication Bottle Appearance: Standard pharmacy container. Clearly labeled. Filled Date: 27 / 27 / 2023 Last Medication intake:  Today

## 2022-03-21 ENCOUNTER — Other Ambulatory Visit (HOSPITAL_COMMUNITY): Payer: Self-pay

## 2022-03-23 ENCOUNTER — Other Ambulatory Visit: Payer: Self-pay

## 2022-03-24 ENCOUNTER — Other Ambulatory Visit (HOSPITAL_COMMUNITY): Payer: Self-pay

## 2022-03-24 ENCOUNTER — Other Ambulatory Visit: Payer: Self-pay

## 2022-03-24 DIAGNOSIS — Z86010 Personal history of colonic polyps: Secondary | ICD-10-CM | POA: Diagnosis not present

## 2022-03-24 DIAGNOSIS — R1310 Dysphagia, unspecified: Secondary | ICD-10-CM | POA: Diagnosis not present

## 2022-03-24 DIAGNOSIS — K219 Gastro-esophageal reflux disease without esophagitis: Secondary | ICD-10-CM | POA: Diagnosis not present

## 2022-03-24 DIAGNOSIS — F1120 Opioid dependence, uncomplicated: Secondary | ICD-10-CM | POA: Diagnosis not present

## 2022-03-24 MED ORDER — ATORVASTATIN CALCIUM 40 MG PO TABS
40.0000 mg | ORAL_TABLET | Freq: Every day | ORAL | 1 refills | Status: DC
  Filled 2022-03-24: qty 90, 90d supply, fill #0
  Filled 2022-05-17 – 2022-06-24 (×2): qty 90, 90d supply, fill #1

## 2022-03-25 ENCOUNTER — Other Ambulatory Visit (HOSPITAL_COMMUNITY): Payer: Self-pay

## 2022-03-30 ENCOUNTER — Ambulatory Visit
Admission: RE | Admit: 2022-03-30 | Discharge: 2022-03-30 | Disposition: A | Discharge status: Home / Self Care | Payer: PPO | Source: Ambulatory Visit | Attending: Acute Care | Admitting: Acute Care

## 2022-03-30 DIAGNOSIS — R911 Solitary pulmonary nodule: Secondary | ICD-10-CM | POA: Diagnosis not present

## 2022-03-30 DIAGNOSIS — Z87891 Personal history of nicotine dependence: Secondary | ICD-10-CM

## 2022-03-31 ENCOUNTER — Telehealth: Payer: Self-pay | Admitting: Pulmonary Disease

## 2022-03-31 NOTE — Telephone Encounter (Signed)
Pls call.

## 2022-03-31 NOTE — Telephone Encounter (Signed)
Received call report from Reynolds at [facility] LDCT chest done 03/30/22   Impression:  IMPRESSION: 1. Enlarging morphologically concerning pulmonary nodule in the periphery of the right upper lobe categorized as Lung-RADS 4B, suspicious. Additional imaging evaluation or consultation with Pulmonology or Thoracic Surgery recommended. 2. The "S" modifier above refers to potentially clinically significant non lung cancer related findings. Specifically, there is aortic atherosclerosis, in addition to three-vessel coronary artery disease. Please note that although the presence of coronary artery calcium documents the presence of coronary artery disease, the severity of this disease and any potential stenosis cannot be assessed on this non-gated CT examination. Assessment for potential risk factor modification, dietary therapy or pharmacologic therapy may be warranted, if clinically indicated. 3. Mild diffuse bronchial wall thickening with very mild centrilobular and paraseptal emphysema; imaging findings suggestive of underlying COPD.   These results will be called to the ordering clinician or representative by the Radiologist Assistant, and communication documented in the PACS or [employer].   Aortic Atherosclerosis (ICD10-I70.0) and Emphysema (ICD10-J43.9).     Electronically Signed   By: Vinnie Langton M.D.   On: 03/31/2022 10:10  Routing to Judson Roch

## 2022-04-01 DIAGNOSIS — G4733 Obstructive sleep apnea (adult) (pediatric): Secondary | ICD-10-CM | POA: Diagnosis not present

## 2022-04-01 DIAGNOSIS — G4700 Insomnia, unspecified: Secondary | ICD-10-CM | POA: Diagnosis not present

## 2022-04-01 DIAGNOSIS — E669 Obesity, unspecified: Secondary | ICD-10-CM | POA: Diagnosis not present

## 2022-04-01 DIAGNOSIS — I2510 Atherosclerotic heart disease of native coronary artery without angina pectoris: Secondary | ICD-10-CM | POA: Diagnosis not present

## 2022-04-01 DIAGNOSIS — G4734 Idiopathic sleep related nonobstructive alveolar hypoventilation: Secondary | ICD-10-CM | POA: Diagnosis not present

## 2022-04-02 ENCOUNTER — Telehealth: Payer: Self-pay | Admitting: Acute Care

## 2022-04-02 ENCOUNTER — Telehealth: Payer: Self-pay | Admitting: Pulmonary Disease

## 2022-04-02 ENCOUNTER — Telehealth: Payer: Self-pay | Admitting: Cardiovascular Disease

## 2022-04-02 DIAGNOSIS — R911 Solitary pulmonary nodule: Secondary | ICD-10-CM | POA: Insufficient documentation

## 2022-04-02 NOTE — Telephone Encounter (Signed)
Patient is calling stating heart issues were found on his lung cancer screening. He is requesting Arida review and determine if he needs a sooner appt because of it.

## 2022-04-02 NOTE — Telephone Encounter (Signed)
Patient informed of Dr. Tyrell Antonio reply: "He has evidence of coronary and aortic calcifications which is known from before. This is not a new finding. Follow-up as planned." Patient stated he will keep his March appointment.

## 2022-04-02 NOTE — Telephone Encounter (Signed)
He has evidence of coronary and aortic calcifications which is known from before.  This is not a new finding.  Follow-up as planned.

## 2022-04-02 NOTE — Telephone Encounter (Signed)
Results to PCP with plan for pulmonary to follow

## 2022-04-02 NOTE — Telephone Encounter (Signed)
I have called the patient with the results of his low dose CT Chest. I explained that the nodule of concern in his  RUL has grown from 8 mm ( SUV max of 1.5)  in October to 15.5 mm on LDCT done 03/30/2022. It  is now centrally cavitary, and has macrolobulated and slightly spiculated margins concerning for progressive neoplasm.I have explained to the patient that I want to speak with Dr. Elsworth Soho regarding best next steps for his care. I explained that options include bronchoscopy with biopsy now and potential referral to surgery based on those results vs  referral to surgery now. Pt. PFT's are adequate to qualify him for surgery. He favors removing the nodule, and agrees with whichever of the above options are in his best interest.  I have messaged Dr. Elsworth Soho through [facility], and I have called [facility] office where he is working  today and asked Beverley Fiedler to let him know I am trying to reach him  to ensure he is aware of the repeat CT findings, and to get his input regarding best care for the patient moving forward.   Langley Gauss, we will determine follow up after bronch or surgical evaluation. Please fax results to PCP and let them know plan of care moving forward. Thanks so much

## 2022-04-02 NOTE — Telephone Encounter (Signed)
PT calling for Dr. Elsworth Soho. Should he be seen? Recent tests show the growth is larger now. Pls call @ [phone]

## 2022-04-02 NOTE — Telephone Encounter (Addendum)
Patient stated he had chest CT on 1/8 and wants Dr. Fletcher Anon to review the portion relating to his heart and if he needs to move up his appointment from March.

## 2022-04-03 ENCOUNTER — Encounter (HOSPITAL_BASED_OUTPATIENT_CLINIC_OR_DEPARTMENT_OTHER): Payer: Self-pay | Admitting: Pulmonary Disease

## 2022-04-03 ENCOUNTER — Ambulatory Visit (INDEPENDENT_AMBULATORY_CARE_PROVIDER_SITE_OTHER): Payer: PPO | Admitting: Pulmonary Disease

## 2022-04-03 VITALS — BP 118/66 | HR 54 | Temp 98.3°F | Ht 70.0 in | Wt 225.8 lb

## 2022-04-03 DIAGNOSIS — J439 Emphysema, unspecified: Secondary | ICD-10-CM | POA: Diagnosis not present

## 2022-04-03 DIAGNOSIS — G4733 Obstructive sleep apnea (adult) (pediatric): Secondary | ICD-10-CM | POA: Diagnosis not present

## 2022-04-03 DIAGNOSIS — G4734 Idiopathic sleep related nonobstructive alveolar hypoventilation: Secondary | ICD-10-CM | POA: Diagnosis not present

## 2022-04-03 DIAGNOSIS — J4489 Other specified chronic obstructive pulmonary disease: Secondary | ICD-10-CM

## 2022-04-03 DIAGNOSIS — R911 Solitary pulmonary nodule: Secondary | ICD-10-CM

## 2022-04-03 MED ORDER — AMOXICILLIN-POT CLAVULANATE 875-125 MG PO TABS: 1.0000 | ORAL_TABLET | Freq: Two times a day (BID) | ORAL | 0 refills | Status: DC

## 2022-04-03 NOTE — Telephone Encounter (Signed)
PCCM:  Thanks Kathy Breach. I have a slot on the 22nd.  Thanks  BLI

## 2022-04-03 NOTE — Assessment & Plan Note (Signed)
Enlarging nodule within the 61-month duration with central cavitation, appears peripheral with an airway leading into the nodule.  Differential includes rapidly evolving neoplasm versus infection.  PET scan from October with low-grade hypermetabolism  would argue against an aggressive cancer.  Hence I feel that biopsy is necessary. We discussed alternatives including a wait and watch approach with repeat scan in 3 months.  I doubt PET scan would be helpful to differentiate infection from malignancy. I would certainly not subject him to empiric radiation.  His lung function is adequate for resection but comorbidities may preclude. I feel the best approach forward will be to proceed with navigational bronchoscopy, I will refer her to colleagues who do this.  I discussed risks and benefits of the procedure including that of general anesthesia and lung puncture requiring chest tube.  He would be agreeable to proceed. Meanwhile, we will empirically treat him with Augmentin for 7 days for infection

## 2022-04-03 NOTE — Assessment & Plan Note (Addendum)
Nocturnal hypoxia may indicate residual OSA in spite of compliance with inspire device or may be related to lung disease He may need further titration of inspire device  We will consider repeat titration for inspire in the future For now, repeat nocturnal oximetry on room air

## 2022-04-03 NOTE — Patient Instructions (Addendum)
X Rx for augmentin 875 mg twice daily x 7 days  Plan for bronchoscopy & biopsy   X ONO on RA

## 2022-04-03 NOTE — Assessment & Plan Note (Signed)
Continue Anoro. Discussed COPD action plan and signs and symptoms of COPD exacerbation
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
## 2022-04-03 NOTE — Telephone Encounter (Signed)
-----  Message from Oretha Milch, MD sent at 04/03/2022  4:52 PM EST ----- Rob/ Nida Boatman, can you please evaluate him for navigational bronch? Somewhat rapid progression >> initially consolidation then nodule, low SUV then cavitation , may be infection? Has an airway leading to nodule  Best,  Kathy Breach

## 2022-04-03 NOTE — Telephone Encounter (Signed)
Closing encounter pt has appt w/ Dr.Alva @ 3pm today. NFN

## 2022-04-06 ENCOUNTER — Encounter: Payer: Self-pay | Admitting: Pulmonary Disease

## 2022-04-06 ENCOUNTER — Other Ambulatory Visit: Payer: Self-pay

## 2022-04-06 DIAGNOSIS — R911 Solitary pulmonary nodule: Secondary | ICD-10-CM

## 2022-04-06 DIAGNOSIS — G4734 Idiopathic sleep related nonobstructive alveolar hypoventilation: Secondary | ICD-10-CM | POA: Diagnosis not present

## 2022-04-06 DIAGNOSIS — J449 Chronic obstructive pulmonary disease, unspecified: Secondary | ICD-10-CM | POA: Diagnosis not present

## 2022-04-06 NOTE — Telephone Encounter (Signed)
Pt has been scheduled.  Spoke to him and gave him appt info.  Nothing further needed in message.

## 2022-04-07 DIAGNOSIS — R262 Difficulty in walking, not elsewhere classified: Secondary | ICD-10-CM | POA: Diagnosis not present

## 2022-04-08 ENCOUNTER — Ambulatory Visit
Admission: RE | Admit: 2022-04-08 | Discharge: 2022-04-08 | Disposition: A | Discharge status: Home / Self Care | Payer: PPO | Source: Ambulatory Visit | Attending: Pulmonary Disease | Admitting: Pulmonary Disease

## 2022-04-08 DIAGNOSIS — R911 Solitary pulmonary nodule: Secondary | ICD-10-CM | POA: Insufficient documentation

## 2022-04-08 DIAGNOSIS — J439 Emphysema, unspecified: Secondary | ICD-10-CM | POA: Diagnosis not present

## 2022-04-09 ENCOUNTER — Other Ambulatory Visit: Payer: PPO

## 2022-04-09 DIAGNOSIS — R911 Solitary pulmonary nodule: Secondary | ICD-10-CM | POA: Diagnosis not present

## 2022-04-09 NOTE — Anesthesia Preprocedure Evaluation (Addendum)
Anesthesia Evaluation  Patient identified by MRN, date of birth, ID band Patient awake    Reviewed: Allergy & Precautions, H&P , NPO status , Patient's Chart, lab work & pertinent test results  Airway Mallampati: II  TM Distance: >3 FB Neck ROM: Full    Dental no notable dental hx.    Pulmonary sleep apnea , COPD, former smoker   Pulmonary exam normal breath sounds clear to auscultation       Cardiovascular hypertension, + CAD and + Past MI  Normal cardiovascular exam Rhythm:Regular Rate:Normal     Neuro/Psych negative neurological ROS  negative psych ROS   GI/Hepatic negative GI ROS, Neg liver ROS,,,  Endo/Other  diabetes, Type 2    Renal/GU negative Renal ROS  negative genitourinary   Musculoskeletal negative musculoskeletal ROS (+)    Abdominal   Peds negative pediatric ROS (+)  Hematology negative hematology ROS (+)   Anesthesia Other Findings   Reproductive/Obstetrics negative OB ROS                             Anesthesia Physical Anesthesia Plan  ASA: 3  Anesthesia Plan: General   Post-op Pain Management: Minimal or no pain anticipated   Induction: Intravenous  PONV Risk Score and Plan: 2 and Ondansetron, Dexamethasone and Treatment may vary due to age or medical condition  Airway Management Planned: Oral ETT  Additional Equipment:   Intra-op Plan:   Post-operative Plan: Extubation in OR  Informed Consent: I have reviewed the patients History and Physical, chart, labs and discussed the procedure including the risks, benefits and alternatives for the proposed anesthesia with the patient or authorized representative who has indicated his/her understanding and acceptance.     Dental advisory given  Plan Discussed with: CRNA and Surgeon  Anesthesia Plan Comments: (PAT note written 04/09/2022 by Shonna Chock, PA-C.  )       Anesthesia Quick Evaluation
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
## 2022-04-10 ENCOUNTER — Encounter (HOSPITAL_COMMUNITY): Payer: Self-pay | Admitting: Pulmonary Disease

## 2022-04-10 NOTE — Progress Notes (Signed)
PCP - Dr Barbette Reichmann Cardiologist - Dr Lorine Bears Pulmonology - Dr Cyril Mourning Pain Mgmt - Dr Hoy Morn ENT - Dr Christia Reading Neurosurgeon - Dr Barnett Abu  CT Chest x-ray - 04/08/22 EKG - 01/19/22 Stress Test - greater than 10 yrs ago per patient ECHO - 07/31/20 Cardiac Cath - 12/07/13  ICD Pacemaker/Loop - n/a  Sleep Study -  Yes CPAP - none.  Patient has a hypoglossal nerve stimulator right upper chest. Patient instructed to bring remote control device with him on DOS.  Hold Jardiance for 72 hours prior to procedure.  Last dose 04/09/22   Do not take Glipizide or Metformin on the morning of surgery.  If your blood sugar is less than 70 mg/dL, you will need to treat for low blood sugar: Treat a low blood sugar (less than 70 mg/dL) with  cup of clear juice (cranberry or apple), 4 glucose tablets, OR glucose gel. Recheck blood sugar in 15 minutes after treatment (to make sure it is greater than 70 mg/dL). If your blood sugar is not greater than 70 mg/dL on recheck, call [phone] for further instructions.  Anesthesia review: Yes  STOP now taking any Aspirin (unless otherwise instructed by your surgeon), Aleve, Naproxen, Ibuprofen, Motrin, Advil, Goody's, BC's, all herbal medications, fish oil, and all vitamins.   Coronavirus Screening Covid test on 04/09/22 at [facility]. Do you have any of the following symptoms:  Cough yes/no: No Fever (>100.18F)  yes/no: No Runny nose yes/no: No Sore throat yes/no: No Difficulty breathing/shortness of breath  yes/no: No  Have you traveled in the last 14 days and where? yes/no: No  Patient verbalized understanding of instructions that were given via phone.

## 2022-04-11 LAB — SPECIMEN STATUS REPORT

## 2022-04-11 LAB — NOVEL CORONAVIRUS, NAA: SARS-CoV-2, NAA: NOT DETECTED

## 2022-04-13 ENCOUNTER — Ambulatory Visit (HOSPITAL_BASED_OUTPATIENT_CLINIC_OR_DEPARTMENT_OTHER): Payer: PPO | Admitting: Vascular Surgery

## 2022-04-13 ENCOUNTER — Ambulatory Visit (HOSPITAL_COMMUNITY): Payer: PPO

## 2022-04-13 ENCOUNTER — Other Ambulatory Visit: Payer: Self-pay

## 2022-04-13 ENCOUNTER — Other Ambulatory Visit: Payer: Self-pay | Admitting: *Deleted

## 2022-04-13 ENCOUNTER — Encounter (HOSPITAL_COMMUNITY)
Admission: RE | Disposition: A | Discharge status: Home / Self Care | Payer: Self-pay | Source: Home / Self Care | Attending: Pulmonary Disease

## 2022-04-13 ENCOUNTER — Encounter (HOSPITAL_COMMUNITY): Payer: Self-pay | Admitting: Pulmonary Disease

## 2022-04-13 ENCOUNTER — Ambulatory Visit (HOSPITAL_COMMUNITY)
Admission: RE | Admit: 2022-04-13 | Discharge: 2022-04-13 | Disposition: A | Discharge status: Home / Self Care | Payer: PPO | Attending: Pulmonary Disease | Admitting: Pulmonary Disease

## 2022-04-13 ENCOUNTER — Ambulatory Visit (HOSPITAL_COMMUNITY): Payer: PPO | Admitting: Vascular Surgery

## 2022-04-13 DIAGNOSIS — I10 Essential (primary) hypertension: Secondary | ICD-10-CM | POA: Insufficient documentation

## 2022-04-13 DIAGNOSIS — R911 Solitary pulmonary nodule: Secondary | ICD-10-CM

## 2022-04-13 DIAGNOSIS — Z9889 Other specified postprocedural states: Secondary | ICD-10-CM | POA: Diagnosis not present

## 2022-04-13 DIAGNOSIS — Z87891 Personal history of nicotine dependence: Secondary | ICD-10-CM | POA: Insufficient documentation

## 2022-04-13 DIAGNOSIS — E119 Type 2 diabetes mellitus without complications: Secondary | ICD-10-CM | POA: Diagnosis not present

## 2022-04-13 DIAGNOSIS — G8929 Other chronic pain: Secondary | ICD-10-CM | POA: Diagnosis not present

## 2022-04-13 DIAGNOSIS — I2510 Atherosclerotic heart disease of native coronary artery without angina pectoris: Secondary | ICD-10-CM

## 2022-04-13 DIAGNOSIS — G4733 Obstructive sleep apnea (adult) (pediatric): Secondary | ICD-10-CM | POA: Diagnosis not present

## 2022-04-13 DIAGNOSIS — G4730 Sleep apnea, unspecified: Secondary | ICD-10-CM | POA: Diagnosis not present

## 2022-04-13 DIAGNOSIS — J439 Emphysema, unspecified: Secondary | ICD-10-CM | POA: Diagnosis not present

## 2022-04-13 DIAGNOSIS — I252 Old myocardial infarction: Secondary | ICD-10-CM | POA: Diagnosis not present

## 2022-04-13 DIAGNOSIS — C3411 Malignant neoplasm of upper lobe, right bronchus or lung: Secondary | ICD-10-CM | POA: Diagnosis not present

## 2022-04-13 DIAGNOSIS — J449 Chronic obstructive pulmonary disease, unspecified: Secondary | ICD-10-CM | POA: Diagnosis not present

## 2022-04-13 HISTORY — PX: VIDEO BRONCHOSCOPY WITH ENDOBRONCHIAL ULTRASOUND: SHX6177

## 2022-04-13 HISTORY — PX: FINE NEEDLE ASPIRATION: SHX5430

## 2022-04-13 HISTORY — PX: BRONCHIAL BIOPSY: SHX5109

## 2022-04-13 HISTORY — DX: Myoneural disorder, unspecified: G70.9

## 2022-04-13 HISTORY — PX: FIDUCIAL MARKER PLACEMENT: SHX6858

## 2022-04-13 HISTORY — PX: BRONCHIAL NEEDLE ASPIRATION BIOPSY: SHX5106

## 2022-04-13 HISTORY — PX: BRONCHIAL BRUSHINGS: SHX5108

## 2022-04-13 HISTORY — DX: Insomnia, unspecified: G47.00

## 2022-04-13 LAB — CBC
HCT: 47.6 % (ref 39.0–52.0)
Hemoglobin: 15.1 g/dL (ref 13.0–17.0)
MCH: 30.9 pg (ref 26.0–34.0)
MCHC: 31.7 g/dL (ref 30.0–36.0)
MCV: 97.5 fL (ref 80.0–100.0)
Platelets: 220 10*3/uL (ref 150–400)
RBC: 4.88 MIL/uL (ref 4.22–5.81)
RDW: 13.7 % (ref 11.5–15.5)
WBC: 8.5 10*3/uL (ref 4.0–10.5)
nRBC: 0 % (ref 0.0–0.2)

## 2022-04-13 LAB — COMPREHENSIVE METABOLIC PANEL
ALT: 17 U/L (ref 0–44)
AST: 19 U/L (ref 15–41)
Albumin: 3.9 g/dL (ref 3.5–5.0)
Alkaline Phosphatase: 71 U/L (ref 38–126)
Anion gap: 11 (ref 5–15)
BUN: 12 mg/dL (ref 8–23)
CO2: 25 mmol/L (ref 22–32)
Calcium: 9.3 mg/dL (ref 8.9–10.3)
Chloride: 100 mmol/L (ref 98–111)
Creatinine, Ser: 0.69 mg/dL (ref 0.61–1.24)
GFR, Estimated: >60 mL/min (ref >60)
Glucose, Bld: 175 mg/dL — ABNORMAL HIGH (ref 70–99)
Potassium: 4.6 mmol/L (ref 3.5–5.1)
Sodium: 136 mmol/L (ref 135–145)
Total Bilirubin: 0.6 mg/dL (ref 0.3–1.2)
Total Protein: 7 g/dL (ref 6.5–8.1)

## 2022-04-13 LAB — GLUCOSE, CAPILLARY
Glucose-Capillary: 191 mg/dL — ABNORMAL HIGH (ref 70–99)
Glucose-Capillary: 169 mg/dL — ABNORMAL HIGH (ref 70–99)

## 2022-04-13 SURGERY — BRONCHOSCOPY, WITH BIOPSY USING ELECTROMAGNETIC NAVIGATION
Anesthesia: General | Laterality: Right

## 2022-04-13 MED ORDER — ROCURONIUM BROMIDE 10 MG/ML (PF) SYRINGE
PREFILLED_SYRINGE | INTRAVENOUS | Status: DC | PRN
  Administered 2022-04-13: 60 mg via INTRAVENOUS

## 2022-04-13 MED ORDER — INSULIN ASPART 100 UNIT/ML IJ SOLN
INTRAMUSCULAR | Status: AC
  Filled 2022-04-13: qty 1

## 2022-04-13 MED ORDER — FENTANYL CITRATE (PF) 100 MCG/2ML IJ SOLN: 25.0000 ug | INTRAMUSCULAR | Status: DC | PRN

## 2022-04-13 MED ORDER — DEXAMETHASONE SODIUM PHOSPHATE 10 MG/ML IJ SOLN
INTRAMUSCULAR | Status: DC | PRN
  Administered 2022-04-13: 10 mg via INTRAVENOUS

## 2022-04-13 MED ORDER — ONDANSETRON HCL 4 MG/2ML IJ SOLN: 4.0000 mg | Freq: Once | INTRAMUSCULAR | Status: DC | PRN

## 2022-04-13 MED ORDER — PROPOFOL 10 MG/ML IV BOLUS
INTRAVENOUS | Status: DC | PRN
  Administered 2022-04-13: 150 mg via INTRAVENOUS

## 2022-04-13 MED ORDER — CHLORHEXIDINE GLUCONATE 0.12 % MT SOLN
OROMUCOSAL | Status: AC
  Filled 2022-04-13: qty 15

## 2022-04-13 MED ORDER — CHLORHEXIDINE GLUCONATE 0.12 % MT SOLN
15.0000 mL | Freq: Once | OROMUCOSAL | Status: AC
  Administered 2022-04-13: 15 mL via OROMUCOSAL
  Filled 2022-04-13: qty 15

## 2022-04-13 MED ORDER — LIDOCAINE 2% (20 MG/ML) 5 ML SYRINGE
INTRAMUSCULAR | Status: DC | PRN
  Administered 2022-04-13: 100 mg via INTRAVENOUS

## 2022-04-13 MED ORDER — INSULIN ASPART 100 UNIT/ML IJ SOLN
0.0000 [IU] | INTRAMUSCULAR | Status: DC | PRN
  Administered 2022-04-13: 2 [IU] via SUBCUTANEOUS

## 2022-04-13 MED ORDER — SUGAMMADEX SODIUM 200 MG/2ML IV SOLN
INTRAVENOUS | Status: DC | PRN
  Administered 2022-04-13: 400 mg via INTRAVENOUS

## 2022-04-13 MED ORDER — ONDANSETRON HCL 4 MG/2ML IJ SOLN
INTRAMUSCULAR | Status: DC | PRN
  Administered 2022-04-13: 4 mg via INTRAVENOUS

## 2022-04-13 MED ORDER — LACTATED RINGERS IV SOLN: INTRAVENOUS | Status: DC

## 2022-04-13 MED ORDER — FENTANYL CITRATE (PF) 100 MCG/2ML IJ SOLN
INTRAMUSCULAR | Status: DC | PRN
  Administered 2022-04-13: 100 ug via INTRAVENOUS

## 2022-04-13 SURGICAL SUPPLY — 30 items
BRUSH CYTOL CELLEBRITY 1.5X140 (MISCELLANEOUS) IMPLANT
CANISTER SUCT 3000ML PPV (MISCELLANEOUS) ×4 IMPLANT
CONT SPEC 4OZ CLIKSEAL STRL BL (MISCELLANEOUS) ×4 IMPLANT
COVER BACK TABLE 60X90IN (DRAPES) ×4 IMPLANT
COVER DOME SNAP 22 D (MISCELLANEOUS) ×4 IMPLANT
FORCEPS BIOP RJ4 1.8 (CUTTING FORCEPS) IMPLANT
GAUZE SPONGE 4X4 12PLY STRL (GAUZE/BANDAGES/DRESSINGS) ×4 IMPLANT
GLOVE BIO SURGEON STRL SZ7.5 (GLOVE) ×4 IMPLANT
GOWN STRL REUS W/ TWL LRG LVL3 (GOWN DISPOSABLE) ×4 IMPLANT
GOWN STRL REUS W/TWL LRG LVL3 (GOWN DISPOSABLE) ×3
KIT CLEAN ENDO COMPLIANCE (KITS) ×8 IMPLANT
KIT TURNOVER KIT B (KITS) ×4 IMPLANT
MARKER SKIN DUAL TIP RULER LAB (MISCELLANEOUS) ×4 IMPLANT
NDL EBUS SONO TIP PENTAX (NEEDLE) ×4 IMPLANT
NEEDLE EBUS SONO TIP PENTAX (NEEDLE) ×3 IMPLANT
NS IRRIG 1000ML POUR BTL (IV SOLUTION) ×4 IMPLANT
OIL SILICONE PENTAX (PARTS (SERVICE/REPAIRS)) ×4 IMPLANT
PAD ARMBOARD 7.5X6 YLW CONV (MISCELLANEOUS) ×8 IMPLANT
SOL ANTI FOG 6CC (MISCELLANEOUS) ×4 IMPLANT
SYR 20CC LL (SYRINGE) ×8 IMPLANT
SYR 20ML ECCENTRIC (SYRINGE) ×8 IMPLANT
SYR 50ML SLIP (SYRINGE) IMPLANT
SYR 5ML LUER SLIP (SYRINGE) ×4 IMPLANT
Superlock fiducial marker (Implant Marker) IMPLANT
TOWEL OR 17X24 6PK STRL BLUE (TOWEL DISPOSABLE) ×4 IMPLANT
TRAP SPECIMEN MUCOUS 40CC (MISCELLANEOUS) IMPLANT
TUBE CONNECTING 20X1/4 (TUBING) ×8 IMPLANT
UNDERPAD 30X30 (UNDERPADS AND DIAPERS) ×4 IMPLANT
VALVE DISPOSABLE (MISCELLANEOUS) ×4 IMPLANT
WATER STERILE IRR 1000ML POUR (IV SOLUTION) ×4 IMPLANT

## 2022-04-13 NOTE — Transfer of Care (Signed)
Immediate Anesthesia Transfer of Care Note  Patient: Howard Maxwell  Procedure(s) Performed: ROBOTIC ASSISTED NAVIGATIONAL BRONCHOSCOPY (Right) VIDEO BRONCHOSCOPY WITH ENDOBRONCHIAL ULTRASOUND (Bilateral) FIDUCIAL MARKER PLACEMENT BRONCHIAL BIOPSIES BRONCHIAL NEEDLE ASPIRATION BIOPSIES BRONCHIAL BRUSHINGS FINE NEEDLE ASPIRATION (FNA) LINEAR  Patient Location: PACU  Anesthesia Type:General  Level of Consciousness: drowsy and patient cooperative  Airway & Oxygen Therapy: Patient Spontanous Breathing and Patient connected to nasal cannula oxygen  Post-op Assessment: Report given to RN and Post -op Vital signs reviewed and stable  Post vital signs: Reviewed and stable  Last Vitals:  Vitals Value Taken Time  BP 132/65 04/13/22 1135  Temp    Pulse 55 04/13/22 1137  Resp 13 04/13/22 1137  SpO2 98 % 04/13/22 1137  Vitals shown include unvalidated device data.  Last Pain:  Vitals:   04/13/22 0936  TempSrc:   PainSc: 0-No pain         Complications: No notable events documented.

## 2022-04-13 NOTE — Op Note (Signed)
Video Bronchoscopy with Robotic Assisted Bronchoscopic Navigation  Video Bronchoscopy with Endobronchial Ultrasound Procedure Note  Date of Operation: 04/13/2022   Pre-op Diagnosis: Lung nodule, right upper lobe  Post-op Diagnosis: Lung nodule, right upper lobe  Surgeon: Josephine Igo, DO  Assistants: None   Anesthesia: General endotracheal anesthesia  Operation: Flexible video fiberoptic bronchoscopy with robotic assistance and biopsies.  Estimated Blood Loss: Minimal  Complications: None  Indications and History: Howard Maxwell is a 77 y.o. male with history of RUL lung nodule. The risks, benefits, complications, treatment options and expected outcomes were discussed with the patient.  The possibilities of pneumothorax, pneumonia, reaction to medication, pulmonary aspiration, perforation of a viscus, bleeding, failure to diagnose a condition and creating a complication requiring transfusion or operation were discussed with the patient who freely signed the consent.    Description of Procedure: The patient was seen in the Preoperative Area, was examined and was deemed appropriate to proceed.  The patient was taken to [facility] endoscopy room, 3, identified as Howard Maxwell and the procedure verified as Flexible Video Fiberoptic Bronchoscopy.  A Time Out was held and the above information confirmed.   Prior to the date of the procedure a high-resolution CT scan of the chest was performed. Utilizing ION software program a virtual tracheobronchial tree was generated to allow the creation of distinct navigation pathways to the patient's parenchymal abnormalities. After being taken to the operating room general anesthesia was initiated and the patient  was orally intubated. The video fiberoptic bronchoscope was introduced via the endotracheal tube and a general inspection was performed which showed normal right and left lung anatomy, aspiration of the bilateral mainstems was completed to remove  any remaining secretions. Robotic catheter inserted into patient's endotracheal tube.   Target #1 right upper lobe: The distinct navigation pathways prepared prior to this procedure were then utilized to navigate to patient's lesion identified on CT scan. The robotic catheter was secured into place and the vision probe was withdrawn.  Lesion location was approximated using fluoroscopy and three-dimensional cone beam CT imaging for peripheral targeting. Under fluoroscopic guidance transbronchial needle brushings, transbronchial needle biopsies, and transbronchial forceps biopsies were performed to be sent for cytology and pathology.  Following tissue biopsy a fiducial was placed within proximity of the lesion using the fiducial  Target #2 station 10 R: The video fiberoptic bronchoscope was introduced via the endotracheal tube and a general inspection was performed which showed normal right and left lung anatomy. The standard scope was then withdrawn and the endobronchial ultrasound was used to identify and characterize the peritracheal, hilar and bronchial lymph nodes. Inspection showed small 1 cm lymph node within the right hilum, normal-appearing connective tissue in the subcarinal space no obvious node. Using real-time ultrasound guidance Wang needle biopsies were take from Station 10 R nodes and were sent for cytology. The patient tolerated the procedure well without apparent complications. There was no significant blood loss. The bronchoscope was withdrawn.   At the end of the procedure a general airway inspection was performed and there was no evidence of active bleeding. The bronchoscope was removed.  The patient tolerated the procedure well. There was no significant blood loss and there were no obvious complications. A post-procedural chest x-ray is pending.  Samples Target #1: 1. Transbronchial needle brushings from right upper lobe 2. Transbronchial Wang needle biopsies from right upper lobe 3.  Transbronchial forceps biopsies from right upper lobe  Samples Target #2: 1. Wang needle biopsies from [employer]  node  Plans:  The patient will be discharged from the PACU to home when recovered from anesthesia and after chest x-ray is reviewed. We will review the cytology, pathology results with the patient when they become available. Outpatient followup will be with Josephine Igo, DO.  Josephine Igo, DO [facility] 04/13/2022 11:38 AM

## 2022-04-13 NOTE — Anesthesia Procedure Notes (Signed)
Procedure Name: Intubation Date/Time: 04/13/2022 10:40 AM  Performed by: Moshe Salisbury, CRNAPre-anesthesia Checklist: Patient identified, Emergency Drugs available, Suction available and Patient being monitored Patient Re-evaluated:Patient Re-evaluated prior to induction Oxygen Delivery Method: Circle System Utilized Preoxygenation: Pre-oxygenation with 100% oxygen Induction Type: IV induction Ventilation: Mask ventilation without difficulty Laryngoscope Size: Mac and 4 Grade View: Grade II Tube type: Oral Tube size: 8.5 mm Number of attempts: 1 Airway Equipment and Method: Stylet Placement Confirmation: ETT inserted through vocal cords under direct vision, positive ETCO2 and breath sounds checked- equal and bilateral Secured at: 22 cm Tube secured with: Tape Dental Injury: Teeth and Oropharynx as per pre-operative assessment

## 2022-04-13 NOTE — Interval H&P Note (Signed)
History and Physical Interval Note:  04/13/2022 9:09 AM  Howard Maxwell  has presented today for surgery, with the diagnosis of lung nodule.  The various methods of treatment have been discussed with the patient and family. After consideration of risks, benefits and other options for treatment, the patient has consented to  Procedure(s) with comments: ROBOTIC ASSISTED NAVIGATIONAL BRONCHOSCOPY (Right) - ion w/ cios VIDEO BRONCHOSCOPY WITH ENDOBRONCHIAL ULTRASOUND (Bilateral) as a surgical intervention.  The patient's history has been reviewed, patient examined, no change in status, stable for surgery.  I have reviewed the patient's chart and labs.  Questions were answered to the patient's satisfaction.     Rachel Bo Jemila Camille

## 2022-04-13 NOTE — Research (Signed)
Title: A multi-center, prospective, single-arm, observational study to evaluate real-world outcomes for the shape-sensing Ion endoluminal system  Primary Outcome: Evaluate procedure characteristics and short and long-term patient outcomes following shape-sensing robotic-assisted bronchoscopy (ssRAB) utilizing the Ion Endoluminal System for lung lesion localization or biopsy.   Protocol # / Study Name: ISI-ION-003 Clinical Trials #: [id number] Sponsor: [employer] Principal Investigator: Dr. Elige Radon Icard  Key Features of Ion Endoluminal System (referred to as "Ion") Ion is the first FDA cleared bronchoscopy system that uses fiber optic shape sensing technology to inform on location within the airways. Its catheter/tool channel has a smaller outer diameter (3.5 mm) in comparison to conventional bronchoscopes, allowing it to navigate into the smaller airways of the periphery.     Key Inclusion Criteria Subject is 18 years or older at the time of the procedure Subject is a candidate for a planned, elective RAB lung lesion localization or biopsy procedure in which the Ion Endoluminal System is planned to be utilized.  Subject  able to understand and adhere to study requirements and provide informed consent.   Key Exclusion Criteria Subject is under the care of a Museum/gallery exhibitions officer and is unable to provide informed consent on their own accord.  Subject is participating in an interventional research study or research study with investigational agents with an unknown safety profile that would interfere with participation or the results of this study.  Male subjects who are pregnant or nursing at the time of the index Ion procedure, as determined by standard site practices. Subjects that are incarcerated or institutionalized under court order, or other vulnerable populations.    Previous Clinical Trials Since receiving FDA clearance in Feb 2019, Ion has been adopted  commercially by over 226 centers in the [place], and utilized in over 40,000 procedures.  The first in-human study enrolled 67 subjects with a mean lesion size of 14.8 mm and the overall diagnostic yield was 79.3%, with no adverse events. 17 (58.6%) lesions were reported to have a bronchus sign available on CT imaging.  A multi-center study published results in 2022, with 270 lesions biopsied in 241 patients using Ion. The mean largest cardinal lesion size was 18.86.6mm, and the mean airway generation count was 7.01.6. Asymptomatic pneumothorax occurred in 3.3% of subjects, and 0.8% experienced airway bleeding.   Another study provided preliminary results in 2022, with 87% sensitivity for malignancy, a diagnostic yield of 81%, and a mean lesion size of 16 mm. 75% of biopsy cases were bronchus-sign negative. 4% of subjects experienced pneumothoraces (including those requiring intervention), and 0.8% of subjects experienced airway bleeding requiring wedging or balloon tamponade.  A single-center study captured 131 consecutive procedures of pulmonary biopsy using Ion. The navigational success rate was 98.7%, with an overall diagnostic yield of 81.7%, an overall complication rate of 3%, and a pneumothorax rate of 1.5%.    [facility] [place] Clinical Research Coordinator note:   This visit for Subject Howard Maxwell with DOB: Feb 26, 1946 on 04/13/2022 for the above protocol is Visit/Encounter # Pre-procedure, intra-procedure and post-procedure  and is for purpose of research.   The consent for this encounter is under:  Protocol Version 1.0 Investigator Brochure Version N/A Consent Version Revision A, dated 14Nov2023 and is currently IRB approved.   JASPER HANF expressed continued interest and consent in continuing as a study subject. Subject confirmed that there was no change in contact information (e.g. address, telephone, email). Subject thanked for participation in research and contribution  to science.  In this visit 04/13/2022 the subject will be evaluated by Principal Investigator named Dr. Tonia Brooms. This research coordinator has verified that the above investigator is up to date with his/her training logs.   The Subject was informed that the PI continues to have oversight of the subject's visits and course through relevant discussions, reviews, and also specifically of this visit by routing of this note to the PI.  The research study was discussed with the subject in the pre-operative room. The study was explained in detail including all the contents of the informed consent document. The subject was encouraged to ask questions. All questions were answered to their satisfaction. The IRB approved informed consent was signed, and a copy was given to the subject. After obtaining consent, the subject underwent scheduled procedure using the ion endoluminal system. Data collection was completed per protocol. Refer to paper source subject binder for further details.      Signed by  Verdene Lennert Clinical Research Coordinator / Sub-Investigator  [facility]  [employer], [place] 3:06 PM 04/13/2022

## 2022-04-13 NOTE — Anesthesia Postprocedure Evaluation (Signed)
Anesthesia Post Note  Patient: Glenard Haring  Procedure(s) Performed: ROBOTIC ASSISTED NAVIGATIONAL BRONCHOSCOPY (Right) VIDEO BRONCHOSCOPY WITH ENDOBRONCHIAL ULTRASOUND (Bilateral) FIDUCIAL MARKER PLACEMENT BRONCHIAL BIOPSIES BRONCHIAL NEEDLE ASPIRATION BIOPSIES BRONCHIAL BRUSHINGS FINE NEEDLE ASPIRATION (FNA) LINEAR     Patient location during evaluation: PACU Anesthesia Type: General Level of consciousness: awake and alert Pain management: pain level controlled Vital Signs Assessment: post-procedure vital signs reviewed and stable Respiratory status: spontaneous breathing, nonlabored ventilation, respiratory function stable and patient connected to nasal cannula oxygen Cardiovascular status: blood pressure returned to baseline and stable Postop Assessment: no apparent nausea or vomiting Anesthetic complications: no  No notable events documented.  Last Vitals:  Vitals:   04/13/22 1200 04/13/22 1215  BP: (!) 153/86 (!) 145/83  Pulse: (!) 54 (!) 59  Resp: 14 12  Temp:  36.7 C  SpO2: 95% 94%    Last Pain:  Vitals:   04/13/22 1215  TempSrc:   PainSc: 0-No pain                 Shontavia Mickel S

## 2022-04-14 ENCOUNTER — Encounter (HOSPITAL_COMMUNITY): Payer: Self-pay | Admitting: Pulmonary Disease

## 2022-04-14 ENCOUNTER — Telehealth: Payer: Self-pay | Admitting: Pulmonary Disease

## 2022-04-14 NOTE — Telephone Encounter (Signed)
PT just had surgery by Dr. Tonia Brooms. He was referred to a specific Dr. But when he got home he had a FU appt with Dr. Cliffton Asters (This is his preferred Dr. Because he is closer.). Wants to check w/Dr. Tonia Brooms to be sure he should keep that appt or change to the Dr. He recommended. That was [employer]. Mohammad.  [phone]
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
## 2022-04-15 ENCOUNTER — Ambulatory Visit (INDEPENDENT_AMBULATORY_CARE_PROVIDER_SITE_OTHER): Payer: PPO | Admitting: Podiatry

## 2022-04-15 ENCOUNTER — Encounter: Payer: Self-pay | Admitting: Podiatry

## 2022-04-15 VITALS — BP 140/74 | HR 57

## 2022-04-15 DIAGNOSIS — D2372 Other benign neoplasm of skin of left lower limb, including hip: Secondary | ICD-10-CM

## 2022-04-15 DIAGNOSIS — D2371 Other benign neoplasm of skin of right lower limb, including hip: Secondary | ICD-10-CM

## 2022-04-15 NOTE — Progress Notes (Signed)
He presents today chief complaint of painful calluses plantar aspect of the foot a couple elongated nails.  He goes on to state that he has been recently diagnosed with lung cancer.  Objective: Vital signs are stable he is alert and oriented x 3.  Pulses are palpable.  He has slightly elongated nails just slightly dystrophic no onychomycosis here.  He has benign skin lesion plantar aspect of the first metatarsal phalangeal joint bilateral right greater than left.  Assessment: History of type 2 diabetes noncomplicated at this point.  Painful elongated dystrophic nails and benign skin lesion.  Plan: Debrided nails.  Benign skin lesion.  Remember to ask him how he is doing with his lung cancer.

## 2022-04-15 NOTE — Telephone Encounter (Signed)
Called patient but he did not answer. Left message for him to call us back.  

## 2022-04-15 NOTE — Telephone Encounter (Signed)
PT ret call. Read Dr.'s notes to PT and sched FU based on his recom. PT expressed understanding. I sent a printed reminder by mail as well.

## 2022-04-15 NOTE — Telephone Encounter (Signed)
Called patient and he states that he recently had surgery done by [facility]. He states he had a follow up with Dr Kipp Brood and is wanting to know if this was ok or if he needed to follow up with the Dr that Dr Valeta Harms recommended. Patient states that Dr Kipp Brood was closer for him compared to his house. He states if he needs to follow up with the Dr that Dr Valeta Harms suggested he will call them to make that follow up. He is just wanting to be sure.  The other Dr that was recommended to his was  Inda Merlin??  Please advise sir

## 2022-04-16 ENCOUNTER — Ambulatory Visit (INDEPENDENT_AMBULATORY_CARE_PROVIDER_SITE_OTHER): Payer: PPO | Admitting: Pulmonary Disease

## 2022-04-16 DIAGNOSIS — J439 Emphysema, unspecified: Secondary | ICD-10-CM

## 2022-04-16 DIAGNOSIS — R911 Solitary pulmonary nodule: Secondary | ICD-10-CM

## 2022-04-16 LAB — PULMONARY FUNCTION TEST
DL/VA % pred: 98 %
DL/VA: 3.9 ml/min/mmHg/L
DLCO cor % pred: 84 %
DLCO cor: 21.18 ml/min/mmHg
DLCO unc % pred: 85 %
DLCO unc: 21.47 ml/min/mmHg
FEF 25-75 Post: 2.2 L/sec
FEF 25-75 Pre: 1.37 L/sec
FEF2575-%Change-Post: 61 %
FEF2575-%Pred-Post: 101 %
FEF2575-%Pred-Pre: 62 %
FEV1-%Change-Post: 11 %
FEV1-%Pred-Post: 74 %
FEV1-%Pred-Pre: 67 %
FEV1-Post: 2.27 L
FEV1-Pre: 2.03 L
FEV1FVC-%Change-Post: 5 %
FEV1FVC-%Pred-Pre: 100 %
FEV6-%Change-Post: 6 %
FEV6-%Pred-Post: 75 %
FEV6-%Pred-Pre: 70 %
FEV6-Post: 2.96 L
FEV6-Pre: 2.77 L
FEV6FVC-%Change-Post: 0 %
FEV6FVC-%Pred-Post: 106 %
FEV6FVC-%Pred-Pre: 105 %
FVC-%Change-Post: 6 %
FVC-%Pred-Post: 70 %
FVC-%Pred-Pre: 66 %
FVC-Post: 2.97 L
FVC-Pre: 2.8 L
Post FEV1/FVC ratio: 76 %
Post FEV6/FVC ratio: 100 %
Pre FEV1/FVC ratio: 72 %
Pre FEV6/FVC Ratio: 99 %
RV % pred: 120 %
RV: 3.11 L
TLC % pred: 85 %
TLC: 6.05 L

## 2022-04-16 LAB — CYTOLOGY - NON PAP

## 2022-04-16 NOTE — Progress Notes (Signed)
PFT done today. 

## 2022-04-17 ENCOUNTER — Other Ambulatory Visit: Payer: Self-pay | Admitting: *Deleted

## 2022-04-17 ENCOUNTER — Telehealth (HOSPITAL_COMMUNITY): Payer: Self-pay | Admitting: Radiology

## 2022-04-17 ENCOUNTER — Encounter (HOSPITAL_COMMUNITY): Payer: Self-pay | Admitting: Thoracic Surgery (Cardiothoracic Vascular Surgery)

## 2022-04-17 ENCOUNTER — Other Ambulatory Visit: Payer: Self-pay | Admitting: Thoracic Surgery (Cardiothoracic Vascular Surgery)

## 2022-04-17 ENCOUNTER — Institutional Professional Consult (permissible substitution): Payer: PPO | Admitting: Thoracic Surgery (Cardiothoracic Vascular Surgery)

## 2022-04-17 VITALS — BP 111/67 | HR 64 | Resp 20 | Ht 70.0 in | Wt 230.0 lb

## 2022-04-17 DIAGNOSIS — R911 Solitary pulmonary nodule: Secondary | ICD-10-CM

## 2022-04-17 NOTE — Telephone Encounter (Signed)
Patient given detailed instructions per Myocardial Perfusion Study Information Sheet for the test on 04/20/2022 at 13:00. Patient notified to arrive 15 minutes early and that it is imperative to arrive on time for appointment to keep from having the test rescheduled.  If you need to cancel or reschedule your appointment, please call the office within 24 hours of your appointment. . Patient verbalized understanding.EHK

## 2022-04-20 ENCOUNTER — Telehealth: Payer: Self-pay | Admitting: Pain Medicine

## 2022-04-20 ENCOUNTER — Ambulatory Visit (HOSPITAL_BASED_OUTPATIENT_CLINIC_OR_DEPARTMENT_OTHER): Payer: PPO

## 2022-04-20 DIAGNOSIS — R918 Other nonspecific abnormal finding of lung field: Secondary | ICD-10-CM | POA: Diagnosis present

## 2022-04-20 DIAGNOSIS — D62 Acute posthemorrhagic anemia: Secondary | ICD-10-CM | POA: Diagnosis not present

## 2022-04-20 DIAGNOSIS — N3281 Overactive bladder: Secondary | ICD-10-CM | POA: Diagnosis not present

## 2022-04-20 DIAGNOSIS — J939 Pneumothorax, unspecified: Secondary | ICD-10-CM | POA: Diagnosis not present

## 2022-04-20 DIAGNOSIS — Z79891 Long term (current) use of opiate analgesic: Secondary | ICD-10-CM | POA: Diagnosis not present

## 2022-04-20 DIAGNOSIS — Z800 Family history of malignant neoplasm of digestive organs: Secondary | ICD-10-CM | POA: Diagnosis not present

## 2022-04-20 DIAGNOSIS — I2510 Atherosclerotic heart disease of native coronary artery without angina pectoris: Secondary | ICD-10-CM | POA: Diagnosis present

## 2022-04-20 DIAGNOSIS — G4730 Sleep apnea, unspecified: Secondary | ICD-10-CM | POA: Diagnosis present

## 2022-04-20 DIAGNOSIS — I77810 Thoracic aortic ectasia: Secondary | ICD-10-CM | POA: Diagnosis present

## 2022-04-20 DIAGNOSIS — C3491 Malignant neoplasm of unspecified part of right bronchus or lung: Secondary | ICD-10-CM | POA: Diagnosis not present

## 2022-04-20 DIAGNOSIS — J929 Pleural plaque without asbestos: Secondary | ICD-10-CM | POA: Diagnosis not present

## 2022-04-20 DIAGNOSIS — J9811 Atelectasis: Secondary | ICD-10-CM | POA: Diagnosis not present

## 2022-04-20 DIAGNOSIS — C782 Secondary malignant neoplasm of pleura: Secondary | ICD-10-CM | POA: Diagnosis present

## 2022-04-20 DIAGNOSIS — Z8249 Family history of ischemic heart disease and other diseases of the circulatory system: Secondary | ICD-10-CM | POA: Diagnosis not present

## 2022-04-20 DIAGNOSIS — Z833 Family history of diabetes mellitus: Secondary | ICD-10-CM | POA: Diagnosis not present

## 2022-04-20 DIAGNOSIS — E119 Type 2 diabetes mellitus without complications: Secondary | ICD-10-CM | POA: Diagnosis not present

## 2022-04-20 DIAGNOSIS — E669 Obesity, unspecified: Secondary | ICD-10-CM | POA: Diagnosis present

## 2022-04-20 DIAGNOSIS — Z4422 Encounter for fitting and adjustment of artificial left eye: Secondary | ICD-10-CM | POA: Diagnosis not present

## 2022-04-20 DIAGNOSIS — I252 Old myocardial infarction: Secondary | ICD-10-CM | POA: Diagnosis not present

## 2022-04-20 DIAGNOSIS — R911 Solitary pulmonary nodule: Secondary | ICD-10-CM

## 2022-04-20 DIAGNOSIS — Z87891 Personal history of nicotine dependence: Secondary | ICD-10-CM | POA: Diagnosis not present

## 2022-04-20 DIAGNOSIS — I10 Essential (primary) hypertension: Secondary | ICD-10-CM | POA: Diagnosis present

## 2022-04-20 DIAGNOSIS — Z9001 Acquired absence of eye: Secondary | ICD-10-CM | POA: Diagnosis not present

## 2022-04-20 DIAGNOSIS — C3411 Malignant neoplasm of upper lobe, right bronchus or lung: Secondary | ICD-10-CM | POA: Diagnosis present

## 2022-04-20 DIAGNOSIS — J449 Chronic obstructive pulmonary disease, unspecified: Secondary | ICD-10-CM | POA: Diagnosis present

## 2022-04-20 DIAGNOSIS — J439 Emphysema, unspecified: Secondary | ICD-10-CM | POA: Diagnosis not present

## 2022-04-20 DIAGNOSIS — G4733 Obstructive sleep apnea (adult) (pediatric): Secondary | ICD-10-CM | POA: Diagnosis not present

## 2022-04-20 DIAGNOSIS — H547 Unspecified visual loss: Secondary | ICD-10-CM | POA: Diagnosis present

## 2022-04-20 DIAGNOSIS — Z01810 Encounter for preprocedural cardiovascular examination: Secondary | ICD-10-CM | POA: Insufficient documentation

## 2022-04-20 DIAGNOSIS — Z823 Family history of stroke: Secondary | ICD-10-CM | POA: Diagnosis not present

## 2022-04-20 DIAGNOSIS — Y839 Surgical procedure, unspecified as the cause of abnormal reaction of the patient, or of later complication, without mention of misadventure at the time of the procedure: Secondary | ICD-10-CM | POA: Diagnosis not present

## 2022-04-20 DIAGNOSIS — E1142 Type 2 diabetes mellitus with diabetic polyneuropathy: Secondary | ICD-10-CM | POA: Diagnosis present

## 2022-04-20 DIAGNOSIS — J9382 Other air leak: Secondary | ICD-10-CM | POA: Diagnosis not present

## 2022-04-20 DIAGNOSIS — J90 Pleural effusion, not elsewhere classified: Secondary | ICD-10-CM | POA: Diagnosis not present

## 2022-04-20 DIAGNOSIS — Z83719 Family history of colon polyps, unspecified: Secondary | ICD-10-CM | POA: Diagnosis not present

## 2022-04-20 DIAGNOSIS — Z1152 Encounter for screening for COVID-19: Secondary | ICD-10-CM | POA: Diagnosis not present

## 2022-04-20 DIAGNOSIS — Z01818 Encounter for other preprocedural examination: Secondary | ICD-10-CM | POA: Diagnosis not present

## 2022-04-20 DIAGNOSIS — I440 Atrioventricular block, first degree: Secondary | ICD-10-CM | POA: Diagnosis present

## 2022-04-20 LAB — MYOCARDIAL PERFUSION IMAGING
LV dias vol: 111 mL (ref 62–150)
LV sys vol: 36 mL
Nuc Stress EF: 68 %
Peak HR: 80 {beats}/min
Rest HR: 64 {beats}/min
Rest Nuclear Isotope Dose: 10.6 mCi
SDS: 0
SRS: 0
SSS: 0
ST Depression (mm): 0 mm
Stress Nuclear Isotope Dose: 31.7 mCi
TID: 1.32

## 2022-04-20 MED ORDER — REGADENOSON 0.4 MG/5ML IV SOLN
0.4000 mg | Freq: Once | INTRAVENOUS | Status: AC
  Administered 2022-04-20: 0.4 mg via INTRAVENOUS

## 2022-04-20 MED ORDER — TECHNETIUM TC 99M TETROFOSMIN IV KIT
31.7000 | PACK | Freq: Once | INTRAVENOUS | Status: AC | PRN
  Administered 2022-04-20: 31.7 via INTRAVENOUS

## 2022-04-20 MED ORDER — TECHNETIUM TC 99M TETROFOSMIN IV KIT
10.6000 | PACK | Freq: Once | INTRAVENOUS | Status: AC | PRN
  Administered 2022-04-20: 10.6 via INTRAVENOUS

## 2022-04-20 NOTE — Telephone Encounter (Signed)
PT sated that he has an procedure at [facility] on 04-23-22 and wants to make sure that the paper work he has is still correct to give to the doctor. Please give patient a call. Thanks

## 2022-04-20 NOTE — Telephone Encounter (Signed)
Spoke with patient and he was wanting to make sure that surgeons letter that he received for last procedure would be okay to use for upcoming procedure.  I told him that nothing has changed and it will be fine to use the one he has.

## 2022-04-20 NOTE — Pre-Procedure Instructions (Signed)
Surgical Instructions    Your procedure is scheduled on Thursday, April 23, 2022 at 7:30 AM.  Report to [facility] Main Entrance "A" at 5:30 A.M., then check in with the Admitting office.  Call this number if you have problems the morning of surgery:  [phone]   If you have any questions prior to your surgery date call [phone]: Open Monday-Friday 8am-4pm  *If you experience any cold or flu symptoms such as cough, fever, chills, shortness of breath, etc. between now and your scheduled surgery, please notify us.*    Remember:  Do not eat or drink after midnight the night before your surgery    Take these medicines the morning of surgery with A SIP OF WATER:  atorvastatin (LIPITOR)  Azelastine HCl (ASTEPRO)  carvedilol (COREG)  cetirizine (ZYRTEC)  fluticasone (FLONASE)  mirabegron ER (MYRBETRIQ)  morphine (MS CONTIN)  umeclidinium-vilanterol (ANORO ELLIPTA)   IF NEEDED: albuterol (VENTOLIN HFA)  Polyethyl Glycol-Propyl Glycol (SYSTANE)   Please bring all inhalers with you the day of surgery.    As of today, STOP taking any Aspirin (unless otherwise instructed by your surgeon) Aleve, Naproxen, Ibuprofen, Motrin, Advil, Goody's, BC's, all herbal medications, fish oil, and all vitamins.  WHAT DO I DO ABOUT MY DIABETES MEDICATION?   Do not take metFORMIN (GLUCOPHAGE) the morning of surgery.  STOP taking empagliflozin (JARDIANCE) 72 hours prior to surgery. Your last dose should be on 04/19/22.  Take your usual dose of glipiZIDE (GLUCOTROL XL) the morning before surgery (04/22/22). Do not take an evening dose on 04/22/22. Do not take glipiZIDE (GLUCOTROL XL) the morning of surgery.   HOW TO MANAGE YOUR DIABETES BEFORE AND AFTER SURGERY  Why is it important to control my blood sugar before and after surgery? Improving blood sugar levels before and after surgery helps healing and can limit problems. A way of improving blood sugar control is eating a healthy diet  by:  Eating less sugar and carbohydrates  Increasing activity/exercise  Talking with your doctor about reaching your blood sugar goals High blood sugars (greater than 180 mg/dL) can raise your risk of infections and slow your recovery, so you will need to focus on controlling your diabetes during the weeks before surgery. Make sure that the doctor who takes care of your diabetes knows about your planned surgery including the date and location.  How do I manage my blood sugar before surgery? Check your blood sugar at least 4 times a day, starting 2 days before surgery, to make sure that the level is not too high or low.  Check your blood sugar the morning of your surgery when you wake up and every 2 hours until you get to the Short Stay unit.  If your blood sugar is less than 70 mg/dL, you will need to treat for low blood sugar: Do not take insulin. Treat a low blood sugar (less than 70 mg/dL) with  cup of clear juice (cranberry or apple), 4 glucose tablets, OR glucose gel. Recheck blood sugar in 15 minutes after treatment (to make sure it is greater than 70 mg/dL). If your blood sugar is not greater than 70 mg/dL on recheck, call [phone] for further instructions. Report your blood sugar to the short stay nurse when you get to Short Stay.  If you are admitted to the hospital after surgery: Your blood sugar will be checked by the staff and you will probably be given insulin after surgery (instead of oral diabetes medicines) to make sure you  have good blood sugar levels. The goal for blood sugar control after surgery is 80-180 mg/dL.                      Do NOT Smoke (Tobacco/Vaping) for 24 hours prior to your procedure.  If you use a CPAP at night, you may bring your mask/headgear for your overnight stay.   Contacts, glasses, piercing's, hearing aid's, dentures or partials may not be worn into surgery, please bring cases for these belongings.    For patients admitted to the  hospital, discharge time will be determined by your treatment team.   Patients discharged the day of surgery will not be allowed to drive home, and someone needs to stay with them for 24 hours.  SURGICAL WAITING ROOM VISITATION Patients having surgery or a procedure may have two support people in the waiting area. Visitors may stay in the waiting area during the procedure and switch out with other visitors if needed. Only 1 support person is allowed in the pre-op area with the patient AFTER the patient is prepped. This person cannot be switched out. Children under the age of 7 must have an adult accompany them who is not the patient. If the patient needs to stay at the hospital during part of their recovery, the visitor guidelines for inpatient rooms apply.  Please refer to the [facility] website for the visitor guidelines for Inpatients (after your surgery is over and you are in a regular room).    Special instructions:   [place]- Preparing For Surgery  Before surgery, you can play an important role. Because skin is not sterile, your skin needs to be as free of germs as possible. You can reduce the number of germs on your skin by washing with CHG (chlorahexidine gluconate) Soap before surgery.  CHG is an antiseptic cleaner which kills germs and bonds with the skin to continue killing germs even after washing.    Oral Hygiene is also important to reduce your risk of infection.  Remember - BRUSH YOUR TEETH THE MORNING OF SURGERY WITH YOUR REGULAR TOOTHPASTE  Please do not use if you have an allergy to CHG or antibacterial soaps. If your skin becomes reddened/irritated stop using the CHG.  Do not shave (including legs and underarms) for at least 48 hours prior to first CHG shower. It is OK to shave your face.  Please follow these instructions carefully.   Shower the NIGHT BEFORE SURGERY and the MORNING OF SURGERY  If you chose to wash your hair, wash your hair first as usual with your  normal shampoo.  After you shampoo, rinse your hair and body thoroughly to remove the shampoo.  Use CHG Soap as you would any other liquid soap. You can apply CHG directly to the skin and wash gently with a scrungie or a clean washcloth.   Apply the CHG Soap to your body ONLY FROM THE NECK DOWN.  Do not use on open wounds or open sores. Avoid contact with your eyes, ears, mouth and genitals (private parts). Wash Face and genitals (private parts)  with your normal soap.   Wash thoroughly, paying special attention to the area where your surgery will be performed.  Thoroughly rinse your body with warm water from the neck down.  DO NOT shower/wash with your normal soap after using and rinsing off the CHG Soap.  Pat yourself dry with a CLEAN TOWEL.  Wear CLEAN PAJAMAS to bed the night before surgery  Place  CLEAN SHEETS on your bed the night before your surgery  DO NOT SLEEP WITH PETS.   Day of Surgery: Take a shower with CHG soap. Do not wear jewelry or makeup Do not wear lotions, powders, perfumes/colognes, or deodorant. Do not shave 48 hours prior to surgery.  Men may shave face and neck. Do not wear nail polish, gel polish, artificial nails, or any other type of covering on natural nails (fingers and toes) If you have artificial nails or gel coating that need to be removed by a nail salon, please have this removed prior to surgery. Artificial nails or gel coating may interfere with anesthesia's ability to adequately monitor your vital signs. Wear Clean/Comfortable clothing the morning of surgery Do not bring valuables to the hospital.  [facility] is not responsible for any belongings or valuables. Remember to brush your teeth WITH YOUR REGULAR TOOTHPASTE.   Please read over the following fact sheets that you were given.  If you received a COVID test during your pre-op visit  it is requested that you wear a mask when out in public, stay away from anyone that may not be feeling well  and notify your surgeon if you develop symptoms. If you have been in contact with anyone that has tested positive in the last 10 days please notify you surgeon.
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
## 2022-04-21 ENCOUNTER — Ambulatory Visit (HOSPITAL_COMMUNITY)
Admission: RE | Admit: 2022-04-21 | Discharge: 2022-04-21 | Disposition: A | Discharge status: Home / Self Care | Payer: PPO | Source: Ambulatory Visit | Attending: Thoracic Surgery (Cardiothoracic Vascular Surgery) | Admitting: Thoracic Surgery (Cardiothoracic Vascular Surgery)

## 2022-04-21 ENCOUNTER — Other Ambulatory Visit: Payer: Self-pay

## 2022-04-21 ENCOUNTER — Encounter (HOSPITAL_COMMUNITY)
Admission: RE | Admit: 2022-04-21 | Discharge: 2022-04-21 | Disposition: A | Discharge status: Home / Self Care | Payer: PPO | Source: Ambulatory Visit | Attending: Thoracic Surgery (Cardiothoracic Vascular Surgery) | Admitting: Thoracic Surgery (Cardiothoracic Vascular Surgery)

## 2022-04-21 ENCOUNTER — Encounter: Payer: Self-pay | Admitting: Urology

## 2022-04-21 ENCOUNTER — Ambulatory Visit (INDEPENDENT_AMBULATORY_CARE_PROVIDER_SITE_OTHER): Payer: PPO | Admitting: Urology

## 2022-04-21 ENCOUNTER — Encounter (HOSPITAL_COMMUNITY): Payer: Self-pay

## 2022-04-21 VITALS — BP 120/54 | HR 57 | Temp 98.1°F | Ht 70.0 in | Wt 232.1 lb

## 2022-04-21 VITALS — BP 114/74 | HR 76 | Ht 70.0 in | Wt 225.0 lb

## 2022-04-21 DIAGNOSIS — Z01818 Encounter for other preprocedural examination: Secondary | ICD-10-CM

## 2022-04-21 DIAGNOSIS — Z1152 Encounter for screening for COVID-19: Secondary | ICD-10-CM | POA: Insufficient documentation

## 2022-04-21 DIAGNOSIS — Z01812 Encounter for preprocedural laboratory examination: Secondary | ICD-10-CM | POA: Insufficient documentation

## 2022-04-21 DIAGNOSIS — E119 Type 2 diabetes mellitus without complications: Secondary | ICD-10-CM | POA: Insufficient documentation

## 2022-04-21 DIAGNOSIS — N3281 Overactive bladder: Secondary | ICD-10-CM | POA: Diagnosis not present

## 2022-04-21 DIAGNOSIS — R911 Solitary pulmonary nodule: Secondary | ICD-10-CM | POA: Insufficient documentation

## 2022-04-21 LAB — TYPE AND SCREEN
ABO/RH(D): O POS
Antibody Screen: NEGATIVE

## 2022-04-21 LAB — URINALYSIS, ROUTINE W REFLEX MICROSCOPIC
Bacteria, UA: NONE SEEN
Bilirubin Urine: NEGATIVE
Glucose, UA: 150 mg/dL — AB
Hgb urine dipstick: NEGATIVE
Ketones, ur: NEGATIVE mg/dL
Leukocytes,Ua: NEGATIVE
Nitrite: NEGATIVE
Protein, ur: NEGATIVE mg/dL
Specific Gravity, Urine: 1.021 (ref 1.005–1.030)
pH: 6 (ref 5.0–8.0)

## 2022-04-21 LAB — COMPREHENSIVE METABOLIC PANEL
ALT: 20 U/L (ref 0–44)
AST: 32 U/L (ref 15–41)
Albumin: 3.5 g/dL (ref 3.5–5.0)
Alkaline Phosphatase: 70 U/L (ref 38–126)
Anion gap: 12 (ref 5–15)
BUN: 15 mg/dL (ref 8–23)
CO2: 24 mmol/L (ref 22–32)
Calcium: 8.8 mg/dL — ABNORMAL LOW (ref 8.9–10.3)
Chloride: 99 mmol/L (ref 98–111)
Creatinine, Ser: 0.77 mg/dL (ref 0.61–1.24)
GFR, Estimated: >60 mL/min (ref >60)
Glucose, Bld: 147 mg/dL — ABNORMAL HIGH (ref 70–99)
Potassium: 4.7 mmol/L (ref 3.5–5.1)
Sodium: 135 mmol/L (ref 135–145)
Total Bilirubin: 1 mg/dL (ref 0.3–1.2)
Total Protein: 6.3 g/dL — ABNORMAL LOW (ref 6.5–8.1)

## 2022-04-21 LAB — CBC
HCT: 43.3 % (ref 39.0–52.0)
Hemoglobin: 13.8 g/dL (ref 13.0–17.0)
MCH: 30.8 pg (ref 26.0–34.0)
MCHC: 31.9 g/dL (ref 30.0–36.0)
MCV: 96.7 fL (ref 80.0–100.0)
Platelets: 184 10*3/uL (ref 150–400)
RBC: 4.48 MIL/uL (ref 4.22–5.81)
RDW: 13.6 % (ref 11.5–15.5)
WBC: 7.5 10*3/uL (ref 4.0–10.5)
nRBC: 0 % (ref 0.0–0.2)

## 2022-04-21 LAB — BLADDER SCAN AMB NON-IMAGING: Scan Result: 29

## 2022-04-21 LAB — PROTIME-INR
INR: 1 (ref 0.8–1.2)
Prothrombin Time: 13.4 seconds (ref 11.4–15.2)

## 2022-04-21 LAB — GLUCOSE, CAPILLARY: Glucose-Capillary: 168 mg/dL — ABNORMAL HIGH (ref 70–99)

## 2022-04-21 LAB — SURGICAL PCR SCREEN
MRSA, PCR: NEGATIVE
Staphylococcus aureus: POSITIVE — AB

## 2022-04-21 LAB — APTT: aPTT: 35 seconds (ref 24–36)

## 2022-04-21 NOTE — Progress Notes (Signed)
PCP - Dr. Catha Nottingham Cardiologist - Dr. Kathlyn [place] Urologist: Zara Council, PA-C Pulmonologist: Dr. Kara Mead  PPM/ICD - Denies  Chest x-ray - 04/21/22 EKG - 04/21/22 Stress Test - 04/20/22 ECHO - 07/31/20 Cardiac Cath - 12/07/13  Sleep Study - OSA, patient has an inspire implanted.  Patient uses 2L of O2 at night.  Fasting Blood Sugar - low 100's Checks Blood Sugar 4-5 times a day  Blood Thinner Instructions: N/A Aspirin Instructions: N/A  ERAS Protcol - No   COVID TEST- 04/21/22 in PAT   Anesthesia review: Yes. Cardiac hx  Patient denies shortness of breath, fever, cough and chest pain at PAT appointment   All instructions explained to the patient, with a verbal understanding of the material. Patient agrees to go over the instructions while at home for a better understanding. Patient also instructed to self quarantine after being tested for COVID-19. The opportunity to ask questions was provided.

## 2022-04-22 ENCOUNTER — Encounter: Payer: Self-pay | Admitting: Vascular Surgery

## 2022-04-22 ENCOUNTER — Ambulatory Visit: Payer: PPO | Admitting: *Deleted

## 2022-04-22 DIAGNOSIS — E1165 Type 2 diabetes mellitus with hyperglycemia: Secondary | ICD-10-CM

## 2022-04-22 DIAGNOSIS — L97521 Non-pressure chronic ulcer of other part of left foot limited to breakdown of skin: Secondary | ICD-10-CM

## 2022-04-22 LAB — HEMOGLOBIN A1C
Hgb A1c MFr Bld: 7.1 % — ABNORMAL HIGH (ref 4.8–5.6)
Mean Plasma Glucose: 157.07 mg/dL

## 2022-04-22 LAB — SARS CORONAVIRUS 2 (TAT 6-24 HRS): SARS Coronavirus 2: NEGATIVE

## 2022-04-22 NOTE — Progress Notes (Unsigned)
Patient presents to the office today for diabetic shoe and insole measuring.  Patient was measured with brannock device to determine size and width for 1 pair of extra depth shoes and foam casted for 3 pair of insoles.   ABN signed.   Documentation of medical necessity will be sent to patient's treating diabetic doctor to verify and sign. HTA authorization will be submitted once the CMN is received.   Patient's diabetic provider: Azzie Glatter, MD    NPI: [id number]   Shoes and insoles will be ordered at that time and patient will be notified for an appointment for fitting when they arrive.   Patient shoe selection-   1st   Shoe choice:   [id number]  Shoe size ordered: 10.5 W

## 2022-04-22 NOTE — Anesthesia Preprocedure Evaluation (Deleted)
Anesthesia Evaluation    Airway        Dental   Pulmonary former smoker          Cardiovascular hypertension,      Neuro/Psych    GI/Hepatic   Endo/Other  diabetes    Renal/GU      Musculoskeletal   Abdominal   Peds  Hematology   Anesthesia Other Findings   Reproductive/Obstetrics                             Anesthesia Physical Anesthesia Plan  ASA:   Anesthesia Plan:    Post-op Pain Management:    Induction:   PONV Risk Score and Plan:   Airway Management Planned:   Additional Equipment:   Intra-op Plan:   Post-operative Plan:   Informed Consent:   Plan Discussed with:   Anesthesia Plan Comments: (PAT note written 04/22/2022 by Myra Gianotti, PA-C.  )       Anesthesia Quick Evaluation
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
## 2022-04-23 ENCOUNTER — Inpatient Hospital Stay (HOSPITAL_COMMUNITY): Payer: PPO | Admitting: Physician Assistant

## 2022-04-23 ENCOUNTER — Other Ambulatory Visit: Payer: Self-pay

## 2022-04-23 ENCOUNTER — Inpatient Hospital Stay (HOSPITAL_COMMUNITY): Payer: PPO

## 2022-04-23 ENCOUNTER — Inpatient Hospital Stay (HOSPITAL_COMMUNITY)
Admission: RE | Admit: 2022-04-23 | Discharge: 2022-04-27 | DRG: 164 | Disposition: A | Discharge status: Home / Self Care | Payer: PPO | Attending: Thoracic Surgery (Cardiothoracic Vascular Surgery) | Admitting: Thoracic Surgery (Cardiothoracic Vascular Surgery)

## 2022-04-23 ENCOUNTER — Encounter (HOSPITAL_COMMUNITY): Payer: Self-pay | Admitting: Thoracic Surgery (Cardiothoracic Vascular Surgery)

## 2022-04-23 ENCOUNTER — Telehealth: Payer: Self-pay | Admitting: Pulmonary Disease

## 2022-04-23 ENCOUNTER — Ambulatory Visit: Payer: PPO | Admitting: Pulmonary Disease

## 2022-04-23 ENCOUNTER — Encounter (HOSPITAL_COMMUNITY)
Admission: RE | Disposition: A | Discharge status: Home / Self Care | Payer: Self-pay | Source: Home / Self Care | Attending: Thoracic Surgery (Cardiothoracic Vascular Surgery)

## 2022-04-23 DIAGNOSIS — Z823 Family history of stroke: Secondary | ICD-10-CM | POA: Diagnosis not present

## 2022-04-23 DIAGNOSIS — E119 Type 2 diabetes mellitus without complications: Secondary | ICD-10-CM | POA: Diagnosis not present

## 2022-04-23 DIAGNOSIS — I10 Essential (primary) hypertension: Secondary | ICD-10-CM

## 2022-04-23 DIAGNOSIS — Z8249 Family history of ischemic heart disease and other diseases of the circulatory system: Secondary | ICD-10-CM | POA: Diagnosis not present

## 2022-04-23 DIAGNOSIS — J449 Chronic obstructive pulmonary disease, unspecified: Secondary | ICD-10-CM | POA: Diagnosis present

## 2022-04-23 DIAGNOSIS — G4730 Sleep apnea, unspecified: Secondary | ICD-10-CM | POA: Diagnosis present

## 2022-04-23 DIAGNOSIS — Z902 Acquired absence of lung [part of]: Secondary | ICD-10-CM

## 2022-04-23 DIAGNOSIS — Z9001 Acquired absence of eye: Secondary | ICD-10-CM

## 2022-04-23 DIAGNOSIS — Z1152 Encounter for screening for COVID-19: Secondary | ICD-10-CM | POA: Diagnosis not present

## 2022-04-23 DIAGNOSIS — K219 Gastro-esophageal reflux disease without esophagitis: Secondary | ICD-10-CM | POA: Diagnosis present

## 2022-04-23 DIAGNOSIS — Z01812 Encounter for preprocedural laboratory examination: Secondary | ICD-10-CM

## 2022-04-23 DIAGNOSIS — D62 Acute posthemorrhagic anemia: Secondary | ICD-10-CM | POA: Diagnosis not present

## 2022-04-23 DIAGNOSIS — Z9049 Acquired absence of other specified parts of digestive tract: Secondary | ICD-10-CM

## 2022-04-23 DIAGNOSIS — Z7984 Long term (current) use of oral hypoglycemic drugs: Secondary | ICD-10-CM

## 2022-04-23 DIAGNOSIS — Y839 Surgical procedure, unspecified as the cause of abnormal reaction of the patient, or of later complication, without mention of misadventure at the time of the procedure: Secondary | ICD-10-CM | POA: Diagnosis not present

## 2022-04-23 DIAGNOSIS — Z800 Family history of malignant neoplasm of digestive organs: Secondary | ICD-10-CM

## 2022-04-23 DIAGNOSIS — R918 Other nonspecific abnormal finding of lung field: Principal | ICD-10-CM | POA: Diagnosis present

## 2022-04-23 DIAGNOSIS — H547 Unspecified visual loss: Secondary | ICD-10-CM | POA: Diagnosis present

## 2022-04-23 DIAGNOSIS — C3491 Malignant neoplasm of unspecified part of right bronchus or lung: Secondary | ICD-10-CM

## 2022-04-23 DIAGNOSIS — I493 Ventricular premature depolarization: Secondary | ICD-10-CM | POA: Diagnosis present

## 2022-04-23 DIAGNOSIS — C782 Secondary malignant neoplasm of pleura: Secondary | ICD-10-CM | POA: Diagnosis present

## 2022-04-23 DIAGNOSIS — E1142 Type 2 diabetes mellitus with diabetic polyneuropathy: Secondary | ICD-10-CM | POA: Diagnosis present

## 2022-04-23 DIAGNOSIS — Z83719 Family history of colon polyps, unspecified: Secondary | ICD-10-CM | POA: Diagnosis not present

## 2022-04-23 DIAGNOSIS — R42 Dizziness and giddiness: Secondary | ICD-10-CM | POA: Diagnosis present

## 2022-04-23 DIAGNOSIS — I440 Atrioventricular block, first degree: Secondary | ICD-10-CM | POA: Diagnosis present

## 2022-04-23 DIAGNOSIS — R911 Solitary pulmonary nodule: Secondary | ICD-10-CM

## 2022-04-23 DIAGNOSIS — Z8711 Personal history of peptic ulcer disease: Secondary | ICD-10-CM

## 2022-04-23 DIAGNOSIS — Z833 Family history of diabetes mellitus: Secondary | ICD-10-CM

## 2022-04-23 DIAGNOSIS — I77810 Thoracic aortic ectasia: Secondary | ICD-10-CM | POA: Diagnosis present

## 2022-04-23 DIAGNOSIS — J9382 Other air leak: Secondary | ICD-10-CM | POA: Diagnosis not present

## 2022-04-23 DIAGNOSIS — Z87891 Personal history of nicotine dependence: Secondary | ICD-10-CM

## 2022-04-23 DIAGNOSIS — Z79899 Other long term (current) drug therapy: Secondary | ICD-10-CM

## 2022-04-23 DIAGNOSIS — Z79891 Long term (current) use of opiate analgesic: Secondary | ICD-10-CM

## 2022-04-23 DIAGNOSIS — Z961 Presence of intraocular lens: Secondary | ICD-10-CM | POA: Diagnosis present

## 2022-04-23 DIAGNOSIS — Z888 Allergy status to other drugs, medicaments and biological substances status: Secondary | ICD-10-CM

## 2022-04-23 DIAGNOSIS — C3411 Malignant neoplasm of upper lobe, right bronchus or lung: Secondary | ICD-10-CM | POA: Diagnosis present

## 2022-04-23 DIAGNOSIS — I252 Old myocardial infarction: Secondary | ICD-10-CM

## 2022-04-23 DIAGNOSIS — H5462 Unqualified visual loss, left eye, normal vision right eye: Secondary | ICD-10-CM | POA: Diagnosis present

## 2022-04-23 DIAGNOSIS — G8929 Other chronic pain: Secondary | ICD-10-CM | POA: Diagnosis present

## 2022-04-23 DIAGNOSIS — J9811 Atelectasis: Secondary | ICD-10-CM | POA: Diagnosis not present

## 2022-04-23 DIAGNOSIS — Z9841 Cataract extraction status, right eye: Secondary | ICD-10-CM

## 2022-04-23 DIAGNOSIS — I2510 Atherosclerotic heart disease of native coronary artery without angina pectoris: Secondary | ICD-10-CM | POA: Diagnosis present

## 2022-04-23 DIAGNOSIS — Z9981 Dependence on supplemental oxygen: Secondary | ICD-10-CM

## 2022-04-23 DIAGNOSIS — T8182XA Emphysema (subcutaneous) resulting from a procedure, initial encounter: Secondary | ICD-10-CM | POA: Diagnosis not present

## 2022-04-23 DIAGNOSIS — G4733 Obstructive sleep apnea (adult) (pediatric): Secondary | ICD-10-CM | POA: Diagnosis present

## 2022-04-23 DIAGNOSIS — E669 Obesity, unspecified: Secondary | ICD-10-CM | POA: Diagnosis present

## 2022-04-23 DIAGNOSIS — Z981 Arthrodesis status: Secondary | ICD-10-CM

## 2022-04-23 DIAGNOSIS — M549 Dorsalgia, unspecified: Secondary | ICD-10-CM | POA: Diagnosis present

## 2022-04-23 HISTORY — PX: NODE DISSECTION: SHX5269

## 2022-04-23 HISTORY — PX: INTERCOSTAL NERVE BLOCK: SHX5021

## 2022-04-23 LAB — GLUCOSE, CAPILLARY
Glucose-Capillary: 166 mg/dL — ABNORMAL HIGH (ref 70–99)
Glucose-Capillary: 219 mg/dL — ABNORMAL HIGH (ref 70–99)
Glucose-Capillary: 227 mg/dL — ABNORMAL HIGH (ref 70–99)
Glucose-Capillary: 227 mg/dL — ABNORMAL HIGH (ref 70–99)
Glucose-Capillary: 197 mg/dL — ABNORMAL HIGH (ref 70–99)
Glucose-Capillary: 303 mg/dL — ABNORMAL HIGH (ref 70–99)

## 2022-04-23 SURGERY — LOBECTOMY, LUNG, ROBOT-ASSISTED, USING VATS
Anesthesia: General | Site: Chest | Laterality: Right

## 2022-04-23 MED ORDER — FENTANYL CITRATE (PF) 250 MCG/5ML IJ SOLN
INTRAMUSCULAR | Status: DC | PRN
  Administered 2022-04-23 (×2): 50 ug via INTRAVENOUS
  Administered 2022-04-23: 100 ug via INTRAVENOUS
  Administered 2022-04-23: 50 ug via INTRAVENOUS

## 2022-04-23 MED ORDER — DEXAMETHASONE SODIUM PHOSPHATE 10 MG/ML IJ SOLN
INTRAMUSCULAR | Status: AC
  Filled 2022-04-23: qty 1

## 2022-04-23 MED ORDER — ALBUTEROL SULFATE (2.5 MG/3ML) 0.083% IN NEBU: 2.5000 mg | INHALATION_SOLUTION | Freq: Four times a day (QID) | RESPIRATORY_TRACT | Status: DC | PRN

## 2022-04-23 MED ORDER — GUAIFENESIN 200 MG PO TABS
400.0000 mg | ORAL_TABLET | Freq: Two times a day (BID) | ORAL | Status: DC
  Administered 2022-04-23 – 2022-04-27 (×8): 400 mg via ORAL
  Filled 2022-04-23 (×9): qty 2

## 2022-04-23 MED ORDER — ACETAMINOPHEN 10 MG/ML IV SOLN
INTRAVENOUS | Status: DC | PRN
  Administered 2022-04-23: 1000 mg via INTRAVENOUS

## 2022-04-23 MED ORDER — SPIRONOLACTONE 25 MG PO TABS
25.0000 mg | ORAL_TABLET | Freq: Every day | ORAL | Status: DC
  Administered 2022-04-24 – 2022-04-27 (×4): 25 mg via ORAL
  Filled 2022-04-23 (×4): qty 1

## 2022-04-23 MED ORDER — DEXAMETHASONE SODIUM PHOSPHATE 10 MG/ML IJ SOLN
INTRAMUSCULAR | Status: DC | PRN
  Administered 2022-04-23: 5 mg via INTRAVENOUS

## 2022-04-23 MED ORDER — KETOROLAC TROMETHAMINE 15 MG/ML IJ SOLN
INTRAMUSCULAR | Status: AC
  Filled 2022-04-23: qty 1

## 2022-04-23 MED ORDER — PHENYLEPHRINE HCL-NACL 20-0.9 MG/250ML-% IV SOLN
INTRAVENOUS | Status: DC | PRN
  Administered 2022-04-23: 40 ug/min via INTRAVENOUS

## 2022-04-23 MED ORDER — CEFAZOLIN SODIUM-DEXTROSE 2-4 GM/100ML-% IV SOLN
2.0000 g | Freq: Three times a day (TID) | INTRAVENOUS | Status: AC
  Administered 2022-04-23 – 2022-04-24 (×2): 2 g via INTRAVENOUS

## 2022-04-23 MED ORDER — SUGAMMADEX SODIUM 200 MG/2ML IV SOLN
INTRAVENOUS | Status: DC | PRN
  Administered 2022-04-23: 300 mg via INTRAVENOUS

## 2022-04-23 MED ORDER — CARVEDILOL 6.25 MG PO TABS
6.2500 mg | ORAL_TABLET | Freq: Two times a day (BID) | ORAL | Status: DC
  Administered 2022-04-23 – 2022-04-27 (×8): 6.25 mg via ORAL
  Filled 2022-04-23 (×9): qty 1

## 2022-04-23 MED ORDER — LOSARTAN POTASSIUM 50 MG PO TABS
50.0000 mg | ORAL_TABLET | Freq: Every day | ORAL | Status: DC
  Administered 2022-04-24 – 2022-04-27 (×3): 50 mg via ORAL
  Filled 2022-04-23 (×4): qty 1

## 2022-04-23 MED ORDER — TRAMADOL HCL 50 MG PO TABS: 50.0000 mg | ORAL_TABLET | Freq: Four times a day (QID) | ORAL | Status: DC | PRN

## 2022-04-23 MED ORDER — LORATADINE 10 MG PO TABS: 10.0000 mg | ORAL_TABLET | Freq: Every day | ORAL | Status: DC

## 2022-04-23 MED ORDER — ACETAMINOPHEN 500 MG PO TABS
ORAL_TABLET | ORAL | Status: AC
  Filled 2022-04-23: qty 2

## 2022-04-23 MED ORDER — SODIUM CHLORIDE FLUSH 0.9 % IV SOLN
INTRAVENOUS | Status: DC | PRN
  Administered 2022-04-23: 100 mL

## 2022-04-23 MED ORDER — LIDOCAINE 2% (20 MG/ML) 5 ML SYRINGE
INTRAMUSCULAR | Status: DC | PRN
  Administered 2022-04-23: 70 mg via INTRAVENOUS

## 2022-04-23 MED ORDER — ONDANSETRON HCL 4 MG/2ML IJ SOLN: 4.0000 mg | Freq: Four times a day (QID) | INTRAMUSCULAR | Status: DC | PRN

## 2022-04-23 MED ORDER — LACTATED RINGERS IV SOLN: INTRAVENOUS | Status: DC

## 2022-04-23 MED ORDER — ATORVASTATIN CALCIUM 40 MG PO TABS
40.0000 mg | ORAL_TABLET | Freq: Every day | ORAL | Status: DC
  Administered 2022-04-24 – 2022-04-27 (×4): 40 mg via ORAL
  Filled 2022-04-23 (×4): qty 1

## 2022-04-23 MED ORDER — ONDANSETRON HCL 4 MG/2ML IJ SOLN
INTRAMUSCULAR | Status: DC | PRN
  Administered 2022-04-23: 4 mg via INTRAVENOUS

## 2022-04-23 MED ORDER — OXYCODONE HCL 5 MG/5ML PO SOLN: 5.0000 mg | Freq: Once | ORAL | Status: DC | PRN

## 2022-04-23 MED ORDER — PROPOFOL 10 MG/ML IV BOLUS
INTRAVENOUS | Status: AC
  Filled 2022-04-23: qty 20

## 2022-04-23 MED ORDER — ROCURONIUM BROMIDE 10 MG/ML (PF) SYRINGE
PREFILLED_SYRINGE | INTRAVENOUS | Status: AC
  Filled 2022-04-23: qty 20

## 2022-04-23 MED ORDER — BUPIVACAINE LIPOSOME 1.3 % IJ SUSP
INTRAMUSCULAR | Status: AC
  Filled 2022-04-23: qty 20

## 2022-04-23 MED ORDER — BISACODYL 5 MG PO TBEC
10.0000 mg | DELAYED_RELEASE_TABLET | Freq: Every day | ORAL | Status: DC
  Administered 2022-04-24 – 2022-04-27 (×4): 10 mg via ORAL
  Filled 2022-04-23 (×4): qty 2

## 2022-04-23 MED ORDER — FLUTICASONE PROPIONATE 50 MCG/ACT NA SUSP
1.0000 | Freq: Every day | NASAL | Status: DC
  Administered 2022-04-24 – 2022-04-27 (×3): 1 via NASAL
  Filled 2022-04-23: qty 16

## 2022-04-23 MED ORDER — METFORMIN HCL 850 MG PO TABS
850.0000 mg | ORAL_TABLET | Freq: Two times a day (BID) | ORAL | Status: DC
  Administered 2022-04-24 – 2022-04-27 (×7): 850 mg via ORAL
  Filled 2022-04-23 (×8): qty 1

## 2022-04-23 MED ORDER — AZELASTINE HCL 0.1 % NA SOLN: 1.0000 | Freq: Every day | NASAL | Status: DC | PRN

## 2022-04-23 MED ORDER — OMEGA-3-ACID ETHYL ESTERS 1 G PO CAPS
1.0000 g | ORAL_CAPSULE | Freq: Every day | ORAL | Status: DC
  Administered 2022-04-24 – 2022-04-27 (×4): 1 g via ORAL
  Filled 2022-04-23 (×4): qty 1

## 2022-04-23 MED ORDER — ENOXAPARIN SODIUM 40 MG/0.4ML IJ SOSY
40.0000 mg | PREFILLED_SYRINGE | Freq: Every day | INTRAMUSCULAR | Status: DC
  Administered 2022-04-24: 40 mg via SUBCUTANEOUS
  Filled 2022-04-23 (×3): qty 0.4

## 2022-04-23 MED ORDER — FENTANYL CITRATE (PF) 100 MCG/2ML IJ SOLN
25.0000 ug | INTRAMUSCULAR | Status: DC | PRN
  Administered 2022-04-23 (×2): 25 ug via INTRAVENOUS

## 2022-04-23 MED ORDER — ORAL CARE MOUTH RINSE: 15.0000 mL | Freq: Once | OROMUCOSAL | Status: AC

## 2022-04-23 MED ORDER — CEFAZOLIN SODIUM-DEXTROSE 2-4 GM/100ML-% IV SOLN
INTRAVENOUS | Status: AC
  Filled 2022-04-23: qty 100

## 2022-04-23 MED ORDER — ACETAMINOPHEN 500 MG PO TABS
1000.0000 mg | ORAL_TABLET | Freq: Four times a day (QID) | ORAL | Status: DC
  Administered 2022-04-23 – 2022-04-27 (×14): 1000 mg via ORAL
  Filled 2022-04-23 (×13): qty 2

## 2022-04-23 MED ORDER — CHLORHEXIDINE GLUCONATE 0.12 % MT SOLN
15.0000 mL | Freq: Once | OROMUCOSAL | Status: AC
  Administered 2022-04-23: 15 mL via OROMUCOSAL
  Filled 2022-04-23: qty 15

## 2022-04-23 MED ORDER — FENTANYL CITRATE (PF) 250 MCG/5ML IJ SOLN
INTRAMUSCULAR | Status: AC
  Filled 2022-04-23: qty 5

## 2022-04-23 MED ORDER — UMECLIDINIUM-VILANTEROL 62.5-25 MCG/ACT IN AEPB
1.0000 | INHALATION_SPRAY | Freq: Every day | RESPIRATORY_TRACT | Status: DC
  Administered 2022-04-24 – 2022-04-27 (×3): 1 via RESPIRATORY_TRACT
  Filled 2022-04-23: qty 14

## 2022-04-23 MED ORDER — GLIPIZIDE ER 10 MG PO TB24
10.0000 mg | ORAL_TABLET | Freq: Every day | ORAL | Status: DC
  Administered 2022-04-24 – 2022-04-27 (×3): 10 mg via ORAL
  Filled 2022-04-23 (×4): qty 1

## 2022-04-23 MED ORDER — CETIRIZINE HCL 10 MG PO TABS
10.0000 mg | ORAL_TABLET | Freq: Every day | ORAL | Status: DC
  Administered 2022-04-24 – 2022-04-27 (×4): 10 mg via ORAL
  Filled 2022-04-23 (×4): qty 1

## 2022-04-23 MED ORDER — PANTOPRAZOLE SODIUM 40 MG PO TBEC
40.0000 mg | DELAYED_RELEASE_TABLET | Freq: Every day | ORAL | Status: DC
  Administered 2022-04-24 – 2022-04-27 (×4): 40 mg via ORAL
  Filled 2022-04-23 (×4): qty 1

## 2022-04-23 MED ORDER — INSULIN ASPART 100 UNIT/ML IJ SOLN
0.0000 [IU] | Freq: Four times a day (QID) | INTRAMUSCULAR | Status: DC
  Administered 2022-04-23: 10 [IU] via SUBCUTANEOUS
  Administered 2022-04-23: 4 [IU] via SUBCUTANEOUS
  Administered 2022-04-24: 8 [IU] via SUBCUTANEOUS
  Administered 2022-04-24: 4 [IU] via SUBCUTANEOUS

## 2022-04-23 MED ORDER — OXYCODONE HCL 5 MG PO TABS: 5.0000 mg | ORAL_TABLET | Freq: Once | ORAL | Status: DC | PRN

## 2022-04-23 MED ORDER — SENNOSIDES-DOCUSATE SODIUM 8.6-50 MG PO TABS
1.0000 | ORAL_TABLET | Freq: Every day | ORAL | Status: DC
  Administered 2022-04-23 – 2022-04-26 (×4): 1 via ORAL
  Filled 2022-04-23 (×4): qty 1

## 2022-04-23 MED ORDER — BUPIVACAINE HCL (PF) 0.5 % IJ SOLN
INTRAMUSCULAR | Status: AC
  Filled 2022-04-23: qty 30

## 2022-04-23 MED ORDER — INSULIN ASPART 100 UNIT/ML IJ SOLN
0.0000 [IU] | INTRAMUSCULAR | Status: AC | PRN
  Administered 2022-04-23: 3 [IU] via SUBCUTANEOUS
  Administered 2022-04-23: 2 [IU] via SUBCUTANEOUS

## 2022-04-23 MED ORDER — ONDANSETRON HCL 4 MG/2ML IJ SOLN
INTRAMUSCULAR | Status: AC
  Filled 2022-04-23: qty 2

## 2022-04-23 MED ORDER — PROPOFOL 10 MG/ML IV BOLUS
INTRAVENOUS | Status: DC | PRN
  Administered 2022-04-23: 200 mg via INTRAVENOUS

## 2022-04-23 MED ORDER — CEFAZOLIN SODIUM-DEXTROSE 2-4 GM/100ML-% IV SOLN
2.0000 g | INTRAVENOUS | Status: AC
  Administered 2022-04-23: 2 g via INTRAVENOUS
  Filled 2022-04-23: qty 100

## 2022-04-23 MED ORDER — MORPHINE SULFATE ER 15 MG PO TBCR
15.0000 mg | EXTENDED_RELEASE_TABLET | Freq: Two times a day (BID) | ORAL | Status: DC
  Administered 2022-04-23 – 2022-04-27 (×9): 15 mg via ORAL
  Filled 2022-04-23 (×9): qty 1

## 2022-04-23 MED ORDER — EMPAGLIFLOZIN 10 MG PO TABS
10.0000 mg | ORAL_TABLET | Freq: Every day | ORAL | Status: DC
  Administered 2022-04-24 – 2022-04-27 (×4): 10 mg via ORAL
  Filled 2022-04-23 (×4): qty 1

## 2022-04-23 MED ORDER — ROCURONIUM BROMIDE 10 MG/ML (PF) SYRINGE
PREFILLED_SYRINGE | INTRAVENOUS | Status: DC | PRN
  Administered 2022-04-23: 40 mg via INTRAVENOUS
  Administered 2022-04-23: 20 mg via INTRAVENOUS
  Administered 2022-04-23: 50 mg via INTRAVENOUS
  Administered 2022-04-23: 30 mg via INTRAVENOUS

## 2022-04-23 MED ORDER — FENTANYL CITRATE (PF) 100 MCG/2ML IJ SOLN
INTRAMUSCULAR | Status: AC
  Filled 2022-04-23: qty 2

## 2022-04-23 MED ORDER — MORPHINE SULFATE (PF) 2 MG/ML IV SOLN: 2.0000 mg | INTRAVENOUS | Status: DC | PRN

## 2022-04-23 MED ORDER — 0.9 % SODIUM CHLORIDE (POUR BTL) OPTIME
TOPICAL | Status: DC | PRN
  Administered 2022-04-23: 2000 mL

## 2022-04-23 MED ORDER — ACETAMINOPHEN 160 MG/5ML PO SOLN: 1000.0000 mg | Freq: Four times a day (QID) | ORAL | Status: DC

## 2022-04-23 MED ORDER — KETOROLAC TROMETHAMINE 15 MG/ML IJ SOLN
15.0000 mg | Freq: Four times a day (QID) | INTRAMUSCULAR | Status: AC
  Administered 2022-04-23 – 2022-04-25 (×7): 15 mg via INTRAVENOUS
  Filled 2022-04-23 (×6): qty 1

## 2022-04-23 MED ORDER — LIDOCAINE 2% (20 MG/ML) 5 ML SYRINGE
INTRAMUSCULAR | Status: AC
  Filled 2022-04-23: qty 5

## 2022-04-23 SURGICAL SUPPLY — 109 items
ADH SKN CLS APL DERMABOND .7 (GAUZE/BANDAGES/DRESSINGS) ×1
ADH SKN CLS LQ APL DERMABOND (GAUZE/BANDAGES/DRESSINGS) ×1
APL PRP STRL LF DISP 70% ISPRP (MISCELLANEOUS) ×2
BAG SPEC RTRVL C1550 15 (MISCELLANEOUS) ×1
BLADE CLIPPER SURG (BLADE) ×2 IMPLANT
BLADE SURG 11 STRL SS (BLADE) ×2 IMPLANT
CANISTER SUCT 3000ML PPV (MISCELLANEOUS) ×4 IMPLANT
CANNULA REDUC XI 12-8 STAPL (CANNULA) ×2
CANNULA REDUCER 12-8 DVNC XI (CANNULA) ×4 IMPLANT
CATH ROBINSON RED A/P 18FR (CATHETERS) IMPLANT
CATH THORACIC 28FR (CATHETERS) ×2 IMPLANT
CHLORAPREP W/TINT 26 (MISCELLANEOUS) ×2 IMPLANT
CLIP VESOCCLUDE MED 6/CT (CLIP) IMPLANT
CNTNR URN SCR LID CUP LEK RST (MISCELLANEOUS) ×10 IMPLANT
CONN ST 1/4X3/8  BEN (MISCELLANEOUS)
CONN ST 1/4X3/8 BEN (MISCELLANEOUS) IMPLANT
CONT SPEC 4OZ STRL OR WHT (MISCELLANEOUS) ×9
DEFOGGER SCOPE WARMER CLEARIFY (MISCELLANEOUS) ×2 IMPLANT
DERMABOND ADVANCED .7 DNX12 (GAUZE/BANDAGES/DRESSINGS) ×2 IMPLANT
DERMABOND ADVANCED .7 DNX6 (GAUZE/BANDAGES/DRESSINGS) IMPLANT
DRAIN CHANNEL 28F RND 3/8 FF (WOUND CARE) IMPLANT
DRAPE ARM DVNC X/XI (DISPOSABLE) ×8 IMPLANT
DRAPE COLUMN DVNC XI (DISPOSABLE) ×2 IMPLANT
DRAPE CV SPLIT W-CLR ANES SCRN (DRAPES) ×2 IMPLANT
DRAPE DA VINCI XI ARM (DISPOSABLE) ×4
DRAPE DA VINCI XI COLUMN (DISPOSABLE) ×1
DRAPE ORTHO SPLIT 77X108 STRL (DRAPES) ×1
DRAPE SURG ORHT 6 SPLT 77X108 (DRAPES) ×2 IMPLANT
ELECT BLADE 6.5 EXT (BLADE) IMPLANT
ELECT REM PT RETURN 9FT ADLT (ELECTROSURGICAL) ×1
ELECTRODE REM PT RTRN 9FT ADLT (ELECTROSURGICAL) ×2 IMPLANT
GAUZE KITTNER 4X5 RF (MISCELLANEOUS) IMPLANT
GAUZE KITTNER 4X8 (MISCELLANEOUS) ×2 IMPLANT
GAUZE SPONGE 4X4 12PLY STRL (GAUZE/BANDAGES/DRESSINGS) ×2 IMPLANT
GLOVE BIO SURGEON STRL SZ7.5 (GLOVE) ×4 IMPLANT
GLOVE SURG POLYISO LF SZ8 (GLOVE) ×2 IMPLANT
GOWN STRL REUS W/ TWL LRG LVL3 (GOWN DISPOSABLE) ×4 IMPLANT
GOWN STRL REUS W/ TWL XL LVL3 (GOWN DISPOSABLE) ×4 IMPLANT
GOWN STRL REUS W/TWL 2XL LVL3 (GOWN DISPOSABLE) ×2 IMPLANT
GOWN STRL REUS W/TWL LRG LVL3 (GOWN DISPOSABLE) ×5
GOWN STRL REUS W/TWL XL LVL3 (GOWN DISPOSABLE) ×2
HEMOSTAT SURGICEL 2X14 (HEMOSTASIS) ×6 IMPLANT
IRRIGATION STRYKERFLOW (MISCELLANEOUS) IMPLANT
IRRIGATOR STRYKERFLOW (MISCELLANEOUS)
KIT BASIN OR (CUSTOM PROCEDURE TRAY) ×2 IMPLANT
KIT TURNOVER KIT B (KITS) ×2 IMPLANT
NDL 22X1.5 STRL (OR ONLY) (MISCELLANEOUS) ×2 IMPLANT
NEEDLE 22X1.5 STRL (OR ONLY) (MISCELLANEOUS) ×1 IMPLANT
NS IRRIG 1000ML POUR BTL (IV SOLUTION) ×6 IMPLANT
PACK CHEST (CUSTOM PROCEDURE TRAY) ×2 IMPLANT
PAD ARMBOARD 7.5X6 YLW CONV (MISCELLANEOUS) ×10 IMPLANT
RELOAD STAPLE 45 2.0 GRY DVNC (STAPLE) IMPLANT
RELOAD STAPLE 45 2.5 WHT DVNC (STAPLE) IMPLANT
RELOAD STAPLE 45 3.5 BLU DVNC (STAPLE) IMPLANT
RELOAD STAPLE 45 4.3 GRN DVNC (STAPLE) IMPLANT
RELOAD STAPLER 2.5X45 WHT DVNC (STAPLE) ×3 IMPLANT
RELOAD STAPLER 3.5X45 BLU DVNC (STAPLE) ×4 IMPLANT
RELOAD STAPLER 4.3X45 GRN DVNC (STAPLE) ×1 IMPLANT
SCISSORS LAP 5X35 DISP (ENDOMECHANICALS) IMPLANT
SEAL CANN UNIV 5-8 DVNC XI (MISCELLANEOUS) ×4 IMPLANT
SEAL XI 5MM-8MM UNIVERSAL (MISCELLANEOUS) ×2
SEALANT PROGEL (MISCELLANEOUS) IMPLANT
SEALER LIGASURE MARYLAND 30 (ELECTROSURGICAL) IMPLANT
SET TRI-LUMEN FLTR TB AIRSEAL (TUBING) ×2 IMPLANT
SOLUTION ELECTROLUBE (MISCELLANEOUS) IMPLANT
SPONGE INTESTINAL PEANUT (DISPOSABLE) IMPLANT
SPONGE TONSIL TAPE 1 RFD (DISPOSABLE) IMPLANT
STAPLE RELOAD 45 2.0 GRAY (STAPLE) ×1
STAPLE RELOAD 45 2.0 GRAY DVNC (STAPLE) ×1 IMPLANT
STAPLER 45 SUREFORM CVD (STAPLE) ×1
STAPLER 45 SUREFORM CVD DVNC (STAPLE) IMPLANT
STAPLER CANNULA SEAL DVNC XI (STAPLE) ×4 IMPLANT
STAPLER CANNULA SEAL XI (STAPLE) ×2
STAPLER RELOAD 2.5X45 WHITE (STAPLE) ×3
STAPLER RELOAD 2.5X45 WHT DVNC (STAPLE) ×3
STAPLER RELOAD 3.5X45 BLU DVNC (STAPLE) ×4
STAPLER RELOAD 3.5X45 BLUE (STAPLE) ×4
STAPLER RELOAD 4.3X45 GREEN (STAPLE) ×1
STAPLER RELOAD 4.3X45 GRN DVNC (STAPLE) ×1
STOPCOCK 4 WAY LG BORE MALE ST (IV SETS) ×2 IMPLANT
SUT MNCRL AB 3-0 PS2 18 (SUTURE) IMPLANT
SUT MON AB 2-0 CT1 36 (SUTURE) IMPLANT
SUT PDS AB 1 CTX 36 (SUTURE) IMPLANT
SUT PROLENE 4 0 RB 1 (SUTURE)
SUT PROLENE 4-0 RB1 .5 CRCL 36 (SUTURE) IMPLANT
SUT SILK  1 MH (SUTURE) ×1
SUT SILK 1 MH (SUTURE) ×2 IMPLANT
SUT SILK 1 TIES 10X30 (SUTURE) IMPLANT
SUT SILK 2 0 SH (SUTURE) IMPLANT
SUT SILK 2 0SH CR/8 30 (SUTURE) IMPLANT
SUT VIC AB 1 CTX 36 (SUTURE)
SUT VIC AB 1 CTX36XBRD ANBCTR (SUTURE) IMPLANT
SUT VIC AB 2-0 CT1 27 (SUTURE) ×1
SUT VIC AB 2-0 CT1 TAPERPNT 27 (SUTURE) ×2 IMPLANT
SUT VIC AB 3-0 SH 27 (SUTURE) ×2
SUT VIC AB 3-0 SH 27X BRD (SUTURE) ×4 IMPLANT
SUT VICRYL 0 TIES 12 18 (SUTURE) ×2 IMPLANT
SUT VICRYL 0 UR6 27IN ABS (SUTURE) ×4 IMPLANT
SUT VICRYL 2 TP 1 (SUTURE) IMPLANT
SYR 10ML LL (SYRINGE) ×2 IMPLANT
SYR 20ML LL LF (SYRINGE) ×2 IMPLANT
SYR 50ML LL SCALE MARK (SYRINGE) ×2 IMPLANT
SYSTEM RETRIEVAL ANCHOR 15 (MISCELLANEOUS) IMPLANT
SYSTEM SAHARA CHEST DRAIN ATS (WOUND CARE) ×2 IMPLANT
TAPE CLOTH 4X10 WHT NS (GAUZE/BANDAGES/DRESSINGS) ×2 IMPLANT
TIP APPLICATOR SPRAY EXTEND 16 (VASCULAR PRODUCTS) IMPLANT
TOWEL GREEN STERILE (TOWEL DISPOSABLE) ×2 IMPLANT
TRAY FOLEY MTR SLVR 16FR STAT (SET/KITS/TRAYS/PACK) ×2 IMPLANT
TUBING EXTENTION W/L.L. (IV SETS) ×2 IMPLANT

## 2022-04-23 NOTE — Discharge Summary (Signed)
Physician Discharge Summary       [address]       [place] [address]             [phone]    Patient ID: Howard Maxwell MRN: [id number] DOB/AGE: [birth year] 77 y.o.  Admit date: 04/23/2022 Discharge date: 04/27/2022  Admission Diagnoses: Lung mass Non-small cell lung cancer   Discharge Diagnoses:  Principal Problem:   Lung mass  Non-small cell lung cancer Active Problems:   S/P lobectomy of lung  Consults: None  Treatment: 04/23/2022   Patient:  Howard Maxwell Pre-Op Dx: RUL NSCLC   Post-op Dx:  same Procedure: - Robotic assisted right video thoracoscopy - Right upper lobectomy - Mediastinal lymph node sampling - Intercostal nerve block   Surgeon and Role:      * Lightfoot, Lucile Crater, MD - Primary  History of Present Illness:     Howard Maxwell is 77 y.o. male who presented for surgical evaluation biopsy-proven non-small cell lung cancer. He has a significant smoking history. His cancer was initially picked up through the lung cancer screening program. He has no complaints. He denies any shortness of breath or coughs. His weight has been stable and he has no neurologic symptoms.    Dr. Kipp Brood reviewed the patient's diagnostic studies and determined surgical intervention would provide him the best long term treatment. He discussed the patient's treatment options as well as the risks and benefits of surgery. Howard Maxwell was agreeable to proceed with surgery.   Hospital course:  Howard Maxwell presented to [facility] and was taken to the operating room on 04/23/2022. He underwent a robotic assisted right upper lobectomy with lymph node dissection. He tolerated the procedure well and was taken to the postanesthesia care unit in stable condition. His right sided chest tube was placed to water seal. On POD1 he had a large air leak with cough. CXR showed no pneumothorax. His air leak resolved but CT output remained too high to remove. His  oxygen saturations were dropping at rest and with ambulation. He had normal work of breathing and was in no distress. He qualified for home oxygen. He was requiring home oxygen at nighttime prior to the surgery. Home oxygen for daytime use was ordered. CT output decreased and chest tube was removed on 04/27/22. Follow up CXR showed stable small air-fluid level and no pneumothorax following chest tube removal. Pulmonary hygiene was encouraged. He was ambulating independently without shortness of breath and his pain was well controlled. His incisions were healing well without sign of infection.  He was medically stable for discharge home.    Latest Vital Signs: Blood pressure (!) 143/68, pulse 65, temperature 98.2 F (36.8 C), temperature source Oral, resp. rate 19, height 5\' 10"  (1.778 m), weight 107.9 kg, SpO2 97 %.  Physical Exam: General appearance: alert, cooperative, and no distress Neurologic: intact Heart: Regular rate and rhythm, no murmur Lungs: clear to auscultation bilaterally Abdomen: soft, nontender Extremities: extremities normal, atraumatic, no cyanosis or edema Wound: Clean and dry dressing over chest tube site. Incisions clean and dry, no erythema or sign of infection.  Discharge Condition: Stable  Recent laboratory studies:  Lab Results  Component Value Date   WBC 9.6 04/25/2022   HGB 12.6 (L) 04/25/2022   HCT 38.2 (L) 04/25/2022   MCV 93.9 04/25/2022   PLT 142 (L) 04/25/2022   Lab Results  Component Value Date   NA 129 (L) 04/25/2022   K 4.0  04/25/2022   CL 96 (L) 04/25/2022   CO2 26 04/25/2022   CREATININE 1.02 04/25/2022   GLUCOSE 152 (H) 04/25/2022      Diagnostic Studies: DG Chest 2 View  Result Date: 04/27/2022 CLINICAL DATA:  Chest tube removal.  Evaluate for pneumothorax. EXAM: CHEST - 2 VIEW COMPARISON:  04/27/2022. FINDINGS: Trachea is midline. Heart is enlarged, stable. Inspire device projects over the right chest. Persistent air fluid level in the  upper right hemithorax. Right upper lobectomy. Streaky bibasilar volume loss. Tiny bilateral pleural effusions. Subcutaneous emphysema along the lower right lateral chest wall. IMPRESSION: 1. Small air-fluid level in the upper right hemithorax, suggesting loculated pleural fluid and air, as seen on exam earlier the same day, prior chest tube removal. Otherwise, no pneumothorax related to chest tube removal. 2. Bibasilar streaky volume loss. 3. Tiny bilateral pleural effusions. Electronically Signed   By: Lorin Picket M.D.   On: 04/27/2022 13:00   DG CHEST PORT 1 VIEW  Result Date: 04/27/2022 CLINICAL DATA:  Right upper lobectomy. EXAM: PORTABLE CHEST 1 VIEW COMPARISON:  04/26/2022 and CT chest 04/08/2022. FINDINGS: Trachea is deviated slightly to the right, as before. Heart is enlarged. Right chest tube terminates at the apex of the right hemithorax. Inspire device projects over the right chest. Right upper lobectomy. No definite pneumothorax. There may be a small amount of loculated pleural fluid in the upper right hemithorax. Bibasilar streaky atelectasis. Small bilateral pleural effusions. Subcutaneous emphysema along the lower right chest wall. IMPRESSION: 1. No definite pneumothorax with right chest tube in place. 2. Small bilateral pleural effusions. There may be a small amount of loculated pleural fluid in the upper right hemithorax. 3. Bibasilar atelectasis. Electronically Signed   By: Lorin Picket M.D.   On: 04/27/2022 08:15   DG CHEST PORT 1 VIEW  Result Date: 04/26/2022 CLINICAL DATA:  Status post lobectomy.  Pneumothorax. EXAM: PORTABLE CHEST 1 VIEW COMPARISON:  04/25/2022 FINDINGS: Right chest tube remains in place without evidence for right-sided pneumothorax. Similar appearance of subcutaneous emphysema in the region of the chest tube entry site. The cardio pericardial silhouette is enlarged. Interstitial markings are diffusely coarsened with chronic features. Streaky opacity at the bases  compatible with atelectasis with stable well-defined rounded opacity at the right base. IMPRESSION: 1. No substantial interval change. Right chest tube remains in place without evidence for pneumothorax. 2. Chronic interstitial changes with bibasilar atelectasis. Electronically Signed   By: Misty Stanley M.D.   On: 04/26/2022 05:59   DG CHEST PORT 1 VIEW  Result Date: 04/25/2022 CLINICAL DATA:  Pneumothorax EXAM: PORTABLE CHEST 1 VIEW COMPARISON:  Yesterday FINDINGS: Right chest tube with tip at the apex.  No visible pneumothorax. Indistinct density at the lung bases is stable and likely atelectasis. Unchanged rounded opacity over the right diaphragm. Generator pack over the right chest. Chronic cardiomegaly. IMPRESSION: No visible pneumothorax.  Stable chest tube positioning. Electronically Signed   By: Jorje Guild M.D.   On: 04/25/2022 08:35   DG Chest Port 1 View  Result Date: 04/24/2022 CLINICAL DATA:  Postoperative for right upper lobectomy EXAM: PORTABLE CHEST 1 VIEW COMPARISON:  04/23/2022 FINDINGS: Right chest tube in place, terminating at the right hemithoracic apex. No visible pneumothorax. Pulse generator projects over the right chest with leads extending up into the right neck. Lower cervical spine postoperative findings are unchanged. Trace amount of subcutaneous emphysema on the right. Moderate enlargement of the cardiopericardial silhouette with indistinct pulmonary vasculature suggesting pulmonary venous hypertension. Mild scarring  or atelectasis at the right lung base and in the right perihilar region. Mild midthoracic pleural thickening laterally on the right side, unchanged. IMPRESSION: 1. Right chest tube in place, without pneumothorax. 2. Moderate enlargement of the cardiopericardial silhouette with indistinct pulmonary vasculature suggesting pulmonary venous hypertension. 3. Mild scarring or atelectasis in the right perihilar region and right lung base. 4. Mild right lateral  midthoracic pleural thickening. Electronically Signed   By: Van Clines M.D.   On: 04/24/2022 07:34   DG Chest Port 1 View  Result Date: 04/23/2022 CLINICAL DATA:  S/p right upper lobectomy EXAM: PORTABLE CHEST 1 VIEW COMPARISON:  CXR 04/21/22 FINDINGS: Right-sided battery pack in place for CNS stimulator device. Partially visualized cervical spinal fusion hardware in place. Postsurgical changes from interval right upper lobectomy. Right-sided thoracostomy tube in place which is positioned in the right lung apex. No pneumothorax. No pleural effusion. There is small amount of subcutaneous emphysema at the chest tube insertion site. Low lung volumes. Apparent enlargement of the cardiac and mediastinal contours is slightly secondary to decreased lung volumes. No radiographically apparent displaced rib fractures. Degenerative changes of the bilateral AC joints. IMPRESSION: 1. Postsurgical changes from interval right upper lobectomy with a right-sided thoracostomy tube in place. No pneumothorax. 2. Low lung volumes. Apparent enlargement of the cardiac and mediastinal contours is likely secondary to decreased lung volumes. Electronically Signed   By: Marin Roberts M.D.   On: 04/23/2022 11:42   DG Chest 2 View  Result Date: 04/22/2022 CLINICAL DATA:  Preoperative chest exam.  History of lung nodule. EXAM: CHEST - 2 VIEW COMPARISON:  04/13/2022. FINDINGS: Heart is enlarged and the mediastinal contour is within normal limits. There is hyperinflation of the lungs. No consolidation, effusion, or pneumothorax. Cervical and lumbar spinal fusion hardware is present. A neurostimulator device with leads coursing in the cervical soft tissues on the right. IMPRESSION: No active cardiopulmonary disease. Electronically Signed   By: Brett Fairy M.D.   On: 04/22/2022 03:35   Myocardial Perfusion Imaging  Result Date: 04/20/2022   The study is normal. The study is low risk.   No ST deviation was noted.   Left  ventricular function is normal. Nuclear stress EF: 68 %. The left ventricular ejection fraction is hyperdynamic (>65%). End diastolic cavity size is mildly enlarged.   Prior study not available for comparison.   DG Chest Port 1 View  Result Date: 04/13/2022 CLINICAL DATA:  Status post bronchoscopy with biopsy. EXAM: PORTABLE CHEST 1 VIEW COMPARISON:  04/08/2022 FINDINGS: Cervical spine fixation. Midline trachea. Mild cardiomegaly. No pleural effusion or pneumothorax. Diffuse peribronchial thickening. The right upper lobe lung nodule is not readily apparent by plain film. Deep brain stimulator. No lobar consolidation. IMPRESSION: No pneumothorax or other complication after bronchoscopy. Electronically Signed   By: Abigail Miyamoto M.D.   On: 04/13/2022 12:11   DG C-ARM BRONCHOSCOPY  Result Date: 04/13/2022 C-ARM BRONCHOSCOPY: Fluoroscopy was utilized by the requesting physician.  No radiographic interpretation.   CT Super D Chest Wo Contrast  Result Date: 04/09/2022 CLINICAL DATA:  Lung nodule, former smoker, quit 2015 with 71 pack-year history. * Tracking Code: BO * EXAM: CT CHEST WITHOUT CONTRAST TECHNIQUE: Multidetector CT imaging of the chest was performed using thin slice collimation for electromagnetic bronchoscopy planning purposes, without intravenous contrast. RADIATION DOSE REDUCTION: This exam was performed according to the departmental dose-optimization program which includes automated exposure control, adjustment of the mA and/or kV according to patient size and/or use of iterative reconstruction technique.  COMPARISON:  03/30/2022, 11/05/2021, 09/24/2020, 04/15/2016 and PET 12/24/2021. FINDINGS: Cardiovascular: Atherosclerotic calcification of the aorta, aortic valve and coronary arteries. Enlarged pulmonic trunk. Heart is at the upper limits of normal in size. No pericardial effusion. Ascending aorta measures at the upper limits of normal in size, 3.9 cm. Mediastinum/Nodes: No pathologically  enlarged mediastinal or axillary lymph nodes. Hilar regions are difficult to definitively evaluate without IV contrast. Esophagus is grossly unremarkable. Lungs/Pleura: Centrilobular emphysema. Irregular nodule in the lateral aspect of the posterior segment right upper lobe measures 0.9 x 1.7 cm (3/59), enlarged from 11/05/2021, at which time it measured 5 x 9 mm. On 09/24/2020, it measured 4 mm and is new from 04/15/2016. Subpleural scarring inferiorly within the posterior segment right upper lobe. Additional pleuroparenchymal scarring in the right middle lobe. No pleural fluid. Airway is unremarkable. Upper Abdomen: Liver margin is slightly irregular. Subcentimeter low-attenuation lesion in the left hepatic lobe, too small to characterize. Visualized portions of the liver, adrenal glands, left kidney, spleen, pancreas and stomach are otherwise grossly unremarkable. No upper abdominal adenopathy. Musculoskeletal: Degenerative and postoperative changes in the spine. Flowing anterior osteophytosis in the thoracic spine. IMPRESSION: 1. Enlarging irregular right upper lobe nodule, highly worrisome for indolent adenocarcinoma. 2. Liver appears mildly cirrhotic. 3. Aortic atherosclerosis (ICD10-I70.0). Coronary artery calcification. 4. Enlarged pulmonic trunk, indicative of pulmonary arterial hypertension. 5.  Emphysema (ICD10-J43.9). Electronically Signed   By: Lorin Picket M.D.   On: 04/09/2022 12:28   CT CHEST LCS NODULE F/U LOW DOSE WO CONTRAST  Result Date: 03/31/2022 CLINICAL DATA:  77 year old male former smoker (quit in 2015) with 71 pack-year history of smoking. Follow-up for prior abnormal low-dose lung cancer screening examination. EXAM: CT CHEST WITHOUT CONTRAST FOR LUNG CANCER SCREENING NODULE FOLLOW-UP TECHNIQUE: Multidetector CT imaging of the chest was performed following the standard protocol without IV contrast. RADIATION DOSE REDUCTION: This exam was performed according to the departmental  dose-optimization program which includes automated exposure control, adjustment of the mA and/or kV according to patient size and/or use of iterative reconstruction technique. COMPARISON:  Low-dose lung cancer screening chest CT 11/05/2021. FINDINGS: Cardiovascular: Heart size is normal. There is no significant pericardial fluid, thickening or pericardial calcification. There is aortic atherosclerosis, as well as atherosclerosis of the great vessels of the mediastinum and the coronary arteries, including calcified atherosclerotic plaque in the left anterior descending, left circumflex and right coronary arteries. Mediastinum/Nodes: No pathologically enlarged mediastinal or hilar lymph nodes. Please note that accurate exclusion of hilar adenopathy is limited on noncontrast CT scans. Esophagus is unremarkable in appearance. No axillary lymphadenopathy. Lungs/Pleura: Previously noted nodule of concern in the periphery of the right upper lobe (axial image 126) has enlarged, is now centrally cavitary, and has macrolobulated and slightly spiculated margins with a volume derived mean diameter of 15.5 mm, concerning for progressive neoplasm. Inferior to this, area of previously noted consolidation appears partially regressed and contracted, likely an area of evolving post infectious scarring. No pleural effusions. Mild diffuse bronchial wall thickening with very mild centrilobular and paraseptal emphysema. Upper Abdomen: Aortic atherosclerosis.  Status post cholecystectomy. Musculoskeletal: Orthopedic fixation hardware in the cervical and lumbar spine partially imaged. There are no aggressive appearing lytic or blastic lesions noted in the visualized portions of the skeleton. IMPRESSION: 1. Enlarging morphologically concerning pulmonary nodule in the periphery of the right upper lobe categorized as Lung-RADS 4B, suspicious. Additional imaging evaluation or consultation with Pulmonology or Thoracic Surgery recommended. 2.  The "S" modifier above refers to potentially clinically significant  non lung cancer related findings. Specifically, there is aortic atherosclerosis, in addition to three-vessel coronary artery disease. Please note that although the presence of coronary artery calcium documents the presence of coronary artery disease, the severity of this disease and any potential stenosis cannot be assessed on this non-gated CT examination. Assessment for potential risk factor modification, dietary therapy or pharmacologic therapy may be warranted, if clinically indicated. 3. Mild diffuse bronchial wall thickening with very mild centrilobular and paraseptal emphysema; imaging findings suggestive of underlying COPD. These results will be called to the ordering clinician or representative by the Radiologist Assistant, and communication documented in the PACS or [employer]. Aortic Atherosclerosis (ICD10-I70.0) and Emphysema (ICD10-J43.9). Electronically Signed   By: Vinnie Langton M.D.   On: 03/31/2022 10:10        Discharge Medications: Allergies as of 04/27/2022       Reactions   Loratadine Other (See Comments)   Leaves bad taste in the mouth.   Other Nausea And Vomiting   Sweet milk         Medication List     STOP taking these medications    ibuprofen 200 MG tablet Commonly known as: ADVIL       TAKE these medications    acetaminophen 500 MG tablet Commonly known as: TYLENOL Take 2 tablets (1,000 mg total) by mouth every 6 (six) hours as needed.   albuterol 108 (90 Base) MCG/ACT inhaler Commonly known as: VENTOLIN HFA Inhale 2 puffs into the lungs every 6 (six) hours as needed for wheezing or shortness of breath.   ALIVE MULTI-VITAMIN PO Take 1 tablet by mouth daily. What changed: Another medication with the same name was removed. Continue taking this medication, and follow the directions you see here.   Anoro Ellipta 62.5-25 MCG/ACT Aepb Generic drug:  umeclidinium-vilanterol Inhale 1 puff into the lungs daily.   ascorbic acid 500 MG tablet Commonly known as: VITAMIN C Take 500 mg by mouth daily.   Astepro 0.15 % Soln Generic drug: Azelastine HCl Place into the nose.   atorvastatin 40 MG tablet Commonly known as: LIPITOR Take 1 tablet (40 mg total) by mouth daily.   CALCIUM CITRATE + PO Take 600 mg by mouth daily.   carvedilol 6.25 MG tablet Commonly known as: COREG TAKE 1 TABLET BY MOUTH TWICE DAILY   cetirizine 10 MG tablet Commonly known as: ZYRTEC Take 10 mg by mouth daily.   CoQ10 200 MG Caps Take 200 mg by mouth daily.   cyanocobalamin 1000 MCG tablet Commonly known as: VITAMIN B12 Take 1,000 mcg by mouth daily.   D3-1000 PO Take 1,000 Units by mouth daily. Soft gel   Fenugreek 500 MG Caps Take 610 mg by mouth daily.   Ferrous Sulfate 142 (45 Fe) MG Tbcr Take 45 mg by mouth every other day.   FISH OIL PO Take 600 mg by mouth daily.   fluticasone 50 MCG/ACT nasal spray Commonly known as: FLONASE Place 1 spray into both nostrils daily.   glipiZIDE 10 MG 24 hr tablet Commonly known as: GLUCOTROL XL Take 1 tablet by mouth once a day   guaifenesin 400 MG Tabs tablet Commonly known as: HUMIBID E Take 400 mg by mouth 2 (two) times daily.   Jardiance 10 MG Tabs tablet Generic drug: empagliflozin Take 1 tablet by mouth daily with breakfast   losartan 50 MG tablet Commonly known as: COZAAR Take 1 tablet (50 mg total) by mouth daily. Pt needs to keep upcoming appt in  Oct for further refills   Magnesium 400 MG Caps Take 400 mg by mouth 2 (two) times daily.   metFORMIN 850 MG tablet Commonly known as: GLUCOPHAGE Take 1 tablet (850 mg total) by mouth 2 (two) times daily with meals.   mirabegron ER 50 MG Tb24 tablet Commonly known as: MYRBETRIQ Take 1 tablet (50 mg total) by mouth daily. What changed: when to take this   morphine 15 MG 12 hr tablet Commonly known as: MS CONTIN Take 1 tablet (15  mg total) by mouth every 12 (twelve) hours. Must last 30 days. Do not break tablet What changed: Another medication with the same name was removed. Continue taking this medication, and follow the directions you see here.   morphine 15 MG 12 hr tablet Commonly known as: MS CONTIN Take 1 tablet (15 mg total) by mouth every 12 (twelve) hours. Must last 30 days. Do not break tablet Start taking on: May 17, 2022 What changed: Another medication with the same name was removed. Continue taking this medication, and follow the directions you see here.   naloxone 4 MG/0.1ML Liqd nasal spray kit Commonly known as: NARCAN Place 1 spray into the nose as needed for up to 365 doses (for opioid-induced respiratory depresssion). In case of emergency (overdose), spray once into each nostril. If no response within 3 minutes, repeat application and call [phone].   omeprazole 40 MG capsule Commonly known as: PRILOSEC Take 1 capsule by mouth once a day What changed: when to take this   OneTouch Ultra test strip Generic drug: glucose blood Check blood sugar 2 times a day as directed   OXYGEN Inhale 2 L into the lungs at bedtime.   Potassium 99 MG Tabs Take 99 mg by mouth 2 (two) times daily.   sennosides-docusate sodium 8.6-50 MG tablet Commonly known as: SENOKOT-S Take 2 tablets by mouth daily as needed for constipation.   sodium fluoride 1.1 % Gel dental gel Commonly known as: FLUORISHIELD Place 1 application  onto teeth 2 (two) times daily.   spironolactone 25 MG tablet Commonly known as: ALDACTONE Take 1 tablet by mouth daily. You must call for a follow up appointment for additional refills. Thank you   Systane 0.4-0.3 % Gel ophthalmic gel Generic drug: Polyethyl Glycol-Propyl Glycol Place 1 Application into the left eye daily as needed (Dry eyes).   tadalafil 20 MG tablet Commonly known as: CIALIS Take 20 mg by mouth daily as needed for erectile dysfunction.   traZODone 50 MG  tablet Commonly known as: DESYREL Take 50 mg by mouth at bedtime.   Turmeric Curcumin 500 MG Caps Take 500 mg by mouth daily. Black pepper               Durable Medical Equipment  (From admission, onward)           Start     Ordered   04/27/22 0756  For home use only DME oxygen  Once       Question Answer Comment  Length of Need 6 Months   Liters per Minute 2   Oxygen delivery system Gas      04/27/22 0755            Follow Up Appointments:  Follow-up Information     Lajuana Matte, MD Follow up on 05/08/2022.   Specialty: Cardiothoracic Surgery Why: Follow up at 10:50AM Contact information: [address] [place] [address] [phone]  Signed: Magdalene River PA-C 04/27/2022, 2:08 PM

## 2022-04-23 NOTE — Brief Op Note (Signed)
04/23/2022  10:54 AM  PATIENT:  Howard Maxwell  77 y.o. male  PRE-OPERATIVE DIAGNOSIS:  non small cell lung cancer  POST-OPERATIVE DIAGNOSIS:  non small cell lung cancer  PROCEDURE:  Procedure(s): XI ROBOTIC ASSISTED THORACOSCOPY-RIGHT UPPER LOBECTOMY (Right)  SURGEON:  Surgeon(s) and Role:    * Lightfoot, Lucile Crater, MD - Primary  PHYSICIAN ASSISTANT: Montae Stager PA-C  ANESTHESIA:   general  EBL: 100 ML  BLOOD ADMINISTERED:none  DRAINS:  1 28 F Chest Tube(s) in the RIGHT HEMITHORAX    LOCAL MEDICATIONS USED:  BUPIVICAINE  and OTHER EXPAREL  SPECIMEN:  Source of Specimen:  RUL AND MULTIPLE LN SAMPLES  DISPOSITION OF SPECIMEN:  PATHOLOGY  COUNTS:  YES  TOURNIQUET:  * No tourniquets in log *  DICTATION: .Dragon Dictation  PLAN OF CARE: Admit to inpatient   PATIENT DISPOSITION:  PACU - hemodynamically stable.   Delay start of Pharmacological VTE agent (>24hrs) due to surgical blood loss or risk of bleeding: no  COMPLICATIONS: NO KNOWN

## 2022-04-23 NOTE — Anesthesia Procedure Notes (Signed)
Arterial Line Insertion Performed by: Valda Favia, CRNA, CRNA  Preanesthetic checklist: patient identified, IV checked, site marked, risks and benefits discussed, surgical consent, monitors and equipment checked, pre-op evaluation, timeout performed and anesthesia consent Lidocaine 1% used for infiltration Left, radial was placed Catheter size: 20 G Hand hygiene performed , maximum sterile barriers used  and Seldinger technique used Allen's test indicative of satisfactory collateral circulation Attempts: 2 Procedure performed without using ultrasound guided technique. Following insertion, dressing applied and Biopatch. Post procedure assessment: normal  Patient tolerated the procedure well with no immediate complications.

## 2022-04-23 NOTE — Interval H&P Note (Signed)
History and Physical Interval Note:  04/23/2022 7:17 AM  Howard Maxwell  has presented today for surgery, with the diagnosis of NSCLC.  The various methods of treatment have been discussed with the patient and family. After consideration of risks, benefits and other options for treatment, the patient has consented to  Procedure(s): XI ROBOTIC ASSISTED THORACOSCOPY-RIGHT UPPER LOBECTOMY (Right) as a surgical intervention.  The patient's history has been reviewed, patient examined, no change in status, stable for surgery.  I have reviewed the patient's chart and labs.  Questions were answered to the patient's satisfaction.     [name]

## 2022-04-23 NOTE — Discharge Instructions (Addendum)
[facility] [phone]    Robot-Assisted Thoracic Surgery, Care After The following information offers guidance on how to care for yourself after your procedure. Your health care provider may also give you more specific instructions. If you have problems or questions, contact your health care provider. What can I expect after the procedure? After the procedure, it is common to have: Some pain and aches in the area of your surgical incisions. Pain when breathing in (inhaling) and coughing. Tiredness (fatigue). Trouble sleeping. Constipation. Follow these instructions at home: Medicines Take over-the-counter and prescription medicines only as told by your health care provider. If you were prescribed an antibiotic medicine, take it as told by your health care provider. Do not stop taking the antibiotic even if you start to feel better. Talk with your health care provider about safe and effective ways to manage pain after your procedure. Pain management should fit your specific health needs. Take pain medicine before pain becomes severe. Relieving and controlling your pain will make breathing easier for you. Ask your health care provider if the medicine prescribed to you requires you to avoid driving or using machinery. Eating and drinking Follow instructions from your health care provider about eating or drinking restrictions. These will vary depending on what procedure you had. Your health care provider may recommend: A liquid diet or soft diet for the first few days. Meals that are smaller and more frequent. A diet of fruits, vegetables, whole grains, and low-fat proteins. Limiting foods that are high in fat and processed sugar, including fried or sweet foods. Incision care Follow instructions from your health care provider about how to take care of your incisions. Make sure you: Wash your hands with soap and water for at least 20 seconds before and after you change  your bandage (dressing). If soap and water are not available, use hand sanitizer. Change your dressing as told by your health care provider. Leave stitches (sutures), skin glue, or adhesive strips in place. These skin closures may need to stay in place for 2 weeks or longer. If adhesive strip edges start to loosen and curl up, you may trim the loose edges. Do not remove adhesive strips completely unless your health care provider tells you to do that. Check your incision area every day for signs of infection. Check for: Redness, swelling, or more pain. Fluid or blood. Warmth. Pus or a bad smell. Activity Return to your normal activities as told by your health care provider. Ask your health care provider what activities are safe for you. Ask your health care provider when it is safe for you to drive. Do not lift anything that is heavier than 10 lb (4.5 kg), or the limit that you are told, until your health care provider says that it is safe. Rest as told by your health care provider. Avoid sitting for a long time without moving. Get up to take short walks every 1-2 hours. This is important to improve blood flow and breathing. Ask for help if you feel weak or unsteady. Do exercises as told by your health care provider. Pneumonia prevention  Do deep breathing exercises and cough regularly as directed. This helps clear mucus and opens your lungs. Doing this helps prevent lung infection (pneumonia). If you were given an incentive spirometer, use it as told. An incentive spirometer is a tool that measures how well you are filling your lungs with each breath. Coughing may hurt less if you try to support your chest. This  is called splinting. Try one of these when you cough: Hold a pillow against your chest. Place the palms of both hands on top of your incision area. Do not use any products that contain nicotine or tobacco. These products include cigarettes, chewing tobacco, and vaping devices, such as  e-cigarettes. If you need help quitting, ask your health care provider. Avoid secondhand smoke. General instructions If you have a drainage tube: Follow instructions from your health care provider about how to take care of it. Do not travel by airplane after your tube is removed until your health care provider tells you it is safe. You may need to take these actions to prevent or treat constipation: Drink enough fluid to keep your urine pale yellow. Take over-the-counter or prescription medicines. Eat foods that are high in fiber, such as beans, whole grains, and fresh fruits and vegetables. Limit foods that are high in fat and processed sugars, such as fried or sweet foods. Keep all follow-up visits. This is important. Contact a health care provider if: You have redness, swelling, or more pain around an incision. You have fluid or blood coming from an incision. An incision feels warm to the touch. You have pus or a bad smell coming from an incision. You have a fever. You cannot eat or drink without vomiting. Your pain medicine is not controlling your pain. Get help right away if: You have chest pain. Your heart is beating quickly. You have trouble breathing. You have trouble speaking. You are confused. You feel weak or dizzy, or you faint. These symptoms may represent a serious problem that is an emergency. Do not wait to see if the symptoms will go away. Get medical help right away. Call your local emergency services (911 in the [place] Do not drive yourself to the hospital. Summary Talk with your health care provider about safe and effective ways to manage pain after your procedure. Pain management should fit your specific health needs. Return to your normal activities as told by your health care provider. Ask your health care provider what activities are safe for you. Do deep breathing exercises and cough regularly as directed. This helps to clear mucus and prevent pneumonia. If it  hurts to cough, ease pain by holding a pillow against your chest or by placing the palms of both hands over your incisions. This information is not intended to replace advice given to you by your health care provider. Make sure you discuss any questions you have with your health care provider. Document Revised: 12/01/2019 Document Reviewed: 12/01/2019 Elsevier Patient Education  2023 [employer].     If any questions or concerns arise, please do not hesitate to contact our office at [phone]

## 2022-04-23 NOTE — Anesthesia Preprocedure Evaluation (Signed)
Anesthesia Evaluation  Patient identified by MRN, date of birth, ID band Patient awake    Reviewed: Allergy & Precautions, H&P , NPO status , Patient's Chart, lab work & pertinent test results  Airway Mallampati: II   Neck ROM: full    Dental   Pulmonary sleep apnea , COPD, former smoker   breath sounds clear to auscultation       Cardiovascular hypertension, + CAD and + Past MI   Rhythm:regular Rate:Normal     Neuro/Psych  Neuromuscular disease    GI/Hepatic ,GERD  ,,  Endo/Other  diabetes, Type 2    Renal/GU      Musculoskeletal  (+) Arthritis ,    Abdominal   Peds  Hematology   Anesthesia Other Findings   Reproductive/Obstetrics                             Anesthesia Physical Anesthesia Plan  ASA: 3  Anesthesia Plan: General   Post-op Pain Management:    Induction: Intravenous  PONV Risk Score and Plan: 2 and Ondansetron, Dexamethasone and Treatment may vary due to age or medical condition  Airway Management Planned: Double Lumen EBT  Additional Equipment: Arterial line  Intra-op Plan:   Post-operative Plan: Extubation in OR  Informed Consent: I have reviewed the patients History and Physical, chart, labs and discussed the procedure including the risks, benefits and alternatives for the proposed anesthesia with the patient or authorized representative who has indicated his/her understanding and acceptance.     Dental advisory given  Plan Discussed with: CRNA, Anesthesiologist and Surgeon  Anesthesia Plan Comments:        Anesthesia Quick Evaluation

## 2022-04-23 NOTE — Telephone Encounter (Signed)
ONO /RA showed minimal desaturation about 14 minutes less than 88%  I see that he is undergoing surgery for lung cancer right now.  Okay to continue on oxygen till we meet again

## 2022-04-23 NOTE — Hospital Course (Addendum)
History of Present Illness:     Howard Maxwell is 77 y.o. male who presented for surgical evaluation biopsy-proven non-small cell lung cancer. He has a significant smoking history. His cancer was initially picked up through the lung cancer screening program. He has no complaints. He denies any shortness of breath or coughs. His weight has been stable and he has no neurologic symptoms.    Dr. Kipp Brood reviewed the patient's diagnostic studies and determined surgical intervention would provide him the best long term treatment. He discussed the patient's treatment options as well as the risks and benefits of surgery. Howard Maxwell was agreeable to proceed with surgery.   Hospital course:  The patient was admitted electively and taken to the operating room on 04/23/2022 at which time he underwent a robotic assisted right upper lobectomy with lymph node dissection. He tolerated the procedure well and was taken to the postanesthesia care unit in stable condition. His right sided chest tube was placed to water seal. On POD1 he had a large air leak with cough. CXR showed no pneumothorax. Pulmonary hygiene was encouraged. He was ambulating independently and his pain was well controlled. Incisions were healing well without sign of infection.

## 2022-04-23 NOTE — Transfer of Care (Signed)
Immediate Anesthesia Transfer of Care Note  Patient: Howard Maxwell  Procedure(s) Performed: XI ROBOTIC ASSISTED THORACOSCOPY-RIGHT UPPER LOBECTOMY (Right: Chest) NODE DISSECTION (Right: Chest) INTERCOSTAL NERVE BLOCK (Right: Chest)  Patient Location: PACU  Anesthesia Type:General  Level of Consciousness: drowsy  Airway & Oxygen Therapy: Patient Spontanous Breathing and Patient connected to nasal cannula oxygen  Post-op Assessment: Report given to RN and Post -op Vital signs reviewed and stable  Post vital signs: Reviewed and stable  Last Vitals:  Vitals Value Taken Time  BP 109/51 04/23/22 1115  Temp 36.4 C 04/23/22 1110  Pulse 59 04/23/22 1121  Resp 20 04/23/22 1121  SpO2 96 % 04/23/22 1121  Vitals shown include unvalidated device data.  Last Pain:  Vitals:   04/23/22 0621  TempSrc: Oral  PainSc: 3       Patients Stated Pain Goal: 0 (00/76/22 6333)  Complications: No notable events documented.

## 2022-04-23 NOTE — Anesthesia Procedure Notes (Signed)
Procedure Name: Intubation Date/Time: 04/23/2022 7:47 AM  Performed by: Valda Favia, CRNAPre-anesthesia Checklist: Patient identified, Emergency Drugs available, Suction available, Patient being monitored and Timeout performed Patient Re-evaluated:Patient Re-evaluated prior to induction Oxygen Delivery Method: Circle system utilized Preoxygenation: Pre-oxygenation with 100% oxygen Induction Type: IV induction Ventilation: Mask ventilation without difficulty and Oral airway inserted - appropriate to patient size Laryngoscope Size: Mac and 4 Grade View: Grade II Endobronchial tube: Left, EBT position confirmed by auscultation, EBT position confirmed by fiberoptic bronchoscope and Double lumen EBT and 39 Fr Number of attempts: 1 Placement Confirmation: ETT inserted through vocal cords under direct vision, positive ETCO2 and breath sounds checked- equal and bilateral Secured at: 29 cm Tube secured with: Tape Dental Injury: Teeth and Oropharynx as per pre-operative assessment

## 2022-04-23 NOTE — Op Note (Signed)
      [address]       [place] [address]             [phone]        04/23/2022  Patient:  [name] Pre-Op Dx: RUL NSCLC   Post-op Dx:  same Procedure: - Robotic assisted right video thoracoscopy - Right upper lobectomy - Mediastinal lymph node sampling - Intercostal nerve block  Surgeon and Role:      * Ngoc Detjen, Lucile Crater, MD - Primary  Assistant: Evonnie Pat, PA-C  An experienced assistant was required given the complexity of this surgery and the standard of surgical care. The assistant was needed for exposure, dissection, suctioning, retraction of delicate tissues and sutures, instrument exchange and for overall help during this procedure.    Anesthesia  general EBL:  150 ml Blood Administration: none Specimen:  right upper lobe, hilar and mediastinal nodes  Drains: 28 F argyle chest tube in right chest Counts: correct   Indications: 77yo male with right upper lobe non-small cell lung cancer.  His FVC, and FEV1 are on the lower side, but his DLCO is adequate.  He also has limited mobility.  We discussed the risks and benefits of a right robotic assisted thoracoscopy with right upper lobectomy.   Findings: Nml anatomy.  Nml appearing lymph nodes  Operative Technique: After the risks, benefits and alternatives were thoroughly discussed, the patient was brought to the operative theatre.  Anesthesia was induced, and the patient was then placed in a lateral decubitus position and was prepped and draped in normal sterile fashion.  An appropriate surgical pause was performed, and pre-operative antibiotics were dosed accordingly.  We began by placing our 4 robotic ports in the the 7th intercostal space targeting the hilum of the lung.  A 49mm assistant port was placed in the 9th intercostal space in the anterior axillary line.  The robot was then docked and all instruments were passed under direct visualization.    The lung was then retracted  superiorly, and the inferior pulmonary ligament was divided.  The hilum was mobilized anteriorly and posteriorly.  We identified the right upper lobe pulmonary vein, and after careful isolation, it was divided with a vascular stapler.  We next moved to the pulmonary artery.  The artery was then divided with a vascular load stapler.  The bronchus to the right upper lobe was then isolated.  After a test clamp, with good ventilation of the remaining lung, the bronchus was then divided.  The fissure was completed, and the specimen was passed into an endocatch bag.  It was removed from the anterior access site.    Lymph nodes were then sampled at hilum and mediastinum.  The chest was irrigated, and an air leak test was performed.  An intercostal nerve block was performed under direct visualization.  A 28 F chest tube was then placed, and we watch the remaining lobes re-expand.  The skin and soft tissue were closed with absorbable suture    The patient tolerated the procedure without any immediate complications, and was transferred to the PACU in stable condition.  Journey Ratterman Bary Leriche

## 2022-04-23 NOTE — Progress Notes (Signed)
The proposed treatment discussed in conference is for discussion purpose only and is not a binding recommendation.  The patients have not been physically examined, or presented with their treatment options.  Therefore, final treatment plans cannot be decided.  

## 2022-04-24 ENCOUNTER — Encounter (HOSPITAL_COMMUNITY): Payer: Self-pay | Admitting: Thoracic Surgery (Cardiothoracic Vascular Surgery)

## 2022-04-24 ENCOUNTER — Inpatient Hospital Stay (HOSPITAL_COMMUNITY): Payer: PPO

## 2022-04-24 DIAGNOSIS — Z902 Acquired absence of lung [part of]: Secondary | ICD-10-CM

## 2022-04-24 LAB — GLUCOSE, CAPILLARY
Glucose-Capillary: 168 mg/dL — ABNORMAL HIGH (ref 70–99)
Glucose-Capillary: 169 mg/dL — ABNORMAL HIGH (ref 70–99)
Glucose-Capillary: 170 mg/dL — ABNORMAL HIGH (ref 70–99)
Glucose-Capillary: 193 mg/dL — ABNORMAL HIGH (ref 70–99)
Glucose-Capillary: 256 mg/dL — ABNORMAL HIGH (ref 70–99)

## 2022-04-24 LAB — BASIC METABOLIC PANEL
Anion gap: 9 (ref 5–15)
BUN: 15 mg/dL (ref 8–23)
CO2: 26 mmol/L (ref 22–32)
Calcium: 8.1 mg/dL — ABNORMAL LOW (ref 8.9–10.3)
Chloride: 97 mmol/L — ABNORMAL LOW (ref 98–111)
Creatinine, Ser: 0.89 mg/dL (ref 0.61–1.24)
GFR, Estimated: >60 mL/min (ref >60)
Glucose, Bld: 250 mg/dL — ABNORMAL HIGH (ref 70–99)
Potassium: 4.4 mmol/L (ref 3.5–5.1)
Sodium: 132 mmol/L — ABNORMAL LOW (ref 135–145)

## 2022-04-24 LAB — CBC
HCT: 39.4 % (ref 39.0–52.0)
Hemoglobin: 12.8 g/dL — ABNORMAL LOW (ref 13.0–17.0)
MCH: 30.7 pg (ref 26.0–34.0)
MCHC: 32.5 g/dL (ref 30.0–36.0)
MCV: 94.5 fL (ref 80.0–100.0)
Platelets: 167 10*3/uL (ref 150–400)
RBC: 4.17 MIL/uL — ABNORMAL LOW (ref 4.22–5.81)
RDW: 13.5 % (ref 11.5–15.5)
WBC: 11.4 10*3/uL — ABNORMAL HIGH (ref 4.0–10.5)
nRBC: 0 % (ref 0.0–0.2)

## 2022-04-24 LAB — SURGICAL PATHOLOGY

## 2022-04-24 MED ORDER — CEFAZOLIN SODIUM-DEXTROSE 2-4 GM/100ML-% IV SOLN
2.0000 g | Freq: Three times a day (TID) | INTRAVENOUS | Status: AC
  Administered 2022-04-24: 2 g via INTRAVENOUS
  Filled 2022-04-24 (×2): qty 100

## 2022-04-24 MED ORDER — INSULIN ASPART 100 UNIT/ML IJ SOLN: 0.0000 [IU] | Freq: Four times a day (QID) | INTRAMUSCULAR | Status: DC | PRN

## 2022-04-24 NOTE — Progress Notes (Signed)
  Transition of Care [facility]) Screening Note   Patient Details  Name: Howard Maxwell Date of Birth: 1945/04/08   Transition of Care [facility]) CM/SW Contact:    Tom-Johnson, Renea Ee, RN Phone Number: 04/24/2022, 1:09 PM  Patient is admitted for Lung Mass, had Right upper Lobectomy with Rt Chest tube placement on 04/23/22. Patient is currently on room air. Ambulates with walker.   Transition of Care Department [facility]) has reviewed patient and no TOC needs or recommendations have been identified at this time. TOC will continue to monitor patient advancement through interdisciplinary progression rounds. If new patient transition needs arise, please place a TOC consult.

## 2022-04-24 NOTE — Progress Notes (Signed)
Mobility Specialist Progress Note    04/24/22 1549  Mobility  Activity Ambulated with assistance in hallway  Level of Assistance Standby assist, set-up cues, supervision of patient - no hands on  Assistive Device Front wheel walker  Distance Ambulated (ft) 400 ft  Activity Response Tolerated well  Mobility Referral Yes  $Mobility charge 1 Mobility   Pt received in chair and agreeable. No complaints on walk. Returned to chair with call bell in reach.   Hildred Alamin Mobility Specialist  Please Psychologist, sport and exercise or Rehab Office at [phone]

## 2022-04-24 NOTE — Progress Notes (Signed)
Patient hesitant to get 12 units of regular insulin for a blood sugar of 256. He said he would take 8 units. Given in right arm. Will recheck CBG in 2 hours.

## 2022-04-24 NOTE — Progress Notes (Signed)
Mobility Specialist Progress Note    04/24/22 0943  Mobility  Activity Ambulated with assistance in hallway  Level of Assistance Standby assist, set-up cues, supervision of patient - no hands on  Assistive Device Front wheel walker  Distance Ambulated (ft) 420 ft  Activity Response Tolerated well  Mobility Referral Yes  $Mobility charge 1 Mobility   Pre-Mobility: 60 HR, 94% SpO2 During Mobility: 78 HR Post-Mobility: 63 HR, 89% SpO2  Pt received in chair and agreeable. No complaints on walk. SpO2 did drop into mid to high 80s during but recovered w/ pursed lip breathing. Returned to chair with call bell in reach.    Hildred Alamin Mobility Specialist  Please Psychologist, sport and exercise or Rehab Office at [phone]

## 2022-04-24 NOTE — Progress Notes (Addendum)
      [address]       [place] [address]             [phone]      1 Day Post-Op Procedure(s) (LRB): XI ROBOTIC ASSISTED THORACOSCOPY-RIGHT UPPER LOBECTOMY (Right) NODE DISSECTION (Right) INTERCOSTAL NERVE BLOCK (Right) Subjective: Patient states he has more back pain which is a chronic problem than he does pain from the surgery.   Objective: Vital signs in last 24 hours: Temp:  [97.5 F (36.4 C)-98.7 F (37.1 C)] 97.7 F (36.5 C) (02/02 0725) Pulse Rate:  [58-89] 58 (02/02 0725) Cardiac Rhythm: Sinus bradycardia;Heart block (02/02 0355) Resp:  [10-25] 12 (02/02 0725) BP: (99-152)/(51-133) 115/74 (02/02 0725) SpO2:  [91 %-99 %] 91 % (02/02 0725) Arterial Line BP: (108-171)/(43-89) 171/84 (02/01 1600) Weight:  [107.9 kg] 107.9 kg (02/01 2040)  Hemodynamic parameters for last 24 hours:    Intake/Output from previous day: 02/01 0701 - 02/02 0700 In: 2960 [P.O.:360; I.V.:2300; IV Piggyback:300] Out: 1202 [Urine:600; Blood:150; Chest Tube:452] Intake/Output this shift: No intake/output data recorded.  General appearance: alert, cooperative, and no distress Neurologic: intact Heart: Sinus bradycardia, 1st degree heart block, no murmur Lungs: Diminished breath sounds  Wound: Clean and dry, no sign of infection.   Lab Results: Recent Labs    04/21/22 1430 04/24/22 0024  WBC 7.5 11.4*  HGB 13.8 12.8*  HCT 43.3 39.4  PLT 184 167   BMET:  Recent Labs    04/21/22 1430 04/24/22 0024  NA 135 132*  K 4.7 4.4  CL 99 97*  CO2 24 26  GLUCOSE 147* 250*  BUN 15 15  CREATININE 0.77 0.89  CALCIUM 8.8* 8.1*    PT/INR:  Recent Labs    04/21/22 1430  LABPROT 13.4  INR 1.0   ABG    Component Value Date/Time   TCO2 25 09/30/2007 1235   CBG (last 3)  Recent Labs    04/24/22 0007 04/24/22 0213 04/24/22 0611  GLUCAP 256* 193* 169*    Assessment/Plan: S/P Procedure(s) (LRB): XI ROBOTIC ASSISTED THORACOSCOPY-RIGHT UPPER LOBECTOMY  (Right) NODE DISSECTION (Right) INTERCOSTAL NERVE BLOCK (Right)  CV: SB, 1st degree heart block, HR 58. SBP 115.  Pulm: No pneumothoax on CXR. CT to waterseal, large air leak with cough. CT output 452cc/24hrs. Leave CT in place to waterseal. Saturating well on RA this AM. Encouraged IS and ambulation.   GI: On diet  Endo: CBGs elevated 256/193/169. On SSI, refused full insulin dose. Will restart home diabetes medications and SSI PRN.  Renal: Ok UO. Cr 0.89  Dispo: Continue CT to waterseal. Encourage pulmonary hygiene.    LOS: 1 day    Magdalene River, PA-C 04/24/2022  Agree with above + leak with cough IS, ambulation  Quiera Diffee O Gurvir Schrom

## 2022-04-25 ENCOUNTER — Inpatient Hospital Stay (HOSPITAL_COMMUNITY): Payer: PPO

## 2022-04-25 LAB — CBC
HCT: 38.2 % — ABNORMAL LOW (ref 39.0–52.0)
Hemoglobin: 12.6 g/dL — ABNORMAL LOW (ref 13.0–17.0)
MCH: 31 pg (ref 26.0–34.0)
MCHC: 33 g/dL (ref 30.0–36.0)
MCV: 93.9 fL (ref 80.0–100.0)
Platelets: 142 10*3/uL — ABNORMAL LOW (ref 150–400)
RBC: 4.07 MIL/uL — ABNORMAL LOW (ref 4.22–5.81)
RDW: 13.6 % (ref 11.5–15.5)
WBC: 9.6 10*3/uL (ref 4.0–10.5)
nRBC: 0 % (ref 0.0–0.2)

## 2022-04-25 LAB — COMPREHENSIVE METABOLIC PANEL
ALT: 17 U/L (ref 0–44)
AST: 17 U/L (ref 15–41)
Albumin: 2.8 g/dL — ABNORMAL LOW (ref 3.5–5.0)
Alkaline Phosphatase: 47 U/L (ref 38–126)
Anion gap: 7 (ref 5–15)
BUN: 23 mg/dL (ref 8–23)
CO2: 26 mmol/L (ref 22–32)
Calcium: 7.9 mg/dL — ABNORMAL LOW (ref 8.9–10.3)
Chloride: 96 mmol/L — ABNORMAL LOW (ref 98–111)
Creatinine, Ser: 1.02 mg/dL (ref 0.61–1.24)
GFR, Estimated: >60 mL/min (ref >60)
Glucose, Bld: 152 mg/dL — ABNORMAL HIGH (ref 70–99)
Potassium: 4 mmol/L (ref 3.5–5.1)
Sodium: 129 mmol/L — ABNORMAL LOW (ref 135–145)
Total Bilirubin: 0.5 mg/dL (ref 0.3–1.2)
Total Protein: 5.2 g/dL — ABNORMAL LOW (ref 6.5–8.1)

## 2022-04-25 LAB — GLUCOSE, CAPILLARY
Glucose-Capillary: 131 mg/dL — ABNORMAL HIGH (ref 70–99)
Glucose-Capillary: 144 mg/dL — ABNORMAL HIGH (ref 70–99)
Glucose-Capillary: 152 mg/dL — ABNORMAL HIGH (ref 70–99)
Glucose-Capillary: 157 mg/dL — ABNORMAL HIGH (ref 70–99)
Glucose-Capillary: 190 mg/dL — ABNORMAL HIGH (ref 70–99)

## 2022-04-25 NOTE — Progress Notes (Signed)
      [address]       [place] [address]             [phone]      2 Days Post-Op Procedure(s) (LRB): XI ROBOTIC ASSISTED THORACOSCOPY-RIGHT UPPER LOBECTOMY (Right) NODE DISSECTION (Right) INTERCOSTAL NERVE BLOCK (Right)  Subjective:  Patient doing well.  He is sleepy this morning, stating the Tramadol does this to him.  He is ambulating.  Has not yet moved his bowels.  Objective: Vital signs in last 24 hours: Temp:  [97.7 F (36.5 C)-98.3 F (36.8 C)] 98 F (36.7 C) (02/03 0733) Pulse Rate:  [53-63] 63 (02/03 0733) Cardiac Rhythm: Heart block (02/03 0740) Resp:  [13-20] 20 (02/03 0733) BP: (114-134)/(59-68) 123/64 (02/03 0733) SpO2:  [91 %-96 %] 92 % (02/03 0901)  Intake/Output from previous day: 02/02 0701 - 02/03 0700 In: 840 [P.O.:840] Out: 370 [Chest Tube:370]  General appearance: alert, cooperative, and no distress Heart: regular rate and rhythm Lungs: clear to auscultation bilaterally Abdomen: soft, non-tender; bowel sounds normal; no masses,  no organomegaly Extremities: extremities normal, atraumatic, no cyanosis or edema Wound: clean and dry  Lab Results: Recent Labs    04/24/22 0024 04/25/22 0014  WBC 11.4* 9.6  HGB 12.8* 12.6*  HCT 39.4 38.2*  PLT 167 142*   BMET:  Recent Labs    04/24/22 0024 04/25/22 0014  NA 132* 129*  K 4.4 4.0  CL 97* 96*  CO2 26 26  GLUCOSE 250* 152*  BUN 15 23  CREATININE 0.89 1.02  CALCIUM 8.1* 7.9*    PT/INR: No results for input(s): "LABPROT", "INR" in the last 72 hours. ABG    Component Value Date/Time   TCO2 25 09/30/2007 1235   CBG (last 3)  Recent Labs    04/24/22 1625 04/25/22 0018 04/25/22 0612  GLUCAP 170* 144* 157*    Assessment/Plan: S/P Procedure(s) (LRB): XI ROBOTIC ASSISTED THORACOSCOPY-RIGHT UPPER LOBECTOMY (Right) NODE DISSECTION (Right) INTERCOSTAL NERVE BLOCK (Right)  CV- NSR with 1st degree block, BP well controlled- continue Cozaar, Coreg,  Aldactone Pulm- CT on water seal, output 370 cc.. leave in place today, CXR w/o pneumothorax + sub q emphysema DM- sugars are well controlled continue Metformin, Jardiance, Glipizide Dispo- patient stable, continue current care, CT remains in place today on water seal, repeat CXR in AM   LOS: 2 days    Ellwood Handler, PA-C 04/25/2022

## 2022-04-25 NOTE — Progress Notes (Signed)
Mobility Specialist - Progress Note   04/25/22 1511  Mobility  Activity Ambulated with assistance in hallway  Level of Assistance Standby assist, set-up cues, supervision of patient - no hands on  Assistive Device Front wheel walker  Distance Ambulated (ft) 420 ft  Activity Response Tolerated well  Mobility Referral Yes  $Mobility charge 1 Mobility   Pt was received at doorway and agreeable to session. Pt c/o slight pain at chest tube sight. Pt was returned to room with all needs met.   Franki Monte  Mobility Specialist Please contact via Solicitor or Rehab office at [phone]

## 2022-04-26 ENCOUNTER — Inpatient Hospital Stay (HOSPITAL_COMMUNITY): Payer: PPO

## 2022-04-26 LAB — GLUCOSE, CAPILLARY
Glucose-Capillary: 131 mg/dL — ABNORMAL HIGH (ref 70–99)
Glucose-Capillary: 138 mg/dL — ABNORMAL HIGH (ref 70–99)
Glucose-Capillary: 197 mg/dL — ABNORMAL HIGH (ref 70–99)

## 2022-04-26 NOTE — Progress Notes (Signed)
SATURATION QUALIFICATIONS: (This note is used to comply with regulatory documentation for home oxygen)  Patient Saturations on Room Air at Rest = 87%  Patient Saturations on 3 Liters of oxygen while Ambulating = 93%  Please briefly explain why patient needs home oxygen:

## 2022-04-26 NOTE — Progress Notes (Signed)
      [address]       [place] [address]             [phone]      3 Days Post-Op Procedure(s) (LRB): XI ROBOTIC ASSISTED THORACOSCOPY-RIGHT UPPER LOBECTOMY (Right) NODE DISSECTION (Right) INTERCOSTAL NERVE BLOCK (Right)  Subjective:  Patient without new complaints.  Needs to get up and use the restroom.  + ambulation w/o difficulty.  Objective: Vital signs in last 24 hours: Temp:  [97.7 F (36.5 C)-99.2 F (37.3 C)] 97.7 F (36.5 C) (02/04 0734) Pulse Rate:  [57-85] 69 (02/04 0734) Cardiac Rhythm: Heart block (02/04 0725) Resp:  [11-20] 20 (02/04 0734) BP: (112-152)/(59-82) 118/66 (02/04 0734) SpO2:  [91 %-95 %] 91 % (02/04 0734)  Intake/Output from previous day: 02/03 0701 - 02/04 0700 In: 240 [P.O.:240] Out: 600 [Chest Tube:600] Intake/Output this shift: Total I/O In: 120 [P.O.:120] Out: -   General appearance: alert, cooperative, and no distress Heart: regular rate and rhythm Lungs: diminished breath sounds bibasilar Abdomen: soft, non-tender; bowel sounds normal; no masses,  no organomegaly Extremities: extremities normal, atraumatic, no cyanosis or edema Wound: clean and dry  Lab Results: Recent Labs    04/24/22 0024 04/25/22 0014  WBC 11.4* 9.6  HGB 12.8* 12.6*  HCT 39.4 38.2*  PLT 167 142*   BMET:  Recent Labs    04/24/22 0024 04/25/22 0014  NA 132* 129*  K 4.4 4.0  CL 97* 96*  CO2 26 26  GLUCOSE 250* 152*  BUN 15 23  CREATININE 0.89 1.02  CALCIUM 8.1* 7.9*    PT/INR: No results for input(s): "LABPROT", "INR" in the last 72 hours. ABG    Component Value Date/Time   TCO2 25 09/30/2007 1235   CBG (last 3)  Recent Labs    04/25/22 1748 04/25/22 2325 04/26/22 0613  GLUCAP 190* 131* 131*    Assessment/Plan: S/P Procedure(s) (LRB): XI ROBOTIC ASSISTED THORACOSCOPY-RIGHT UPPER LOBECTOMY (Right) NODE DISSECTION (Right) INTERCOSTAL NERVE BLOCK (Right)  CV- NSR with 1st degree AV Block, BP stable- continue  Coreg, Cozaar, Aldactone Pulm- CT remains on water seal, output > 600 cc (level at 1670) + tidaling with cough.Marland Kitchen leave CT in place today due to high output.. CXR stable DM- sugars remain well controlled continue Metformin, Jardiance, Glipizide Dispo- patient stable, continues to do well, air leak has resolved, there was mild tidaling with cough.. however output is too high to remove chest tube, repeat CXR in AM   LOS: 3 days    Ellwood Handler, PA-C 04/26/2022

## 2022-04-27 ENCOUNTER — Inpatient Hospital Stay (HOSPITAL_COMMUNITY): Payer: PPO

## 2022-04-27 LAB — GLUCOSE, CAPILLARY
Glucose-Capillary: 152 mg/dL — ABNORMAL HIGH (ref 70–99)
Glucose-Capillary: 152 mg/dL — ABNORMAL HIGH (ref 70–99)
Glucose-Capillary: 163 mg/dL — ABNORMAL HIGH (ref 70–99)

## 2022-04-27 MED ORDER — ACETAMINOPHEN 500 MG PO TABS: 1000.0000 mg | ORAL_TABLET | Freq: Four times a day (QID) | ORAL | 0 refills | Status: DC | PRN

## 2022-04-27 NOTE — Care Management Important Message (Signed)
Important Message  Patient Details  Name: Howard Maxwell MRN: [id number] Date of Birth: 1945/05/31   Medicare Important Message Given:  Yes     Orbie Pyo 04/27/2022, 2:18 PM

## 2022-04-27 NOTE — Progress Notes (Cosign Needed Addendum)
      [address]       [place] [address]             [phone]      4 Days Post-Op Procedure(s) (LRB): XI ROBOTIC ASSISTED THORACOSCOPY-RIGHT UPPER LOBECTOMY (Right) NODE DISSECTION (Right) INTERCOSTAL NERVE BLOCK (Right) Subjective: Patient with no new complaints, he states he is ready to go home.  Objective: Vital signs in last 24 hours: Temp:  [97.5 F (36.4 C)-98.4 F (36.9 C)] 97.7 F (36.5 C) (02/05 0508) Pulse Rate:  [59-68] 68 (02/05 0508) Cardiac Rhythm: Heart block (02/05 0700) Resp:  [14-19] 16 (02/05 0508) BP: (119-136)/(64-74) 136/74 (02/05 0508) SpO2:  [90 %-95 %] 92 % (02/05 0508)  Hemodynamic parameters for last 24 hours:    Intake/Output from previous day: 02/04 0701 - 02/05 0700 In: 360 [P.O.:360] Out: 840 [Urine:650; Chest Tube:190] Intake/Output this shift: No intake/output data recorded.  General appearance: alert, cooperative, and no distress Neurologic: intact Heart: Regular rate and rhythm, no murmur Lungs: clear to auscultation bilaterally Abdomen: soft, nontender Extremities: extremities normal, atraumatic, no cyanosis or edema Wound: Clean and dry dressing over chest tube site  Lab Results: Recent Labs    04/25/22 0014  WBC 9.6  HGB 12.6*  HCT 38.2*  PLT 142*   BMET:  Recent Labs    04/25/22 0014  NA 129*  K 4.0  CL 96*  CO2 26  GLUCOSE 152*  BUN 23  CREATININE 1.02  CALCIUM 7.9*    PT/INR: No results for input(s): "LABPROT", "INR" in the last 72 hours. ABG    Component Value Date/Time   TCO2 25 09/30/2007 1235   CBG (last 3)  Recent Labs    04/26/22 1858 04/27/22 0222 04/27/22 0611  GLUCAP 197* 152* 152*    Assessment/Plan: S/P Procedure(s) (LRB): XI ROBOTIC ASSISTED THORACOSCOPY-RIGHT UPPER LOBECTOMY (Right) NODE DISSECTION (Right) INTERCOSTAL NERVE BLOCK (Right)  CV: NSR, 1st degree AV block. Hx of HTN, stable BP. Continue Coreg, Cozaar and Spironolactone.   Pulm: Oxygen  saturations dropping into the 80s at rest and with ambulation. Saturating 91% on RA this AM. May require home oxygen. Stable CXR with atelectasis and no pneumothorax. CT without air leak. CT output decreased 190cc/24 hours. Plan to d/c CT. Continue IS and ambulation.  GI: +BM, good appetite.  Endo: CBGs 197/152/152. Ok control on Jardiance, Glipizide and Metformin.   Renal: Cr 1.02, good UO  Expected postop ABLA: Trending down slightly but CT output has greatly decreased. H/H 12.6/38.2.  Dispo: Plan to d/c CT today. Will order home oxygen. D/C to home if stable CXR following CT removal.   LOS: 4 days    Magdalene River, PA-C 04/27/2022

## 2022-04-27 NOTE — TOC Initial Note (Signed)
Transition of Care [facility]) - Initial/Assessment Note    Patient Details  Name: Howard Maxwell MRN: [id number] Date of Birth: 1945/08/05  [facility] CM/SW Contact:    Cyndi Bender, RN Phone Number: 04/27/2022, 12:23 PM  Clinical Narrative:                 Spoke to patient regarding home 02 needs.  Patient states he has nighttime home 02 from [employer]. Notified Erasmo Downer with [employer] of portable home 02 needs. Patient states wife will transport home.  No other TOC needs. Address, Phone number and PCP verified.  Expected Discharge Plan: Home/Self Care Barriers to Discharge: Continued Medical Work up   Patient Goals and CMS Choice Patient states their goals for this hospitalization and ongoing recovery are:: return home          Expected Discharge Plan and Services       Living arrangements for the past 2 months: Single Family Home                 DME Arranged: Oxygen DME Agency: [employer] Date DME Agency Contacted: 04/27/22 Time DME Agency Contacted: 69 Representative spoke with at DME Agency: [place]            Prior Living Arrangements/Services Living arrangements for the past 2 months: [place] Lives with:: Spouse Patient language and need for interpreter reviewed:: Yes Do you feel safe going back to the place where you live?: Yes      Need for Family Participation in Patient Care: Yes (Comment) Care giver support system in place?: Yes (comment) Current home services: DME (02 for night time) Criminal Activity/Legal Involvement Pertinent to Current Situation/Hospitalization: No - Comment as needed  Activities of Daily Living Home Assistive Devices/Equipment: Cane (specify quad or straight) (Inspire COPD device) ADL Screening (condition at time of admission) Patient's cognitive ability adequate to safely complete daily activities?: Yes Is the patient deaf or have difficulty hearing?: No Does the patient have difficulty seeing, even  when wearing glasses/contacts?: 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) Does the patient have difficulty concentrating, remembering, or making decisions?: No Patient able to express need for assistance with ADLs?: Yes Does the patient have difficulty dressing or bathing?: No Independently performs ADLs?: Yes (appropriate for developmental age) Does the patient have difficulty walking or climbing stairs?: No Weakness of Legs: Both Weakness of Arms/Hands: None  Permission Sought/Granted                  Emotional Assessment Appearance:: Appears stated age Attitude/Demeanor/Rapport: Gracious Affect (typically observed): Accepting Orientation: : Oriented to Self, Oriented to Place, Oriented to  Time, Oriented to Situation Alcohol / Substance Use: Not Applicable Psych Involvement: No (comment)  Admission diagnosis:  Lung mass [R91.8] Patient Active Problem List   Diagnosis Date Noted   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   Lung mass 04/23/2022   Lung nodule 04/02/2022   Other emphysema ([place]) 01/06/2022   Arthritis of left knee 12/16/2021   Lung nodule seen on imaging study 12/08/2021   Dizziness 11/18/2021   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   Idiopathic sleep related nonobstructive alveolar hypoventilation 05/26/2021   Insomnia 05/26/2021   Psychophysiologic insomnia 05/26/2021   Abnormal EMG (electromyogram) (01/28/2021) 03/04/2021   Polyneuropathy, 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   Numbness 02/26/2021   Aortic atherosclerosis ([place]) 12/30/2020   Osteoarthritis of hip (Right) 12/10/2020   Ex-smoker 08/28/2020   Chronic midline low back pain without sciatica 08/28/2020  Chronic use of opiate for therapeutic purpose 08/21/2020   Post-nasal drainage 07/11/2020   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   Cervical facet syndrome (Left) 02/21/2020   Cervicalgia 02/01/2020   DDD (degenerative disc disease), cervical 02/01/2020   Chronic sinusitis 01/23/2020   OSA  (obstructive sleep apnea) 57/32/2025   Uncomplicated opioid dependence ([place]) 11/28/2019   Chronic shoulder pain (Left) 11/28/2019   Hypertensive disorder 11/17/2019   Foot callus 10/27/2018   Type 2 diabetes mellitus with hyperglycemia, without long-term current use of insulin ([place]) 07/13/2018   Acquired trigger finger of left middle finger 06/08/2018   Pain in finger of left hand 06/08/2018   Chronic hip pain (Right) 12/07/2017   Anemia 10/05/2017   Trigger finger 09/16/2017   Lumbar stenosis with neurogenic claudication 08/23/2017   Degenerative disorder of musculoskeletal system 06/10/2017   Cervical radiculopathy 05/28/2017   Cervical spondylosis with myelopathy 05/05/2017   Lumbar spondylosis 04/01/2017   Disorder of skeletal system 12/23/2016   Pharmacologic therapy 12/23/2016   Problems influencing health status 12/23/2016   Sensory neuropathy 11/25/2016   Chronic upper extremity pain (1ry area of Pain) (Bilateral) (L>R) 11/12/2016   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) 11/12/2016   Lumbar radiculopathy (Bilateral) 10/13/2016   Weakness of proximal end of lower extremity (Bilateral) 10/13/2016   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) 08/06/2016   Musculoskeletal pain 08/06/2016   Osteoarthritis of lumbar spine 08/06/2016   Chronic low back pain (2ry area of Pain) (Bilateral) (R>L) w/o sciatica 08/06/2016   Chronic hip pain (3ry area of Pain) (Bilateral) (R>L) 08/06/2016   Chronic neck pain (4th area of Pain) (Bilateral) (L>R) 08/06/2016   Chronic knee pain (Left) 08/06/2016   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, C5-6, C6-7; 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) (s/p ACDF) 08/06/2016   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) 08/06/2016   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) 08/06/2016   Tricompartmental disease of knee (Left) 08/06/2016   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) 08/06/2016   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) 08/06/2016   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) 08/06/2016   GERD (gastroesophageal reflux disease) 08/06/2016   Vitamin D insufficiency 08/06/2016   Long term prescription opiate use 08/06/2016   Lumbar facet syndrome (Bilateral) (R>L) 08/06/2016   Lumbar facet arthropathy (HCC) (Bilateral & Multilevel) (L2-3 to L5-S1) 08/06/2016   Lumbar facet joint synovial cysts (Right: L3-4 & L5-S1 & Left: L3-4) 08/06/2016   Diabetes mellitus ([place]) 08/05/2016   Chronic pain syndrome 08/05/2016   Opiate use (30 MME/Day) 08/05/2016   Long-term use of high-risk medication 08/05/2016   Abnormal MRI, lumbar spine (10/13/16) 08/05/2016   Abnormal MRI, cervical spine 08/05/2016   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) 08/05/2016   History of cervical spinal surgery (C3-4 and C6-7 ACDF) 08/05/2016   BMI 35.0-35.9,adult 07/17/2016   DDD (degenerative disc disease), lumbosacral 07/17/2016   Lumbar central spinal 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 [place] [address] Phone: [phone] Fax: [phone]     Social Determinants  of Health (SDOH) Social History: SDOH Screenings   Food Insecurity: No Food Insecurity (04/23/2022)  Housing: Low Risk  (04/23/2022)  Transportation Needs: No Transportation Needs (04/23/2022)  Utilities: Not At Risk (04/23/2022)  Depression (PHQ2-9): Low Risk  (07/01/2021)  Tobacco Use: Medium Risk  (04/24/2022)   SDOH Interventions:     Readmission Risk Interventions     No data to display

## 2022-04-27 NOTE — Anesthesia Postprocedure Evaluation (Signed)
Anesthesia Post Note  Patient: [name]  Procedure(s) Performed: XI ROBOTIC ASSISTED THORACOSCOPY-RIGHT UPPER LOBECTOMY (Right: Chest) NODE DISSECTION (Right: Chest) INTERCOSTAL NERVE BLOCK (Right: Chest)     Patient location during evaluation: PACU Anesthesia Type: General Level of consciousness: awake and alert Pain management: pain level controlled Vital Signs Assessment: post-procedure vital signs reviewed and stable Respiratory status: spontaneous breathing, nonlabored ventilation, respiratory function stable and patient connected to nasal cannula oxygen Cardiovascular status: blood pressure returned to baseline and stable Postop Assessment: no apparent nausea or vomiting Anesthetic complications: no   No notable events documented.  Last Vitals:  Vitals:   04/27/22 0857 04/27/22 1052  BP:  (!) 143/68  Pulse:  65  Resp:  19  Temp:  36.8 C  SpO2: 96% 97%    Last Pain:  Vitals:   04/27/22 1052  TempSrc: Oral  PainSc:                  Tayshun Gappa

## 2022-04-27 NOTE — Progress Notes (Signed)
SATURATION QUALIFICATIONS: (This note is used to comply with regulatory documentation for home oxygen)  Patient Saturations on Room Air at Rest = 89%  Patient Saturations on Room Air while Ambulating = 86%  Patient Saturations on 2 Liters of oxygen while Ambulating = 91%  Please briefly explain why patient needs home oxygen:

## 2022-05-01 ENCOUNTER — Other Ambulatory Visit (HOSPITAL_BASED_OUTPATIENT_CLINIC_OR_DEPARTMENT_OTHER): Payer: Self-pay

## 2022-05-04 ENCOUNTER — Encounter: Payer: Self-pay | Admitting: Pulmonary Disease

## 2022-05-04 ENCOUNTER — Telehealth: Payer: Self-pay | Admitting: Pulmonary Disease

## 2022-05-04 ENCOUNTER — Other Ambulatory Visit: Payer: PPO

## 2022-05-04 ENCOUNTER — Ambulatory Visit: Payer: PPO | Admitting: Internal Medicine

## 2022-05-04 NOTE — Telephone Encounter (Signed)
PT just release from hospital after lung surgery w/rolling O2. Wonders if we can RX the Portable O2 that goes over the shoulder "like a womans purse". Please call to advise.   Dr. Elsworth Soho please advise, patient was seen in [place] location on 04/03/2022, are you okay with [place] ordering an Inogen for him?

## 2022-05-05 ENCOUNTER — Ambulatory Visit: Payer: PPO | Admitting: Oncology

## 2022-05-05 ENCOUNTER — Other Ambulatory Visit: Payer: Self-pay

## 2022-05-05 ENCOUNTER — Other Ambulatory Visit: Payer: PPO

## 2022-05-05 DIAGNOSIS — Z122 Encounter for screening for malignant neoplasm of respiratory organs: Secondary | ICD-10-CM

## 2022-05-05 NOTE — Telephone Encounter (Signed)
Called and spoke to patient.  He states he sees Psychologist, sport and exercise on Friday and will talk to him about ordering new Portable oxygen.  He states he is okay with smaller oxygen tanks and insurance will not cover inogen.  He states he will call back and ask for me if the surgeon cannot help with ordering his O2 and we can ask Dr.Alva about ordering different tanks from there.

## 2022-05-06 ENCOUNTER — Inpatient Hospital Stay (HOSPITAL_BASED_OUTPATIENT_CLINIC_OR_DEPARTMENT_OTHER): Payer: PPO | Admitting: Internal Medicine

## 2022-05-06 ENCOUNTER — Encounter: Payer: Self-pay | Admitting: Internal Medicine

## 2022-05-06 ENCOUNTER — Inpatient Hospital Stay: Payer: PPO | Attending: Internal Medicine

## 2022-05-06 VITALS — BP 122/53 | HR 69 | Temp 98.3°F | Resp 18 | Wt 227.2 lb

## 2022-05-06 DIAGNOSIS — I10 Essential (primary) hypertension: Secondary | ICD-10-CM | POA: Diagnosis not present

## 2022-05-06 DIAGNOSIS — Z800 Family history of malignant neoplasm of digestive organs: Secondary | ICD-10-CM

## 2022-05-06 DIAGNOSIS — E119 Type 2 diabetes mellitus without complications: Secondary | ICD-10-CM

## 2022-05-06 DIAGNOSIS — C3411 Malignant neoplasm of upper lobe, right bronchus or lung: Secondary | ICD-10-CM

## 2022-05-06 DIAGNOSIS — Z122 Encounter for screening for malignant neoplasm of respiratory organs: Secondary | ICD-10-CM

## 2022-05-06 DIAGNOSIS — C3490 Malignant neoplasm of unspecified part of unspecified bronchus or lung: Secondary | ICD-10-CM

## 2022-05-06 DIAGNOSIS — Z87891 Personal history of nicotine dependence: Secondary | ICD-10-CM

## 2022-05-06 LAB — CBC WITH DIFFERENTIAL/PLATELET
Abs Immature Granulocytes: 0.03 10*3/uL (ref 0.00–0.07)
Basophils Absolute: 0 10*3/uL (ref 0.0–0.1)
Basophils Relative: 0 %
Eosinophils Absolute: 0.1 10*3/uL (ref 0.0–0.5)
Eosinophils Relative: 1 %
HCT: 43.3 % (ref 39.0–52.0)
Hemoglobin: 14.5 g/dL (ref 13.0–17.0)
Immature Granulocytes: 0 %
Lymphocytes Relative: 18 %
Lymphs Abs: 1.6 10*3/uL (ref 0.7–4.0)
MCH: 31.1 pg (ref 26.0–34.0)
MCHC: 33.5 g/dL (ref 30.0–36.0)
MCV: 92.9 fL (ref 80.0–100.0)
Monocytes Absolute: 0.6 10*3/uL (ref 0.1–1.0)
Monocytes Relative: 6 %
Neutro Abs: 6.8 10*3/uL (ref 1.7–7.7)
Neutrophils Relative %: 75 %
Platelets: 250 10*3/uL (ref 150–400)
RBC: 4.66 MIL/uL (ref 4.22–5.81)
RDW: 13.8 % (ref 11.5–15.5)
WBC: 9.2 10*3/uL (ref 4.0–10.5)
nRBC: 0 % (ref 0.0–0.2)

## 2022-05-06 LAB — COMPREHENSIVE METABOLIC PANEL
ALT: 16 U/L (ref 0–44)
AST: 16 U/L (ref 15–41)
Albumin: 3.8 g/dL (ref 3.5–5.0)
Alkaline Phosphatase: 83 U/L (ref 38–126)
Anion gap: 7 (ref 5–15)
BUN: 12 mg/dL (ref 8–23)
CO2: 30 mmol/L (ref 22–32)
Calcium: 9 mg/dL (ref 8.9–10.3)
Chloride: 100 mmol/L (ref 98–111)
Creatinine, Ser: 0.78 mg/dL (ref 0.61–1.24)
GFR, Estimated: >60 mL/min (ref >60)
Glucose, Bld: 163 mg/dL — ABNORMAL HIGH (ref 70–99)
Potassium: 4.6 mmol/L (ref 3.5–5.1)
Sodium: 137 mmol/L (ref 135–145)
Total Bilirubin: 0.7 mg/dL (ref 0.3–1.2)
Total Protein: 6.7 g/dL (ref 6.5–8.1)

## 2022-05-06 NOTE — Progress Notes (Signed)
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 1.7 cm (3/59), enlarged from 11/05/2021, at which time it measured 5 x 9 mm. On 09/24/2020, it measured 4 mm and is new from 04/15/2016. Subpleural scarring inferiorly within the posterior segment right upper lobe. Additional pleuroparenchymal scarring in the right middle lobe. No pleural fluid. Airway is unremarkable. Upper Abdomen: Liver margin is slightly irregular. Subcentimeter low-attenuation lesion in the  left hepatic lobe, too small to characterize. Visualized portions of the liver, adrenal glands, left kidney, spleen, pancreas and stomach are otherwise grossly unremarkable. No upper abdominal adenopathy. Musculoskeletal: Degenerative and postoperative changes in the spine. Flowing anterior osteophytosis in the thoracic spine. IMPRESSION: 1. Enlarging irregular right upper lobe nodule, highly worrisome for indolent adenocarcinoma. 2. Liver appears mildly cirrhotic. 3. Aortic atherosclerosis (ICD10-I70.0). Coronary artery calcification. 4. Enlarged pulmonic trunk, indicative of  pulmonary arterial hypertension. 5.  Emphysema (ICD10-J43.9). Electronically Signed   By: Lorin Picket M.D.   On: 04/09/2022 12:28    ASSESSMENT: This is a very pleasant 77 years old white male recently diagnosed with a stage Ib (T2, N0, M0) non-small cell lung cancer, squamous cell carcinoma presented with right upper lobe lung nodule diagnosed and January 2024.  The patient is status post right upper lobectomy with lymph node sampling under the care of Dr. Kipp Brood on April 23, 2022 and the final pathology revealed 1.0 cm squamous cell carcinoma with evidence for visceral pleural invasion and lymphovascular invasion.   PLAN: I had a lengthy discussion with the patient and his wife today about his current disease stage, prognosis and treatment options. I explained to the patient that he did receive a curative option for his disease with the surgical resection. I also explained to the patient that the 5-year survival for patient with a stage Ib is seen in the range of 60-80% after surgical resection. I also explained to the patient that there is no survival benefit for adjuvant systemic chemotherapy, radiation, targeted therapy or immunotherapy for patient with stage Ib non-small cell lung cancer with tumor size less than 3.0 cm. I recommended for the patient to continue on observation with repeat CT scan of the chest in 6 months. He was advised to call immediately if he has any other concerning symptoms in the interval. The patient and his wife are in agreement with the current plan. The patient voices understanding of current disease status and treatment options and is in agreement with the current care plan.  All questions were answered. The patient knows to call the clinic with any problems, questions or concerns. We can certainly see the patient much sooner if necessary.  Thank you so much for allowing me to participate in the care of Howard Maxwell. I will continue to follow up the  patient with you and assist in his care.  The total time spent in the appointment was 60 minutes.  Disclaimer: This note was dictated with voice recognition software. Similar sounding words can inadvertently be transcribed and may not be corrected upon review.   Eilleen Kempf May 06, 2022, 2:11 PM

## 2022-05-07 ENCOUNTER — Telehealth: Payer: Self-pay | Admitting: Podiatry

## 2022-05-07 DIAGNOSIS — J449 Chronic obstructive pulmonary disease, unspecified: Secondary | ICD-10-CM | POA: Diagnosis not present

## 2022-05-07 DIAGNOSIS — G4734 Idiopathic sleep related nonobstructive alveolar hypoventilation: Secondary | ICD-10-CM | POA: Diagnosis not present

## 2022-05-07 NOTE — Telephone Encounter (Signed)
Fax documentation for prior auth to [employer] for diabetic shoes and inserts .

## 2022-05-08 ENCOUNTER — Encounter: Payer: Self-pay | Admitting: Thoracic Surgery (Cardiothoracic Vascular Surgery)

## 2022-05-08 ENCOUNTER — Ambulatory Visit (INDEPENDENT_AMBULATORY_CARE_PROVIDER_SITE_OTHER): Payer: PPO | Admitting: Thoracic Surgery (Cardiothoracic Vascular Surgery)

## 2022-05-08 VITALS — BP 146/84 | HR 64 | Resp 18 | Ht 70.0 in | Wt 226.0 lb

## 2022-05-08 DIAGNOSIS — Z902 Acquired absence of lung [part of]: Secondary | ICD-10-CM

## 2022-05-08 NOTE — Progress Notes (Signed)
[address]       [place] [address]             [phone]        Howard Maxwell [facility] Medical Record #[id number] Date of Birth: [birth year]  Referring: Garner Nash, DO Primary Care: Tracie Harrier, MD Primary Cardiologist:Muhammad Fletcher Anon, MD  Reason for visit:   follow-up  History of Present Illness:     77 year old male presents for his first follow-up appointment after undergoing a lobectomy.  Overall he is doing well.  He has no complaints today.  He is now requiring oxygen with ambulation.  Physical Exam: BP (!) 146/84 (BP Location: Right Arm, Patient Position: Sitting)   Pulse 64   Resp 18   Ht 5\' 10"  (1.778 m)   Wt 226 lb (102.5 kg)   SpO2 92% Comment: RA  BMI 32.43 kg/m   Alert NAD Incision clean.  S Abdomen, ND No peripheral edema   Diagnostic Studies & Laboratory data:  Path:  FINAL MICROSCOPIC DIAGNOSIS:   A. LYMPH NODE, LEVEL 9, EXCISION:  - Lymph node, negative for carcinoma (0/1)   B. LYMPH NODE, HILAR, EXCISION:  - Lymph node, negative for carcinoma (0/1)   C. LYMPH NODE, HILAR #2, EXCISION:  - Lymph node, negative for carcinoma (0/1)   D. LYMPH NODE, HILAR #3, EXCISION:  - Lymph node, negative for carcinoma (0/1)   E. LYMPH NODE, HILAR #4, EXCISION:  - Lymph node, negative for carcinoma (0/1)   F. LYMPH NODE, LEVEL 7, EXCISION:  - Lymph node, negative for carcinoma (0/1)   G. LYMPH NODE, HILAR #5, EXCISION:  - Lymph node, negative for carcinoma (0/1)   H. LUNG, RIGHT UPPER LOBE, LOBECTOMY:  - Invasive moderately differentiated squamous cell carcinoma, 1 cm  - Carcinoma invades into the visceral pleural surface  - Lymphovascular invasion is identified  - Resection margins are negative for carcinoma  - See oncology table      ONCOLOGY TABLE:   LUNG: Resection   Synchronous Tumors: Not applicable  Total Number of Primary Tumors: 1  Procedure: Lobectomy, lung  Specimen Laterality: Right   Tumor Focality: Unifocal  Tumor Site: Upper lobe  Tumor Size: 1 cm  Histologic Type: Squamous cell carcinoma  Visceral Pleura Invasion: Present  Direct Invasion of Adjacent Structures: No adjacent structures present  Lymphovascular Invasion: Present  Margins: All margins negative for invasive carcinoma       Closest Margin(s) to Invasive Carcinoma: Bronchovascular margin       Margin(s) Involved by Invasive Carcinoma: Not applicable        Margin Status for Non-Invasive Tumor: Not applicable  Treatment Effect: No known presurgical therapy  Regional Lymph Nodes:       Number of Lymph Nodes Involved: 0                            Nodal Sites with Tumor: Not applicable       Number of Lymph Nodes Examined: 7                       Nodal Sites Examined: Levels 7, 9, 10  Distant Metastasis:       Distant Site(s) Involved: Not applicable  Pathologic Stage Classification (pTNM, AJCC 8th Edition): pT2, pN0     Assessment / Plan:   77 year old male status post right upper lobectomy for T2 N0  M0 stage Ib squamous cell carcinoma.  He will follow-up in 1 month with a chest x-ray.  This case has already been discussed in tumor board.   Lajuana Matte 05/08/2022 3:54 PM

## 2022-05-17 ENCOUNTER — Other Ambulatory Visit (HOSPITAL_COMMUNITY): Payer: Self-pay

## 2022-05-18 ENCOUNTER — Other Ambulatory Visit: Payer: Self-pay

## 2022-05-18 ENCOUNTER — Other Ambulatory Visit (HOSPITAL_COMMUNITY): Payer: Self-pay

## 2022-05-18 MED ORDER — EMPAGLIFLOZIN 10 MG PO TABS
10.0000 mg | ORAL_TABLET | Freq: Every day | ORAL | 2 refills | Status: DC
  Filled 2022-05-18: qty 90, fill #0
  Filled 2022-07-31: qty 90, 90d supply, fill #0
  Filled 2022-11-17: qty 90, 90d supply, fill #1
  Filled 2022-12-04 – 2023-01-06 (×2): qty 30, 30d supply, fill #1
  Filled 2023-02-16: qty 30, 30d supply, fill #2
  Filled 2023-03-26: qty 90, 90d supply, fill #3
  Filled 2023-05-13: qty 30, 30d supply, fill #4

## 2022-05-18 MED ORDER — METFORMIN HCL 850 MG PO TABS
850.0000 mg | ORAL_TABLET | Freq: Two times a day (BID) | ORAL | 1 refills | Status: DC
  Filled 2022-05-18: qty 180, fill #0
  Filled 2022-07-31: qty 180, 90d supply, fill #0
  Filled 2022-11-17: qty 180, 90d supply, fill #1

## 2022-05-18 MED ORDER — GLUCOSE BLOOD VI STRP
ORAL_STRIP | 3 refills | Status: DC
  Filled 2022-05-18: qty 200, 100d supply, fill #0
  Filled 2022-08-03: qty 200, 100d supply, fill #1
  Filled 2022-11-30: qty 200, 100d supply, fill #2
  Filled 2023-02-15: qty 200, 100d supply, fill #3

## 2022-05-19 ENCOUNTER — Other Ambulatory Visit: Payer: Self-pay

## 2022-05-22 ENCOUNTER — Ambulatory Visit (INDEPENDENT_AMBULATORY_CARE_PROVIDER_SITE_OTHER): Payer: PPO

## 2022-05-22 DIAGNOSIS — L97521 Non-pressure chronic ulcer of other part of left foot limited to breakdown of skin: Secondary | ICD-10-CM | POA: Diagnosis not present

## 2022-05-22 DIAGNOSIS — E1165 Type 2 diabetes mellitus with hyperglycemia: Secondary | ICD-10-CM

## 2022-05-25 DIAGNOSIS — Q111 Other anophthalmos: Secondary | ICD-10-CM | POA: Diagnosis not present

## 2022-05-25 DIAGNOSIS — D3131 Benign neoplasm of right choroid: Secondary | ICD-10-CM | POA: Diagnosis not present

## 2022-05-29 NOTE — Telephone Encounter (Signed)
1 

## 2022-06-01 ENCOUNTER — Ambulatory Visit (INDEPENDENT_AMBULATORY_CARE_PROVIDER_SITE_OTHER): Payer: PPO | Admitting: Pulmonary Disease

## 2022-06-01 ENCOUNTER — Encounter (HOSPITAL_BASED_OUTPATIENT_CLINIC_OR_DEPARTMENT_OTHER): Payer: Self-pay | Admitting: Pulmonary Disease

## 2022-06-01 VITALS — BP 118/70 | HR 61 | Temp 98.4°F | Ht 70.0 in | Wt 223.5 lb

## 2022-06-01 DIAGNOSIS — J9611 Chronic respiratory failure with hypoxia: Secondary | ICD-10-CM | POA: Diagnosis not present

## 2022-06-01 DIAGNOSIS — G4733 Obstructive sleep apnea (adult) (pediatric): Secondary | ICD-10-CM

## 2022-06-01 DIAGNOSIS — J4489 Other specified chronic obstructive pulmonary disease: Secondary | ICD-10-CM | POA: Diagnosis not present

## 2022-06-01 DIAGNOSIS — J439 Emphysema, unspecified: Secondary | ICD-10-CM

## 2022-06-01 DIAGNOSIS — C3491 Malignant neoplasm of unspecified part of right bronchus or lung: Secondary | ICD-10-CM | POA: Diagnosis not present

## 2022-06-01 NOTE — Progress Notes (Signed)
Patient presents today to pick up diabetic shoes and insoles.  Patient was dispensed 1 pair of diabetic shoes and 3 pairs of foam casted diabetic insoles.   He tried on the shoes with the insoles and the fit was satisfactory.   Will follow up next year for new order.    

## 2022-06-01 NOTE — Assessment & Plan Note (Signed)
Status post right upper lobectomy. He will need to surveillance CT in 6 months August 2024

## 2022-06-01 NOTE — Assessment & Plan Note (Signed)
He is compliant with inspire device set at 2.1 V. If nocturnal oximetry shows significant desaturation related OSA, he may need retitration study
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
## 2022-06-01 NOTE — Assessment & Plan Note (Addendum)
He does desaturate to 90% but does not need oxygen on exertion. We will reassess nocturnal hypoxia with total oximetry on room air/using his inspire device.  Depending on nature of desaturation we will try to discern whether this is related to OSA or lung disease Referral to pulmonary rehab program in [place]

## 2022-06-01 NOTE — Assessment & Plan Note (Signed)
Continue on Anoro. Use albuterol for rescue.  We discussed COPD action plan and signs and symptoms of COPD exacerbation

## 2022-06-01 NOTE — Patient Instructions (Signed)
X Amb sat  X ONO on inspire /RA to decide about oxygen use during sleep  X Pulm rehab in [place]

## 2022-06-02 ENCOUNTER — Ambulatory Visit: Payer: PPO | Attending: Cardiovascular Disease | Admitting: Cardiovascular Disease

## 2022-06-02 ENCOUNTER — Encounter: Payer: Self-pay | Admitting: Cardiovascular Disease

## 2022-06-02 VITALS — BP 112/60 | HR 58 | Ht 70.0 in | Wt 225.0 lb

## 2022-06-02 DIAGNOSIS — I2510 Atherosclerotic heart disease of native coronary artery without angina pectoris: Secondary | ICD-10-CM

## 2022-06-02 DIAGNOSIS — I471 Supraventricular tachycardia, unspecified: Secondary | ICD-10-CM

## 2022-06-02 DIAGNOSIS — R200 Anesthesia of skin: Secondary | ICD-10-CM | POA: Diagnosis not present

## 2022-06-02 DIAGNOSIS — Z6833 Body mass index (BMI) 33.0-33.9, adult: Secondary | ICD-10-CM | POA: Diagnosis not present

## 2022-06-02 DIAGNOSIS — I10 Essential (primary) hypertension: Secondary | ICD-10-CM

## 2022-06-02 DIAGNOSIS — I952 Hypotension due to drugs: Secondary | ICD-10-CM | POA: Diagnosis not present

## 2022-06-02 DIAGNOSIS — E1165 Type 2 diabetes mellitus with hyperglycemia: Secondary | ICD-10-CM | POA: Diagnosis not present

## 2022-06-02 DIAGNOSIS — M5136 Other intervertebral disc degeneration, lumbar region: Secondary | ICD-10-CM | POA: Diagnosis not present

## 2022-06-02 DIAGNOSIS — I700 Atherosclerosis of aorta: Secondary | ICD-10-CM | POA: Diagnosis not present

## 2022-06-02 DIAGNOSIS — E785 Hyperlipidemia, unspecified: Secondary | ICD-10-CM
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
## 2022-06-02 NOTE — Patient Instructions (Signed)
Medication Instructions:  No changes *If you need a refill on your cardiac medications before your next appointment, please call your pharmacy*   Lab Work: None ordered If you have labs (blood work) drawn today and your tests are completely normal, you will receive your results only by: MyChart Message (if you have MyChart) OR A paper copy in the mail If you have any lab test that is abnormal or we need to change your treatment, we will call you to review the results.   Testing/Procedures: None ordered   Follow-Up: At [facility], you and your health needs are our priority.  As part of our continuing mission to provide you with exceptional heart care, we have created designated Provider Care Teams.  These Care Teams include your primary Cardiologist (physician) and Advanced Practice Providers (APPs -  Physician Assistants and Nurse Practitioners) who all work together to provide you with the care you need, when you need it.  We recommend signing up for the patient portal called "MyChart".  Sign up information is provided on this After Visit Summary.  MyChart is used to connect with patients for Virtual Visits (Telemedicine).  Patients are able to view lab/test results, encounter notes, upcoming appointments, etc.  Non-urgent messages can be sent to your provider as well.   To learn more about what you can do with MyChart, go to [email or web address].    Your next appointment:   6 month(s)  Provider:   You may see Muhammad Arida, MD or one of the following Advanced Practice Providers on your designated Care Team:   Christopher Berge, NP Ryan Dunn, PA-C Cadence Furth, PA-C Sheri Hammock, NP    

## 2022-06-03 ENCOUNTER — Ambulatory Visit (HOSPITAL_BASED_OUTPATIENT_CLINIC_OR_DEPARTMENT_OTHER): Payer: PPO | Admitting: Pulmonary Disease

## 2022-06-08 DIAGNOSIS — Z6833 Body mass index (BMI) 33.0-33.9, adult: Secondary | ICD-10-CM | POA: Diagnosis not present

## 2022-06-08 DIAGNOSIS — F330 Major depressive disorder, recurrent, mild: Secondary | ICD-10-CM | POA: Diagnosis not present

## 2022-06-08 DIAGNOSIS — I700 Atherosclerosis of aorta: Secondary | ICD-10-CM | POA: Diagnosis not present

## 2022-06-08 DIAGNOSIS — Z902 Acquired absence of lung [part of]: Secondary | ICD-10-CM | POA: Diagnosis not present

## 2022-06-08 DIAGNOSIS — R059 Cough, unspecified: Secondary | ICD-10-CM | POA: Diagnosis not present

## 2022-06-08 DIAGNOSIS — I2510 Atherosclerotic heart disease of native coronary artery without angina pectoris: Secondary | ICD-10-CM | POA: Diagnosis not present

## 2022-06-08 DIAGNOSIS — J028 Acute pharyngitis due to other specified organisms: Secondary | ICD-10-CM | POA: Diagnosis not present

## 2022-06-08 DIAGNOSIS — C3491 Malignant neoplasm of unspecified part of right bronchus or lung: Secondary | ICD-10-CM | POA: Diagnosis not present

## 2022-06-08 DIAGNOSIS — M5136 Other intervertebral disc degeneration, lumbar region: Secondary | ICD-10-CM | POA: Diagnosis not present

## 2022-06-08 DIAGNOSIS — R053 Chronic cough: Secondary | ICD-10-CM | POA: Diagnosis not present

## 2022-06-08 DIAGNOSIS — J90 Pleural effusion, not elsewhere classified: Secondary | ICD-10-CM | POA: Diagnosis not present

## 2022-06-08 DIAGNOSIS — J111 Influenza due to unidentified influenza virus with other respiratory manifestations: Secondary | ICD-10-CM | POA: Diagnosis not present

## 2022-06-08 DIAGNOSIS — R0981 Nasal congestion: Secondary | ICD-10-CM | POA: Diagnosis not present

## 2022-06-08 DIAGNOSIS — Z87891 Personal history of nicotine dependence: Secondary | ICD-10-CM | POA: Diagnosis not present

## 2022-06-08 DIAGNOSIS — J209 Acute bronchitis, unspecified: Secondary | ICD-10-CM | POA: Diagnosis not present

## 2022-06-08 NOTE — Patient Instructions (Signed)
____________________________________________________________________________________________  Opioid Pain Medication Update  To: All patients taking opioid pain medications. (I.e.: hydrocodone, hydromorphone, oxycodone, oxymorphone, morphine, codeine, methadone, tapentadol, tramadol, buprenorphine, fentanyl, etc.)  Re: Updated review of side effects and adverse reactions of opioid analgesics, as well as new information about long term effects of this class of medications.  Direct risks of long-term opioid therapy are not limited to opioid addiction and overdose. Potential medical risks include serious fractures, breathing problems during sleep, hyperalgesia, immunosuppression, chronic constipation, bowel obstruction, myocardial infarction, and tooth decay secondary to xerostomia.  Unpredictable adverse effects that can occur even if you take your medication correctly: Cognitive impairment, respiratory depression, and death. Most people think that if they take their medication "correctly", and "as instructed", that they will be safe. Nothing could be farther from the truth. In reality, a significant amount of recorded deaths associated with the use of opioids has occurred in individuals that had taken the medication for a long time, and were taking their medication correctly. The following are examples of how this can happen: Patient taking his/her medication for a long time, as instructed, without any side effects, is given a certain antibiotic or another unrelated medication, which in turn triggers a "Drug-to-drug interaction" leading to disorientation, cognitive impairment, impaired reflexes, respiratory depression or an untoward event leading to serious bodily harm or injury, including death.  Patient taking his/her medication for a long time, as instructed, without any side effects, develops an acute impairment of liver and/or kidney function. This will lead to a rapid inability of the body to  breakdown and eliminate their pain medication, which will result in effects similar to an "overdose", but with the same medicine and dose that they had always taken. This again may lead to disorientation, cognitive impairment, impaired reflexes, respiratory depression or an untoward event leading to serious bodily harm or injury, including death.  A similar problem will occur with patients as they grow older and their liver and kidney function begins to decrease as part of the aging process.  Background information: Historically, the original case for using long-term opioid therapy to treat chronic noncancer pain was based on safety assumptions that subsequent experience has called into question. In 1996, the American Pain Society and the American Academy of Pain Medicine issued a consensus statement supporting long-term opioid therapy. This statement acknowledged the dangers of opioid prescribing but concluded that the risk for addiction was low; respiratory depression induced by opioids was short-lived, occurred mainly in opioid-naive patients, and was antagonized by pain; tolerance was not a common problem; and efforts to control diversion should not constrain opioid prescribing. This has now proven to be wrong. Experience regarding the risks for opioid addiction, misuse, and overdose in community practice has failed to support these assumptions.  According to the Centers for Disease Control and Prevention, fatal overdoses involving opioid analgesics have increased sharply over the past decade. Currently, more than 96,700 people die from drug overdoses every year. Opioids are a factor in 7 out of every 10 overdose deaths. Deaths from drug overdose have surpassed motor vehicle accidents as the leading cause of death for individuals between the ages of 35 and 54.  Clinical data suggest that neuroendocrine dysfunction may be very common in both men and women, potentially causing hypogonadism, erectile  dysfunction, infertility, decreased libido, osteoporosis, and depression. Recent studies linked higher opioid dose to increased opioid-related mortality. Controlled observational studies reported that long-term opioid therapy may be associated with increased risk for cardiovascular events. Subsequent meta-analysis concluded   that the safety of long-term opioid therapy in elderly patients has not been proven.   Side Effects and adverse reactions: Common side effects: Drowsiness (sedation). Dizziness. Nausea and vomiting. Constipation. Physical dependence -- Dependence often manifests with withdrawal symptoms when opioids are discontinued or decreased. Tolerance -- As you take repeated doses of opioids, you require increased medication to experience the same effect of pain relief. Respiratory depression -- This can occur in healthy people, especially with higher doses. However, people with COPD, asthma or other lung conditions may be even more susceptible to fatal respiratory impairment.  Uncommon side effects: An increased sensitivity to feeling pain and extreme response to pain (hyperalgesia). Chronic use of opioids can lead to this. Delayed gastric emptying (the process by which the contents of your stomach are moved into your small intestine). Muscle rigidity. Immune system and hormonal dysfunction. Quick, involuntary muscle jerks (myoclonus). Arrhythmia. Itchy skin (pruritus). Dry mouth (xerostomia).  Long-term side effects: Chronic constipation. Sleep-disordered breathing (SDB). Increased risk of bone fractures. Hypothalamic-pituitary-adrenal dysregulation. Increased risk of overdose.  RISKS: Fractures and Falls:  Opioids increase the risk and incidence of falls. This is of particular importance in elderly patients.  Endocrine System:  Long-term administration is associated with endocrine abnormalities (endocrinopathies). (Also known as Opioid-induced Endocrinopathy) Influences  on both the hypothalamic-pituitary-adrenal axis?and the hypothalamic-pituitary-gonadal axis have been demonstrated with consequent hypogonadism and adrenal insufficiency in both sexes. Hypogonadism and decreased levels of dehydroepiandrosterone sulfate have been reported in men and women. Endocrine effects include: Amenorrhoea in women (abnormal absence of menstruation) Reduced libido in both sexes Decreased sexual function Erectile dysfunction in men Hypogonadisms (decreased testicular function with shrinkage of testicles) Infertility Depression and fatigue Loss of muscle mass Anxiety Depression Immune suppression Hyperalgesia Weight gain Anemia Osteoporosis Patients (particularly women of childbearing age) should avoid opioids. There is insufficient evidence to recommend routine monitoring of asymptomatic patients taking opioids in the long-term for hormonal deficiencies.  Immune System: Human studies have demonstrated that opioids have an immunomodulating effect. These effects are mediated via opioid receptors both on immune effector cells and in the central nervous system. Opioids have been demonstrated to have adverse effects on antimicrobial response and anti-tumour surveillance. Buprenorphine has been demonstrated to have no impact on immune function.  Opioid Induced Hyperalgesia: Human studies have demonstrated that prolonged use of opioids can lead to a state of abnormal pain sensitivity, sometimes called opioid induced hyperalgesia (OIH). Opioid induced hyperalgesia is not usually seen in the absence of tolerance to opioid analgesia. Clinically, hyperalgesia may be diagnosed if the patient on long-term opioid therapy presents with increased pain. This might be qualitatively and anatomically distinct from pain related to disease progression or to breakthrough pain resulting from development of opioid tolerance. Pain associated with hyperalgesia tends to be more diffuse than the  pre-existing pain and less defined in quality. Management of opioid induced hyperalgesia requires opioid dose reduction.  Cancer: Chronic opioid therapy has been associated with an increased risk of cancer among noncancer patients with chronic pain. This association was more evident in chronic strong opioid users. Chronic opioid consumption causes significant pathological changes in the small intestine and colon. Epidemiological studies have found that there is a link between opium dependence and initiation of gastrointestinal cancers. Cancer is the second leading cause of death after cardiovascular disease. Chronic use of opioids can cause multiple conditions such as GERD, immunosuppression and renal damage as well as carcinogenic effects, which are associated with the incidence of cancers.   Mortality: Long-term opioid use   has been associated with increased mortality among patients with chronic non-cancer pain (CNCP).  Prescription of long-acting opioids for chronic noncancer pain was associated with a significantly increased risk of all-cause mortality, including deaths from causes other than overdose.  Reference: Von Korff M, Kolodny A, Deyo RA, Chou R. Long-term opioid therapy reconsidered. Ann Intern Med. 2011 Sep 6;155(5):325-8. doi: 10.7326/[id number]. PMID: 21893626; PMCID: PMC3280085. Bedson J, Chen Y, Ashworth J, Hayward RA, Dunn KM, Jordan KP. Risk of adverse events in patients prescribed long-term opioids: A cohort study in the [place] Clinical Practice Research Datalink. Eur J Pain. 2019 May;23(5):908-922. doi: 10.1002/ejp.1357. Epub 2019 Jan 31. PMID: 30620116. Colameco S, Coren JS, Ciervo CA. Continuous opioid treatment for chronic noncancer pain: a time for moderation in prescribing. Postgrad Med. 2009 Jul;121(4):61-6. doi: 10.3810/pgm.2009.07.2032. PMID: 19641271. Chou R, Turner JA, Devine EB, Hansen RN, Sullivan SD, Blazina I, Dana T, Bougatsos C, Deyo RA. The  effectiveness and risks of long-term opioid therapy for chronic pain: a systematic review for a National Institutes of Health Pathways to Prevention Workshop. Ann Intern Med. 2015 Feb 17;162(4):276-86. doi: 10.7326/M14-2559. PMID: 25581257. Warner M, Chen LH, Makuc DM. NCHS Data Brief No. 22. [place]: Centers for Disease Control and Prevention; 2009. Sep, Increase in Fatal Poisonings Involving Opioid Analgesics in the [place], 1999-2006. Song IA, Choi HR, Oh TK. Long-term opioid use and mortality in patients with chronic non-cancer pain: Ten-year follow-up study in [place] from 2010 through 2019. EClinicalMedicine. 2022 Jul 18;51:101558. doi: 10.1016/j.eclinm.2022.101558. PMID: 35875817; PMCID: PMC9304910. Huser, W., Schubert, T., Vogelmann, T. et al. All-cause mortality in patients with long-term opioid therapy compared with non-opioid analgesics for chronic non-cancer pain: a database study. BMC Med 18, 162 (2020). https://doi.org/10.1186/[email or web address] Rashidian H, Zendehdel K, Kamangar F, Malekzadeh R, Haghdoost AA. An Ecological Study of the Association between Opiate Use and Incidence of Cancers. Addict Health. 2016 Fall;8(4):252-260. PMID: 28819556; PMCID: PMC5554805.  Our Goal: Our goal is to control your pain with means other than the use of opioid pain medications.  Our Recommendation: Talk to your physician about coming off of these medications. We can assist you with the tapering down and stopping these medicines. Based on the new information, even if you cannot completely stop the medication, a decrease in the dose may be associated with a lesser risk. Ask for other means of controlling the pain. Decrease or eliminate those factors that significantly contribute to your pain such as smoking, obesity, and a diet heavily tilted towards "inflammatory" nutrients.  Last Updated: 05/20/2022    ____________________________________________________________________________________________     ____________________________________________________________________________________________  Patient Information update  To: All of our patients.  Re: Name change.  It has been made official that our current name, "[facility]"   will soon be changed to "[facility]".   The purpose of this change is to eliminate any confusion created by the concept of our practice being a "Medication Management Pain Clinic". In the past this has led to the misconception that we treat pain primarily by the use of prescription medications.  Nothing can be farther from the truth.   Understanding PAIN MANAGEMENT: To further understand what our practice does, you first have to understand that "Pain Management" is a subspecialty that requires additional training once a physician has completed their specialty training, which can be in either Anesthesia, Neurology, Psychiatry, or Physical Medicine and Rehabilitation (PMR). Each one of these contributes to the final approach taken by each physician to   the management of their patient's pain. To be a "Pain Management Specialist" you must have first completed one of the specialty trainings below.  Anesthesiologists - trained in clinical pharmacology and interventional techniques such as nerve blockade and regional as well as central neuroanatomy. They are trained to block pain before, during, and after surgical interventions.  Neurologists - trained in the diagnosis and pharmacological treatment of complex neurological conditions, such as Multiple Sclerosis, Parkinson's, spinal cord injuries, and other systemic conditions that may be associated with symptoms that may include but are not limited to pain. They tend to rely primarily on the treatment of chronic pain  using prescription medications.  Psychiatrist - trained in conditions affecting the psychosocial wellbeing of patients including but not limited to depression, anxiety, schizophrenia, personality disorders, addiction, and other substance use disorders that may be associated with chronic pain. They tend to rely primarily on the treatment of chronic pain using prescription medications.   Physical Medicine and Rehabilitation (PMR) physicians, also known as physiatrists - trained to treat a wide variety of medical conditions affecting the brain, spinal cord, nerves, bones, joints, ligaments, muscles, and tendons. Their training is primarily aimed at treating patients that have suffered injuries that have caused severe physical impairment. Their training is primarily aimed at the physical therapy and rehabilitation of those patients. They may also work alongside orthopedic surgeons or neurosurgeons using their expertise in assisting surgical patients to recover after their surgeries.  INTERVENTIONAL PAIN MANAGEMENT is sub-subspecialty of Pain Management.  Our physicians are Board-certified in Anesthesia, Pain Management, and Interventional Pain Management.  This meaning that not only have they been trained and Board-certified in their specialty of Anesthesia, and subspecialty of Pain Management, but they have also received further training in the sub-subspecialty of Interventional Pain Management, in order to become Board-certified as INTERVENTIONAL PAIN MANAGEMENT SPECIALIST.    Mission: Our goal is to use our skills in  INTERVENTIONAL PAIN MANAGEMENT as alternatives to the chronic use of prescription opioid medications for the treatment of pain. To make this more clear, we have changed our name to reflect what we do and offer. We will continue to offer medication management assessment and recommendations, but we will not be taking over any patient's medication  management.  ____________________________________________________________________________________________     ____________________________________________________________________________________________  National Pain Medication Shortage  The [place] is experiencing worsening drug shortages. These have had a negative widespread effect on patient care and treatment. Not expected to improve any time soon. Predicted to last past 2029.   Drug shortage list (generic names) Oxycodone IR Oxycodone/APAP Oxymorphone IR Hydromorphone Hydrocodone/APAP Morphine  Where is the problem?  Manufacturing and supply level.  Will this shortage affect you?  Only if you take any of the above pain medications.  How? You may be unable to fill your prescription.  Your pharmacist may offer a "partial fill" of your prescription. (Warning: Do not accept partial fills.) Prescriptions partially filled cannot be transferred to another pharmacy. Read our Medication Rules and Regulation. Depending on how much medicine you are dependent on, you may experience withdrawals when unable to get the medication.  Recommendations: Consider ending your dependence on opioid pain medications. Ask your pain specialist to assist you with the process. Consider switching to a medication currently not in shortage, such as Buprenorphine. Talk to your pain specialist about this option. Consider decreasing your pain medication requirements by managing tolerance thru "Drug Holidays". This may help minimize withdrawals, should you run out of medicine. Control your pain thru   the use of non-pharmacological interventional therapies.   Your prescriber: Prescribers cannot be blamed for shortages. Medication manufacturing and supply issues cannot be fixed by the prescriber.   NOTE: The prescriber is not responsible for supplying the medication, or solving supply issues. Work with your pharmacist to solve it. The patient is responsible for  the decision to take or continue taking the medication and for identifying and securing a legal supply source. By law, supplying the medication is the job and responsibility of the pharmacy. The prescriber is responsible for the evaluation, monitoring, and prescribing of these medications.   Prescribers will NOT: Re-issue prescriptions that have been partially filled. Re-issue prescriptions already sent to a pharmacy.  Re-send prescriptions to a different pharmacy because yours did not have your medication. Ask pharmacist to order more medicine or transfer the prescription to another pharmacy. (Read below.)  New 2023 regulation: "November 21, 2021 Revised Regulation Allows DEA-Registered Pharmacies to Transfer Electronic Prescriptions at a Patient's Request DEA Headquarters Division - Public Information Office Patients now have the ability to request their electronic prescription be transferred to another pharmacy without having to go back to their practitioner to initiate the request. This revised regulation went into effect on Monday, November 17, 2021.     At a patient's request, a DEA-registered retail pharmacy can now transfer an electronic prescription for a controlled substance (schedules II-V) to another DEA-registered retail pharmacy. Prior to this change, patients would have to go through their practitioner to cancel their prescription and have it re-issued to a different pharmacy. The process was taxing and time consuming for both patients and practitioners.    The Drug Enforcement Administration (DEA) published its intent to revise the process for transferring electronic prescriptions on February 09, 2020.  The final rule was published in the federal register on October 16, 2021 and went into effect 30 days later.  Under the final rule, a prescription can only be transferred once between pharmacies, and only if allowed under existing state or other applicable law. The prescription must  remain in its electronic form; may not be altered in any way; and the transfer must be communicated directly between two licensed pharmacists. It's important to note, any authorized refills transfer with the original prescription, which means the entire prescription will be filled at the same pharmacy".  Reference: https://www.dea.gov/stories/2023/2023-11/2021-09-01/revised-regulation-allows-dea-registered-pharmacies-transfer (DEA website announcement)  https://www.govinfo.gov/content/pkg/FR-2021-10-16/pdf/2023-15847.pdf (Federal Register  Department of Justice)   Federal Register / Vol. 88, No. 143 / Thursday, October 16, 2021 / Rules and Regulations DEPARTMENT OF JUSTICE  Drug Enforcement Administration  21 CFR Part 1306  [Docket No. [id number]]  RIN 1117-AB64 Transfer of Electronic Prescriptions for Schedules II-V Controlled Substances Between Pharmacies for Initial Filling  ____________________________________________________________________________________________     ____________________________________________________________________________________________  Transfer of Pain Medication between Pharmacies  Re: 2023 DEA Clarification on existing regulation  Published on DEA Website: November 21, 2021  Title: Revised Regulation Allows DEA-Registered Pharmacies to Transfer Electronic Prescriptions at a Patient's Request DEA Headquarters Division - Public Information Office  "Patients now have the ability to request their electronic prescription be transferred to another pharmacy without having to go back to their practitioner to initiate the request. This revised regulation went into effect on Monday, November 17, 2021.     At a patient's request, a DEA-registered retail pharmacy can now transfer an electronic prescription for a controlled substance (schedules II-V) to another DEA-registered retail pharmacy. Prior to this change, patients would have to go through their practitioner to  cancel their prescription   and have it re-issued to a different pharmacy. The process was taxing and time consuming for both patients and practitioners.    The Drug Enforcement Administration (DEA) published its intent to revise the process for transferring electronic prescriptions on February 09, 2020.  The final rule was published in the federal register on October 16, 2021 and went into effect 30 days later.  Under the final rule, a prescription can only be transferred once between pharmacies, and only if allowed under existing state or other applicable law. The prescription must remain in its electronic form; may not be altered in any way; and the transfer must be communicated directly between two licensed pharmacists. It's important to note, any authorized refills transfer with the original prescription, which means the entire prescription will be filled at the same pharmacy."    REFERENCES: 1. DEA website announcement https://www.dea.gov/stories/2023/2023-11/2021-09-01/revised-regulation-allows-dea-registered-pharmacies-transfer  2. Department of Justice website  https://www.govinfo.gov/content/pkg/FR-2021-10-16/pdf/2023-15847.pdf  3. DEPARTMENT OF JUSTICE Drug Enforcement Administration 21 CFR Part 1306 [Docket No. [id number]] RIN [id number]-AB64 "Transfer of Electronic Prescriptions for Schedules II-V Controlled Substances Between Pharmacies for Initial Filling"  ____________________________________________________________________________________________     _______________________________________________________________________  Medication Rules  Purpose: To inform patients, and their family members, of our medication rules and regulations.  Applies to: All patients receiving prescriptions from our practice (written or electronic).  Pharmacy of record: This is the pharmacy where your electronic prescriptions will be sent. Make sure we have the correct one.  Electronic prescriptions: In  compliance with the [place] Strengthen Opioid Misuse Prevention (STOP) Act of 2017 (Session Law 2017-74/H243), effective March 23, 2018, all controlled substances must be electronically prescribed. Written prescriptions, faxing, or calling prescriptions to a pharmacy will no longer be done.  Prescription refills: These will be provided only during in-person appointments. No medications will be renewed without a "face-to-face" evaluation with your provider. Applies to all prescriptions.  NOTE: The following applies primarily to controlled substances (Opioid* Pain Medications).   Type of encounter (visit): For patients receiving controlled substances, face-to-face visits are required. (Not an option and not up to the patient.)  Patient's responsibilities: Pain Pills: Bring all pain pills to every appointment (except for procedure appointments). Pill Bottles: Bring pills in original pharmacy bottle. Bring bottle, even if empty. Always bring the bottle of the most recent fill.  Medication refills: You are responsible for knowing and keeping track of what medications you are taking and when is it that you will need a refill. The day before your appointment: write a list of all prescriptions that need to be refilled. The day of the appointment: give the list to the admitting nurse. Prescriptions will be written only during appointments. No prescriptions will be written on procedure days. If you forget a medication: it will not be "Called in", "Faxed", or "electronically sent". You will need to get another appointment to get these prescribed. No early refills. Do not call asking to have your prescription filled early. Partial  or short prescriptions: Occasionally your pharmacy may not have enough pills to fill your prescription.  NEVER ACCEPT a partial fill or a prescription that is short of the total amount of pills that you were prescribed.  With controlled substances the law allows 72 hours for  the pharmacy to complete the prescription.  If the prescription is not completed within 72 hours, the pharmacist will require a new prescription to be written. This means that you will be short on your medicine and we WILL NOT send another prescription to complete your original   prescription.  Instead, request the pharmacy to send a carrier to a nearby branch to get enough medication to provide you with your full prescription. Prescription Accuracy: You are responsible for carefully inspecting your prescriptions before leaving our office. Have the discharge nurse carefully go over each prescription with you, before taking them home. Make sure that your name is accurately spelled, that your address is correct. Check the name and dose of your medication to make sure it is accurate. Check the number of pills, and the written instructions to make sure they are clear and accurate. Make sure that you are given enough medication to last until your next medication refill appointment. Taking Medication: Take medication as prescribed. When it comes to controlled substances, taking less pills or less frequently than prescribed is permitted and encouraged. Never take more pills than instructed. Never take the medication more frequently than prescribed.  Inform other Doctors: Always inform, all of your healthcare providers, of all the medications you take. Pain Medication from other Providers: You are not allowed to accept any additional pain medication from any other Doctor or Healthcare provider. There are two exceptions to this rule. (see below) In the event that you require additional pain medication, you are responsible for notifying us, as stated below. Cough Medicine: Often these contain an opioid, such as codeine or hydrocodone. Never accept or take cough medicine containing these opioids if you are already taking an opioid* medication. The combination may cause respiratory failure and death. Medication Agreement:  You are responsible for carefully reading and following our Medication Agreement. This must be signed before receiving any prescriptions from our practice. Safely store a copy of your signed Agreement. Violations to the Agreement will result in no further prescriptions. (Additional copies of our Medication Agreement are available upon request.) Laws, Rules, & Regulations: All patients are expected to follow all Federal and State Laws, Statutes, Rules, & Regulations. Ignorance of the Laws does not constitute a valid excuse.  Illegal drugs and Controlled Substances: The use of illegal substances (including, but not limited to marijuana and its derivatives) and/or the illegal use of any controlled substances is strictly prohibited. Violation of this rule may result in the immediate and permanent discontinuation of any and all prescriptions being written by our practice. The use of any illegal substances is prohibited. Adopted CDC guidelines & recommendations: Target dosing levels will be at or below 60 MME/day. Use of benzodiazepines** is not recommended.  Exceptions: There are only two exceptions to the rule of not receiving pain medications from other Healthcare Providers. Exception #1 (Emergencies): In the event of an emergency (i.e.: accident requiring emergency care), you are allowed to receive additional pain medication. However, you are responsible for: As soon as you are able, call our office [phone], at any time of the day or night, and leave a message stating your name, the date and nature of the emergency, and the name and dose of the medication prescribed. In the event that your call is answered by a member of our staff, make sure to document and save the date, time, and the name of the person that took your information.  Exception #2 (Planned Surgery): In the event that you are scheduled by another doctor or dentist to have any type of surgery or procedure, you are allowed (for a period no  longer than 30 days), to receive additional pain medication, for the acute post-op pain. However, in this case, you are responsible for picking up a copy of   our "Post-op Pain Management for Surgeons" handout, and giving it to your surgeon or dentist. This document is available at our office, and does not require an appointment to obtain it. Simply go to our office during business hours (Monday-Thursday from 8:00 AM to 4:00 PM) (Friday 8:00 AM to 12:00 Noon) or if you have a scheduled appointment with us, prior to your surgery, and ask for it by name. In addition, you are responsible for: calling our office [phone], at any time of the day or night, and leaving a message stating your name, name of your surgeon, type of surgery, and date of procedure or surgery. Failure to comply with your responsibilities may result in termination of therapy involving the controlled substances. Medication Agreement Violation. Following the above rules, including your responsibilities will help you in avoiding a Medication Agreement Violation ("Breaking your Pain Medication Contract").  Consequences:  Not following the above rules may result in permanent discontinuation of medication prescription therapy.  *Opioid medications include: morphine, codeine, oxycodone, oxymorphone, hydrocodone, hydromorphone, meperidine, tramadol, tapentadol, buprenorphine, fentanyl, methadone. **Benzodiazepine medications include: diazepam (Valium), alprazolam (Xanax), clonazepam (Klonopine), lorazepam (Ativan), clorazepate (Tranxene), chlordiazepoxide (Librium), estazolam (Prosom), oxazepam (Serax), temazepam (Restoril), triazolam (Halcion) (Last updated: 01/13/2022) ______________________________________________________________________    ______________________________________________________________________  Medication Recommendations and Reminders  Applies to: All patients receiving prescriptions (written and/or  electronic).  Medication Rules & Regulations: You are responsible for reading, knowing, and following our "Medication Rules" document. These exist for your safety and that of others. They are not flexible and neither are we. Dismissing or ignoring them is an act of "non-compliance" that may result in complete and irreversible termination of such medication therapy. For safety reasons, "non-compliance" will not be tolerated. As with the [place] fundamental legal principle of "ignorance of the law is no defense", we will accept no excuses for not having read and knowing the content of documents provided to you by our practice.  Pharmacy of record:  Definition: This is the pharmacy where your electronic prescriptions will be sent.  We do not endorse any particular pharmacy. It is up to you and your insurance to decide what pharmacy to use.  We do not restrict you in your choice of pharmacy. However, once we write for your prescriptions, we will NOT be re-sending more prescriptions to fix restricted supply problems created by your pharmacy, or your insurance.  The pharmacy listed in the electronic medical record should be the one where you want electronic prescriptions to be sent. If you choose to change pharmacy, simply notify our nursing staff. Changes will be made only during your regular appointments and not over the phone.  Recommendations: Keep all of your pain medications in a safe place, under lock and key, even if you live alone. We will NOT replace lost, stolen, or damaged medication. We do not accept "Police Reports" as proof of medications having been stolen. After you fill your prescription, take 1 week's worth of pills and put them away in a safe place. You should keep a separate, properly labeled bottle for this purpose. The remainder should be kept in the original bottle. Use this as your primary supply, until it runs out. Once it's gone, then you know that you have 1 week's worth of medicine,  and it is time to come in for a prescription refill. If you do this correctly, it is unlikely that you will ever run out of medicine. To make sure that the above recommendation works, it is very important that you make   sure your medication refill appointments are scheduled at least 1 week before you run out of medicine. To do this in an effective manner, make sure that you do not leave the office without scheduling your next medication management appointment. Always ask the nursing staff to show you in your prescription , when your medication will be running out. Then arrange for the receptionist to get you a return appointment, at least 7 days before you run out of medicine. Do not wait until you have 1 or 2 pills left, to come in. This is very poor planning and does not take into consideration that we may need to cancel appointments due to bad weather, sickness, or emergencies affecting our staff. DO NOT ACCEPT A "Partial Fill": If for any reason your pharmacy does not have enough pills/tablets to completely fill or refill your prescription, do not allow for a "partial fill". The law allows the pharmacy to complete that prescription within 72 hours, without requiring a new prescription. If they do not fill the rest of your prescription within those 72 hours, you will need a separate prescription to fill the remaining amount, which we will NOT provide. If the reason for the partial fill is your insurance, you will need to talk to the pharmacist about payment alternatives for the remaining tablets, but again, DO NOT ACCEPT A PARTIAL FILL, unless you can trust your pharmacist to obtain the remainder of the pills within 72 hours.  Prescription refills and/or changes in medication(s):  Prescription refills, and/or changes in dose or medication, will be conducted only during scheduled medication management appointments. (Applies to both, written and electronic prescriptions.) No refills on procedure days. No  medication will be changed or started on procedure days. No changes, adjustments, and/or refills will be conducted on a procedure day. Doing so will interfere with the diagnostic portion of the procedure. No phone refills. No medications will be "called into the pharmacy". No Fax refills. No weekend refills. No Holliday refills. No after hours refills.  Remember:  Business hours are:  Monday to Thursday 8:00 AM to 4:00 PM Provider's Schedule: Kelso Bibby, MD - Appointments are:  Medication management: Monday and Wednesday 8:00 AM to 4:00 PM Procedure day: Tuesday and Thursday 7:30 AM to 4:00 PM Bilal Lateef, MD - Appointments are:  Medication management: Tuesday and Thursday 8:00 AM to 4:00 PM Procedure day: Monday and Wednesday 7:30 AM to 4:00 PM (Last update: 01/13/2022) ______________________________________________________________________    ____________________________________________________________________________________________  Drug Holidays  What is a "Drug Holiday"? Drug Holiday: is the name given to the process of slowly tapering down and temporarily stopping the pain medication for the purpose of decreasing or eliminating tolerance to the drug.  Benefits Improved effectiveness Decreased required effective dose Improved pain control End dependence on high dose therapy Decrease cost of therapy Uncovering "opioid-induced hyperalgesia". (OIH)  What is "opioid hyperalgesia"? It is a paradoxical increase in pain caused by exposure to opioids. Stopping the opioid pain medication, contrary to the expected, it actually decreases or completely eliminates the pain. Ref.: "A comprehensive review of opioid-induced hyperalgesia". Marion Lee, et.al. Pain Physician. 2011 Mar-Apr;14(2):145-61.  What is tolerance? Tolerance: the progressive loss of effectiveness of a pain medicine due to repetitive use. A common problem of opioid pain medications.  How long should a "Drug  Holiday" last? Effectiveness depends on the patient staying off all opioid pain medicines for a minimum of 14 consecutive days. (2 weeks)  How about just taking less of the medicine? Does not   work. Will not accomplish goal of eliminating the excess receptors.  How about switching to a different pain medicine? (AKA. "Opioid rotation") Does not work. Creates the illusion of effectiveness by taking advantage of inaccurate equivalent dose calculations between different opioids. -This "technique" was promoted by studies funded by pharmaceutical companies, such as [employer], creators of "OxyContin".  Can I stop the medicine "cold turkey"? Depends. You should always coordinate with your Pain Specialist to make the transition as smoothly as possible. Avoid stopping the medicine abruptly without consulting. We recommend a "slow taper".  What is a slow taper? Taper: refers to the gradual decrease in dose.   How do I stop/taper the dose? Slowly. Decrease the daily amount of pills that you take by one (1) pill every seven (7) days. This is called a "slow downward taper". Example: if you normally take four (4) pills per day, drop it to three (3) pills per day for seven (7) days, then to two (2) pills per day for seven (7) days, then to one (1) per day for seven (7) days, and then stop the medicine. The 14 day "Drug Holiday" starts on the first day without medicine.   Will I experience withdrawals? Unlikely with a slow taper.  What triggers withdrawals? Withdrawals are triggered by the sudden/abrupt stop of high dose opioids. Withdrawals can be avoided by slowly decreasing the dose over a prolonged period of time.  What are withdrawals? Symptoms associated with sudden/abrupt reduction/stopping of high-dose, long-term use of pain medication. Withdrawal are seldom seen on low dose therapy, or patients rarely taking opioid medication.  Early Withdrawal Symptoms may include: Agitation Anxiety Muscle  aches Increased tearing Insomnia Runny nose Sweating Yawning  Late symptoms may include: Abdominal cramping Diarrhea Dilated pupils Goose bumps Nausea Vomiting  (Last update: 03/01/2022) ____________________________________________________________________________________________    ____________________________________________________________________________________________  WARNING: CBD (cannabidiol) & Delta (Delta-8 tetrahydrocannabinol) products.   Applicable to:  All individuals currently taking or considering taking CBD (cannabidiol) and, more important, all patients taking opioid analgesic controlled substances (pain medication). (Example: oxycodone; oxymorphone; hydrocodone; hydromorphone; morphine; methadone; tramadol; tapentadol; fentanyl; buprenorphine; butorphanol; dextromethorphan; meperidine; codeine; etc.)  Introduction:  Recently there has been a drive towards the use of "natural" products for the treatment of different conditions, including pain anxiety and sleep disorders. Marijuana and hemp are two varieties of the cannabis genus plants. Marijuana and its derivatives are illegal, while hemp and its derivatives are not. Cannabidiol (CBD) and tetrahydrocannabinol (THC), are two natural compounds found in plants of the Cannabis genus. They can both be extracted from hemp or marijuana. Both compounds interact with your body's endocannabinoid system in very different ways. CBD is associated with pain relief (analgesia) while THC is associated with the psychoactive effects ("the high") obtained from the use of marijuana products. There are two main types of THC: Delta-9, which comes from the marijuana plant and it is illegal, and Delta-8, which comes from the hemp plant, and it is legal. (Both, Delta-9-THC and Delta-8-THC are psychoactive and give you "the high".)   Legality:  Marijuana and its derivatives: illegal Hemp and its derivatives: Legal (State dependent) UPDATE:  (05/09/2021) The Drug Enforcement Agency (DEA) issued a letter stating that "delta" cannabinoids, including Delta-8-THCO and Delta-9-THCO, synthetically derived from hemp do not qualify as hemp and will be viewed as Schedule I drugs. (Schedule I drugs, substances, or chemicals are defined as drugs with no currently accepted medical use and a high potential for abuse. Some examples of Schedule I drugs are: heroin,   lysergic acid diethylamide (LSD), marijuana (cannabis), 3,4-methylenedioxymethamphetamine (ecstasy), methaqualone, and peyote.) (https://www.dea.gov)  Legal status of CBD in [place]:  "Conditionally Legal"  Reference: "FDA Regulation of Cannabis and Cannabis-Derived Products, Including Cannabidiol (CBD)" - https://www.fda.gov/news-events/public-health-focus/fda-regulation-cannabis-and-cannabis-derived-products-including-cannabidiol-cbd  Warning:  CBD is not FDA approved and has not undergo the same manufacturing controls as prescription drugs.  This means that the purity and safety of available CBD may be questionable. Most of the time, despite manufacturer's claims, it is contaminated with THC (delta-9-tetrahydrocannabinol - the chemical in marijuana responsible for the "HIGH").  When this is the case, the THC contaminant will trigger a positive urine drug screen (UDS) test for Marijuana (carboxy-THC).   The FDA recently put out a warning about 5 things that everyone should be aware of regarding Delta-8 THC: Delta-8 THC products have not been evaluated or approved by the FDA for safe use and may be marketed in ways that put the public health at risk. The FDA has received adverse event reports involving delta-8 THC-containing products. Delta-8 THC has psychoactive and intoxicating effects. Delta-8 THC manufacturing often involve use of potentially harmful chemicals to create the concentrations of delta-8 THC claimed in the marketplace. The final delta-8 THC product may have potentially harmful  by-products (contaminants) due to the chemicals used in the process. Manufacturing of delta-8 THC products may occur in uncontrolled or unsanitary settings, which may lead to the presence of unsafe contaminants or other potentially harmful substances. Delta-8 THC products should be kept out of the reach of children and pets.  NOTE: Because a positive UDS for any illicit substance is a violation of our medication agreement, your opioid analgesics (pain medicine) may be permanently discontinued.  MORE ABOUT CBD  General Information: CBD was discovered in 1940 and it is a derivative of the cannabis sativa genus plants (Marijuana and Hemp). It is one of the 113 identified substances found in Marijuana. It accounts for up to 40% of the plant's extract. As of 2018, preliminary clinical studies on CBD included research for the treatment of anxiety, movement disorders, and pain. CBD is available and consumed in multiple forms, including inhalation of smoke or vapor, as an aerosol spray, and by mouth. It may be supplied as an oil containing CBD, capsules, dried cannabis, or as a liquid solution. CBD is thought not to be as psychoactive as THC (delta-9-tetrahydrocannabinol - the chemical in marijuana responsible for the "HIGH"). Studies suggest that CBD may interact with different biological target receptors in the body, including cannabinoid and other neurotransmitter receptors. As of 2018 the mechanism of action for its biological effects has not been determined.  Side-effects  Adverse reactions: Dry mouth, diarrhea, decreased appetite, fatigue, drowsiness, malaise, weakness, sleep disturbances, and others.  Drug interactions:  CBD may interact with medications such as blood-thinners. CBD causes drowsiness on its own and it will increase drowsiness caused by other medications, including antihistamines (such as Benadryl), benzodiazepines (Xanax, Ativan, Valium), antipsychotics, antidepressants, opioids, alcohol  and supplements such as kava, melatonin and St. John's Wort.  Other drug interactions: Brivaracetam (Briviact); Caffeine; Carbamazepine (Tegretol); Citalopram (Celexa); Clobazam (Onfi); Eslicarbazepine (Aptiom); Everolimus (Zostress); Lithium; Methadone (Dolophine); Rufinamide (Banzel); Sedative medications (CNS depressants); Sirolimus (Rapamune); Stiripentol (Diacomit); Tacrolimus (Prograf); Tamoxifen ; Soltamox); Topiramate (Topamax); Valproate; Warfarin (Coumadin); Zonisamide. (Last update: 03/02/2022) ____________________________________________________________________________________________   ____________________________________________________________________________________________  Naloxone Nasal Spray  Why am I receiving this medication? [place] STOP ACT requires that all patients taking high dose opioids or at risk of opioids respiratory depression, be prescribed an opioid reversal agent, such as   Naloxone (AKA: Narcan).  What is this medication? NALOXONE (nal OX one) treats opioid overdose, which causes slow or shallow breathing, severe drowsiness, or trouble staying awake. Call emergency services after using this medication. You may need additional treatment. Naloxone works by reversing the effects of opioids. It belongs to a group of medications called opioid blockers.  COMMON BRAND NAME(S): Kloxxado, Narcan  What should I tell my care team before I take this medication? They need to know if you have any of these conditions: Heart disease Substance use disorder An unusual or allergic reaction to naloxone, other medications, foods, dyes, or preservatives Pregnant or trying to get pregnant Breast-feeding  When to use this medication? This medication is to be used for the treatment of respiratory depression (less than 8 breaths per minute) secondary to opioid overdose.   How to use this medication? This medication is for use in the nose. Lay the person on their back.  Support their neck with your hand and allow the head to tilt back before giving the medication. The nasal spray should be given into 1 nostril. After giving the medication, move the person onto their side. Do not remove or test the nasal spray until ready to use. Get emergency medical help right away after giving the first dose of this medication, even if the person wakes up. You should be familiar with how to recognize the signs and symptoms of a narcotic overdose. If more doses are needed, give the additional dose in the other nostril. Talk to your care team about the use of this medication in children. While this medication may be prescribed for children as young as newborns for selected conditions, precautions do apply.  Naloxone Overdosage: If you think you have taken too much of this medicine contact a poison control center or emergency room at once.  NOTE: This medicine is only for you. Do not share this medicine with others.  What if I miss a dose? This does not apply.  What may interact with this medication? This is only used during an emergency. No interactions are expected during emergency use. This list may not describe all possible interactions. Give your health care provider a list of all the medicines, herbs, non-prescription drugs, or dietary supplements you use. Also tell them if you smoke, drink alcohol, or use illegal drugs. Some items may interact with your medicine.  What should I watch for while using this medication? Keep this medication ready for use in the case of an opioid overdose. Make sure that you have the phone number of your care team and local hospital ready. You may need to have additional doses of this medication. Each nasal spray contains a single dose. Some emergencies may require additional doses. After use, bring the treated person to the nearest hospital or call 911. Make sure the treating care team knows that the person has received a dose of this medication.  You will receive additional instructions on what to do during and after use of this medication before an emergency occurs.  What side effects may I notice from receiving this medication? Side effects that you should report to your care team as soon as possible: Allergic reactions--skin rash, itching, hives, swelling of the face, lips, tongue, or throat Side effects that usually do not require medical attention (report these to your care team if they continue or are bothersome): Constipation Dryness or irritation inside the nose Headache Increase in blood pressure Muscle spasms Stuffy nose Toothache This list may not   describe all possible side effects. Call your doctor for medical advice about side effects. You may report side effects to FDA at 1-800-FDA-1088.  Where should I keep my medication? Because this is an emergency medication, you should keep it with you at all times.  Keep out of the reach of children and pets. Store between 20 and 25 degrees C (68 and 77 degrees F). Do not freeze. Throw away any unused medication after the expiration date. Keep in original box until ready to use.  NOTE: This sheet is a summary. It may not cover all possible information. If you have questions about this medicine, talk to your doctor, pharmacist, or health care provider.   2023 Elsevier/Gold Standard (2020-11-15 00:00:00)  ____________________________________________________________________________________________   
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
## 2022-06-10 ENCOUNTER — Encounter: Payer: PPO | Attending: Pulmonary Disease

## 2022-06-10 ENCOUNTER — Other Ambulatory Visit: Payer: Self-pay

## 2022-06-10 ENCOUNTER — Encounter: Payer: Self-pay | Admitting: Pain Medicine

## 2022-06-10 ENCOUNTER — Ambulatory Visit: Payer: PPO | Attending: Pain Medicine | Admitting: Pain Medicine

## 2022-06-10 DIAGNOSIS — M25559 Pain in unspecified hip: Secondary | ICD-10-CM | POA: Diagnosis not present

## 2022-06-10 DIAGNOSIS — M5414 Radiculopathy, thoracic region: Secondary | ICD-10-CM

## 2022-06-10 DIAGNOSIS — M961 Postlaminectomy syndrome, not elsewhere classified: Secondary | ICD-10-CM

## 2022-06-10 DIAGNOSIS — M25552 Pain in left hip: Secondary | ICD-10-CM | POA: Diagnosis not present

## 2022-06-10 DIAGNOSIS — G894 Chronic pain syndrome: Secondary | ICD-10-CM

## 2022-06-10 DIAGNOSIS — G8929 Other chronic pain: Secondary | ICD-10-CM | POA: Diagnosis not present

## 2022-06-10 DIAGNOSIS — M79601 Pain in right arm: Secondary | ICD-10-CM

## 2022-06-10 DIAGNOSIS — M545 Low back pain, unspecified: Secondary | ICD-10-CM | POA: Diagnosis not present

## 2022-06-10 DIAGNOSIS — M79602 Pain in left arm: Secondary | ICD-10-CM | POA: Insufficient documentation

## 2022-06-10 DIAGNOSIS — Z79899 Other long term (current) drug therapy: Secondary | ICD-10-CM | POA: Diagnosis not present

## 2022-06-10 DIAGNOSIS — M25551 Pain in right hip: Secondary | ICD-10-CM

## 2022-06-10 DIAGNOSIS — Z5189 Encounter for other specified aftercare: Secondary | ICD-10-CM | POA: Insufficient documentation

## 2022-06-10 DIAGNOSIS — M542 Cervicalgia: Secondary | ICD-10-CM | POA: Diagnosis not present

## 2022-06-10 DIAGNOSIS — J42 Unspecified chronic bronchitis: Secondary | ICD-10-CM | POA: Insufficient documentation

## 2022-06-10 DIAGNOSIS — Z79891 Long term (current) use of opiate analgesic: Secondary | ICD-10-CM | POA: Diagnosis not present

## 2022-06-10 MED ORDER — MORPHINE SULFATE ER 15 MG PO TBCR: 15.0000 mg | EXTENDED_RELEASE_TABLET | Freq: Two times a day (BID) | ORAL | 0 refills | Status: DC

## 2022-06-10 NOTE — Progress Notes (Signed)
Virtual Visit completed. Patient informed on EP and RD appointment and 6 Minute walk test. Patient also informed of patient health questionnaires on My Chart. Patient Verbalizes understanding. Visit diagnosis can be found in [facility] 06/01/2022.

## 2022-06-10 NOTE — Progress Notes (Signed)
Nursing Pain Medication Assessment:  Safety precautions to be maintained throughout the outpatient stay will include: orient to surroundings, keep bed in low position, maintain call bell within reach at all times, provide assistance with transfer out of bed and ambulation.  Medication Inspection Compliance: Pill count conducted under aseptic conditions, in front of the patient. Neither the pills nor the bottle was removed from the patient's sight at any time. Once count was completed pills were immediately returned to the patient in their original bottle.  Medication: Morphine IR Pill/Patch Count:  11 of 60 pills remain Pill/Patch Appearance: Markings consistent with prescribed medication Bottle Appearance: Standard pharmacy container. Clearly labeled. Filled Date: 02 / 24 / 2024 Last Medication intake:  Yesterday

## 2022-06-11 ENCOUNTER — Other Ambulatory Visit: Payer: Self-pay | Admitting: Thoracic Surgery (Cardiothoracic Vascular Surgery)

## 2022-06-11 DIAGNOSIS — C3490 Malignant neoplasm of unspecified part of unspecified bronchus or lung: Secondary | ICD-10-CM

## 2022-06-12 ENCOUNTER — Ambulatory Visit
Admission: RE | Admit: 2022-06-12 | Discharge: 2022-06-12 | Disposition: A | Discharge status: Home / Self Care | Payer: PPO | Source: Ambulatory Visit | Attending: Thoracic Surgery (Cardiothoracic Vascular Surgery) | Admitting: Thoracic Surgery (Cardiothoracic Vascular Surgery)

## 2022-06-12 ENCOUNTER — Encounter: Payer: Self-pay | Admitting: Thoracic Surgery (Cardiothoracic Vascular Surgery)

## 2022-06-12 ENCOUNTER — Ambulatory Visit (INDEPENDENT_AMBULATORY_CARE_PROVIDER_SITE_OTHER): Payer: Self-pay | Admitting: Thoracic Surgery (Cardiothoracic Vascular Surgery)

## 2022-06-12 VITALS — BP 146/81 | HR 67 | Resp 20 | Ht 70.0 in | Wt 216.0 lb

## 2022-06-12 DIAGNOSIS — C3490 Malignant neoplasm of unspecified part of unspecified bronchus or lung: Secondary | ICD-10-CM

## 2022-06-12 DIAGNOSIS — J449 Chronic obstructive pulmonary disease, unspecified: Secondary | ICD-10-CM | POA: Diagnosis not present

## 2022-06-12 DIAGNOSIS — Z902 Acquired absence of lung [part of]: Secondary | ICD-10-CM

## 2022-06-12 NOTE — Progress Notes (Signed)
      [address]       [place] [address]             [phone]        Howard Maxwell  Medical Record [id number] Date of Birth: 02-28-1946  Referring: Garner Nash, DO Primary Care: Tracie Harrier, MD Primary Cardiologist:Muhammad Fletcher Anon, MD  Reason for visit:   follow-up  History of Present Illness:     77 yo male presents for his 1 month follow-up.  Overall, he is doing well.  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 He will continue to follow-up with medical oncology.  He will follow-up with [place] as needed.   Lajuana Matte 06/12/2022 10:12 AM

## 2022-06-15 ENCOUNTER — Ambulatory Visit
Admission: RE | Admit: 2022-06-15 | Discharge: 2022-06-15 | Disposition: A | Discharge status: Home / Self Care | Payer: PPO | Attending: Gastroenterology | Admitting: Gastroenterology

## 2022-06-15 ENCOUNTER — Ambulatory Visit: Payer: PPO | Admitting: Certified Registered Nurse Anesthetist

## 2022-06-15 ENCOUNTER — Other Ambulatory Visit: Payer: Self-pay

## 2022-06-15 ENCOUNTER — Encounter
Admission: RE | Disposition: A | Discharge status: Home / Self Care | Payer: Self-pay | Source: Home / Self Care | Attending: Gastroenterology

## 2022-06-15 ENCOUNTER — Encounter: Payer: Self-pay | Admitting: *Deleted

## 2022-06-15 DIAGNOSIS — Z86010 Personal history of colonic polyps: Secondary | ICD-10-CM | POA: Diagnosis not present

## 2022-06-15 DIAGNOSIS — R1310 Dysphagia, unspecified: Secondary | ICD-10-CM | POA: Insufficient documentation

## 2022-06-15 DIAGNOSIS — Z5309 Procedure and treatment not carried out because of other contraindication: Secondary | ICD-10-CM | POA: Insufficient documentation

## 2022-06-15 HISTORY — PX: COLONOSCOPY: SHX5424

## 2022-06-15 HISTORY — PX: ESOPHAGOGASTRODUODENOSCOPY: SHX5428

## 2022-06-15 LAB — GLUCOSE, CAPILLARY: Glucose-Capillary: 122 mg/dL — ABNORMAL HIGH (ref 70–99)

## 2022-06-15 SURGERY — COLONOSCOPY
Anesthesia: General

## 2022-06-15 MED ORDER — SODIUM CHLORIDE 0.9 % IV SOLN: INTRAVENOUS | Status: DC

## 2022-06-15 NOTE — Progress Notes (Signed)
Patient very congested and cancelled per anesthesia and also not clean poor prep

## 2022-06-16 ENCOUNTER — Encounter: Payer: Self-pay | Admitting: Gastroenterology

## 2022-06-17 ENCOUNTER — Encounter: Payer: PPO | Admitting: *Deleted

## 2022-06-17 VITALS — Ht 69.25 in | Wt 218.7 lb

## 2022-06-17 DIAGNOSIS — J42 Unspecified chronic bronchitis: Secondary | ICD-10-CM | POA: Diagnosis not present

## 2022-06-17 DIAGNOSIS — Z5189 Encounter for other specified aftercare: Secondary | ICD-10-CM | POA: Diagnosis not present

## 2022-06-17 NOTE — Progress Notes (Signed)
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 physician to assist with identification of specific Stressors or current issues interfering with healing process. Setting desired goal for each stressor or current issue identified.;Long Term Goal: Stressors or current issues are controlled or eliminated.;Short Term goal: Identification and review with participant of any Quality of Life  or Depression concerns found by scoring the questionnaire.;Long Term goal: The participant improves quality of Life and PHQ9 Scores as seen by post scores and/or verbalization of changes             Quality of Life Scores:  Scores of 19 and below usually indicate a poorer quality of life in these areas.  A difference of  2-3 points is a clinically meaningful difference.  A difference of 2-3 points in the total score of the Quality of Life Index has been associated with significant improvement in overall  quality of life, self-image, physical symptoms, and general health in studies assessing change in quality of life.  PHQ-9: Review Flowsheet  More data exists      06/17/2022 06/10/2022 07/01/2021 02/19/2021 11/20/2020  Depression screen PHQ 2/9  Decreased Interest 0 0 0 0 0  Down, Depressed, Hopeless 0 0 0 0 0  PHQ - 2 Score 0 0 0 0 0  Altered sleeping 1 - - - -  Tired, decreased energy 1 - - - -  Change in appetite 0 - - - -  Feeling bad or failure about yourself  0 - - - -  Trouble concentrating 0 - - - -  Moving slowly or fidgety/restless 0 - - - -  Suicidal thoughts 0 - - - -  PHQ-9 Score 2 - - - -  Difficult doing work/chores Not difficult at all - - - -   Interpretation of Total Score  Total Score Depression Severity:  1-4 = Minimal depression, 5-9 = Mild depression, 10-14 = Moderate depression, 15-19 = Moderately severe depression, 20-27 = Severe depression   Psychosocial Evaluation and Intervention:  Psychosocial Evaluation - 06/10/22 1353       Psychosocial Evaluation & Interventions   Interventions Relaxation education;Encouraged to exercise with the program and follow exercise prescription;Stress management education    Comments Tayo states that he does not sleep well but since he has been on Trazedone he has been doing well. He has alot of back pain and wants to do more exercise to help with his shortness of breath.Esgardo has his wife, kids and church group to look to for support.    Expected Outcomes Short: Start HeartTrack to help with mood. Long: Maintain a healthy mental state    Continue Psychosocial Services  Follow up required by staff             Psychosocial Re-Evaluation:   Psychosocial Discharge (Final Psychosocial Re-Evaluation):   Education: Education Goals: Education classes will be provided on a weekly basis, covering required topics. Participant will state understanding/return demonstration of topics presented.  Learning  Barriers/Preferences:  Learning Barriers/Preferences - 06/10/22 1347       Learning Barriers/Preferences   Learning Barriers Sight    Learning Preferences None             General Pulmonary Education Topics:  Infection Prevention: - Provides verbal and written material to individual with discussion of infection control including proper hand washing and proper equipment cleaning during exercise session. Flowsheet Row Pulmonary Rehab from 06/17/2022 in [facility] Cardiac and Pulmonary Rehab  Date 06/17/22  Educator [facility]  Instruction Review Code 1- Verbalizes Understanding       Falls Prevention: - Provides verbal and written material to individual with discussion of falls prevention and safety. Flowsheet Row Pulmonary Rehab from 06/17/2022 in [facility] Cardiac and Pulmonary Rehab  Date 06/17/22  Educator [facility]  Instruction Review Code 1- [employer]  Chronic Lung Disease Review: - Group verbal instruction with posters, models, PowerPoint presentations and videos,  to review new updates, new respiratory medications, new advancements in procedures and treatments. Providing information on websites and "800" numbers for continued self-education. Includes information about supplement oxygen, available portable oxygen systems, continuous and intermittent flow rates, oxygen safety, concentrators, and Medicare reimbursement for oxygen. Explanation of Pulmonary Drugs, including class, frequency, complications, importance of spacers, rinsing mouth after steroid MDI's, and proper cleaning methods for nebulizers. Review of basic lung anatomy and physiology related to function, structure, and complications of lung disease. Review of risk factors. Discussion about methods for diagnosing sleep apnea and types of masks and machines for OSA. Includes a review of the use of types of environmental controls: home humidity, furnaces, filters, dust mite/pet prevention, HEPA vacuums. Discussion about  weather changes, air quality and the benefits of nasal washing. Instruction on Warning signs, infection symptoms, calling MD promptly, preventive modes, and value of vaccinations. Review of effective airway clearance, coughing and/or vibration techniques. Emphasizing that all should Create an Action Plan. Written material given at graduation. Flowsheet Row Pulmonary Rehab from 06/17/2022 in [facility] Cardiac and Pulmonary Rehab  Education need identified 06/17/22       AED/CPR: - Group verbal and written instruction with the use of models to demonstrate the basic use of the AED with the basic ABC's of resuscitation.    Anatomy and Cardiac Procedures: - Group verbal and visual presentation and models provide information about basic cardiac anatomy and function. Reviews the testing methods done to diagnose heart disease and the outcomes of the test results. Describes the treatment choices: Medical Management, Angioplasty, or Coronary Bypass Surgery for treating various heart conditions including Myocardial Infarction, Angina, Valve Disease, and Cardiac Arrhythmias.  Written material given at graduation.   Medication Safety: - Group verbal and visual instruction to review commonly prescribed medications for heart and lung disease. Reviews the medication, class of the drug, and side effects. Includes the steps to properly store meds and maintain the prescription regimen.  Written material given at graduation.   Other: -Provides group and verbal instruction on various topics (see comments)   Knowledge Questionnaire Score:  Knowledge Questionnaire Score - 06/17/22 1347       Knowledge Questionnaire Score   Pre Score 17/18              Core Components/Risk Factors/Patient Goals at Admission:  Personal Goals and Risk Factors at Admission - 06/17/22 1348       Core Components/Risk Factors/Patient Goals on Admission    Weight Management Yes;Weight Loss    Intervention Weight Management:  Develop a combined nutrition and exercise program designed to reach desired caloric intake, while maintaining appropriate intake of nutrient and fiber, sodium and fats, and appropriate energy expenditure required for the weight goal.;Weight Management: Provide education and appropriate resources to help participant work on and attain dietary goals.;Weight Management/Obesity: Establish reasonable short term and long term weight goals.    Admit Weight 218 lb 11.2 oz (99.2 kg)    Goal Weight: Short Term 210 lb (95.3 kg)    Goal Weight: Long Term 190 lb (86.2 kg)    Expected Outcomes Short Term: Continue to assess and modify interventions until short term weight is achieved;Long Term: Adherence to nutrition and physical activity/exercise program aimed toward attainment of established weight goal;Weight Loss: Understanding of general recommendations for a balanced deficit meal plan, which promotes 1-2 lb weight loss per week and includes a negative energy balance  of [phone] kcal/d;Understanding recommendations for meals to include 15-35% energy as protein, 25-35% energy from fat, 35-60% energy from carbohydrates, less than 200mg  of dietary cholesterol, 20-35 gm of total fiber daily;Understanding of distribution of calorie intake throughout the day with the consumption of 4-5 meals/snacks    Improve shortness of breath with ADL's Yes    Intervention Provide education, individualized exercise plan and daily activity instruction to help decrease symptoms of SOB with activities of daily living.    Expected Outcomes Short Term: Improve cardiorespiratory fitness to achieve a reduction of symptoms when performing ADLs;Long Term: Be able to perform more ADLs without symptoms or delay the onset of symptoms    Diabetes Yes    Intervention Provide education about signs/symptoms and action to take for hypo/hyperglycemia.;Provide education about proper nutrition, including hydration, and aerobic/resistive exercise  prescription along with prescribed medications to achieve blood glucose in normal ranges: Fasting glucose 65-99 mg/dL    Expected Outcomes Long Term: Attainment of HbA1C < 7%.;Short Term: Participant verbalizes understanding of the signs/symptoms and immediate care of hyper/hypoglycemia, proper foot care and importance of medication, aerobic/resistive exercise and nutrition plan for blood glucose control.    Hypertension Yes    Intervention Provide education on lifestyle modifcations including regular physical activity/exercise, weight management, moderate sodium restriction and increased consumption of fresh fruit, vegetables, and low fat dairy, alcohol moderation, and smoking cessation.;Monitor prescription use compliance.    Expected Outcomes Short Term: Continued assessment and intervention until BP is < 140/72mm HG in hypertensive participants. < 130/14mm HG in hypertensive participants with diabetes, heart failure or chronic kidney disease.;Long Term: Maintenance of blood pressure at goal levels.    Lipids Yes    Intervention Provide education and support for participant on nutrition & aerobic/resistive exercise along with prescribed medications to achieve LDL 70mg , HDL >40mg .    Expected Outcomes Short Term: Participant states understanding of desired cholesterol values and is compliant with medications prescribed. Participant is following exercise prescription and nutrition guidelines.;Long Term: Cholesterol controlled with medications as prescribed, with individualized exercise RX and with personalized nutrition plan. Value goals: LDL < 70mg , HDL > 40 mg.             Education:Diabetes - Individual verbal and written instruction to review signs/symptoms of diabetes, desired ranges of glucose level fasting, after meals and with exercise. Acknowledge that pre and post exercise glucose checks will be done for 3 sessions at entry of program. Flowsheet Row Pulmonary Rehab from 06/17/2022 in [facility]  Cardiac and Pulmonary Rehab  Date 06/17/22  Educator [facility]  Instruction Review Code 1- Verbalizes Understanding       Know Your Numbers and Heart Failure: - Group verbal and visual instruction to discuss disease risk factors for cardiac and pulmonary disease and treatment options.  Reviews associated critical values for Overweight/Obesity, Hypertension, Cholesterol, and Diabetes.  Discusses basics of heart failure: signs/symptoms and treatments.  Introduces Heart Failure Zone chart for action plan for heart failure.  Written material given at graduation.   Core Components/Risk Factors/Patient Goals Review:    Core Components/Risk Factors/Patient Goals at Discharge (Final Review):    ITP Comments:  ITP Comments     Row Name 06/10/22 1345 06/17/22 1326         ITP Comments Virtual Visit completed. Patient informed on EP and RD appointment and 6 Minute walk test. Patient also informed of patient health questionnaires on My Chart. Patient Verbalizes understanding. Visit diagnosis can be found in [facility] 06/01/2022. Completed 6MWT and gym  orientation. Initial ITP created and sent for review to Dr. Zetta Bills, Medical Director.               Comments: initial ITP

## 2022-06-17 NOTE — Patient Instructions (Signed)
Patient Instructions  Patient Details  Name: Howard Maxwell MRN: [id number] Date of Birth: Jul 28, 1945 Referring Provider:  Rigoberto Noel, MD  Below are your personal goals for exercise, nutrition, and risk factors. Our goal is to help you stay on track towards obtaining and maintaining these goals. We will be discussing your progress on these goals with you throughout the program.  Initial Exercise Prescription:  Initial Exercise Prescription - 06/17/22 1300       Date of Initial Exercise RX and Referring Provider   Date 06/17/22    Referring Provider R. Alva      Oxygen   Maintain Oxygen Saturation 88% or higher      NuStep   Level 1    SPM 80    Minutes 15    METs 1.86      REL-XR   Level 1    Speed 50    Minutes 15    METs 1.86      Biostep-RELP   Level 1    SPM 50    Minutes 15    METs 1.86      Track   Laps 20    Minutes 15    METs 1.82      Prescription Details   Frequency (times per week) 2    Duration Progress to 30 minutes of continuous aerobic without signs/symptoms of physical distress      Intensity   THRR 40-80% of Max Heartrate 112-133    Ratings of Perceived Exertion 11-13    Perceived Dyspnea 0-4      Progression   Progression Continue to progress workloads to maintain intensity without signs/symptoms of physical distress.      Resistance Training   Training Prescription Yes    Weight 3    Reps 10-15             Exercise Goals: Frequency: Be able to perform aerobic exercise two to three times per week in program working toward 2-5 days per week of home exercise.  Intensity: Work with a perceived exertion of 11 (fairly light) - 15 (hard) while following your exercise prescription.  We will make changes to your prescription with you as you progress through the program.   Duration: Be able to do 30 to 45 minutes of continuous aerobic exercise in addition to a 5 minute warm-up and a 5 minute cool-down routine.   Nutrition  Goals: Your personal nutrition goals will be established when you do your nutrition analysis with the dietician.  The following are general nutrition guidelines to follow: Cholesterol < 200mg /day Sodium < 1500mg /day Fiber: Men over 50 yrs - 30 grams per day  Personal Goals:  Personal Goals and Risk Factors at Admission - 06/17/22 1348       Core Components/Risk Factors/Patient Goals on Admission    Weight Management Yes;Weight Loss    Intervention Weight Management: Develop a combined nutrition and exercise program designed to reach desired caloric intake, while maintaining appropriate intake of nutrient and fiber, sodium and fats, and appropriate energy expenditure required for the weight goal.;Weight Management: Provide education and appropriate resources to help participant work on and attain dietary goals.;Weight Management/Obesity: Establish reasonable short term and long term weight goals.    Admit Weight 218 lb 11.2 oz (99.2 kg)    Goal Weight: Short Term 210 lb (95.3 kg)    Goal Weight: Long Term 190 lb (86.2 kg)    Expected Outcomes Short Term: Continue to assess and modify interventions  until short term weight is achieved;Long Term: Adherence to nutrition and physical activity/exercise program aimed toward attainment of established weight goal;Weight Loss: Understanding of general recommendations for a balanced deficit meal plan, which promotes 1-2 lb weight loss per week and includes a negative energy balance of [phone] kcal/d;Understanding recommendations for meals to include 15-35% energy as protein, 25-35% energy from fat, 35-60% energy from carbohydrates, less than 200mg  of dietary cholesterol, 20-35 gm of total fiber daily;Understanding of distribution of calorie intake throughout the day with the consumption of 4-5 meals/snacks    Improve shortness of breath with ADL's Yes    Intervention Provide education, individualized exercise plan and daily activity instruction to help  decrease symptoms of SOB with activities of daily living.    Expected Outcomes Short Term: Improve cardiorespiratory fitness to achieve a reduction of symptoms when performing ADLs;Long Term: Be able to perform more ADLs without symptoms or delay the onset of symptoms    Diabetes Yes    Intervention Provide education about signs/symptoms and action to take for hypo/hyperglycemia.;Provide education about proper nutrition, including hydration, and aerobic/resistive exercise prescription along with prescribed medications to achieve blood glucose in normal ranges: Fasting glucose 65-99 mg/dL    Expected Outcomes Long Term: Attainment of HbA1C < 7%.;Short Term: Participant verbalizes understanding of the signs/symptoms and immediate care of hyper/hypoglycemia, proper foot care and importance of medication, aerobic/resistive exercise and nutrition plan for blood glucose control.    Hypertension Yes    Intervention Provide education on lifestyle modifcations including regular physical activity/exercise, weight management, moderate sodium restriction and increased consumption of fresh fruit, vegetables, and low fat dairy, alcohol moderation, and smoking cessation.;Monitor prescription use compliance.    Expected Outcomes Short Term: Continued assessment and intervention until BP is < 140/30mm HG in hypertensive participants. < 130/27mm HG in hypertensive participants with diabetes, heart failure or chronic kidney disease.;Long Term: Maintenance of blood pressure at goal levels.    Lipids Yes    Intervention Provide education and support for participant on nutrition & aerobic/resistive exercise along with prescribed medications to achieve LDL 70mg , HDL >40mg .    Expected Outcomes Short Term: Participant states understanding of desired cholesterol values and is compliant with medications prescribed. Participant is following exercise prescription and nutrition guidelines.;Long Term: Cholesterol controlled with  medications as prescribed, with individualized exercise RX and with personalized nutrition plan. Value goals: LDL < 70mg , HDL > 40 mg.             Tobacco Use Initial Evaluation: Social History   Tobacco Use  Smoking Status Former   Packs/day: 1.50   Years: 47.00   Additional pack years: 0.00   Total pack years: 70.50   Types: Cigarettes   Quit date: 02/20/2014   Years since quitting: 8.3  Smokeless Tobacco Never  Tobacco Comments   1-2 packs per day    Exercise Goals and Review:  Exercise Goals     Row Name 06/17/22 1344             Exercise Goals   Increase Physical Activity Yes       Intervention Provide advice, education, support and counseling about physical activity/exercise needs.;Develop an individualized exercise prescription for aerobic and resistive training based on initial evaluation findings, risk stratification, comorbidities and participant's personal goals.       Expected Outcomes Short Term: Attend rehab on a regular basis to increase amount of physical activity.;Long Term: Add in home exercise to make exercise part of routine and to increase amount  of physical activity.;Long Term: Exercising regularly at least 3-5 days a week.       Increase Strength and Stamina Yes       Intervention Provide advice, education, support and counseling about physical activity/exercise needs.;Develop an individualized exercise prescription for aerobic and resistive training based on initial evaluation findings, risk stratification, comorbidities and participant's personal goals.       Expected Outcomes Short Term: Increase workloads from initial exercise prescription for resistance, speed, and METs.;Short Term: Perform resistance training exercises routinely during rehab and add in resistance training at home;Long Term: Improve cardiorespiratory fitness, muscular endurance and strength as measured by increased METs and functional capacity (6MWT)       Able to understand and use  rate of perceived exertion (RPE) scale Yes       Intervention Provide education and explanation on how to use RPE scale       Expected Outcomes Short Term: Able to use RPE daily in rehab to express subjective intensity level;Long Term:  Able to use RPE to guide intensity level when exercising independently       Able to understand and use Dyspnea scale Yes       Intervention Provide education and explanation on how to use Dyspnea scale       Expected Outcomes Short Term: Able to use Dyspnea scale daily in rehab to express subjective sense of shortness of breath during exertion;Long Term: Able to use Dyspnea scale to guide intensity level when exercising independently       Knowledge and understanding of Target Heart Rate Range (THRR) Yes       Intervention Provide education and explanation of THRR including how the numbers were predicted and where they are located for reference       Expected Outcomes Short Term: Able to state/look up THRR;Long Term: Able to use THRR to govern intensity when exercising independently;Short Term: Able to use daily as guideline for intensity in rehab       Able to check pulse independently Yes       Intervention Provide education and demonstration on how to check pulse in carotid and radial arteries.;Review the importance of being able to check your own pulse for safety during independent exercise       Expected Outcomes Short Term: Able to explain why pulse checking is important during independent exercise;Long Term: Able to check pulse independently and accurately       Understanding of Exercise Prescription Yes       Intervention Provide education, explanation, and written materials on patient's individual exercise prescription       Expected Outcomes Short Term: Able to explain program exercise prescription;Long Term: Able to explain home exercise prescription to exercise independently                Copy of goals given to participant.

## 2022-06-22 ENCOUNTER — Encounter: Payer: PPO | Attending: Pulmonary Disease

## 2022-06-22 DIAGNOSIS — J42 Unspecified chronic bronchitis: Secondary | ICD-10-CM | POA: Insufficient documentation

## 2022-06-22 DIAGNOSIS — J439 Emphysema, unspecified: Secondary | ICD-10-CM | POA: Insufficient documentation

## 2022-06-22 DIAGNOSIS — Z87891 Personal history of nicotine dependence: Secondary | ICD-10-CM | POA: Insufficient documentation

## 2022-06-22 DIAGNOSIS — J4489 Other specified chronic obstructive pulmonary disease: Secondary | ICD-10-CM | POA: Insufficient documentation

## 2022-06-22 DIAGNOSIS — J9691 Respiratory failure, unspecified with hypoxia: Secondary | ICD-10-CM | POA: Insufficient documentation

## 2022-06-22 NOTE — Progress Notes (Signed)
Completed initial RD consultation ?

## 2022-06-23 ENCOUNTER — Encounter: Payer: PPO | Admitting: *Deleted

## 2022-06-23 DIAGNOSIS — J9691 Respiratory failure, unspecified with hypoxia: Secondary | ICD-10-CM | POA: Diagnosis not present

## 2022-06-23 DIAGNOSIS — Z87891 Personal history of nicotine dependence: Secondary | ICD-10-CM | POA: Diagnosis not present

## 2022-06-23 DIAGNOSIS — J42 Unspecified chronic bronchitis: Secondary | ICD-10-CM

## 2022-06-23 DIAGNOSIS — J4489 Other specified chronic obstructive pulmonary disease: Secondary | ICD-10-CM | POA: Diagnosis not present

## 2022-06-23 DIAGNOSIS — J439 Emphysema, unspecified: Secondary | ICD-10-CM | POA: Diagnosis not present

## 2022-06-23 LAB — GLUCOSE, CAPILLARY
Glucose-Capillary: 186 mg/dL — ABNORMAL HIGH (ref 70–99)
Glucose-Capillary: 145 mg/dL — ABNORMAL HIGH (ref 70–99)

## 2022-06-23 NOTE — Progress Notes (Signed)
Daily Session Note  Patient Details  Name: Howard Maxwell MRN: [id number] Date of Birth: 02/06/1946 Referring Provider:   Flowsheet Row Pulmonary Rehab from 06/17/2022 in [facility] Cardiac and Pulmonary Rehab  Referring Provider R. Elsworth Soho       Encounter Date: 06/23/2022  Check In:  Session Check In - 06/23/22 1116       Check-In   Supervising physician immediately available to respond to emergencies See telemetry face sheet for immediately available ER MD    Location [facility]-Cardiac & Pulmonary Rehab    Staff Present Renita Papa, RN Abel Presto, MS, ACSM CEP, Exercise Physiologist;Noah Tickle, BS, Exercise Physiologist;Other   Gretchen Short BA, Exercise Science   Virtual Visit No    Medication changes reported     No    Fall or balance concerns reported    No    Warm-up and Cool-down Performed on first and last piece of equipment    Resistance Training Performed Yes    VAD Patient? No    PAD/SET Patient? No      Pain Assessment   Currently in Pain? No/denies                Social History   Tobacco Use  Smoking Status Former   Packs/day: 1.50   Years: 47.00   Additional pack years: 0.00   Total pack years: 70.50   Types: Cigarettes   Quit date: 02/20/2014   Years since quitting: 8.3  Smokeless Tobacco Never  Tobacco Comments   1-2 packs per day    Goals Met:  Independence with exercise equipment Exercise tolerated well No report of concerns or symptoms today Strength training completed today  Goals Unmet:  Not Applicable  Comments: First full day of exercise!  Patient was oriented to gym and equipment including functions, settings, policies, and procedures.  Patient's individual exercise prescription and treatment plan were reviewed.  All starting workloads were established based on the results of the 6 minute walk test done at initial orientation visit.  The plan for exercise progression was also introduced and progression will be customized based on  patient's performance and goals.'   Dr. Emily Filbert is Medical Director for [place].  Dr. Ottie Glazier is Medical Director for [facility] Pulmonary Rehabilitation.

## 2022-06-24 ENCOUNTER — Other Ambulatory Visit (HOSPITAL_COMMUNITY): Payer: Self-pay

## 2022-06-24 ENCOUNTER — Other Ambulatory Visit: Payer: Self-pay

## 2022-06-25 ENCOUNTER — Encounter: Payer: PPO | Admitting: *Deleted

## 2022-07-08 ENCOUNTER — Encounter: Payer: Self-pay | Admitting: *Deleted

## 2022-07-08 ENCOUNTER — Telehealth: Payer: Self-pay

## 2022-07-08 DIAGNOSIS — J42 Unspecified chronic bronchitis: Secondary | ICD-10-CM

## 2022-07-08 NOTE — Telephone Encounter (Signed)
Left message for patient for pulmonary rehab. Has not attended since 4/2.

## 2022-07-08 NOTE — Progress Notes (Signed)
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 build up overall strength and stamina.              Discharge Exercise Prescription (Final Exercise Prescription  Changes):  Exercise Prescription Changes - 06/24/22 0900       Response to Exercise   Blood Pressure (Admit) 122/56    Blood Pressure (Exercise) 144/70    Blood Pressure (Exit) 102/60    Heart Rate (Admit) 81 bpm    Heart Rate (Exercise) 101 bpm    Heart Rate (Exit) 90 bpm    Oxygen Saturation (Admit) 92 %    Oxygen Saturation (Exercise) 88 %    Oxygen Saturation (Exit) 93 %    Rating of Perceived Exertion (Exercise) 13    Perceived Dyspnea (Exercise) 1    Symptoms knee pain 4/10    Comments 1st full day of exercise    Duration Progress to 30 minutes of  aerobic without signs/symptoms of physical distress    Intensity THRR unchanged      Progression   Progression Continue to progress workloads to  maintain intensity without signs/symptoms of physical distress.    Average METs 1.82      Resistance Training   Training Prescription Yes    Weight 3 lb    Reps 10-15      Interval Training   Interval Training No      REL-XR   Level 1    Minutes 15      Track   Laps 15    Minutes 15    METs 1.82      Oxygen   Maintain Oxygen Saturation 88% or higher             Nutrition:  Target Goals: Understanding of nutrition guidelines, daily intake of sodium 1500mg , cholesterol 200mg , calories 30% from fat and 7% or less from saturated fats, daily to have 5 or more servings of fruits and vegetables.  Education: All About Nutrition: -Group instruction provided by verbal, written material, interactive activities, discussions, models, and posters to present general guidelines for heart healthy nutrition including fat, fiber, MyPlate, the role of sodium in heart healthy nutrition, utilization of the nutrition label, and utilization of this knowledge for meal planning. Follow up email sent as well. Written material given at graduation.   Biometrics:  Pre Biometrics - 06/17/22 1345       Pre Biometrics   Height 5' 9.25" (1.759 m)    Weight 218 lb 11.2 oz (99.2 kg)    Waist Circumference 51 inches    Hip Circumference 44 inches    Waist to Hip Ratio 1.16 %    BMI (Calculated) 32.06    Single Leg Stand --   not attempted due to concerns about balance             Nutrition Therapy Plan and Nutrition Goals:  Nutrition Therapy & Goals - 06/22/22 1351       Nutrition Therapy   Diet Heart healthy, low Na, T2DM MNT, Pulmonary MNT    Drug/Food Interactions Statins/Certain Fruits    Protein (specify units) 120g    Fiber 30 grams    Whole Grain Foods 3 servings    Saturated Fats 16 max. grams    Fruits and Vegetables 8 servings/day    Sodium 1.5 grams      Personal Nutrition Goals   Nutrition Goal ST: swap out the pork for another protein source some of the time -  chicken, fish, beans/lentils, greek yogurt, etc.  LT: continue with changes made, maintain A1C <7, limit red meat <1-2x/week, limit Na <1.5g/day    Comments 77 y.o. M admitted to pulmonary rehab for chronic bronchitis. PMHx  includes CAD, OSA, COPD, T2DM, DDD. Medications includes lipitor, calcium citrate-vitamin D, vitamin D3, CoQ10, jardiance, fenugreek, glipizide, magnesium, metformin, MVI with minerals, fish oil, omperazole, potassium, trazodone, tumeric, b12, vitmain C. A1C was 7% 06/02/22. B: 2 sausage biscuits (homemade country sausage) or "Daniel Plan" chia seed smoothie (with spinach and vanilla greek yogurt) for breakfast. L: sometimes nothing pulled pork sandwich (he will eat 100% whole wheat) and occassionally fried bolong[place] with cheese D: He eats mostly plant foods (beans, asparagus, broccolli, salads, spinach), vegetable soup, pork, chicken, and seafood. Bertran uses olive oil and sometimes butter, Kaliel reports using salt, but uses more pepper. He reports making changes last year and feels he is doing well. Drinks: unsweet tea, crystal lite, and unsweet green tea. Discussed general heart healthy eating and T2DM MNT.      Intervention Plan   Intervention Prescribe, educate and counsel regarding individualized specific dietary modifications aiming towards targeted core components such as weight, hypertension, lipid management, diabetes, heart failure and other comorbidities.    Expected Outcomes Short Term Goal: A plan has been developed with personal nutrition goals set during dietitian appointment.;Short Term Goal: Understand basic principles of dietary content, such as calories, fat, sodium, cholesterol and nutrients.;Long Term Goal: Adherence to prescribed nutrition plan.             Nutrition Assessments:  MEDIFICTS Score Key: ?70 Need to make dietary changes  40-70 Heart Healthy Diet ? 40 Therapeutic Level Cholesterol Diet  Flowsheet Row Pulmonary Rehab from 06/17/2022 in [facility]  Cardiac and Pulmonary Rehab  Picture Your Plate Total Score on Admission 61      Picture Your Plate Scores: <16 Unhealthy dietary pattern with much room for improvement. 41-50 Dietary pattern unlikely to meet recommendations for good health and room for improvement. 51-60 More healthful dietary pattern, with some room for improvement.  >60 Healthy dietary pattern, although there may be some specific behaviors that could be improved.   Nutrition Goals Re-Evaluation:   Nutrition Goals Discharge (Final Nutrition Goals Re-Evaluation):   Psychosocial: Target Goals: Acknowledge presence or absence of significant depression and/or stress, maximize coping skills, provide positive support system. Participant is able to verbalize types and ability to use techniques and skills needed for reducing stress and depression.   Education: Stress, Anxiety, and Depression - Group verbal and visual presentation to define topics covered.  Reviews how body is impacted by stress, anxiety, and depression.  Also discusses healthy ways to reduce stress and to treat/manage anxiety and depression.  Written material given at graduation.   Education: Sleep Hygiene -Provides group verbal and written instruction about how sleep can affect your health.  Define sleep hygiene, discuss sleep cycles and impact of sleep habits. Review good sleep hygiene tips.    Initial Review & Psychosocial Screening:  Initial Psych Review & Screening - 06/10/22 1348       Initial Review   Current issues with Current Stress Concerns    Source of Stress Concerns Chronic Illness    Comments Elkin states that he does not sleep well but since he has been on Trazedone he has been doing well. He has alot of back pain and wants to do more exercise to help with his shortness of breath.      Family Dynamics   Good Support System? Yes    Comments Juriel has his wife, kids and church group to look to for support.      Barriers    Psychosocial barriers to participate in program The  patient should benefit from training in stress management and relaxation.      Screening Interventions   Interventions Encouraged to exercise;To provide support and resources with identified psychosocial needs;Provide feedback about the scores to participant    Expected Outcomes Short Term goal: Utilizing psychosocial counselor, staff and physician to assist with identification of specific Stressors or current issues interfering with healing process. Setting desired goal for each stressor or current issue identified.;Long Term Goal: Stressors or current issues are controlled or eliminated.;Short Term goal: Identification and review with participant of any Quality of Life or Depression concerns found by scoring the questionnaire.;Long Term goal: The participant improves quality of Life and PHQ9 Scores as seen by post scores and/or verbalization of changes             Quality of Life Scores:  Scores of 19 and below usually indicate a poorer quality of life in these areas.  A difference of  2-3 points is a clinically meaningful difference.  A difference of 2-3 points in the total score of the Quality of Life Index has been associated with significant improvement in overall quality of life, self-image, physical symptoms, and general health in studies assessing change in quality of life.  PHQ-9: Review Flowsheet  More data exists      06/17/2022 06/10/2022 07/01/2021 02/19/2021 11/20/2020  Depression screen PHQ 2/9  Decreased Interest 0 0 0 0 0  Down, Depressed, Hopeless 0 0 0 0 0  PHQ - 2 Score 0 0 0 0 0  Altered sleeping 1 - - - -  Tired, decreased energy 1 - - - -  Change in appetite 0 - - - -  Feeling bad or failure about yourself  0 - - - -  Trouble concentrating 0 - - - -  Moving slowly or fidgety/restless 0 - - - -  Suicidal thoughts 0 - - - -  PHQ-9 Score 2 - - - -  Difficult doing work/chores Not difficult at all - - - -    Interpretation of Total Score  Total Score Depression Severity:  1-4 = Minimal depression, 5-9 = Mild depression, 10-14 = Moderate depression, 15-19 = Moderately severe depression, 20-27 = Severe depression   Psychosocial Evaluation and Intervention:  Psychosocial Evaluation - 06/10/22 1353       Psychosocial Evaluation & Interventions   Interventions Relaxation education;Encouraged to exercise with the program and follow exercise prescription;Stress management education    Comments Latavius states that he does not sleep well but since he has been on Trazedone he has been doing well. He has alot of back pain and wants to do more exercise to help with his shortness of breath.Avier has his wife, kids and church group to look to for support.    Expected Outcomes Short: Start HeartTrack to help with mood. Long: Maintain a healthy mental state    Continue Psychosocial Services  Follow up required by staff             Psychosocial Re-Evaluation:   Psychosocial Discharge (Final Psychosocial Re-Evaluation):   Education: Education Goals: Education classes will be provided on a weekly basis, covering required topics. Participant will state understanding/return demonstration of topics presented.  Learning Barriers/Preferences:  Learning Barriers/Preferences - 06/10/22 1347       Learning Barriers/Preferences   Learning Barriers Sight    Learning Preferences None             General Pulmonary Education Topics:  Infection Prevention: - Provides verbal and written  material to individual with discussion of infection control including proper hand washing and proper equipment cleaning during exercise session. Flowsheet Row Pulmonary Rehab from 06/17/2022 in [facility] Cardiac and Pulmonary Rehab  Date 06/17/22  Educator [facility]  Instruction Review Code 1- Verbalizes Understanding       Falls Prevention: - Provides verbal and written material to individual with discussion of falls  prevention and safety. Flowsheet Row Pulmonary Rehab from 06/17/2022 in [facility] Cardiac and Pulmonary Rehab  Date 06/17/22  Educator [facility]  Instruction Review Code 1- Verbalizes Understanding       Chronic Lung Disease Review: - Group verbal instruction with posters, models, PowerPoint presentations and videos,  to review new updates, new respiratory medications, new advancements in procedures and treatments. Providing information on websites and "800" numbers for continued self-education. Includes information about supplement oxygen, available portable oxygen systems, continuous and intermittent flow rates, oxygen safety, concentrators, and Medicare reimbursement for oxygen. Explanation of Pulmonary Drugs, including class, frequency, complications, importance of spacers, rinsing mouth after steroid MDI's, and proper cleaning methods for nebulizers. Review of basic lung anatomy and physiology related to function, structure, and complications of lung disease. Review of risk factors. Discussion about methods for diagnosing sleep apnea and types of masks and machines for OSA. Includes a review of the use of types of environmental controls: home humidity, furnaces, filters, dust mite/pet prevention, HEPA vacuums. Discussion about weather changes, air quality and the benefits of nasal washing. Instruction on Warning signs, infection symptoms, calling MD promptly, preventive modes, and value of vaccinations. Review of effective airway clearance, coughing and/or vibration techniques. Emphasizing that all should Create an Action Plan. Written material given at graduation. Flowsheet Row Pulmonary Rehab from 06/17/2022 in [facility] Cardiac and Pulmonary Rehab  Education need identified 06/17/22       AED/CPR: - Group verbal and written instruction with the use of models to demonstrate the basic use of the AED with the basic ABC's of resuscitation.    Anatomy and Cardiac Procedures: - Group verbal and visual  presentation and models provide information about basic cardiac anatomy and function. Reviews the testing methods done to diagnose heart disease and the outcomes of the test results. Describes the treatment choices: Medical Management, Angioplasty, or Coronary Bypass Surgery for treating various heart conditions including Myocardial Infarction, Angina, Valve Disease, and Cardiac Arrhythmias.  Written material given at graduation.   Medication Safety: - Group verbal and visual instruction to review commonly prescribed medications for heart and lung disease. Reviews the medication, class of the drug, and side effects. Includes the steps to properly store meds and maintain the prescription regimen.  Written material given at graduation.   Other: -Provides group and verbal instruction on various topics (see comments)   Knowledge Questionnaire Score:  Knowledge Questionnaire Score - 06/17/22 1347       Knowledge Questionnaire Score   Pre Score 17/18              Core Components/Risk Factors/Patient Goals at Admission:  Personal Goals and Risk Factors at Admission - 06/17/22 1348       Core Components/Risk Factors/Patient Goals on Admission    Weight Management Yes;Weight Loss    Intervention Weight Management: Develop a combined nutrition and exercise program designed to reach desired caloric intake, while maintaining appropriate intake of nutrient and fiber, sodium and fats, and appropriate energy expenditure required for the weight goal.;Weight Management: Provide education and appropriate resources to help participant work on and attain dietary goals.;Weight Management/Obesity: Establish reasonable short  term and long term weight goals.    Admit Weight 218 lb 11.2 oz (99.2 kg)    Goal Weight: Short Term 210 lb (95.3 kg)    Goal Weight: Long Term 190 lb (86.2 kg)    Expected Outcomes Short Term: Continue to assess and modify interventions until short term weight is achieved;Long Term:  Adherence to nutrition and physical activity/exercise program aimed toward attainment of established weight goal;Weight Loss: Understanding of general recommendations for a balanced deficit meal plan, which promotes 1-2 lb weight loss per week and includes a negative energy balance of [phone] kcal/d;Understanding recommendations for meals to include 15-35% energy as protein, 25-35% energy from fat, 35-60% energy from carbohydrates, less than 200mg  of dietary cholesterol, 20-35 gm of total fiber daily;Understanding of distribution of calorie intake throughout the day with the consumption of 4-5 meals/snacks    Improve shortness of breath with ADL's Yes    Intervention Provide education, individualized exercise plan and daily activity instruction to help decrease symptoms of SOB with activities of daily living.    Expected Outcomes Short Term: Improve cardiorespiratory fitness to achieve a reduction of symptoms when performing ADLs;Long Term: Be able to perform more ADLs without symptoms or delay the onset of symptoms    Diabetes Yes    Intervention Provide education about signs/symptoms and action to take for hypo/hyperglycemia.;Provide education about proper nutrition, including hydration, and aerobic/resistive exercise prescription along with prescribed medications to achieve blood glucose in normal ranges: Fasting glucose 65-99 mg/dL    Expected Outcomes Long Term: Attainment of HbA1C < 7%.;Short Term: Participant verbalizes understanding of the signs/symptoms and immediate care of hyper/hypoglycemia, proper foot care and importance of medication, aerobic/resistive exercise and nutrition plan for blood glucose control.    Hypertension Yes    Intervention Provide education on lifestyle modifcations including regular physical activity/exercise, weight management, moderate sodium restriction and increased consumption of fresh fruit, vegetables, and low fat dairy, alcohol moderation, and smoking  cessation.;Monitor prescription use compliance.    Expected Outcomes Short Term: Continued assessment and intervention until BP is < 140/60mm HG in hypertensive participants. < 130/54mm HG in hypertensive participants with diabetes, heart failure or chronic kidney disease.;Long Term: Maintenance of blood pressure at goal levels.    Lipids Yes    Intervention Provide education and support for participant on nutrition & aerobic/resistive exercise along with prescribed medications to achieve LDL 70mg , HDL >40mg .    Expected Outcomes Short Term: Participant states understanding of desired cholesterol values and is compliant with medications prescribed. Participant is following exercise prescription and nutrition guidelines.;Long Term: Cholesterol controlled with medications as prescribed, with individualized exercise RX and with personalized nutrition plan. Value goals: LDL < 70mg , HDL > 40 mg.             Education:Diabetes - Individual verbal and written instruction to review signs/symptoms of diabetes, desired ranges of glucose level fasting, after meals and with exercise. Acknowledge that pre and post exercise glucose checks will be done for 3 sessions at entry of program. Flowsheet Row Pulmonary Rehab from 06/17/2022 in [facility] Cardiac and Pulmonary Rehab  Date 06/17/22  Educator [facility]  Instruction Review Code 1- Verbalizes Understanding       Know Your Numbers and Heart Failure: - Group verbal and visual instruction to discuss disease risk factors for cardiac and pulmonary disease and treatment options.  Reviews associated critical values for Overweight/Obesity, Hypertension, Cholesterol, and Diabetes.  Discusses basics of heart failure: signs/symptoms and treatments.  Introduces Heart Failure Zone chart  for action plan for heart failure.  Written material given at graduation.   Core Components/Risk Factors/Patient Goals Review:    Core Components/Risk Factors/Patient Goals at Discharge  (Final Review):    ITP Comments:  ITP Comments     Row Name 06/10/22 1345 06/17/22 1326 06/22/22 1524 06/23/22 1120 07/08/22 1133   ITP Comments Virtual Visit completed. Patient informed on EP and RD appointment and 6 Minute walk test. Patient also informed of patient health questionnaires on My Chart. Patient Verbalizes understanding. Visit diagnosis can be found in [facility]22. Completed and gym orientation. Initial ITP created and sent for review to Dr. Jinny Sanders, Medical Director. Completed initial RD consultation First full day of exercise!  Patient was oriented to gym and equipment including functions, settings, policies, and procedures.  Patient's individual exercise prescription and treatment plan were reviewed.  All starting workloads were established based on the results of the 6 minute walk test done at initial orientation visit.  The plan for exercise progression was also introduced and progression will be customized based on patient's performance and goals.' Left message for patient for pulmonary rehab. Has not attended since 4/2.    Row Name 07/08/22 1333           ITP Comments 30 day review completed. ITP sent to Dr. Jinny Sanders, Medical Director of  Pulmonary Rehab. Continue with ITP unless changes are made by physician.                Comments: 30 day review

## 2022-07-09 ENCOUNTER — Encounter: Payer: PPO | Admitting: *Deleted

## 2022-07-09 DIAGNOSIS — J4489 Other specified chronic obstructive pulmonary disease: Secondary | ICD-10-CM | POA: Diagnosis not present

## 2022-07-09 DIAGNOSIS — J42 Unspecified chronic bronchitis: Secondary | ICD-10-CM

## 2022-07-09 LAB — GLUCOSE, CAPILLARY
Glucose-Capillary: 118 mg/dL — ABNORMAL HIGH (ref 70–99)
Glucose-Capillary: 117 mg/dL — ABNORMAL HIGH (ref 70–99)

## 2022-07-09 NOTE — Progress Notes (Signed)
Daily Session Note  Patient Details  Name: Howard Maxwell MRN: [id number] Date of Birth: 22-Nov-1945 Referring Provider:   Flowsheet Row Pulmonary Rehab from 06/17/2022 in [facility] Cardiac and Pulmonary Rehab  Referring Provider R. Vassie Loll       Encounter Date: 07/09/2022  Check In:  Session Check In - 07/09/22 1056       Check-In   Supervising physician immediately available to respond to emergencies See telemetry face sheet for immediately available ER MD    Location [facility]-Cardiac & Pulmonary Rehab    Staff Present Lanny Hurst, RN, ADN;Jessica Juanetta Gosling, MA, RCEP, CCRP, CCET;Noah Tickle, BS, Exercise Physiologist    Virtual Visit No    Medication changes reported     No    Fall or balance concerns reported    No    Warm-up and Cool-down Performed on first and last piece of equipment    Resistance Training Performed Yes    VAD Patient? No    PAD/SET Patient? No      Pain Assessment   Currently in Pain? No/denies                Social History   Tobacco Use  Smoking Status Former   Packs/day: 1.50   Years: 47.00   Additional pack years: 0.00   Total pack years: 70.50   Types: Cigarettes   Quit date: 02/20/2014   Years since quitting: 8.3  Smokeless Tobacco Never  Tobacco Comments   1-2 packs per day    Goals Met:  Independence with exercise equipment Exercise tolerated well No report of concerns or symptoms today Strength training completed today  Goals Unmet:  Not Applicable  Comments: Pt able to follow exercise prescription today without complaint.  Will continue to monitor for progression.    Dr. Bethann Punches is Medical Director for [facility] Cardiac Rehabilitation.  Dr. Vida Rigger is Medical Director for [facility] Pulmonary Rehabilitation.

## 2022-07-14 ENCOUNTER — Ambulatory Visit: Payer: PPO | Admitting: *Deleted

## 2022-07-15 ENCOUNTER — Ambulatory Visit: Payer: HMO | Admitting: Podiatry

## 2022-07-15 DIAGNOSIS — Z902 Acquired absence of lung [part of]: Secondary | ICD-10-CM | POA: Diagnosis not present

## 2022-07-15 DIAGNOSIS — I10 Essential (primary) hypertension: Secondary | ICD-10-CM | POA: Diagnosis not present

## 2022-07-15 DIAGNOSIS — M4807 Spinal stenosis, lumbosacral region: Secondary | ICD-10-CM | POA: Diagnosis not present

## 2022-07-15 DIAGNOSIS — I2510 Atherosclerotic heart disease of native coronary artery without angina pectoris: Secondary | ICD-10-CM | POA: Diagnosis not present

## 2022-07-15 DIAGNOSIS — Z0000 Encounter for general adult medical examination without abnormal findings: Secondary | ICD-10-CM | POA: Diagnosis not present

## 2022-07-15 DIAGNOSIS — C3491 Malignant neoplasm of unspecified part of right bronchus or lung: Secondary | ICD-10-CM | POA: Diagnosis not present

## 2022-07-15 DIAGNOSIS — I700 Atherosclerosis of aorta: Secondary | ICD-10-CM | POA: Diagnosis not present

## 2022-07-15 DIAGNOSIS — E1165 Type 2 diabetes mellitus with hyperglycemia: Secondary | ICD-10-CM | POA: Diagnosis not present

## 2022-07-15 DIAGNOSIS — Z6831 Body mass index (BMI) 31.0-31.9, adult: Secondary | ICD-10-CM | POA: Diagnosis not present

## 2022-07-15 DIAGNOSIS — Z87891 Personal history of nicotine dependence: Secondary | ICD-10-CM | POA: Diagnosis not present

## 2022-07-16 ENCOUNTER — Encounter: Payer: PPO | Admitting: *Deleted

## 2022-07-16 DIAGNOSIS — J42 Unspecified chronic bronchitis: Secondary | ICD-10-CM

## 2022-07-16 DIAGNOSIS — J4489 Other specified chronic obstructive pulmonary disease: Secondary | ICD-10-CM | POA: Diagnosis not present

## 2022-07-16 NOTE — Progress Notes (Signed)
Daily Session Note  Patient Details  Name: JASAUN CARN MRN: [id number] Date of Birth: 02-19-1946 Referring Provider:   Flowsheet Row Pulmonary Rehab from 06/17/2022 in [facility] Cardiac and Pulmonary Rehab  Referring Provider R. Vassie Loll       Encounter Date: 07/16/2022  Check In:  Session Check In - 07/16/22 1119       Check-In   Supervising physician immediately available to respond to emergencies See telemetry face sheet for immediately available ER MD    Location [facility]-Cardiac & Pulmonary Rehab    Staff Present Lanny Hurst, RN, ADN;Denise Rhew, PhD, RN, CNS, Josephine Cables, BS, Exercise Physiologist    Virtual Visit No    Medication changes reported     No    Fall or balance concerns reported    No    Warm-up and Cool-down Performed on first and last piece of equipment    Resistance Training Performed Yes    VAD Patient? No    PAD/SET Patient? No      Pain Assessment   Currently in Pain? No/denies                Social History   Tobacco Use  Smoking Status Former   Packs/day: 1.50   Years: 47.00   Additional pack years: 0.00   Total pack years: 70.50   Types: Cigarettes   Quit date: 02/20/2014   Years since quitting: 8.4  Smokeless Tobacco Never  Tobacco Comments   1-2 packs per day    Goals Met:  Independence with exercise equipment Exercise tolerated well No report of concerns or symptoms today Strength training completed today  Goals Unmet:  Not Applicable  Comments: Pt able to follow exercise prescription today without complaint.  Will continue to monitor for progression.    Dr. Bethann Punches is Medical Director for [facility] Cardiac Rehabilitation.  Dr. Vida Rigger is Medical Director for [facility] Pulmonary Rehabilitation.

## 2022-07-21 ENCOUNTER — Encounter: Payer: PPO | Admitting: *Deleted

## 2022-07-21 DIAGNOSIS — J4489 Other specified chronic obstructive pulmonary disease: Secondary | ICD-10-CM | POA: Diagnosis not present

## 2022-07-21 DIAGNOSIS — J42 Unspecified chronic bronchitis: Secondary | ICD-10-CM

## 2022-07-21 NOTE — Progress Notes (Signed)
Daily Session Note  Patient Details  Name: MIKING USREY MRN: [id number] Date of Birth: 03-27-1945 Referring Provider:   Flowsheet Row Pulmonary Rehab from 06/17/2022 in [facility] Cardiac and Pulmonary Rehab  Referring Provider R. Vassie Loll       Encounter Date: 07/21/2022  Check In:  Session Check In - 07/21/22 1146       Check-In   Supervising physician immediately available to respond to emergencies See telemetry face sheet for immediately available ER MD    Location [facility]-Cardiac & Pulmonary Rehab    Staff Present Cora Collum, RN, BSN, CCRP;Jessica Leon, MA, RCEP, CCRP, Zackery Barefoot, MS, ACSM CEP, Exercise Physiologist;Noah Tickle, BS, Exercise Physiologist    Virtual Visit No    Medication changes reported     No    Fall or balance concerns reported    No    Warm-up and Cool-down Performed on first and last piece of equipment    Resistance Training Performed Yes    VAD Patient? No    PAD/SET Patient? No      Pain Assessment   Currently in Pain? No/denies                Social History   Tobacco Use  Smoking Status Former   Packs/day: 1.50   Years: 47.00   Additional pack years: 0.00   Total pack years: 70.50   Types: Cigarettes   Quit date: 02/20/2014   Years since quitting: 8.4  Smokeless Tobacco Never  Tobacco Comments   1-2 packs per day    Goals Met:  Proper associated with RPD/PD & O2 Sat Independence with exercise equipment Exercise tolerated well No report of concerns or symptoms today  Goals Unmet:  Not Applicable  Comments: Pt able to follow exercise prescription today without complaint.  Will continue to monitor for progression.    Dr. Bethann Punches is Medical Director for [facility] Cardiac Rehabilitation.  Dr. Vida Rigger is Medical Director for [facility] Pulmonary Rehabilitation.

## 2022-07-23 ENCOUNTER — Encounter: Payer: PPO | Attending: Pulmonary Disease | Admitting: *Deleted

## 2022-07-23 DIAGNOSIS — J42 Unspecified chronic bronchitis: Secondary | ICD-10-CM | POA: Diagnosis not present

## 2022-07-23 DIAGNOSIS — Z87891 Personal history of nicotine dependence: Secondary | ICD-10-CM | POA: Insufficient documentation

## 2022-07-23 DIAGNOSIS — J432 Centrilobular emphysema: Secondary | ICD-10-CM | POA: Diagnosis not present

## 2022-07-23 DIAGNOSIS — J9611 Chronic respiratory failure with hypoxia: Secondary | ICD-10-CM | POA: Diagnosis not present

## 2022-07-23 NOTE — Progress Notes (Signed)
Daily Session Note  Patient Details  Name: Howard Maxwell MRN: [id number] Date of Birth: [birth year] Referring Provider:   Flowsheet Row Pulmonary Rehab from 06/17/2022 in [facility] Cardiac and Pulmonary Rehab  Referring Provider R. Vassie Loll       Encounter Date: 07/23/2022  Check In:  Session Check In - 07/23/22 1028       Check-In   Supervising physician immediately available to respond to emergencies See telemetry face sheet for immediately available ER MD    Location [facility]-Cardiac & Pulmonary Rehab    Staff Present Susann Givens, RN BSN;Laureen Manson Passey, BS, RRT, CPFT;Jessica [place], MA, RCEP, CCRP, Zackery Barefoot, MS, ACSM CEP, Exercise Physiologist    Virtual Visit No    Medication changes reported     No    Fall or balance concerns reported    No    Warm-up and Cool-down Performed on first and last piece of equipment    Resistance Training Performed Yes    VAD Patient? No    PAD/SET Patient? No      Pain Assessment   Currently in Pain? No/denies                Social History   Tobacco Use  Smoking Status Former   Packs/day: 1.50   Years: 47.00   Additional pack years: 0.00   Total pack years: 70.50   Types: Cigarettes   Quit date: 02/20/2014   Years since quitting: 8.4  Smokeless Tobacco Never  Tobacco Comments   1-2 packs per day    Goals Met:  Independence with exercise equipment Exercise tolerated well No report of concerns or symptoms today Strength training completed today  Goals Unmet:  Not Applicable  Comments: Pt able to follow exercise prescription today without complaint.  Will continue to monitor for progression.    Dr. Bethann Punches is Medical Director for [facility] Cardiac Rehabilitation.  Dr. Vida Rigger is Medical Director for [facility] Pulmonary Rehabilitation.

## 2022-07-28 ENCOUNTER — Encounter: Payer: Self-pay | Admitting: *Deleted

## 2022-07-29 ENCOUNTER — Telehealth: Payer: Self-pay | Admitting: Pulmonary Disease

## 2022-07-29 NOTE — Telephone Encounter (Signed)
Raven looks like you worked on this one

## 2022-07-29 NOTE — Telephone Encounter (Signed)
ONO on inspire /RA to decide about oxygen use during sleep (ON AVS) from last visit he needs to know what the status of him getting it

## 2022-07-30 NOTE — Telephone Encounter (Signed)
Donald Siva, [place]; [place], Tomie China; Kathe Becton I am messaging the branch regarding this pt.  I will let you know once I know.  Thanks!

## 2022-08-01 ENCOUNTER — Other Ambulatory Visit (HOSPITAL_COMMUNITY): Payer: Self-pay

## 2022-08-03 ENCOUNTER — Other Ambulatory Visit: Payer: Self-pay

## 2022-08-04 ENCOUNTER — Encounter: Payer: Self-pay | Admitting: *Deleted

## 2022-08-04 DIAGNOSIS — J42 Unspecified chronic bronchitis: Secondary | ICD-10-CM

## 2022-08-04 NOTE — Telephone Encounter (Signed)
Hebert Soho; Sharene Butters, [place]; [place], [place]; Kathe Becton Update on ONO. this was the response I received.  "I just spoke to someone at [employer]. This one was on hold for insurance purposes which made no sense due to pt has already done these two other times.  She advised they are out of network with his insurance, and it would be private pay to patient. They are shipping out ONO today. The pt should receive in a couple of days. I will call pt and let him know it's coming.    Thanks!"

## 2022-08-05 ENCOUNTER — Encounter: Payer: Self-pay | Admitting: *Deleted

## 2022-08-05 ENCOUNTER — Ambulatory Visit (INDEPENDENT_AMBULATORY_CARE_PROVIDER_SITE_OTHER): Payer: PPO | Admitting: Podiatry

## 2022-08-05 ENCOUNTER — Encounter: Payer: Self-pay | Admitting: Podiatry

## 2022-08-05 DIAGNOSIS — B351 Tinea unguium: Secondary | ICD-10-CM | POA: Diagnosis not present

## 2022-08-05 DIAGNOSIS — M79676 Pain in unspecified toe(s): Secondary | ICD-10-CM

## 2022-08-05 DIAGNOSIS — E1165 Type 2 diabetes mellitus with hyperglycemia: Secondary | ICD-10-CM

## 2022-08-05 DIAGNOSIS — D2371 Other benign neoplasm of skin of right lower limb, including hip: Secondary | ICD-10-CM

## 2022-08-05 DIAGNOSIS — D2372 Other benign neoplasm of skin of left lower limb, including hip: Secondary | ICD-10-CM | POA: Diagnosis not present

## 2022-08-05 DIAGNOSIS — J42 Unspecified chronic bronchitis: Secondary | ICD-10-CM

## 2022-08-05 NOTE — Progress Notes (Signed)
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 understanding of Target Heart Rate Range (THRR);Understanding of Exercise Prescription;Increase Strength and Stamina;Able to check pulse independently;Able to understand and use Dyspnea scale Increase Physical Activity;Increase Strength and Stamina;Understanding of Exercise Prescription Increase Physical Activity;Increase Strength and Stamina;Understanding of Exercise Prescription Increase Physical Activity;Increase Strength and Stamina;Understanding of Exercise Prescription Increase Physical Activity;Increase Strength and Stamina;Understanding of Exercise Prescription   Comments Reviewed RPE scale, THR and program prescription with pt today.  Pt voiced understanding and was given a copy of goals to take home. Kermith did well for his first session of rehab. He was not able to tolerate the XR due to knee pain and will  make sure he is on a new machine next time to accommodate his MSK needs. He was able to walk 15 laps on the track. His O2 levels stayed 88% and above. We will continue to monitor as he progresses in the program. Tyland has not attended rehab since his first session on 06/23/2022. We will call patient to see when he would like to return to rehab. Deveron had been out of rehab sessions due to not feeling well and back pain. He returned back and completed 2 more sessions. He was able to walk 22 laps on the track! He also increased to level 4 on the T4 Nustep. RPEs are appropriate. We will continue to monitor. Oleg is doing well in rehab. He has consistently walked up to 17 laps on the track. He also has worked at level 1 on the arm crank and level 2 on the T4 nustep. He has continued to use 3 lb hand weights for resistance training as well. We will continue to monitor his progress in the program.   Expected Outcomes Short: Use RPE daily to regulate intensity.  Long: Follow program prescription in THR. Short: Continue initial exercise prescription Long: Build up overall strength and stamina Short: Return to rehab. Long: Continue to build up overall strength and stamina. Short: Slowly increase laps on track Long: Continue to increse overall MET level and stamina Short: Slowly increase laps on track. Long: Continue to improve strength and stamina.            Discharge Exercise Prescription (Final Exercise Prescription Changes):  Exercise Prescription Changes - 08/03/22 1400       Response to Exercise   Blood Pressure (Admit) 120/70    Blood Pressure (Exercise) 160/80    Blood Pressure (Exit) 130/78    Heart Rate (Admit) 85 bpm    Heart Rate (Exercise) 99 bpm    Heart Rate (Exit) 83 bpm    Oxygen Saturation (Admit) 93 %    Oxygen Saturation (Exercise) 88 %    Oxygen Saturation (Exit) 90 %    Rating of Perceived Exertion (Exercise) 13    Perceived Dyspnea (Exercise) 1    Symptoms SOB    Duration  Progress to 30 minutes of  aerobic without signs/symptoms of physical distress    Intensity THRR unchanged      Progression   Progression Continue to progress workloads to maintain intensity without signs/symptoms of physical distress.    Average METs 1.84      Resistance Training   Training Prescription Yes    Weight 3 lb    Reps 10-15      Interval Training   Interval Training No      NuStep   Level 2    Minutes 15    METs 2.5      Arm Ergometer   Level  1    Minutes 15    METs 1      Track   Laps 17    Minutes 15    METs 1.92      Oxygen   Maintain Oxygen Saturation 88% or higher             Nutrition:  Target Goals: Understanding of nutrition guidelines, daily intake of sodium 1500mg , cholesterol 200mg , calories 30% from fat and 7% or less from saturated fats, daily to have 5 or more servings of fruits and vegetables.  Education: All About Nutrition: -Group instruction provided by verbal, written material, interactive activities, discussions, models, and posters to present general guidelines for heart healthy nutrition including fat, fiber, MyPlate, the role of sodium in heart healthy nutrition, utilization of the nutrition label, and utilization of this knowledge for meal planning. Follow up email sent as well. Written material given at graduation. Flowsheet Row Pulmonary Rehab from 07/23/2022 in [facility] Pul[facility]hab  Date 07/09/22  Alberteen Sam 2]  Educator [facility]  Instruction Review Code 1- Verbalizes Understanding       Biometrics:  Pre Biometrics - 06/17/22 1345       Pre Biometrics   Height 5' 9.25" (1.759 m)    Weight 218 lb 11.2 oz (99.2 kg)    Waist Circumference 51 inches    Hip Circumference 44 inches    Waist to Hip Ratio 1.16 %    BMI (Calculated) 32.06    Single Leg Stand --   not attempted due to concerns about balance             Nutrition Therapy Plan and Nutrition Goals:  Nutrition Therapy & Goals - 06/22/22 1351        Nutrition Therapy   Diet Heart healthy, low Na, T2DM MNT, Pulmonary MNT    Drug/Food Interactions Statins/Certain Fruits    Protein (specify units) 120g    Fiber 30 grams    Whole Grain Foods 3 servings    Saturated Fats 16 max. grams    Fruits and Vegetables 8 servings/day    Sodium 1.5 grams      Personal Nutrition Goals   Nutrition Goal ST: swap out the pork for another protein source some of the time - chicken, fish, beans/lentils, greek yogurt, etc.  LT: continue with changes made, maintain A1C <7, limit red meat <1-2x/week, limit Na <1.5g/day    Comments 77 y.o. M admitted to pulmonary rehab for chronic bronchitis. PMHx includes CAD, OSA, COPD, T2DM, DDD. Medications includes lipitor, calcium citrate-vitamin D, vitamin D3, CoQ10, jardiance, fenugreek, glipizide, magnesium, metformin, MVI with minerals, fish oil, omperazole, potassium, trazodone, tumeric, b12, vitmain C. A1C was 7% 06/02/22. B: 2 sausage biscuits (homemade country sausage) or "Daniel Plan" chia seed smoothie (with spinach and vanilla greek yogurt) for breakfast. L: sometimes nothing pulled pork sandwich (he will eat 100% whole wheat) and occassionally fried bolonga with cheese D: He eats mostly plant foods (beans, asparagus, broccolli, salads, spinach), vegetable soup, pork, chicken, and seafood. Ruxin uses olive oil and sometimes butter, Alquan reports using salt, but uses more pepper. He reports making changes last year and feels he is doing well. Drinks: unsweet tea, crystal lite, and unsweet green tea. Discussed general heart healthy eating and T2DM MNT.      Intervention Plan   Intervention Prescribe, educate and counsel regarding individualized specific dietary modifications aiming towards targeted core components such as weight, hypertension, lipid management, diabetes, heart failure and other comorbidities.  Expected Outcomes Short Term Goal: A plan has been developed with personal nutrition goals set during dietitian  appointment.;Short Term Goal: Understand basic principles of dietary content, such as calories, fat, sodium, cholesterol and nutrients.;Long Term Goal: Adherence to prescribed nutrition plan.             Nutrition Assessments:  MEDIFICTS Score Key: ?70 Need to make dietary changes  40-70 Heart Healthy Diet ? 40 Therapeutic Level Cholesterol Diet  Flowsheet Row Pulmonary Rehab from 06/17/2022 in [facility] Cardiac and Pulmonary Rehab  Picture Your Plate Total Score on Admission 61      Picture Your Plate Scores: <16 Unhealthy dietary pattern with much room for improvement. 41-50 Dietary pattern unlikely to meet recommendations for good health and room for improvement. 51-60 More healthful dietary pattern, with some room for improvement.  >60 Healthy dietary pattern, although there may be some specific behaviors that could be improved.   Nutrition Goals Re-Evaluation:   Nutrition Goals Discharge (Final Nutrition Goals Re-Evaluation):   Psychosocial: Target Goals: Acknowledge presence or absence of significant depression and/or stress, maximize coping skills, provide positive support system. Participant is able to verbalize types and ability to use techniques and skills needed for reducing stress and depression.   Education: Stress, Anxiety, and Depression - Group verbal and visual presentation to define topics covered.  Reviews how body is impacted by stress, anxiety, and depression.  Also discusses healthy ways to reduce stress and to treat/manage anxiety and depression.  Written material given at graduation.   Education: Sleep Hygiene -Provides group verbal and written instruction about how sleep can affect your health.  Define sleep hygiene, discuss sleep cycles and impact of sleep habits. Review good sleep hygiene tips.    Initial Review & Psychosocial Screening:  Initial Psych Review & Screening - 06/10/22 1348       Initial Review   Current issues with Current Stress  Concerns    Source of Stress Concerns Chronic Illness    Comments Armani states that he does not sleep well but since he has been on Trazedone he has been doing well. He has alot of back pain and wants to do more exercise to help with his shortness of breath.      Family Dynamics   Good Support System? Yes    Comments Caitlin has his wife, kids and church group to look to for support.      Barriers   Psychosocial barriers to participate in program The patient should benefit from training in stress management and relaxation.      Screening Interventions   Interventions Encouraged to exercise;To provide support and resources with identified psychosocial needs;Provide feedback about the scores to participant    Expected Outcomes Short Term goal: Utilizing psychosocial counselor, staff and physician to assist with identification of specific Stressors or current issues interfering with healing process. Setting desired goal for each stressor or current issue identified.;Long Term Goal: Stressors or current issues are controlled or eliminated.;Short Term goal: Identification and review with participant of any Quality of Life or Depression concerns found by scoring the questionnaire.;Long Term goal: The participant improves quality of Life and PHQ9 Scores as seen by post scores and/or verbalization of changes             Quality of Life Scores:  Scores of 19 and below usually indicate a poorer quality of life in these areas.  A difference of  2-3 points is a clinically meaningful difference.  A difference of 2-3 points  in the total score of the Quality of Life Index has been associated with significant improvement in overall quality of life, self-image, physical symptoms, and general health in studies assessing change in quality of life.  PHQ-9: Review Flowsheet  More data exists      06/17/2022 06/10/2022 07/01/2021 02/19/2021 11/20/2020  Depression screen PHQ 2/9  Decreased Interest 0 0 0 0 0   Down, Depressed, Hopeless 0 0 0 0 0  PHQ - 2 Score 0 0 0 0 0  Altered sleeping 1 - - - -  Tired, decreased energy 1 - - - -  Change in appetite 0 - - - -  Feeling bad or failure about yourself  0 - - - -  Trouble concentrating 0 - - - -  Moving slowly or fidgety/restless 0 - - - -  Suicidal thoughts 0 - - - -  PHQ-9 Score 2 - - - -  Difficult doing work/chores Not difficult at all - - - -   Interpretation of Total Score  Total Score Depression Severity:  1-4 = Minimal depression, 5-9 = Mild depression, 10-14 = Moderate depression, 15-19 = Moderately severe depression, 20-27 = Severe depression   Psychosocial Evaluation and Intervention:  Psychosocial Evaluation - 06/10/22 1353       Psychosocial Evaluation & Interventions   Interventions Relaxation education;Encouraged to exercise with the program and follow exercise prescription;Stress management education    Comments Branden states that he does not sleep well but since he has been on Trazedone he has been doing well. He has alot of back pain and wants to do more exercise to help with his shortness of breath.Aaronn has his wife, kids and church group to look to for support.    Expected Outcomes Short: Start HeartTrack to help with mood. Long: Maintain a healthy mental state    Continue Psychosocial Services  Follow up required by staff             Psychosocial Re-Evaluation:   Psychosocial Discharge (Final Psychosocial Re-Evaluation):   Education: Education Goals: Education classes will be provided on a weekly basis, covering required topics. Participant will state understanding/return demonstration of topics presented.  Learning Barriers/Preferences:  Learning Barriers/Preferences - 06/10/22 1347       Learning Barriers/Preferences   Learning Barriers Sight    Learning Preferences None             General Pulmonary Education Topics:  Infection Prevention: - Provides verbal and written material to individual  with discussion of infection control including proper hand washing and proper equipment cleaning during exercise session. Flowsheet Row Pulmonary Rehab from 07/23/2022 in [facility] Cardiac and Pulmonary Rehab  Date 06/17/22  Educator [facility]  Instruction Review Code 1- Verbalizes Understanding       Falls Prevention: - Provides verbal and written material to individual with discussion of falls prevention and safety. Flowsheet Row Pulmonary Rehab from 07/23/2022 in [facility] Cardiac and Pulmonary Rehab  Date 06/17/22  Educator [facility]  Instruction Review Code 1- Verbalizes Understanding       Chronic Lung Disease Review: - Group verbal instruction with posters, models, PowerPoint presentations and videos,  to review new updates, new respiratory medications, new advancements in procedures and treatments. Providing information on websites and "800" numbers for continued self-education. Includes information about supplement oxygen, available portable oxygen systems, continuous and intermittent flow rates, oxygen safety, concentrators, and Medicare reimbursement for oxygen. Explanation of Pulmonary Drugs, including class, frequency, complications, importance of spacers, rinsing mouth after steroid  MDI's, and proper cleaning methods for nebulizers. Review of basic lung anatomy and physiology related to function, structure, and complications of lung disease. Review of risk factors. Discussion about methods for diagnosing sleep apnea and types of masks and machines for OSA. Includes a review of the use of types of environmental controls: home humidity, furnaces, filters, dust mite/pet prevention, HEPA vacuums. Discussion about weather changes, air quality and the benefits of nasal washing. Instruction on Warning signs, infection symptoms, calling MD promptly, preventive modes, and value of vaccinations. Review of effective airway clearance, coughing and/or vibration techniques. Emphasizing that all should Create an Action Plan.  Written material given at graduation. Flowsheet Row Pulmonary Rehab from 07/23/2022 in [facility] Cardiac and Pulmonary Rehab  Education need identified 06/17/22       AED/CPR: - Group verbal and written instruction with the use of models to demonstrate the basic use of the AED with the basic ABC's of resuscitation.    Anatomy and Cardiac Procedures: - Group verbal and visual presentation and models provide information about basic cardiac anatomy and function. Reviews the testing methods done to diagnose heart disease and the outcomes of the test results. Describes the treatment choices: Medical Management, Angioplasty, or Coronary Bypass Surgery for treating various heart conditions including Myocardial Infarction, Angina, Valve Disease, and Cardiac Arrhythmias.  Written material given at graduation. Flowsheet Row Pulmonary Rehab from 07/23/2022 in [facility] Cardiac and Pulmonary Rehab  Date 07/16/22  Educator SB  Instruction Review Code 1- Verbalizes Understanding       Medication Safety: - Group verbal and visual instruction to review commonly prescribed medications for heart and lung disease. Reviews the medication, class of the drug, and side effects. Includes the steps to properly store meds and maintain the prescription regimen.  Written material given at graduation. Flowsheet Row Pulmonary Rehab from 07/23/2022 in [facility] Cardiac and Pulmonary Rehab  Date 07/23/22  Educator SB  Instruction Review Code 1- Verbalizes Understanding       Other: -Provides group and verbal instruction on various topics (see comments)   Knowledge Questionnaire Score:  Knowledge Questionnaire Score - 06/17/22 1347       Knowledge Questionnaire Score   Pre Score 17/18              Core Components/Risk Factors/Patient Goals at Admission:  Personal Goals and Risk Factors at Admission - 06/17/22 1348       Core Components/Risk Factors/Patient Goals on Admission    Weight Management Yes;Weight Loss     Intervention Weight Management: Develop a combined nutrition and exercise program designed to reach desired caloric intake, while maintaining appropriate intake of nutrient and fiber, sodium and fats, and appropriate energy expenditure required for the weight goal.;Weight Management: Provide education and appropriate resources to help participant work on and attain dietary goals.;Weight Management/Obesity: Establish reasonable short term and long term weight goals.    Admit Weight 218 lb 11.2 oz (99.2 kg)    Goal Weight: Short Term 210 lb (95.3 kg)    Goal Weight: Long Term 190 lb (86.2 kg)    Expected Outcomes Short Term: Continue to assess and modify interventions until short term weight is achieved;Long Term: Adherence to nutrition and physical activity/exercise program aimed toward attainment of established weight goal;Weight Loss: Understanding of general recommendations for a balanced deficit meal plan, which promotes 1-2 lb weight loss per week and includes a negative energy balance of [phone] kcal/d;Understanding recommendations for meals to include 15-35% energy as protein, 25-35% energy from fat, 35-60% energy  from carbohydrates, less than 200mg  of dietary cholesterol, 20-35 gm of total fiber daily;Understanding of distribution of calorie intake throughout the day with the consumption of 4-5 meals/snacks    Improve shortness of breath with ADL's Yes    Intervention Provide education, individualized exercise plan and daily activity instruction to help decrease symptoms of SOB with activities of daily living.    Expected Outcomes Short Term: Improve cardiorespiratory fitness to achieve a reduction of symptoms when performing ADLs;Long Term: Be able to perform more ADLs without symptoms or delay the onset of symptoms    Diabetes Yes    Intervention Provide education about signs/symptoms and action to take for hypo/hyperglycemia.;Provide education about proper nutrition, including hydration, and  aerobic/resistive exercise prescription along with prescribed medications to achieve blood glucose in normal ranges: Fasting glucose 65-99 mg/dL    Expected Outcomes Long Term: Attainment of HbA1C < 7%.;Short Term: Participant verbalizes understanding of the signs/symptoms and immediate care of hyper/hypoglycemia, proper foot care and importance of medication, aerobic/resistive exercise and nutrition plan for blood glucose control.    Hypertension Yes    Intervention Provide education on lifestyle modifcations including regular physical activity/exercise, weight management, moderate sodium restriction and increased consumption of fresh fruit, vegetables, and low fat dairy, alcohol moderation, and smoking cessation.;Monitor prescription use compliance.    Expected Outcomes Short Term: Continued assessment and intervention until BP is < 140/12mm HG in hypertensive participants. < 130/26mm HG in hypertensive participants with diabetes, heart failure or chronic kidney disease.;Long Term: Maintenance of blood pressure at goal levels.    Lipids Yes    Intervention Provide education and support for participant on nutrition & aerobic/resistive exercise along with prescribed medications to achieve LDL 70mg , HDL >40mg .    Expected Outcomes Short Term: Participant states understanding of desired cholesterol values and is compliant with medications prescribed. Participant is following exercise prescription and nutrition guidelines.;Long Term: Cholesterol controlled with medications as prescribed, with individualized exercise RX and with personalized nutrition plan. Value goals: LDL < 70mg , HDL > 40 mg.             Education:Diabetes - Individual verbal and written instruction to review signs/symptoms of diabetes, desired ranges of glucose level fasting, after meals and with exercise. Acknowledge that pre and post exercise glucose checks will be done for 3 sessions at entry of program. Flowsheet Row Pulmonary  Rehab from 07/23/2022 in [facility] Cardiac and Pulmonary Rehab  Date 06/17/22  Educator [facility]  Instruction Review Code 1- Verbalizes Understanding       Know Your Numbers and Heart Failure: - Group verbal and visual instruction to discuss disease risk factors for cardiac and pulmonary disease and treatment options.  Reviews associated critical values for Overweight/Obesity, Hypertension, Cholesterol, and Diabetes.  Discusses basics of heart failure: signs/symptoms and treatments.  Introduces Heart Failure Zone chart for action plan for heart failure.  Written material given at graduation.   Core Components/Risk Factors/Patient Goals Review:    Core Components/Risk Factors/Patient Goals at Discharge (Final Review):    ITP Comments:  ITP Comments     Row Name 06/10/22 1345 06/17/22 1326 06/22/22 1524 06/23/22 1120 07/08/22 1133   ITP Comments Virtual Visit completed. Patient informed on EP and RD appointment and 6 Minute walk test. Patient also informed of patient health questionnaires on My Chart. Patient Verbalizes understanding. Visit diagnosis can be found in [facility] 06/01/2022. Completed and gym orientation. Initial ITP created and sent for review to Dr. Jinny Sanders, Medical Director. Completed initial RD consultation  First full day of exercise!  Patient was oriented to gym and equipment including functions, settings, policies, and procedures.  Patient's individual exercise prescription and treatment plan were reviewed.  All starting workloads were established based on the results of the 6 minute walk test done at initial orientation visit.  The plan for exercise progression was also introduced and progression will be customized based on patient's performance and goals.' Left message for patient for pulmonary rehab. Has not attended since 4/2.    Row Name 07/08/22 1333 08/04/22 0904 08/05/22 0921       ITP Comments 30 day review completed. ITP sent to Dr. Jinny Sanders, Medical Director of   Pulmonary Rehab. Continue with ITP unless changes are made by physician. Allante has not attended since 07/23/22.  Jurgen has called out last week and this week has appt conflicts.  Thus due to lack of attendance we have not been able to assess for goals. 30 Day review completed. Medical Director ITP review done, changes made as directed, and signed approval by Medical Director.   absent since 5/2              Comments:

## 2022-08-05 NOTE — Progress Notes (Signed)
He presents today for chief complaint of painful elongated toenails with calluses plantarly.  States that this 1 feels like more pain on the bone as he points to the plantar aspect of the fourth metatarsal left foot.  Objective: Vital signs stable alert oriented x 3 pulses are palpable.  Rigid hammertoe deformities.  He does have some loss of fat pad.  Toenails are long thick yellow and dystrophic.  Assessment: Pain limb secondary to onychomycosis and benign skin lesions bilaterally.  Plan: Debrided benign skin lesions no iatrogenic lesions toenails 1 through 5 were debrided.

## 2022-08-06 ENCOUNTER — Encounter: Payer: PPO | Admitting: *Deleted

## 2022-08-11 ENCOUNTER — Encounter: Payer: PPO | Admitting: *Deleted

## 2022-08-11 DIAGNOSIS — J449 Chronic obstructive pulmonary disease, unspecified: Secondary | ICD-10-CM | POA: Diagnosis not present

## 2022-08-11 DIAGNOSIS — J42 Unspecified chronic bronchitis: Secondary | ICD-10-CM

## 2022-08-11 NOTE — Telephone Encounter (Signed)
RA, please advise on pt's overnight O2 test.

## 2022-08-11 NOTE — Progress Notes (Signed)
Daily Session Note  Patient Details  Name: ANATOLE SANGUINO MRN: [id number] Date of Birth: March 13, 1946 Referring Provider:   Flowsheet Row Pulmonary Rehab from 06/17/2022 in [facility]  Referring Provider R. Vassie Loll       Encounter Date: 08/11/2022  Check In:  Session Check In - 08/11/22 1149       Check-In   Supervising physician immediately available to respond to emergencies See telemetry face sheet for immediately available ER MD    Location [facility]-Cardiac & Pulmonary Rehab    Staff Present Cora Collum, RN, BSN, CCRP;Jessica Monterey, MA, RCEP, CCRP, Zackery Barefoot, MS, ACSM CEP, Exercise Physiologist;Noah Tickle, BS, Exercise Physiologist    Virtual Visit No    Medication changes reported     No    Fall or balance concerns reported    No    Warm-up and Cool-down Performed on first and last piece of equipment    Resistance Training Performed Yes    VAD Patient? No    PAD/SET Patient? No      Pain Assessment   Currently in Pain? No/denies                Social History   Tobacco Use  Smoking Status Former   Packs/day: 1.50   Years: 47.00   Additional pack years: 0.00   Total pack years: 70.50   Types: Cigarettes   Quit date: 02/20/2014   Years since quitting: 8.4  Smokeless Tobacco Never  Tobacco Comments   1-2 packs per day    Goals Met:  Proper associated with RPD/PD & O2 Sat Independence with exercise equipment Exercise tolerated well No report of concerns or symptoms today  Goals Unmet:  Not Applicable  Comments: Pt able to follow exercise prescription today without complaint.  Will continue to monitor for progression.    Dr. Bethann Punches is Medical Director for [facility] Cardiac Rehabilitation.  Dr. Vida Rigger is Medical Director for [facility] Pulmonary Rehabilitation.

## 2022-08-11 NOTE — Telephone Encounter (Signed)
PT calling to go over overnight O2 test please advise he sent back last friday

## 2022-08-12 NOTE — Telephone Encounter (Signed)
Called and spoke with patient. Patient stated that he mailed the ONO equipment back in last Friday to [employer]. Will try to see if his results is back. Patient also stated that his oxygen has been really good and he wants to know if he can call the dme company and get them to pick it up.   RA, please advise.

## 2022-08-13 ENCOUNTER — Ambulatory Visit: Payer: PPO

## 2022-08-19 ENCOUNTER — Encounter: Payer: PPO | Admitting: *Deleted

## 2022-08-19 DIAGNOSIS — J42 Unspecified chronic bronchitis: Secondary | ICD-10-CM | POA: Diagnosis not present

## 2022-08-19 NOTE — Progress Notes (Signed)
Daily Session Note  Patient Details  Name: Howard Maxwell MRN: [id number] Date of Birth: 1945-06-01 Referring Provider:   Flowsheet Row Pulmonary Rehab from 06/17/2022 in [facility] Cardiac and Pulmonary Rehab  Referring Provider R. Vassie Loll       Encounter Date: 08/19/2022  Check In:  Session Check In - 08/19/22 1420       Check-In   Supervising physician immediately available to respond to emergencies See telemetry face sheet for immediately available ER MD    Location [facility]-Cardiac & Pulmonary Rehab    Staff Present Susann Givens, RN BSN;Jessica Juanetta Gosling, MA, RCEP, CCRP, Theodore Demark, RN, [place]    Virtual Visit No    Medication changes reported     No    Fall or balance concerns reported    No    Warm-up and Cool-down Performed on first and last piece of equipment    Resistance Training Performed Yes    VAD Patient? No    PAD/SET Patient? No      Pain Assessment   Currently in Pain? No/denies                Social History   Tobacco Use  Smoking Status Former   Packs/day: 1.50   Years: 47.00   Additional pack years: 0.00   Total pack years: 70.50   Types: Cigarettes   Quit date: 02/20/2014   Years since quitting: 8.4  Smokeless Tobacco Never  Tobacco Comments   1-2 packs per day    Goals Met:  Independence with exercise equipment Exercise tolerated well No report of concerns or symptoms today Strength training completed today  Goals Unmet:  Not Applicable  Comments: Pt able to follow exercise prescription today without complaint.  Will continue to monitor for progression.    Dr. Bethann Punches is Medical Director for [facility] Cardiac Rehabilitation.  Dr. Vida Rigger is Medical Director for [facility] Pulmonary Rehabilitation.

## 2022-08-20 ENCOUNTER — Encounter: Payer: PPO | Admitting: *Deleted

## 2022-08-24 NOTE — Telephone Encounter (Signed)
ONO on RA 08/07/22 showed significant desat <88% for 8h He needs to continue on oxygen during sleep

## 2022-08-24 NOTE — Telephone Encounter (Signed)
ATC patient. Per DPR, left detailed vm letting pt know his results of the ONO and that he needs to stay on oxygen during his sleep.   Nothing further needed.

## 2022-08-25 ENCOUNTER — Ambulatory Visit: Payer: PPO

## 2022-08-31 DIAGNOSIS — Z4542 Encounter for adjustment and management of neuropacemaker (brain) (peripheral nerve) (spinal cord): Secondary | ICD-10-CM | POA: Diagnosis not present

## 2022-08-31 DIAGNOSIS — J449 Chronic obstructive pulmonary disease, unspecified: Secondary | ICD-10-CM | POA: Diagnosis not present

## 2022-08-31 DIAGNOSIS — G4734 Idiopathic sleep related nonobstructive alveolar hypoventilation: Secondary | ICD-10-CM | POA: Diagnosis not present

## 2022-08-31 DIAGNOSIS — G4733 Obstructive sleep apnea (adult) (pediatric): Secondary | ICD-10-CM | POA: Diagnosis not present

## 2022-08-31 DIAGNOSIS — F5104 Psychophysiologic insomnia: Secondary | ICD-10-CM | POA: Diagnosis not present

## 2022-08-31 DIAGNOSIS — Z9981 Dependence on supplemental oxygen: Secondary | ICD-10-CM | POA: Diagnosis not present

## 2022-09-02 ENCOUNTER — Encounter: Payer: Self-pay | Admitting: *Deleted

## 2022-09-02 DIAGNOSIS — J42 Unspecified chronic bronchitis: Secondary | ICD-10-CM

## 2022-09-02 NOTE — Progress Notes (Signed)
Discharge Summary  Howard Maxwell  DOB: 09/21/1945  Mr. Monnier informed staff that he would like to be discharged at this time due to health concerns. He does not feel like he can commit to the program. He completed 8 of 36 sessions.     6 Minute Walk     Row Name 06/17/22 1327         6 Minute Walk   Phase Initial     Distance 1005 feet     Walk Time 6 minutes     # of Rest Breaks 0     MPH 1.9     METS 1.86     RPE 11     Perceived Dyspnea  1     VO2 Peak 6.52     Symptoms No     Resting HR 91 bpm     Resting BP 132/84     Resting Oxygen Saturation  89 %     Exercise Oxygen Saturation  during 6 min walk 87 %     Max Ex. HR 106 bpm     Max Ex. BP 130/70     2 Minute Post BP 122/68       Interval HR   1 Minute HR 84     2 Minute HR 100     3 Minute HR 92     4 Minute HR 104     5 Minute HR 106     6 Minute HR 106     2 Minute Post HR 91     Interval Heart Rate? Yes       Interval Oxygen   Interval Oxygen? Yes     Baseline Oxygen Saturation % 89 %     1 Minute Oxygen Saturation % 89 %     1 Minute Liters of Oxygen 0 L     2 Minute Oxygen Saturation % 88 %     2 Minute Liters of Oxygen 0 L     3 Minute Oxygen Saturation % 87 %     3 Minute Liters of Oxygen 0 L     4 Minute Oxygen Saturation % 88 %     4 Minute Liters of Oxygen 0 L     5 Minute Oxygen Saturation % 89 %     5 Minute Liters of Oxygen 0 L     6 Minute Oxygen Saturation % 90 %     6 Minute Liters of Oxygen 0 L     2 Minute Post Oxygen Saturation % 90 %     2 Minute Post Liters of Oxygen 0 L

## 2022-09-02 NOTE — Progress Notes (Signed)
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 to guide intensity level when exercising independently       Knowledge and understanding of Target Heart Rate Range (THRR) Yes       Intervention Provide education and explanation of THRR including how the numbers were predicted and where they are located for reference       Expected Outcomes Short Term: Able to state/look up THRR;Long Term: Able to use THRR to govern intensity when exercising independently;Short Term: Able to use daily as guideline for intensity in rehab       Able to check pulse independently Yes       Intervention Provide education and demonstration on how to check pulse in carotid and radial arteries.;Review the importance of being able to check your own pulse for safety during independent exercise       Expected Outcomes Short Term: Able to explain why pulse checking is  important during independent exercise;Long Term: Able to check pulse independently and accurately       Understanding of Exercise Prescription Yes       Intervention Provide education, explanation, and written materials on patient's individual exercise prescription       Expected Outcomes Short Term: Able to explain program exercise prescription;Long Term: Able to explain home exercise prescription to exercise independently                Exercise Goals Re-Evaluation :  Exercise Goals Re-Evaluation     Row Name 06/23/22 1121 06/24/22 0958 07/06/22 1440 07/20/22 1504 08/03/22 1443     Exercise Goal Re-Evaluation   Exercise Goals Review Increase Physical Activity;Able to understand and use rate of perceived exertion (RPE) scale;Knowledge and understanding of Target Heart Rate Range (THRR);Understanding of Exercise Prescription;Increase Strength and Stamina;Able to check pulse independently;Able to understand and use Dyspnea scale Increase Physical Activity;Increase Strength and Stamina;Understanding of Exercise Prescription Increase Physical Activity;Increase Strength and Stamina;Understanding of Exercise Prescription Increase Physical Activity;Increase Strength and Stamina;Understanding of Exercise Prescription Increase Physical Activity;Increase Strength and Stamina;Understanding of Exercise Prescription   Comments Reviewed RPE scale, THR and program prescription with pt today.  Pt voiced understanding and was given a copy of goals to take home. Karron did well for his first session of rehab. He was not able to tolerate the XR due to knee pain and will make sure he is on a new machine next time to accommodate his MSK needs. He was able to walk 15 laps on the track. His O2 levels stayed 88% and above. We will continue to monitor as he progresses in the program. Tysheem has not attended rehab since his first session on 06/23/2022. We will call patient to see when he would like to return to rehab. Loman  had been out of rehab sessions due to not feeling well and back pain. He returned back and completed 2 more sessions. He was able to  walk 22 laps on the track! He also increased to level 4 on the T4 Nustep. RPEs are appropriate. We will continue to monitor. Isidore is doing well in rehab. He has consistently walked up to 17 laps on the track. He also has worked at level 1 on the arm crank and level 2 on the T4 nustep. He has continued to use 3 lb hand weights for resistance training as well. We will continue to monitor his progress in the program.   Expected Outcomes Short: Use RPE daily to regulate intensity.  Long: Follow program prescription in THR. Short: Continue initial exercise prescription Long: Build up overall strength and stamina Short: Return to rehab. Long: Continue to build up overall strength and stamina. Short: Slowly increase laps on track Long: Continue to increse overall MET level and stamina Short: Slowly increase laps on track. Long: Continue to improve strength and stamina.    Row Name 08/18/22 0841 08/31/22 1729           Exercise Goal Re-Evaluation   Exercise Goals Review Increase Physical Activity;Increase Strength and Stamina;Understanding of Exercise Prescription Increase Physical Activity;Increase Strength and Stamina;Understanding of Exercise Prescription      Comments Taesean is averaging attendance only once a week.  We will encourage improved attendance in order to see real progress.  We will conitnue to montior his progress. Jake has only attend rehab once since the last review. He was able to increase his number of laps on the track up to 31 laps. He also has continued to do well with 3 lb hand weights for resistance training. We will reiterate the importance of regular attendance to see progressive results. We will also conitnue to monitor his progress.      Expected Outcomes Short: Attend rehab consistently Long: Continue to improve stamina Short: Attend rehab  consistently. Long: Continue to improve stamina.               Discharge Exercise Prescription (Final Exercise Prescription Changes):  Exercise Prescription Changes - 08/31/22 1700       Response to Exercise   Blood Pressure (Admit) 134/68    Blood Pressure (Exercise) 126/64    Blood Pressure (Exit) 122/70    Heart Rate (Admit) 66 bpm    Heart Rate (Exercise) 88 bpm    Heart Rate (Exit) 80 bpm    Oxygen Saturation (Admit) 92 %    Oxygen Saturation (Exercise) 91 %    Oxygen Saturation (Exit) 93 %    Rating of Perceived Exertion (Exercise) 13    Perceived Dyspnea (Exercise) 2    Symptoms SOB    Duration Progress to 30 minutes of  aerobic without signs/symptoms of physical distress    Intensity THRR unchanged      Progression   Progression Continue to progress workloads to maintain intensity without signs/symptoms of physical distress.    Average METs 2.6      Resistance Training   Training Prescription Yes    Weight 3 lb    Reps 10-15      Interval Training   Interval Training No      Arm Ergometer   Level 1    Minutes 15    METs 2.5      Track   Laps 31    Minutes 15    METs 2.69      Oxygen   Maintain Oxygen Saturation 88% or higher             Nutrition:  Target  Goals: Understanding of nutrition guidelines, daily intake of sodium 1500mg , cholesterol 200mg , calories 30% from fat and 7% or less from saturated fats, daily to have 5 or more servings of fruits and vegetables.  Education: All About Nutrition: -Group instruction provided by verbal, written material, interactive activities, discussions, models, and posters to present general guidelines for heart healthy nutrition including fat, fiber, MyPlate, the role of sodium in heart healthy nutrition, utilization of the nutrition label, and utilization of this knowledge for meal planning. Follow up email sent as well. Written material given at graduation. Flowsheet Row Pulmonary Rehab from 08/19/2022 in  [facility] Cardiac and Pulmonary Rehab  Date 07/09/22  Alberteen Sam 2]  Educator [facility]  Instruction Review Code 1- Verbalizes Understanding       Biometrics:  Pre Biometrics - 06/17/22 1345       Pre Biometrics   Height 5' 9.25" (1.759 m)    Weight 218 lb 11.2 oz (99.2 kg)    Waist Circumference 51 inches    Hip Circumference 44 inches    Waist to Hip Ratio 1.16 %    BMI (Calculated) 32.06    Single Leg Stand --   not attempted due to concerns about balance             Nutrition Therapy Plan and Nutrition Goals:  Nutrition Therapy & Goals - 06/22/22 1351       Nutrition Therapy   Diet Heart healthy, low Na, T2DM MNT, Pulmonary MNT    Drug/Food Interactions Statins/Certain Fruits    Protein (specify units) 120g    Fiber 30 grams    Whole Grain Foods 3 servings    Saturated Fats 16 max. grams    Fruits and Vegetables 8 servings/day    Sodium 1.5 grams      Personal Nutrition Goals   Nutrition Goal ST: swap out the pork for another protein source some of the time - chicken, fish, beans/lentils, greek yogurt, etc.  LT: continue with changes made, maintain A1C <7, limit red meat <1-2x/week, limit Na <1.5g/day    Comments 77 y.o. M admitted to pulmonary rehab for chronic bronchitis. PMHx includes CAD, OSA, COPD, T2DM, DDD. Medications includes lipitor, calcium citrate-vitamin D, vitamin D3, CoQ10, jardiance, fenugreek, glipizide, magnesium, metformin, MVI with minerals, fish oil, omperazole, potassium, trazodone, tumeric, b12, vitmain C. A1C was 7% 06/02/22. B: 2 sausage biscuits (homemade country sausage) or "Daniel Plan" chia seed smoothie (with spinach and vanilla greek yogurt) for breakfast. L: sometimes nothing pulled pork sandwich (he will eat 100% whole wheat) and occassionally fried bolonga with cheese D: He eats mostly plant foods (beans, asparagus, broccolli, salads, spinach), vegetable soup, pork, chicken, and seafood. Albert uses olive oil and sometimes butter, Summit reports using  salt, but uses more pepper. He reports making changes last year and feels he is doing well. Drinks: unsweet tea, crystal lite, and unsweet green tea. Discussed general heart healthy eating and T2DM MNT.      Intervention Plan   Intervention Prescribe, educate and counsel regarding individualized specific dietary modifications aiming towards targeted core components such as weight, hypertension, lipid management, diabetes, heart failure and other comorbidities.    Expected Outcomes Short Term Goal: A plan has been developed with personal nutrition goals set during dietitian appointment.;Short Term Goal: Understand basic principles of dietary content, such as calories, fat, sodium, cholesterol and nutrients.;Long Term Goal: Adherence to prescribed nutrition plan.             Nutrition Assessments:  MEDIFICTS Score  Key: ?70 Need to make dietary changes  40-70 Heart Healthy Diet ? 40 Therapeutic Level Cholesterol Diet  Flowsheet Row Pulmonary Rehab from 06/17/2022 in [facility] Cardiac and Pulmonary Rehab  Picture Your Plate Total Score on Admission 61      Picture Your Plate Scores: <16 Unhealthy dietary pattern with much room for improvement. 41-50 Dietary pattern unlikely to meet recommendations for good health and room for improvement. 51-60 More healthful dietary pattern, with some room for improvement.  >60 Healthy dietary pattern, although there may be some specific behaviors that could be improved.   Nutrition Goals Re-Evaluation:   Nutrition Goals Discharge (Final Nutrition Goals Re-Evaluation):   Psychosocial: Target Goals: Acknowledge presence or absence of significant depression and/or stress, maximize coping skills, provide positive support system. Participant is able to verbalize types and ability to use techniques and skills needed for reducing stress and depression.   Education: Stress, Anxiety, and Depression - Group verbal and visual presentation to define topics  covered.  Reviews how body is impacted by stress, anxiety, and depression.  Also discusses healthy ways to reduce stress and to treat/manage anxiety and depression.  Written material given at graduation.   Education: Sleep Hygiene -Provides group verbal and written instruction about how sleep can affect your health.  Define sleep hygiene, discuss sleep cycles and impact of sleep habits. Review good sleep hygiene tips.    Initial Review & Psychosocial Screening:  Initial Psych Review & Screening - 06/10/22 1348       Initial Review   Current issues with Current Stress Concerns    Source of Stress Concerns Chronic Illness    Comments Collie states that he does not sleep well but since he has been on Trazedone he has been doing well. He has alot of back pain and wants to do more exercise to help with his shortness of breath.      Family Dynamics   Good Support System? Yes    Comments Hovanes has his wife, kids and church group to look to for support.      Barriers   Psychosocial barriers to participate in program The patient should benefit from training in stress management and relaxation.      Screening Interventions   Interventions Encouraged to exercise;To provide support and resources with identified psychosocial needs;Provide feedback about the scores to participant    Expected Outcomes Short Term goal: Utilizing psychosocial counselor, staff and physician to assist with identification of specific Stressors or current issues interfering with healing process. Setting desired goal for each stressor or current issue identified.;Long Term Goal: Stressors or current issues are controlled or eliminated.;Short Term goal: Identification and review with participant of any Quality of Life or Depression concerns found by scoring the questionnaire.;Long Term goal: The participant improves quality of Life and PHQ9 Scores as seen by post scores and/or verbalization of changes             Quality of  Life Scores:  Scores of 19 and below usually indicate a poorer quality of life in these areas.  A difference of  2-3 points is a clinically meaningful difference.  A difference of 2-3 points in the total score of the Quality of Life Index has been associated with significant improvement in overall quality of life, self-image, physical symptoms, and general health in studies assessing change in quality of life.  PHQ-9: Review Flowsheet  More data exists      06/17/2022 06/10/2022 07/01/2021 02/19/2021 11/20/2020  Depression screen PHQ 2/9  Decreased Interest 0 0 0 0 0  Down, Depressed, Hopeless 0 0 0 0 0  PHQ - 2 Score 0 0 0 0 0  Altered sleeping 1 - - - -  Tired, decreased energy 1 - - - -  Change in appetite 0 - - - -  Feeling bad or failure about yourself  0 - - - -  Trouble concentrating 0 - - - -  Moving slowly or fidgety/restless 0 - - - -  Suicidal thoughts 0 - - - -  PHQ-9 Score 2 - - - -  Difficult doing work/chores Not difficult at all - - - -   Interpretation of Total Score  Total Score Depression Severity:  1-4 = Minimal depression, 5-9 = Mild depression, 10-14 = Moderate depression, 15-19 = Moderately severe depression, 20-27 = Severe depression   Psychosocial Evaluation and Intervention:  Psychosocial Evaluation - 06/10/22 1353       Psychosocial Evaluation & Interventions   Interventions Relaxation education;Encouraged to exercise with the program and follow exercise prescription;Stress management education    Comments Nakye states that he does not sleep well but since he has been on Trazedone he has been doing well. He has alot of back pain and wants to do more exercise to help with his shortness of breath.Roxas has his wife, kids and church group to look to for support.    Expected Outcomes Short: Start HeartTrack to help with mood. Long: Maintain a healthy mental state    Continue Psychosocial Services  Follow up required by staff             Psychosocial  Re-Evaluation:   Psychosocial Discharge (Final Psychosocial Re-Evaluation):   Education: Education Goals: Education classes will be provided on a weekly basis, covering required topics. Participant will state understanding/return demonstration of topics presented.  Learning Barriers/Preferences:  Learning Barriers/Preferences - 06/10/22 1347       Learning Barriers/Preferences   Learning Barriers Sight    Learning Preferences None             General Pulmonary Education Topics:  Infection Prevention: - Provides verbal and written material to individual with discussion of infection control including proper hand washing and proper equipment cleaning during exercise session. Flowsheet Row Pulmonary Rehab from 08/19/2022 in [facility] Cardiac and Pulmonary Rehab  Date 06/17/22  Educator [facility]  Instruction Review Code 1- Verbalizes Understanding       Falls Prevention: - Provides verbal and written material to individual with discussion of falls prevention and safety. Flowsheet Row Pulmonary Rehab from 08/19/2022 in [facility] Cardiac and Pulmonary Rehab  Date 06/17/22  Educator [facility]  Instruction Review Code 1- Verbalizes Understanding       Chronic Lung Disease Review: - Group verbal instruction with posters, models, PowerPoint presentations and videos,  to review new updates, new respiratory medications, new advancements in procedures and treatments. Providing information on websites and "800" numbers for continued self-education. Includes information about supplement oxygen, available portable oxygen systems, continuous and intermittent flow rates, oxygen safety, concentrators, and Medicare reimbursement for oxygen. Explanation of Pulmonary Drugs, including class, frequency, complications, importance of spacers, rinsing mouth after steroid MDI's, and proper cleaning methods for nebulizers. Review of basic lung anatomy and physiology related to function, structure, and complications of lung  disease. Review of risk factors. Discussion about methods for diagnosing sleep apnea and types of masks and machines for OSA. Includes a review of the use of types of environmental controls: home humidity, furnaces, filters, dust mite/pet  prevention, HEPA vacuums. Discussion about weather changes, air quality and the benefits of nasal washing. Instruction on Warning signs, infection symptoms, calling MD promptly, preventive modes, and value of vaccinations. Review of effective airway clearance, coughing and/or vibration techniques. Emphasizing that all should Create an Action Plan. Written material given at graduation. Flowsheet Row Pulmonary Rehab from 08/19/2022 in [facility] Cardiac and Pulmonary Rehab  Education need identified 06/17/22       AED/CPR: - Group verbal and written instruction with the use of models to demonstrate the basic use of the AED with the basic ABC's of resuscitation.    Anatomy and Cardiac Procedures: - Group verbal and visual presentation and models provide information about basic cardiac anatomy and function. Reviews the testing methods done to diagnose heart disease and the outcomes of the test results. Describes the treatment choices: Medical Management, Angioplasty, or Coronary Bypass Surgery for treating various heart conditions including Myocardial Infarction, Angina, Valve Disease, and Cardiac Arrhythmias.  Written material given at graduation. Flowsheet Row Pulmonary Rehab from 08/19/2022 in [facility] Cardiac and Pulmonary Rehab  Date 07/16/22  Educator SB  Instruction Review Code 1- Verbalizes Understanding       Medication Safety: - Group verbal and visual instruction to review commonly prescribed medications for heart and lung disease. Reviews the medication, class of the drug, and side effects. Includes the steps to properly store meds and maintain the prescription regimen.  Written material given at graduation. Flowsheet Row Pulmonary Rehab from 08/19/2022 in [facility]  Cardiac and Pulmonary Rehab  Date 07/23/22  Educator SB  Instruction Review Code 1- Verbalizes Understanding       Other: -Provides group and verbal instruction on various topics (see comments)   Knowledge Questionnaire Score:  Knowledge Questionnaire Score - 06/17/22 1347       Knowledge Questionnaire Score   Pre Score 17/18              Core Components/Risk Factors/Patient Goals at Admission:  Personal Goals and Risk Factors at Admission - 06/17/22 1348       Core Components/Risk Factors/Patient Goals on Admission    Weight Management Yes;Weight Loss    Intervention Weight Management: Develop a combined nutrition and exercise program designed to reach desired caloric intake, while maintaining appropriate intake of nutrient and fiber, sodium and fats, and appropriate energy expenditure required for the weight goal.;Weight Management: Provide education and appropriate resources to help participant work on and attain dietary goals.;Weight Management/Obesity: Establish reasonable short term and long term weight goals.    Admit Weight 218 lb 11.2 oz (99.2 kg)    Goal Weight: Short Term 210 lb (95.3 kg)    Goal Weight: Long Term 190 lb (86.2 kg)    Expected Outcomes Short Term: Continue to assess and modify interventions until short term weight is achieved;Long Term: Adherence to nutrition and physical activity/exercise program aimed toward attainment of established weight goal;Weight Loss: Understanding of general recommendations for a balanced deficit meal plan, which promotes 1-2 lb weight loss per week and includes a negative energy balance of [phone] kcal/d;Understanding recommendations for meals to include 15-35% energy as protein, 25-35% energy from fat, 35-60% energy from carbohydrates, less than 200mg  of dietary cholesterol, 20-35 gm of total fiber daily;Understanding of distribution of calorie intake throughout the day with the consumption of 4-5 meals/snacks    Improve  shortness of breath with ADL's Yes    Intervention Provide education, individualized exercise plan and daily activity instruction to help decrease symptoms of SOB with activities  of daily living.    Expected Outcomes Short Term: Improve cardiorespiratory fitness to achieve a reduction of symptoms when performing ADLs;Long Term: Be able to perform more ADLs without symptoms or delay the onset of symptoms    Diabetes Yes    Intervention Provide education about signs/symptoms and action to take for hypo/hyperglycemia.;Provide education about proper nutrition, including hydration, and aerobic/resistive exercise prescription along with prescribed medications to achieve blood glucose in normal ranges: Fasting glucose 65-99 mg/dL    Expected Outcomes Long Term: Attainment of HbA1C < 7%.;Short Term: Participant verbalizes understanding of the signs/symptoms and immediate care of hyper/hypoglycemia, proper foot care and importance of medication, aerobic/resistive exercise and nutrition plan for blood glucose control.    Hypertension Yes    Intervention Provide education on lifestyle modifcations including regular physical activity/exercise, weight management, moderate sodium restriction and increased consumption of fresh fruit, vegetables, and low fat dairy, alcohol moderation, and smoking cessation.;Monitor prescription use compliance.    Expected Outcomes Short Term: Continued assessment and intervention until BP is < 140/69mm HG in hypertensive participants. < 130/50mm HG in hypertensive participants with diabetes, heart failure or chronic kidney disease.;Long Term: Maintenance of blood pressure at goal levels.    Lipids Yes    Intervention Provide education and support for participant on nutrition & aerobic/resistive exercise along with prescribed medications to achieve LDL 70mg , HDL >40mg .    Expected Outcomes Short Term: Participant states understanding of desired cholesterol values and is compliant with  medications prescribed. Participant is following exercise prescription and nutrition guidelines.;Long Term: Cholesterol controlled with medications as prescribed, with individualized exercise RX and with personalized nutrition plan. Value goals: LDL < 70mg , HDL > 40 mg.             Education:Diabetes - Individual verbal and written instruction to review signs/symptoms of diabetes, desired ranges of glucose level fasting, after meals and with exercise. Acknowledge that pre and post exercise glucose checks will be done for 3 sessions at entry of program. Flowsheet Row Pulmonary Rehab from 08/19/2022 in [facility] Cardiac and Pulmonary Rehab  Date 06/17/22  Educator [facility]  Instruction Review Code 1- Verbalizes Understanding       Know Your Numbers and Heart Failure: - Group verbal and visual instruction to discuss disease risk factors for cardiac and pulmonary disease and treatment options.  Reviews associated critical values for Overweight/Obesity, Hypertension, Cholesterol, and Diabetes.  Discusses basics of heart failure: signs/symptoms and treatments.  Introduces Heart Failure Zone chart for action plan for heart failure.  Written material given at graduation.   Core Components/Risk Factors/Patient Goals Review:    Core Components/Risk Factors/Patient Goals at Discharge (Final Review):    ITP Comments:  ITP Comments     Row Name 06/10/22 1345 06/17/22 1326 06/22/22 1524 06/23/22 1120 07/08/22 1133   ITP Comments Virtual Visit completed. Patient informed on EP and RD appointment and 6 Minute walk test. Patient also informed of patient health questionnaires on My Chart. Patient Verbalizes understanding. Visit diagnosis can be found in [facility] 06/01/2022. Completed and gym orientation. Initial ITP created and sent for review to Dr. Jinny Sanders, Medical Director. Completed initial RD consultation First full day of exercise!  Patient was oriented to gym and equipment including functions,  settings, policies, and procedures.  Patient's individual exercise prescription and treatment plan were reviewed.  All starting workloads were established based on the results of the 6 minute walk test done at initial orientation visit.  The plan for exercise progression was also introduced  and progression will be customized based on patient's performance and goals.' Left message for patient for pulmonary rehab. Has not attended since 4/2.    Row Name 07/08/22 1333 08/04/22 0904 08/05/22 0921 08/27/22 1349 09/02/22 1006   ITP Comments 30 day review completed. ITP sent to Dr. Jinny Sanders, Medical Director of  Pulmonary Rehab. Continue with ITP unless changes are made by physician. Clydie has not attended since 07/23/22.  Nikko has called out last week and this week has appt conflicts.  Thus due to lack of attendance we have not been able to assess for goals. 30 Day review completed. Medical Director ITP review done, changes made as directed, and signed approval by Medical Director.   absent since 5/2 Reade has been in and out due to appointments and being sick.  This week he is out on vacation.  Thus we have not been able to assess goals this round. Glenden informed staff that he would like to be discharged at this time due to health concerns.            Comments: Discharge ITP  9/36 Sessions

## 2022-09-03 ENCOUNTER — Telehealth (HOSPITAL_BASED_OUTPATIENT_CLINIC_OR_DEPARTMENT_OTHER): Payer: Self-pay | Admitting: Pulmonary Disease

## 2022-09-03 NOTE — Telephone Encounter (Signed)
Created in error. Disregard.

## 2022-09-04 ENCOUNTER — Telehealth: Payer: Self-pay | Admitting: Pulmonary Disease

## 2022-09-04 DIAGNOSIS — G4733 Obstructive sleep apnea (adult) (pediatric): Secondary | ICD-10-CM

## 2022-09-04 NOTE — Telephone Encounter (Signed)
Pt states [employer] fax'd in a request that needs to be signed by Dr. Vassie Loll. He would like it to be sent in today if possible. Please call PT to advise action taken.   He can not use his O2 @ night until this is filled. Sending back high priority.   [phone]

## 2022-09-04 NOTE — Telephone Encounter (Signed)
Called and spoke with patient. Advised pt that Dr. Vassie Loll won't be here in clinic until the 24th and then it could be taken care of then. He verbalized understanding. Leaving encounter open until this is taken care of.

## 2022-09-04 NOTE — Telephone Encounter (Signed)
Pt purchase a POC through [facility] and they needs prescription

## 2022-09-04 NOTE — Telephone Encounter (Signed)
Referral will have to be put in - no order placed.

## 2022-09-08 NOTE — Patient Instructions (Signed)
____________________________________________________________________________________________  Opioid Pain Medication Update  To: All patients taking opioid pain medications. (I.e.: hydrocodone, hydromorphone, oxycodone, oxymorphone, morphine, codeine, methadone, tapentadol, tramadol, buprenorphine, fentanyl, etc.)  Re: Updated review of side effects and adverse reactions of opioid analgesics, as well as new information about long term effects of this class of medications.  Direct risks of long-term opioid therapy are not limited to opioid addiction and overdose. Potential medical risks include serious fractures, breathing problems during sleep, hyperalgesia, immunosuppression, chronic constipation, bowel obstruction, myocardial infarction, and tooth decay secondary to xerostomia.  Unpredictable adverse effects that can occur even if you take your medication correctly: Cognitive impairment, respiratory depression, and death. Most people think that if they take their medication "correctly", and "as instructed", that they will be safe. Nothing could be farther from the truth. In reality, a significant amount of recorded deaths associated with the use of opioids has occurred in individuals that had taken the medication for a long time, and were taking their medication correctly. The following are examples of how this can happen: Patient taking his/her medication for a long time, as instructed, without any side effects, is given a certain antibiotic or another unrelated medication, which in turn triggers a "Drug-to-drug interaction" leading to disorientation, cognitive impairment, impaired reflexes, respiratory depression or an untoward event leading to serious bodily harm or injury, including death.  Patient taking his/her medication for a long time, as instructed, without any side effects, develops an acute impairment of liver and/or kidney function. This will lead to a rapid inability of the body to  breakdown and eliminate their pain medication, which will result in effects similar to an "overdose", but with the same medicine and dose that they had always taken. This again may lead to disorientation, cognitive impairment, impaired reflexes, respiratory depression or an untoward event leading to serious bodily harm or injury, including death.  A similar problem will occur with patients as they grow older and their liver and kidney function begins to decrease as part of the aging process.  Background information: Historically, the original case for using long-term opioid therapy to treat chronic noncancer pain was based on safety assumptions that subsequent experience has called into question. In 1996, the American Pain Society and the American Academy of Pain Medicine issued a consensus statement supporting long-term opioid therapy. This statement acknowledged the dangers of opioid prescribing but concluded that the risk for addiction was low; respiratory depression induced by opioids was short-lived, occurred mainly in opioid-naive patients, and was antagonized by pain; tolerance was not a common problem; and efforts to control diversion should not constrain opioid prescribing. This has now proven to be wrong. Experience regarding the risks for opioid addiction, misuse, and overdose in community practice has failed to support these assumptions.  According to the Centers for Disease Control and Prevention, fatal overdoses involving opioid analgesics have increased sharply over the past decade. Currently, more than 96,700 people die from drug overdoses every year. Opioids are a factor in 7 out of every 10 overdose deaths. Deaths from drug overdose have surpassed motor vehicle accidents as the leading cause of death for individuals between the ages of 35 and 54.  Clinical data suggest that neuroendocrine dysfunction may be very common in both men and women, potentially causing hypogonadism, erectile  dysfunction, infertility, decreased libido, osteoporosis, and depression. Recent studies linked higher opioid dose to increased opioid-related mortality. Controlled observational studies reported that long-term opioid therapy may be associated with increased risk for cardiovascular events. Subsequent meta-analysis concluded   that the safety of long-term opioid therapy in elderly patients has not been proven.   Side Effects and adverse reactions: Common side effects: Drowsiness (sedation). Dizziness. Nausea and vomiting. Constipation. Physical dependence -- Dependence often manifests with withdrawal symptoms when opioids are discontinued or decreased. Tolerance -- As you take repeated doses of opioids, you require increased medication to experience the same effect of pain relief. Respiratory depression -- This can occur in healthy people, especially with higher doses. However, people with COPD, asthma or other lung conditions may be even more susceptible to fatal respiratory impairment.  Uncommon side effects: An increased sensitivity to feeling pain and extreme response to pain (hyperalgesia). Chronic use of opioids can lead to this. Delayed gastric emptying (the process by which the contents of your stomach are moved into your small intestine). Muscle rigidity. Immune system and hormonal dysfunction. Quick, involuntary muscle jerks (myoclonus). Arrhythmia. Itchy skin (pruritus). Dry mouth (xerostomia).  Long-term side effects: Chronic constipation. Sleep-disordered breathing (SDB). Increased risk of bone fractures. Hypothalamic-pituitary-adrenal dysregulation. Increased risk of overdose.  RISKS: Fractures and Falls:  Opioids increase the risk and incidence of falls. This is of particular importance in elderly patients.  Endocrine System:  Long-term administration is associated with endocrine abnormalities (endocrinopathies). (Also known as Opioid-induced Endocrinopathy) Influences  on both the hypothalamic-pituitary-adrenal axis?and the hypothalamic-pituitary-gonadal axis have been demonstrated with consequent hypogonadism and adrenal insufficiency in both sexes. Hypogonadism and decreased levels of dehydroepiandrosterone sulfate have been reported in men and women. Endocrine effects include: Amenorrhoea in women (abnormal absence of menstruation) Reduced libido in both sexes Decreased sexual function Erectile dysfunction in men Hypogonadisms (decreased testicular function with shrinkage of testicles) Infertility Depression and fatigue Loss of muscle mass Anxiety Depression Immune suppression Hyperalgesia Weight gain Anemia Osteoporosis Patients (particularly women of childbearing age) should avoid opioids. There is insufficient evidence to recommend routine monitoring of asymptomatic patients taking opioids in the long-term for hormonal deficiencies.  Immune System: Human studies have demonstrated that opioids have an immunomodulating effect. These effects are mediated via opioid receptors both on immune effector cells and in the central nervous system. Opioids have been demonstrated to have adverse effects on antimicrobial response and anti-tumour surveillance. Buprenorphine has been demonstrated to have no impact on immune function.  Opioid Induced Hyperalgesia: Human studies have demonstrated that prolonged use of opioids can lead to a state of abnormal pain sensitivity, sometimes called opioid induced hyperalgesia (OIH). Opioid induced hyperalgesia is not usually seen in the absence of tolerance to opioid analgesia. Clinically, hyperalgesia may be diagnosed if the patient on long-term opioid therapy presents with increased pain. This might be qualitatively and anatomically distinct from pain related to disease progression or to breakthrough pain resulting from development of opioid tolerance. Pain associated with hyperalgesia tends to be more diffuse than the  pre-existing pain and less defined in quality. Management of opioid induced hyperalgesia requires opioid dose reduction.  Cancer: Chronic opioid therapy has been associated with an increased risk of cancer among noncancer patients with chronic pain. This association was more evident in chronic strong opioid users. Chronic opioid consumption causes significant pathological changes in the small intestine and colon. Epidemiological studies have found that there is a link between opium dependence and initiation of gastrointestinal cancers. Cancer is the second leading cause of death after cardiovascular disease. Chronic use of opioids can cause multiple conditions such as GERD, immunosuppression and renal damage as well as carcinogenic effects, which are associated with the incidence of cancers.   Mortality: Long-term opioid use   has been associated with increased mortality among patients with chronic non-cancer pain (CNCP).  Prescription of long-acting opioids for chronic noncancer pain was associated with a significantly increased risk of all-cause mortality, including deaths from causes other than overdose.  Reference: Von Korff M, Kolodny A, Deyo RA, Chou R. Long-term opioid therapy reconsidered. Ann Intern Med. 2011 Sep 6;155(5):325-8. doi: 10.7326/[id number]. PMID: 21893626; PMCID: PMC3280085. Bedson J, Chen Y, Ashworth J, Hayward RA, Dunn KM, Jordan KP. Risk of adverse events in patients prescribed long-term opioids: A cohort study in the [place] Clinical Practice Research Datalink. Eur J Pain. 2019 May;23(5):908-922. doi: 10.1002/ejp.1357. Epub 2019 Jan 31. PMID: 30620116. Colameco S, Coren JS, Ciervo CA. Continuous opioid treatment for chronic noncancer pain: a time for moderation in prescribing. Postgrad Med. 2009 Jul;121(4):61-6. doi: 10.3810/pgm.2009.07.2032. PMID: 19641271. Chou R, Turner JA, Devine EB, Hansen RN, Sullivan SD, Blazina I, Dana T, Bougatsos C, Deyo RA. The  effectiveness and risks of long-term opioid therapy for chronic pain: a systematic review for a National Institutes of Health Pathways to Prevention Workshop. Ann Intern Med. 2015 Feb 17;162(4):276-86. doi: 10.7326/M14-2559. PMID: 25581257. Warner M, Chen LH, Makuc DM. NCHS Data Brief No. 22. [place]: Centers for Disease Control and Prevention; 2009. Sep, Increase in Fatal Poisonings Involving Opioid Analgesics in the [place], 1999-2006. Song IA, Choi HR, Oh TK. Long-term opioid use and mortality in patients with chronic non-cancer pain: Ten-year follow-up study in [place] from 2010 through 2019. EClinicalMedicine. 2022 Jul 18;51:101558. doi: 10.1016/j.eclinm.2022.101558. PMID: 35875817; PMCID: PMC9304910. Huser, W., Schubert, T., Vogelmann, T. et al. All-cause mortality in patients with long-term opioid therapy compared with non-opioid analgesics for chronic non-cancer pain: a database study. BMC Med 18, 162 (2020). https://doi.org/10.1186/[email or web address] Rashidian H, Zendehdel K, Kamangar F, Malekzadeh R, Haghdoost AA. An Ecological Study of the Association between Opiate Use and Incidence of Cancers. Addict Health. 2016 Fall;8(4):252-260. PMID: 28819556; PMCID: PMC5554805.  Our Goal: Our goal is to control your pain with means other than the use of opioid pain medications.  Our Recommendation: Talk to your physician about coming off of these medications. We can assist you with the tapering down and stopping these medicines. Based on the new information, even if you cannot completely stop the medication, a decrease in the dose may be associated with a lesser risk. Ask for other means of controlling the pain. Decrease or eliminate those factors that significantly contribute to your pain such as smoking, obesity, and a diet heavily tilted towards "inflammatory" nutrients.  Last Updated: 05/20/2022    ____________________________________________________________________________________________     ____________________________________________________________________________________________  Transfer of Pain Medication between Pharmacies  Re: 2023 DEA Clarification on existing regulation  Published on DEA Website: November 21, 2021  Title: Revised Regulation Allows DEA-Registered Pharmacies to Transfer Electronic Prescriptions at a Patient's Request DEA Headquarters Division - Public Information Office  "Patients now have the ability to request their electronic prescription be transferred to another pharmacy without having to go back to their practitioner to initiate the request. This revised regulation went into effect on Monday, November 17, 2021.     At a patient's request, a DEA-registered retail pharmacy can now transfer an electronic prescription for a controlled substance (schedules II-V) to another DEA-registered retail pharmacy. Prior to this change, patients would have to go through their practitioner to cancel their prescription and have it re-issued to a different pharmacy. The process was taxing and time consuming for both patients and practitioners.    The Drug Enforcement Administration (DEA) published its intent to revise the   process for transferring electronic prescriptions on February 09, 2020.  The final rule was published in the federal register on October 16, 2021 and went into effect 30 days later.  Under the final rule, a prescription can only be transferred once between pharmacies, and only if allowed under existing state or other applicable law. The prescription must remain in its electronic form; may not be altered in any way; and the transfer must be communicated directly between two licensed pharmacists. It's important to note, any authorized refills transfer with the original prescription, which means the entire prescription will be filled at the same pharmacy."     REFERENCES: 1. DEA website announcement https://www.dea.gov/stories/2023/2023-11/2021-09-01/revised-regulation-allows-dea-registered-pharmacies-transfer  2. Department of Justice website  https://www.govinfo.gov/content/pkg/FR-2021-10-16/pdf/2023-15847.pdf  3. DEPARTMENT OF JUSTICE Drug Enforcement Administration 21 CFR Part 1306 [Docket No. DEA-637] RIN [id number] "Transfer of Electronic Prescriptions for Schedules II-V Controlled Substances Between Pharmacies for Initial Filling"  ____________________________________________________________________________________________     _______________________________________________________________________  Medication Rules  Purpose: To inform patients, and their family members, of our medication rules and regulations.  Applies to: All patients receiving prescriptions from our practice (written or electronic).  Pharmacy of record: This is the pharmacy where your electronic prescriptions will be sent. Make sure we have the correct one.  Electronic prescriptions: In compliance with the [place] Strengthen Opioid Misuse Prevention (STOP) Act of 2017 (Session Law 2017-74/H243), effective March 23, 2018, all controlled substances must be electronically prescribed. Written prescriptions, faxing, or calling prescriptions to a pharmacy will no longer be done.  Prescription refills: These will be provided only during in-person appointments. No medications will be renewed without a "face-to-face" evaluation with your provider. Applies to all prescriptions.  NOTE: The following applies primarily to controlled substances (Opioid* Pain Medications).   Type of encounter (visit): For patients receiving controlled substances, face-to-face visits are required. (Not an option and not up to the patient.)  Patient's responsibilities: Pain Pills: Bring all pain pills to every appointment (except for procedure appointments). Pill Bottles: Bring  pills in original pharmacy bottle. Bring bottle, even if empty. Always bring the bottle of the most recent fill.  Medication refills: You are responsible for knowing and keeping track of what medications you are taking and when is it that you will need a refill. The day before your appointment: write a list of all prescriptions that need to be refilled. The day of the appointment: give the list to the admitting nurse. Prescriptions will be written only during appointments. No prescriptions will be written on procedure days. If you forget a medication: it will not be "Called in", "Faxed", or "electronically sent". You will need to get another appointment to get these prescribed. No early refills. Do not call asking to have your prescription filled early. Partial  or short prescriptions: Occasionally your pharmacy may not have enough pills to fill your prescription.  NEVER ACCEPT a partial fill or a prescription that is short of the total amount of pills that you were prescribed.  With controlled substances the law allows 72 hours for the pharmacy to complete the prescription.  If the prescription is not completed within 72 hours, the pharmacist will require a new prescription to be written. This means that you will be short on your medicine and we WILL NOT send another prescription to complete your original prescription.  Instead, request the pharmacy to send a carrier to a nearby branch to get enough medication to provide you with your full prescription. Prescription Accuracy: You are responsible for carefully inspecting your   prescriptions before leaving our office. Have the discharge nurse carefully go over each prescription with you, before taking them home. Make sure that your name is accurately spelled, that your address is correct. Check the name and dose of your medication to make sure it is accurate. Check the number of pills, and the written instructions to make sure they are clear and accurate. Make  sure that you are given enough medication to last until your next medication refill appointment. Taking Medication: Take medication as prescribed. When it comes to controlled substances, taking less pills or less frequently than prescribed is permitted and encouraged. Never take more pills than instructed. Never take the medication more frequently than prescribed.  Inform other Doctors: Always inform, all of your healthcare providers, of all the medications you take. Pain Medication from other Providers: You are not allowed to accept any additional pain medication from any other Doctor or Healthcare provider. There are two exceptions to this rule. (see below) In the event that you require additional pain medication, you are responsible for notifying us, as stated below. Cough Medicine: Often these contain an opioid, such as codeine or hydrocodone. Never accept or take cough medicine containing these opioids if you are already taking an opioid* medication. The combination may cause respiratory failure and death. Medication Agreement: You are responsible for carefully reading and following our Medication Agreement. This must be signed before receiving any prescriptions from our practice. Safely store a copy of your signed Agreement. Violations to the Agreement will result in no further prescriptions. (Additional copies of our Medication Agreement are available upon request.) Laws, Rules, & Regulations: All patients are expected to follow all Federal and State Laws, Statutes, Rules, & Regulations. Ignorance of the Laws does not constitute a valid excuse.  Illegal drugs and Controlled Substances: The use of illegal substances (including, but not limited to marijuana and its derivatives) and/or the illegal use of any controlled substances is strictly prohibited. Violation of this rule may result in the immediate and permanent discontinuation of any and all prescriptions being written by our practice. The use of  any illegal substances is prohibited. Adopted CDC guidelines & recommendations: Target dosing levels will be at or below 60 MME/day. Use of benzodiazepines** is not recommended.  Exceptions: There are only two exceptions to the rule of not receiving pain medications from other Healthcare Providers. Exception #1 (Emergencies): In the event of an emergency (i.e.: accident requiring emergency care), you are allowed to receive additional pain medication. However, you are responsible for: As soon as you are able, call our office [phone], at any time of the day or night, and leave a message stating your name, the date and nature of the emergency, and the name and dose of the medication prescribed. In the event that your call is answered by a member of our staff, make sure to document and save the date, time, and the name of the person that took your information.  Exception #2 (Planned Surgery): In the event that you are scheduled by another doctor or dentist to have any type of surgery or procedure, you are allowed (for a period no longer than 30 days), to receive additional pain medication, for the acute post-op pain. However, in this case, you are responsible for picking up a copy of our "Post-op Pain Management for Surgeons" handout, and giving it to your surgeon or dentist. This document is available at our office, and does not require an appointment to obtain it. Simply go to   our office during business hours (Monday-Thursday from 8:00 AM to 4:00 PM) (Friday 8:00 AM to 12:00 Noon) or if you have a scheduled appointment with us, prior to your surgery, and ask for it by name. In addition, you are responsible for: calling our office [phone], at any time of the day or night, and leaving a message stating your name, name of your surgeon, type of surgery, and date of procedure or surgery. Failure to comply with your responsibilities may result in termination of therapy involving the controlled  substances. Medication Agreement Violation. Following the above rules, including your responsibilities will help you in avoiding a Medication Agreement Violation ("Breaking your Pain Medication Contract").  Consequences:  Not following the above rules may result in permanent discontinuation of medication prescription therapy.  *Opioid medications include: morphine, codeine, oxycodone, oxymorphone, hydrocodone, hydromorphone, meperidine, tramadol, tapentadol, buprenorphine, fentanyl, methadone. **Benzodiazepine medications include: diazepam (Valium), alprazolam (Xanax), clonazepam (Klonopine), lorazepam (Ativan), clorazepate (Tranxene), chlordiazepoxide (Librium), estazolam (Prosom), oxazepam (Serax), temazepam (Restoril), triazolam (Halcion) (Last updated: 01/13/2022) ______________________________________________________________________    ______________________________________________________________________  Medication Recommendations and Reminders  Applies to: All patients receiving prescriptions (written and/or electronic).  Medication Rules & Regulations: You are responsible for reading, knowing, and following our "Medication Rules" document. These exist for your safety and that of others. They are not flexible and neither are we. Dismissing or ignoring them is an act of "non-compliance" that may result in complete and irreversible termination of such medication therapy. For safety reasons, "non-compliance" will not be tolerated. As with the [place] fundamental legal principle of "ignorance of the law is no defense", we will accept no excuses for not having read and knowing the content of documents provided to you by our practice.  Pharmacy of record:  Definition: This is the pharmacy where your electronic prescriptions will be sent.  We do not endorse any particular pharmacy. It is up to you and your insurance to decide what pharmacy to use.  We do not restrict you in your choice of  pharmacy. However, once we write for your prescriptions, we will NOT be re-sending more prescriptions to fix restricted supply problems created by your pharmacy, or your insurance.  The pharmacy listed in the electronic medical record should be the one where you want electronic prescriptions to be sent. If you choose to change pharmacy, simply notify our nursing staff. Changes will be made only during your regular appointments and not over the phone.  Recommendations: Keep all of your pain medications in a safe place, under lock and key, even if you live alone. We will NOT replace lost, stolen, or damaged medication. We do not accept "Police Reports" as proof of medications having been stolen. After you fill your prescription, take 1 week's worth of pills and put them away in a safe place. You should keep a separate, properly labeled bottle for this purpose. The remainder should be kept in the original bottle. Use this as your primary supply, until it runs out. Once it's gone, then you know that you have 1 week's worth of medicine, and it is time to come in for a prescription refill. If you do this correctly, it is unlikely that you will ever run out of medicine. To make sure that the above recommendation works, it is very important that you make sure your medication refill appointments are scheduled at least 1 week before you run out of medicine. To do this in an effective manner, make sure that you do not leave the office without   scheduling your next medication management appointment. Always ask the nursing staff to show you in your prescription , when your medication will be running out. Then arrange for the receptionist to get you a return appointment, at least 7 days before you run out of medicine. Do not wait until you have 1 or 2 pills left, to come in. This is very poor planning and does not take into consideration that we may need to cancel appointments due to bad weather, sickness, or emergencies  affecting our staff. DO NOT ACCEPT A "Partial Fill": If for any reason your pharmacy does not have enough pills/tablets to completely fill or refill your prescription, do not allow for a "partial fill". The law allows the pharmacy to complete that prescription within 72 hours, without requiring a new prescription. If they do not fill the rest of your prescription within those 72 hours, you will need a separate prescription to fill the remaining amount, which we will NOT provide. If the reason for the partial fill is your insurance, you will need to talk to the pharmacist about payment alternatives for the remaining tablets, but again, DO NOT ACCEPT A PARTIAL FILL, unless you can trust your pharmacist to obtain the remainder of the pills within 72 hours.  Prescription refills and/or changes in medication(s):  Prescription refills, and/or changes in dose or medication, will be conducted only during scheduled medication management appointments. (Applies to both, written and electronic prescriptions.) No refills on procedure days. No medication will be changed or started on procedure days. No changes, adjustments, and/or refills will be conducted on a procedure day. Doing so will interfere with the diagnostic portion of the procedure. No phone refills. No medications will be "called into the pharmacy". No Fax refills. No weekend refills. No Holliday refills. No after hours refills.  Remember:  Business hours are:  Monday to Thursday 8:00 AM to 4:00 PM Provider's Schedule: Cordarius Benning, MD - Appointments are:  Medication management: Monday and Wednesday 8:00 AM to 4:00 PM Procedure day: Tuesday and Thursday 7:30 AM to 4:00 PM Bilal Lateef, MD - Appointments are:  Medication management: Tuesday and Thursday 8:00 AM to 4:00 PM Procedure day: Monday and Wednesday 7:30 AM to 4:00 PM (Last update: 01/13/2022) ______________________________________________________________________    ____________________________________________________________________________________________  Naloxone Nasal Spray  Why am I receiving this medication? [place] STOP ACT requires that all patients taking high dose opioids or at risk of opioids respiratory depression, be prescribed an opioid reversal agent, such as Naloxone (AKA: Narcan).  What is this medication? NALOXONE (nal OX one) treats opioid overdose, which causes slow or shallow breathing, severe drowsiness, or trouble staying awake. Call emergency services after using this medication. You may need additional treatment. Naloxone works by reversing the effects of opioids. It belongs to a group of medications called opioid blockers.  COMMON BRAND NAME(S): Kloxxado, Narcan  What should I tell my care team before I take this medication? They need to know if you have any of these conditions: Heart disease Substance use disorder An unusual or allergic reaction to naloxone, other medications, foods, dyes, or preservatives Pregnant or trying to get pregnant Breast-feeding  When to use this medication? This medication is to be used for the treatment of respiratory depression (less than 8 breaths per minute) secondary to opioid overdose.   How to use this medication? This medication is for use in the nose. Lay the person on their back. Support their neck with your hand and allow the head to tilt   back before giving the medication. The nasal spray should be given into 1 nostril. After giving the medication, move the person onto their side. Do not remove or test the nasal spray until ready to use. Get emergency medical help right away after giving the first dose of this medication, even if the person wakes up. You should be familiar with how to recognize the signs and symptoms of a narcotic overdose. If more doses are needed, give the additional dose in the other nostril. Talk to your care team about the use of this medication in children.  While this medication may be prescribed for children as young as newborns for selected conditions, precautions do apply.  Naloxone Overdosage: If you think you have taken too much of this medicine contact a poison control center or emergency room at once.  NOTE: This medicine is only for you. Do not share this medicine with others.  What if I miss a dose? This does not apply.  What may interact with this medication? This is only used during an emergency. No interactions are expected during emergency use. This list may not describe all possible interactions. Give your health care provider a list of all the medicines, herbs, non-prescription drugs, or dietary supplements you use. Also tell them if you smoke, drink alcohol, or use illegal drugs. Some items may interact with your medicine.  What should I watch for while using this medication? Keep this medication ready for use in the case of an opioid overdose. Make sure that you have the phone number of your care team and local hospital ready. You may need to have additional doses of this medication. Each nasal spray contains a single dose. Some emergencies may require additional doses. After use, bring the treated person to the nearest hospital or call 911. Make sure the treating care team knows that the person has received a dose of this medication. You will receive additional instructions on what to do during and after use of this medication before an emergency occurs.  What side effects may I notice from receiving this medication? Side effects that you should report to your care team as soon as possible: Allergic reactions--skin rash, itching, hives, swelling of the face, lips, tongue, or throat Side effects that usually do not require medical attention (report these to your care team if they continue or are bothersome): Constipation Dryness or irritation inside the nose Headache Increase in blood pressure Muscle spasms Stuffy  nose Toothache This list may not describe all possible side effects. Call your doctor for medical advice about side effects. You may report side effects to FDA at 1-800-FDA-1088.  Where should I keep my medication? Because this is an emergency medication, you should keep it with you at all times.  Keep out of the reach of children and pets. Store between 20 and 25 degrees C (68 and 77 degrees F). Do not freeze. Throw away any unused medication after the expiration date. Keep in original box until ready to use.  NOTE: This sheet is a summary. It may not cover all possible information. If you have questions about this medicine, talk to your doctor, pharmacist, or health care provider.   2023 [employer] Standard (2020-11-15 00:00:00)  ____________________________________________________________________________________________   
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
## 2022-09-09 ENCOUNTER — Encounter: Payer: Self-pay | Admitting: Pain Medicine

## 2022-09-09 ENCOUNTER — Ambulatory Visit: Payer: PPO | Attending: Pain Medicine | Admitting: Pain Medicine

## 2022-09-09 VITALS — BP 141/68 | HR 78 | Temp 97.4°F | Ht 70.0 in | Wt 218.0 lb

## 2022-09-09 DIAGNOSIS — M25551 Pain in right hip: Secondary | ICD-10-CM | POA: Diagnosis not present

## 2022-09-09 DIAGNOSIS — G8929 Other chronic pain: Secondary | ICD-10-CM | POA: Diagnosis not present

## 2022-09-09 DIAGNOSIS — Z79891 Long term (current) use of opiate analgesic: Secondary | ICD-10-CM

## 2022-09-09 DIAGNOSIS — M5414 Radiculopathy, thoracic region: Secondary | ICD-10-CM

## 2022-09-09 DIAGNOSIS — M79602 Pain in left arm: Secondary | ICD-10-CM | POA: Diagnosis not present

## 2022-09-09 DIAGNOSIS — M542 Cervicalgia: Secondary | ICD-10-CM | POA: Diagnosis not present

## 2022-09-09 DIAGNOSIS — M961 Postlaminectomy syndrome, not elsewhere classified: Secondary | ICD-10-CM | POA: Insufficient documentation

## 2022-09-09 DIAGNOSIS — Z79899 Other long term (current) drug therapy: Secondary | ICD-10-CM | POA: Diagnosis not present

## 2022-09-09 DIAGNOSIS — M79601 Pain in right arm: Secondary | ICD-10-CM | POA: Diagnosis not present

## 2022-09-09 DIAGNOSIS — M545 Low back pain, unspecified: Secondary | ICD-10-CM | POA: Insufficient documentation

## 2022-09-09 DIAGNOSIS — M25559 Pain in unspecified hip: Secondary | ICD-10-CM | POA: Diagnosis not present

## 2022-09-09 DIAGNOSIS — M25552 Pain in left hip: Secondary | ICD-10-CM | POA: Diagnosis not present

## 2022-09-09 DIAGNOSIS — G894 Chronic pain syndrome: Secondary | ICD-10-CM

## 2022-09-09 MED ORDER — MORPHINE SULFATE ER 15 MG PO TBCR: 15.0000 mg | EXTENDED_RELEASE_TABLET | Freq: Two times a day (BID) | ORAL | 0 refills | Status: DC

## 2022-09-09 NOTE — Telephone Encounter (Signed)
Patient is returning phone call. Patient phone number is [phone].

## 2022-09-09 NOTE — Progress Notes (Signed)
Nursing Pain Medication Assessment:  Safety precautions to be maintained throughout the outpatient stay will include: orient to surroundings, keep bed in low position, maintain call bell within reach at all times, provide assistance with transfer out of bed and ambulation.  Medication Inspection Compliance: Pill count conducted under aseptic conditions, in front of the patient. Neither the pills nor the bottle was removed from the patient's sight at any time. Once count was completed pills were immediately returned to the patient in their original bottle.  Medication: Morphine ER (MSContin) Pill/Patch Count:  8 of 60 pills remain Pill/Patch Appearance: Markings consistent with prescribed medication Bottle Appearance: Standard pharmacy container. Clearly labeled. Filled Date: 5 / 24 / 2024 Last Medication intake:  TodaySafety precautions to be maintained throughout the outpatient stay will include: orient to surroundings, keep bed in low position, maintain call bell within reach at all times, provide assistance with transfer out of bed and ambulation.

## 2022-09-09 NOTE — Telephone Encounter (Signed)
Patient needs order sent for POC machine to [facility]. Dr. Vassie Loll is out of the office until June 24th. Florentina Addison are you okay with me placing order under your name?

## 2022-09-09 NOTE — Telephone Encounter (Signed)
ONO on RA 08/07/22 showed significant desat <88% for 8h. Please send order to [facility] for home concentrator 3 lpm at night. Needs to repeat ONO once on 3 lpm oxygen/[employer]. Thanks.

## 2022-09-09 NOTE — Telephone Encounter (Signed)
Spoke with pt who states the order would be a home concentrator not a portable one.

## 2022-09-09 NOTE — Telephone Encounter (Signed)
Dr. Reginia Naas last note.Marland KitchenMarland Kitchen"He does desaturate to 90% but does not need oxygen on exertion. We will reassess nocturnal hypoxia with total oximetry on room air/using his inspire device.  Depending on nature of desaturation we will try to discern whether this is related to OSA or lung disease" Is he purchasing a POC or one of their home oxygen concentrators? A POC unit is not appropriate for him to use at night.

## 2022-09-09 NOTE — Telephone Encounter (Signed)
ATC X1 LVM for patient to call the office back 

## 2022-09-10 NOTE — Telephone Encounter (Signed)
Spoke with pt and notified him home concentrator order will be placed today to [facility]. Pt also made aware of need for reapeat ONO which was ordered as well. Pt stated understanding. Nothing further needed at this time.

## 2022-09-12 ENCOUNTER — Other Ambulatory Visit (HOSPITAL_COMMUNITY): Payer: Self-pay

## 2022-09-14 ENCOUNTER — Other Ambulatory Visit: Payer: Self-pay

## 2022-09-14 ENCOUNTER — Other Ambulatory Visit (HOSPITAL_COMMUNITY): Payer: Self-pay

## 2022-09-14 MED ORDER — ATORVASTATIN CALCIUM 40 MG PO TABS
40.0000 mg | ORAL_TABLET | Freq: Every day | ORAL | 1 refills | Status: DC
  Filled 2022-09-14: qty 90, 90d supply, fill #0
  Filled 2022-12-11: qty 90, 90d supply, fill #1

## 2022-09-14 NOTE — Telephone Encounter (Signed)
[facility] rep Orvilla Fus)  was told by the office that Dr. Vassie Loll wont be in office until today 09/14/22. [facility] fax'd in a request that needs to be signed by Dr. Vassie Loll and needs it to be signed and faxed back to [facility]   Fax: [phone]

## 2022-09-15 LAB — TOXASSURE SELECT 13 (MW), URINE

## 2022-09-21 ENCOUNTER — Telehealth (HOSPITAL_BASED_OUTPATIENT_CLINIC_OR_DEPARTMENT_OTHER): Payer: Self-pay | Admitting: Pulmonary Disease

## 2022-09-22 NOTE — Telephone Encounter (Signed)
Addressed in prior telephone encounter from 09/04/22. Nothing further needed at this time.

## 2022-09-22 NOTE — Telephone Encounter (Signed)
Spoke with pt who had POC delivered from [facility] yesterday 09/21/22. Pt is requesting that [facility] come pick up home O2 concentrator. After review of pt's chart ONO results from May 2024 proved pt needed to stay on O2 at night. ATC pt again to see if he was currently using home O2 at night but had to leave voicemail.  Will await return call.

## 2022-09-23 ENCOUNTER — Telehealth: Payer: Self-pay | Admitting: Cardiovascular Disease

## 2022-09-23 ENCOUNTER — Other Ambulatory Visit: Payer: Self-pay

## 2022-09-23 DIAGNOSIS — G4733 Obstructive sleep apnea (adult) (pediatric): Secondary | ICD-10-CM | POA: Diagnosis not present

## 2022-09-23 MED ORDER — SPIRONOLACTONE 25 MG PO TABS
25.0000 mg | ORAL_TABLET | Freq: Every day | ORAL | 0 refills | Status: DC
  Filled 2022-09-23: qty 90, 90d supply, fill #0

## 2022-09-23 MED ORDER — LOSARTAN POTASSIUM 50 MG PO TABS
50.0000 mg | ORAL_TABLET | Freq: Every day | ORAL | 0 refills | Status: DC
  Filled 2022-09-23: qty 90, 90d supply, fill #0

## 2022-09-23 NOTE — Telephone Encounter (Signed)
*  STAT* If patient is at the pharmacy, call can be transferred to refill team.   1. Which medications need to be refilled? (please list name of each medication and dose if known) losartan (COZAAR) 50 MG tablet   spironolactone (ALDACTONE) 25 MG tablet   2. Which pharmacy/location (including street and city if local pharmacy) is medication to be sent to? [place] [facility]   3. Do they need a 30 day or 90 day supply? 90

## 2022-09-23 NOTE — Telephone Encounter (Signed)
Requested Prescriptions   Signed Prescriptions Disp Refills   losartan (COZAAR) 50 MG tablet 90 tablet 0    Sig: Take 1 tablet (50 mg total) by mouth daily.    Authorizing Provider: Lorine Bears A    Ordering User: Guerry Minors   spironolactone (ALDACTONE) 25 MG tablet 90 tablet 0    Sig: Take 1 tablet (25 mg total) by mouth daily.    Authorizing Provider: Lorine Bears A    Ordering User: Guerry Minors

## 2022-09-28 NOTE — Telephone Encounter (Signed)
See encounter from 09/21/22. Will close this encounter.

## 2022-09-28 NOTE — Telephone Encounter (Signed)
Please try PT again for customary 2 attempts. TY.

## 2022-10-01 ENCOUNTER — Telehealth: Payer: Self-pay | Admitting: Nurse Practitioner

## 2022-10-02 DIAGNOSIS — Z4422 Encounter for fitting and adjustment of artificial left eye: Secondary | ICD-10-CM | POA: Diagnosis not present

## 2022-10-07 NOTE — Telephone Encounter (Signed)
Called and spoke with pt, he verbalized understanding, NFN at this time
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
## 2022-10-20 ENCOUNTER — Ambulatory Visit: Payer: PPO | Admitting: Urology

## 2022-10-20 ENCOUNTER — Encounter: Payer: Self-pay | Admitting: Urology

## 2022-10-20 VITALS — BP 113/70 | HR 74 | Ht 70.0 in | Wt 205.0 lb

## 2022-10-20 DIAGNOSIS — N3281 Overactive bladder: Secondary | ICD-10-CM | POA: Diagnosis not present

## 2022-10-20 LAB — BLADDER SCAN AMB NON-IMAGING: Scan Result: 16

## 2022-10-20 MED ORDER — GEMTESA 75 MG PO TABS: 75.0000 mg | ORAL_TABLET | Freq: Every day | ORAL | Status: DC

## 2022-10-20 MED ORDER — MIRABEGRON ER 50 MG PO TB24
50.0000 mg | ORAL_TABLET | Freq: Every day | ORAL | 11 refills | Status: DC
  Filled 2023-09-18: qty 30, 30d supply, fill #0
  Filled 2023-10-19: qty 30, 30d supply, fill #1

## 2022-10-21 ENCOUNTER — Telehealth: Payer: Self-pay | Admitting: Internal Medicine

## 2022-10-21 NOTE — Telephone Encounter (Signed)
Called patient regarding August appointments, patient is notified.  

## 2022-10-27 ENCOUNTER — Telehealth: Payer: Self-pay | Admitting: Medical Oncology

## 2022-10-27 ENCOUNTER — Ambulatory Visit (HOSPITAL_COMMUNITY): Payer: PPO

## 2022-10-27 NOTE — Telephone Encounter (Signed)
He needs to reschedule his appts on 08/13. I lvm to return my call about rescheduling his appt. and what dates work for him.

## 2022-10-29 ENCOUNTER — Other Ambulatory Visit: Payer: PPO

## 2022-10-29 ENCOUNTER — Ambulatory Visit (HOSPITAL_COMMUNITY): Payer: PPO

## 2022-10-30 ENCOUNTER — Ambulatory Visit: Admission: RE | Admit: 2022-10-30 | Payer: PPO | Source: Home / Self Care

## 2022-10-30 ENCOUNTER — Encounter: Admission: RE | Payer: Self-pay | Source: Home / Self Care

## 2022-10-30 ENCOUNTER — Telehealth: Payer: Self-pay | Admitting: Internal Medicine

## 2022-10-30 HISTORY — DX: Malignant neoplasm of unspecified part of right bronchus or lung: C34.91

## 2022-10-30 HISTORY — DX: Atherosclerosis of aorta: I70.0

## 2022-10-30 HISTORY — DX: Other intervertebral disc degeneration, lumbar region: M51.36

## 2022-10-30 HISTORY — DX: Presence of artificial eye: Z97.0

## 2022-10-30 HISTORY — DX: Polyneuropathy, unspecified: G62.9

## 2022-10-30 HISTORY — DX: Malignant (primary) neoplasm, unspecified: C80.1

## 2022-10-30 HISTORY — DX: Body mass index (BMI) 32.0-32.9, adult: Z68.32

## 2022-10-30 HISTORY — DX: Other intervertebral disc degeneration, lumbar region without mention of lumbar back pain or lower extremity pain: M51.369

## 2022-10-30 HISTORY — DX: Anesthesia of skin: R20.0

## 2022-10-30 HISTORY — DX: Emphysema, unspecified: J43.9

## 2022-10-30 SURGERY — COLONOSCOPY WITH PROPOFOL
Anesthesia: General

## 2022-10-30 NOTE — Telephone Encounter (Signed)
Rescheduled 08/13 appointment to 08/20 due to scheduling error, patient has been called and notified.

## 2022-11-02 ENCOUNTER — Ambulatory Visit (HOSPITAL_COMMUNITY)
Admission: RE | Admit: 2022-11-02 | Discharge: 2022-11-02 | Disposition: A | Discharge status: Home / Self Care | Payer: PPO | Source: Ambulatory Visit | Attending: Internal Medicine | Admitting: Internal Medicine

## 2022-11-02 ENCOUNTER — Inpatient Hospital Stay: Payer: PPO | Attending: Internal Medicine

## 2022-11-02 ENCOUNTER — Encounter (HOSPITAL_COMMUNITY): Payer: Self-pay

## 2022-11-02 ENCOUNTER — Other Ambulatory Visit: Payer: Self-pay

## 2022-11-02 DIAGNOSIS — C3411 Malignant neoplasm of upper lobe, right bronchus or lung: Secondary | ICD-10-CM | POA: Insufficient documentation

## 2022-11-02 DIAGNOSIS — C3490 Malignant neoplasm of unspecified part of unspecified bronchus or lung: Secondary | ICD-10-CM | POA: Insufficient documentation

## 2022-11-02 DIAGNOSIS — J90 Pleural effusion, not elsewhere classified: Secondary | ICD-10-CM | POA: Diagnosis not present

## 2022-11-02 DIAGNOSIS — Z902 Acquired absence of lung [part of]: Secondary | ICD-10-CM | POA: Diagnosis not present

## 2022-11-02 DIAGNOSIS — I700 Atherosclerosis of aorta: Secondary | ICD-10-CM | POA: Diagnosis not present

## 2022-11-02 LAB — CMP (CANCER CENTER ONLY)
ALT: 22 U/L (ref 0–44)
AST: 19 U/L (ref 15–41)
Albumin: 3.9 g/dL (ref 3.5–5.0)
Alkaline Phosphatase: 70 U/L (ref 38–126)
Anion gap: 7 (ref 5–15)
BUN: 16 mg/dL (ref 8–23)
CO2: 29 mmol/L (ref 22–32)
Calcium: 9 mg/dL (ref 8.9–10.3)
Chloride: 100 mmol/L (ref 98–111)
Creatinine: 0.84 mg/dL (ref 0.61–1.24)
GFR, Estimated: >60 mL/min (ref >60)
Glucose, Bld: 83 mg/dL (ref 70–99)
Potassium: 4.5 mmol/L (ref 3.5–5.1)
Sodium: 136 mmol/L (ref 135–145)
Total Bilirubin: 0.6 mg/dL (ref 0.3–1.2)
Total Protein: 6.7 g/dL (ref 6.5–8.1)

## 2022-11-02 LAB — CBC WITH DIFFERENTIAL (CANCER CENTER ONLY)
Abs Immature Granulocytes: 0.02 10*3/uL (ref 0.00–0.07)
Basophils Absolute: 0.1 10*3/uL (ref 0.0–0.1)
Basophils Relative: 1 %
Eosinophils Absolute: 0.1 10*3/uL (ref 0.0–0.5)
Eosinophils Relative: 2 %
HCT: 43.8 % (ref 39.0–52.0)
Hemoglobin: 14.5 g/dL (ref 13.0–17.0)
Immature Granulocytes: 0 %
Lymphocytes Relative: 30 %
Lymphs Abs: 1.8 10*3/uL (ref 0.7–4.0)
MCH: 30.3 pg (ref 26.0–34.0)
MCHC: 33.1 g/dL (ref 30.0–36.0)
MCV: 91.4 fL (ref 80.0–100.0)
Monocytes Absolute: 0.5 10*3/uL (ref 0.1–1.0)
Monocytes Relative: 9 %
Neutro Abs: 3.5 10*3/uL (ref 1.7–7.7)
Neutrophils Relative %: 58 %
Platelet Count: 194 10*3/uL (ref 150–400)
RBC: 4.79 MIL/uL (ref 4.22–5.81)
RDW: 13.2 % (ref 11.5–15.5)
WBC Count: 6.1 10*3/uL (ref 4.0–10.5)
nRBC: 0 % (ref 0.0–0.2)

## 2022-11-02 MED ORDER — IOHEXOL 300 MG/ML  SOLN
75.0000 mL | Freq: Once | INTRAMUSCULAR | Status: AC | PRN
  Administered 2022-11-02: 75 mL via INTRAVENOUS

## 2022-11-02 MED ORDER — SODIUM CHLORIDE (PF) 0.9 % IJ SOLN
INTRAMUSCULAR | Status: AC
  Filled 2022-11-02: qty 50

## 2022-11-03 ENCOUNTER — Ambulatory Visit: Payer: PPO | Admitting: Internal Medicine
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
## 2022-11-09 ENCOUNTER — Encounter (HOSPITAL_BASED_OUTPATIENT_CLINIC_OR_DEPARTMENT_OTHER): Payer: Self-pay | Admitting: Pulmonary Disease

## 2022-11-09 ENCOUNTER — Ambulatory Visit (INDEPENDENT_AMBULATORY_CARE_PROVIDER_SITE_OTHER): Payer: PPO | Admitting: Pulmonary Disease

## 2022-11-09 VITALS — BP 118/62 | HR 51 | Resp 22 | Ht 70.0 in | Wt 211.4 lb

## 2022-11-09 DIAGNOSIS — J9611 Chronic respiratory failure with hypoxia: Secondary | ICD-10-CM | POA: Diagnosis not present

## 2022-11-09 DIAGNOSIS — G4733 Obstructive sleep apnea (adult) (pediatric): Secondary | ICD-10-CM

## 2022-11-09 DIAGNOSIS — C3491 Malignant neoplasm of unspecified part of right bronchus or lung: Secondary | ICD-10-CM

## 2022-11-09 DIAGNOSIS — J4489 Other specified chronic obstructive pulmonary disease: Secondary | ICD-10-CM

## 2022-11-09 DIAGNOSIS — J439 Emphysema, unspecified: Secondary | ICD-10-CM | POA: Diagnosis not present

## 2022-11-09 NOTE — Assessment & Plan Note (Signed)
Surveillance scan was reviewed which does not show any evidence of recurrent malignancy.  He has a small right effusion. Will plan for repeat chest x-ray in 3 months

## 2022-11-09 NOTE — Assessment & Plan Note (Signed)
Per last oximetry, 3 L of oxygen should suffice during sleep He does not seem to require oxygen in the daytime

## 2022-11-09 NOTE — Assessment & Plan Note (Signed)
Compliant with inspire, using "all night and every night". We will review download

## 2022-11-09 NOTE — Assessment & Plan Note (Signed)
He wonders if he can use Anoro every other day.  He cannot tell the difference on a daily basis.  He is in the donut hole and his co-pay is very high. I gave him a sample of Trelegy today We discussed signs and symptoms of COPD exacerbation
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
## 2022-11-09 NOTE — Patient Instructions (Addendum)
X Sample of Trelegy 100 - take once daily instead of ANoro , rinse outh after use   Continue 3L oxygen during sleep  X CXR in 3 months to follow on pleural effusion (fluid around RT lung)

## 2022-11-10 ENCOUNTER — Inpatient Hospital Stay: Payer: PPO | Admitting: Physician Assistant

## 2022-11-10 VITALS — BP 126/59 | HR 60 | Temp 98.1°F | Resp 14 | Wt 214.0 lb

## 2022-11-10 DIAGNOSIS — Z6831 Body mass index (BMI) 31.0-31.9, adult: Secondary | ICD-10-CM | POA: Diagnosis not present

## 2022-11-10 DIAGNOSIS — I10 Essential (primary) hypertension: Secondary | ICD-10-CM | POA: Diagnosis not present

## 2022-11-10 DIAGNOSIS — C3491 Malignant neoplasm of unspecified part of right bronchus or lung: Secondary | ICD-10-CM | POA: Diagnosis not present

## 2022-11-10 DIAGNOSIS — Z0000 Encounter for general adult medical examination without abnormal findings: Secondary | ICD-10-CM | POA: Diagnosis not present

## 2022-11-10 DIAGNOSIS — I700 Atherosclerosis of aorta: Secondary | ICD-10-CM | POA: Diagnosis not present

## 2022-11-10 DIAGNOSIS — C3490 Malignant neoplasm of unspecified part of unspecified bronchus or lung: Secondary | ICD-10-CM | POA: Diagnosis not present

## 2022-11-10 DIAGNOSIS — C3411 Malignant neoplasm of upper lobe, right bronchus or lung: Secondary | ICD-10-CM | POA: Diagnosis not present

## 2022-11-10 DIAGNOSIS — Z87891 Personal history of nicotine dependence: Secondary | ICD-10-CM | POA: Diagnosis not present

## 2022-11-10 DIAGNOSIS — I2510 Atherosclerotic heart disease of native coronary artery without angina pectoris: Secondary | ICD-10-CM | POA: Diagnosis not present

## 2022-11-10 DIAGNOSIS — Z125 Encounter for screening for malignant neoplasm of prostate: Secondary | ICD-10-CM | POA: Diagnosis not present

## 2022-11-10 DIAGNOSIS — Z902 Acquired absence of lung [part of]: Secondary | ICD-10-CM | POA: Diagnosis not present

## 2022-11-10 DIAGNOSIS — M4807 Spinal stenosis, lumbosacral region: Secondary | ICD-10-CM | POA: Diagnosis not present

## 2022-11-10 DIAGNOSIS — E1165 Type 2 diabetes mellitus with hyperglycemia: Secondary | ICD-10-CM | POA: Diagnosis not present

## 2022-11-11 ENCOUNTER — Ambulatory Visit (INDEPENDENT_AMBULATORY_CARE_PROVIDER_SITE_OTHER): Payer: PPO | Admitting: Podiatry

## 2022-11-11 ENCOUNTER — Encounter: Payer: Self-pay | Admitting: Podiatry

## 2022-11-11 DIAGNOSIS — D225 Melanocytic nevi of trunk: Secondary | ICD-10-CM | POA: Diagnosis not present

## 2022-11-11 DIAGNOSIS — B360 Pityriasis versicolor: Secondary | ICD-10-CM | POA: Diagnosis not present

## 2022-11-11 DIAGNOSIS — Z08 Encounter for follow-up examination after completed treatment for malignant neoplasm: Secondary | ICD-10-CM | POA: Diagnosis not present

## 2022-11-11 DIAGNOSIS — D2372 Other benign neoplasm of skin of left lower limb, including hip: Secondary | ICD-10-CM | POA: Diagnosis not present

## 2022-11-11 DIAGNOSIS — D2261 Melanocytic nevi of right upper limb, including shoulder: Secondary | ICD-10-CM | POA: Diagnosis not present

## 2022-11-11 DIAGNOSIS — D2371 Other benign neoplasm of skin of right lower limb, including hip: Secondary | ICD-10-CM

## 2022-11-11 DIAGNOSIS — B351 Tinea unguium: Secondary | ICD-10-CM

## 2022-11-11 DIAGNOSIS — M79676 Pain in unspecified toe(s): Secondary | ICD-10-CM

## 2022-11-11 DIAGNOSIS — D2271 Melanocytic nevi of right lower limb, including hip: Secondary | ICD-10-CM | POA: Diagnosis not present

## 2022-11-11 DIAGNOSIS — Z85828 Personal history of other malignant neoplasm of skin: Secondary | ICD-10-CM | POA: Diagnosis not present

## 2022-11-11 DIAGNOSIS — E1165 Type 2 diabetes mellitus with hyperglycemia: Secondary | ICD-10-CM | POA: Diagnosis not present

## 2022-11-11 DIAGNOSIS — D2262 Melanocytic nevi of left upper limb, including shoulder: Secondary | ICD-10-CM | POA: Diagnosis not present

## 2022-11-11 NOTE — Progress Notes (Signed)
He presents today chief complaint of painful elongated toenails and calluses.  He states that I just trimmed my toenails but they are a little jagged on the 1 side.  Objective: Vital signs are stable he is alert and oriented x 3 there is no erythema edema salines drainage odor pulses remain palpable.  Toenails are slightly thickened and elongated.  Benign skin lesions plantar aspect of bilateral foot.  Assessment: Pain limb secondary to benign skin lesions and toenails.  Plan: Utilizing the Dremel I was able to smooth down the nails and shorten them to some degree.  I also debrided the area of reactive hyperkeratosis beneath the first metatarsophalangeal joint and the fourth metatarsal head of the left foot.

## 2022-11-17 ENCOUNTER — Other Ambulatory Visit: Payer: Self-pay

## 2022-11-17 ENCOUNTER — Other Ambulatory Visit: Payer: Self-pay | Admitting: Cardiovascular Disease

## 2022-11-17 ENCOUNTER — Other Ambulatory Visit (HOSPITAL_COMMUNITY): Payer: Self-pay

## 2022-11-17 DIAGNOSIS — K219 Gastro-esophageal reflux disease without esophagitis: Secondary | ICD-10-CM | POA: Diagnosis not present

## 2022-11-17 DIAGNOSIS — K635 Polyp of colon: Secondary | ICD-10-CM | POA: Diagnosis not present

## 2022-11-17 DIAGNOSIS — K644 Residual hemorrhoidal skin tags: Secondary | ICD-10-CM | POA: Diagnosis not present

## 2022-11-17 DIAGNOSIS — G629 Polyneuropathy, unspecified: Secondary | ICD-10-CM | POA: Diagnosis not present

## 2022-11-17 DIAGNOSIS — M5136 Other intervertebral disc degeneration, lumbar region: Secondary | ICD-10-CM | POA: Diagnosis not present

## 2022-11-17 DIAGNOSIS — M25551 Pain in right hip: Secondary | ICD-10-CM | POA: Diagnosis not present

## 2022-11-17 DIAGNOSIS — I10 Essential (primary) hypertension: Secondary | ICD-10-CM | POA: Diagnosis not present

## 2022-11-17 DIAGNOSIS — Z1211 Encounter for screening for malignant neoplasm of colon: Secondary | ICD-10-CM | POA: Diagnosis not present

## 2022-11-17 DIAGNOSIS — R1314 Dysphagia, pharyngoesophageal phase: Secondary | ICD-10-CM | POA: Diagnosis not present

## 2022-11-17 DIAGNOSIS — I2510 Atherosclerotic heart disease of native coronary artery without angina pectoris: Secondary | ICD-10-CM | POA: Diagnosis not present

## 2022-11-17 DIAGNOSIS — E1165 Type 2 diabetes mellitus with hyperglycemia: Secondary | ICD-10-CM | POA: Diagnosis not present

## 2022-11-17 DIAGNOSIS — C3491 Malignant neoplasm of unspecified part of right bronchus or lung: Secondary | ICD-10-CM | POA: Diagnosis not present

## 2022-11-17 MED ORDER — GLIPIZIDE ER 10 MG PO TB24
10.0000 mg | ORAL_TABLET | Freq: Every day | ORAL | 1 refills | Status: DC
  Filled 2022-11-17: qty 90, 90d supply, fill #0
  Filled 2023-02-15: qty 90, 90d supply, fill #1

## 2022-11-18 ENCOUNTER — Other Ambulatory Visit: Payer: Self-pay

## 2022-11-18 ENCOUNTER — Other Ambulatory Visit (HOSPITAL_COMMUNITY): Payer: Self-pay

## 2022-11-18 MED ORDER — SPIRONOLACTONE 25 MG PO TABS
25.0000 mg | ORAL_TABLET | Freq: Every day | ORAL | 0 refills | Status: DC
  Filled 2022-11-18 – 2022-12-11 (×2): qty 90, 90d supply, fill #0

## 2022-11-19 ENCOUNTER — Other Ambulatory Visit: Payer: Self-pay

## 2022-11-24 DIAGNOSIS — K629 Disease of anus and rectum, unspecified: Secondary | ICD-10-CM | POA: Diagnosis not present

## 2022-11-26 DIAGNOSIS — R059 Cough, unspecified: Secondary | ICD-10-CM | POA: Diagnosis not present

## 2022-11-26 DIAGNOSIS — J300 Vasomotor rhinitis: Secondary | ICD-10-CM | POA: Diagnosis not present

## 2022-11-26 DIAGNOSIS — R221 Localized swelling, mass and lump, neck: Secondary | ICD-10-CM | POA: Diagnosis not present

## 2022-11-26 DIAGNOSIS — D1030 Benign neoplasm of unspecified part of mouth: Secondary | ICD-10-CM | POA: Diagnosis not present

## 2022-11-26 DIAGNOSIS — R0981 Nasal congestion: Secondary | ICD-10-CM | POA: Diagnosis not present

## 2022-11-26 DIAGNOSIS — J301 Allergic rhinitis due to pollen: Secondary | ICD-10-CM | POA: Diagnosis not present

## 2022-11-26 DIAGNOSIS — J329 Chronic sinusitis, unspecified: Secondary | ICD-10-CM | POA: Diagnosis not present

## 2022-11-26 DIAGNOSIS — J309 Allergic rhinitis, unspecified: Secondary | ICD-10-CM | POA: Diagnosis not present

## 2022-11-30 ENCOUNTER — Other Ambulatory Visit: Payer: Self-pay

## 2022-11-30 ENCOUNTER — Telehealth: Payer: Self-pay | Admitting: Medical Oncology

## 2022-11-30 DIAGNOSIS — J329 Chronic sinusitis, unspecified: Secondary | ICD-10-CM | POA: Diagnosis not present

## 2022-11-30 NOTE — Telephone Encounter (Signed)
Pt appts not correct. Schedule message sent.

## 2022-12-04 ENCOUNTER — Other Ambulatory Visit (HOSPITAL_COMMUNITY): Payer: Self-pay

## 2022-12-07 ENCOUNTER — Encounter: Payer: PPO | Admitting: Pain Medicine

## 2022-12-09 ENCOUNTER — Ambulatory Visit
Admission: RE | Admit: 2022-12-09 | Discharge: 2022-12-09 | Disposition: A | Discharge status: Home / Self Care | Payer: PPO | Source: Ambulatory Visit | Attending: Pulmonary Disease | Admitting: Pulmonary Disease

## 2022-12-09 ENCOUNTER — Ambulatory Visit
Admission: RE | Admit: 2022-12-09 | Discharge: 2022-12-09 | Disposition: A | Discharge status: Home / Self Care | Payer: PPO | Attending: Pulmonary Disease | Admitting: Pulmonary Disease

## 2022-12-09 ENCOUNTER — Encounter: Payer: Self-pay | Admitting: Pain Medicine

## 2022-12-09 ENCOUNTER — Ambulatory Visit: Payer: PPO | Admitting: Pain Medicine

## 2022-12-09 VITALS — BP 120/68 | HR 88 | Temp 98.1°F | Ht 70.0 in | Wt 210.0 lb

## 2022-12-09 DIAGNOSIS — M545 Low back pain, unspecified: Secondary | ICD-10-CM

## 2022-12-09 DIAGNOSIS — C3491 Malignant neoplasm of unspecified part of right bronchus or lung: Secondary | ICD-10-CM | POA: Insufficient documentation

## 2022-12-09 DIAGNOSIS — M542 Cervicalgia: Secondary | ICD-10-CM | POA: Diagnosis not present

## 2022-12-09 DIAGNOSIS — G894 Chronic pain syndrome: Secondary | ICD-10-CM

## 2022-12-09 DIAGNOSIS — Z79891 Long term (current) use of opiate analgesic: Secondary | ICD-10-CM | POA: Diagnosis not present

## 2022-12-09 DIAGNOSIS — M961 Postlaminectomy syndrome, not elsewhere classified: Secondary | ICD-10-CM | POA: Diagnosis not present

## 2022-12-09 DIAGNOSIS — M25552 Pain in left hip: Secondary | ICD-10-CM

## 2022-12-09 DIAGNOSIS — M25551 Pain in right hip: Secondary | ICD-10-CM | POA: Diagnosis not present

## 2022-12-09 DIAGNOSIS — R918 Other nonspecific abnormal finding of lung field: Secondary | ICD-10-CM | POA: Diagnosis not present

## 2022-12-09 DIAGNOSIS — I700 Atherosclerosis of aorta: Secondary | ICD-10-CM | POA: Diagnosis not present

## 2022-12-09 DIAGNOSIS — Z79899 Other long term (current) drug therapy: Secondary | ICD-10-CM | POA: Diagnosis not present

## 2022-12-09 DIAGNOSIS — M5414 Radiculopathy, thoracic region: Secondary | ICD-10-CM | POA: Diagnosis not present

## 2022-12-09 DIAGNOSIS — M79602 Pain in left arm: Secondary | ICD-10-CM | POA: Diagnosis not present

## 2022-12-09 DIAGNOSIS — J90 Pleural effusion, not elsewhere classified: Secondary | ICD-10-CM | POA: Diagnosis not present

## 2022-12-09 DIAGNOSIS — G8929 Other chronic pain: Secondary | ICD-10-CM | POA: Insufficient documentation

## 2022-12-09 DIAGNOSIS — M79601 Pain in right arm: Secondary | ICD-10-CM

## 2022-12-09 DIAGNOSIS — M25559 Pain in unspecified hip: Secondary | ICD-10-CM | POA: Insufficient documentation

## 2022-12-09 MED ORDER — OXYCODONE HCL 5 MG PO TABS
ORAL_TABLET | ORAL | 0 refills | Status: DC
Start: 2022-12-09 — End: 2023-03-05

## 2022-12-09 NOTE — Progress Notes (Signed)
Nursing Pain Medication Assessment:  Safety precautions to be maintained throughout the outpatient stay will include: orient to surroundings, keep bed in low position, maintain call bell within reach at all times, provide assistance with transfer out of bed and ambulation.  Medication Inspection Compliance: Pill count conducted under aseptic conditions, in front of the patient. Neither the pills nor the bottle was removed from the patient's sight at any time. Once count was completed pills were immediately returned to the patient in their original bottle.  Medication: Morphine ER (MSContin) Pill/Patch Count:  8 of 60 pills remain Pill/Patch Appearance: Markings consistent with prescribed medication Bottle Appearance: Standard pharmacy container. Clearly labeled. Filled Date: 8 / 22 / 2024 Last Medication intake:  TodaySafety precautions to be maintained throughout the outpatient stay will include: orient to surroundings, keep bed in low position, maintain call bell within reach at all times, provide assistance with transfer out of bed and ambulation.

## 2022-12-09 NOTE — Patient Instructions (Addendum)
______________________________________________________________________    Drug Holidays  What is a "Drug Holiday"? Drug Holiday: is the name given to the process of slowly tapering down and temporarily stopping the pain medication for the purpose of decreasing or eliminating tolerance to the drug.  Benefits Improved effectiveness Decreased required effective dose Improved pain control End dependence on high dose therapy Decrease cost of therapy Uncovering "opioid-induced hyperalgesia". (OIH)  What is "opioid hyperalgesia"? It is a paradoxical increase in pain caused by exposure to opioids. Stopping the opioid pain medication, contrary to the expected, it actually decreases or completely eliminates the pain. Ref.: "A comprehensive review of opioid-induced hyperalgesia". Donney Rankins, et.al. Pain Physician. 2011 Mar-Apr;14(2):145-61.  What is tolerance? Tolerance: the progressive loss of effectiveness of a pain medicine due to repetitive use. A common problem of opioid pain medications.  How long should a "Drug Holiday" last? Effectiveness depends on the patient staying off all opioid pain medicines for a minimum of 14 consecutive days. (2 weeks)  How about just taking less of the medicine? Does not work. Will not accomplish goal of eliminating the excess receptors.  How about switching to a different pain medicine? (AKA. "Opioid rotation") Does not work. Creates the illusion of effectiveness by taking advantage of inaccurate equivalent dose calculations between different opioids. -This "technique" was promoted by studies funded by [employer], such as [employer], creators of "OxyContin".  Can I stop the medicine "cold [place]"? We do not recommend it. You should always coordinate with your prescribing physician to make the transition as smoothly as possible. Avoid stopping the medicine abruptly without consulting. We recommend a "slow taper".  What is a slow  taper? Taper: refers to the gradual decrease in dose.   How do I stop/taper the dose? Slowly. Decrease the daily amount of pills that you take by one (1) pill every seven (7) days. This is called a "slow downward taper". Example: if you normally take four (4) pills per day, drop it to three (3) pills per day for seven (7) days, then to two (2) pills per day for seven (7) days, then to one (1) per day for seven (7) days, and then stop the medicine. The 14 day "Drug Holiday" starts on the first day without medicine.   Will I experience withdrawals? Unlikely with a slow taper.  What triggers withdrawals? Withdrawals are triggered by the sudden/abrupt stop of high dose opioids. Withdrawals can be avoided by slowly decreasing the dose over a prolonged period of time.  What are withdrawals? Symptoms associated with sudden/abrupt reduction/stopping of high-dose, long-term use of pain medication. Withdrawal are seldom seen on low dose therapy, or patients rarely taking opioid medication.  Early Withdrawal Symptoms may include: Agitation Anxiety Muscle aches Increased tearing Insomnia Runny nose Sweating Yawning  Late symptoms may include: Abdominal cramping Diarrhea Dilated pupils Goose bumps Nausea Vomiting  When could I see withdrawals? Onset: 8-24 hours after last use for most opioids. 12-48 hours for long-acting opioids (i.e.: methadone)  How long could they last? Duration: 4-10 days for most opioids. 14-21 days for long-acting opioids (i.e.: methadone)  What will happen after I complete my "Drug Holiday"? The need and indications for the opioid analgesic will be reviewed before restarting the medication. Dose requirements will likely decrease and the dose will need to be adjusted accordingly.   (Last update: 06/10/2022) ______________________________________________________________________    ______________________________________________________________________    OTC  Supplements: The following over-the-counter (OTC) supplements may be of some benefits when used in moderation in some  chronic pain conditions. Note: Always consult with your primary care provider and/or pharmacist before taking any OTC medications to make sure there are no "drug-to-drug" interactions with the medications you currently take. Ask your physician which may be beneficial for your particular condition.  Supplement Possible benefit May be of benefit in treatment of   Turmeric/curcumin* anti-inflammatory Joint and muscle aches and pain associated with arthritis and inflammation  Glucosamine/chondroitin (triple strength)* may slow loss of articular cartilage Osteoarthritis  Vitamin D-3* may suppress release of chemicals associated with inflammation Joint and muscle aches and pain associated with arthritis and inflammation  Moringa* anti-inflammatory with mild analgesic effects Joint and muscle aches and pain associated with arthritis and inflammation  Melatonin* Helps reset sleep cycle. Insomnia. May also be helpful in neurodegenerative disorders  Vitamin B-12* may help keep nerves and blood cells healthy as well as maintaining function of nervous system Neuropathies. Nerve pain (Burning pain)  Alpha-Lipoic-Acid (ALA)* antioxidant that may help with nerve health, pain, and blocking the activation of some inflammatory chemicals Diabetic neuropathy and metabolic syndrome       (*Always use manufacturer's recommended dosage.)  ______________________________________________________________________      There is limited information about implanting a spinal cord stimulator in patients with a sleep apnea implant, but here is some information about sleep apnea implants and spinal cord stimulator implants:  Sleep apnea implants There are several types of sleep apnea implants, including the hypoglossal nerve stimulator (HGNS) and the Inspire upper airway stimulation (UAS) device:  HGNS: This  implant is a surgical option for patients with obstructive sleep apnea (OSA) who can't tolerate or don't adhere to continuous positive airway pressure (CPAP) therapy.  Inspire UAS: This implant is a pacemaker-like device that stabilizes the throat during sleep by stimulating the throat muscles. It's an option for patients with moderate to severe OSA who are unable to tolerate CPAP therapy.  Spinal cord stimulator implants These implants are often compatible with pacemakers and defibrillators, but a cardiologist must approve the procedure if the patient has a history of either device. Other contraindications to a spinal cord stimulator implant include severe thrombocytopenia, uncontrolled coagulopathy, and active infection.  Safety considerations The FDA warns that procedures like diathermy, radiation therapy, and external defibrillation can damage implanted stimulation devices if not turned off first.

## 2022-12-09 NOTE — Progress Notes (Signed)
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 electronic or other health record Independently interpreting results (not separately reported) and communicating results to the patient/ family/caregiver Care coordination (not separately reported)  Note by: Oswaldo Done, MD Date: 12/09/2022; Time: 10:45 AM

## 2022-12-11 ENCOUNTER — Other Ambulatory Visit (HOSPITAL_COMMUNITY): Payer: Self-pay

## 2022-12-11 ENCOUNTER — Other Ambulatory Visit: Payer: Self-pay | Admitting: Cardiovascular Disease

## 2022-12-11 ENCOUNTER — Other Ambulatory Visit: Payer: Self-pay

## 2022-12-11 MED ORDER — LOSARTAN POTASSIUM 50 MG PO TABS
50.0000 mg | ORAL_TABLET | Freq: Every day | ORAL | 0 refills | Status: DC
  Filled 2022-12-11: qty 90, 90d supply, fill #0

## 2022-12-11 NOTE — Telephone Encounter (Signed)
Refill request

## 2022-12-16 ENCOUNTER — Other Ambulatory Visit: Payer: Self-pay | Admitting: Internal Medicine

## 2022-12-16 DIAGNOSIS — Z136 Encounter for screening for cardiovascular disorders: Secondary | ICD-10-CM

## 2022-12-18 ENCOUNTER — Ambulatory Visit (INDEPENDENT_AMBULATORY_CARE_PROVIDER_SITE_OTHER): Payer: PPO | Admitting: Podiatry

## 2022-12-18 DIAGNOSIS — L03031 Cellulitis of right toe: Secondary | ICD-10-CM | POA: Diagnosis not present

## 2022-12-18 DIAGNOSIS — E1165 Type 2 diabetes mellitus with hyperglycemia: Secondary | ICD-10-CM

## 2022-12-18 MED ORDER — DOXYCYCLINE HYCLATE 100 MG PO TABS: 100.0000 mg | ORAL_TABLET | Freq: Two times a day (BID) | ORAL | 0 refills | Status: DC
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
## 2022-12-21 ENCOUNTER — Encounter: Payer: Self-pay | Admitting: Nurse Practitioner

## 2022-12-21 ENCOUNTER — Ambulatory Visit (INDEPENDENT_AMBULATORY_CARE_PROVIDER_SITE_OTHER): Payer: PPO | Admitting: Nurse Practitioner

## 2022-12-21 VITALS — BP 120/60 | HR 61 | Ht 70.0 in | Wt 213.4 lb

## 2022-12-21 DIAGNOSIS — J9611 Chronic respiratory failure with hypoxia: Secondary | ICD-10-CM

## 2022-12-21 DIAGNOSIS — C3491 Malignant neoplasm of unspecified part of right bronchus or lung: Secondary | ICD-10-CM | POA: Diagnosis not present

## 2022-12-21 DIAGNOSIS — J4489 Other specified chronic obstructive pulmonary disease: Secondary | ICD-10-CM | POA: Diagnosis not present

## 2022-12-21 DIAGNOSIS — J439 Emphysema, unspecified: Secondary | ICD-10-CM | POA: Diagnosis not present

## 2022-12-21 DIAGNOSIS — G4733 Obstructive sleep apnea (adult) (pediatric): Secondary | ICD-10-CM | POA: Diagnosis not present

## 2022-12-21 DIAGNOSIS — J90 Pleural effusion, not elsewhere classified: Secondary | ICD-10-CM | POA: Diagnosis not present
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
## 2022-12-21 NOTE — Assessment & Plan Note (Signed)
Goal >88-90%. No increased requirement

## 2022-12-21 NOTE — Assessment & Plan Note (Signed)
Appears stable on imaging from 1 month prior. No change in clinical status. Will plan to repeat in 2 months for 3 month follow up, as previously intended. If effusion increases, will need further evaluation with thoracentesis given history. Return precautions reviewed.   Patient Instructions  Continue supplemental oxygen 3 lpm at bedtime  Continue to use Inspire nightly  Continue Anoro 1 puff daily Continue Albuterol inhaler 2 puffs every 6 hours as needed for shortness of breath or wheezing. Notify if symptoms persist despite rescue inhaler/neb use.  Continue Zyrtec 1 tab daily  Repeat chest x ray in 2 months  Follow up in 2 months with Dr. Vassie Loll after chest x ray. If symptoms do not improve or worsen, please contact office for sooner follow up or seek emergency care.

## 2022-12-21 NOTE — Assessment & Plan Note (Signed)
Not addressed at visit. Will assess download at follow up.

## 2022-12-21 NOTE — Patient Instructions (Addendum)
Continue supplemental oxygen 3 lpm at bedtime  Continue to use Inspire nightly  Continue Anoro 1 puff daily Continue Albuterol inhaler 2 puffs every 6 hours as needed for shortness of breath or wheezing. Notify if symptoms persist despite rescue inhaler/neb use.  Continue Zyrtec 1 tab daily  Repeat chest x ray in 2 months  Follow up in 2 months with Dr. Vassie Loll after chest x ray. If symptoms do not improve or worsen, please contact office for sooner follow up or seek emergency care.

## 2022-12-21 NOTE — Assessment & Plan Note (Signed)
Compensated on current regimen. Action plan in place.  

## 2022-12-21 NOTE — Assessment & Plan Note (Signed)
No evidence of recurrence on previous CT imaging. Follow up with oncology as scheduled.

## 2022-12-23 ENCOUNTER — Ambulatory Visit
Admission: RE | Admit: 2022-12-23 | Discharge: 2022-12-23 | Disposition: A | Discharge status: Home / Self Care | Payer: PPO | Source: Ambulatory Visit | Attending: Internal Medicine | Admitting: Internal Medicine

## 2022-12-23 DIAGNOSIS — Z0389 Encounter for observation for other suspected diseases and conditions ruled out: Secondary | ICD-10-CM | POA: Diagnosis not present

## 2022-12-23 DIAGNOSIS — Z136 Encounter for screening for cardiovascular disorders: Secondary | ICD-10-CM | POA: Diagnosis not present

## 2022-12-23 DIAGNOSIS — C3491 Malignant neoplasm of unspecified part of right bronchus or lung: Secondary | ICD-10-CM | POA: Diagnosis not present

## 2023-01-05 ENCOUNTER — Other Ambulatory Visit (HOSPITAL_COMMUNITY): Payer: Self-pay

## 2023-01-05 DIAGNOSIS — M25551 Pain in right hip: Secondary | ICD-10-CM | POA: Diagnosis not present

## 2023-01-05 MED ORDER — PREDNISONE 5 MG PO TABS
ORAL_TABLET | ORAL | 0 refills | Status: AC
  Filled 2023-01-05: qty 21, 12d supply, fill #0

## 2023-01-06 ENCOUNTER — Other Ambulatory Visit (HOSPITAL_COMMUNITY): Payer: Self-pay

## 2023-01-06 ENCOUNTER — Other Ambulatory Visit: Payer: Self-pay

## 2023-01-12 DIAGNOSIS — M1712 Unilateral primary osteoarthritis, left knee: Secondary | ICD-10-CM | POA: Diagnosis not present

## 2023-01-12 DIAGNOSIS — M1611 Unilateral primary osteoarthritis, right hip: Secondary | ICD-10-CM | POA: Diagnosis not present

## 2023-01-14 ENCOUNTER — Telehealth: Payer: Self-pay | Admitting: Nurse Practitioner

## 2023-01-14 DIAGNOSIS — J90 Pleural effusion, not elsewhere classified: Secondary | ICD-10-CM

## 2023-01-14 NOTE — Telephone Encounter (Signed)
Fax received from Dr. Loreli Dollar with [facility] to perform a left total hip arthroplasty under spinal anesthesia on patient.  Patient needs surgery clearance. Surgery is pending. Patient was seen on 12/21/22. Office protocol is a risk assessment can be sent to surgeon if patient has been seen in 60 days or less.   Sending to Rhunette Croft, NP for risk assessment or recommendations if patient needs to be seen in office prior to surgical procedure.

## 2023-02-02 DIAGNOSIS — M25551 Pain in right hip: Secondary | ICD-10-CM | POA: Diagnosis not present

## 2023-02-02 NOTE — Telephone Encounter (Signed)
Katie, [facility] is calling to check status of surgery clerarance Please see my msg below regarding this and advise when risk assessment is complete, thanks!

## 2023-02-03 ENCOUNTER — Other Ambulatory Visit: Payer: Self-pay | Admitting: Nurse Practitioner

## 2023-02-03 ENCOUNTER — Ambulatory Visit: Payer: PPO

## 2023-02-03 DIAGNOSIS — J90 Pleural effusion, not elsewhere classified: Secondary | ICD-10-CM

## 2023-02-03 NOTE — Telephone Encounter (Signed)
I called and spoke with the pt and notified of response per Florentina Addison  He verbalized understanding  Will hold in my basket until risk assessment done

## 2023-02-03 NOTE — Telephone Encounter (Signed)
Patient calling again. He is extremely upset and disappointed in our office for not handling this in a timely manner as his surgery is being delayed. Verlon Au, please call the patient today to let him know we are working on it. RA is also in office today if that speeds things along

## 2023-02-03 NOTE — Telephone Encounter (Signed)
I apologize. There was some confusion on my end regarding the request. I thought he was scheduled for an appointment for surgical risk assessment 12/13. He is going to need a repeat chest x ray prior to risk assessment being completed with his history of pleural effusion. He can come to the office to have this done; I've placed the order. Thanks.

## 2023-02-04 NOTE — Telephone Encounter (Signed)
Changed cxr order to be done at [facility] per pt request- he is aware

## 2023-02-05 ENCOUNTER — Ambulatory Visit (HOSPITAL_BASED_OUTPATIENT_CLINIC_OR_DEPARTMENT_OTHER): Payer: PPO

## 2023-02-05 ENCOUNTER — Other Ambulatory Visit: Payer: Self-pay | Admitting: Nurse Practitioner

## 2023-02-05 ENCOUNTER — Ambulatory Visit: Payer: PPO

## 2023-02-05 DIAGNOSIS — J90 Pleural effusion, not elsewhere classified: Secondary | ICD-10-CM | POA: Diagnosis not present

## 2023-02-05 DIAGNOSIS — R918 Other nonspecific abnormal finding of lung field: Secondary | ICD-10-CM | POA: Diagnosis not present

## 2023-02-05 NOTE — Telephone Encounter (Signed)
02/05/2023 Surgical Risk Assessment: Moderate to high risk. Factors that increase the risk for postoperative pulmonary complications are COPD, lung cancer, OSA, obesity, age.    Respiratory complications generally occur in 1% of ASA Class I patients, 5% of ASA Class II and 10% of ASA Class III-IV patients These complications rarely result in mortality and include postoperative pneumonia, atelectasis, pulmonary embolism, ARDS and increased time requiring postoperative mechanical ventilation.   Overall, I recommend proceeding with the surgery if the risk for respiratory complications are outweighed by the potential benefits. This will need to be discussed between the patient and surgeon.   To reduce risks of respiratory complications, I recommend: --Pre- and post-operative incentive spirometry performed frequently while awake --Inpatient use of currently prescribed Inspire/O2 for OSA whenever the patient is sleeping --Short duration of surgery as much as possible and avoid paralytic if possible  --OOB, encourage mobility post-op   1) RISK FOR PROLONGED MECHANICAL VENTILAION - > 48h  1A) [place] - Prolonged mech ventilation risk Aroz[place] Postperative Pulmonary Risk Score - for mech ventilation dependence >48h USAA, Ann Surg 2000, major non-cardiac surgery) Comment Score  Type of surgery - abd ao aneurysm (27), thoracic (21), neurosurgery / upper abdominal / vascular (21), neck (11) Left total hip arthroplasty under spinal 6  Emergency Surgery - (11)  0  ALbumin < 3 or poor nutritional state - (9) obesity 4  BUN > 30 -  (8)  0  Partial or completely dependent functional status - (7)  0  COPD -  (6) COPD 6  Age - 60 to 69 (4), > 70  (6) 77 6  TOTAL  22  Risk Stratifcation scores  - < 10 (0.5%), 11-19 (1.8%), 20-27 (4.2%), 28-40 (10.1%), >40 (26.6%)  4.2%

## 2023-02-08 DIAGNOSIS — M25551 Pain in right hip: Secondary | ICD-10-CM | POA: Diagnosis not present

## 2023-02-09 ENCOUNTER — Other Ambulatory Visit: Payer: PPO

## 2023-02-09 NOTE — Telephone Encounter (Addendum)
Copy of the risk assessment faxed to   [facility]- [facility] [phone]

## 2023-02-10 ENCOUNTER — Encounter: Payer: Self-pay | Admitting: Podiatry

## 2023-02-10 ENCOUNTER — Ambulatory Visit (INDEPENDENT_AMBULATORY_CARE_PROVIDER_SITE_OTHER): Payer: PPO | Admitting: Podiatry

## 2023-02-10 DIAGNOSIS — E1165 Type 2 diabetes mellitus with hyperglycemia: Secondary | ICD-10-CM

## 2023-02-10 DIAGNOSIS — M79676 Pain in unspecified toe(s): Secondary | ICD-10-CM | POA: Diagnosis not present

## 2023-02-10 DIAGNOSIS — B351 Tinea unguium: Secondary | ICD-10-CM | POA: Diagnosis not present

## 2023-02-10 DIAGNOSIS — D2371 Other benign neoplasm of skin of right lower limb, including hip: Secondary | ICD-10-CM

## 2023-02-10 DIAGNOSIS — D2372 Other benign neoplasm of skin of left lower limb, including hip: Secondary | ICD-10-CM | POA: Diagnosis not present

## 2023-02-10 NOTE — Progress Notes (Signed)
He presents today chief complaint of painful elongated toenails 1 through 5 bilaterally.  Objective: Pulses are palpable toenails are long thick yellow dystrophic clinical mycotic.  Multiple benign skin lesions plantar aspect of the bilateral foot no open lesions or wounds  Assessment: Pain limb secondary to onychomycosis and benign skin lesions  Plan: Debridement of toenails 1 through 5 bilateral debridement of all benign skin lesions.

## 2023-02-11 ENCOUNTER — Ambulatory Visit: Payer: PPO | Attending: Medical | Admitting: Medical

## 2023-02-11 ENCOUNTER — Encounter: Payer: Self-pay | Admitting: Medical

## 2023-02-11 ENCOUNTER — Other Ambulatory Visit: Payer: Self-pay

## 2023-02-11 ENCOUNTER — Other Ambulatory Visit (HOSPITAL_COMMUNITY): Payer: Self-pay

## 2023-02-11 VITALS — BP 102/64 | HR 63 | Ht 70.0 in | Wt 215.0 lb

## 2023-02-11 DIAGNOSIS — I2510 Atherosclerotic heart disease of native coronary artery without angina pectoris: Secondary | ICD-10-CM

## 2023-02-11 DIAGNOSIS — G4733 Obstructive sleep apnea (adult) (pediatric): Secondary | ICD-10-CM | POA: Diagnosis not present

## 2023-02-11 DIAGNOSIS — I10 Essential (primary) hypertension: Secondary | ICD-10-CM | POA: Diagnosis not present

## 2023-02-11 DIAGNOSIS — E782 Mixed hyperlipidemia: Secondary | ICD-10-CM

## 2023-02-11 DIAGNOSIS — I471 Supraventricular tachycardia, unspecified: Secondary | ICD-10-CM | POA: Diagnosis not present

## 2023-02-11 MED ORDER — ASPIRIN 81 MG PO TBEC: 81.0000 mg | DELAYED_RELEASE_TABLET | Freq: Every day | ORAL | Status: DC

## 2023-02-11 MED ORDER — NITROGLYCERIN 0.4 MG SL SUBL
0.4000 mg | SUBLINGUAL_TABLET | SUBLINGUAL | 2 refills | Status: AC | PRN
  Filled 2023-02-11: qty 25, 8d supply, fill #0
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
## 2023-02-11 NOTE — Patient Instructions (Addendum)
Medication Instructions:  Your physician recommends the following medication changes.  START TAKING: Aspirin 81 mg daily  Nitroglycerin Sublingual (NITROSTAT) 0.4 mg as needed for chest pain - take one under the tongue every 5 min while chest pain persists for a max dose of 3 and call 911  *If you need a refill on your cardiac medications before your next appointment, please call your pharmacy*  Lab Work: None ordered at this time   Follow-Up: At [facility], you and your health needs are our priority.  As part of our continuing mission to provide you with exceptional heart care, we have created designated Provider Care Teams.  These Care Teams include your primary Cardiologist (physician) and Advanced Practice Providers (APPs -  Physician Assistants and Nurse Practitioners) who all work together to provide you with the care you need, when you need it.  We recommend signing up for the patient portal called "MyChart".  Sign up information is provided on this After Visit Summary.  MyChart is used to connect with patients for Virtual Visits (Telemedicine).  Patients are able to view lab/test results, encounter notes, upcoming appointments, etc.  Non-urgent messages can be sent to your provider as well.   To learn more about what you can do with MyChart, go to [email or web address].    Your next appointment:   6 month(s)  Provider:   You may see Lorine Bears, MD or one of the following Advanced Practice Providers on your designated Care Team:   Nicolasa Ducking, NP Eula Listen, PA-C Cadence Fransico Michael, PA-C Charlsie Quest, NP Carlos Levering, NP

## 2023-02-12 DIAGNOSIS — M25551 Pain in right hip: Secondary | ICD-10-CM | POA: Diagnosis not present

## 2023-02-15 ENCOUNTER — Telehealth: Payer: Self-pay | Admitting: Medical

## 2023-02-15 ENCOUNTER — Other Ambulatory Visit: Payer: Self-pay

## 2023-02-15 ENCOUNTER — Other Ambulatory Visit (HOSPITAL_COMMUNITY): Payer: Self-pay

## 2023-02-15 NOTE — Telephone Encounter (Signed)
   Pre-operative Risk Assessment    Patient Name: Howard Maxwell  DOB: April 07, 1945 MRN: [id number]      Request for Surgical Clearance    Procedure:  left total knee arthroplasty  Date of Surgery:  Clearance TBD                                 Surgeon:  Samson Frederic, MD Surgeon's Group or Practice Name:  [facility] Phone number:  [phone] Fax number:  [phone]   Type of Clearance Requested:   - Medical    Type of Anesthesia:  Spinal   Additional requests/questions:    Signed, Narda Amber   02/15/2023, 11:11 AM

## 2023-02-16 ENCOUNTER — Ambulatory Visit: Payer: PPO | Admitting: Internal Medicine

## 2023-02-16 ENCOUNTER — Other Ambulatory Visit (HOSPITAL_COMMUNITY): Payer: Self-pay

## 2023-02-16 ENCOUNTER — Other Ambulatory Visit: Payer: Self-pay

## 2023-02-16 ENCOUNTER — Encounter (HOSPITAL_COMMUNITY): Payer: Self-pay

## 2023-02-16 DIAGNOSIS — M25551 Pain in right hip: Secondary | ICD-10-CM | POA: Diagnosis not present

## 2023-02-16 MED ORDER — METFORMIN HCL 850 MG PO TABS
850.0000 mg | ORAL_TABLET | Freq: Two times a day (BID) | ORAL | 1 refills | Status: DC
  Filled 2023-02-16: qty 180, 90d supply, fill #0

## 2023-02-16 MED ORDER — OMEPRAZOLE 40 MG PO CPDR
40.0000 mg | DELAYED_RELEASE_CAPSULE | Freq: Every day | ORAL | 1 refills | Status: DC
  Filled 2023-02-16: qty 90, 90d supply, fill #0
  Filled 2023-05-13: qty 90, 90d supply, fill #1

## 2023-02-16 MED ORDER — ATORVASTATIN CALCIUM 40 MG PO TABS
40.0000 mg | ORAL_TABLET | Freq: Every day | ORAL | 1 refills | Status: DC
  Filled 2023-02-16 – 2023-03-09 (×2): qty 90, 90d supply, fill #0
  Filled 2023-05-13: qty 90, 90d supply, fill #1

## 2023-02-17 NOTE — Telephone Encounter (Signed)
     Primary Cardiologist: Lorine Bears, MD  Chart reviewed as part of pre-operative protocol coverage. Given past medical history and time since last visit, based on ACC/AHA guidelines, OAKLEN GALLIHUGH would be at acceptable risk for the planned procedure without further cardiovascular testing.   His RCRI is moderate risk, 6.6% risk of major cardiac event.  He completed nuclear stress testing 04/20/2022 which showed low risk and no ST deviation.  His EF was noted to be greater than 65% and his diastolic cavity was noted to be mildly enlarged.  His aspirin may be held for 7 days prior to his procedure if needed.  Please resume as soon as hemostasis is achieved.  I will route this recommendation to the requesting party via Epic fax function and remove from pre-op pool.  Please call with questions.  Thomasene Ripple. Hadiyah Maricle NP-C     02/17/2023, 10:38 AM [facility] [address] Office [phone] Fax [phone]

## 2023-02-23 DIAGNOSIS — M25551 Pain in right hip: Secondary | ICD-10-CM | POA: Diagnosis not present

## 2023-02-26 DIAGNOSIS — M25551 Pain in right hip: Secondary | ICD-10-CM | POA: Diagnosis not present

## 2023-03-02 DIAGNOSIS — M25551 Pain in right hip: Secondary | ICD-10-CM | POA: Diagnosis not present

## 2023-03-05 ENCOUNTER — Ambulatory Visit (HOSPITAL_BASED_OUTPATIENT_CLINIC_OR_DEPARTMENT_OTHER): Payer: PPO | Admitting: Pulmonary Disease

## 2023-03-05 ENCOUNTER — Encounter (HOSPITAL_BASED_OUTPATIENT_CLINIC_OR_DEPARTMENT_OTHER): Payer: Self-pay | Admitting: Pulmonary Disease

## 2023-03-05 VITALS — BP 120/70 | HR 60 | Ht 70.0 in | Wt 217.9 lb

## 2023-03-05 DIAGNOSIS — G4734 Idiopathic sleep related nonobstructive alveolar hypoventilation: Secondary | ICD-10-CM

## 2023-03-05 DIAGNOSIS — J90 Pleural effusion, not elsewhere classified: Secondary | ICD-10-CM | POA: Diagnosis not present

## 2023-03-05 DIAGNOSIS — G4733 Obstructive sleep apnea (adult) (pediatric): Secondary | ICD-10-CM | POA: Diagnosis not present

## 2023-03-05 DIAGNOSIS — J4489 Other specified chronic obstructive pulmonary disease: Secondary | ICD-10-CM

## 2023-03-05 DIAGNOSIS — J439 Emphysema, unspecified: Secondary | ICD-10-CM

## 2023-03-05 DIAGNOSIS — M25551 Pain in right hip: Secondary | ICD-10-CM | POA: Diagnosis not present
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
## 2023-03-05 NOTE — Patient Instructions (Signed)
Inspire device was checked today, seems to be working well at 2.1 V/level 7.  CT chest per Dr. Shirline Frees  Continue on Anoro. Continue on 2 L oxygen during sleep

## 2023-03-08 ENCOUNTER — Other Ambulatory Visit: Payer: Self-pay | Admitting: Cardiovascular Disease

## 2023-03-09 ENCOUNTER — Other Ambulatory Visit: Payer: Self-pay

## 2023-03-09 ENCOUNTER — Other Ambulatory Visit: Payer: Self-pay | Admitting: Cardiovascular Disease

## 2023-03-10 ENCOUNTER — Other Ambulatory Visit: Payer: Self-pay

## 2023-03-10 ENCOUNTER — Other Ambulatory Visit (HOSPITAL_COMMUNITY): Payer: Self-pay

## 2023-03-10 DIAGNOSIS — G4733 Obstructive sleep apnea (adult) (pediatric): Secondary | ICD-10-CM | POA: Diagnosis not present

## 2023-03-10 DIAGNOSIS — Z4542 Encounter for adjustment and management of neuropacemaker (brain) (peripheral nerve) (spinal cord): Secondary | ICD-10-CM | POA: Diagnosis not present

## 2023-03-10 DIAGNOSIS — G4710 Hypersomnia, unspecified: Secondary | ICD-10-CM | POA: Diagnosis not present

## 2023-03-10 DIAGNOSIS — J449 Chronic obstructive pulmonary disease, unspecified: Secondary | ICD-10-CM | POA: Diagnosis not present

## 2023-03-10 DIAGNOSIS — Z9989 Dependence on other enabling machines and devices: Secondary | ICD-10-CM | POA: Diagnosis not present

## 2023-03-10 DIAGNOSIS — M7542 Impingement syndrome of left shoulder: Secondary | ICD-10-CM | POA: Diagnosis not present

## 2023-03-10 DIAGNOSIS — G4734 Idiopathic sleep related nonobstructive alveolar hypoventilation: Secondary | ICD-10-CM | POA: Diagnosis not present

## 2023-03-10 MED ORDER — LOSARTAN POTASSIUM 50 MG PO TABS
50.0000 mg | ORAL_TABLET | Freq: Every day | ORAL | 2 refills | Status: DC
  Filled 2023-03-10: qty 90, 90d supply, fill #0
  Filled 2023-05-13: qty 90, 90d supply, fill #1
  Filled 2023-09-18: qty 90, 90d supply, fill #2

## 2023-03-10 MED ORDER — SPIRONOLACTONE 25 MG PO TABS
25.0000 mg | ORAL_TABLET | Freq: Every day | ORAL | 2 refills | Status: DC
  Filled 2023-03-10: qty 90, 90d supply, fill #0
  Filled 2023-05-13: qty 90, 90d supply, fill #1
  Filled 2023-06-05 – 2023-09-18 (×2): qty 90, 90d supply, fill #2

## 2023-03-12 DIAGNOSIS — M25551 Pain in right hip: Secondary | ICD-10-CM | POA: Diagnosis not present

## 2023-03-16 DIAGNOSIS — M25551 Pain in right hip: Secondary | ICD-10-CM | POA: Diagnosis not present

## 2023-03-19 DIAGNOSIS — E1165 Type 2 diabetes mellitus with hyperglycemia: Secondary | ICD-10-CM | POA: Diagnosis not present

## 2023-03-19 DIAGNOSIS — M25551 Pain in right hip: Secondary | ICD-10-CM | POA: Diagnosis not present

## 2023-03-19 DIAGNOSIS — I10 Essential (primary) hypertension: Secondary | ICD-10-CM | POA: Diagnosis not present

## 2023-03-25 ENCOUNTER — Other Ambulatory Visit (HOSPITAL_COMMUNITY): Payer: Self-pay

## 2023-03-25 ENCOUNTER — Other Ambulatory Visit (HOSPITAL_BASED_OUTPATIENT_CLINIC_OR_DEPARTMENT_OTHER): Payer: Self-pay

## 2023-03-25 DIAGNOSIS — Z902 Acquired absence of lung [part of]: Secondary | ICD-10-CM | POA: Diagnosis not present

## 2023-03-25 DIAGNOSIS — I700 Atherosclerosis of aorta: Secondary | ICD-10-CM | POA: Diagnosis not present

## 2023-03-25 DIAGNOSIS — C3491 Malignant neoplasm of unspecified part of right bronchus or lung: Secondary | ICD-10-CM | POA: Diagnosis not present

## 2023-03-25 DIAGNOSIS — M545 Low back pain, unspecified: Secondary | ICD-10-CM | POA: Diagnosis not present

## 2023-03-25 DIAGNOSIS — R61 Generalized hyperhidrosis: Secondary | ICD-10-CM | POA: Diagnosis not present

## 2023-03-25 DIAGNOSIS — Z6830 Body mass index (BMI) 30.0-30.9, adult: Secondary | ICD-10-CM | POA: Diagnosis not present

## 2023-03-25 DIAGNOSIS — Z87891 Personal history of nicotine dependence: Secondary | ICD-10-CM | POA: Diagnosis not present

## 2023-03-25 DIAGNOSIS — Z0000 Encounter for general adult medical examination without abnormal findings: Secondary | ICD-10-CM | POA: Diagnosis not present

## 2023-03-25 DIAGNOSIS — J438 Other emphysema: Secondary | ICD-10-CM | POA: Diagnosis not present

## 2023-03-25 DIAGNOSIS — M4807 Spinal stenosis, lumbosacral region: Secondary | ICD-10-CM | POA: Diagnosis not present

## 2023-03-25 DIAGNOSIS — E1165 Type 2 diabetes mellitus with hyperglycemia: Secondary | ICD-10-CM | POA: Diagnosis not present

## 2023-03-25 DIAGNOSIS — F330 Major depressive disorder, recurrent, mild: Secondary | ICD-10-CM | POA: Diagnosis not present

## 2023-03-25 DIAGNOSIS — I2510 Atherosclerotic heart disease of native coronary artery without angina pectoris: Secondary | ICD-10-CM | POA: Diagnosis not present

## 2023-03-25 MED ORDER — METFORMIN HCL 500 MG PO TABS
500.0000 mg | ORAL_TABLET | Freq: Two times a day (BID) | ORAL | 3 refills | Status: DC
  Filled 2023-03-25: qty 180, 90d supply, fill #0
  Filled 2023-06-22: qty 180, 90d supply, fill #1

## 2023-03-26 ENCOUNTER — Other Ambulatory Visit (HOSPITAL_COMMUNITY): Payer: Self-pay

## 2023-03-26 ENCOUNTER — Encounter (HOSPITAL_COMMUNITY): Payer: Self-pay

## 2023-04-16 DIAGNOSIS — J3489 Other specified disorders of nose and nasal sinuses: Secondary | ICD-10-CM | POA: Diagnosis not present

## 2023-04-29 ENCOUNTER — Ambulatory Visit (HOSPITAL_BASED_OUTPATIENT_CLINIC_OR_DEPARTMENT_OTHER): Payer: PPO | Admitting: Pulmonary Disease

## 2023-05-07 ENCOUNTER — Telehealth: Payer: Self-pay | Admitting: Internal Medicine

## 2023-05-07 DIAGNOSIS — D1030 Benign neoplasm of unspecified part of mouth: Secondary | ICD-10-CM | POA: Diagnosis not present

## 2023-05-07 DIAGNOSIS — J3489 Other specified disorders of nose and nasal sinuses: Secondary | ICD-10-CM | POA: Diagnosis not present

## 2023-05-07 NOTE — Telephone Encounter (Signed)
[facility] Clinical Research Coordinator note:   This visit for Subject Howard Maxwell with DOB: 05-27-1945 on 05/07/2023. Subject contacted regarding [id number] to inform that Principal Investigator has changed to Dr. Delton Coombes. Patient expressed understanding.

## 2023-05-10 ENCOUNTER — Ambulatory Visit (HOSPITAL_COMMUNITY)
Admission: RE | Admit: 2023-05-10 | Discharge: 2023-05-10 | Disposition: A | Discharge status: Home / Self Care | Payer: PPO | Source: Ambulatory Visit | Attending: Physician Assistant | Admitting: Physician Assistant

## 2023-05-10 ENCOUNTER — Other Ambulatory Visit (HOSPITAL_COMMUNITY): Payer: Self-pay

## 2023-05-10 ENCOUNTER — Inpatient Hospital Stay: Payer: PPO | Attending: Internal Medicine

## 2023-05-10 DIAGNOSIS — C3490 Malignant neoplasm of unspecified part of unspecified bronchus or lung: Secondary | ICD-10-CM | POA: Insufficient documentation

## 2023-05-10 DIAGNOSIS — M7072 Other bursitis of hip, left hip: Secondary | ICD-10-CM | POA: Diagnosis not present

## 2023-05-10 DIAGNOSIS — J90 Pleural effusion, not elsewhere classified: Secondary | ICD-10-CM | POA: Diagnosis not present

## 2023-05-10 DIAGNOSIS — M25511 Pain in right shoulder: Secondary | ICD-10-CM | POA: Insufficient documentation

## 2023-05-10 DIAGNOSIS — I700 Atherosclerosis of aorta: Secondary | ICD-10-CM | POA: Diagnosis not present

## 2023-05-10 DIAGNOSIS — Z902 Acquired absence of lung [part of]: Secondary | ICD-10-CM | POA: Diagnosis not present

## 2023-05-10 DIAGNOSIS — M7071 Other bursitis of hip, right hip: Secondary | ICD-10-CM | POA: Insufficient documentation

## 2023-05-10 DIAGNOSIS — M25512 Pain in left shoulder: Secondary | ICD-10-CM | POA: Insufficient documentation

## 2023-05-10 DIAGNOSIS — Z85118 Personal history of other malignant neoplasm of bronchus and lung: Secondary | ICD-10-CM | POA: Insufficient documentation

## 2023-05-10 LAB — CBC WITH DIFFERENTIAL (CANCER CENTER ONLY)
Abs Immature Granulocytes: 0.04 10*3/uL (ref 0.00–0.07)
Basophils Absolute: 0.1 10*3/uL (ref 0.0–0.1)
Basophils Relative: 1 %
Eosinophils Absolute: 0.2 10*3/uL (ref 0.0–0.5)
Eosinophils Relative: 2 %
HCT: 46.7 % (ref 39.0–52.0)
Hemoglobin: 15.2 g/dL (ref 13.0–17.0)
Immature Granulocytes: 0 %
Lymphocytes Relative: 11 %
Lymphs Abs: 1.3 10*3/uL (ref 0.7–4.0)
MCH: 31.3 pg (ref 26.0–34.0)
MCHC: 32.5 g/dL (ref 30.0–36.0)
MCV: 96.1 fL (ref 80.0–100.0)
Monocytes Absolute: 0.7 10*3/uL (ref 0.1–1.0)
Monocytes Relative: 6 %
Neutro Abs: 8.8 10*3/uL — ABNORMAL HIGH (ref 1.7–7.7)
Neutrophils Relative %: 80 %
Platelet Count: 192 10*3/uL (ref 150–400)
RBC: 4.86 MIL/uL (ref 4.22–5.81)
RDW: 13.8 % (ref 11.5–15.5)
WBC Count: 11.1 10*3/uL — ABNORMAL HIGH (ref 4.0–10.5)
nRBC: 0 % (ref 0.0–0.2)

## 2023-05-10 LAB — CMP (CANCER CENTER ONLY)
ALT: 15 U/L (ref 0–44)
AST: 13 U/L — ABNORMAL LOW (ref 15–41)
Albumin: 4.3 g/dL (ref 3.5–5.0)
Alkaline Phosphatase: 68 U/L (ref 38–126)
Anion gap: 6 (ref 5–15)
BUN: 10 mg/dL (ref 8–23)
CO2: 32 mmol/L (ref 22–32)
Calcium: 9.6 mg/dL (ref 8.9–10.3)
Chloride: 101 mmol/L (ref 98–111)
Creatinine: 0.66 mg/dL (ref 0.61–1.24)
GFR, Estimated: >60 mL/min (ref >60)
Glucose, Bld: 164 mg/dL — ABNORMAL HIGH (ref 70–99)
Potassium: 4.5 mmol/L (ref 3.5–5.1)
Sodium: 139 mmol/L (ref 135–145)
Total Bilirubin: 0.7 mg/dL (ref 0.0–1.2)
Total Protein: 6.8 g/dL (ref 6.5–8.1)

## 2023-05-10 MED ORDER — IOHEXOL 300 MG/ML  SOLN
75.0000 mL | Freq: Once | INTRAMUSCULAR | Status: AC | PRN
  Administered 2023-05-10: 75 mL via INTRAVENOUS

## 2023-05-10 MED ORDER — GLIPIZIDE ER 10 MG PO TB24
10.0000 mg | ORAL_TABLET | Freq: Every day | ORAL | 1 refills | Status: DC
Start: 2023-05-10 — End: 2023-11-23
  Filled 2023-05-10: qty 90, 90d supply, fill #0
  Filled 2023-08-29: qty 90, 90d supply, fill #1

## 2023-05-11 ENCOUNTER — Other Ambulatory Visit (HOSPITAL_COMMUNITY): Payer: Self-pay

## 2023-05-11 ENCOUNTER — Telehealth: Payer: Self-pay | Admitting: Cardiovascular Disease

## 2023-05-11 NOTE — Telephone Encounter (Signed)
   Pre-operative Risk Assessment    Patient Name: Howard Maxwell  DOB: 12-12-1945 MRN: [id number]   Date of last office visit: unknown Date of next office visit: unknown   Request for Surgical Clearance    Procedure:   left total knee arthroplasty  Date of Surgery:  Clearance TBD                                Surgeon:  Dr. Rosalita Levan Surgeon's Group or Practice Name:  [facility] Phone number:  [phone] Fax number:  [phone]   Type of Clearance Requested:   - Medical    Type of Anesthesia:  Spinal   Additional requests/questions:    SignedShawna Orleans   05/11/2023, 11:12 AM

## 2023-05-12 ENCOUNTER — Ambulatory Visit: Payer: PPO | Admitting: Podiatry

## 2023-05-12 NOTE — Telephone Encounter (Signed)
   Name: Howard Maxwell  DOB: 01/15/1946  MRN: [id number]  Primary Cardiologist: Lorine Bears, MD  Chart reviewed as part of pre-operative protocol coverage. Because of [name]'s past medical history and time since last visit, he will require a follow-up in-office visit in order to better assess preoperative cardiovascular risk.  Pre-op covering staff: - Please schedule appointment and call patient to inform them. If patient already had an upcoming appointment within acceptable timeframe, please add "pre-op clearance" to the appointment notes so provider is aware. - Please contact requesting surgeon's office via preferred method (i.e, phone, fax) to inform them of need for appointment prior to surgery.  No medications indicated as needing held.  Sharlene Dory, PA-C  05/12/2023, 10:40 AM

## 2023-05-13 ENCOUNTER — Other Ambulatory Visit (HOSPITAL_COMMUNITY): Payer: Self-pay

## 2023-05-13 NOTE — Telephone Encounter (Signed)
Called patient to set up a in-person office, no answer left a vm

## 2023-05-14 MED ORDER — GLUCOSE BLOOD VI STRP
ORAL_STRIP | 3 refills | Status: AC
  Filled 2023-05-14: qty 200, 100d supply, fill #0
  Filled 2023-09-18: qty 200, 100d supply, fill #1
  Filled 2023-12-25: qty 200, 100d supply, fill #2

## 2023-05-14 NOTE — Telephone Encounter (Signed)
Called patient to set up a in-person office, no answer left a vm

## 2023-05-14 NOTE — Telephone Encounter (Signed)
Patient called back and stated that he called to make an appt for 03/05 for his pre-op already.

## 2023-05-15 ENCOUNTER — Other Ambulatory Visit (HOSPITAL_COMMUNITY): Payer: Self-pay

## 2023-05-17 ENCOUNTER — Other Ambulatory Visit: Payer: Self-pay

## 2023-05-17 ENCOUNTER — Inpatient Hospital Stay: Payer: PPO | Admitting: Internal Medicine

## 2023-05-17 ENCOUNTER — Telehealth: Payer: Self-pay | Admitting: Pulmonary Disease

## 2023-05-17 VITALS — BP 137/66 | HR 60 | Temp 97.7°F | Resp 17 | Ht 70.0 in | Wt 230.4 lb

## 2023-05-17 DIAGNOSIS — C3490 Malignant neoplasm of unspecified part of unspecified bronchus or lung: Secondary | ICD-10-CM | POA: Diagnosis not present

## 2023-05-17 NOTE — Telephone Encounter (Signed)
 Fax received from Dr. Samson Frederic with [facility] to perform a left total knee arthroplasty on patient under spinal anesthesia.  Patient needs surgery clearance. Surgery is pending. Patient was seen on 03/05/23. Office protocol is a risk assessment can be sent to surgeon if patient has been seen in 60 days or less.   Sending to Dr Vassie Loll for risk assessment or recommendations if patient needs to be seen in office prior to surgical procedure.
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
## 2023-05-21 NOTE — Telephone Encounter (Signed)
 Oretha Milch, MD to [facility]     05/17/23  4:57 PM He is cleared for knee surgery with due risk Routine perioperative Precautions for COPD and sleep apnea Uses nocturnal oxygen He has an inspire device for OSA  Copy of this encounter was faxed to [facility].

## 2023-05-26 ENCOUNTER — Ambulatory Visit: Payer: PPO | Attending: Cardiology | Admitting: Cardiology

## 2023-05-26 ENCOUNTER — Encounter: Payer: Self-pay | Admitting: Cardiology

## 2023-05-26 VITALS — BP 126/70 | HR 81 | Ht 70.0 in | Wt 229.2 lb

## 2023-05-26 DIAGNOSIS — I471 Supraventricular tachycardia, unspecified: Secondary | ICD-10-CM

## 2023-05-26 DIAGNOSIS — R0609 Other forms of dyspnea: Secondary | ICD-10-CM | POA: Diagnosis not present

## 2023-05-26 DIAGNOSIS — E782 Mixed hyperlipidemia: Secondary | ICD-10-CM

## 2023-05-26 DIAGNOSIS — I10 Essential (primary) hypertension: Secondary | ICD-10-CM | POA: Diagnosis not present

## 2023-05-26 DIAGNOSIS — G4733 Obstructive sleep apnea (adult) (pediatric): Secondary | ICD-10-CM | POA: Diagnosis not present

## 2023-05-26 DIAGNOSIS — J439 Emphysema, unspecified: Secondary | ICD-10-CM

## 2023-05-26 DIAGNOSIS — I2510 Atherosclerotic heart disease of native coronary artery without angina pectoris: Secondary | ICD-10-CM | POA: Diagnosis not present

## 2023-05-26 DIAGNOSIS — Z01810 Encounter for preprocedural cardiovascular examination: Secondary | ICD-10-CM

## 2023-05-26 DIAGNOSIS — J4489 Other specified chronic obstructive pulmonary disease: Secondary | ICD-10-CM

## 2023-05-26 NOTE — Progress Notes (Signed)
 Cardiology Office Note:  .   Date:  05/26/2023  ID:  Howard Maxwell, DOB 07/10/1945, MRN [id number] PCP: Barbette Reichmann, MD  [facility] Providers Cardiologist:  Lorine Bears, MD    History of Present Illness: .   Howard Maxwell is a 78 y.o. 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   Patient presented September 2015 with unstable angina.  Cardiac cath showed an occluded mid RCA which was medium in size and codominant with faint collaterals.  Normal ejection fraction.  Patient was treated medically.  Patient quit smoking at this time.  During cardiac catheterization, he had an episode of suspected SVT.  Patient has a history of severe iron deficiency anemia in 2015 due to GI blood loss. He had implantation of hypoglossal nerve stimulator for sleep apnea in April 2022.  He had an echocardiogram done in May 2022 which showed normal LVSF with no significant valvular abnormalities.  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   He was last seen in clinic 02/11/2023.  At that time he was doing well from the cardiac perspective.  Blood pressure been well-controlled.  He was also needing a knee replacement and dental surgery.  There were no changes made to his medications or further testing that was required.  He returns to clinic today stating that from the cardiac perspective that he has been doing well.  Unfortunately he says he has arthritis that is limiting his function.  His lifestyle has become more sedentary.  He is walking with a rollator today.  He also states that he needs clearance to have knee replacement surgery completed even though he is not sure if he is going to surgical procedure done.  He does note  some dyspnea on exertion but denies any chest discomfort, abdominal swelling, peripheral edema, lightheadedness or dizziness.  Does have concerns today of increased amount of urination with the spironolactone and has been placed on Myrbetriq to help with his overactive bladder and has questions of coming off of spironolactone today.  He states that he has been compliant with his current medication regimen.  Denies any hospitalizations or emergency department visits.  ROS: 10 point review of system has been reviewed and considered negative with exception of what is been listed in the HPI  Studies Reviewed: Marland Kitchen   EKG Interpretation Date/Time:  Wednesday May 26 2023 10:39:57 EST Ventricular Rate:  81 PR Interval:  336 QRS Duration:  84 QT Interval:  358 QTC Calculation: 415 R Axis:   -25  Text Interpretation: Sinus rhythm with 1st degree A-V block Inferior infarct (cited on or before 11-Feb-2023) When compared with ECG of 11-Feb-2023 09:06, No significant change was found Confirmed by Charlsie Quest ([id number]) on 05/26/2023 10:41:11 AM    Myoview lexiscan 03/2022   The study is normal. The study is low risk.   No ST deviation was noted.   Left ventricular function is normal. Nuclear stress EF: 68 %. The left ventricular ejection fraction is hyperdynamic (>65%). End diastolic cavity size is mildly enlarged.   Prior study not available for comparison.   Heart monitor 11/2021 Patch Wear Time:  0 days and 19 hours (2023-08-31T14:18:25-0400 to 2023-09-01T09:44:59-0400)   Patient had a min HR of 50 bpm, max HR of 108  bpm, and avg HR of 74 bpm. Predominant underlying rhythm was Sinus Rhythm. First Degree AV Block was present.  Rare PACs and rare PVCs.   Echo 07/2020 1. Left ventricular ejection fraction, by estimation, is 55 to 60%. The  left ventricle has normal function. The left ventricle has no regional  wall motion abnormalities. Left ventricular diastolic parameters were  normal.   2. Right  ventricular systolic function is normal. The right ventricular  size is normal.   3. The mitral valve is normal in structure. No evidence of mitral valve  regurgitation.   4. The aortic valve is tricuspid. Aortic valve regurgitation is not  visualized.   5. Aortic dilatation noted. There is mild dilatation of the ascending  aorta, measuring 40 mm.   6. The inferior vena cava is normal in size with greater than 50%  respiratory variability, suggesting right atrial pressure of 3 mmHg.  Risk Assessment/Calculations:             Physical Exam:   VS:  BP 126/70 (BP Location: Left Arm, Patient Position: Sitting, Cuff Size: Normal)   Pulse 81   Ht 5\' 10"  (1.778 m)   Wt 229 lb 4 oz (104 kg)   SpO2 92%   BMI 32.89 kg/m    Wt Readings from Last 3 Encounters:  05/26/23 229 lb 4 oz (104 kg)  05/17/23 230 lb 6.4 oz (104.5 kg)  03/05/23 217 lb 14.4 oz (98.8 kg)    GEN: Well nourished, well developed in no acute distress NECK: No JVD; No carotid bruits CARDIAC: RRR, no murmurs, rubs, gallops RESPIRATORY:  Clear to auscultation without rales, wheezing or rhonchi  ABDOMEN: Soft, non-tender, non-distended EXTREMITIES:  No edema; No deformity   ASSESSMENT AND PLAN: .   Coronary artery disease with no complaints of chest pain.  Last heart catheterization was completed in 2015 which showed an occluded mid RCA with faint collaterals.  Most recent ischemic evaluation was done in 03/2022 we had a Lexiscan Myoview which was considered a low risk study.  EKG today reveals sinus rhythm with a first-degree AV block with a rate of 81 bpm, no ischemic changes noted.  He has been continued on aspirin 81 mg daily, atorvastatin 40 mg daily and Nitrostat as needed which she has not required the use of.  Dyspnea that has been gradual likely related to sedentary lifestyle. Last echocardiogram completed in 2022, scheduled for repeat study today to reassess function.  Primary hypertension with a blood pressure today  126/70 blood pressures remain stable.  He is continued on carvedilol 6.25 mg twice daily, losartan 50 mg daily, and spironolactone which we are decreasing from 25 mg daily to 12.5 mg daily   Hyperlipidemia with LDL 46 in 02/2023. Remains at goal. Continued on atorvastatin 40 mg daily.  Paroxysmal SVT that has been quiescent. Continued on carvedilol 6.25 mg twice daily.  Obstructive sleep apnea status post inspire implant  Status post lobectomy from lung cancer with COPD on chronic oxygen 3 L via nasal cannula at night.  Preoperative cardiovascular examination    Mr. Coger's perioperative risk of a major cardiac event is 0.9% according to the Revised Cardiac Risk Index (RCRI).  Therefore, he is at high risk for perioperative complications.   His functional capacity is fair at 4.64 METs according to the Duke Activity Status Index (DASI). Recommendations: The patient requires an echocardiogram before a disposition can be made regarding surgical risk.  Dispo: Patient to return to clinic to see MD/APP in 3 months or sooner if needed for further evaluation.  If echocardiogram is unchanged he can continue with surgery as previously scheduled at acceptable risk.  Signed, Satsuki Zillmer, NP

## 2023-05-26 NOTE — Patient Instructions (Signed)
 Medication Instructions:  Your physician recommends the following medication changes.   DECREASE:  Spironolactone (ALDCTONE) 12.5 DAILY   *If you need a refill on your cardiac medications before your next appointment, please call your pharmacy*  Lab Work:  No labs ordered today  If you have labs (blood work) drawn today and your tests are completely normal, you will receive your results only by: MyChart Message (if you have MyChart) OR A paper copy in the mail If you have any lab test that is abnormal or we need to change your treatment, we will call you to review the results.   Testing/Procedures: Your physician has requested that you have an echocardiogram. Echocardiography is a painless test that uses sound waves to create images of your heart. It provides your doctor with information about the size and shape of your heart and how well your heart's chambers and valves are working.   You may receive an ultrasound enhancing agent through an IV if needed to better visualize your heart during the echo. This procedure takes approximately one hour.  There are no restrictions for this procedure.  This will take place at [address] [facility] [address], [place] [address]  Please note: We ask at that you not bring children with you during ultrasound (echo/ vascular) testing. Due to room size and safety concerns, children are not allowed in the ultrasound rooms during exams. Our front office staff cannot provide observation of children in our lobby area while testing is being conducted. An adult accompanying a patient to their appointment will only be allowed in the ultrasound room at the discretion of the ultrasound technician under special circumstances. We apologize for any inconvenience.    Follow-Up: At [facility], you and your health needs are our priority.  As part of our continuing mission to provide you with exceptional heart care, we have created  designated Provider Care Teams.  These Care Teams include your primary Cardiologist (physician) and Advanced Practice Providers (APPs -  Physician Assistants and Nurse Practitioners) who all work together to provide you with the care you need, when you need it.   Your next appointment:   5-6 month(s)  Provider:   You will see one of the following Advanced Practice Providers on your designated Care Team:   Nicolasa Ducking, NP Eula Listen, PA-C Cadence Fransico Michael, PA-C Charlsie Quest, NP Carlos Levering, NP

## 2023-05-27 DIAGNOSIS — Z961 Presence of intraocular lens: Secondary | ICD-10-CM | POA: Diagnosis not present

## 2023-05-27 DIAGNOSIS — E119 Type 2 diabetes mellitus without complications: Secondary | ICD-10-CM | POA: Diagnosis not present

## 2023-05-27 DIAGNOSIS — D3131 Benign neoplasm of right choroid: Secondary | ICD-10-CM | POA: Diagnosis not present

## 2023-05-31 DIAGNOSIS — J018 Other acute sinusitis: Secondary | ICD-10-CM | POA: Diagnosis not present

## 2023-06-05 ENCOUNTER — Encounter (HOSPITAL_COMMUNITY): Payer: Self-pay | Admitting: Pharmacist

## 2023-06-05 ENCOUNTER — Other Ambulatory Visit (HOSPITAL_COMMUNITY): Payer: Self-pay

## 2023-06-07 ENCOUNTER — Other Ambulatory Visit (HOSPITAL_COMMUNITY): Payer: Self-pay

## 2023-06-07 MED ORDER — FLUTICASONE PROPIONATE 50 MCG/ACT NA SUSP
2.0000 | Freq: Every day | NASAL | 9 refills | Status: DC
  Filled 2023-06-07: qty 16, 30d supply, fill #0
  Filled 2023-08-29: qty 16, 30d supply, fill #1
  Filled 2023-09-18 – 2023-09-22 (×2): qty 16, 30d supply, fill #2
  Filled 2023-10-27: qty 16, 30d supply, fill #3
  Filled 2023-11-26: qty 16, 30d supply, fill #4

## 2023-06-07 MED ORDER — TRAZODONE HCL 50 MG PO TABS
50.0000 mg | ORAL_TABLET | Freq: Every day | ORAL | 0 refills | Status: DC
  Filled 2023-06-07: qty 90, 90d supply, fill #0

## 2023-06-07 MED ORDER — IPRATROPIUM BROMIDE 0.06 % NA SOLN
2.0000 | Freq: Three times a day (TID) | NASAL | 3 refills | Status: AC | PRN
  Filled 2023-06-07: qty 15, 25d supply, fill #0
  Filled 2023-09-18: qty 15, 25d supply, fill #1

## 2023-06-07 MED FILL — Carvedilol Tab 6.25 MG: ORAL | 90 days supply | Qty: 180 | Fill #0 | Status: AC

## 2023-06-08 ENCOUNTER — Other Ambulatory Visit (HOSPITAL_COMMUNITY): Payer: Self-pay

## 2023-06-08 MED ORDER — EMPAGLIFLOZIN 10 MG PO TABS
10.0000 mg | ORAL_TABLET | Freq: Every day | ORAL | 2 refills | Status: DC
  Filled 2023-06-08 (×2): qty 90, 90d supply, fill #0
  Filled 2023-08-29: qty 90, 90d supply, fill #1
  Filled 2023-11-26: qty 90, 90d supply, fill #2

## 2023-06-09 ENCOUNTER — Ambulatory Visit (INDEPENDENT_AMBULATORY_CARE_PROVIDER_SITE_OTHER): Payer: PPO | Admitting: Podiatry

## 2023-06-09 DIAGNOSIS — D2372 Other benign neoplasm of skin of left lower limb, including hip: Secondary | ICD-10-CM

## 2023-06-09 DIAGNOSIS — M79676 Pain in unspecified toe(s): Secondary | ICD-10-CM

## 2023-06-09 DIAGNOSIS — B351 Tinea unguium: Secondary | ICD-10-CM | POA: Diagnosis not present

## 2023-06-09 DIAGNOSIS — E1165 Type 2 diabetes mellitus with hyperglycemia: Secondary | ICD-10-CM

## 2023-06-09 DIAGNOSIS — D2371 Other benign neoplasm of skin of right lower limb, including hip: Secondary | ICD-10-CM | POA: Diagnosis not present

## 2023-06-09 NOTE — Progress Notes (Signed)
 He presents today chief complaint of painful elongated toenails 1 through 5 bilaterally.  Objective: Pulses are palpable toenails are long thick yellow dystrophic clinical mycotic.  Multiple benign skin lesions plantar aspect of the bilateral foot no open lesions or wounds  Assessment: Pain limb secondary to onychomycosis and benign skin lesions  Plan: Debridement of toenails 1 through 5 bilateral debridement of all benign skin lesions.

## 2023-06-22 ENCOUNTER — Ambulatory Visit: Attending: Cardiology

## 2023-06-22 ENCOUNTER — Other Ambulatory Visit (HOSPITAL_COMMUNITY): Payer: Self-pay

## 2023-06-22 ENCOUNTER — Encounter: Payer: Self-pay | Admitting: *Deleted

## 2023-06-22 DIAGNOSIS — R0609 Other forms of dyspnea: Secondary | ICD-10-CM | POA: Diagnosis not present

## 2023-06-22 LAB — ECHOCARDIOGRAM COMPLETE
AR max vel: 2.57 cm2
AV Area VTI: 2.64 cm2
AV Area mean vel: 2.55 cm2
AV Mean grad: 5 mmHg
AV Peak grad: 10.6 mmHg
Ao pk vel: 1.63 m/s
Area-P 1/2: 2.69 cm2
S' Lateral: 3.3 cm

## 2023-06-22 NOTE — Progress Notes (Signed)
 Heart squeeze is noted at 60 to 65% which is normal function.  No regional wall motion abnormalities, there is some muscle stiffness noted to the left bottom of the heart likely from age or a blood pressure.  There is mild leakage noted from the mitral valve.  There is mild dilatation of the ascending aorta at 40 mm.  No findings suggest the cause of dyspnea on exertion.  Overall stable study.

## 2023-07-15 DIAGNOSIS — Z902 Acquired absence of lung [part of]: Secondary | ICD-10-CM | POA: Diagnosis not present

## 2023-07-15 DIAGNOSIS — F330 Major depressive disorder, recurrent, mild: Secondary | ICD-10-CM | POA: Diagnosis not present

## 2023-07-15 DIAGNOSIS — I2510 Atherosclerotic heart disease of native coronary artery without angina pectoris: Secondary | ICD-10-CM | POA: Diagnosis not present

## 2023-07-15 DIAGNOSIS — C3491 Malignant neoplasm of unspecified part of right bronchus or lung: Secondary | ICD-10-CM | POA: Diagnosis not present

## 2023-07-15 DIAGNOSIS — E1165 Type 2 diabetes mellitus with hyperglycemia: Secondary | ICD-10-CM | POA: Diagnosis not present

## 2023-07-15 DIAGNOSIS — R61 Generalized hyperhidrosis: Secondary | ICD-10-CM | POA: Diagnosis not present

## 2023-07-15 DIAGNOSIS — M545 Low back pain, unspecified: Secondary | ICD-10-CM | POA: Diagnosis not present

## 2023-07-15 DIAGNOSIS — M4807 Spinal stenosis, lumbosacral region: Secondary | ICD-10-CM | POA: Diagnosis not present

## 2023-07-15 DIAGNOSIS — J438 Other emphysema: Secondary | ICD-10-CM | POA: Diagnosis not present

## 2023-07-15 DIAGNOSIS — I700 Atherosclerosis of aorta: Secondary | ICD-10-CM | POA: Diagnosis not present

## 2023-07-15 DIAGNOSIS — Z6830 Body mass index (BMI) 30.0-30.9, adult: Secondary | ICD-10-CM | POA: Diagnosis not present

## 2023-07-15 DIAGNOSIS — Z87891 Personal history of nicotine dependence: Secondary | ICD-10-CM | POA: Diagnosis not present

## 2023-07-22 ENCOUNTER — Other Ambulatory Visit (HOSPITAL_COMMUNITY): Payer: Self-pay

## 2023-07-22 DIAGNOSIS — Z6833 Body mass index (BMI) 33.0-33.9, adult: Secondary | ICD-10-CM | POA: Diagnosis not present

## 2023-07-22 DIAGNOSIS — K219 Gastro-esophageal reflux disease without esophagitis: Secondary | ICD-10-CM | POA: Diagnosis not present

## 2023-07-22 DIAGNOSIS — I2510 Atherosclerotic heart disease of native coronary artery without angina pectoris: Secondary | ICD-10-CM | POA: Diagnosis not present

## 2023-07-22 DIAGNOSIS — E669 Obesity, unspecified: Secondary | ICD-10-CM | POA: Diagnosis not present

## 2023-07-22 DIAGNOSIS — Z902 Acquired absence of lung [part of]: Secondary | ICD-10-CM | POA: Diagnosis not present

## 2023-07-22 DIAGNOSIS — J9611 Chronic respiratory failure with hypoxia: Secondary | ICD-10-CM | POA: Diagnosis not present

## 2023-07-22 DIAGNOSIS — Z0000 Encounter for general adult medical examination without abnormal findings: Secondary | ICD-10-CM | POA: Diagnosis not present

## 2023-07-22 DIAGNOSIS — E1165 Type 2 diabetes mellitus with hyperglycemia: Secondary | ICD-10-CM | POA: Diagnosis not present

## 2023-07-22 DIAGNOSIS — M25551 Pain in right hip: Secondary | ICD-10-CM | POA: Diagnosis not present

## 2023-07-22 DIAGNOSIS — G8929 Other chronic pain: Secondary | ICD-10-CM | POA: Diagnosis not present

## 2023-07-22 DIAGNOSIS — M5136 Other intervertebral disc degeneration, lumbar region with discogenic back pain only: Secondary | ICD-10-CM | POA: Diagnosis not present

## 2023-07-22 MED ORDER — METFORMIN HCL 850 MG PO TABS
850.0000 mg | ORAL_TABLET | Freq: Every day | ORAL | 1 refills | Status: DC
  Filled 2023-07-22: qty 90, 90d supply, fill #0
  Filled 2023-10-19: qty 90, 90d supply, fill #1

## 2023-08-04 DIAGNOSIS — M48062 Spinal stenosis, lumbar region with neurogenic claudication: Secondary | ICD-10-CM | POA: Diagnosis not present

## 2023-08-04 DIAGNOSIS — Z6834 Body mass index (BMI) 34.0-34.9, adult: Secondary | ICD-10-CM | POA: Diagnosis not present

## 2023-08-04 DIAGNOSIS — M5416 Radiculopathy, lumbar region: Secondary | ICD-10-CM | POA: Diagnosis not present

## 2023-08-11 ENCOUNTER — Ambulatory Visit (HOSPITAL_COMMUNITY): Admit: 2023-08-11 | Payer: PPO | Admitting: Orthopedic Surgery

## 2023-08-11 SURGERY — ARTHROPLASTY, KNEE, TOTAL, USING IMAGELESS COMPUTER-ASSISTED NAVIGATION
Anesthesia: Spinal | Site: Knee | Laterality: Left

## 2023-08-18 DIAGNOSIS — M25551 Pain in right hip: Secondary | ICD-10-CM | POA: Diagnosis not present

## 2023-08-29 ENCOUNTER — Other Ambulatory Visit (HOSPITAL_COMMUNITY): Payer: Self-pay

## 2023-08-29 MED FILL — Carvedilol Tab 6.25 MG: ORAL | 90 days supply | Qty: 180 | Fill #1 | Status: AC

## 2023-08-30 ENCOUNTER — Other Ambulatory Visit: Payer: Self-pay

## 2023-08-30 ENCOUNTER — Other Ambulatory Visit (HOSPITAL_COMMUNITY): Payer: Self-pay

## 2023-08-30 MED ORDER — TRAZODONE HCL 50 MG PO TABS
50.0000 mg | ORAL_TABLET | Freq: Every day | ORAL | 3 refills | Status: AC
  Filled 2023-08-30: qty 90, 90d supply, fill #0
  Filled 2023-11-30: qty 90, 90d supply, fill #1
  Filled 2024-02-21: qty 90, 90d supply, fill #2

## 2023-08-30 MED ORDER — OMEPRAZOLE 40 MG PO CPDR
40.0000 mg | DELAYED_RELEASE_CAPSULE | Freq: Every day | ORAL | 1 refills | Status: DC
  Filled 2023-08-30: qty 90, 90d supply, fill #0

## 2023-08-30 MED ORDER — ATORVASTATIN CALCIUM 40 MG PO TABS
40.0000 mg | ORAL_TABLET | Freq: Every day | ORAL | 1 refills | Status: DC
  Filled 2023-08-30: qty 90, 90d supply, fill #0
  Filled 2023-11-26: qty 90, 90d supply, fill #1

## 2023-09-06 DIAGNOSIS — R0982 Postnasal drip: Secondary | ICD-10-CM | POA: Diagnosis not present

## 2023-09-07 ENCOUNTER — Ambulatory Visit (INDEPENDENT_AMBULATORY_CARE_PROVIDER_SITE_OTHER): Admitting: Podiatry

## 2023-09-07 DIAGNOSIS — L97521 Non-pressure chronic ulcer of other part of left foot limited to breakdown of skin: Secondary | ICD-10-CM

## 2023-09-07 DIAGNOSIS — E1165 Type 2 diabetes mellitus with hyperglycemia: Secondary | ICD-10-CM

## 2023-09-08 ENCOUNTER — Other Ambulatory Visit: Payer: Self-pay

## 2023-09-08 ENCOUNTER — Telehealth: Payer: Self-pay | Admitting: Podiatry

## 2023-09-08 MED ORDER — MUPIROCIN 2 % EX OINT: 1.0000 | TOPICAL_OINTMENT | Freq: Every day | CUTANEOUS | 0 refills | Status: AC

## 2023-09-08 NOTE — Progress Notes (Signed)
 He presents today for a wound check to the plantar aspect of his left foot.  States that he may have done something wrong where he was up on it too much and tried trimming it and he said now starting to drain he is experiencing little bit of pain from it.  Objective: Vital signs are stable alert oriented x 3.  Pulses are palpable.  Ulcerative lesion plantar aspect fourth metatarsal head area of the left foot does demonstrate some blood beneath the skin and beneath the reactive hyperkeratotic lesion which measures about 2 cm in diameter.  Upon debridement the lesion still measures about 2 cm in diameter however there is a central deeper lesion that measures about 5 mm in diameter and travels deep to the subcutaneous tissue.  Without purulence and without tracking to bone.  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  Fat layer exposed.  Plan: Debrided today after Betadine  application to subcutaneous tissue demonstrated to him how to dress the wound daily and he will follow-up with us  in a few weeks and her already scheduled appointment.

## 2023-09-08 NOTE — Telephone Encounter (Signed)
 Patient called and wanted to follow up on which cream he should put on his wound. His preferred pharmacy is the [facility].

## 2023-09-15 DIAGNOSIS — M533 Sacrococcygeal disorders, not elsewhere classified: Secondary | ICD-10-CM | POA: Diagnosis not present

## 2023-09-15 DIAGNOSIS — M5416 Radiculopathy, lumbar region: Secondary | ICD-10-CM | POA: Diagnosis not present

## 2023-09-18 ENCOUNTER — Other Ambulatory Visit (HOSPITAL_COMMUNITY): Payer: Self-pay

## 2023-09-20 ENCOUNTER — Other Ambulatory Visit: Payer: Self-pay

## 2023-09-21 ENCOUNTER — Other Ambulatory Visit (HOSPITAL_COMMUNITY): Payer: Self-pay

## 2023-09-21 ENCOUNTER — Other Ambulatory Visit: Payer: Self-pay

## 2023-10-13 ENCOUNTER — Ambulatory Visit: Admitting: Podiatry

## 2023-10-13 ENCOUNTER — Ambulatory Visit (INDEPENDENT_AMBULATORY_CARE_PROVIDER_SITE_OTHER): Admitting: Podiatry

## 2023-10-13 ENCOUNTER — Encounter: Payer: Self-pay | Admitting: Podiatry

## 2023-10-13 DIAGNOSIS — B351 Tinea unguium: Secondary | ICD-10-CM | POA: Diagnosis not present

## 2023-10-13 DIAGNOSIS — L97521 Non-pressure chronic ulcer of other part of left foot limited to breakdown of skin: Secondary | ICD-10-CM

## 2023-10-13 DIAGNOSIS — E1165 Type 2 diabetes mellitus with hyperglycemia: Secondary | ICD-10-CM | POA: Diagnosis not present

## 2023-10-13 DIAGNOSIS — M79676 Pain in unspecified toe(s): Secondary | ICD-10-CM | POA: Diagnosis not present

## 2023-10-13 NOTE — Progress Notes (Signed)
 He presents today for follow-up of his ulceration and his painful elongated toenails.  States that I was putting the offloading pads on my foot and they pulled the skin off but it was working.  Objective: Vital signs stable alert oriented x 3 denies changes past medical history medications allergies surgeries and social history pulses are palpable.  Toenails are long thick yellow dystrophic with mycotic beneath the fourth metatarsal head of the left foot is an area of reactive hyperkeratosis once debrided does demonstrate a 1 cm in diameter ulcerative lesion not probing to bone but probing to fat.  No purulence no malodor.  Small laceration of the skin is noted lateral to that wound.  Assessment: Pain in limb secondary to ulceration plantar aspect left foot.  Pain limb secondary to onychomycosis.  Plan: Debridement of toenails 1 through 5 bilateral and debridement of wound with debridement of skin and fat today.  Silvadene cream was placed and a dry sterile compressive dressing he will continue to dress and try to offload from for the next 2 weeks he will follow-up with Dr. Silva for evaluation of that wound in 2 weeks.

## 2023-10-14 ENCOUNTER — Telehealth: Payer: Self-pay | Admitting: Podiatry

## 2023-10-14 DIAGNOSIS — M533 Sacrococcygeal disorders, not elsewhere classified: Secondary | ICD-10-CM | POA: Diagnosis not present

## 2023-10-14 NOTE — Telephone Encounter (Signed)
 Patient is wanting to know if his diabetic shoes script was sent to [facility] yesterday?  I didn't see anything in his chart.  I'll be glad to send it if I can get a script.

## 2023-10-15 ENCOUNTER — Other Ambulatory Visit

## 2023-10-18 ENCOUNTER — Telehealth: Payer: Self-pay | Admitting: Podiatry

## 2023-10-18 NOTE — Telephone Encounter (Signed)
 Patient would like a RX sent to [facility] for diabetic shoes.

## 2023-10-18 NOTE — Telephone Encounter (Signed)
 Order, office note and demographics faxed to [facility] [phone], fax [phone]

## 2023-10-19 ENCOUNTER — Other Ambulatory Visit: Payer: Self-pay

## 2023-10-21 ENCOUNTER — Ambulatory Visit: Attending: Cardiology | Admitting: Cardiology

## 2023-10-21 ENCOUNTER — Other Ambulatory Visit (HOSPITAL_BASED_OUTPATIENT_CLINIC_OR_DEPARTMENT_OTHER): Payer: Self-pay

## 2023-10-21 ENCOUNTER — Other Ambulatory Visit (HOSPITAL_COMMUNITY): Payer: Self-pay

## 2023-10-21 ENCOUNTER — Encounter: Payer: Self-pay | Admitting: Cardiology

## 2023-10-21 VITALS — BP 115/64 | HR 57 | Ht 70.0 in | Wt 235.0 lb

## 2023-10-21 DIAGNOSIS — I2510 Atherosclerotic heart disease of native coronary artery without angina pectoris: Secondary | ICD-10-CM | POA: Diagnosis not present

## 2023-10-21 DIAGNOSIS — J439 Emphysema, unspecified: Secondary | ICD-10-CM | POA: Diagnosis not present

## 2023-10-21 DIAGNOSIS — I10 Essential (primary) hypertension: Secondary | ICD-10-CM

## 2023-10-21 DIAGNOSIS — I471 Supraventricular tachycardia, unspecified: Secondary | ICD-10-CM | POA: Diagnosis not present

## 2023-10-21 DIAGNOSIS — G4733 Obstructive sleep apnea (adult) (pediatric): Secondary | ICD-10-CM

## 2023-10-21 DIAGNOSIS — R0609 Other forms of dyspnea: Secondary | ICD-10-CM | POA: Diagnosis not present

## 2023-10-21 DIAGNOSIS — J4489 Other specified chronic obstructive pulmonary disease: Secondary | ICD-10-CM

## 2023-10-21 DIAGNOSIS — E782 Mixed hyperlipidemia: Secondary | ICD-10-CM | POA: Diagnosis not present

## 2023-10-21 DIAGNOSIS — Z01810 Encounter for preprocedural cardiovascular examination: Secondary | ICD-10-CM | POA: Diagnosis not present

## 2023-10-21 MED ORDER — LOSARTAN POTASSIUM 50 MG PO TABS
50.0000 mg | ORAL_TABLET | Freq: Every day | ORAL | 3 refills | Status: DC
  Filled 2023-10-21 – 2023-12-15 (×2): qty 90, 90d supply, fill #0
  Filled 2024-03-15: qty 90, 90d supply, fill #1

## 2023-10-21 MED ORDER — CARVEDILOL 6.25 MG PO TABS
6.2500 mg | ORAL_TABLET | Freq: Two times a day (BID) | ORAL | 3 refills | Status: AC
  Filled 2023-10-21 – 2023-11-26 (×2): qty 180, 90d supply, fill #0
  Filled 2024-02-27: qty 180, 90d supply, fill #1

## 2023-10-21 MED ORDER — SPIRONOLACTONE 25 MG PO TABS
25.0000 mg | ORAL_TABLET | Freq: Every day | ORAL | 3 refills | Status: DC
  Filled 2023-10-21 – 2023-12-15 (×2): qty 90, 90d supply, fill #0

## 2023-10-21 NOTE — Patient Instructions (Signed)
 Medication Instructions:  Your physician recommends that you continue on your current medications as directed. Please refer to the Current Medication list given to you today.   *If you need a refill on your cardiac medications before your next appointment, please call your pharmacy*  Lab Work: No labs ordered today  If you have labs (blood work) drawn today and your tests are completely normal, you will receive your results only by: MyChart Message (if you have MyChart) OR A paper copy in the mail If you have any lab test that is abnormal or we need to change your treatment, we will call you to review the results.  Testing/Procedures: No test ordered today   Follow-Up: At [facility], you and your health needs are our priority.  As part of our continuing mission to provide you with exceptional heart care, our providers are all part of one team.  This team includes your primary Cardiologist (physician) and Advanced Practice Providers or APPs (Physician Assistants and Nurse Practitioners) who all work together to provide you with the care you need, when you need it.  Your next appointment:   6 month(s)  Provider:   Antionette Kirks, MD or Ronald Cockayne, NP

## 2023-10-21 NOTE — Progress Notes (Signed)
 Cardiology Office Note   Date:  10/21/2023  ID:  Howard Maxwell, DOB June 18, 1945, MRN [id number] PCP: Sadie Manna, MD  [facility] Providers Cardiologist:  Deatrice Cage, MD     History of Present Illness Howard Maxwell is a 78 y.o. 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   Patient presented September 2015 with unstable angina.  Cardiac cath showed an occluded mid RCA which was medium in size and codominant with faint collaterals.  Normal ejection fraction.  Patient was treated medically.  Patient quit smoking at this time.  During cardiac catheterization, he had an episode of suspected SVT.  Patient has a history of severe iron deficiency anemia in 2015 due to GI blood loss. He had implantation of hypoglossal nerve stimulator for sleep apnea in April 2022.  He had an echocardiogram done in May 2022 which showed normal LVSF with no significant valvular abnormalities.  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  He was seen in clinic 02/11/2023 at that time was doing well with cardiac perspective.  Blood pressure been well-controlled.  He was also needing knee replacement and dental surgery.  There were no changes made to his medication or further testing that was ordered at that time.   He was last seen in clinic 05/22/2023 stating he was doing well from a cardiac perspective.  He says his arthritis is limiting his function.  He had also had increased amount of urination with spironolactone  but placed on Myrbetriq  tablets overactive bladder and had questions, help spironolactone .  Spironolactone  dosing was decreased from 25 mg daily to 12.5 mg daily was scheduled for an updated echocardiogram.  He returns to clinic today stating that aside from his arthritis he has  been doing well.  Previously he had been encouraged to decrease his spironolactone  but he is continued on full dosing and thinks that he is tolerating it well with his Myrbetriq  now and would like to continue with the full dose.  He denies any chest pain, worsening shortness of breath, or peripheral edema.  Continues to work in his garden in the morning but then comes send versus being elevated today.  He states he is planning on having orthopedic surgery scheduled in October.  States that he has been compliant with his current medication regimen without any undue side effects.  Denies any hospitalizations or visits to the emergency department.  ROS: 10 point review of systems has been reviewed and considered negative except ones are listed in the HPI  Studies Reviewed EKG Interpretation Date/Time:  Thursday October 21 2023 11:29:36 EDT Ventricular Rate:  57 PR Interval:  298 QRS Duration:  84 QT Interval:  386 QTC Calculation: 375 R Axis:   -8  Text Interpretation: Sinus bradycardia with 1st degree A-V block Inferior infarct (cited on or before 11-Feb-2023) When compared with ECG of 26-May-2023 10:39, No significant change was found Confirmed by Gerard Frederick ([id number]) on 10/21/2023 11:31:44 AM    2D echo 06/22/2023 1. Left ventricular ejection fraction, by estimation, is 60 to 65%. The  left ventricle has normal function. The left ventricle has no regional  wall motion abnormalities. Left ventricular diastolic parameters are  consistent with Grade I diastolic  dysfunction (impaired relaxation). The average left ventricular global  longitudinal strain is -17.5 %. The global longitudinal strain  is normal.   2. Right ventricular systolic function is normal. The right ventricular  size is normal.   3. The mitral valve is normal in structure. Mild mitral valve  regurgitation.   4. The aortic valve is tricuspid. Aortic valve regurgitation is not  visualized.   5. Aortic dilatation noted. There is  mild dilatation of the ascending  aorta, measuring 40 mm.   6. The inferior vena cava is normal in size with greater than 50%  respiratory variability, suggesting right atrial pressure of 3 mmHg.   Myoview  lexiscan  03/2022   The study is normal. The study is low risk.   No ST deviation was noted.   Left ventricular function is normal. Nuclear stress EF: 68 %. The left ventricular ejection fraction is hyperdynamic (>65%). End diastolic cavity size is mildly enlarged.   Prior study not available for comparison.   Heart monitor 11/2021 Patch Wear Time:  0 days and 19 hours (2023-08-31T14:18:25-0400 to 2023-09-01T09:44:59-0400)   Patient had a min HR of 50 bpm, max HR of 108 bpm, and avg HR of 74 bpm. Predominant underlying rhythm was Sinus Rhythm. First Degree AV Block was present.  Rare PACs and rare PVCs.   Echo 07/2020 1. Left ventricular ejection fraction, by estimation, is 55 to 60%. The  left ventricle has normal function. The left ventricle has no regional  wall motion abnormalities. Left ventricular diastolic parameters were  normal.   2. Right ventricular systolic function is normal. The right ventricular  size is normal.   3. The mitral valve is normal in structure. No evidence of mitral valve  regurgitation.   4. The aortic valve is tricuspid. Aortic valve regurgitation is not  visualized.   5. Aortic dilatation noted. There is mild dilatation of the ascending  aorta, measuring 40 mm.   6. The inferior vena cava is normal in size with greater than 50%  respiratory variability, suggesting right atrial pressure of 3 mmHg. Risk Assessment/Calculations           Physical Exam VS:  BP 115/64 (BP Location: Left Arm, Patient Position: Sitting, Cuff Size: Normal)   Pulse (!) 57   Ht 5' 10 (1.778 m)   Wt 235 lb (106.6 kg)   SpO2 95%   BMI 33.72 kg/m        Wt Readings from Last 3 Encounters:  10/21/23 235 lb (106.6 kg)  05/26/23 229 lb 4 oz (104 kg)  05/17/23 230 lb 6.4  oz (104.5 kg)    GEN: Well nourished, well developed in no acute distress NECK: No JVD; No carotid bruits CARDIAC: RRR, no murmurs, rubs, gallops RESPIRATORY:  Clear to auscultation without rales, wheezing or rhonchi  ABDOMEN: Soft, non-tender, non-distended EXTREMITIES:  No edema; No deformity   ASSESSMENT AND PLAN Coronary artery disease of native coronary arteries without angina left heart catheterization was completed in 2015 which showed occluded mid RCA with faint collaterals.  (Ischemic evaluation in 03/2018 for Lexiscan  Myoview  which was considered low risk.  EKG today reveals sinus bradycardia with first-degree AV block, with an old inferior infarct with no acute changes noted.  He has been continued on aspirin  81 mg daily and atorvastatin  40 milligrams daily.  No further ischemic testing needed at this time.  Dyspnea that has remained stable likely related to sedentary lifestyle.  Echocardiogram recently completed revealed LVEF of 60 to 65%, no RWMA, G1 DD, to mild mitral regurgitation with mild dilatation of the ascending aorta measuring 40 mm.  Primary  hypertension lisinopril with a blood pressure today 115/64.  Blood pressures remain stable.  He has been continued on carvedilol  6.25 mg twice daily, losartan  50 mg daily, and spironolactone  25 mg daily.  He has been encouraged to continue to monitor his pressures 1 to 2 hours postmedication administration as well.  Hyperlipidemia with last LDL of 39 which remains at goal.  He is continued on atorvastatin  40 mg daily.  Paroxysmal SVT that has been quiescent.  Continued on carvedilol  6.25 mg twice daily.  Obstructive sleep apnea status post inspire implant.  Status post lobectomy for lung cancer and COPD on chronic oxygen  at 3 L O2 via nasal cannula at bedtime.  Preoperative cardiovascular examination was previously completed and he was recommended to have an echocardiogram before disposition can be made regarding his surgical risk.   He may proceed with upcoming orthopedic surgery at acceptable risk without further cardiovascular testing needed at this time.       Dispo: Patient to return to clinic to see MD/APP in 6 months or sooner if needed for further evaluation  Signed, Arabel Barcenas, NP
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
## 2023-10-27 ENCOUNTER — Encounter: Payer: Self-pay | Admitting: Urology

## 2023-10-27 ENCOUNTER — Other Ambulatory Visit (HOSPITAL_COMMUNITY): Payer: Self-pay

## 2023-10-27 ENCOUNTER — Other Ambulatory Visit: Payer: Self-pay

## 2023-10-27 ENCOUNTER — Ambulatory Visit (INDEPENDENT_AMBULATORY_CARE_PROVIDER_SITE_OTHER): Payer: Self-pay | Admitting: Urology

## 2023-10-27 VITALS — BP 136/78 | HR 61 | Ht 70.0 in | Wt 231.0 lb

## 2023-10-27 DIAGNOSIS — N3281 Overactive bladder: Secondary | ICD-10-CM

## 2023-10-27 DIAGNOSIS — N5201 Erectile dysfunction due to arterial insufficiency: Secondary | ICD-10-CM

## 2023-10-27 MED ORDER — TADALAFIL 20 MG PO TABS
20.0000 mg | ORAL_TABLET | Freq: Every day | ORAL | 3 refills | Status: AC | PRN
  Filled 2023-10-27: qty 10, 10d supply, fill #0
  Filled 2023-11-30: qty 10, 10d supply, fill #1
  Filled 2024-02-27: qty 10, 10d supply, fill #2

## 2023-10-27 MED ORDER — MIRABEGRON ER 50 MG PO TB24
50.0000 mg | ORAL_TABLET | Freq: Every day | ORAL | 3 refills | Status: DC
  Filled 2023-10-27 – 2023-11-14 (×2): qty 90, 90d supply, fill #0

## 2023-11-08 ENCOUNTER — Inpatient Hospital Stay: Payer: PPO | Attending: Internal Medicine

## 2023-11-08 ENCOUNTER — Ambulatory Visit (HOSPITAL_COMMUNITY)
Admission: RE | Admit: 2023-11-08 | Discharge: 2023-11-08 | Disposition: A | Discharge status: Home / Self Care | Payer: PPO | Source: Ambulatory Visit | Attending: Internal Medicine | Admitting: Internal Medicine

## 2023-11-08 ENCOUNTER — Encounter (HOSPITAL_COMMUNITY): Payer: Self-pay

## 2023-11-08 DIAGNOSIS — K760 Fatty (change of) liver, not elsewhere classified: Secondary | ICD-10-CM | POA: Diagnosis not present

## 2023-11-08 DIAGNOSIS — C3490 Malignant neoplasm of unspecified part of unspecified bronchus or lung: Secondary | ICD-10-CM

## 2023-11-08 DIAGNOSIS — J90 Pleural effusion, not elsewhere classified: Secondary | ICD-10-CM | POA: Diagnosis not present

## 2023-11-08 DIAGNOSIS — Z85118 Personal history of other malignant neoplasm of bronchus and lung: Secondary | ICD-10-CM | POA: Diagnosis not present

## 2023-11-08 DIAGNOSIS — Z08 Encounter for follow-up examination after completed treatment for malignant neoplasm: Secondary | ICD-10-CM | POA: Insufficient documentation

## 2023-11-08 DIAGNOSIS — Z902 Acquired absence of lung [part of]: Secondary | ICD-10-CM | POA: Insufficient documentation

## 2023-11-08 DIAGNOSIS — J439 Emphysema, unspecified: Secondary | ICD-10-CM | POA: Diagnosis not present

## 2023-11-08 LAB — CMP (CANCER CENTER ONLY)
ALT: 19 U/L (ref 0–44)
AST: 14 U/L — ABNORMAL LOW (ref 15–41)
Albumin: 4.2 g/dL (ref 3.5–5.0)
Alkaline Phosphatase: 71 U/L (ref 38–126)
Anion gap: 7 (ref 5–15)
BUN: 10 mg/dL (ref 8–23)
CO2: 30 mmol/L (ref 22–32)
Calcium: 9.1 mg/dL (ref 8.9–10.3)
Chloride: 100 mmol/L (ref 98–111)
Creatinine: 0.74 mg/dL (ref 0.61–1.24)
GFR, Estimated: >60 mL/min (ref >60)
Glucose, Bld: 135 mg/dL — ABNORMAL HIGH (ref 70–99)
Potassium: 4.4 mmol/L (ref 3.5–5.1)
Sodium: 137 mmol/L (ref 135–145)
Total Bilirubin: 0.7 mg/dL (ref 0.0–1.2)
Total Protein: 6.9 g/dL (ref 6.5–8.1)

## 2023-11-08 LAB — CBC WITH DIFFERENTIAL (CANCER CENTER ONLY)
Abs Immature Granulocytes: 0.03 K/uL (ref 0.00–0.07)
Basophils Absolute: 0.1 K/uL (ref 0.0–0.1)
Basophils Relative: 1 %
Eosinophils Absolute: 0.2 K/uL (ref 0.0–0.5)
Eosinophils Relative: 3 %
HCT: 46.1 % (ref 39.0–52.0)
Hemoglobin: 14.9 g/dL (ref 13.0–17.0)
Immature Granulocytes: 0 %
Lymphocytes Relative: 19 %
Lymphs Abs: 1.5 K/uL (ref 0.7–4.0)
MCH: 30.6 pg (ref 26.0–34.0)
MCHC: 32.3 g/dL (ref 30.0–36.0)
MCV: 94.7 fL (ref 80.0–100.0)
Monocytes Absolute: 0.6 K/uL (ref 0.1–1.0)
Monocytes Relative: 8 %
Neutro Abs: 5.5 K/uL (ref 1.7–7.7)
Neutrophils Relative %: 69 %
Platelet Count: 194 K/uL (ref 150–400)
RBC: 4.87 MIL/uL (ref 4.22–5.81)
RDW: 14.5 % (ref 11.5–15.5)
WBC Count: 7.8 K/uL (ref 4.0–10.5)
nRBC: 0 % (ref 0.0–0.2)

## 2023-11-08 MED ORDER — IOHEXOL 300 MG/ML  SOLN
75.0000 mL | Freq: Once | INTRAMUSCULAR | Status: AC | PRN
  Administered 2023-11-08: 75 mL via INTRAVENOUS

## 2023-11-15 ENCOUNTER — Other Ambulatory Visit (HOSPITAL_COMMUNITY): Payer: Self-pay

## 2023-11-15 ENCOUNTER — Other Ambulatory Visit: Payer: Self-pay

## 2023-11-15 ENCOUNTER — Inpatient Hospital Stay (HOSPITAL_BASED_OUTPATIENT_CLINIC_OR_DEPARTMENT_OTHER): Payer: PPO | Admitting: Internal Medicine

## 2023-11-15 VITALS — BP 159/86 | HR 65 | Temp 97.2°F | Resp 17 | Ht 70.0 in | Wt 235.0 lb

## 2023-11-15 DIAGNOSIS — C3490 Malignant neoplasm of unspecified part of unspecified bronchus or lung: Secondary | ICD-10-CM | POA: Diagnosis not present

## 2023-11-15 DIAGNOSIS — Z08 Encounter for follow-up examination after completed treatment for malignant neoplasm: Secondary | ICD-10-CM | POA: Diagnosis not present
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
## 2023-11-17 DIAGNOSIS — B360 Pityriasis versicolor: Secondary | ICD-10-CM | POA: Diagnosis not present

## 2023-11-17 DIAGNOSIS — D2272 Melanocytic nevi of left lower limb, including hip: Secondary | ICD-10-CM | POA: Diagnosis not present

## 2023-11-17 DIAGNOSIS — L821 Other seborrheic keratosis: Secondary | ICD-10-CM | POA: Diagnosis not present

## 2023-11-17 DIAGNOSIS — Z08 Encounter for follow-up examination after completed treatment for malignant neoplasm: Secondary | ICD-10-CM | POA: Diagnosis not present

## 2023-11-17 DIAGNOSIS — Z85828 Personal history of other malignant neoplasm of skin: Secondary | ICD-10-CM | POA: Diagnosis not present

## 2023-11-17 DIAGNOSIS — L570 Actinic keratosis: Secondary | ICD-10-CM | POA: Diagnosis not present

## 2023-11-17 DIAGNOSIS — D2271 Melanocytic nevi of right lower limb, including hip: Secondary | ICD-10-CM | POA: Diagnosis not present

## 2023-11-17 DIAGNOSIS — D225 Melanocytic nevi of trunk: Secondary | ICD-10-CM | POA: Diagnosis not present

## 2023-11-17 DIAGNOSIS — D2261 Melanocytic nevi of right upper limb, including shoulder: Secondary | ICD-10-CM | POA: Diagnosis not present

## 2023-11-17 DIAGNOSIS — D2262 Melanocytic nevi of left upper limb, including shoulder: Secondary | ICD-10-CM | POA: Diagnosis not present

## 2023-11-18 DIAGNOSIS — M25551 Pain in right hip: Secondary | ICD-10-CM | POA: Diagnosis not present

## 2023-11-18 DIAGNOSIS — Z902 Acquired absence of lung [part of]: Secondary | ICD-10-CM | POA: Diagnosis not present

## 2023-11-18 DIAGNOSIS — Z125 Encounter for screening for malignant neoplasm of prostate: Secondary | ICD-10-CM | POA: Diagnosis not present

## 2023-11-18 DIAGNOSIS — I2510 Atherosclerotic heart disease of native coronary artery without angina pectoris: Secondary | ICD-10-CM | POA: Diagnosis not present

## 2023-11-18 DIAGNOSIS — K219 Gastro-esophageal reflux disease without esophagitis: Secondary | ICD-10-CM | POA: Diagnosis not present

## 2023-11-18 DIAGNOSIS — G8929 Other chronic pain: Secondary | ICD-10-CM | POA: Diagnosis not present

## 2023-11-18 DIAGNOSIS — Z6833 Body mass index (BMI) 33.0-33.9, adult: Secondary | ICD-10-CM | POA: Diagnosis not present

## 2023-11-18 DIAGNOSIS — E1165 Type 2 diabetes mellitus with hyperglycemia: Secondary | ICD-10-CM | POA: Diagnosis not present

## 2023-11-18 DIAGNOSIS — Z0000 Encounter for general adult medical examination without abnormal findings: Secondary | ICD-10-CM | POA: Diagnosis not present

## 2023-11-18 DIAGNOSIS — M5136 Other intervertebral disc degeneration, lumbar region with discogenic back pain only: Secondary | ICD-10-CM | POA: Diagnosis not present

## 2023-11-21 ENCOUNTER — Other Ambulatory Visit (HOSPITAL_COMMUNITY): Payer: Self-pay

## 2023-11-23 ENCOUNTER — Other Ambulatory Visit: Payer: Self-pay

## 2023-11-23 ENCOUNTER — Other Ambulatory Visit (HOSPITAL_COMMUNITY): Payer: Self-pay

## 2023-11-23 MED ORDER — GLIPIZIDE ER 10 MG PO TB24
10.0000 mg | ORAL_TABLET | Freq: Every day | ORAL | 1 refills | Status: AC
  Filled 2023-11-23 (×2): qty 90, 90d supply, fill #0
  Filled 2024-02-21: qty 90, 90d supply, fill #1

## 2023-11-24 ENCOUNTER — Ambulatory Visit (INDEPENDENT_AMBULATORY_CARE_PROVIDER_SITE_OTHER): Admitting: Podiatry

## 2023-11-24 ENCOUNTER — Encounter: Payer: Self-pay | Admitting: Podiatry

## 2023-11-24 DIAGNOSIS — L97522 Non-pressure chronic ulcer of other part of left foot with fat layer exposed: Secondary | ICD-10-CM

## 2023-11-24 NOTE — Progress Notes (Signed)
  Subjective:  Patient ID: Howard Maxwell, male    DOB: 1945-10-18,  MRN: [id number]  Chief Complaint  Patient presents with   Foot Ulcer    Continuous oozing per patient. Sporatic pain.     78 y.o. male presents with the above complaint. History confirmed with patient.  He is referred to me by Dr. Patsy he has been using mupirocin  ointment on this as well as a bandage.  Objective:  Physical Exam: Foot is warm with a palpable PT pulse he has a full-thickness ulcer submetatarsal 3 measuring 0.3 x 0.5 x 0.3 cm predebridement and postdebridement measuring 0.5 x 1.0 x 0.5 cm.  There is a large amount of hyperkeratosis and biofilm and slough      Assessment:   1. Ulcer of left foot with fat layer exposed (HCC)      Plan:  Patient was evaluated and treated and all questions answered.   Ulcer left foot -We discussed the etiology and factors that are a part of the wound healing process.  We also discussed the risk of infection both soft tissue and osteomyelitis from open ulceration.  Discussed the risk of limb loss if this happens or worsens. -Debridement as below. -Dressed with Iodosorb, DSD. -Continue home dressing changes daily with mupirocin  ointment -Continue off-loading with surgical shoe and peg assist device.  These were dispensed today -Vascular testing pulses are palpable -HgbA1c: 7.6% -Last antibiotics: No indication for oral antibiotics at this time   Procedure: Excisional Debridement of Wound Rationale: Removal of non-viable soft tissue from the wound to promote healing.  Anesthesia: none Post-Debridement Wound Measurements: Noted above Type of Debridement: Sharp Excisional Tissue Removed: Non-viable soft tissue Depth of Debridement: subcutaneous tissue. Technique: Sharp excisional debridement to bleeding, viable wound base.  Dressing: Dry, sterile, compression dressing. Disposition: Patient tolerated procedure well.      Return in about 2 weeks (around  12/08/2023) for wound care.

## 2023-11-25 ENCOUNTER — Other Ambulatory Visit (HOSPITAL_COMMUNITY): Payer: Self-pay

## 2023-11-25 DIAGNOSIS — M4807 Spinal stenosis, lumbosacral region: Secondary | ICD-10-CM | POA: Diagnosis not present

## 2023-11-25 DIAGNOSIS — R1310 Dysphagia, unspecified: Secondary | ICD-10-CM | POA: Diagnosis not present

## 2023-11-25 DIAGNOSIS — Z860100 Personal history of colon polyps, unspecified: Secondary | ICD-10-CM | POA: Diagnosis not present

## 2023-11-25 DIAGNOSIS — I10 Essential (primary) hypertension: Secondary | ICD-10-CM | POA: Diagnosis not present

## 2023-11-25 DIAGNOSIS — Z6834 Body mass index (BMI) 34.0-34.9, adult: Secondary | ICD-10-CM | POA: Diagnosis not present

## 2023-11-25 DIAGNOSIS — K219 Gastro-esophageal reflux disease without esophagitis: Secondary | ICD-10-CM | POA: Diagnosis not present

## 2023-11-25 DIAGNOSIS — I2510 Atherosclerotic heart disease of native coronary artery without angina pectoris: Secondary | ICD-10-CM | POA: Diagnosis not present

## 2023-11-25 DIAGNOSIS — G629 Polyneuropathy, unspecified: Secondary | ICD-10-CM | POA: Diagnosis not present

## 2023-11-25 DIAGNOSIS — M25551 Pain in right hip: Secondary | ICD-10-CM | POA: Diagnosis not present

## 2023-11-25 DIAGNOSIS — C3491 Malignant neoplasm of unspecified part of right bronchus or lung: Secondary | ICD-10-CM | POA: Diagnosis not present

## 2023-11-25 DIAGNOSIS — E1165 Type 2 diabetes mellitus with hyperglycemia: Secondary | ICD-10-CM | POA: Diagnosis not present

## 2023-11-25 DIAGNOSIS — G8929 Other chronic pain: Secondary | ICD-10-CM | POA: Diagnosis not present

## 2023-11-25 MED ORDER — OMEPRAZOLE 20 MG PO TBEC
20.0000 mg | DELAYED_RELEASE_TABLET | Freq: Every morning | ORAL | 6 refills | Status: AC
  Filled 2023-11-26: qty 42, 42d supply, fill #0

## 2023-11-25 MED ORDER — METFORMIN HCL 1000 MG PO TABS
1000.0000 mg | ORAL_TABLET | Freq: Two times a day (BID) | ORAL | 1 refills | Status: AC
  Filled 2023-11-25: qty 180, 90d supply, fill #0
  Filled 2024-02-21: qty 180, 90d supply, fill #1

## 2023-11-26 ENCOUNTER — Other Ambulatory Visit: Payer: Self-pay

## 2023-11-26 ENCOUNTER — Other Ambulatory Visit (HOSPITAL_COMMUNITY): Payer: Self-pay

## 2023-11-29 ENCOUNTER — Other Ambulatory Visit: Payer: Self-pay

## 2023-11-30 ENCOUNTER — Telehealth: Payer: Self-pay | Admitting: *Deleted

## 2023-11-30 ENCOUNTER — Other Ambulatory Visit (HOSPITAL_COMMUNITY): Payer: Self-pay

## 2023-11-30 ENCOUNTER — Other Ambulatory Visit (HOSPITAL_BASED_OUTPATIENT_CLINIC_OR_DEPARTMENT_OTHER): Payer: Self-pay

## 2023-11-30 NOTE — Telephone Encounter (Signed)
 Fax received from [facility] to perform a colonoscopy/egd under general anesthesia on patient.  Patient needs surgery clearance. Surgery is 01/11/24. Patient was seen on 03/05/23. Office protocol is a risk assessment can be sent to surgeon if patient has been seen in 60 days or less.    Pt needs appt for risk assessment.  I called and spoke with the pt and got him scheduled for 12/13/23 at [facility] with Candis  Routing to clearance pool until visit has been completed

## 2023-12-01 ENCOUNTER — Other Ambulatory Visit (HOSPITAL_COMMUNITY): Payer: Self-pay

## 2023-12-02 ENCOUNTER — Encounter: Payer: Self-pay | Admitting: *Deleted

## 2023-12-02 ENCOUNTER — Other Ambulatory Visit (HOSPITAL_COMMUNITY): Payer: Self-pay

## 2023-12-08 ENCOUNTER — Other Ambulatory Visit (HOSPITAL_COMMUNITY): Payer: Self-pay

## 2023-12-08 ENCOUNTER — Ambulatory Visit (INDEPENDENT_AMBULATORY_CARE_PROVIDER_SITE_OTHER): Admitting: Podiatry

## 2023-12-08 VITALS — Ht 70.0 in | Wt 235.0 lb

## 2023-12-08 DIAGNOSIS — L97522 Non-pressure chronic ulcer of other part of left foot with fat layer exposed: Secondary | ICD-10-CM

## 2023-12-08 DIAGNOSIS — E1140 Type 2 diabetes mellitus with diabetic neuropathy, unspecified: Secondary | ICD-10-CM | POA: Diagnosis not present

## 2023-12-08 DIAGNOSIS — M216X2 Other acquired deformities of left foot: Secondary | ICD-10-CM

## 2023-12-08 NOTE — Progress Notes (Signed)
  Subjective:  Patient ID: Howard Maxwell, male    DOB: [birth year],  MRN: [id number]  Chief Complaint  Patient presents with   Wound Check    RM 2 Pt is here for ulcer of the left foot. Ulcer is scabbed over with no visible drainage.    78 y.o. male presents with the above complaint. History confirmed with patient.  He is doing well he has been using the mupirocin  as directed  Objective:  Physical Exam: Foot is warm with a palpable PT pulse he has a full-thickness ulcer submetatarsal 3 measuring 0.3 x 0.5 x 0.3 cm predebridement and postdebridement measuring 0.2 x 0.3 x 0.2 cm.  There is a small amount of hyperkeratosis and biofilm and slough      Assessment:   1. Ulcer of left foot with fat layer exposed (HCC)   2. Plantar flexed metatarsal, left      Plan:  Patient was evaluated and treated and all questions answered.   Ulcer left foot - Overall doing very well he has nearly completely healed he should continue to utilize the offloading surgical shoe to reduce pressure on the wound he is going to pick up his diabetic shoes today and I asked him to bring these with him to his next visit so to make sure that they are properly offloaded which he believes that they will be.  Continue using mupirocin  ointment and bandage daily until it heals.     Return in about 3 weeks (around 12/29/2023) for wound care.

## 2023-12-09 ENCOUNTER — Other Ambulatory Visit (HOSPITAL_COMMUNITY): Payer: Self-pay

## 2023-12-09 DIAGNOSIS — M5416 Radiculopathy, lumbar region: Secondary | ICD-10-CM | POA: Diagnosis not present

## 2023-12-09 MED ORDER — TRAMADOL HCL 50 MG PO TABS
50.0000 mg | ORAL_TABLET | Freq: Three times a day (TID) | ORAL | 5 refills | Status: AC | PRN
  Filled 2023-12-09: qty 90, 15d supply, fill #0
  Filled 2024-01-04: qty 90, 15d supply, fill #1
  Filled 2024-02-01: qty 90, 15d supply, fill #2
  Filled 2024-02-21: qty 90, 15d supply, fill #3
  Filled 2024-03-09: qty 90, 15d supply, fill #4
  Filled 2024-03-30: qty 90, 15d supply, fill #5

## 2023-12-10 ENCOUNTER — Other Ambulatory Visit (HOSPITAL_COMMUNITY): Payer: Self-pay

## 2023-12-13 ENCOUNTER — Other Ambulatory Visit (HOSPITAL_COMMUNITY): Payer: Self-pay

## 2023-12-13 ENCOUNTER — Ambulatory Visit (HOSPITAL_BASED_OUTPATIENT_CLINIC_OR_DEPARTMENT_OTHER)

## 2023-12-13 ENCOUNTER — Encounter (HOSPITAL_BASED_OUTPATIENT_CLINIC_OR_DEPARTMENT_OTHER): Payer: Self-pay

## 2023-12-13 VITALS — BP 119/70 | HR 80 | Ht 70.0 in | Wt 235.0 lb

## 2023-12-13 DIAGNOSIS — Z87891 Personal history of nicotine dependence: Secondary | ICD-10-CM

## 2023-12-13 DIAGNOSIS — G4733 Obstructive sleep apnea (adult) (pediatric): Secondary | ICD-10-CM | POA: Diagnosis not present

## 2023-12-13 DIAGNOSIS — C3491 Malignant neoplasm of unspecified part of right bronchus or lung: Secondary | ICD-10-CM

## 2023-12-13 DIAGNOSIS — Z85118 Personal history of other malignant neoplasm of bronchus and lung: Secondary | ICD-10-CM | POA: Diagnosis not present

## 2023-12-13 DIAGNOSIS — Z01811 Encounter for preprocedural respiratory examination: Secondary | ICD-10-CM

## 2023-12-13 DIAGNOSIS — Z9682 Presence of neurostimulator: Secondary | ICD-10-CM

## 2023-12-13 DIAGNOSIS — R0902 Hypoxemia: Secondary | ICD-10-CM | POA: Diagnosis not present

## 2023-12-13 DIAGNOSIS — J439 Emphysema, unspecified: Secondary | ICD-10-CM

## 2023-12-13 DIAGNOSIS — J4489 Other specified chronic obstructive pulmonary disease: Secondary | ICD-10-CM | POA: Diagnosis not present

## 2023-12-13 DIAGNOSIS — G4734 Idiopathic sleep related nonobstructive alveolar hypoventilation: Secondary | ICD-10-CM

## 2023-12-13 NOTE — Assessment & Plan Note (Signed)
 Reports that he is not using Anoro; recommend that he resume use if he is having dyspnea or limitations to his ADLs due to dyspnea May use Albuterol  as needed; patient reports that he does not use this either

## 2023-12-13 NOTE — Patient Instructions (Signed)
 Seen for risk assessment today: ARISCAT score as follows: Intermediate risk 13.3% risk of in-hospital post-op pulmonary complications (composite including respiratory failure, respiratory infection, pleural effusion, atelectasis, pneumothorax, bronchospasm, aspiration pneumonitis)  Recommend early ambulation, cough and breathing exercises, incentive spirometry postoperatively.  May need supportive oxygen  postoperatively.  Patient to bring INSPIRE device remote with him.  Return to clinic for follow up with Dr. Jude in 3 months.
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
## 2023-12-13 NOTE — Assessment & Plan Note (Signed)
-    In remission; follows with Oncology

## 2023-12-13 NOTE — Assessment & Plan Note (Signed)
 Continue with Inspire device usage
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
## 2023-12-14 ENCOUNTER — Other Ambulatory Visit (HOSPITAL_COMMUNITY): Payer: Self-pay

## 2023-12-15 ENCOUNTER — Other Ambulatory Visit: Payer: Self-pay

## 2023-12-20 NOTE — Telephone Encounter (Signed)
 Note from 12/13/23 was faxed to [facility].

## 2023-12-23 ENCOUNTER — Encounter (HOSPITAL_COMMUNITY): Payer: Self-pay

## 2023-12-23 ENCOUNTER — Other Ambulatory Visit (HOSPITAL_COMMUNITY): Payer: Self-pay

## 2023-12-26 ENCOUNTER — Other Ambulatory Visit (HOSPITAL_COMMUNITY): Payer: Self-pay

## 2023-12-29 ENCOUNTER — Encounter: Payer: Self-pay | Admitting: Podiatry

## 2023-12-29 ENCOUNTER — Ambulatory Visit (INDEPENDENT_AMBULATORY_CARE_PROVIDER_SITE_OTHER)

## 2023-12-29 ENCOUNTER — Ambulatory Visit: Admitting: Podiatry

## 2023-12-29 VITALS — Ht 70.0 in | Wt 235.0 lb

## 2023-12-29 DIAGNOSIS — M216X2 Other acquired deformities of left foot: Secondary | ICD-10-CM | POA: Diagnosis not present

## 2023-12-29 DIAGNOSIS — L97522 Non-pressure chronic ulcer of other part of left foot with fat layer exposed: Secondary | ICD-10-CM | POA: Diagnosis not present

## 2023-12-29 NOTE — Progress Notes (Signed)
  Subjective:  Patient ID: Howard Maxwell, male    DOB: 03/16/1946,  MRN: [id number]  Chief Complaint  Patient presents with   Wound Check    Rm 1 Patient is her ulcer of the left foot. Wound has a scab with some sanguineous drainage.    78 y.o. male presents with the above complaint. History confirmed with patient.  He notes some increased bleeding and blistering from it  Objective:  Physical Exam: Foot is warm with a palpable PT pulse he has a full-thickness ulcer submetatarsal 3 measuring 0.3 x 0.5 x 0.3 cm predebridement and postdebridement measuring 0.4 x 0.6 x 0.2 cm.  There is a small amount of hyperkeratosis and biofilm and slough     Segmented with a skin marker shows the ulcer is directly plantar to the metatarsal head   Assessment:   1. Ulcer of left foot with fat layer exposed (HCC)   2. Plantar flexed metatarsal, left      Plan:  Patient was evaluated and treated and all questions answered.   Ulcer left foot We again reviewed his progress his ulcer continues to wax and wane.  His radiographs show that the ulcer is deep to the third metatarsal, he has a previous healed fracture of the second metatarsal and I suspect that likely elevation of the second metatarsal has not caused transfer ulceration onto the third.  We discussed surgical treatment of this with metatarsal osteotomy.  We discussed the risk benefits and manage complications of this include not limited to pain, swelling, infection, scar, numbness which may be temporary or permanent, chronic pain, stiffness, nerve pain or damage, wound healing problems, bone healing problems including delayed or non-union.  He understands and wishes to proceed.  All questions addressed.  Informed consent signed reviewed.  Is an upcoming colonoscopy has seen his pulmonologist and cardiologist and has been cleared for surgery for this without further testing.   Surgical plan:  Procedure: - Left third metatarsal floating  osteotomy  Location: - [facility]  Anesthesia plan: - Sedation with local  Postoperative pain plan: - Tylenol  1000 mg every 6 hours, oxycodone  5 mg 1-2 tabs every 6 hours only as needed  DVT prophylaxis: - None required  WB Restrictions / DME needs: - WBAT in surgical shoe postop     Return in about 3 weeks (around 01/19/2024) for wound care.

## 2023-12-31 ENCOUNTER — Telehealth: Payer: Self-pay | Admitting: Podiatry

## 2023-12-31 NOTE — Telephone Encounter (Signed)
 Called and left message for patient to contact office to schedule surgery.

## 2024-01-03 ENCOUNTER — Telehealth: Payer: Self-pay | Admitting: Podiatry

## 2024-01-03 NOTE — Telephone Encounter (Signed)
 Patient returned call in regards to scheduling surgery and is scheduled for 10/24 per his request. Patient has confirmed receipt of surgical bag and is aware that [facility] will contact 24-48 hrs prior to surgery date with arrival time. Patient is on blood thinners and is aware he is to stop 7 days prior to surgery, he is not on any GLP1 medications.  [facility] [place] [facility] prescriptions after the surgery- pharmacy has been added to chart.

## 2024-01-03 NOTE — Telephone Encounter (Signed)
 Previous encounter entered in error- disregard. Patient has confirmed he is not on any blood thinner or GLP1 medications. Patient has confirmed receipt of surgical bag and is aware that [facility] will contact 24-48 hrs prior to surgery date with arrival time. Patient uses [facility] pharmacy in chart for primary medications.

## 2024-01-04 ENCOUNTER — Other Ambulatory Visit (HOSPITAL_COMMUNITY): Payer: Self-pay

## 2024-01-04 ENCOUNTER — Encounter: Payer: Self-pay | Admitting: Podiatry

## 2024-01-04 ENCOUNTER — Telehealth: Payer: Self-pay | Admitting: Podiatry

## 2024-01-04 ENCOUNTER — Other Ambulatory Visit: Payer: Self-pay

## 2024-01-04 NOTE — Telephone Encounter (Signed)
 DOS- 01/14/2024  METATARSAL OSTEOTOMY 2-5 LT- 71691  [employer] AD EFFECTIVE DATE- 03/24/2023  DEDUCTIBLE- N/A OOP- $3500 ACCUMULATED- $480 COINSURANCE- $275  PER CHARISSE FROM [employer], NO PRIOR AUTH IS REQUIRED FOR CPT CODE 71691. REF# CHARISSE 01/04/2024 4:01 PM

## 2024-01-05 ENCOUNTER — Telehealth (HOSPITAL_BASED_OUTPATIENT_CLINIC_OR_DEPARTMENT_OTHER): Payer: Self-pay | Admitting: *Deleted

## 2024-01-05 ENCOUNTER — Telehealth: Payer: Self-pay | Admitting: Cardiovascular Disease

## 2024-01-05 NOTE — Telephone Encounter (Signed)
 Left message to call back to schedule tele pre op appt.

## 2024-01-05 NOTE — Telephone Encounter (Signed)
 Pt has been scheduled tele preop appt 01/12/24. Request just received today. Pt cannot do tele until 01/12/24. Med rec and consent are done.

## 2024-01-05 NOTE — Telephone Encounter (Signed)
 Pt has been scheduled tele preop appt 01/12/24. Request just received today. Pt cannot do tele until 01/12/24. Med rec and consent are done.      Patient Consent for Virtual Visit        Howard Maxwell has provided verbal consent on 01/05/2024 for a virtual visit (video or telephone).   CONSENT FOR VIRTUAL VISIT FOR:  Howard Maxwell  By participating in this virtual visit I agree to the following:  I hereby voluntarily request, consent and authorize [facility] and its employed or contracted physicians, physician assistants, nurse practitioners or other licensed health care professionals (the Practitioner), to provide me with telemedicine health care services (the "Services) as deemed necessary by the treating Practitioner. I acknowledge and consent to receive the Services by the Practitioner via telemedicine. I understand that the telemedicine visit will involve communicating with the Practitioner through live audiovisual communication technology and the disclosure of certain medical information by electronic transmission. I acknowledge that I have been given the opportunity to request an in-person assessment or other available alternative prior to the telemedicine visit and am voluntarily participating in the telemedicine visit.  I understand that I have the right to withhold or withdraw my consent to the use of telemedicine in the course of my care at any time, without affecting my right to future care or treatment, and that the Practitioner or I may terminate the telemedicine visit at any time. I understand that I have the right to inspect all information obtained and/or recorded in the course of the telemedicine visit and may receive copies of available information for a reasonable fee.  I understand that some of the potential risks of receiving the Services via telemedicine include:  Delay or interruption in medical evaluation due to technological equipment failure or  disruption; Information transmitted may not be sufficient (e.g. poor resolution of images) to allow for appropriate medical decision making by the Practitioner; and/or  In rare instances, security protocols could fail, causing a breach of personal health information.  Furthermore, I acknowledge that it is my responsibility to provide information about my medical history, conditions and care that is complete and accurate to the best of my ability. I acknowledge that Practitioner's advice, recommendations, and/or decision may be based on factors not within their control, such as incomplete or inaccurate data provided by me or distortions of diagnostic images or specimens that may result from electronic transmissions. I understand that the practice of medicine is not an exact science and that Practitioner makes no warranties or guarantees regarding treatment outcomes. I acknowledge that a copy of this consent can be made available to me via my patient portal [facility] MyChart), or I can request a printed copy by calling the office of [facility].    I understand that my insurance will be billed for this visit.   I have read or had this consent read to me. I understand the contents of this consent, which adequately explains the benefits and risks of the Services being provided via telemedicine.  I have been provided ample opportunity to ask questions regarding this consent and the Services and have had my questions answered to my satisfaction. I give my informed consent for the services to be provided through the use of telemedicine in my medical care

## 2024-01-05 NOTE — Telephone Encounter (Signed)
 Pt returning call. Please advise.

## 2024-01-05 NOTE — Telephone Encounter (Signed)
   Name: Howard Maxwell  DOB: 1946/01/04  MRN: [id number]  Primary Cardiologist: Deatrice Cage, MD   Preoperative team, please contact this patient and set up a phone call appointment for further preoperative risk assessment. Please obtain consent and complete medication review. Thank you for your help.  I confirm that guidance regarding antiplatelet and oral anticoagulation therapy has been completed and, if necessary, noted below.  Patient is not on anticoagulation or antiplatelet per review of current medical record in Epic.    I also confirmed the patient resides in the state of [place] . As per [facility] Medical Board telemedicine laws, the patient must reside in the state in which the provider is licensed.   Lamarr Satterfield, NP 01/05/2024, 10:38 AM [facility]

## 2024-01-05 NOTE — Telephone Encounter (Signed)
   Pre-operative Risk Assessment    Patient Name: Howard Maxwell  DOB: 1945/10/18 MRN: [id number]   Date of last office visit: 10/21/2023 Date of next office visit: TBD   Request for Surgical Clearance    Procedure:  Left Metatarsal Osteotomy  Date of Surgery:  Clearance 01/14/24                                Surgeon:  Dr Juliene Medicine  Surgeon's Group or Practice Name:  [facility]  Phone number:  [phone] Fax number:  [phone]   Type of Clearance Requested:   - Medical    Type of Anesthesia:  General    Additional requests/questions:    [name]   01/05/2024, 10:20 AM

## 2024-01-06 ENCOUNTER — Telehealth: Payer: Self-pay | Admitting: Pulmonary Disease

## 2024-01-06 NOTE — Telephone Encounter (Signed)
 Copied from CRM [id number]. Topic: General - Other >> Jan 06, 2024 10:08 AM Lavanda D wrote: Reason for CRM: [name] with [facility] is calling to check on an update for a pulmonary clearance for his surgery coming up on 10/21, she said she has faxed this request 2x since September. Please call with an update if available, this is for his colonoscopy.   CB: [phone]   Fax is not going through- busy tone  I called [facility] and spoke with Jeanie She states that the fax number I am using is correct, but it's just been busy all day  This is unfortunately the only number they have Tried sending it through again and will wait for confirmation

## 2024-01-06 NOTE — Telephone Encounter (Signed)
 Risk assessment 12/13/23 was faxed to [facility] [phone]

## 2024-01-07 ENCOUNTER — Telehealth: Payer: Self-pay

## 2024-01-07 NOTE — Telephone Encounter (Signed)
 Copied from CRM [id number]. Topic: General - Other >> Jan 06, 2024 10:08 AM Lavanda D wrote: Reason for CRM: Howard Maxwell with [facility] is calling to check on an update for a pulmonary clearance for his surgery coming up on 10/21, she said she has faxed this request 2x since September. Please call with an update if available, this is for his colonoscopy.   CB: #[phone] >> Jan 07, 2024 11:03 AM [name] wrote: Howard Maxwell with [facility] stated she just needs to know if he is clear for this next week. Howard Maxwell stated she will be leaving at 12 today if somone would contact Maryourie at the clinic please CB: [phone]  [facility] faxed surgical clearance yesterday. I called [facility] and informed them that clearance was faxed yesterday and pt saw Candis Dandy, PA on 12-13-23. They still have not received a fax.

## 2024-01-07 NOTE — Telephone Encounter (Signed)
 Copied from CRM [id number]. Topic: General - Other >> Jan 06, 2024 10:08 AM Lavanda D wrote: Reason for CRM: [name] with [facility] is calling to check on an update for a pulmonary clearance for his surgery coming up on 10/21, she said she has faxed this request 2x since September. Please call with an update if available, this is for his colonoscopy.   CB: #[phone] >> Jan 07, 2024 11:03 AM [name] wrote: [name] with [facility] stated she just needs to know if he is clear for this next week. [name] stated she will be leaving at 12 today if somone would contact Maryourie at the clinic please CB: [phone]

## 2024-01-07 NOTE — Telephone Encounter (Signed)
 Fax with surgery clearance went through  I called and spoke with Jeanie at GI and confirmed this was received and that there is nothing further needed

## 2024-01-10 NOTE — Telephone Encounter (Signed)
 Copied from CRM [id number]. Topic: General - Other >> Jan 06, 2024 10:08 AM Lavanda D wrote: Reason for CRM: [name] with [facility] is calling to check on an update for a pulmonary clearance for his surgery coming up on 10/21, she said she has faxed this request 2x since September. Please call with an update if available, this is for his colonoscopy.   CB: #[phone] >> Jan 07, 2024  2:52 PM [name] wrote: Pt stated someone called him but did not say what the call was pertaining. Advised him that our clinic was trying to reach the GI clinic for his clearance.  >> Jan 07, 2024 11:03 AM [name] wrote: [name] with [facility] stated she just needs to know if he is clear for this next week. [name] stated she will be leaving at 12 today if somone would contact Maryourie at the clinic please CB: [phone]  Call and spoke with Jeanie, she put a note in the chart.  Received surgical clearance.  Nothing further needed.

## 2024-01-10 NOTE — Telephone Encounter (Signed)
 I confirmed that the fax was finally received  Nothing further needed

## 2024-01-11 ENCOUNTER — Ambulatory Visit
Admission: RE | Admit: 2024-01-11 | Discharge: 2024-01-11 | Disposition: A | Discharge status: Home / Self Care | Attending: Gastroenterology | Admitting: Gastroenterology

## 2024-01-11 ENCOUNTER — Encounter
Admission: RE | Disposition: A | Discharge status: Home / Self Care | Payer: Self-pay | Source: Home / Self Care | Attending: Gastroenterology

## 2024-01-11 ENCOUNTER — Telehealth: Payer: Self-pay

## 2024-01-11 ENCOUNTER — Ambulatory Visit: Payer: Self-pay

## 2024-01-11 DIAGNOSIS — R1310 Dysphagia, unspecified: Secondary | ICD-10-CM | POA: Insufficient documentation

## 2024-01-11 DIAGNOSIS — Z7984 Long term (current) use of oral hypoglycemic drugs: Secondary | ICD-10-CM | POA: Diagnosis not present

## 2024-01-11 DIAGNOSIS — Z1211 Encounter for screening for malignant neoplasm of colon: Secondary | ICD-10-CM | POA: Insufficient documentation

## 2024-01-11 DIAGNOSIS — I10 Essential (primary) hypertension: Secondary | ICD-10-CM | POA: Diagnosis not present

## 2024-01-11 DIAGNOSIS — G4733 Obstructive sleep apnea (adult) (pediatric): Secondary | ICD-10-CM | POA: Insufficient documentation

## 2024-01-11 DIAGNOSIS — K5730 Diverticulosis of large intestine without perforation or abscess without bleeding: Secondary | ICD-10-CM | POA: Insufficient documentation

## 2024-01-11 DIAGNOSIS — I25119 Atherosclerotic heart disease of native coronary artery with unspecified angina pectoris: Secondary | ICD-10-CM | POA: Insufficient documentation

## 2024-01-11 DIAGNOSIS — K640 First degree hemorrhoids: Secondary | ICD-10-CM | POA: Insufficient documentation

## 2024-01-11 DIAGNOSIS — Z87891 Personal history of nicotine dependence: Secondary | ICD-10-CM | POA: Insufficient documentation

## 2024-01-11 DIAGNOSIS — J439 Emphysema, unspecified: Secondary | ICD-10-CM | POA: Insufficient documentation

## 2024-01-11 DIAGNOSIS — Z79899 Other long term (current) drug therapy: Secondary | ICD-10-CM | POA: Insufficient documentation

## 2024-01-11 DIAGNOSIS — K5790 Diverticulosis of intestine, part unspecified, without perforation or abscess without bleeding: Secondary | ICD-10-CM | POA: Diagnosis not present

## 2024-01-11 DIAGNOSIS — E1140 Type 2 diabetes mellitus with diabetic neuropathy, unspecified: Secondary | ICD-10-CM | POA: Insufficient documentation

## 2024-01-11 DIAGNOSIS — E669 Obesity, unspecified: Secondary | ICD-10-CM | POA: Insufficient documentation

## 2024-01-11 DIAGNOSIS — I471 Supraventricular tachycardia, unspecified: Secondary | ICD-10-CM | POA: Diagnosis not present

## 2024-01-11 DIAGNOSIS — I252 Old myocardial infarction: Secondary | ICD-10-CM | POA: Diagnosis not present

## 2024-01-11 DIAGNOSIS — K649 Unspecified hemorrhoids: Secondary | ICD-10-CM | POA: Diagnosis not present

## 2024-01-11 DIAGNOSIS — D123 Benign neoplasm of transverse colon: Secondary | ICD-10-CM | POA: Diagnosis not present

## 2024-01-11 DIAGNOSIS — Z6833 Body mass index (BMI) 33.0-33.9, adult: Secondary | ICD-10-CM | POA: Insufficient documentation

## 2024-01-11 DIAGNOSIS — K219 Gastro-esophageal reflux disease without esophagitis: Secondary | ICD-10-CM | POA: Insufficient documentation

## 2024-01-11 DIAGNOSIS — I2510 Atherosclerotic heart disease of native coronary artery without angina pectoris: Secondary | ICD-10-CM | POA: Diagnosis not present

## 2024-01-11 DIAGNOSIS — K635 Polyp of colon: Secondary | ICD-10-CM | POA: Diagnosis not present

## 2024-01-11 DIAGNOSIS — Z860100 Personal history of colon polyps, unspecified: Secondary | ICD-10-CM | POA: Diagnosis not present

## 2024-01-11 LAB — GLUCOSE, CAPILLARY: Glucose-Capillary: 167 mg/dL — ABNORMAL HIGH (ref 70–99)

## 2024-01-11 SURGERY — COLONOSCOPY
Anesthesia: General

## 2024-01-11 MED ORDER — EPHEDRINE SULFATE-NACL 50-0.9 MG/10ML-% IV SOSY
PREFILLED_SYRINGE | INTRAVENOUS | Status: DC | PRN
  Administered 2024-01-11 (×2): 10 mg via INTRAVENOUS
  Administered 2024-01-11: 5 mg via INTRAVENOUS

## 2024-01-11 MED ORDER — LIDOCAINE HCL (CARDIAC) PF 100 MG/5ML IV SOSY
PREFILLED_SYRINGE | INTRAVENOUS | Status: DC | PRN
  Administered 2024-01-11: 100 mg via INTRAVENOUS

## 2024-01-11 MED ORDER — EPHEDRINE 5 MG/ML INJ
INTRAVENOUS | Status: AC
Start: 2024-01-11 — End: 2024-01-11
  Filled 2024-01-11: qty 5

## 2024-01-11 MED ORDER — PROPOFOL 10 MG/ML IV BOLUS
INTRAVENOUS | Status: DC | PRN
  Administered 2024-01-11: 30 mg via INTRAVENOUS
  Administered 2024-01-11: 150 mg via INTRAVENOUS

## 2024-01-11 MED ORDER — PROPOFOL 500 MG/50ML IV EMUL
INTRAVENOUS | Status: DC | PRN
Start: 2024-01-11 — End: 2024-01-11
  Administered 2024-01-11: 150 ug/kg/min via INTRAVENOUS

## 2024-01-11 MED ORDER — SODIUM CHLORIDE 0.9 % IV SOLN: INTRAVENOUS | Status: DC

## 2024-01-11 MED ORDER — LIDOCAINE HCL (PF) 2 % IJ SOLN
INTRAMUSCULAR | Status: AC
Start: 2024-01-11 — End: 2024-01-11
  Filled 2024-01-11: qty 5

## 2024-01-11 NOTE — Op Note (Signed)
 [facility] Patient Name: Howard Maxwell Procedure Date: 01/11/2024 8:25 AM MRN: [id number] Account #: [id number] Date of Birth: [birth year] Admit Type: Outpatient Age: 78 Room: [facility] ENDO ROOM 1 Gender: Male Note Status: Finalized Instrument Name: Colon Scope [id number] Procedure:             Colonoscopy Indications:           Surveillance: Personal history of adenomatous polyps                         on last colonoscopy > 3 years ago Providers:             Ole Schick MD, MD Referring MD:          Tamra Leventhal, MD (Referring MD) Medicines:             Monitored Anesthesia Care Complications:         No immediate complications. Estimated blood loss:                         Minimal. Procedure:             Pre-Anesthesia Assessment:                        - Prior to the procedure, a History and Physical was                         performed, and patient medications and allergies were                         reviewed. The patient is competent. The risks and                         benefits of the procedure and the sedation options and                         risks were discussed with the patient. All questions                         were answered and informed consent was obtained.                         Patient identification and proposed procedure were                         verified by the physician, the nurse, the                         anesthesiologist, the anesthetist and the technician                         in the endoscopy suite. Mental Status Examination:                         alert and oriented. Airway Examination: normal                         oropharyngeal airway and neck mobility. Respiratory  Examination: clear to auscultation. CV Examination:                         normal. Prophylactic Antibiotics: The patient does not                         require prophylactic antibiotics. Prior                          Anticoagulants: The patient has taken no anticoagulant                         or antiplatelet agents. ASA Grade Assessment: III - A                         patient with severe systemic disease. After reviewing                         the risks and benefits, the patient was deemed in                         satisfactory condition to undergo the procedure. The                         anesthesia plan was to use monitored anesthesia care                         (MAC). Immediately prior to administration of                         medications, the patient was re-assessed for adequacy                         to receive sedatives. The heart rate, respiratory                         rate, oxygen  saturations, blood pressure, adequacy of                         pulmonary ventilation, and response to care were                         monitored throughout the procedure. The physical                         status of the patient was re-assessed after the                         procedure.                        After obtaining informed consent, the colonoscope was                         passed under direct vision. Throughout the procedure,                         the patient's blood pressure, pulse, and oxygen   saturations were monitored continuously. The                         Colonoscope was introduced through the anus and                         advanced to the the cecum, identified by appendiceal                         orifice and ileocecal valve. The colonoscopy was                         somewhat difficult due to significant looping. The                         patient tolerated the procedure well. The quality of                         the bowel preparation was adequate to identify polyps.                         The ileocecal valve, appendiceal orifice, and rectum                         were photographed. Findings:      The perianal and digital rectal  examinations were normal.      Multiple large-mouthed and small-mouthed diverticula were found in the       sigmoid colon, descending colon, transverse colon and ascending colon.      Three sessile polyps were found in the transverse colon. The polyps were       2 to 7 mm in size. These polyps were removed with a cold snare.       Resection and retrieval were complete. Estimated blood loss was minimal.      Internal hemorrhoids were found during retroflexion. The hemorrhoids       were Grade I (internal hemorrhoids that do not prolapse).      The exam was otherwise without abnormality on direct and retroflexion       views. Impression:            - Diverticulosis in the sigmoid colon, in the                         descending colon, in the transverse colon and in the                         ascending colon.                        - Three 2 to 7 mm polyps in the transverse colon,                         removed with a cold snare. Resected and retrieved.                        - Internal hemorrhoids.                        - The examination was otherwise normal on direct  and                         retroflexion views. Recommendation:        - Discharge patient to home.                        - Resume previous diet.                        - Continue present medications.                        - Await pathology results.                        - Repeat colonoscopy is not recommended due to current                         age (45 years or older) for surveillance.                        - Return to referring physician as previously                         scheduled. Procedure Code(s):     --- Professional ---                        [phone], Colonoscopy, flexible; with removal of                         tumor(s), polyp(s), or other lesion(s) by snare                         technique Diagnosis Code(s):     --- Professional ---                        Z86.010, Personal history of colonic polyps                         K64.0, First degree hemorrhoids                        D12.3, Benign neoplasm of transverse colon (hepatic                         flexure or splenic flexure)                        K57.30, Diverticulosis of large intestine without                         perforation or abscess without bleeding CPT copyright 2022 American Medical Association. All rights reserved. The codes documented in this report are preliminary and upon coder review may  be revised to meet current compliance requirements. Ole Schick MD, MD 01/11/2024 9:26:52 AM Number of Addenda: 0 Note Initiated On: 01/11/2024 8:25 AM Scope Withdrawal Time: 0 hours 13 minutes 4 seconds  Total Procedure Duration: 0 hours 21 minutes 20 seconds  Estimated Blood Loss:  Estimated blood loss was minimal.      [facility]

## 2024-01-11 NOTE — Op Note (Signed)
 [facility] Patient Name: Howard Maxwell Procedure Date: 01/11/2024 8:30 AM MRN: [id number] Account #: [id number] Date of Birth: 20-Apr-1945 Admit Type: Outpatient Age: 78 Room: [facility] ENDO ROOM 1 Gender: Male Note Status: Finalized Instrument Name: Upper GI Scope [id number] Procedure:             Upper GI endoscopy Indications:           Dysphagia Providers:             Ole Schick MD, MD Referring MD:          Tamra Leventhal, MD (Referring MD) Medicines:             Monitored Anesthesia Care Complications:         No immediate complications. Procedure:             Pre-Anesthesia Assessment:                        - Prior to the procedure, a History and Physical was                         performed, and patient medications and allergies were                         reviewed. The patient is competent. The risks and                         benefits of the procedure and the sedation options and                         risks were discussed with the patient. All questions                         were answered and informed consent was obtained.                         Patient identification and proposed procedure were                         verified by the physician, the nurse, the                         anesthesiologist, the anesthetist and the technician                         in the endoscopy suite. Mental Status Examination:                         alert and oriented. Airway Examination: normal                         oropharyngeal airway and neck mobility. Respiratory                         Examination: clear to auscultation. CV Examination:                         normal. Prophylactic Antibiotics: The patient does not  require prophylactic antibiotics. Prior                         Anticoagulants: The patient has taken no anticoagulant                         or antiplatelet agents. ASA Grade Assessment: III - A                          patient with severe systemic disease. After reviewing                         the risks and benefits, the patient was deemed in                         satisfactory condition to undergo the procedure. The                         anesthesia plan was to use monitored anesthesia care                         (MAC). Immediately prior to administration of                         medications, the patient was re-assessed for adequacy                         to receive sedatives. The heart rate, respiratory                         rate, oxygen  saturations, blood pressure, adequacy of                         pulmonary ventilation, and response to care were                         monitored throughout the procedure. The physical                         status of the patient was re-assessed after the                         procedure.                        After obtaining informed consent, the endoscope was                         passed under direct vision. Throughout the procedure,                         the patient's blood pressure, pulse, and oxygen                          saturations were monitored continuously. The Endoscope                         was introduced through the mouth, and advanced to the  second part of duodenum. The upper GI endoscopy was                         accomplished without difficulty. The patient tolerated                         the procedure well. Findings:      The examined esophagus was normal.      The entire examined stomach was normal.      The examined duodenum was normal. Impression:            - Normal esophagus.                        - Normal stomach.                        - Normal examined duodenum.                        - No specimens collected. Recommendation:        - Discharge patient to home.                        - Resume previous diet.                        - Continue present medications.                         - Return to referring physician as previously                         scheduled. Procedure Code(s):     --- Professional ---                        [phone], Esophagogastroduodenoscopy, flexible,                         transoral; diagnostic, including collection of                         specimen(s) by brushing or washing, when performed                         (separate procedure) Diagnosis Code(s):     --- Professional ---                        R13.10, Dysphagia, unspecified CPT copyright 2022 American Medical Association. All rights reserved. The codes documented in this report are preliminary and upon coder review may  be revised to meet current compliance requirements. Ole Schick MD, MD 01/11/2024 9:23:45 AM Number of Addenda: 0 Note Initiated On: 01/11/2024 8:30 AM Estimated Blood Loss:  Estimated blood loss: none.      [facility]
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
## 2024-01-11 NOTE — Interval H&P Note (Signed)
 History and Physical Interval Note:  01/11/2024 8:43 AM  Howard Maxwell  has presented today for surgery, with the diagnosis of History of colon polyps [Z86.0100]   GERD without esophagitis [K21.9] Dysphagia, unspecified type [R13.10].  The various methods of treatment have been discussed with the patient and family. After consideration of risks, benefits and other options for treatment, the patient has consented to  Procedure(s) with comments: COLONOSCOPY (N/A) - DM, Wears Oxygen  EGD (ESOPHAGOGASTRODUODENOSCOPY) (N/A) as a surgical intervention.  The patient's history has been reviewed, patient examined, no change in status, stable for surgery.  I have reviewed the patient's chart and labs.  Questions were answered to the patient's satisfaction.     [name]  Ok to proceed with EGD/Colonoscopy

## 2024-01-11 NOTE — Telephone Encounter (Signed)
 Called and spoke to patient - we canceled nail trim for Wednesday. He mentioned that he checked with his insurance - they will reimburse him for the boot, he just needs a letter of medical necessity. Assured him that we can do that. thanks
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
## 2024-01-11 NOTE — Anesthesia Postprocedure Evaluation (Signed)
 Anesthesia Post Note  Patient: Howard Maxwell  Procedure(s) Performed: COLONOSCOPY EGD (ESOPHAGOGASTRODUODENOSCOPY) POLYPECTOMY, INTESTINE  Patient location during evaluation: PACU Anesthesia Type: General Level of consciousness: awake and alert Pain management: pain level controlled Vital Signs Assessment: post-procedure vital signs reviewed and stable Respiratory status: spontaneous breathing, nonlabored ventilation, respiratory function stable and patient connected to nasal cannula oxygen  Cardiovascular status: blood pressure returned to baseline and stable Postop Assessment: no apparent nausea or vomiting Anesthetic complications: no   No notable events documented.   Last Vitals:  Vitals:   01/11/24 0935 01/11/24 0945  BP: (!) 103/55 121/77  Pulse: 73 (!) 57  Resp: 15 15  Temp:    SpO2: 100% 95%    Last Pain:  Vitals:   01/11/24 0945  TempSrc:   PainSc: 0-No pain                 Howard Maxwell

## 2024-01-11 NOTE — Transfer of Care (Signed)
 Immediate Anesthesia Transfer of Care Note  Patient: Howard Maxwell  Procedure(s) Performed: COLONOSCOPY EGD (ESOPHAGOGASTRODUODENOSCOPY) POLYPECTOMY, INTESTINE  Patient Location: Endoscopy Unit  Anesthesia Type:General  Level of Consciousness: sedated and drowsy  Airway & Oxygen  Therapy: Patient Spontanous Breathing and Supernova O2 delivery 10lpm  Post-op Assessment: Report given to RN and Post -op Vital signs reviewed and stable  Post vital signs: Reviewed and stable  Last Vitals:  Vitals Value Taken Time  BP 109/68 01/11/24 09:25  Temp 35.6 C 01/11/24 09:25  Pulse 71 01/11/24 09:30  Resp 13 01/11/24 09:30  SpO2 100 % 01/11/24 09:30  Vitals shown include unfiled device data.  Last Pain:  Vitals:   01/11/24 0925  TempSrc: Temporal  PainSc: 0-No pain         Complications: No notable events documented.

## 2024-01-12 ENCOUNTER — Ambulatory Visit: Admitting: Podiatry

## 2024-01-12 ENCOUNTER — Ambulatory Visit: Attending: Cardiology | Admitting: Nurse Practitioner

## 2024-01-12 DIAGNOSIS — Z01810 Encounter for preprocedural cardiovascular examination: Secondary | ICD-10-CM | POA: Diagnosis not present
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
## 2024-01-13 ENCOUNTER — Other Ambulatory Visit: Payer: Self-pay | Admitting: Podiatry

## 2024-01-13 LAB — SURGICAL PATHOLOGY

## 2024-01-13 MED ORDER — OXYCODONE HCL 5 MG PO TABS: 5.0000 mg | ORAL_TABLET | ORAL | 0 refills | Status: AC | PRN

## 2024-01-13 MED ORDER — ACETAMINOPHEN 500 MG PO TABS: 1000.0000 mg | ORAL_TABLET | Freq: Four times a day (QID) | ORAL | 0 refills | Status: AC | PRN

## 2024-01-13 MED ORDER — CEPHALEXIN 500 MG PO CAPS: 500.0000 mg | ORAL_CAPSULE | Freq: Three times a day (TID) | ORAL | 0 refills | Status: DC

## 2024-01-14 DIAGNOSIS — M7742 Metatarsalgia, left foot: Secondary | ICD-10-CM | POA: Diagnosis not present

## 2024-01-14 DIAGNOSIS — M21542 Acquired clubfoot, left foot: Secondary | ICD-10-CM | POA: Diagnosis not present

## 2024-01-19 ENCOUNTER — Ambulatory Visit (INDEPENDENT_AMBULATORY_CARE_PROVIDER_SITE_OTHER)

## 2024-01-19 ENCOUNTER — Ambulatory Visit: Admitting: Podiatry

## 2024-01-19 VITALS — Ht 70.0 in | Wt 235.0 lb

## 2024-01-19 DIAGNOSIS — L97522 Non-pressure chronic ulcer of other part of left foot with fat layer exposed: Secondary | ICD-10-CM

## 2024-01-19 DIAGNOSIS — M79676 Pain in unspecified toe(s): Secondary | ICD-10-CM | POA: Diagnosis not present

## 2024-01-19 DIAGNOSIS — B351 Tinea unguium: Secondary | ICD-10-CM

## 2024-01-19 DIAGNOSIS — M216X2 Other acquired deformities of left foot: Secondary | ICD-10-CM

## 2024-01-20 ENCOUNTER — Other Ambulatory Visit (HOSPITAL_COMMUNITY): Payer: Self-pay

## 2024-01-20 NOTE — Progress Notes (Signed)
  Subjective:  Patient ID: Howard Maxwell, male    DOB: 03/17/1946,  MRN: [id number]  Chief Complaint  Patient presents with   Post-op Follow-up    RM 1 POST OP - RM 1 POV #1 DOS 01/14/2024 LT METATARSAL OSTEOTOMY 2-5. Suture intact mild swelling.     78 y.o. male returns for post-op check.  His nails are thickened elongated and causing pain and discomfort as well, due to his colonoscopy and surgery last week had to reschedule his appoint with Dr. Verta for this  Review of Systems: Negative except as noted in the HPI. Denies N/V/F/Ch.   Objective:  There were no vitals filed for this visit. Body mass index is 33.72 kg/m. Constitutional Well developed. Well nourished.  Vascular Foot warm and well perfused. Capillary refill normal to all digits.  Calf is soft and supple, no posterior calf or knee pain, negative Homans' sign  Neurologic Normal speech. Oriented to person, place, and time. Epicritic sensation to light touch grossly present bilaterally.  Dermatologic Skin healing well without signs of infection. Skin edges well coapted without signs of infection.  Thickened elongated mycotic dystrophic nails x 10  Orthopedic: Tenderness to palpation noted about the surgical site.   Multiple view plain film radiographs: Left third metatarsal osteotomy with good correction and position noted Assessment:   1. Plantar flexed metatarsal, left   2. Pain due to onychomycosis of toenail    Plan:  Patient was evaluated and treated and all questions answered.  S/p foot surgery left -Progressing as expected post-operatively. -XR: Noted above no issues -WB Status: Full weightbearing as tolerated in CAM boot -Sutures: Return in 2 weeks to remove. -Medications: No refills required -Foot redressed.  Okay to shower and bathe foot.  May just apply Band-Aid and Neosporin to this and plantar site which has healed   Discussed the etiology and treatment options for the condition in detail with the  patient. Recommended debridement of the nails today. Sharp and mechanical debridement performed of all painful and mycotic nails today. Nails debrided in length and thickness using a nail nipper to level of comfort. Follow up as needed for painful nails.    No follow-ups on file.

## 2024-02-01 ENCOUNTER — Other Ambulatory Visit (HOSPITAL_COMMUNITY): Payer: Self-pay

## 2024-02-01 ENCOUNTER — Other Ambulatory Visit: Payer: Self-pay

## 2024-02-02 ENCOUNTER — Ambulatory Visit (INDEPENDENT_AMBULATORY_CARE_PROVIDER_SITE_OTHER)

## 2024-02-02 ENCOUNTER — Ambulatory Visit: Admitting: Podiatry

## 2024-02-02 DIAGNOSIS — M216X2 Other acquired deformities of left foot: Secondary | ICD-10-CM | POA: Diagnosis not present

## 2024-02-04 ENCOUNTER — Encounter: Payer: Self-pay | Admitting: Podiatry

## 2024-02-04 NOTE — Progress Notes (Signed)
  Subjective:  Patient ID: Howard Maxwell, male    DOB: 1945-05-14,  MRN: [id number]  Chief Complaint  Patient presents with   Post-op Follow-up    Rm 3 POV #2 DOS 01/14/2024 LT METATARSAL OSTEOTOMY 2-5. Patient needs letter of medical necessity for walking boot received on 11/24/23. Pt states some pain on the bottom lateral side of left foot when walking.    78 y.o. male returns for post-op check.  Doing well pain is minimal  Review of Systems: Negative except as noted in the HPI. Denies N/V/F/Ch.   Objective:  There were no vitals filed for this visit. There is no height or weight on file to calculate BMI. Constitutional Well developed. Well nourished.  Vascular Foot warm and well perfused. Capillary refill normal to all digits.  Calf is soft and supple, no posterior calf or knee pain, negative Homans' sign  Neurologic Normal speech. Oriented to person, place, and time. Epicritic sensation to light touch grossly present bilaterally.  Dermatologic Skin is well-healed not hypertrophic  Orthopedic: Tenderness to palpation noted about the surgical site.   Multiple view plain film radiographs: Left third metatarsal osteotomy with good correction and position noted, no change in alignment Assessment:   1. Plantar flexed metatarsal, left    Plan:  Patient was evaluated and treated and all questions answered.  S/p foot surgery left - Doing well sutures removed continue weightbearing as tolerated in surgical shoe.  Follow-up in 3 weeks for new radiographs hopefully should go to transition back to shoe gear at this point   No follow-ups on file.

## 2024-02-21 ENCOUNTER — Other Ambulatory Visit (HOSPITAL_COMMUNITY): Payer: Self-pay

## 2024-02-21 ENCOUNTER — Other Ambulatory Visit: Payer: Self-pay

## 2024-02-21 MED ORDER — EMPAGLIFLOZIN 10 MG PO TABS
10.0000 mg | ORAL_TABLET | Freq: Every day | ORAL | 2 refills | Status: AC
  Filled 2024-02-21: qty 90, 90d supply, fill #0

## 2024-02-23 ENCOUNTER — Ambulatory Visit

## 2024-02-23 ENCOUNTER — Ambulatory Visit: Admitting: Podiatry

## 2024-02-23 DIAGNOSIS — M216X2 Other acquired deformities of left foot: Secondary | ICD-10-CM | POA: Diagnosis not present

## 2024-02-24 ENCOUNTER — Ambulatory Visit (HOSPITAL_BASED_OUTPATIENT_CLINIC_OR_DEPARTMENT_OTHER): Admitting: Pulmonary Disease

## 2024-02-24 NOTE — Progress Notes (Signed)
  Subjective:  Patient ID: Howard Maxwell, male    DOB: [birth year],  MRN: [id number]  Chief Complaint  Patient presents with   Routine Post Op    POV #3 DOS 01/14/2024 LT METATARSAL OSTEOTOMY 2-5. He is super pleased with the healing process    78 y.o. male returns for post-op check.  Doing well pain is minimal  Review of Systems: Negative except as noted in the HPI. Denies N/V/F/Ch.   Objective:  There were no vitals filed for this visit. There is no height or weight on file to calculate BMI. Constitutional Well developed. Well nourished.  Vascular Foot warm and well perfused. Capillary refill normal to all digits.  Calf is soft and supple, no posterior calf or knee pain, negative Homans' sign  Neurologic Normal speech. Oriented to person, place, and time. Epicritic sensation to light touch grossly present bilaterally.  Dermatologic Skin is well-healed not hypertrophic.  Callus is healing well  Orthopedic: Tenderness to palpation noted about the surgical site.   Multiple view plain film radiographs: Left third metatarsal osteotomy with good correction and position noted, no change in alignment, so far not much bridging or callus formation Assessment:   1. Plantar flexed metatarsal, left    Plan:  Patient was evaluated and treated and all questions answered.  S/p foot surgery left - Overall doing well.  He may transition back to a regular supportive shoe as tolerated.  Reevaluate in 2 months.  The overlying callus was debrided and his ulceration has improved greatly and resolved.   Return in about 2 months (around 04/25/2024) for surgery f/u (new xrays).

## 2024-02-27 ENCOUNTER — Other Ambulatory Visit (HOSPITAL_COMMUNITY): Payer: Self-pay

## 2024-02-28 ENCOUNTER — Other Ambulatory Visit: Payer: Self-pay

## 2024-02-28 MED ORDER — ATORVASTATIN CALCIUM 40 MG PO TABS
40.0000 mg | ORAL_TABLET | Freq: Every day | ORAL | 1 refills | Status: AC
  Filled 2024-02-28: qty 90, 90d supply, fill #0

## 2024-03-01 ENCOUNTER — Encounter: Admitting: Podiatry

## 2024-03-01 DIAGNOSIS — I2510 Atherosclerotic heart disease of native coronary artery without angina pectoris: Secondary | ICD-10-CM | POA: Diagnosis not present

## 2024-03-01 DIAGNOSIS — E119 Type 2 diabetes mellitus without complications: Secondary | ICD-10-CM | POA: Diagnosis not present

## 2024-03-01 DIAGNOSIS — I10 Essential (primary) hypertension: Secondary | ICD-10-CM | POA: Diagnosis not present

## 2024-03-01 DIAGNOSIS — G4700 Insomnia, unspecified: Secondary | ICD-10-CM | POA: Diagnosis not present

## 2024-03-01 DIAGNOSIS — Z9981 Dependence on supplemental oxygen: Secondary | ICD-10-CM | POA: Diagnosis not present

## 2024-03-01 DIAGNOSIS — J449 Chronic obstructive pulmonary disease, unspecified: Secondary | ICD-10-CM | POA: Diagnosis not present

## 2024-03-01 DIAGNOSIS — G4733 Obstructive sleep apnea (adult) (pediatric): Secondary | ICD-10-CM | POA: Diagnosis not present

## 2024-03-09 ENCOUNTER — Other Ambulatory Visit (HOSPITAL_COMMUNITY): Payer: Self-pay

## 2024-03-16 ENCOUNTER — Other Ambulatory Visit (HOSPITAL_COMMUNITY): Payer: Self-pay

## 2024-03-17 ENCOUNTER — Other Ambulatory Visit (HOSPITAL_COMMUNITY): Payer: Self-pay

## 2024-03-29 ENCOUNTER — Other Ambulatory Visit: Payer: Self-pay | Admitting: Internal Medicine

## 2024-03-29 DIAGNOSIS — G8929 Other chronic pain: Secondary | ICD-10-CM

## 2024-03-29 DIAGNOSIS — C3491 Malignant neoplasm of unspecified part of right bronchus or lung: Secondary | ICD-10-CM

## 2024-03-29 DIAGNOSIS — M4722 Other spondylosis with radiculopathy, cervical region: Secondary | ICD-10-CM

## 2024-03-30 ENCOUNTER — Other Ambulatory Visit: Payer: Self-pay

## 2024-04-05 ENCOUNTER — Ambulatory Visit
Admission: RE | Admit: 2024-04-05 | Discharge: 2024-04-05 | Disposition: A | Discharge status: Home / Self Care | Source: Ambulatory Visit | Attending: Internal Medicine | Admitting: Internal Medicine

## 2024-04-05 DIAGNOSIS — M4722 Other spondylosis with radiculopathy, cervical region: Secondary | ICD-10-CM

## 2024-04-05 DIAGNOSIS — I999 Unspecified disorder of circulatory system: Secondary | ICD-10-CM | POA: Insufficient documentation

## 2024-04-05 DIAGNOSIS — J90 Pleural effusion, not elsewhere classified: Secondary | ICD-10-CM | POA: Diagnosis not present

## 2024-04-05 DIAGNOSIS — C3490 Malignant neoplasm of unspecified part of unspecified bronchus or lung: Secondary | ICD-10-CM | POA: Insufficient documentation

## 2024-04-05 DIAGNOSIS — G8929 Other chronic pain: Secondary | ICD-10-CM

## 2024-04-05 DIAGNOSIS — C3491 Malignant neoplasm of unspecified part of right bronchus or lung: Secondary | ICD-10-CM

## 2024-04-05 LAB — GLUCOSE, CAPILLARY: Glucose-Capillary: 112 mg/dL — ABNORMAL HIGH (ref 70–99)

## 2024-04-05 MED ORDER — FLUDEOXYGLUCOSE F - 18 (FDG) INJECTION
12.0000 | Freq: Once | INTRAVENOUS | Status: AC | PRN
  Administered 2024-04-05: 12.39 via INTRAVENOUS

## 2024-04-22 ENCOUNTER — Other Ambulatory Visit (HOSPITAL_COMMUNITY): Payer: Self-pay

## 2024-04-26 ENCOUNTER — Encounter: Payer: Self-pay | Admitting: Podiatry

## 2024-04-26 ENCOUNTER — Ambulatory Visit: Admitting: Podiatry

## 2024-04-26 ENCOUNTER — Ambulatory Visit

## 2024-04-26 ENCOUNTER — Telehealth: Payer: Self-pay

## 2024-04-26 VITALS — Ht 70.0 in | Wt 235.0 lb

## 2024-04-26 DIAGNOSIS — B351 Tinea unguium: Secondary | ICD-10-CM

## 2024-04-26 DIAGNOSIS — M216X2 Other acquired deformities of left foot: Secondary | ICD-10-CM

## 2024-04-26 DIAGNOSIS — E1142 Type 2 diabetes mellitus with diabetic polyneuropathy: Secondary | ICD-10-CM

## 2024-04-26 DIAGNOSIS — Z8631 Personal history of diabetic foot ulcer: Secondary | ICD-10-CM

## 2024-04-26 NOTE — Telephone Encounter (Signed)
 Pt called to inquire if he needs a CT scan before his next appt with MD.  States he had recent PET scan.  Dr Sherrod made aware.

## 2024-04-26 NOTE — Progress Notes (Signed)
"  °  Subjective:  Patient ID: Howard Maxwell, male    DOB: 12-22-1945,  MRN: [id number]  Chief Complaint  Patient presents with   Post-op Follow-up    RM 2 Patient is here for a post-op (lt metatarsal osteotomy) f/u. Pt states great improvement in pain and discomfort post-surgery. Pt is requesting a referral for diabetic shoes and a nail trim today.    79 y.o. male returns for post-op check.  Doing well has not had any pain in his foot at the surgical site or underneath.  Has not had any recurrence of ulceration.  His nails are thickened elongated causing pain and discomfort in shoe gear.  He would like to have new diabetic shoes made.  Review of Systems: Negative except as noted in the HPI. Denies N/V/F/Ch.   Objective:  There were no vitals filed for this visit. Body mass index is 33.72 kg/m. Constitutional Well developed. Well nourished.  Vascular Foot warm and well perfused. Capillary refill normal to all digits.  Calf is soft and supple, no posterior calf or knee pain, negative Homans' sign  Neurologic Normal speech. Oriented to person, place, and time. Epicritic sensation to light touch grossly present bilaterally.  Dermatologic Skin is well-healed not hypertrophic.  No recurrence of ulceration.  Mild diffuse callus bilaterally on the metatarsal forefoot.  Thickened elongated nails x 10  Orthopedic: No pain to palpation.  No edema.   Multiple view plain film radiographs: Left third metatarsal osteotomy with good correction and position noted, good early bone callus formation Assessment:   1. Plantar flexed metatarsal, left   2. History of diabetic ulcer of foot   3. Type 2 diabetes mellitus with diabetic polyneuropathy, without long-term current use of insulin  (HCC)   4. Onychomycosis    Plan:  Patient was evaluated and treated and all questions answered.  S/p foot surgery left - Regarding his surgical site and previous ulcer history he is doing well and his osteotomy is  nearly fully healed.  We discussed full bone maturation of the callus likely will take nearly a year for this.  Continue regular shoe gear as tolerated.  He is due for new diabetic extra-depth shoes as well as multidensity insoles.  Prescription written for this and given to the patient to have filled at [facility] medical supply.  He will take the medical necessity form to his PCP for completion.  He may follow-up with me as needed for his left foot surgery as well as any other issues that develop.  Discussed the etiology and treatment options for the condition in detail with the patient. Recommended debridement of the nails today. Sharp and mechanical debridement performed of all painful and mycotic nails today. Nails debrided in length and thickness using a nail nipper to level of comfort. Follow up as needed for painful nails.   Return in about 3 months (around 07/24/2024) for at risk diabetic foot care.  "

## 2024-04-28 ENCOUNTER — Ambulatory Visit: Admitting: Cardiology

## 2024-04-28 ENCOUNTER — Encounter: Payer: Self-pay | Admitting: Cardiology

## 2024-04-28 ENCOUNTER — Other Ambulatory Visit (HOSPITAL_COMMUNITY): Payer: Self-pay

## 2024-04-28 VITALS — BP 126/70 | HR 89 | Ht 70.0 in | Wt 227.6 lb

## 2024-04-28 DIAGNOSIS — G4733 Obstructive sleep apnea (adult) (pediatric): Secondary | ICD-10-CM

## 2024-04-28 DIAGNOSIS — R0609 Other forms of dyspnea: Secondary | ICD-10-CM

## 2024-04-28 DIAGNOSIS — C3491 Malignant neoplasm of unspecified part of right bronchus or lung: Secondary | ICD-10-CM

## 2024-04-28 DIAGNOSIS — Z902 Acquired absence of lung [part of]: Secondary | ICD-10-CM

## 2024-04-28 DIAGNOSIS — I2510 Atherosclerotic heart disease of native coronary artery without angina pectoris: Secondary | ICD-10-CM

## 2024-04-28 DIAGNOSIS — I471 Supraventricular tachycardia, unspecified: Secondary | ICD-10-CM

## 2024-04-28 DIAGNOSIS — E782 Mixed hyperlipidemia: Secondary | ICD-10-CM

## 2024-04-28 DIAGNOSIS — I10 Essential (primary) hypertension: Secondary | ICD-10-CM

## 2024-04-28 MED ORDER — LOSARTAN POTASSIUM 50 MG PO TABS: 50.0000 mg | ORAL_TABLET | Freq: Every day | ORAL | Status: AC

## 2024-04-28 NOTE — Progress Notes (Signed)
 " Cardiology Office Note   Date:  04/28/2024  ID:  Howard Maxwell, DOB October 09, 1945, MRN [id number] PCP: Sadie Manna, MD  [facility] Providers Cardiologist:  Deatrice Cage, MD Cardiology APP:  Gerard Frederick, NP     History of Present Illness Howard Maxwell is a 79 y.o. 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   Patient presented September 2015 with unstable angina.  Cardiac cath showed an occluded mid RCA which was medium in size and codominant with faint collaterals.  Normal ejection fraction.  Patient was treated medically.  Patient quit smoking at this time.  During cardiac catheterization, he had an episode of suspected SVT.  Patient has a history of severe iron deficiency anemia in 2015 due to GI blood loss. He had implantation of hypoglossal nerve stimulator for sleep apnea in April 2022.  He had an echocardiogram done in May 2022 which showed normal LVSF with no significant valvular abnormalities.  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  He was seen in clinic 02/11/2023 at that time was doing well with cardiac perspective.  Blood pressure been well-controlled.  He was also needing knee replacement and dental surgery.  There were no changes made to his medication or further testing that was ordered at that time.  He was seen in clinic 05/22/2023 stating he was doing well from a cardiac perspective. He says his arthritis is limiting his function. He had also had increased amount of urination with spironolactone  but placed on Myrbetriq  tablets overactive bladder and had questions, help spironolactone . Spironolactone  dosing was decreased from 25 mg daily to 12.5 mg daily was scheduled for an updated echocardiogram.   He was last seen in clinic 10/11/2023 stating that from the cardiac perspective  he had been doing well.  He had been having issues with his arthritis.  He states he been comfortable compliant with his current medication regimen.  Denies any hospitalization or visits to the emergency department.  No changes made to his medication regimen and no further testing that was needed.   He returns to clinic today stating overall for the [facility] perspective he has been doing well.  He continues to complain of worsening arthritis into the right hip.  Has previously 6 weeks of physical therapy to help with potential bursitis with limited improvement.  Previously he was cleared to have a knee surgery but due to his hip pain surgery has not been deferred at this time.  He self discontinued spironolactone .  Stating that with his overactive bladder it was too much diuretic.  He continues to state he has been compliant with remainder of his medication regimen without any undue side effects.  Denies any hospitalization or visits to the emergency department.  ROS: 10 point review of system has been reviewed and considered negative exception was been listed in the HPI  Studies Reviewed EKG Interpretation Date/Time:  Friday April 28 2024 10:49:17 EST Ventricular Rate:  89 PR Interval:  338 QRS Duration:  86 QT Interval:  332 QTC Calculation: 403 R Axis:   -36  Text Interpretation: Sinus rhythm with 1st degree A-V block Left axis deviation When compared with ECG of 21-Oct-2023 11:29, Confirmed by Gerard Frederick ([id number]) on 04/28/2024 10:54:33 AM    2D echo 06/22/2023 1. Left ventricular ejection fraction, by estimation, is 60 to 65%. The  left ventricle has normal function. The left ventricle has no regional  wall motion abnormalities. Left ventricular diastolic parameters are  consistent with Grade I diastolic  dysfunction (impaired relaxation). The average left ventricular global  longitudinal strain is -17.5 %. The global longitudinal strain is normal.   2. Right ventricular systolic function  is normal. The right ventricular  size is normal.   3. The mitral valve is normal in structure. Mild mitral valve  regurgitation.   4. The aortic valve is tricuspid. Aortic valve regurgitation is not  visualized.   5. Aortic dilatation noted. There is mild dilatation of the ascending  aorta, measuring 40 mm.   6. The inferior vena cava is normal in size with greater than 50%  respiratory variability, suggesting right atrial pressure of 3 mmHg.    Myoview  lexiscan  03/2022   The study is normal. The study is low risk.   No ST deviation was noted.   Left ventricular function is normal. Nuclear stress EF: 68 %. The left ventricular ejection fraction is hyperdynamic (>65%). End diastolic cavity size is mildly enlarged.   Prior study not available for comparison.   Heart monitor 11/2021 Patch Wear Time:  0 days and 19 hours (2023-08-31T14:18:25-0400 to 2023-09-01T09:44:59-0400)   Patient had a min HR of 50 bpm, max HR of 108 bpm, and avg HR of 74 bpm. Predominant underlying rhythm was Sinus Rhythm. First Degree AV Block was present.  Rare PACs and rare PVCs.   Echo 07/2020 1. Left ventricular ejection fraction, by estimation, is 55 to 60%. The  left ventricle has normal function. The left ventricle has no regional  wall motion abnormalities. Left ventricular diastolic parameters were  normal.   2. Right ventricular systolic function is normal. The right ventricular  size is normal.   3. The mitral valve is normal in structure. No evidence of mitral valve  regurgitation.   4. The aortic valve is tricuspid. Aortic valve regurgitation is not  visualized.   5. Aortic dilatation noted. There is mild dilatation of the ascending  aorta, measuring 40 mm.   6. The inferior vena cava is normal in size with greater than 50%  respiratory variability, suggesting right atrial pressure of 3 mmHg.  Risk Assessment/Calculations           Physical Exam VS:  BP 126/70 (BP Location: Left Arm, Patient  Position: Sitting, Cuff Size: Normal)   Pulse 89   Ht 5' 10 (1.778 m)   Wt 227 lb 9.6 oz (103.2 kg)   SpO2 97%   BMI 32.66 kg/m        Wt Readings from Last 3 Encounters:  04/28/24 227 lb 9.6 oz (103.2 kg)  04/26/24 235 lb (106.6 kg)  01/19/24 235 lb (106.6 kg)    GEN: Well nourished, well developed in no acute distress NECK: No JVD; No carotid bruits CARDIAC: RRR, no murmurs, rubs, gallops RESPIRATORY:  Clear to auscultation without rales, wheezing or rhonchi  ABDOMEN: Soft, non-tender, non-distended EXTREMITIES: Trace pretibial edema; No deformity   ASSESSMENT AND PLAN Coronary artery disease of native coronary arteries without angina.  Prior left heart catheterization was completed in 2015 which showed occluded mid RCA with faint collaterals (ischemic evaluation 1/25 Lexiscan  Myoview  was considered low risk).  EKG today reveals sinus rhythm with first-degree AV block with rate of 89 and left axis deviation with no acute ischemic changes noted.  He has been continued on aspirin  81 mg daily and atorvastatin  40 mg daily.  No  further ischemic evaluation needed at this time.  Chronic dyspnea on exertion that has remained stable likely secondary to sedentary lifestyle.  Prior echocardiogram recently completed revealed LVEF of 60 to 65%, no RWMA, G1 DD, mild mitral regurgitation with mild dilatation of the ascending aorta measuring 40 mm.  He denies any change in breathing or worsening shortness of breath.  Will need to continue with surveillance studies for mild dilatation of the ascending aorta.  Primary hypertension with a blood pressure today of 126/70.  Blood pressure remained stable.  He is continued on carvedilol  6.25 mg twice daily, losartan  50 mg daily.  He has been encouraged to continue to monitor pressures 1 to 2 hours postmedication administration at home as well.  Mixed hyperlipidemia with last LDL 39 which remained at goal.  He was continued on atorvastatin  40 mg  daily.  Paroxysmal SVT that has been quiescent.  He is continued on carvedilol  625 mg twice daily.  Obstructive sleep apnea status post inspire implant.  Lung cancer status post lobectomy and COPD on chronic oxygen  3 L O2 via nasal cannula at bedtime.  COPD currently not in acute exacerbation.  Ongoing management per pulmonary.       Dispo: Patient to return to clinic to see MD/APP in 6 months or sooner if needed for further evaluation.  Signed, Swathi Dauphin, NP   "

## 2024-04-28 NOTE — Patient Instructions (Signed)
 Medication Instructions:   Your physician recommends the following medication changes.  STOP TAKING: Losartan  50 mg Spironolactone  25 mg  *If you need a refill on your cardiac medications before your next appointment, please call your pharmacy*  Lab Work:  None ordered at this time   If you have labs (blood work) drawn today and your tests are completely normal, you will receive your results only by:  MyChart Message (if you have MyChart) OR  A paper copy in the mail If you have any lab test that is abnormal or we need to change your treatment, we will call you to review the results.  Testing/Procedures:  None ordered at this time   Referrals:  None ordered at this time   Follow-Up:  At [facility], you and your health needs are our priority.  As part of our continuing mission to provide you with exceptional heart care, our providers are all part of one team.  This team includes your primary Cardiologist (physician) and Advanced Practice Providers or APPs (Physician Assistants and Nurse Practitioners) who all work together to provide you with the care you need, when you need it.  Your next appointment:   5 - 6 month(s)  Provider:    Deatrice Cage, MD or Tylene Lunch, NP    We recommend signing up for the patient portal called MyChart.  Sign up information is provided on this After Visit Summary.  MyChart is used to connect with patients for Virtual Visits (Telemedicine).  Patients are able to view lab/test results, encounter notes, upcoming appointments, etc.  Non-urgent messages can be sent to your provider as well.   To learn more about what you can do with MyChart, go to [email or web address].

## 2024-05-09 ENCOUNTER — Ambulatory Visit (HOSPITAL_COMMUNITY)

## 2024-05-09 ENCOUNTER — Other Ambulatory Visit

## 2024-05-16 ENCOUNTER — Ambulatory Visit: Admitting: Internal Medicine

## 2024-07-24 ENCOUNTER — Ambulatory Visit: Admitting: Podiatry

## 2024-09-12 ENCOUNTER — Ambulatory Visit: Admitting: Cardiology

## 2024-10-26 ENCOUNTER — Ambulatory Visit: Admitting: Urology
